# Patient Record
Sex: Male | Born: 1955
Health system: Southern US, Community
[De-identification: ages and names within clinical notes are randomized; demographics above are authoritative.]

## PROBLEM LIST (undated history)

## (undated) DIAGNOSIS — E785 Hyperlipidemia, unspecified: Secondary | ICD-10-CM

## (undated) DIAGNOSIS — F1020 Alcohol dependence, uncomplicated: Secondary | ICD-10-CM

## (undated) DIAGNOSIS — I10 Essential (primary) hypertension: Secondary | ICD-10-CM

## (undated) DIAGNOSIS — T8459XA Infection and inflammatory reaction due to other internal joint prosthesis, initial encounter: Secondary | ICD-10-CM

## (undated) DIAGNOSIS — I5022 Chronic systolic (congestive) heart failure: Secondary | ICD-10-CM

## (undated) DIAGNOSIS — M1990 Unspecified osteoarthritis, unspecified site: Secondary | ICD-10-CM

## (undated) DIAGNOSIS — Z96619 Presence of unspecified artificial shoulder joint: Secondary | ICD-10-CM

## (undated) DIAGNOSIS — J189 Pneumonia, unspecified organism: Secondary | ICD-10-CM

## (undated) DIAGNOSIS — H269 Unspecified cataract: Secondary | ICD-10-CM

## (undated) DIAGNOSIS — Z87442 Personal history of urinary calculi: Secondary | ICD-10-CM

## (undated) HISTORY — PX: COLONOSCOPY: SHX174

## (undated) HISTORY — PX: ROTATOR CUFF REPAIR: SHX139

## (undated) HISTORY — PX: TONSILLECTOMY: SUR1361

## (undated) HISTORY — PX: UMBILICAL HERNIA REPAIR: SHX196

## (undated) HISTORY — PX: TOTAL SHOULDER REPLACEMENT: SUR1217

## (undated) HISTORY — DX: Hyperlipidemia, unspecified: E78.5

## (undated) HISTORY — DX: Unspecified osteoarthritis, unspecified site: M19.90

## (undated) HISTORY — DX: Unspecified cataract: H26.9

---
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
## 1973-04-05 HISTORY — PX: KNEE SURGERY: SHX244

## 1988-04-05 HISTORY — PX: HERNIA REPAIR: SHX51

## 1997-12-06 ENCOUNTER — Encounter: Payer: Self-pay | Admitting: Emergency Medicine

## 1998-01-05 ENCOUNTER — Emergency Department (HOSPITAL_COMMUNITY): Admission: EM | Admit: 1998-01-05 | Discharge: 1998-01-05 | Payer: Self-pay | Admitting: Emergency Medicine

## 1998-01-05 ENCOUNTER — Encounter: Payer: Self-pay | Admitting: Emergency Medicine

## 2001-09-11 ENCOUNTER — Emergency Department (HOSPITAL_COMMUNITY): Admission: EM | Admit: 2001-09-11 | Discharge: 2001-09-11 | Payer: Self-pay

## 2005-10-25 ENCOUNTER — Ambulatory Visit: Payer: Self-pay | Admitting: Internal Medicine

## 2005-11-08 ENCOUNTER — Encounter (INDEPENDENT_AMBULATORY_CARE_PROVIDER_SITE_OTHER): Payer: Self-pay | Admitting: Specialist

## 2005-11-08 ENCOUNTER — Ambulatory Visit: Payer: Self-pay | Admitting: Internal Medicine

## 2008-07-16 ENCOUNTER — Ambulatory Visit: Payer: Self-pay | Admitting: Pulmonary Disease

## 2008-07-16 ENCOUNTER — Inpatient Hospital Stay (HOSPITAL_COMMUNITY): Admission: EM | Admit: 2008-07-16 | Discharge: 2008-08-09 | Payer: Self-pay | Admitting: Emergency Medicine

## 2009-12-30 IMAGING — CR DG CHEST 1V PORT
1 series · 1 of 1 positions shown · non-contrast
Comparison: 07/30/2008

CLINICAL DATA: Follow-up.

PORTABLE CHEST - 1 VIEW

[AP]
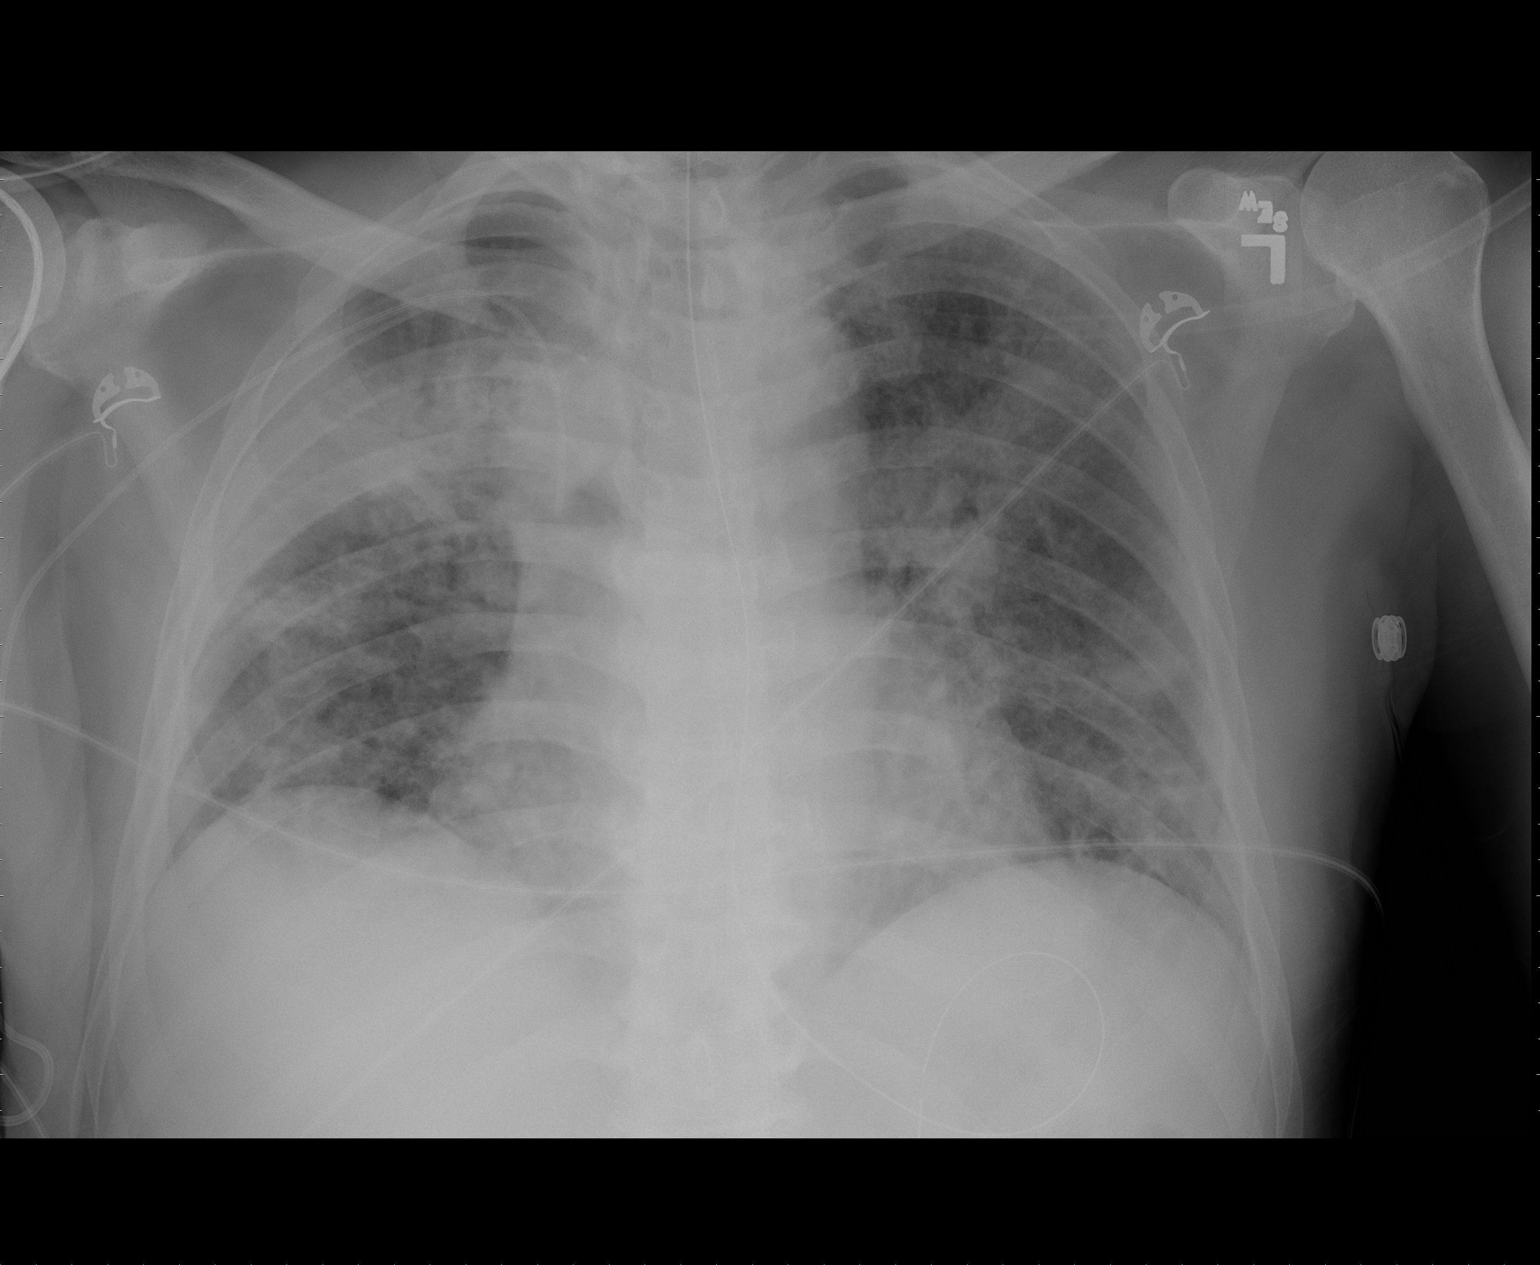

[1 of 1 positions shown; findings below may reference images not displayed]

FINDINGS: Patchy bilateral airspace disease, most pronounced in the
right upper lobe again noted, unchanged.  Interval extubation.
Heart is borderline in size.  No definite effusions.
IMPRESSION: Interval extubation.  No significant change bilateral airspace
disease.

## 2009-12-31 IMAGING — CR DG CHEST 1V PORT
1 series · 1 of 1 positions shown · non-contrast
Comparison: 07/31/2008

CLINICAL DATA: Severe C A P

PORTABLE CHEST - 1 VIEW

[AP]
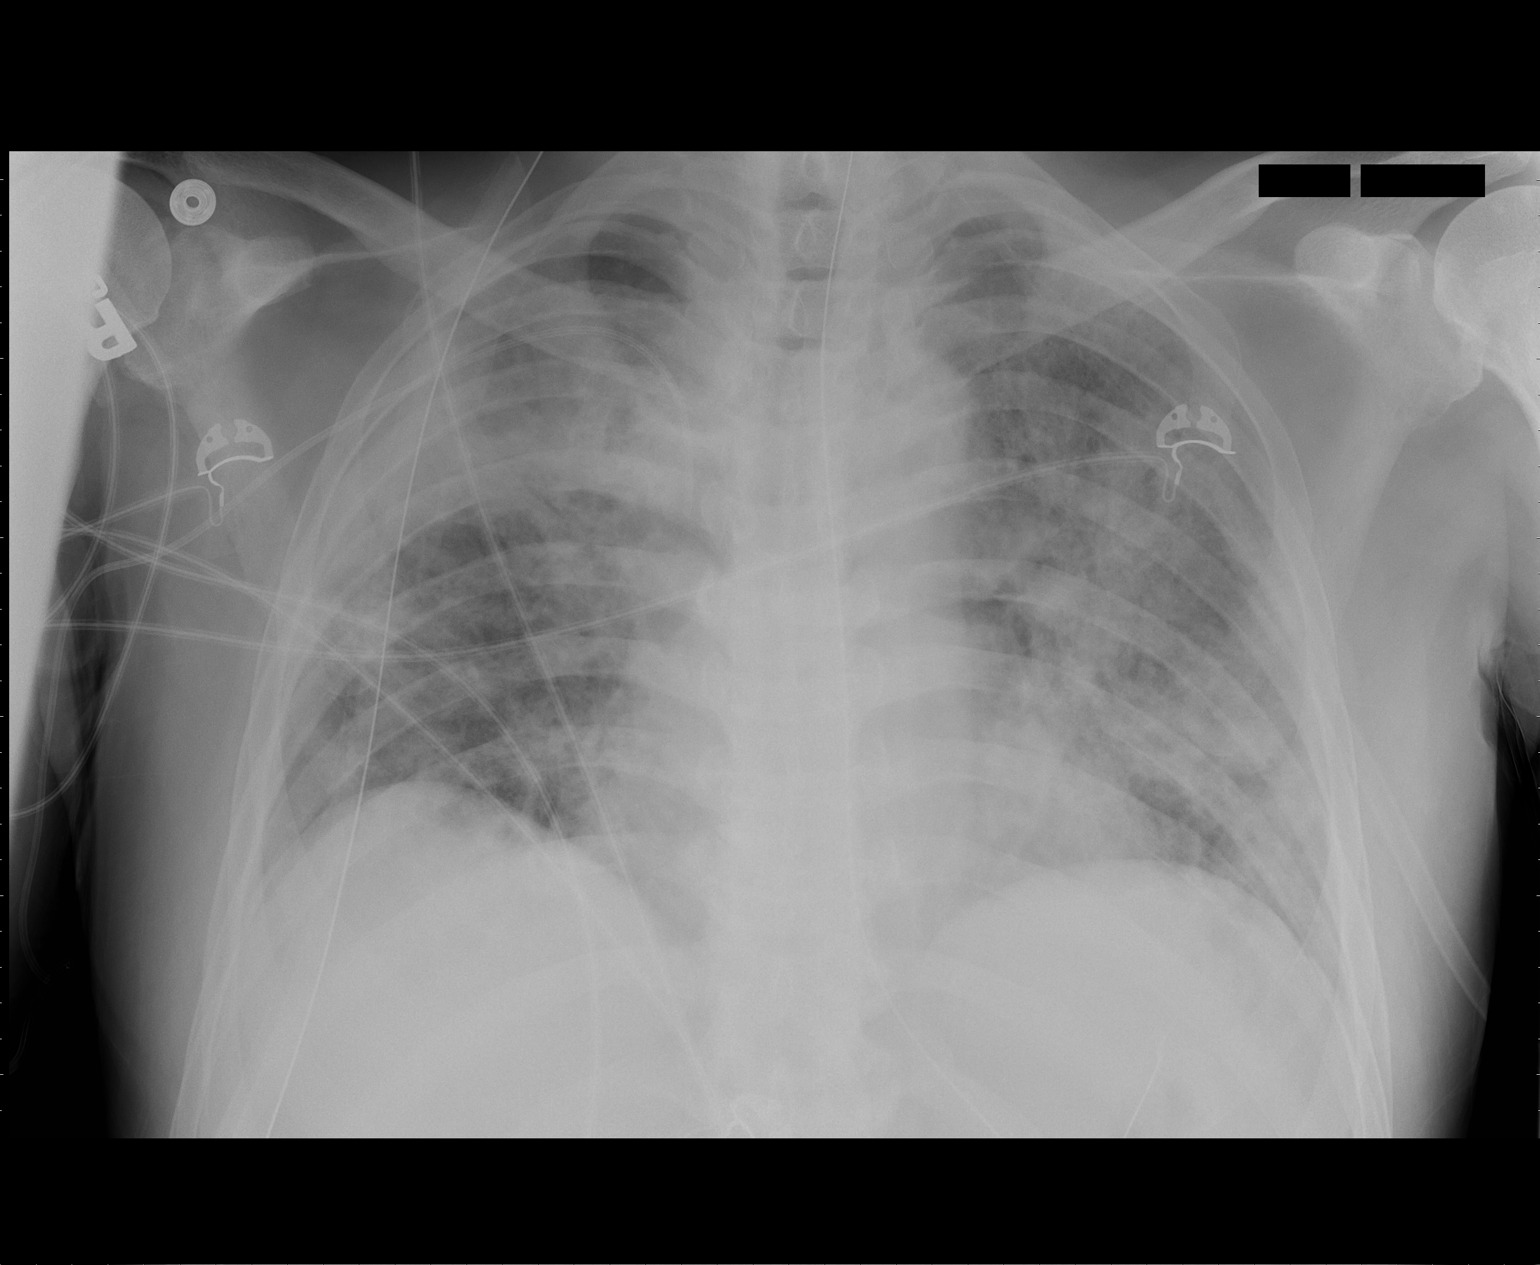

[1 of 1 positions shown; findings below may reference images not displayed]

FINDINGS: Patchy bilateral airspace disease again noted.  This is
worsened slightly in the left lung since prior study.  Stable on
the right.  Support devices are unchanged.  Borderline heart size.
IMPRESSION: Patchy bilateral airspace disease, worsening on the left.

## 2010-01-01 IMAGING — CR DG CHEST 1V PORT
1 series · 1 of 1 positions shown · non-contrast
Comparison: 08/01/2008

CLINICAL DATA: Extubation.

PORTABLE CHEST - 1 VIEW

[AP]
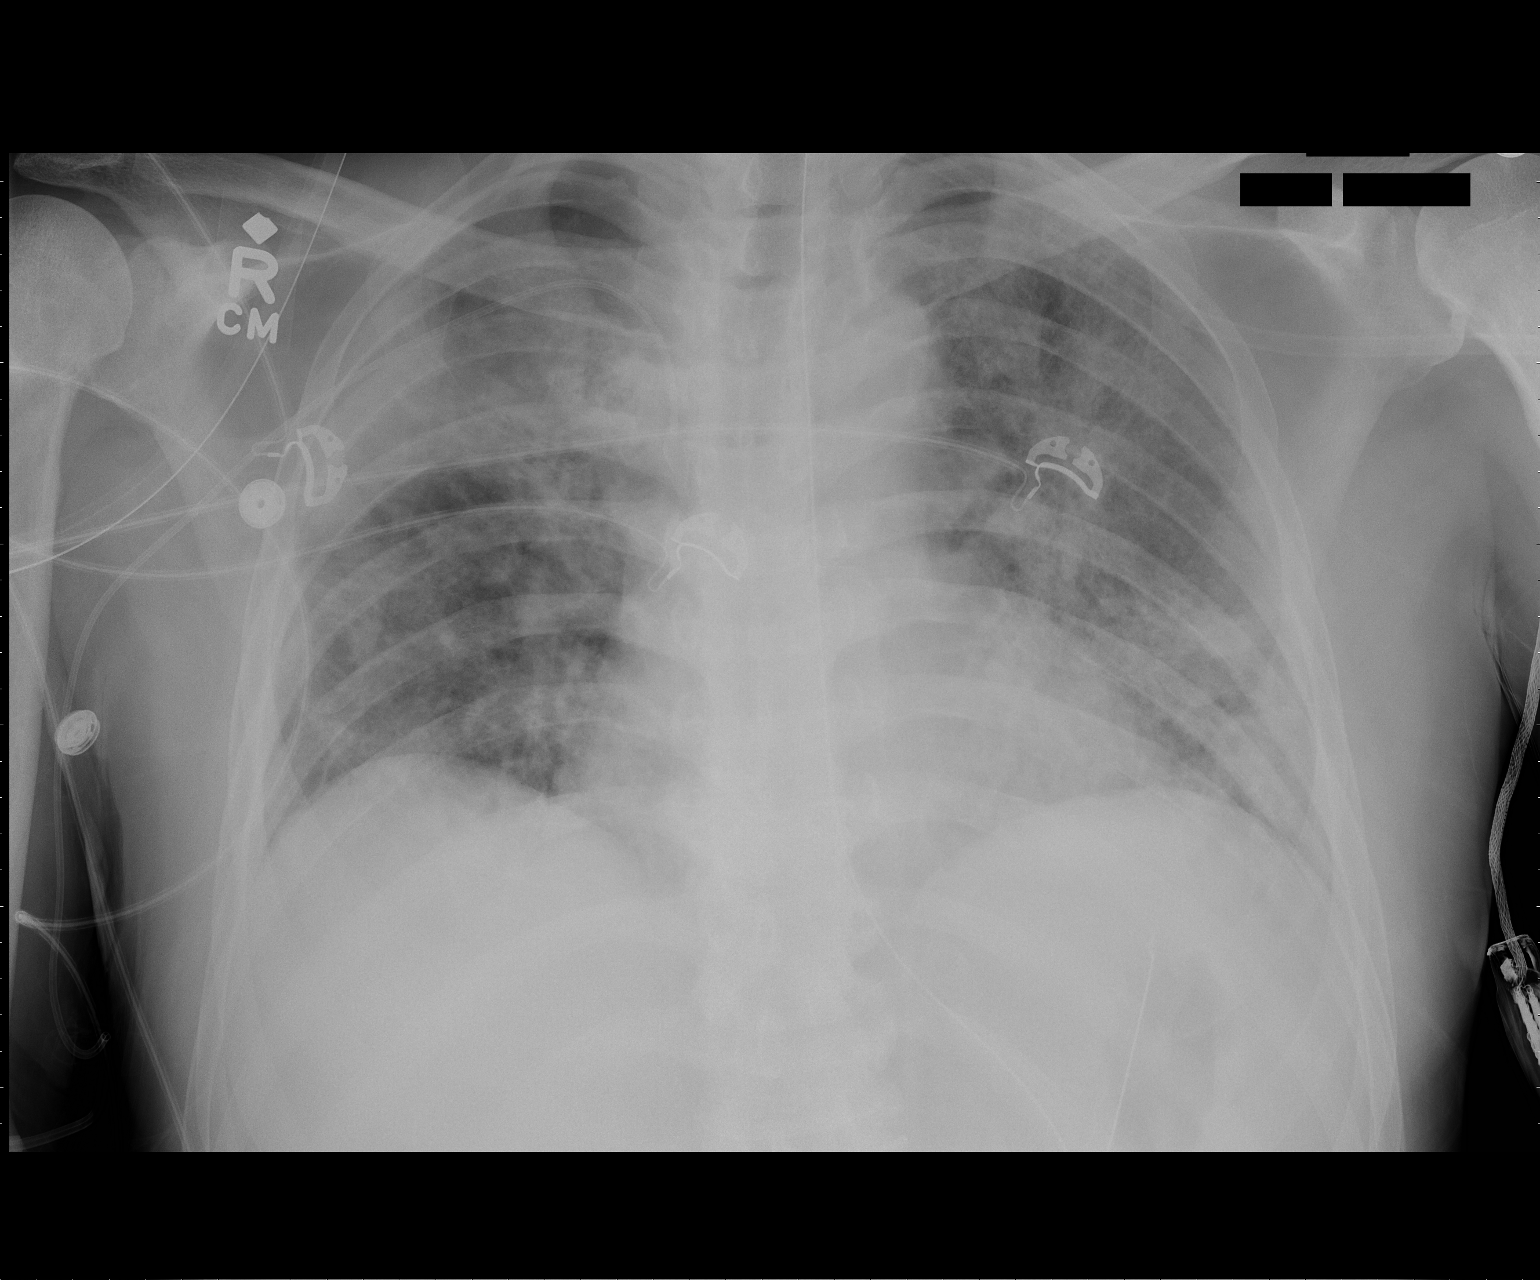

[1 of 1 positions shown; findings below may reference images not displayed]

FINDINGS: Right PICC line and NG tube are unchanged.  Patchy
bilateral airspace disease again noted, unchanged.  Heart is mildly
enlarged.  No definite effusions.
IMPRESSION: No significant change.

## 2010-01-03 IMAGING — CR DG CHEST 1V PORT
1 series · 1 of 1 positions shown · non-contrast
Comparison: 1 day prior

CLINICAL DATA: Respiratory distress.

PORTABLE CHEST - 1 VIEW

[AP]
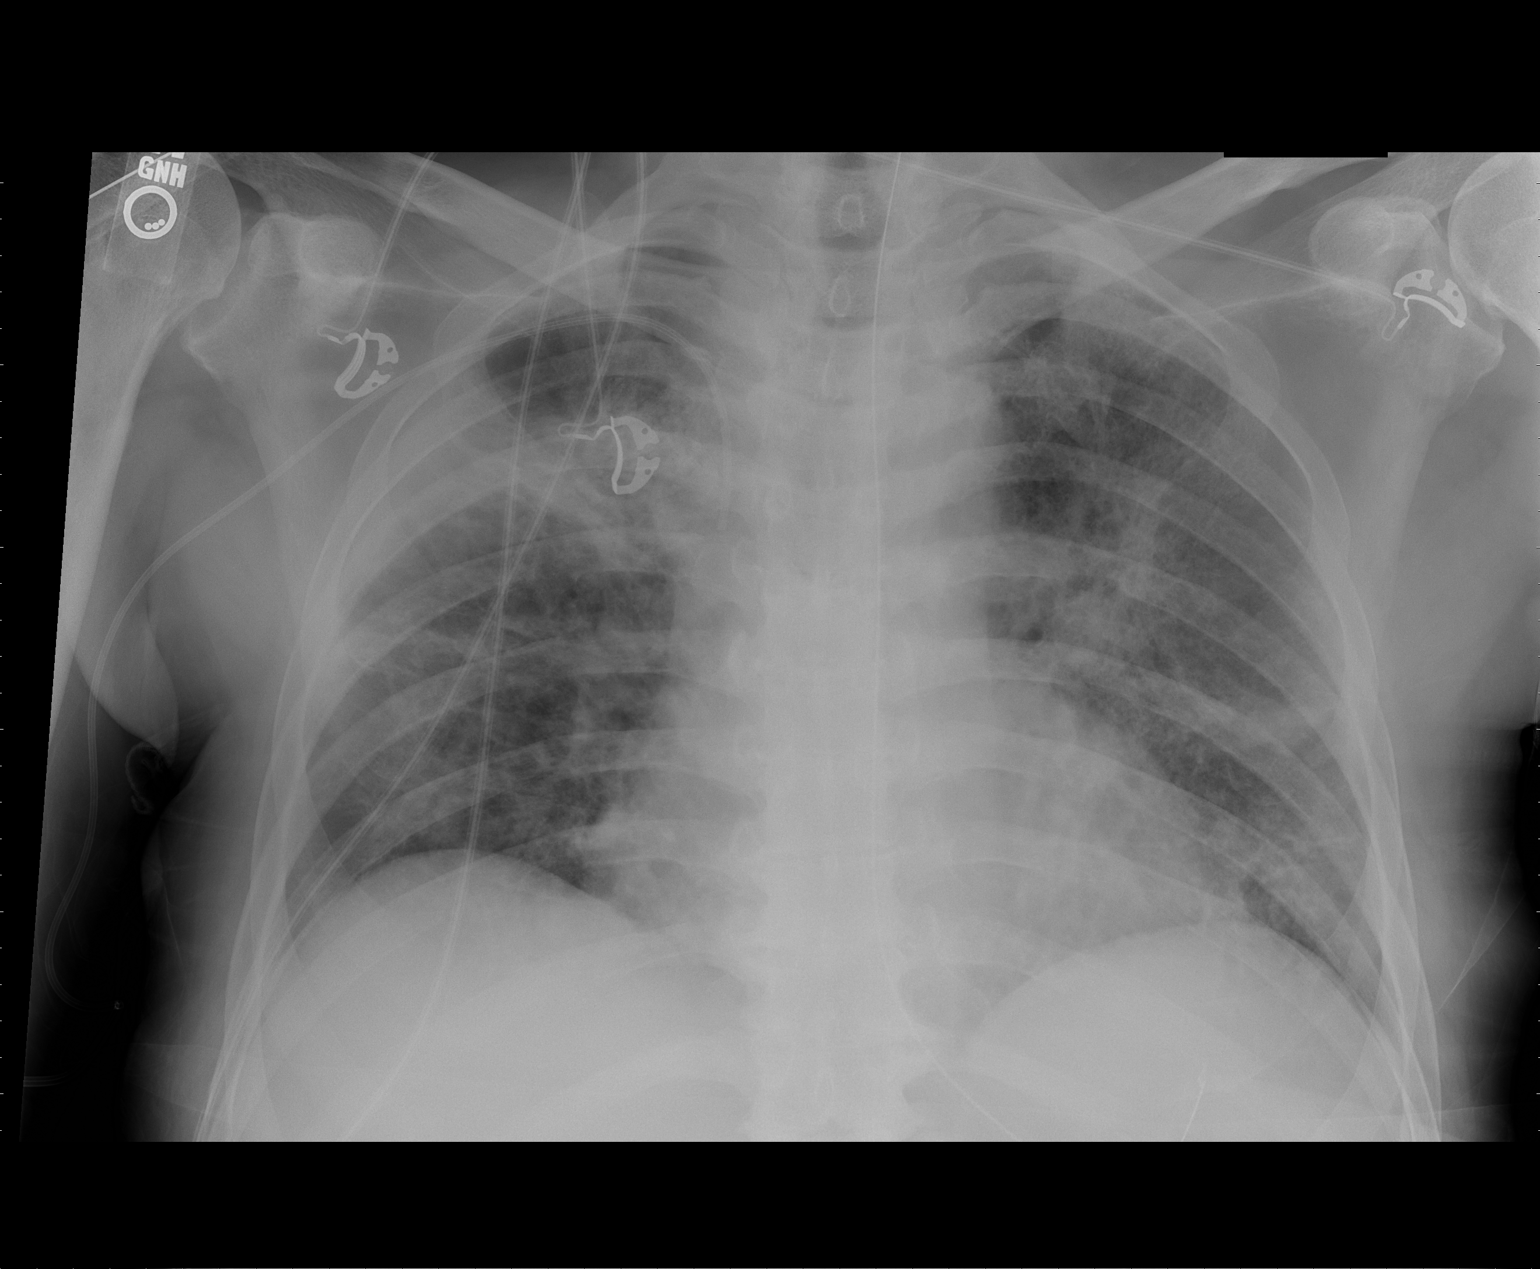

[1 of 1 positions shown; findings below may reference images not displayed]

FINDINGS: Nasogastric tube extends beyond the  inferior aspect of
the film.  Right-sided PICC line is unchanged with tip in mid SVC.

Midline trachea. Normal heart size for level of inspiration.  No
pleural effusion or pneumothorax. Lung volumes remain low.  Similar
to minimal improvement in the patchy bilateral airspace disease.
Greatest in the right upper lobe and left perihilar region.
IMPRESSION: Similar verses minimal improvement in bilateral airspace disease.
Considerations include infection, pulmonary edema, and ARDS.

## 2010-01-04 IMAGING — CR DG CHEST 1V PORT
1 series · 1 of 1 positions shown · non-contrast
Comparison: 08/04/2008

CLINICAL DATA: Respiratory distress

PORTABLE CHEST - 1 VIEW

[view not recorded]
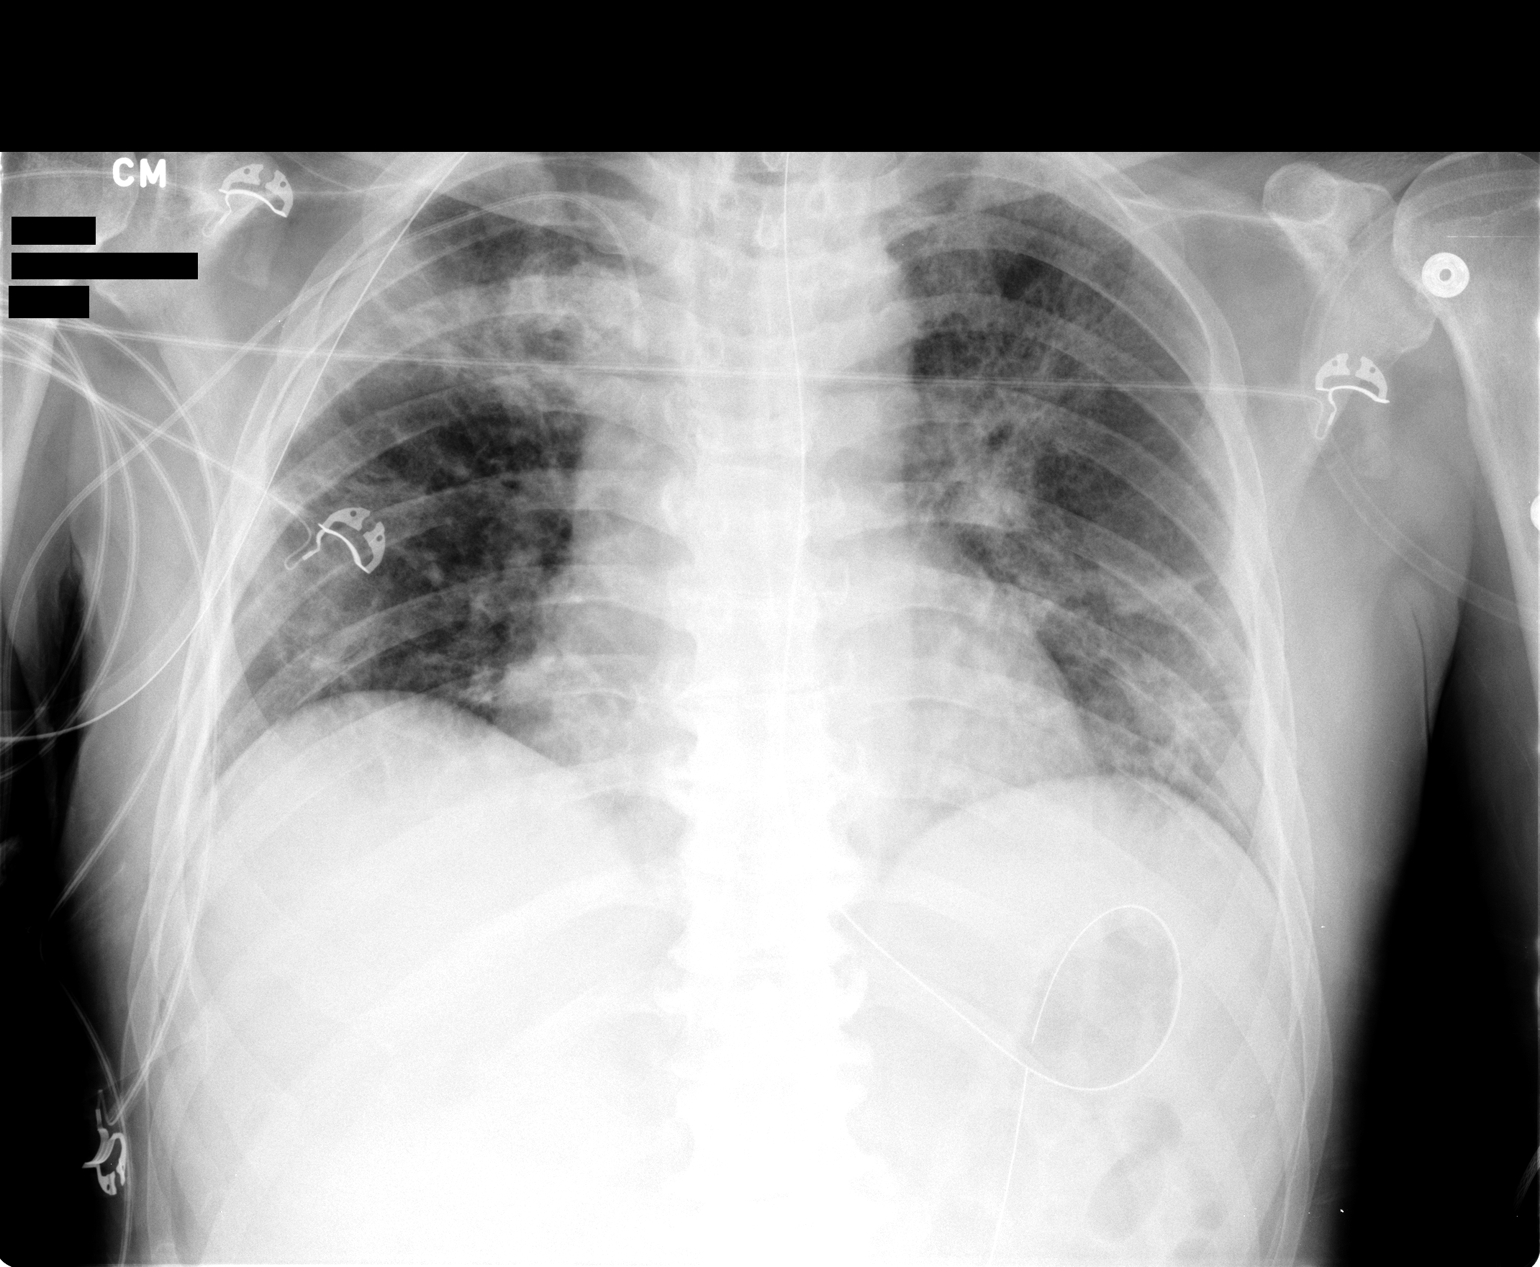

[1 of 1 positions shown; findings below may reference images not displayed]

FINDINGS: Heart size remains normal.  Patchy bilateral lung
densities without definite interval change.  Support apparatus
stable.
IMPRESSION: Patchy bilateral lung densities - no significant change.

## 2010-03-20 ENCOUNTER — Ambulatory Visit (HOSPITAL_COMMUNITY)
Admission: RE | Admit: 2010-03-20 | Discharge: 2010-03-20 | Payer: Self-pay | Source: Home / Self Care | Attending: General Surgery | Admitting: General Surgery

## 2010-06-16 LAB — CBC
HCT: 44.4 % (ref 39.0–52.0)
Hemoglobin: 15 g/dL (ref 13.0–17.0)
MCH: 35.6 pg — ABNORMAL HIGH (ref 26.0–34.0)
MCHC: 33.8 g/dL (ref 30.0–36.0)
MCV: 105.5 fL — ABNORMAL HIGH (ref 78.0–100.0)
Platelets: 148 10*3/uL — ABNORMAL LOW (ref 150–400)
RBC: 4.21 MIL/uL — ABNORMAL LOW (ref 4.22–5.81)
RDW: 12.5 % (ref 11.5–15.5)
WBC: 8.6 10*3/uL (ref 4.0–10.5)

## 2010-06-16 LAB — SURGICAL PCR SCREEN
MRSA, PCR: NEGATIVE
Staphylococcus aureus: POSITIVE — AB

## 2010-07-14 LAB — COMPREHENSIVE METABOLIC PANEL
ALT: 122 U/L — ABNORMAL HIGH (ref 0–53)
ALT: 162 U/L — ABNORMAL HIGH (ref 0–53)
AST: 46 U/L — ABNORMAL HIGH (ref 0–37)
AST: 46 U/L — ABNORMAL HIGH (ref 0–37)
Albumin: 2.6 g/dL — ABNORMAL LOW (ref 3.5–5.2)
Albumin: 2.7 g/dL — ABNORMAL LOW (ref 3.5–5.2)
Alkaline Phosphatase: 106 U/L (ref 39–117)
Alkaline Phosphatase: 120 U/L — ABNORMAL HIGH (ref 39–117)
BUN: 19 mg/dL (ref 6–23)
BUN: 24 mg/dL — ABNORMAL HIGH (ref 6–23)
CO2: 27 mEq/L (ref 19–32)
CO2: 28 mEq/L (ref 19–32)
Calcium: 9.3 mg/dL (ref 8.4–10.5)
Calcium: 9.8 mg/dL (ref 8.4–10.5)
Chloride: 103 mEq/L (ref 96–112)
Chloride: 103 mEq/L (ref 96–112)
Creatinine, Ser: 0.83 mg/dL (ref 0.4–1.5)
Creatinine, Ser: 0.87 mg/dL (ref 0.4–1.5)
GFR calc Af Amer: >60 mL/min (ref >60)
GFR calc Af Amer: >60 mL/min (ref >60)
GFR calc non Af Amer: >60 mL/min (ref >60)
GFR calc non Af Amer: >60 mL/min (ref >60)
Glucose, Bld: 104 mg/dL — ABNORMAL HIGH (ref 70–99)
Glucose, Bld: 130 mg/dL — ABNORMAL HIGH (ref 70–99)
Potassium: 3.6 mEq/L (ref 3.5–5.1)
Potassium: 4 mEq/L (ref 3.5–5.1)
Sodium: 138 mEq/L (ref 135–145)
Sodium: 140 mEq/L (ref 135–145)
Total Bilirubin: 0.7 mg/dL (ref 0.3–1.2)
Total Bilirubin: 0.8 mg/dL (ref 0.3–1.2)
Total Protein: 7.4 g/dL (ref 6.0–8.3)
Total Protein: 7.7 g/dL (ref 6.0–8.3)

## 2010-07-14 LAB — GLUCOSE, CAPILLARY
Glucose-Capillary: 108 mg/dL — ABNORMAL HIGH (ref 70–99)
Glucose-Capillary: 109 mg/dL — ABNORMAL HIGH (ref 70–99)
Glucose-Capillary: 113 mg/dL — ABNORMAL HIGH (ref 70–99)
Glucose-Capillary: 116 mg/dL — ABNORMAL HIGH (ref 70–99)
Glucose-Capillary: 118 mg/dL — ABNORMAL HIGH (ref 70–99)
Glucose-Capillary: 126 mg/dL — ABNORMAL HIGH (ref 70–99)
Glucose-Capillary: 127 mg/dL — ABNORMAL HIGH (ref 70–99)
Glucose-Capillary: 129 mg/dL — ABNORMAL HIGH (ref 70–99)
Glucose-Capillary: 129 mg/dL — ABNORMAL HIGH (ref 70–99)
Glucose-Capillary: 129 mg/dL — ABNORMAL HIGH (ref 70–99)
Glucose-Capillary: 129 mg/dL — ABNORMAL HIGH (ref 70–99)
Glucose-Capillary: 130 mg/dL — ABNORMAL HIGH (ref 70–99)
Glucose-Capillary: 131 mg/dL — ABNORMAL HIGH (ref 70–99)
Glucose-Capillary: 132 mg/dL — ABNORMAL HIGH (ref 70–99)
Glucose-Capillary: 132 mg/dL — ABNORMAL HIGH (ref 70–99)
Glucose-Capillary: 135 mg/dL — ABNORMAL HIGH (ref 70–99)
Glucose-Capillary: 136 mg/dL — ABNORMAL HIGH (ref 70–99)
Glucose-Capillary: 140 mg/dL — ABNORMAL HIGH (ref 70–99)
Glucose-Capillary: 142 mg/dL — ABNORMAL HIGH (ref 70–99)
Glucose-Capillary: 145 mg/dL — ABNORMAL HIGH (ref 70–99)
Glucose-Capillary: 146 mg/dL — ABNORMAL HIGH (ref 70–99)
Glucose-Capillary: 147 mg/dL — ABNORMAL HIGH (ref 70–99)
Glucose-Capillary: 154 mg/dL — ABNORMAL HIGH (ref 70–99)
Glucose-Capillary: 156 mg/dL — ABNORMAL HIGH (ref 70–99)
Glucose-Capillary: 158 mg/dL — ABNORMAL HIGH (ref 70–99)
Glucose-Capillary: 160 mg/dL — ABNORMAL HIGH (ref 70–99)
Glucose-Capillary: 69 mg/dL — ABNORMAL LOW (ref 70–99)

## 2010-07-14 LAB — CBC
HCT: 34.3 % — ABNORMAL LOW (ref 39.0–52.0)
HCT: 34.5 % — ABNORMAL LOW (ref 39.0–52.0)
HCT: 35.1 % — ABNORMAL LOW (ref 39.0–52.0)
HCT: 36.3 % — ABNORMAL LOW (ref 39.0–52.0)
Hemoglobin: 11.8 g/dL — ABNORMAL LOW (ref 13.0–17.0)
Hemoglobin: 12 g/dL — ABNORMAL LOW (ref 13.0–17.0)
Hemoglobin: 12.3 g/dL — ABNORMAL LOW (ref 13.0–17.0)
Hemoglobin: 12.6 g/dL — ABNORMAL LOW (ref 13.0–17.0)
MCHC: 34.3 g/dL (ref 30.0–36.0)
MCHC: 34.8 g/dL (ref 30.0–36.0)
MCHC: 35 g/dL (ref 30.0–36.0)
MCHC: 35.1 g/dL (ref 30.0–36.0)
MCV: 104.9 fL — ABNORMAL HIGH (ref 78.0–100.0)
MCV: 105.4 fL — ABNORMAL HIGH (ref 78.0–100.0)
MCV: 105.5 fL — ABNORMAL HIGH (ref 78.0–100.0)
MCV: 106.7 fL — ABNORMAL HIGH (ref 78.0–100.0)
Platelets: 360 10*3/uL (ref 150–400)
Platelets: 391 10*3/uL (ref 150–400)
Platelets: 452 10*3/uL — ABNORMAL HIGH (ref 150–400)
Platelets: 468 10*3/uL — ABNORMAL HIGH (ref 150–400)
RBC: 3.23 MIL/uL — ABNORMAL LOW (ref 4.22–5.81)
RBC: 3.25 MIL/uL — ABNORMAL LOW (ref 4.22–5.81)
RBC: 3.35 MIL/uL — ABNORMAL LOW (ref 4.22–5.81)
RBC: 3.44 MIL/uL — ABNORMAL LOW (ref 4.22–5.81)
RDW: 13.1 % (ref 11.5–15.5)
RDW: 13.4 % (ref 11.5–15.5)
RDW: 13.5 % (ref 11.5–15.5)
RDW: 13.7 % (ref 11.5–15.5)
WBC: 10.8 10*3/uL — ABNORMAL HIGH (ref 4.0–10.5)
WBC: 11.1 10*3/uL — ABNORMAL HIGH (ref 4.0–10.5)
WBC: 11.3 10*3/uL — ABNORMAL HIGH (ref 4.0–10.5)
WBC: 11.5 10*3/uL — ABNORMAL HIGH (ref 4.0–10.5)

## 2010-07-14 LAB — BASIC METABOLIC PANEL
BUN: 23 mg/dL (ref 6–23)
BUN: 33 mg/dL — ABNORMAL HIGH (ref 6–23)
CO2: 28 mEq/L (ref 19–32)
CO2: 29 mEq/L (ref 19–32)
Calcium: 9.5 mg/dL (ref 8.4–10.5)
Calcium: 9.5 mg/dL (ref 8.4–10.5)
Chloride: 106 mEq/L (ref 96–112)
Chloride: 98 mEq/L (ref 96–112)
Creatinine, Ser: 0.9 mg/dL (ref 0.4–1.5)
Creatinine, Ser: 1.02 mg/dL (ref 0.4–1.5)
GFR calc Af Amer: >60 mL/min (ref >60)
GFR calc Af Amer: >60 mL/min (ref >60)
GFR calc non Af Amer: >60 mL/min (ref >60)
GFR calc non Af Amer: >60 mL/min (ref >60)
Glucose, Bld: 134 mg/dL — ABNORMAL HIGH (ref 70–99)
Glucose, Bld: 150 mg/dL — ABNORMAL HIGH (ref 70–99)
Potassium: 3.7 mEq/L (ref 3.5–5.1)
Potassium: 4.5 mEq/L (ref 3.5–5.1)
Sodium: 136 mEq/L (ref 135–145)
Sodium: 139 mEq/L (ref 135–145)

## 2010-07-15 LAB — GLUCOSE, CAPILLARY
Glucose-Capillary: 100 mg/dL — ABNORMAL HIGH (ref 70–99)
Glucose-Capillary: 100 mg/dL — ABNORMAL HIGH (ref 70–99)
Glucose-Capillary: 101 mg/dL — ABNORMAL HIGH (ref 70–99)
Glucose-Capillary: 104 mg/dL — ABNORMAL HIGH (ref 70–99)
Glucose-Capillary: 105 mg/dL — ABNORMAL HIGH (ref 70–99)
Glucose-Capillary: 106 mg/dL — ABNORMAL HIGH (ref 70–99)
Glucose-Capillary: 107 mg/dL — ABNORMAL HIGH (ref 70–99)
Glucose-Capillary: 109 mg/dL — ABNORMAL HIGH (ref 70–99)
Glucose-Capillary: 110 mg/dL — ABNORMAL HIGH (ref 70–99)
Glucose-Capillary: 111 mg/dL — ABNORMAL HIGH (ref 70–99)
Glucose-Capillary: 111 mg/dL — ABNORMAL HIGH (ref 70–99)
Glucose-Capillary: 113 mg/dL — ABNORMAL HIGH (ref 70–99)
Glucose-Capillary: 113 mg/dL — ABNORMAL HIGH (ref 70–99)
Glucose-Capillary: 114 mg/dL — ABNORMAL HIGH (ref 70–99)
Glucose-Capillary: 114 mg/dL — ABNORMAL HIGH (ref 70–99)
Glucose-Capillary: 115 mg/dL — ABNORMAL HIGH (ref 70–99)
Glucose-Capillary: 115 mg/dL — ABNORMAL HIGH (ref 70–99)
Glucose-Capillary: 117 mg/dL — ABNORMAL HIGH (ref 70–99)
Glucose-Capillary: 117 mg/dL — ABNORMAL HIGH (ref 70–99)
Glucose-Capillary: 118 mg/dL — ABNORMAL HIGH (ref 70–99)
Glucose-Capillary: 119 mg/dL — ABNORMAL HIGH (ref 70–99)
Glucose-Capillary: 121 mg/dL — ABNORMAL HIGH (ref 70–99)
Glucose-Capillary: 122 mg/dL — ABNORMAL HIGH (ref 70–99)
Glucose-Capillary: 122 mg/dL — ABNORMAL HIGH (ref 70–99)
Glucose-Capillary: 122 mg/dL — ABNORMAL HIGH (ref 70–99)
Glucose-Capillary: 123 mg/dL — ABNORMAL HIGH (ref 70–99)
Glucose-Capillary: 123 mg/dL — ABNORMAL HIGH (ref 70–99)
Glucose-Capillary: 123 mg/dL — ABNORMAL HIGH (ref 70–99)
Glucose-Capillary: 124 mg/dL — ABNORMAL HIGH (ref 70–99)
Glucose-Capillary: 124 mg/dL — ABNORMAL HIGH (ref 70–99)
Glucose-Capillary: 125 mg/dL — ABNORMAL HIGH (ref 70–99)
Glucose-Capillary: 126 mg/dL — ABNORMAL HIGH (ref 70–99)
Glucose-Capillary: 126 mg/dL — ABNORMAL HIGH (ref 70–99)
Glucose-Capillary: 128 mg/dL — ABNORMAL HIGH (ref 70–99)
Glucose-Capillary: 129 mg/dL — ABNORMAL HIGH (ref 70–99)
Glucose-Capillary: 129 mg/dL — ABNORMAL HIGH (ref 70–99)
Glucose-Capillary: 129 mg/dL — ABNORMAL HIGH (ref 70–99)
Glucose-Capillary: 129 mg/dL — ABNORMAL HIGH (ref 70–99)
Glucose-Capillary: 130 mg/dL — ABNORMAL HIGH (ref 70–99)
Glucose-Capillary: 130 mg/dL — ABNORMAL HIGH (ref 70–99)
Glucose-Capillary: 130 mg/dL — ABNORMAL HIGH (ref 70–99)
Glucose-Capillary: 130 mg/dL — ABNORMAL HIGH (ref 70–99)
Glucose-Capillary: 131 mg/dL — ABNORMAL HIGH (ref 70–99)
Glucose-Capillary: 133 mg/dL — ABNORMAL HIGH (ref 70–99)
Glucose-Capillary: 133 mg/dL — ABNORMAL HIGH (ref 70–99)
Glucose-Capillary: 133 mg/dL — ABNORMAL HIGH (ref 70–99)
Glucose-Capillary: 134 mg/dL — ABNORMAL HIGH (ref 70–99)
Glucose-Capillary: 135 mg/dL — ABNORMAL HIGH (ref 70–99)
Glucose-Capillary: 135 mg/dL — ABNORMAL HIGH (ref 70–99)
Glucose-Capillary: 135 mg/dL — ABNORMAL HIGH (ref 70–99)
Glucose-Capillary: 135 mg/dL — ABNORMAL HIGH (ref 70–99)
Glucose-Capillary: 136 mg/dL — ABNORMAL HIGH (ref 70–99)
Glucose-Capillary: 137 mg/dL — ABNORMAL HIGH (ref 70–99)
Glucose-Capillary: 137 mg/dL — ABNORMAL HIGH (ref 70–99)
Glucose-Capillary: 138 mg/dL — ABNORMAL HIGH (ref 70–99)
Glucose-Capillary: 139 mg/dL — ABNORMAL HIGH (ref 70–99)
Glucose-Capillary: 140 mg/dL — ABNORMAL HIGH (ref 70–99)
Glucose-Capillary: 141 mg/dL — ABNORMAL HIGH (ref 70–99)
Glucose-Capillary: 142 mg/dL — ABNORMAL HIGH (ref 70–99)
Glucose-Capillary: 143 mg/dL — ABNORMAL HIGH (ref 70–99)
Glucose-Capillary: 144 mg/dL — ABNORMAL HIGH (ref 70–99)
Glucose-Capillary: 154 mg/dL — ABNORMAL HIGH (ref 70–99)
Glucose-Capillary: 154 mg/dL — ABNORMAL HIGH (ref 70–99)
Glucose-Capillary: 156 mg/dL — ABNORMAL HIGH (ref 70–99)
Glucose-Capillary: 157 mg/dL — ABNORMAL HIGH (ref 70–99)
Glucose-Capillary: 158 mg/dL — ABNORMAL HIGH (ref 70–99)
Glucose-Capillary: 160 mg/dL — ABNORMAL HIGH (ref 70–99)
Glucose-Capillary: 161 mg/dL — ABNORMAL HIGH (ref 70–99)
Glucose-Capillary: 161 mg/dL — ABNORMAL HIGH (ref 70–99)
Glucose-Capillary: 165 mg/dL — ABNORMAL HIGH (ref 70–99)
Glucose-Capillary: 170 mg/dL — ABNORMAL HIGH (ref 70–99)
Glucose-Capillary: 172 mg/dL — ABNORMAL HIGH (ref 70–99)
Glucose-Capillary: 185 mg/dL — ABNORMAL HIGH (ref 70–99)
Glucose-Capillary: 201 mg/dL — ABNORMAL HIGH (ref 70–99)
Glucose-Capillary: 61 mg/dL — ABNORMAL LOW (ref 70–99)
Glucose-Capillary: 81 mg/dL (ref 70–99)
Glucose-Capillary: 85 mg/dL (ref 70–99)
Glucose-Capillary: 85 mg/dL (ref 70–99)
Glucose-Capillary: 88 mg/dL (ref 70–99)
Glucose-Capillary: 91 mg/dL (ref 70–99)
Glucose-Capillary: 91 mg/dL (ref 70–99)
Glucose-Capillary: 92 mg/dL (ref 70–99)
Glucose-Capillary: 92 mg/dL (ref 70–99)
Glucose-Capillary: 94 mg/dL (ref 70–99)
Glucose-Capillary: 95 mg/dL (ref 70–99)
Glucose-Capillary: 97 mg/dL (ref 70–99)
Glucose-Capillary: 97 mg/dL (ref 70–99)
Glucose-Capillary: 97 mg/dL (ref 70–99)
Glucose-Capillary: 98 mg/dL (ref 70–99)
Glucose-Capillary: 99 mg/dL (ref 70–99)
Glucose-Capillary: <10 mg/dL — CL (ref 70–99)
Glucose-Capillary: 125 mg/dL — ABNORMAL HIGH (ref 70–99)
Glucose-Capillary: 130 mg/dL — ABNORMAL HIGH (ref 70–99)
Glucose-Capillary: 149 mg/dL — ABNORMAL HIGH (ref 70–99)
Glucose-Capillary: 150 mg/dL — ABNORMAL HIGH (ref 70–99)
Glucose-Capillary: 153 mg/dL — ABNORMAL HIGH (ref 70–99)
Glucose-Capillary: 166 mg/dL — ABNORMAL HIGH (ref 70–99)
Glucose-Capillary: 166 mg/dL — ABNORMAL HIGH (ref 70–99)
Glucose-Capillary: 172 mg/dL — ABNORMAL HIGH (ref 70–99)
Glucose-Capillary: 201 mg/dL — ABNORMAL HIGH (ref 70–99)
Glucose-Capillary: 207 mg/dL — ABNORMAL HIGH (ref 70–99)
Glucose-Capillary: 73 mg/dL (ref 70–99)

## 2010-07-15 LAB — DIFFERENTIAL
Band Neutrophils: 0 % (ref 0–10)
Band Neutrophils: 0 % (ref 0–10)
Basophils Absolute: 0 10*3/uL (ref 0.0–0.1)
Basophils Absolute: 0 10*3/uL (ref 0.0–0.1)
Basophils Absolute: 0 10*3/uL (ref 0.0–0.1)
Basophils Absolute: 0 10*3/uL (ref 0.0–0.1)
Basophils Absolute: 0 10*3/uL (ref 0.0–0.1)
Basophils Absolute: 0 10*3/uL (ref 0.0–0.1)
Basophils Relative: 0 % (ref 0–1)
Basophils Relative: 0 % (ref 0–1)
Basophils Relative: 0 % (ref 0–1)
Basophils Relative: 0 % (ref 0–1)
Basophils Relative: 0 % (ref 0–1)
Basophils Relative: 0 % (ref 0–1)
Blasts: 0 %
Blasts: 0 %
Eosinophils Absolute: 0 10*3/uL (ref 0.0–0.7)
Eosinophils Absolute: 0 10*3/uL (ref 0.0–0.7)
Eosinophils Absolute: 0 10*3/uL (ref 0.0–0.7)
Eosinophils Absolute: 0 10*3/uL (ref 0.0–0.7)
Eosinophils Absolute: 0 10*3/uL (ref 0.0–0.7)
Eosinophils Absolute: 0 10*3/uL (ref 0.0–0.7)
Eosinophils Relative: 0 % (ref 0–5)
Eosinophils Relative: 0 % (ref 0–5)
Eosinophils Relative: 0 % (ref 0–5)
Eosinophils Relative: 0 % (ref 0–5)
Eosinophils Relative: 0 % (ref 0–5)
Eosinophils Relative: 0 % (ref 0–5)
Lymphocytes Relative: 7 % — ABNORMAL LOW (ref 12–46)
Lymphocytes Relative: 8 % — ABNORMAL LOW (ref 12–46)
Lymphocytes Relative: 3 % — ABNORMAL LOW (ref 12–46)
Lymphocytes Relative: 3 % — ABNORMAL LOW (ref 12–46)
Lymphocytes Relative: 3 % — ABNORMAL LOW (ref 12–46)
Lymphocytes Relative: 4 % — ABNORMAL LOW (ref 12–46)
Lymphs Abs: 0.6 10*3/uL — ABNORMAL LOW (ref 0.7–4.0)
Lymphs Abs: 1.3 10*3/uL (ref 0.7–4.0)
Lymphs Abs: 0.2 10*3/uL — ABNORMAL LOW (ref 0.7–4.0)
Lymphs Abs: 0.4 10*3/uL — ABNORMAL LOW (ref 0.7–4.0)
Lymphs Abs: 0.5 10*3/uL — ABNORMAL LOW (ref 0.7–4.0)
Lymphs Abs: 0.6 10*3/uL — ABNORMAL LOW (ref 0.7–4.0)
Metamyelocytes Relative: 0 %
Metamyelocytes Relative: 0 %
Monocytes Absolute: 0 10*3/uL — ABNORMAL LOW (ref 0.1–1.0)
Monocytes Absolute: 0.2 10*3/uL (ref 0.1–1.0)
Monocytes Absolute: 0.1 10*3/uL (ref 0.1–1.0)
Monocytes Absolute: 0.2 10*3/uL (ref 0.1–1.0)
Monocytes Absolute: 0.8 10*3/uL (ref 0.1–1.0)
Monocytes Absolute: 1.6 10*3/uL — ABNORMAL HIGH (ref 0.1–1.0)
Monocytes Relative: 0 % — ABNORMAL LOW (ref 3–12)
Monocytes Relative: 3 % (ref 3–12)
Monocytes Relative: 1 % — ABNORMAL LOW (ref 3–12)
Monocytes Relative: 3 % (ref 3–12)
Monocytes Relative: 5 % (ref 3–12)
Monocytes Relative: 8 % (ref 3–12)
Myelocytes: 0 %
Myelocytes: 0 %
Neutro Abs: 17.5 10*3/uL — ABNORMAL HIGH (ref 1.7–7.7)
Neutro Abs: 6.3 10*3/uL (ref 1.7–7.7)
Neutro Abs: 15.4 10*3/uL — ABNORMAL HIGH (ref 1.7–7.7)
Neutro Abs: 18.2 10*3/uL — ABNORMAL HIGH (ref 1.7–7.7)
Neutro Abs: 5.9 10*3/uL (ref 1.7–7.7)
Neutro Abs: 9.1 10*3/uL — ABNORMAL HIGH (ref 1.7–7.7)
Neutrophils Relative %: 88 % — ABNORMAL HIGH (ref 43–77)
Neutrophils Relative %: 93 % — ABNORMAL HIGH (ref 43–77)
Neutrophils Relative %: 89 % — ABNORMAL HIGH (ref 43–77)
Neutrophils Relative %: 92 % — ABNORMAL HIGH (ref 43–77)
Neutrophils Relative %: 94 % — ABNORMAL HIGH (ref 43–77)
Neutrophils Relative %: 95 % — ABNORMAL HIGH (ref 43–77)
Promyelocytes Absolute: 0 %
Promyelocytes Absolute: 0 %
nRBC: 0 /100 WBC
nRBC: 0 /100 WBC

## 2010-07-15 LAB — COMPREHENSIVE METABOLIC PANEL
ALT: 101 U/L — ABNORMAL HIGH (ref 0–53)
ALT: 126 U/L — ABNORMAL HIGH (ref 0–53)
ALT: 249 U/L — ABNORMAL HIGH (ref 0–53)
ALT: 279 U/L — ABNORMAL HIGH (ref 0–53)
ALT: 48 U/L (ref 0–53)
ALT: 48 U/L (ref 0–53)
ALT: 54 U/L — ABNORMAL HIGH (ref 0–53)
ALT: 60 U/L — ABNORMAL HIGH (ref 0–53)
ALT: 72 U/L — ABNORMAL HIGH (ref 0–53)
ALT: 76 U/L — ABNORMAL HIGH (ref 0–53)
ALT: 97 U/L — ABNORMAL HIGH (ref 0–53)
ALT: 17 U/L (ref 0–53)
ALT: 37 U/L (ref 0–53)
AST: 108 U/L — ABNORMAL HIGH (ref 0–37)
AST: 113 U/L — ABNORMAL HIGH (ref 0–37)
AST: 209 U/L — ABNORMAL HIGH (ref 0–37)
AST: 41 U/L — ABNORMAL HIGH (ref 0–37)
AST: 46 U/L — ABNORMAL HIGH (ref 0–37)
AST: 46 U/L — ABNORMAL HIGH (ref 0–37)
AST: 52 U/L — ABNORMAL HIGH (ref 0–37)
AST: 68 U/L — ABNORMAL HIGH (ref 0–37)
AST: 81 U/L — ABNORMAL HIGH (ref 0–37)
AST: 90 U/L — ABNORMAL HIGH (ref 0–37)
AST: 98 U/L — ABNORMAL HIGH (ref 0–37)
AST: 27 U/L (ref 0–37)
AST: 67 U/L — ABNORMAL HIGH (ref 0–37)
Albumin: 1.4 g/dL — ABNORMAL LOW (ref 3.5–5.2)
Albumin: 1.4 g/dL — ABNORMAL LOW (ref 3.5–5.2)
Albumin: 1.4 g/dL — ABNORMAL LOW (ref 3.5–5.2)
Albumin: 1.5 g/dL — ABNORMAL LOW (ref 3.5–5.2)
Albumin: 1.5 g/dL — ABNORMAL LOW (ref 3.5–5.2)
Albumin: 1.6 g/dL — ABNORMAL LOW (ref 3.5–5.2)
Albumin: 1.6 g/dL — ABNORMAL LOW (ref 3.5–5.2)
Albumin: 1.6 g/dL — ABNORMAL LOW (ref 3.5–5.2)
Albumin: 1.6 g/dL — ABNORMAL LOW (ref 3.5–5.2)
Albumin: 1.8 g/dL — ABNORMAL LOW (ref 3.5–5.2)
Albumin: 2.1 g/dL — ABNORMAL LOW (ref 3.5–5.2)
Albumin: 1.7 g/dL — ABNORMAL LOW (ref 3.5–5.2)
Albumin: 2.6 g/dL — ABNORMAL LOW (ref 3.5–5.2)
Alkaline Phosphatase: 111 U/L (ref 39–117)
Alkaline Phosphatase: 115 U/L (ref 39–117)
Alkaline Phosphatase: 123 U/L — ABNORMAL HIGH (ref 39–117)
Alkaline Phosphatase: 131 U/L — ABNORMAL HIGH (ref 39–117)
Alkaline Phosphatase: 136 U/L — ABNORMAL HIGH (ref 39–117)
Alkaline Phosphatase: 138 U/L — ABNORMAL HIGH (ref 39–117)
Alkaline Phosphatase: 146 U/L — ABNORMAL HIGH (ref 39–117)
Alkaline Phosphatase: 166 U/L — ABNORMAL HIGH (ref 39–117)
Alkaline Phosphatase: 170 U/L — ABNORMAL HIGH (ref 39–117)
Alkaline Phosphatase: 180 U/L — ABNORMAL HIGH (ref 39–117)
Alkaline Phosphatase: 185 U/L — ABNORMAL HIGH (ref 39–117)
Alkaline Phosphatase: 45 U/L (ref 39–117)
Alkaline Phosphatase: 65 U/L (ref 39–117)
BUN: 10 mg/dL (ref 6–23)
BUN: 13 mg/dL (ref 6–23)
BUN: 14 mg/dL (ref 6–23)
BUN: 14 mg/dL (ref 6–23)
BUN: 16 mg/dL (ref 6–23)
BUN: 18 mg/dL (ref 6–23)
BUN: 21 mg/dL (ref 6–23)
BUN: 23 mg/dL (ref 6–23)
BUN: 24 mg/dL — ABNORMAL HIGH (ref 6–23)
BUN: 26 mg/dL — ABNORMAL HIGH (ref 6–23)
BUN: 8 mg/dL (ref 6–23)
BUN: 30 mg/dL — ABNORMAL HIGH (ref 6–23)
BUN: 32 mg/dL — ABNORMAL HIGH (ref 6–23)
CO2: 24 mEq/L (ref 19–32)
CO2: 26 mEq/L (ref 19–32)
CO2: 28 mEq/L (ref 19–32)
CO2: 28 mEq/L (ref 19–32)
CO2: 30 mEq/L (ref 19–32)
CO2: 30 mEq/L (ref 19–32)
CO2: 30 mEq/L (ref 19–32)
CO2: 31 mEq/L (ref 19–32)
CO2: 31 mEq/L (ref 19–32)
CO2: 32 mEq/L (ref 19–32)
CO2: 36 mEq/L — ABNORMAL HIGH (ref 19–32)
CO2: 22 mEq/L (ref 19–32)
CO2: 22 mEq/L (ref 19–32)
Calcium: 7.5 mg/dL — ABNORMAL LOW (ref 8.4–10.5)
Calcium: 7.8 mg/dL — ABNORMAL LOW (ref 8.4–10.5)
Calcium: 8.1 mg/dL — ABNORMAL LOW (ref 8.4–10.5)
Calcium: 8.1 mg/dL — ABNORMAL LOW (ref 8.4–10.5)
Calcium: 8.2 mg/dL — ABNORMAL LOW (ref 8.4–10.5)
Calcium: 8.2 mg/dL — ABNORMAL LOW (ref 8.4–10.5)
Calcium: 8.3 mg/dL — ABNORMAL LOW (ref 8.4–10.5)
Calcium: 8.3 mg/dL — ABNORMAL LOW (ref 8.4–10.5)
Calcium: 8.4 mg/dL (ref 8.4–10.5)
Calcium: 8.6 mg/dL (ref 8.4–10.5)
Calcium: 9 mg/dL (ref 8.4–10.5)
Calcium: 7.7 mg/dL — ABNORMAL LOW (ref 8.4–10.5)
Calcium: 8.2 mg/dL — ABNORMAL LOW (ref 8.4–10.5)
Chloride: 100 mEq/L (ref 96–112)
Chloride: 100 mEq/L (ref 96–112)
Chloride: 101 mEq/L (ref 96–112)
Chloride: 103 mEq/L (ref 96–112)
Chloride: 106 mEq/L (ref 96–112)
Chloride: 107 mEq/L (ref 96–112)
Chloride: 107 mEq/L (ref 96–112)
Chloride: 109 mEq/L (ref 96–112)
Chloride: 111 mEq/L (ref 96–112)
Chloride: 98 mEq/L (ref 96–112)
Chloride: 98 mEq/L (ref 96–112)
Chloride: 107 mEq/L (ref 96–112)
Chloride: 94 mEq/L — ABNORMAL LOW (ref 96–112)
Creatinine, Ser: 0.64 mg/dL (ref 0.4–1.5)
Creatinine, Ser: 0.67 mg/dL (ref 0.4–1.5)
Creatinine, Ser: 0.73 mg/dL (ref 0.4–1.5)
Creatinine, Ser: 0.75 mg/dL (ref 0.4–1.5)
Creatinine, Ser: 0.77 mg/dL (ref 0.4–1.5)
Creatinine, Ser: 0.79 mg/dL (ref 0.4–1.5)
Creatinine, Ser: 0.8 mg/dL (ref 0.4–1.5)
Creatinine, Ser: 0.8 mg/dL (ref 0.4–1.5)
Creatinine, Ser: 0.92 mg/dL (ref 0.4–1.5)
Creatinine, Ser: 0.95 mg/dL (ref 0.4–1.5)
Creatinine, Ser: 0.99 mg/dL (ref 0.4–1.5)
Creatinine, Ser: 1.17 mg/dL (ref 0.4–1.5)
Creatinine, Ser: 1.38 mg/dL (ref 0.4–1.5)
GFR calc Af Amer: >60 mL/min (ref >60)
GFR calc Af Amer: >60 mL/min (ref >60)
GFR calc Af Amer: >60 mL/min (ref >60)
GFR calc Af Amer: >60 mL/min (ref >60)
GFR calc Af Amer: >60 mL/min (ref >60)
GFR calc Af Amer: >60 mL/min (ref >60)
GFR calc Af Amer: >60 mL/min (ref >60)
GFR calc Af Amer: >60 mL/min (ref >60)
GFR calc Af Amer: >60 mL/min (ref >60)
GFR calc Af Amer: >60 mL/min (ref >60)
GFR calc Af Amer: >60 mL/min (ref >60)
GFR calc Af Amer: >60 mL/min (ref >60)
GFR calc Af Amer: >60 mL/min (ref >60)
GFR calc non Af Amer: >60 mL/min (ref >60)
GFR calc non Af Amer: >60 mL/min (ref >60)
GFR calc non Af Amer: >60 mL/min (ref >60)
GFR calc non Af Amer: >60 mL/min (ref >60)
GFR calc non Af Amer: >60 mL/min (ref >60)
GFR calc non Af Amer: >60 mL/min (ref >60)
GFR calc non Af Amer: >60 mL/min (ref >60)
GFR calc non Af Amer: >60 mL/min (ref >60)
GFR calc non Af Amer: >60 mL/min (ref >60)
GFR calc non Af Amer: >60 mL/min (ref >60)
GFR calc non Af Amer: >60 mL/min (ref >60)
GFR calc non Af Amer: 53 mL/min — ABNORMAL LOW (ref >60)
GFR calc non Af Amer: >60 mL/min (ref >60)
Glucose, Bld: 104 mg/dL — ABNORMAL HIGH (ref 70–99)
Glucose, Bld: 111 mg/dL — ABNORMAL HIGH (ref 70–99)
Glucose, Bld: 111 mg/dL — ABNORMAL HIGH (ref 70–99)
Glucose, Bld: 121 mg/dL — ABNORMAL HIGH (ref 70–99)
Glucose, Bld: 123 mg/dL — ABNORMAL HIGH (ref 70–99)
Glucose, Bld: 133 mg/dL — ABNORMAL HIGH (ref 70–99)
Glucose, Bld: 134 mg/dL — ABNORMAL HIGH (ref 70–99)
Glucose, Bld: 148 mg/dL — ABNORMAL HIGH (ref 70–99)
Glucose, Bld: 162 mg/dL — ABNORMAL HIGH (ref 70–99)
Glucose, Bld: 197 mg/dL — ABNORMAL HIGH (ref 70–99)
Glucose, Bld: 93 mg/dL (ref 70–99)
Glucose, Bld: 124 mg/dL — ABNORMAL HIGH (ref 70–99)
Glucose, Bld: 138 mg/dL — ABNORMAL HIGH (ref 70–99)
Potassium: 3.2 mEq/L — ABNORMAL LOW (ref 3.5–5.1)
Potassium: 3.2 mEq/L — ABNORMAL LOW (ref 3.5–5.1)
Potassium: 3.3 mEq/L — ABNORMAL LOW (ref 3.5–5.1)
Potassium: 3.5 mEq/L (ref 3.5–5.1)
Potassium: 3.7 mEq/L (ref 3.5–5.1)
Potassium: 3.8 mEq/L (ref 3.5–5.1)
Potassium: 3.8 mEq/L (ref 3.5–5.1)
Potassium: 4.1 mEq/L (ref 3.5–5.1)
Potassium: 4.6 mEq/L (ref 3.5–5.1)
Potassium: 4.8 mEq/L (ref 3.5–5.1)
Potassium: 4.9 mEq/L (ref 3.5–5.1)
Potassium: 3 mEq/L — ABNORMAL LOW (ref 3.5–5.1)
Potassium: 4.2 mEq/L (ref 3.5–5.1)
Sodium: 135 mEq/L (ref 135–145)
Sodium: 136 mEq/L (ref 135–145)
Sodium: 138 mEq/L (ref 135–145)
Sodium: 139 mEq/L (ref 135–145)
Sodium: 139 mEq/L (ref 135–145)
Sodium: 142 mEq/L (ref 135–145)
Sodium: 143 mEq/L (ref 135–145)
Sodium: 144 mEq/L (ref 135–145)
Sodium: 144 mEq/L (ref 135–145)
Sodium: 146 mEq/L — ABNORMAL HIGH (ref 135–145)
Sodium: 148 mEq/L — ABNORMAL HIGH (ref 135–145)
Sodium: 131 mEq/L — ABNORMAL LOW (ref 135–145)
Sodium: 139 mEq/L (ref 135–145)
Total Bilirubin: 0.7 mg/dL (ref 0.3–1.2)
Total Bilirubin: 0.7 mg/dL (ref 0.3–1.2)
Total Bilirubin: 0.8 mg/dL (ref 0.3–1.2)
Total Bilirubin: 0.8 mg/dL (ref 0.3–1.2)
Total Bilirubin: 0.8 mg/dL (ref 0.3–1.2)
Total Bilirubin: 0.9 mg/dL (ref 0.3–1.2)
Total Bilirubin: 1 mg/dL (ref 0.3–1.2)
Total Bilirubin: 1 mg/dL (ref 0.3–1.2)
Total Bilirubin: 1.1 mg/dL (ref 0.3–1.2)
Total Bilirubin: 1.2 mg/dL (ref 0.3–1.2)
Total Bilirubin: 1.8 mg/dL — ABNORMAL HIGH (ref 0.3–1.2)
Total Bilirubin: 0.8 mg/dL (ref 0.3–1.2)
Total Bilirubin: 1.1 mg/dL (ref 0.3–1.2)
Total Protein: 5.4 g/dL — ABNORMAL LOW (ref 6.0–8.3)
Total Protein: 5.4 g/dL — ABNORMAL LOW (ref 6.0–8.3)
Total Protein: 5.6 g/dL — ABNORMAL LOW (ref 6.0–8.3)
Total Protein: 6.2 g/dL (ref 6.0–8.3)
Total Protein: 6.8 g/dL (ref 6.0–8.3)
Total Protein: 6.8 g/dL (ref 6.0–8.3)
Total Protein: 7.1 g/dL (ref 6.0–8.3)
Total Protein: 7.1 g/dL (ref 6.0–8.3)
Total Protein: 7.1 g/dL (ref 6.0–8.3)
Total Protein: 7.3 g/dL (ref 6.0–8.3)
Total Protein: 7.3 g/dL (ref 6.0–8.3)
Total Protein: 5.5 g/dL — ABNORMAL LOW (ref 6.0–8.3)
Total Protein: 6.8 g/dL (ref 6.0–8.3)

## 2010-07-15 LAB — CULTURE, RESPIRATORY W GRAM STAIN: Culture: NORMAL

## 2010-07-15 LAB — POCT I-STAT 3, ART BLOOD GAS (G3+)
Acid-Base Excess: 10 mmol/L — ABNORMAL HIGH (ref 0.0–2.0)
Acid-Base Excess: 13 mmol/L — ABNORMAL HIGH (ref 0.0–2.0)
Acid-Base Excess: 2 mmol/L (ref 0.0–2.0)
Acid-Base Excess: 3 mmol/L — ABNORMAL HIGH (ref 0.0–2.0)
Acid-Base Excess: 3 mmol/L — ABNORMAL HIGH (ref 0.0–2.0)
Acid-Base Excess: 3 mmol/L — ABNORMAL HIGH (ref 0.0–2.0)
Acid-Base Excess: 3 mmol/L — ABNORMAL HIGH (ref 0.0–2.0)
Acid-Base Excess: 4 mmol/L — ABNORMAL HIGH (ref 0.0–2.0)
Acid-Base Excess: 4 mmol/L — ABNORMAL HIGH (ref 0.0–2.0)
Acid-Base Excess: 6 mmol/L — ABNORMAL HIGH (ref 0.0–2.0)
Acid-Base Excess: 6 mmol/L — ABNORMAL HIGH (ref 0.0–2.0)
Acid-Base Excess: 7 mmol/L — ABNORMAL HIGH (ref 0.0–2.0)
Acid-Base Excess: 7 mmol/L — ABNORMAL HIGH (ref 0.0–2.0)
Acid-Base Excess: 8 mmol/L — ABNORMAL HIGH (ref 0.0–2.0)
Acid-Base Excess: 8 mmol/L — ABNORMAL HIGH (ref 0.0–2.0)
Acid-Base Excess: 8 mmol/L — ABNORMAL HIGH (ref 0.0–2.0)
Acid-base deficit: 1 mmol/L (ref 0.0–2.0)
Acid-base deficit: 3 mmol/L — ABNORMAL HIGH (ref 0.0–2.0)
Acid-base deficit: 3 mmol/L — ABNORMAL HIGH (ref 0.0–2.0)
Acid-base deficit: 3 mmol/L — ABNORMAL HIGH (ref 0.0–2.0)
Acid-base deficit: 4 mmol/L — ABNORMAL HIGH (ref 0.0–2.0)
Acid-base deficit: 4 mmol/L — ABNORMAL HIGH (ref 0.0–2.0)
Acid-base deficit: 5 mmol/L — ABNORMAL HIGH (ref 0.0–2.0)
Acid-base deficit: 6 mmol/L — ABNORMAL HIGH (ref 0.0–2.0)
Acid-base deficit: 7 mmol/L — ABNORMAL HIGH (ref 0.0–2.0)
Acid-base deficit: 8 mmol/L — ABNORMAL HIGH (ref 0.0–2.0)
Bicarbonate: 20.9 mEq/L (ref 20.0–24.0)
Bicarbonate: 21.1 mEq/L (ref 20.0–24.0)
Bicarbonate: 23.3 mEq/L (ref 20.0–24.0)
Bicarbonate: 23.3 mEq/L (ref 20.0–24.0)
Bicarbonate: 26.2 mEq/L — ABNORMAL HIGH (ref 20.0–24.0)
Bicarbonate: 26.8 mEq/L — ABNORMAL HIGH (ref 20.0–24.0)
Bicarbonate: 26.8 mEq/L — ABNORMAL HIGH (ref 20.0–24.0)
Bicarbonate: 27.5 mEq/L — ABNORMAL HIGH (ref 20.0–24.0)
Bicarbonate: 27.9 mEq/L — ABNORMAL HIGH (ref 20.0–24.0)
Bicarbonate: 27.9 mEq/L — ABNORMAL HIGH (ref 20.0–24.0)
Bicarbonate: 28.1 mEq/L — ABNORMAL HIGH (ref 20.0–24.0)
Bicarbonate: 30.3 mEq/L — ABNORMAL HIGH (ref 20.0–24.0)
Bicarbonate: 31.3 mEq/L — ABNORMAL HIGH (ref 20.0–24.0)
Bicarbonate: 31.9 mEq/L — ABNORMAL HIGH (ref 20.0–24.0)
Bicarbonate: 32 mEq/L — ABNORMAL HIGH (ref 20.0–24.0)
Bicarbonate: 32.2 mEq/L — ABNORMAL HIGH (ref 20.0–24.0)
Bicarbonate: 33.1 mEq/L — ABNORMAL HIGH (ref 20.0–24.0)
Bicarbonate: 33.1 mEq/L — ABNORMAL HIGH (ref 20.0–24.0)
Bicarbonate: 33.4 mEq/L — ABNORMAL HIGH (ref 20.0–24.0)
Bicarbonate: 37.9 mEq/L — ABNORMAL HIGH (ref 20.0–24.0)
Bicarbonate: 17.4 mEq/L — ABNORMAL LOW (ref 20.0–24.0)
Bicarbonate: 19.3 mEq/L — ABNORMAL LOW (ref 20.0–24.0)
Bicarbonate: 19.3 mEq/L — ABNORMAL LOW (ref 20.0–24.0)
Bicarbonate: 19.6 mEq/L — ABNORMAL LOW (ref 20.0–24.0)
Bicarbonate: 19.7 mEq/L — ABNORMAL LOW (ref 20.0–24.0)
Bicarbonate: 20.7 mEq/L (ref 20.0–24.0)
Bicarbonate: 21.2 mEq/L (ref 20.0–24.0)
O2 Saturation: 67 %
O2 Saturation: 76 %
O2 Saturation: 83 %
O2 Saturation: 83 %
O2 Saturation: 86 %
O2 Saturation: 88 %
O2 Saturation: 90 %
O2 Saturation: 91 %
O2 Saturation: 91 %
O2 Saturation: 91 %
O2 Saturation: 93 %
O2 Saturation: 93 %
O2 Saturation: 94 %
O2 Saturation: 95 %
O2 Saturation: 95 %
O2 Saturation: 95 %
O2 Saturation: 96 %
O2 Saturation: 96 %
O2 Saturation: 97 %
O2 Saturation: 98 %
O2 Saturation: 87 %
O2 Saturation: 91 %
O2 Saturation: 92 %
O2 Saturation: 92 %
O2 Saturation: 94 %
O2 Saturation: 94 %
O2 Saturation: 97 %
Patient temperature: 100.9
Patient temperature: 103
Patient temperature: 103
Patient temperature: 36.6
Patient temperature: 37.6
Patient temperature: 37.7
Patient temperature: 37.9
Patient temperature: 38
Patient temperature: 38.1
Patient temperature: 38.2
Patient temperature: 38.7
Patient temperature: 38.8
Patient temperature: 38.8
Patient temperature: 39.1
Patient temperature: 98.3
Patient temperature: 98.6
Patient temperature: 99
Patient temperature: 99.4
Patient temperature: 99.5
Patient temperature: 100.2
Patient temperature: 100.8
Patient temperature: 37.9
Patient temperature: 38.1
Patient temperature: 38.2
Patient temperature: 99.7
TCO2: 22 mmol/L (ref 0–100)
TCO2: 22 mmol/L (ref 0–100)
TCO2: 24 mmol/L (ref 0–100)
TCO2: 24 mmol/L (ref 0–100)
TCO2: 27 mmol/L (ref 0–100)
TCO2: 28 mmol/L (ref 0–100)
TCO2: 28 mmol/L (ref 0–100)
TCO2: 29 mmol/L (ref 0–100)
TCO2: 29 mmol/L (ref 0–100)
TCO2: 29 mmol/L (ref 0–100)
TCO2: 29 mmol/L (ref 0–100)
TCO2: 32 mmol/L (ref 0–100)
TCO2: 33 mmol/L (ref 0–100)
TCO2: 33 mmol/L (ref 0–100)
TCO2: 33 mmol/L (ref 0–100)
TCO2: 34 mmol/L (ref 0–100)
TCO2: 34 mmol/L (ref 0–100)
TCO2: 35 mmol/L (ref 0–100)
TCO2: 35 mmol/L (ref 0–100)
TCO2: 39 mmol/L (ref 0–100)
TCO2: 18 mmol/L (ref 0–100)
TCO2: 20 mmol/L (ref 0–100)
TCO2: 21 mmol/L (ref 0–100)
TCO2: 21 mmol/L (ref 0–100)
TCO2: 21 mmol/L (ref 0–100)
TCO2: 22 mmol/L (ref 0–100)
TCO2: 22 mmol/L (ref 0–100)
pCO2 arterial: 33.3 mmHg — ABNORMAL LOW (ref 35.0–45.0)
pCO2 arterial: 35.3 mmHg (ref 35.0–45.0)
pCO2 arterial: 35.3 mmHg (ref 35.0–45.0)
pCO2 arterial: 36.1 mmHg (ref 35.0–45.0)
pCO2 arterial: 36.4 mmHg (ref 35.0–45.0)
pCO2 arterial: 38.2 mmHg (ref 35.0–45.0)
pCO2 arterial: 41.6 mmHg (ref 35.0–45.0)
pCO2 arterial: 41.8 mmHg (ref 35.0–45.0)
pCO2 arterial: 41.9 mmHg (ref 35.0–45.0)
pCO2 arterial: 42.3 mmHg (ref 35.0–45.0)
pCO2 arterial: 43.4 mmHg (ref 35.0–45.0)
pCO2 arterial: 43.7 mmHg (ref 35.0–45.0)
pCO2 arterial: 45.6 mmHg — ABNORMAL HIGH (ref 35.0–45.0)
pCO2 arterial: 46.7 mmHg — ABNORMAL HIGH (ref 35.0–45.0)
pCO2 arterial: 47.3 mmHg — ABNORMAL HIGH (ref 35.0–45.0)
pCO2 arterial: 47.8 mmHg — ABNORMAL HIGH (ref 35.0–45.0)
pCO2 arterial: 48.1 mmHg — ABNORMAL HIGH (ref 35.0–45.0)
pCO2 arterial: 48.6 mmHg — ABNORMAL HIGH (ref 35.0–45.0)
pCO2 arterial: 49.4 mmHg — ABNORMAL HIGH (ref 35.0–45.0)
pCO2 arterial: 50.1 mmHg — ABNORMAL HIGH (ref 35.0–45.0)
pCO2 arterial: 28.8 mmHg — ABNORMAL LOW (ref 35.0–45.0)
pCO2 arterial: 35.4 mmHg (ref 35.0–45.0)
pCO2 arterial: 36 mmHg (ref 35.0–45.0)
pCO2 arterial: 36.3 mmHg (ref 35.0–45.0)
pCO2 arterial: 36.8 mmHg (ref 35.0–45.0)
pCO2 arterial: 38.1 mmHg (ref 35.0–45.0)
pCO2 arterial: 42.9 mmHg (ref 35.0–45.0)
pH, Arterial: 7.375 (ref 7.350–7.450)
pH, Arterial: 7.375 (ref 7.350–7.450)
pH, Arterial: 7.387 (ref 7.350–7.450)
pH, Arterial: 7.393 (ref 7.350–7.450)
pH, Arterial: 7.42 (ref 7.350–7.450)
pH, Arterial: 7.421 (ref 7.350–7.450)
pH, Arterial: 7.422 (ref 7.350–7.450)
pH, Arterial: 7.428 (ref 7.350–7.450)
pH, Arterial: 7.438 (ref 7.350–7.450)
pH, Arterial: 7.438 (ref 7.350–7.450)
pH, Arterial: 7.439 (ref 7.350–7.450)
pH, Arterial: 7.442 (ref 7.350–7.450)
pH, Arterial: 7.445 (ref 7.350–7.450)
pH, Arterial: 7.45 (ref 7.350–7.450)
pH, Arterial: 7.455 — ABNORMAL HIGH (ref 7.350–7.450)
pH, Arterial: 7.469 — ABNORMAL HIGH (ref 7.350–7.450)
pH, Arterial: 7.479 — ABNORMAL HIGH (ref 7.350–7.450)
pH, Arterial: 7.49 — ABNORMAL HIGH (ref 7.350–7.450)
pH, Arterial: 7.496 — ABNORMAL HIGH (ref 7.350–7.450)
pH, Arterial: 7.513 — ABNORMAL HIGH (ref 7.350–7.450)
pH, Arterial: 7.264 — ABNORMAL LOW (ref 7.350–7.450)
pH, Arterial: 7.298 — ABNORMAL LOW (ref 7.350–7.450)
pH, Arterial: 7.334 — ABNORMAL LOW (ref 7.350–7.450)
pH, Arterial: 7.355 (ref 7.350–7.450)
pH, Arterial: 7.358 (ref 7.350–7.450)
pH, Arterial: 7.368 (ref 7.350–7.450)
pH, Arterial: 7.444 (ref 7.350–7.450)
pO2, Arterial: 102 mmHg — ABNORMAL HIGH (ref 80.0–100.0)
pO2, Arterial: 106 mmHg — ABNORMAL HIGH (ref 80.0–100.0)
pO2, Arterial: 40 mmHg — ABNORMAL LOW (ref 80.0–100.0)
pO2, Arterial: 41 mmHg — ABNORMAL LOW (ref 80.0–100.0)
pO2, Arterial: 48 mmHg — ABNORMAL LOW (ref 80.0–100.0)
pO2, Arterial: 51 mmHg — ABNORMAL LOW (ref 80.0–100.0)
pO2, Arterial: 53 mmHg — ABNORMAL LOW (ref 80.0–100.0)
pO2, Arterial: 53 mmHg — ABNORMAL LOW (ref 80.0–100.0)
pO2, Arterial: 56 mmHg — ABNORMAL LOW (ref 80.0–100.0)
pO2, Arterial: 62 mmHg — ABNORMAL LOW (ref 80.0–100.0)
pO2, Arterial: 65 mmHg — ABNORMAL LOW (ref 80.0–100.0)
pO2, Arterial: 67 mmHg — ABNORMAL LOW (ref 80.0–100.0)
pO2, Arterial: 69 mmHg — ABNORMAL LOW (ref 80.0–100.0)
pO2, Arterial: 71 mmHg — ABNORMAL LOW (ref 80.0–100.0)
pO2, Arterial: 71 mmHg — ABNORMAL LOW (ref 80.0–100.0)
pO2, Arterial: 74 mmHg — ABNORMAL LOW (ref 80.0–100.0)
pO2, Arterial: 76 mmHg — ABNORMAL LOW (ref 80.0–100.0)
pO2, Arterial: 77 mmHg — ABNORMAL LOW (ref 80.0–100.0)
pO2, Arterial: 78 mmHg — ABNORMAL LOW (ref 80.0–100.0)
pO2, Arterial: 82 mmHg (ref 80.0–100.0)
pO2, Arterial: 56 mmHg — ABNORMAL LOW (ref 80.0–100.0)
pO2, Arterial: 70 mmHg — ABNORMAL LOW (ref 80.0–100.0)
pO2, Arterial: 73 mmHg — ABNORMAL LOW (ref 80.0–100.0)
pO2, Arterial: 77 mmHg — ABNORMAL LOW (ref 80.0–100.0)
pO2, Arterial: 77 mmHg — ABNORMAL LOW (ref 80.0–100.0)
pO2, Arterial: 78 mmHg — ABNORMAL LOW (ref 80.0–100.0)
pO2, Arterial: 86 mmHg (ref 80.0–100.0)

## 2010-07-15 LAB — BASIC METABOLIC PANEL
BUN: 16 mg/dL (ref 6–23)
BUN: 16 mg/dL (ref 6–23)
BUN: 17 mg/dL (ref 6–23)
BUN: 19 mg/dL (ref 6–23)
BUN: 43 mg/dL — ABNORMAL HIGH (ref 6–23)
BUN: 7 mg/dL (ref 6–23)
BUN: 28 mg/dL — ABNORMAL HIGH (ref 6–23)
BUN: 30 mg/dL — ABNORMAL HIGH (ref 6–23)
CO2: 22 mEq/L (ref 19–32)
CO2: 28 mEq/L (ref 19–32)
CO2: 29 mEq/L (ref 19–32)
CO2: 32 mEq/L (ref 19–32)
CO2: 32 mEq/L (ref 19–32)
CO2: 32 mEq/L (ref 19–32)
CO2: 19 mEq/L (ref 19–32)
CO2: 21 mEq/L (ref 19–32)
Calcium: 7.7 mg/dL — ABNORMAL LOW (ref 8.4–10.5)
Calcium: 7.9 mg/dL — ABNORMAL LOW (ref 8.4–10.5)
Calcium: 8.1 mg/dL — ABNORMAL LOW (ref 8.4–10.5)
Calcium: 8.2 mg/dL — ABNORMAL LOW (ref 8.4–10.5)
Calcium: 8.3 mg/dL — ABNORMAL LOW (ref 8.4–10.5)
Calcium: 9.2 mg/dL (ref 8.4–10.5)
Calcium: 7.2 mg/dL — ABNORMAL LOW (ref 8.4–10.5)
Calcium: 7.3 mg/dL — ABNORMAL LOW (ref 8.4–10.5)
Chloride: 100 mEq/L (ref 96–112)
Chloride: 101 mEq/L (ref 96–112)
Chloride: 102 mEq/L (ref 96–112)
Chloride: 109 mEq/L (ref 96–112)
Chloride: 110 mEq/L (ref 96–112)
Chloride: 112 mEq/L (ref 96–112)
Chloride: 105 mEq/L (ref 96–112)
Chloride: 106 mEq/L (ref 96–112)
Creatinine, Ser: 0.51 mg/dL (ref 0.4–1.5)
Creatinine, Ser: 0.79 mg/dL (ref 0.4–1.5)
Creatinine, Ser: 0.81 mg/dL (ref 0.4–1.5)
Creatinine, Ser: 0.87 mg/dL (ref 0.4–1.5)
Creatinine, Ser: 0.94 mg/dL (ref 0.4–1.5)
Creatinine, Ser: 1.1 mg/dL (ref 0.4–1.5)
Creatinine, Ser: 0.99 mg/dL (ref 0.4–1.5)
Creatinine, Ser: 1.24 mg/dL (ref 0.4–1.5)
GFR calc Af Amer: >60 mL/min (ref >60)
GFR calc Af Amer: >60 mL/min (ref >60)
GFR calc Af Amer: >60 mL/min (ref >60)
GFR calc Af Amer: >60 mL/min (ref >60)
GFR calc Af Amer: >60 mL/min (ref >60)
GFR calc Af Amer: >60 mL/min (ref >60)
GFR calc Af Amer: >60 mL/min (ref >60)
GFR calc Af Amer: >60 mL/min (ref >60)
GFR calc non Af Amer: >60 mL/min (ref >60)
GFR calc non Af Amer: >60 mL/min (ref >60)
GFR calc non Af Amer: >60 mL/min (ref >60)
GFR calc non Af Amer: >60 mL/min (ref >60)
GFR calc non Af Amer: >60 mL/min (ref >60)
GFR calc non Af Amer: >60 mL/min (ref >60)
GFR calc non Af Amer: >60 mL/min (ref >60)
GFR calc non Af Amer: >60 mL/min (ref >60)
Glucose, Bld: 101 mg/dL — ABNORMAL HIGH (ref 70–99)
Glucose, Bld: 115 mg/dL — ABNORMAL HIGH (ref 70–99)
Glucose, Bld: 127 mg/dL — ABNORMAL HIGH (ref 70–99)
Glucose, Bld: 135 mg/dL — ABNORMAL HIGH (ref 70–99)
Glucose, Bld: 138 mg/dL — ABNORMAL HIGH (ref 70–99)
Glucose, Bld: 99 mg/dL (ref 70–99)
Glucose, Bld: 235 mg/dL — ABNORMAL HIGH (ref 70–99)
Glucose, Bld: 91 mg/dL (ref 70–99)
Potassium: 3.4 mEq/L — ABNORMAL LOW (ref 3.5–5.1)
Potassium: 4 mEq/L (ref 3.5–5.1)
Potassium: 4.1 mEq/L (ref 3.5–5.1)
Potassium: 4.7 mEq/L (ref 3.5–5.1)
Potassium: 4.7 mEq/L (ref 3.5–5.1)
Potassium: 5.1 mEq/L (ref 3.5–5.1)
Potassium: 3.5 mEq/L (ref 3.5–5.1)
Potassium: 4.5 mEq/L (ref 3.5–5.1)
Sodium: 137 mEq/L (ref 135–145)
Sodium: 138 mEq/L (ref 135–145)
Sodium: 139 mEq/L (ref 135–145)
Sodium: 140 mEq/L (ref 135–145)
Sodium: 143 mEq/L (ref 135–145)
Sodium: 145 mEq/L (ref 135–145)
Sodium: 133 mEq/L — ABNORMAL LOW (ref 135–145)
Sodium: 136 mEq/L (ref 135–145)

## 2010-07-15 LAB — LEGIONELLA ANTIGEN, URINE: Legionella Antigen, Urine: NEGATIVE

## 2010-07-15 LAB — MAGNESIUM
Magnesium: 1.9 mg/dL (ref 1.5–2.5)
Magnesium: 2 mg/dL (ref 1.5–2.5)
Magnesium: 2.1 mg/dL (ref 1.5–2.5)
Magnesium: 2.2 mg/dL (ref 1.5–2.5)
Magnesium: 2.3 mg/dL (ref 1.5–2.5)
Magnesium: 2.4 mg/dL (ref 1.5–2.5)
Magnesium: 2.4 mg/dL (ref 1.5–2.5)
Magnesium: 2.4 mg/dL (ref 1.5–2.5)
Magnesium: 2.4 mg/dL (ref 1.5–2.5)
Magnesium: 2.5 mg/dL (ref 1.5–2.5)
Magnesium: 2.5 mg/dL (ref 1.5–2.5)
Magnesium: 2.6 mg/dL — ABNORMAL HIGH (ref 1.5–2.5)
Magnesium: 2.6 mg/dL — ABNORMAL HIGH (ref 1.5–2.5)
Magnesium: 2.8 mg/dL — ABNORMAL HIGH (ref 1.5–2.5)
Magnesium: 1.5 mg/dL (ref 1.5–2.5)
Magnesium: 1.7 mg/dL (ref 1.5–2.5)
Magnesium: 2.7 mg/dL — ABNORMAL HIGH (ref 1.5–2.5)
Magnesium: 3.3 mg/dL — ABNORMAL HIGH (ref 1.5–2.5)

## 2010-07-15 LAB — CULTURE, BLOOD (ROUTINE X 2)
Culture: NO GROWTH
Culture: NO GROWTH
Culture: NO GROWTH
Culture: NO GROWTH

## 2010-07-15 LAB — PHOSPHORUS
Phosphorus: 2.1 mg/dL — ABNORMAL LOW (ref 2.3–4.6)
Phosphorus: 2.1 mg/dL — ABNORMAL LOW (ref 2.3–4.6)
Phosphorus: 2.2 mg/dL — ABNORMAL LOW (ref 2.3–4.6)
Phosphorus: 2.9 mg/dL (ref 2.3–4.6)
Phosphorus: 2.9 mg/dL (ref 2.3–4.6)
Phosphorus: 3.1 mg/dL (ref 2.3–4.6)
Phosphorus: 3.2 mg/dL (ref 2.3–4.6)
Phosphorus: 3.3 mg/dL (ref 2.3–4.6)
Phosphorus: 3.3 mg/dL (ref 2.3–4.6)
Phosphorus: 3.3 mg/dL (ref 2.3–4.6)
Phosphorus: 3.6 mg/dL (ref 2.3–4.6)
Phosphorus: 3.8 mg/dL (ref 2.3–4.6)
Phosphorus: 3.9 mg/dL (ref 2.3–4.6)
Phosphorus: 4.9 mg/dL — ABNORMAL HIGH (ref 2.3–4.6)
Phosphorus: 2.8 mg/dL (ref 2.3–4.6)
Phosphorus: 2.8 mg/dL (ref 2.3–4.6)
Phosphorus: 3.6 mg/dL (ref 2.3–4.6)

## 2010-07-15 LAB — URINALYSIS, ROUTINE W REFLEX MICROSCOPIC
Bilirubin Urine: NEGATIVE
Glucose, UA: NEGATIVE mg/dL
Glucose, UA: NEGATIVE mg/dL
Hgb urine dipstick: NEGATIVE
Ketones, ur: NEGATIVE mg/dL
Ketones, ur: 15 mg/dL — AB
Leukocytes, UA: NEGATIVE
Leukocytes, UA: NEGATIVE
Nitrite: NEGATIVE
Nitrite: NEGATIVE
Protein, ur: 100 mg/dL — AB
Protein, ur: 100 mg/dL — AB
Specific Gravity, Urine: 1.033 — ABNORMAL HIGH (ref 1.005–1.030)
Specific Gravity, Urine: 1.021 (ref 1.005–1.030)
Urobilinogen, UA: 1 mg/dL (ref 0.0–1.0)
Urobilinogen, UA: 1 mg/dL (ref 0.0–1.0)
pH: 6 (ref 5.0–8.0)
pH: 5.5 (ref 5.0–8.0)

## 2010-07-15 LAB — CBC
HCT: 27.3 % — ABNORMAL LOW (ref 39.0–52.0)
HCT: 28.3 % — ABNORMAL LOW (ref 39.0–52.0)
HCT: 28.6 % — ABNORMAL LOW (ref 39.0–52.0)
HCT: 28.9 % — ABNORMAL LOW (ref 39.0–52.0)
HCT: 28.9 % — ABNORMAL LOW (ref 39.0–52.0)
HCT: 28.9 % — ABNORMAL LOW (ref 39.0–52.0)
HCT: 29 % — ABNORMAL LOW (ref 39.0–52.0)
HCT: 29.1 % — ABNORMAL LOW (ref 39.0–52.0)
HCT: 29.5 % — ABNORMAL LOW (ref 39.0–52.0)
HCT: 29.6 % — ABNORMAL LOW (ref 39.0–52.0)
HCT: 30.6 % — ABNORMAL LOW (ref 39.0–52.0)
HCT: 31.3 % — ABNORMAL LOW (ref 39.0–52.0)
HCT: 31.4 % — ABNORMAL LOW (ref 39.0–52.0)
HCT: 34.5 % — ABNORMAL LOW (ref 39.0–52.0)
HCT: 43.2 % (ref 39.0–52.0)
HCT: 36.1 % — ABNORMAL LOW (ref 39.0–52.0)
HCT: 36.8 % — ABNORMAL LOW (ref 39.0–52.0)
HCT: 38.5 % — ABNORMAL LOW (ref 39.0–52.0)
HCT: 39.3 % (ref 39.0–52.0)
HCT: 43.8 % (ref 39.0–52.0)
Hemoglobin: 10.1 g/dL — ABNORMAL LOW (ref 13.0–17.0)
Hemoglobin: 10.1 g/dL — ABNORMAL LOW (ref 13.0–17.0)
Hemoglobin: 10.1 g/dL — ABNORMAL LOW (ref 13.0–17.0)
Hemoglobin: 10.3 g/dL — ABNORMAL LOW (ref 13.0–17.0)
Hemoglobin: 10.4 g/dL — ABNORMAL LOW (ref 13.0–17.0)
Hemoglobin: 10.8 g/dL — ABNORMAL LOW (ref 13.0–17.0)
Hemoglobin: 11 g/dL — ABNORMAL LOW (ref 13.0–17.0)
Hemoglobin: 11 g/dL — ABNORMAL LOW (ref 13.0–17.0)
Hemoglobin: 11.9 g/dL — ABNORMAL LOW (ref 13.0–17.0)
Hemoglobin: 15.2 g/dL (ref 13.0–17.0)
Hemoglobin: 9.4 g/dL — ABNORMAL LOW (ref 13.0–17.0)
Hemoglobin: 9.8 g/dL — ABNORMAL LOW (ref 13.0–17.0)
Hemoglobin: 9.9 g/dL — ABNORMAL LOW (ref 13.0–17.0)
Hemoglobin: 9.9 g/dL — ABNORMAL LOW (ref 13.0–17.0)
Hemoglobin: 9.9 g/dL — ABNORMAL LOW (ref 13.0–17.0)
Hemoglobin: 12.5 g/dL — ABNORMAL LOW (ref 13.0–17.0)
Hemoglobin: 12.9 g/dL — ABNORMAL LOW (ref 13.0–17.0)
Hemoglobin: 13.6 g/dL (ref 13.0–17.0)
Hemoglobin: 13.9 g/dL (ref 13.0–17.0)
Hemoglobin: 15.5 g/dL (ref 13.0–17.0)
MCHC: 33.7 g/dL (ref 30.0–36.0)
MCHC: 34.3 g/dL (ref 30.0–36.0)
MCHC: 34.4 g/dL (ref 30.0–36.0)
MCHC: 34.5 g/dL (ref 30.0–36.0)
MCHC: 34.7 g/dL (ref 30.0–36.0)
MCHC: 34.7 g/dL (ref 30.0–36.0)
MCHC: 34.8 g/dL (ref 30.0–36.0)
MCHC: 34.9 g/dL (ref 30.0–36.0)
MCHC: 34.9 g/dL (ref 30.0–36.0)
MCHC: 34.9 g/dL (ref 30.0–36.0)
MCHC: 35 g/dL (ref 30.0–36.0)
MCHC: 35 g/dL (ref 30.0–36.0)
MCHC: 35.1 g/dL (ref 30.0–36.0)
MCHC: 35.2 g/dL (ref 30.0–36.0)
MCHC: 35.2 g/dL (ref 30.0–36.0)
MCHC: 34.7 g/dL (ref 30.0–36.0)
MCHC: 35.1 g/dL (ref 30.0–36.0)
MCHC: 35.2 g/dL (ref 30.0–36.0)
MCHC: 35.3 g/dL (ref 30.0–36.0)
MCHC: 35.5 g/dL (ref 30.0–36.0)
MCV: 105.3 fL — ABNORMAL HIGH (ref 78.0–100.0)
MCV: 105.5 fL — ABNORMAL HIGH (ref 78.0–100.0)
MCV: 105.7 fL — ABNORMAL HIGH (ref 78.0–100.0)
MCV: 106.2 fL — ABNORMAL HIGH (ref 78.0–100.0)
MCV: 106.2 fL — ABNORMAL HIGH (ref 78.0–100.0)
MCV: 106.3 fL — ABNORMAL HIGH (ref 78.0–100.0)
MCV: 106.4 fL — ABNORMAL HIGH (ref 78.0–100.0)
MCV: 106.4 fL — ABNORMAL HIGH (ref 78.0–100.0)
MCV: 106.7 fL — ABNORMAL HIGH (ref 78.0–100.0)
MCV: 106.7 fL — ABNORMAL HIGH (ref 78.0–100.0)
MCV: 106.8 fL — ABNORMAL HIGH (ref 78.0–100.0)
MCV: 107.1 fL — ABNORMAL HIGH (ref 78.0–100.0)
MCV: 107.2 fL — ABNORMAL HIGH (ref 78.0–100.0)
MCV: 107.3 fL — ABNORMAL HIGH (ref 78.0–100.0)
MCV: 107.9 fL — ABNORMAL HIGH (ref 78.0–100.0)
MCV: 105.9 fL — ABNORMAL HIGH (ref 78.0–100.0)
MCV: 106.3 fL — ABNORMAL HIGH (ref 78.0–100.0)
MCV: 106.5 fL — ABNORMAL HIGH (ref 78.0–100.0)
MCV: 107 fL — ABNORMAL HIGH (ref 78.0–100.0)
MCV: 107.3 fL — ABNORMAL HIGH (ref 78.0–100.0)
Platelets: 131 10*3/uL — ABNORMAL LOW (ref 150–400)
Platelets: 131 10*3/uL — ABNORMAL LOW (ref 150–400)
Platelets: 187 10*3/uL (ref 150–400)
Platelets: 245 10*3/uL (ref 150–400)
Platelets: 281 10*3/uL (ref 150–400)
Platelets: 346 10*3/uL (ref 150–400)
Platelets: 388 10*3/uL (ref 150–400)
Platelets: 404 10*3/uL — ABNORMAL HIGH (ref 150–400)
Platelets: 431 10*3/uL — ABNORMAL HIGH (ref 150–400)
Platelets: 445 10*3/uL — ABNORMAL HIGH (ref 150–400)
Platelets: 446 10*3/uL — ABNORMAL HIGH (ref 150–400)
Platelets: 460 10*3/uL — ABNORMAL HIGH (ref 150–400)
Platelets: 462 10*3/uL — ABNORMAL HIGH (ref 150–400)
Platelets: 464 10*3/uL — ABNORMAL HIGH (ref 150–400)
Platelets: 480 10*3/uL — ABNORMAL HIGH (ref 150–400)
Platelets: 107 10*3/uL — ABNORMAL LOW (ref 150–400)
Platelets: 132 10*3/uL — ABNORMAL LOW (ref 150–400)
Platelets: 135 10*3/uL — ABNORMAL LOW (ref 150–400)
Platelets: 98 10*3/uL — ABNORMAL LOW (ref 150–400)
RBC: 2.55 MIL/uL — ABNORMAL LOW (ref 4.22–5.81)
RBC: 2.65 MIL/uL — ABNORMAL LOW (ref 4.22–5.81)
RBC: 2.68 MIL/uL — ABNORMAL LOW (ref 4.22–5.81)
RBC: 2.68 MIL/uL — ABNORMAL LOW (ref 4.22–5.81)
RBC: 2.71 MIL/uL — ABNORMAL LOW (ref 4.22–5.81)
RBC: 2.72 MIL/uL — ABNORMAL LOW (ref 4.22–5.81)
RBC: 2.72 MIL/uL — ABNORMAL LOW (ref 4.22–5.81)
RBC: 2.74 MIL/uL — ABNORMAL LOW (ref 4.22–5.81)
RBC: 2.76 MIL/uL — ABNORMAL LOW (ref 4.22–5.81)
RBC: 2.81 MIL/uL — ABNORMAL LOW (ref 4.22–5.81)
RBC: 2.89 MIL/uL — ABNORMAL LOW (ref 4.22–5.81)
RBC: 2.95 MIL/uL — ABNORMAL LOW (ref 4.22–5.81)
RBC: 2.98 MIL/uL — ABNORMAL LOW (ref 4.22–5.81)
RBC: 3.22 MIL/uL — ABNORMAL LOW (ref 4.22–5.81)
RBC: 4.07 MIL/uL — ABNORMAL LOW (ref 4.22–5.81)
RBC: 3.36 MIL/uL — ABNORMAL LOW (ref 4.22–5.81)
RBC: 3.44 MIL/uL — ABNORMAL LOW (ref 4.22–5.81)
RBC: 3.64 MIL/uL — ABNORMAL LOW (ref 4.22–5.81)
RBC: 3.69 MIL/uL — ABNORMAL LOW (ref 4.22–5.81)
RBC: 4.12 MIL/uL — ABNORMAL LOW (ref 4.22–5.81)
RDW: 12.7 % (ref 11.5–15.5)
RDW: 12.8 % (ref 11.5–15.5)
RDW: 12.9 % (ref 11.5–15.5)
RDW: 12.9 % (ref 11.5–15.5)
RDW: 13.1 % (ref 11.5–15.5)
RDW: 13.1 % (ref 11.5–15.5)
RDW: 13.2 % (ref 11.5–15.5)
RDW: 13.3 % (ref 11.5–15.5)
RDW: 13.4 % (ref 11.5–15.5)
RDW: 13.4 % (ref 11.5–15.5)
RDW: 13.5 % (ref 11.5–15.5)
RDW: 13.6 % (ref 11.5–15.5)
RDW: 13.8 % (ref 11.5–15.5)
RDW: 13.9 % (ref 11.5–15.5)
RDW: 14.1 % (ref 11.5–15.5)
RDW: 12.8 % (ref 11.5–15.5)
RDW: 13.2 % (ref 11.5–15.5)
RDW: 13.3 % (ref 11.5–15.5)
RDW: 13.7 % (ref 11.5–15.5)
RDW: 13.9 % (ref 11.5–15.5)
WBC: 10 10*3/uL (ref 4.0–10.5)
WBC: 11.4 10*3/uL — ABNORMAL HIGH (ref 4.0–10.5)
WBC: 11.9 10*3/uL — ABNORMAL HIGH (ref 4.0–10.5)
WBC: 13 10*3/uL — ABNORMAL HIGH (ref 4.0–10.5)
WBC: 14.5 10*3/uL — ABNORMAL HIGH (ref 4.0–10.5)
WBC: 15.1 10*3/uL — ABNORMAL HIGH (ref 4.0–10.5)
WBC: 15.1 10*3/uL — ABNORMAL HIGH (ref 4.0–10.5)
WBC: 18.8 10*3/uL — ABNORMAL HIGH (ref 4.0–10.5)
WBC: 23.1 10*3/uL — ABNORMAL HIGH (ref 4.0–10.5)
WBC: 6.6 10*3/uL (ref 4.0–10.5)
WBC: 7.2 10*3/uL (ref 4.0–10.5)
WBC: 8.1 10*3/uL (ref 4.0–10.5)
WBC: 8.5 10*3/uL (ref 4.0–10.5)
WBC: 9.3 10*3/uL (ref 4.0–10.5)
WBC: 9.9 10*3/uL (ref 4.0–10.5)
WBC: 13.4 10*3/uL — ABNORMAL HIGH (ref 4.0–10.5)
WBC: 16.7 10*3/uL — ABNORMAL HIGH (ref 4.0–10.5)
WBC: 20.4 10*3/uL — ABNORMAL HIGH (ref 4.0–10.5)
WBC: 6.3 10*3/uL (ref 4.0–10.5)
WBC: 9.6 10*3/uL (ref 4.0–10.5)

## 2010-07-15 LAB — PHENYTOIN LEVEL, TOTAL: Phenytoin Lvl: 3.2 ug/mL — ABNORMAL LOW (ref 10.0–20.0)

## 2010-07-15 LAB — PROTIME-INR
INR: 1.1 (ref 0.00–1.49)
INR: 1.1 (ref 0.00–1.49)
INR: 1.2 (ref 0.00–1.49)
INR: 1.1 (ref 0.00–1.49)
INR: 1.2 (ref 0.00–1.49)
Prothrombin Time: 14.2 seconds (ref 11.6–15.2)
Prothrombin Time: 14.8 seconds (ref 11.6–15.2)
Prothrombin Time: 16 seconds — ABNORMAL HIGH (ref 11.6–15.2)
Prothrombin Time: 14.1 seconds (ref 11.6–15.2)
Prothrombin Time: 15.1 seconds (ref 11.6–15.2)

## 2010-07-15 LAB — H1N1 SCREEN (PCR): H1N1 Virus Scrn: NOT DETECTED

## 2010-07-15 LAB — HEPATIC FUNCTION PANEL
ALT: 66 U/L — ABNORMAL HIGH (ref 0–53)
ALT: 22 U/L (ref 0–53)
AST: 115 U/L — ABNORMAL HIGH (ref 0–37)
AST: 39 U/L — ABNORMAL HIGH (ref 0–37)
Albumin: 1.5 g/dL — ABNORMAL LOW (ref 3.5–5.2)
Albumin: 1.9 g/dL — ABNORMAL LOW (ref 3.5–5.2)
Alkaline Phosphatase: 79 U/L (ref 39–117)
Alkaline Phosphatase: 47 U/L (ref 39–117)
Bilirubin, Direct: 0.2 mg/dL (ref 0.0–0.3)
Bilirubin, Direct: 0.4 mg/dL — ABNORMAL HIGH (ref 0.0–0.3)
Indirect Bilirubin: 0.6 mg/dL (ref 0.3–0.9)
Indirect Bilirubin: 0.1 mg/dL — ABNORMAL LOW (ref 0.3–0.9)
Total Bilirubin: 0.8 mg/dL (ref 0.3–1.2)
Total Bilirubin: 0.5 mg/dL (ref 0.3–1.2)
Total Protein: 5.2 g/dL — ABNORMAL LOW (ref 6.0–8.3)
Total Protein: 5.6 g/dL — ABNORMAL LOW (ref 6.0–8.3)

## 2010-07-15 LAB — TSH: TSH: 0.153 u[IU]/mL — ABNORMAL LOW (ref 0.350–4.500)

## 2010-07-15 LAB — CK TOTAL AND CKMB (NOT AT ARMC)
CK, MB: 1.7 ng/mL (ref 0.3–4.0)
Relative Index: INVALID (ref 0.0–2.5)
Total CK: 36 U/L (ref 7–232)

## 2010-07-15 LAB — APTT
aPTT: 29 seconds (ref 24–37)
aPTT: 36 seconds (ref 24–37)

## 2010-07-15 LAB — HEPATITIS PANEL, ACUTE
HCV Ab: REACTIVE — AB
Hep A IgM: NEGATIVE
Hep B C IgM: NEGATIVE
Hepatitis B Surface Ag: NEGATIVE

## 2010-07-15 LAB — HIV ANTIBODY (ROUTINE TESTING W REFLEX): HIV: NONREACTIVE

## 2010-07-15 LAB — URINE CULTURE
Colony Count: NO GROWTH
Colony Count: NO GROWTH
Culture: NO GROWTH
Culture: NO GROWTH

## 2010-07-15 LAB — TYPE AND SCREEN
ABO/RH(D): O POS
Antibody Screen: NEGATIVE

## 2010-07-15 LAB — AMYLASE: Amylase: 49 U/L (ref 27–131)

## 2010-07-15 LAB — CARBOXYHEMOGLOBIN
Carboxyhemoglobin: 0.5 % (ref 0.5–1.5)
Carboxyhemoglobin: 0.7 % (ref 0.5–1.5)
Methemoglobin: 0.5 % (ref 0.0–1.5)
Methemoglobin: 1 % (ref 0.0–1.5)
O2 Saturation: 69.8 %
O2 Saturation: 71 %
Total hemoglobin: 13.4 g/dL — ABNORMAL LOW (ref 13.5–18.0)
Total hemoglobin: 14.5 g/dL (ref 13.5–18.0)

## 2010-07-15 LAB — URINE MICROSCOPIC-ADD ON

## 2010-07-15 LAB — CULTURE, RESPIRATORY: Culture: NO GROWTH

## 2010-07-15 LAB — POCT I-STAT, CHEM 8
BUN: 34 mg/dL — ABNORMAL HIGH (ref 6–23)
Calcium, Ion: 0.96 mmol/L — ABNORMAL LOW (ref 1.12–1.32)
Chloride: 99 mEq/L (ref 96–112)
Creatinine, Ser: 1.3 mg/dL (ref 0.4–1.5)
Glucose, Bld: 121 mg/dL — ABNORMAL HIGH (ref 70–99)
HCT: 47 % (ref 39.0–52.0)
Hemoglobin: 16 g/dL (ref 13.0–17.0)
Potassium: 3.1 mEq/L — ABNORMAL LOW (ref 3.5–5.1)
Sodium: 131 mEq/L — ABNORMAL LOW (ref 135–145)
TCO2: 23 mmol/L (ref 0–100)

## 2010-07-15 LAB — TRIGLYCERIDES
Triglycerides: 170 mg/dL — ABNORMAL HIGH (ref <150)
Triglycerides: 189 mg/dL — ABNORMAL HIGH (ref <150)
Triglycerides: 258 mg/dL — ABNORMAL HIGH (ref <150)
Triglycerides: 295 mg/dL — ABNORMAL HIGH (ref <150)
Triglycerides: 151 mg/dL — ABNORMAL HIGH (ref <150)

## 2010-07-15 LAB — VITAMIN B12
Vitamin B-12: >2000 pg/mL — ABNORMAL HIGH (ref 211–911)
Vitamin B-12: 786 pg/mL (ref 211–911)

## 2010-07-15 LAB — AMMONIA: Ammonia: 12 umol/L (ref 11–35)

## 2010-07-15 LAB — T4, FREE: Free T4: 0.83 ng/dL — ABNORMAL LOW (ref 0.80–1.80)

## 2010-07-15 LAB — VALPROIC ACID LEVEL: Valproic Acid Lvl: 25.2 ug/mL — ABNORMAL LOW (ref 50.0–100.0)

## 2010-07-15 LAB — RAPID URINE DRUG SCREEN, HOSP PERFORMED
Amphetamines: NOT DETECTED
Barbiturates: NOT DETECTED
Benzodiazepines: POSITIVE — AB
Cocaine: NOT DETECTED
Opiates: NOT DETECTED
Tetrahydrocannabinol: NOT DETECTED

## 2010-07-15 LAB — BRAIN NATRIURETIC PEPTIDE: Pro B Natriuretic peptide (BNP): 217 pg/mL — ABNORMAL HIGH (ref 0.0–100.0)

## 2010-07-15 LAB — CULTURE, BAL-QUANTITATIVE W GRAM STAIN: Colony Count: >=100000

## 2010-07-15 LAB — FOLATE RBC: RBC Folate: 1193 ng/mL — ABNORMAL HIGH (ref 180–600)

## 2010-07-15 LAB — ETHANOL: Alcohol, Ethyl (B): <5 mg/dL (ref 0–10)

## 2010-07-15 LAB — D-DIMER, QUANTITATIVE (NOT AT ARMC): D-Dimer, Quant: 2.57 ug/mL-FEU — ABNORMAL HIGH (ref 0.00–0.48)

## 2010-07-15 LAB — CALCIUM, IONIZED
Calcium, Ion: 1.17 mmol/L (ref 1.12–1.32)
Calcium, Ion: 1.1 mmol/L — ABNORMAL LOW (ref 1.12–1.32)

## 2010-07-15 LAB — LACTIC ACID, PLASMA: Lactic Acid, Venous: 2.7 mmol/L — ABNORMAL HIGH (ref 0.5–2.2)

## 2010-07-15 LAB — TROPONIN I: Troponin I: 0.01 ng/mL (ref 0.00–0.06)

## 2010-07-15 LAB — STREP PNEUMONIAE URINARY ANTIGEN: Strep Pneumo Urinary Antigen: POSITIVE — AB

## 2010-07-15 LAB — VANCOMYCIN, TROUGH
Vancomycin Tr: 18.8 ug/mL (ref 10.0–20.0)
Vancomycin Tr: 10.5 ug/mL (ref 10.0–20.0)

## 2010-07-15 LAB — FOLATE: Folate: 14.2 ng/mL

## 2010-07-15 LAB — CORTISOL
Cortisol, Plasma: 4.2 ug/dL
Cortisol, Plasma: 33.7 ug/dL
Cortisol, Plasma: 60.5 ug/dL

## 2010-07-15 LAB — LIPASE, BLOOD: Lipase: <10 U/L — ABNORMAL LOW (ref 11–59)

## 2010-07-15 LAB — ABO/RH: ABO/RH(D): O POS

## 2010-07-15 LAB — HEPATITIS C VRS RNA DETECT BY PCR-QUAL: Hepatitis C Vrs RNA by PCR-Qual: NEGATIVE

## 2010-07-15 LAB — SEDIMENTATION RATE: Sed Rate: 138 mm/hr — ABNORMAL HIGH (ref 0–16)

## 2010-07-15 LAB — CHOLESTEROL, TOTAL: Cholesterol: 92 mg/dL (ref 0–200)

## 2010-08-18 NOTE — Discharge Summary (Signed)
NAMEONOFRIO, KLEMP                 ACCOUNT NO.:  [id number]   MEDICAL RECORD NO.:  [id number]          PATIENT TYPE:  INP   LOCATION:  5531                         FACILITY:  [facility]   PHYSICIAN:  Oley Balm. Sung Amabile, MD   DATE OF BIRTH:  June 19, 1955   DATE OF ADMISSION:  07/16/2008  DATE OF DISCHARGE:  08/09/2008                               DISCHARGE SUMMARY   PATIENT IDENTIFICATION:  Fernando [name], date of birth is June 29, 1955.   DISCHARGE DIAGNOSES:  1. Acute respiratory failure.  2. Severe community acquired pneumonia with acute respiratory distress      syndrome.  3. Mild hepatic dysfunction.  4. Diarrhea.  5. Delirium.  6. Dysphagia.   LINES/TUBES:  1. A T-tube place from July 16, 2005 to July 30, 2008.  2. Right radial arterial line placed from July 16, 2008 to July 26, 2008, with another placed in the same arm on July 27, 2008 and out      on July 31, 2008.  3. A left internal jugular triple lumen catheter placed on July 16, 2008 and removed July 23, 2008.  4. A right PICC line placed on July 31, 2008 and removed on Aug 07, 2008.   CULTURES:  1. Urine strep positive on July 16, 2008.  2. BAL on July 16, 2008, revealed Gram positive cocci in pairs, but      no growth seen on the final culture.  3. Blood cultures x2 on July 16, 2008, with no growth.  4. Urine culture on July 16, 2008, with no growth.  5. An H1N1 nasal swab on July 16, 2008, that was negative.  6. BAL on July 21, 2008, which revealed normal oropharyngeal flora.  7. Blood cultures x2 on July 21, 2008, which revealed no growth.  8. Urine cultures on July 21, 2008, which revealed no growth.   ANTIBIOTICS:  1. The patient was placed on Xigris from July 17, 2008 to July 21, 2008.  2. Vancomycin, July 16, 2008 to July 20, 2008.  3. Azithromycin, July 16, 2008 to July 20, 2008.  4. IV Rocephin, July 16, 2008 to July 22, 2008.  5. Tamiflu, July 16, 2008 to  July 21, 2008.  6. Vancomycin, July 22, 2008 to July 29, 2008, for a re-spike of      fever with a possible hospital-acquired pneumonia.  7. Zosyn on July 22, 2008, for a re-spike in fever and possible      hospital-acquired pneumonia which was discontinued on Aug 04, 2008.  8. Diflucan, July 24, 2008, for which she was placed on secondary to      fever while on antibiotics and was discontinued on July 29, 2008.   PROTOCOL/CONSULTANTS:  1. Sepsis protocol was initiated on July 16, 2008.  2. ARDS protocol.  3. Sedation protocol from July 16, 2008 to July 30, 2008.  4. ICU hyperglycemia protocol.   BEST PRACTICE:  For stress ulcer prevention, the patient  was placed on  Protonix and for DVT prophylaxis the patient was placed on subcu  heparin.   HISTORY OF PRESENT ILLNESS:  Fernando [name] is a 55 year old gentleman who  prior to admission enjoyed good health.  On the morning of July 14, 2008, he awoke with right posterior pleuritic chest pain, diarrhea,  shortness of breath and cough and reported subjective fevers with  reported fevers per wife of 101.4.  He was seen at his primary care  physician's office on the day of admission and was transferred to the  [facility]  emergency department for further evaluation.  In the  emergency room, he was noted to be hypoxemic and started on 100% non-  rebreather mask and continued to complain of pleuritic chest pain, cough  and shortness of breath.  He was also noted to have green sputum.  He  was admitted to the intensive care unit and was intubated late in the  evening on July 16, 2008, per anesthesia and placed on sedation and  ARDS protocol.  Upon admission, he was noted to have bilateral pneumonia  and acute lung injury that quickly decompensated requiring above noted  intubation.   RADIOLOGY:  1. On July 16, 2008, a CT of the head was performed which revealed no      acute findings.  2. On  July 27, 2008, a CT of the sinuses  which revealed bilateral      mastoid and left tympanic cavity inflammatory changes and right      maxillary sinus inflammatory changes in the setting of a      nasogastric tube on the right.  He also underwent CT of the chest      on July 27, 2008, which revealed a severe right lower lobe      pneumonia with superimposed left consolidative infectious changes      throughout the remaining lungs.  He was also noted to have reactive      mediastinal and paratracheal lymphadenopathy.  3.  On August 01, 2008, ultrasound of the abdomen revealed no sonographic      abnormality.   HOSPITAL COURSE BY DISCHARGE DIAGNOSES:  1. Acute respiratory failure.  As per above, the patient was placed in      the intensive care unit and required intubation.  He was on the      ventilator from July 16, 2008 to July 30, 2008, when he was      successfully liberated from mechanical ventilation.  2. Severe community-acquired pneumonia with subsequent ARDS.  Upon      admission, the patient was placed on vancomycin, azithromycin and      Rocephin and the following evening was started on Xigris for his      bilateral pneumonia and associated sepsis.  He required mechanical      ventilation and was extubated on  July 30, 2008, and has since      made progress from the respiratory standpoint to at the time of      discharge not requiring oxygen.  3. Mild hepatic dysfunction.  The patient was noted to be hepatitis C      positive which was a new diagnosis during this admission according      to wife.  Right upper quadrant ultrasound was normal.  He was      treated with PPIs for stress ulcer prophylaxis and trending of his      LFTs were followed.  At the time of discharge on Aug 04, 2008, CMP      revealed AST of 46 and ALT of 162 with noted hepatitis panel with      hepatitis C RNA, PCR negative.  The patient was also noted to have      diarrhea during this admission and Flexi-Seal was placed.  It was       suspected that the diarrhea was secondary to tube feeding, as well      as antibiotic administration.  4. Delirium post extubation that was his major barrier to discharge.      He had agitation and seizures during ICU admission, suspected      secondary to alcohol withdrawal.  He had subsequent mental status      improved up with administration of Klonopin and Seroquel which was      discontinued on Aug 06, 2008, and continued to improve while on      floor.  5. Dysphagia.  Post extubation, the patient was evaluated by speech      therapy.  A MBSS was performed with resultant recommendations that      the patient follow a dysphagia III diet with full supervision with      medications administered with nectar.  Also recommended that the      patient only drink and eat when alert, seated upright at 90 degrees      with the two-to-three swallows after each bite.  6. Deconditioning.  The patient was evaluated post ICU admission by      speech and occupational therapy and worked with daily from a rehab      standpoint.  At the time of discharge, the patient was noted to      have reported progress per physical therapy notes with ambulation,      as well as transfers.  He is noted to be at increased risk for      falls secondary to cognition.   DISCHARGED INSTRUCTIONS:  1. Diet; the patient is to adhere to recommended speech therapy      recommendations with a regular diet.  2. Activity; the patient is allowed activity as tolerated with noted      risk for falls.   DISCHARGE MEDICATIONS:  The patient reports that prior to admission he  was taking Lipitor 20 mg every other day.  As it was not documented on  medication reconciliation, he will be discharged on Lipitor 20 mg daily.   FOLLOWUP:  The patient will need to follow up with Dr. Larina Earthly in 2  weeks for follow-up chest x-ray.  No pulmonary follow-up is needed at  this time unless desired by Dr. Felipa Eth.   DISPOSITION AT TIME OF  DISCHARGE:  The patient has met maximum benefit  of inpatient therapy and is stable at time of discharge.      Canary Brim, NP      Oley Balm. Sung Amabile, MD  Electronically Signed    BO/MEDQ  D:  08/09/2008  T:  08/09/2008  Job:  [id number]   cc:   Larina Earthly, M.D.  Oley Balm Sung Amabile, MD

## 2010-08-18 NOTE — Procedures (Signed)
EEG NUMBER:  [id number]   CLINICAL HISTORY:  A 55 year old man admitted with severe pneumonia on  ventilator, altered mental status, question seizure activity.  EEG is  performed for evaluation.  The patient described as awake and asleep.  This is a portable EEG done at the bedside without photic stimulation or  hyperventilation.   DESCRIPTION:  The dominant rhythm in this tracing is a moderate  amplitude alpha rhythm of 9 Hz, which appears diffusely without abnormal  asymmetry.  It appears briefly and alternates with longer periods of low-  amplitude mixed theta delta rhythms in the 3-7 Hz range.  No focal  slowing is noted and no epileptiform discharges were seen.  No  architecture of sleep is noted.  Photic stimulation and hyperventilation  not performed.  Single channel devoted to EKG revealed sinus tachycardia  throughout, with a rate of approximately 114 beats per minute.   CONCLUSIONS:  Abnormal study due to the presence of diffuse slowing in  the background rhythms, findings suggestive of diffuse widespread  cerebral dysfunction and consistent with encephalopathic and/or demented  state.  No focal slowing is noted and no epileptiform discharges were  seen.      Michael L. Thad Ranger, M.D.  Electronically Signed     [id number]  D:  07/22/2008 18:20:27  T:  07/23/2008 06:40:38  Job #:  [id number]

## 2010-08-18 NOTE — Discharge Summary (Signed)
NAMESHOLOM, DULUDE                 ACCOUNT NO.:  [id number]   MEDICAL RECORD NO.:  [id number]          PATIENT TYPE:  INP   LOCATION:  5531                         FACILITY:  [facility]   PHYSICIAN:  Oley Balm. Sung Amabile, MD   DATE OF BIRTH:  [birth year]   DATE OF ADMISSION:  07/16/2008  DATE OF DISCHARGE:  08/09/2008                               DISCHARGE SUMMARY   ADDENDUM:  The patient will return home with nursing care from [facility] as well as PT and OT at home.  Of note, at the time of  discharge, the patient's cognitive status is much clear.      Canary Brim, NP      Oley Balm. Sung Amabile, MD  Electronically Signed    BO/MEDQ  D:  08/09/2008  T:  08/10/2008  Job:  [id number]

## 2010-08-18 NOTE — H&P (Signed)
Fernando [name], Fernando [name] NO.:  [id number]   MEDICAL RECORD NO.:  [id number]         PATIENT TYPE:  CINP   LOCATION:                               FACILITY:  [facility]   PHYSICIAN:  Oley Balm. Sung Amabile, MD   DATE OF BIRTH:  09-11-1955   DATE OF ADMISSION:  07/16/2008  DATE OF DISCHARGE:                              HISTORY & PHYSICAL   ADMISSION DIAGNOSES:  1. Acute respiratory failure.  2. Severe community-acquired pneumonia.   HISTORY OF PRESENT ILLNESS:  Fernando [name] is a 55 year old gentleman who  generally enjoys good health.  On the morning of July 14, 2008, he  awoke with right posterior pleuritic chest pain, diarrhea, shortness of  breath and cough.  Also, he had subjective fevers.  On the day prior to  this admission, he required a winter note while in the house to feel  comfortable.  His wife tried to take his temperature and obtained a  temperature of 101.4, but the patient had difficulty holding a  thermometer in his mouth and she believes that the temperature was  actually higher than that.  He was seen at his primary care physician's  office on the day of admission and was transferred to the emergency  department at [facility] for further evaluation.  In the  emergency department, he was noted to be hypoxemic and started on 100%  non-rebreather mask.  His cognition is normal.  He continues to have  right-sided pleuritic chest pain, cough, shortness of breath.  The  diarrhea has now resolved.  His sputum is described as green with no  hemoptysis.  He has not received a pneumococcal vaccine in the past.  He  did not receive an influenza vaccination nor did he receive the H1N1  vaccination this year.   PAST MEDICAL HISTORY:  Only notable for hyperlipidemia.   CURRENT MEDICATIONS:  Only takes Lipitor.   SOCIAL HISTORY:  He has no significant occupational exposures.  He  smokes approximately three-quarters pack of cigarettes per day.  No  illicit drug  use.  No excessive alcohol use.   PHYSICAL EXAMINATION:  Afebrile by oral temperature.  Blood pressure  97/65, pulse 140 (sinus rhythm), respirations 35 and moderately labored.  Oxygen saturation 97% on 100% non-rebreather.  HEENT reveals no acute  abnormalities.  Neck is supple without adenopathy or jugular venous  distention.  The chest examination reveals dullness to percussion in the  right mid lung zone.  He has coarse rhonchi bilaterally with no  definitive findings of consolidation.  There is no wheezing noted.  Cardiac exam reveals tachycardia with a normal a S1 and S2.  No murmurs  are heard.  Abdomen is soft, nontender with normal bowel sounds.  Extremities are without clubbing, cyanosis, and edema.  Neurologic exam  is without focal deficits.   DATA:  Cardiac markers are normal.  Chemistries are notable for sodium  131, potassium 3.0, creatinine 1.38.  Liver tests normal.  Lactic acid  level is elevated at 2.7.  CBC notable for normal hemoglobin,  hematocrit.  Platelet count is  low at 98,000.  White blood cell count is  normal at 9600, but there does appear to be a left shift.  Coagulation  studies are normal.  Arterial blood gas reveals a pH of 7.44 with a pCO2  of 29 and pO2 of 86 on 100% non-rebreather.  Chest x-ray reveals dense  right upper lobe consolidation with patchy infiltrate on the left.  EKG  reveals right bundle-branch block which is old, sinus rhythm with rate-  dependent ST depression throughout.   IMPRESSION:  Severe community-acquired pneumonia with acute respiratory  failure and systemic inflammatory response syndrome.  He seems to have  responded to normal saline bolus, but given the elevated lactate, I  think it is best that we place a central venous catheter for early goal-  directed therapy.  We will calculate an APACHE II score to whether  Xigris is indicated.  Broad-spectrum antibiotics will be administered -  vancomycin, ceftriaxone, azithromycin.   In addition, he will be started  empirically on Tamiflu and H1N1 will be ruled out by nasal swab.  For  the time being, I do not feel that he needs to be intubated, but he is  at very high risk of this.  He will be watched in the intensive care  unit for at least the next 24 hours and probably be on that time.  I  have updated him and his wife in great detail regarding his current  diagnosis, our treatment plan and my expectations for response to  therapy.  Overall, I have spent 45 minutes of critical care medicine  time performing this evaluation and dividing this treatment plan.      Oley Balm Sung Amabile, MD  Electronically Signed     DBS/MEDQ  D:  07/16/2008  T:  07/17/2008  Job:  [id number]

## 2010-08-18 NOTE — Discharge Summary (Signed)
NAMEZADEN, SAKO                 ACCOUNT NO.:  [id number]   MEDICAL RECORD NO.:  [id number]          PATIENT TYPE:  INP   LOCATION:  5531                         FACILITY:  [facility]   PHYSICIAN:  Oley Balm. Sung Amabile, MD   DATE OF BIRTH:  09-16-1955   DATE OF ADMISSION:  07/16/2008  DATE OF DISCHARGE:                               DISCHARGE SUMMARY   ADDENDUM:  Please also forward this to Dr. Felipa Eth.  Of note, hepatitis C  RNA was negative on this admission but previous documentation was noted  that the patient had been told he was positive for hepatitis C.  Would  recommend that this is followed up on as an outpatient.      Canary Brim, NP      Oley Balm. Sung Amabile, MD  Electronically Signed    BO/MEDQ  D:  08/09/2008  T:  08/09/2008  Job:  [id number]

## 2010-08-18 NOTE — Discharge Summary (Signed)
NAMEBYRANT, VALENT                 ACCOUNT NO.:  [id number]   MEDICAL RECORD NO.:  [id number]          PATIENT TYPE:  INP   LOCATION:  5531                         FACILITY:  [facility]   PHYSICIAN:  Not Given              DATE OF BIRTH:  31-May-1955   DATE OF ADMISSION:  07/16/2008  DATE OF DISCHARGE:                               DISCHARGE SUMMARY   ADDENDUM:  Please disregard the previous, I think it is the third  addendum, and include this in documentation.  Again, please disregard  the third, the prior addendum and include this in the information.  Regarding hepatitis C, the patient's acute hepatitis panel on July 30, 2008, revealed a hepatitis C antibody that was reactive.  However,  hepatitis C RNA was negative.  Subsequently the patient should be  followed up with GI for serologies for hepatitis C and determine course  of therapy with GI.      Canary Brim, NP    ______________________________  Not Given    BO/MEDQ  D:  08/09/2008  T:  08/09/2008  Job:  [id number]

## 2010-09-10 ENCOUNTER — Other Ambulatory Visit: Payer: Self-pay | Admitting: Dermatology

## 2010-12-17 ENCOUNTER — Encounter: Payer: Self-pay | Admitting: Internal Medicine

## 2011-01-01 ENCOUNTER — Encounter: Payer: Self-pay | Admitting: Internal Medicine

## 2011-02-05 ENCOUNTER — Encounter: Payer: Self-pay | Admitting: Internal Medicine

## 2011-02-05 ENCOUNTER — Ambulatory Visit (AMBULATORY_SURGERY_CENTER): Payer: 59 | Admitting: *Deleted

## 2011-02-05 VITALS — Ht 69.0 in | Wt 162.8 lb

## 2011-02-05 DIAGNOSIS — Z86010 Personal history of colonic polyps: Secondary | ICD-10-CM

## 2011-02-05 MED ORDER — PEG-KCL-NACL-NASULF-NA ASC-C 100 G PO SOLR: ORAL | Status: DC

## 2011-02-19 ENCOUNTER — Encounter: Payer: Self-pay | Admitting: Internal Medicine

## 2011-02-19 ENCOUNTER — Ambulatory Visit (AMBULATORY_SURGERY_CENTER): Payer: 59 | Admitting: Internal Medicine

## 2011-02-19 VITALS — BP 143/90 | HR 92 | Temp 97.7°F | Resp 19 | Ht 69.0 in | Wt 162.0 lb

## 2011-02-19 DIAGNOSIS — D128 Benign neoplasm of rectum: Secondary | ICD-10-CM

## 2011-02-19 DIAGNOSIS — D126 Benign neoplasm of colon, unspecified: Secondary | ICD-10-CM

## 2011-02-19 DIAGNOSIS — Z1211 Encounter for screening for malignant neoplasm of colon: Secondary | ICD-10-CM

## 2011-02-19 DIAGNOSIS — D129 Benign neoplasm of anus and anal canal: Secondary | ICD-10-CM

## 2011-02-19 DIAGNOSIS — Z86010 Personal history of colonic polyps: Secondary | ICD-10-CM

## 2011-02-19 MED ORDER — SODIUM CHLORIDE 0.9 % IV SOLN: 500.0000 mL | INTRAVENOUS | Status: DC

## 2011-02-19 NOTE — Patient Instructions (Signed)
Please refer to the blue and neon green sheets for instructions regarding diet and activity for the rest of today.  Handout on polyps given. Resume previous medications. 

## 2011-02-22 ENCOUNTER — Telehealth: Payer: Self-pay

## 2011-02-22 NOTE — Telephone Encounter (Signed)
No ID on answering machine. 

## 2014-12-30 ENCOUNTER — Encounter: Payer: Self-pay | Admitting: Internal Medicine

## 2016-04-14 DIAGNOSIS — Z125 Encounter for screening for malignant neoplasm of prostate: Secondary | ICD-10-CM | POA: Diagnosis not present

## 2016-04-14 DIAGNOSIS — I10 Essential (primary) hypertension: Secondary | ICD-10-CM | POA: Diagnosis not present

## 2016-04-14 DIAGNOSIS — E038 Other specified hypothyroidism: Secondary | ICD-10-CM | POA: Diagnosis not present

## 2016-04-14 DIAGNOSIS — E784 Other hyperlipidemia: Secondary | ICD-10-CM | POA: Diagnosis not present

## 2016-05-12 DIAGNOSIS — L821 Other seborrheic keratosis: Secondary | ICD-10-CM | POA: Diagnosis not present

## 2016-05-25 DIAGNOSIS — Z0000 Encounter for general adult medical examination without abnormal findings: Secondary | ICD-10-CM | POA: Diagnosis not present

## 2016-05-25 DIAGNOSIS — I4510 Unspecified right bundle-branch block: Secondary | ICD-10-CM | POA: Diagnosis not present

## 2016-05-25 DIAGNOSIS — I10 Essential (primary) hypertension: Secondary | ICD-10-CM | POA: Diagnosis not present

## 2016-05-25 DIAGNOSIS — R7301 Impaired fasting glucose: Secondary | ICD-10-CM | POA: Diagnosis not present

## 2016-05-25 DIAGNOSIS — E784 Other hyperlipidemia: Secondary | ICD-10-CM | POA: Diagnosis not present

## 2016-10-19 DIAGNOSIS — D485 Neoplasm of uncertain behavior of skin: Secondary | ICD-10-CM | POA: Diagnosis not present

## 2016-10-19 DIAGNOSIS — L570 Actinic keratosis: Secondary | ICD-10-CM | POA: Diagnosis not present

## 2016-10-19 DIAGNOSIS — L720 Epidermal cyst: Secondary | ICD-10-CM | POA: Diagnosis not present

## 2016-10-19 DIAGNOSIS — D692 Other nonthrombocytopenic purpura: Secondary | ICD-10-CM | POA: Diagnosis not present

## 2016-10-19 DIAGNOSIS — L814 Other melanin hyperpigmentation: Secondary | ICD-10-CM | POA: Diagnosis not present

## 2016-10-19 DIAGNOSIS — Z85828 Personal history of other malignant neoplasm of skin: Secondary | ICD-10-CM | POA: Diagnosis not present

## 2017-01-18 DIAGNOSIS — M25562 Pain in left knee: Secondary | ICD-10-CM | POA: Diagnosis not present

## 2017-01-18 DIAGNOSIS — M1712 Unilateral primary osteoarthritis, left knee: Secondary | ICD-10-CM | POA: Diagnosis not present

## 2017-02-15 DIAGNOSIS — D3709 Neoplasm of uncertain behavior of other specified sites of the oral cavity: Secondary | ICD-10-CM | POA: Diagnosis not present

## 2017-06-07 DIAGNOSIS — Z0000 Encounter for general adult medical examination without abnormal findings: Secondary | ICD-10-CM | POA: Diagnosis not present

## 2017-06-07 DIAGNOSIS — R82998 Other abnormal findings in urine: Secondary | ICD-10-CM | POA: Diagnosis not present

## 2017-06-07 DIAGNOSIS — I10 Essential (primary) hypertension: Secondary | ICD-10-CM | POA: Diagnosis not present

## 2017-06-14 ENCOUNTER — Encounter: Payer: Self-pay | Admitting: Internal Medicine

## 2017-06-14 DIAGNOSIS — M25559 Pain in unspecified hip: Secondary | ICD-10-CM | POA: Diagnosis not present

## 2017-06-14 DIAGNOSIS — Z1389 Encounter for screening for other disorder: Secondary | ICD-10-CM | POA: Diagnosis not present

## 2017-06-14 DIAGNOSIS — M109 Gout, unspecified: Secondary | ICD-10-CM | POA: Diagnosis not present

## 2017-06-14 DIAGNOSIS — I10 Essential (primary) hypertension: Secondary | ICD-10-CM | POA: Diagnosis not present

## 2017-06-14 DIAGNOSIS — Z0000 Encounter for general adult medical examination without abnormal findings: Secondary | ICD-10-CM | POA: Diagnosis not present

## 2017-06-22 DIAGNOSIS — Z1212 Encounter for screening for malignant neoplasm of rectum: Secondary | ICD-10-CM | POA: Diagnosis not present

## 2017-07-28 ENCOUNTER — Ambulatory Visit (AMBULATORY_SURGERY_CENTER): Payer: Self-pay

## 2017-07-28 ENCOUNTER — Encounter: Payer: Self-pay | Admitting: Internal Medicine

## 2017-07-28 ENCOUNTER — Other Ambulatory Visit: Payer: Self-pay

## 2017-07-28 VITALS — Ht 68.0 in | Wt 168.6 lb

## 2017-07-28 DIAGNOSIS — Z86010 Personal history of colonic polyps: Secondary | ICD-10-CM

## 2017-07-28 MED ORDER — NA SULFATE-K SULFATE-MG SULF 17.5-3.13-1.6 GM/177ML PO SOLN: 1.0000 | Freq: Once | ORAL | 0 refills | Status: AC

## 2017-07-28 NOTE — Progress Notes (Signed)
Denies allergies to eggs or soy products. Denies complication of anesthesia or sedation. Denies use of weight loss medication. Denies use of O2.   Emmi instructions declined.  

## 2017-08-11 ENCOUNTER — Encounter: Payer: Self-pay | Admitting: Internal Medicine

## 2017-08-11 ENCOUNTER — Ambulatory Visit (AMBULATORY_SURGERY_CENTER): Payer: 59 | Admitting: Internal Medicine

## 2017-08-11 ENCOUNTER — Other Ambulatory Visit: Payer: Self-pay

## 2017-08-11 VITALS — BP 135/87 | HR 95 | Temp 99.5°F | Resp 21 | Ht 68.0 in | Wt 168.0 lb

## 2017-08-11 DIAGNOSIS — Z86010 Personal history of colonic polyps: Secondary | ICD-10-CM

## 2017-08-11 DIAGNOSIS — D123 Benign neoplasm of transverse colon: Secondary | ICD-10-CM | POA: Diagnosis not present

## 2017-08-11 DIAGNOSIS — D124 Benign neoplasm of descending colon: Secondary | ICD-10-CM

## 2017-08-11 MED ORDER — SODIUM CHLORIDE 0.9 % IV SOLN: 500.0000 mL | Freq: Once | INTRAVENOUS | Status: DC

## 2017-08-11 NOTE — Progress Notes (Signed)
Report to PACU, RN, vss, BBS= Clear.  

## 2017-08-11 NOTE — Progress Notes (Signed)
Called to room to assist during endoscopic procedure.  Patient ID and intended procedure confirmed with present staff. Received instructions for my participation in the procedure from the performing physician.  

## 2017-08-11 NOTE — Progress Notes (Signed)
Pt's states no medical or surgical changes since previsit or office visit. 

## 2017-08-11 NOTE — Op Note (Signed)
[place] Patient Name: Fernando [name] Procedure Date: 08/11/2017 9:17 AM MRN: [id number] Endoscopist: Docia Chuck. Henrene Pastor , MD Age: 62 Referring MD:  Date of Birth: [birth year] Gender: Male Account #: [id number] Procedure:                Colonoscopy, With cold snare polypectomy x 2 Indications:              High risk colon cancer surveillance: Personal                            history of multiple (3 or more) adenomas. Previous                            examinations 2007 and 2012 Medicines:                Monitored Anesthesia Care Procedure:                Pre-Anesthesia Assessment:                           - Prior to the procedure, a History and Physical                            was performed, and patient medications and                            allergies were reviewed. The patient's tolerance of                            previous anesthesia was also reviewed. The risks                            and benefits of the procedure and the sedation                            options and risks were discussed with the patient.                            All questions were answered, and informed consent                            was obtained. Prior Anticoagulants: The patient has                            taken no previous anticoagulant or antiplatelet                            agents. ASA Grade Assessment: II - A patient with                            mild systemic disease. After reviewing the risks                            and benefits, the patient was deemed in  satisfactory condition to undergo the procedure.                           After obtaining informed consent, the colonoscope                            was passed under direct vision. Throughout the                            procedure, the patient's blood pressure, pulse, and                            oxygen saturations were monitored continuously. The                            Model  CF-HQ190L [id number]) scope was introduced                            through the anus and advanced to the the cecum,                            identified by appendiceal orifice and ileocecal                            valve. The ileocecal valve, appendiceal orifice,                            and rectum were photographed. The quality of the                            bowel preparation was good. The colonoscopy was                            performed without difficulty. The patient tolerated                            the procedure well. The bowel preparation used was                            SUPREP. Scope In: 9:25:47 AM Scope Out: 2:44:01 AM Scope Withdrawal Time: 0 hours 11 minutes 28 seconds  Total Procedure Duration: 0 hours 12 minutes 31 seconds  Findings:                 Two polyps were found in the descending colon and                            transverse colon. The polyps were 3 to 4 mm in                            size. These polyps were removed with a cold snare.                            Resection and retrieval were complete.  The exam was otherwise without abnormality on                            direct and retroflexion views. Complications:            No immediate complications. Estimated blood loss:                            None. Estimated Blood Loss:     Estimated blood loss: none. Impression:               - Two 3 to 4 mm polyps in the descending colon and                            in the transverse colon, removed with a cold snare.                            Resected and retrieved.                           - The examination was otherwise normal on direct                            and retroflexion views. Recommendation:           - Repeat colonoscopy in 5 years for surveillance.                           - Patient has a contact number available for                            emergencies. The signs and symptoms of potential                             delayed complications were discussed with the                            patient. Return to normal activities tomorrow.                            Written discharge instructions were provided to the                            patient.                           - Resume previous diet.                           - Continue present medications.                           - Await pathology results. Docia Chuck. Henrene Pastor, MD 08/11/2017 9:42:19 AM This report has been signed electronically.

## 2017-08-11 NOTE — Patient Instructions (Signed)
YOU HAD AN ENDOSCOPIC PROCEDURE TODAY AT THE [facility]:   Refer to the procedure report that was given to you for any specific questions about what was found during the examination.  If the procedure report does not answer your questions, please call your gastroenterologist to clarify.  If you requested that your care partner not be given the details of your procedure findings, then the procedure report has been included in a sealed envelope for you to review at your convenience later.  YOU SHOULD EXPECT: Some feelings of bloating in the abdomen. Passage of more gas than usual.  Walking can help get rid of the air that was put into your GI tract during the procedure and reduce the bloating. If you had a lower endoscopy (such as a colonoscopy or flexible sigmoidoscopy) you may notice spotting of blood in your stool or on the toilet paper. If you underwent a bowel prep for your procedure, you may not have a normal bowel movement for a few days.  Please Note:  You might notice some irritation and congestion in your nose or some drainage.  This is from the oxygen used during your procedure.  There is no need for concern and it should clear up in a day or so.  SYMPTOMS TO REPORT IMMEDIATELY:   Following lower endoscopy (colonoscopy or flexible sigmoidoscopy):  Excessive amounts of blood in the stool  Significant tenderness or worsening of abdominal pains  Swelling of the abdomen that is new, acute  Fever of 100F or higher  Please see handout on polyps.  For urgent or emergent issues, a gastroenterologist can be reached at any hour by calling [phone].   DIET:  We do recommend a small meal at first, but then you may proceed to your regular diet.  Drink plenty of fluids but you should avoid alcoholic beverages for 24 hours.  ACTIVITY:  You should plan to take it easy for the rest of today and you should NOT DRIVE or use heavy machinery until tomorrow (because of the sedation  medicines used during the test).    FOLLOW UP: Our staff will call the number listed on your records the next business day following your procedure to check on you and address any questions or concerns that you may have regarding the information given to you following your procedure. If we do not reach you, we will leave a message.  However, if you are feeling well and you are not experiencing any problems, there is no need to return our call.  We will assume that you have returned to your regular daily activities without incident.  If any biopsies were taken you will be contacted by phone or by letter within the next 1-3 weeks.  Please call us at [phone] if you have not heard about the biopsies in 3 weeks.    SIGNATURES/CONFIDENTIALITY: You and/or your care partner have signed paperwork which will be entered into your electronic medical record.  These signatures attest to the fact that that the information above on your After Visit Summary has been reviewed and is understood.  Full responsibility of the confidentiality of this discharge information lies with you and/or your care-partner.   Thank you for allowing us to provide your healthcare today.  

## 2017-08-12 ENCOUNTER — Telehealth: Payer: Self-pay

## 2017-08-12 NOTE — Telephone Encounter (Signed)
  Follow up Call-  Call back number 08/11/2017  Post procedure Call Back phone  # [phone]  Permission to leave phone message Yes  Some recent data might be hidden     Patient questions:  Do you have a fever, pain , or abdominal swelling? No. Pain Score  0 *  Have you tolerated food without any problems? Yes.    Have you been able to return to your normal activities? Yes.    Do you have any questions about your discharge instructions: Diet   No. Medications  No. Follow up visit  No.  Do you have questions or concerns about your Care? No.  Actions: * If pain score is 4 or above: No action needed, pain <4.

## 2017-08-16 ENCOUNTER — Encounter: Payer: Self-pay | Admitting: Internal Medicine

## 2017-10-19 DIAGNOSIS — L570 Actinic keratosis: Secondary | ICD-10-CM | POA: Diagnosis not present

## 2017-10-19 DIAGNOSIS — L111 Transient acantholytic dermatosis [Grover]: Secondary | ICD-10-CM | POA: Diagnosis not present

## 2017-10-19 DIAGNOSIS — L280 Lichen simplex chronicus: Secondary | ICD-10-CM | POA: Diagnosis not present

## 2017-10-19 DIAGNOSIS — Z85828 Personal history of other malignant neoplasm of skin: Secondary | ICD-10-CM | POA: Diagnosis not present

## 2018-02-02 DIAGNOSIS — S81809A Unspecified open wound, unspecified lower leg, initial encounter: Secondary | ICD-10-CM | POA: Diagnosis not present

## 2018-02-02 DIAGNOSIS — L03116 Cellulitis of left lower limb: Secondary | ICD-10-CM | POA: Diagnosis not present

## 2018-02-02 DIAGNOSIS — I10 Essential (primary) hypertension: Secondary | ICD-10-CM | POA: Diagnosis not present

## 2018-02-04 ENCOUNTER — Encounter
Admission: EM | Disposition: A | Discharge status: Home / Self Care | Payer: Self-pay | Source: Home / Self Care | Attending: Emergency Medicine

## 2018-02-04 ENCOUNTER — Emergency Department: Payer: 59

## 2018-02-04 ENCOUNTER — Encounter: Payer: Self-pay | Admitting: Emergency Medicine

## 2018-02-04 ENCOUNTER — Other Ambulatory Visit: Payer: Self-pay

## 2018-02-04 ENCOUNTER — Observation Stay
Admission: EM | Admit: 2018-02-04 | Discharge: 2018-02-05 | DRG: 603 | Disposition: A | Discharge status: Home / Self Care | Payer: 59 | Attending: Internal Medicine | Admitting: Internal Medicine

## 2018-02-04 DIAGNOSIS — F1010 Alcohol abuse, uncomplicated: Secondary | ICD-10-CM | POA: Diagnosis present

## 2018-02-04 DIAGNOSIS — M009 Pyogenic arthritis, unspecified: Secondary | ICD-10-CM | POA: Diagnosis not present

## 2018-02-04 DIAGNOSIS — L089 Local infection of the skin and subcutaneous tissue, unspecified: Secondary | ICD-10-CM | POA: Diagnosis not present

## 2018-02-04 DIAGNOSIS — Z79899 Other long term (current) drug therapy: Secondary | ICD-10-CM

## 2018-02-04 DIAGNOSIS — E785 Hyperlipidemia, unspecified: Secondary | ICD-10-CM | POA: Diagnosis not present

## 2018-02-04 DIAGNOSIS — I10 Essential (primary) hypertension: Secondary | ICD-10-CM | POA: Diagnosis not present

## 2018-02-04 DIAGNOSIS — M109 Gout, unspecified: Secondary | ICD-10-CM | POA: Diagnosis not present

## 2018-02-04 DIAGNOSIS — M79605 Pain in left leg: Secondary | ICD-10-CM | POA: Diagnosis not present

## 2018-02-04 DIAGNOSIS — M7989 Other specified soft tissue disorders: Secondary | ICD-10-CM | POA: Diagnosis present

## 2018-02-04 DIAGNOSIS — W19XXXA Unspecified fall, initial encounter: Secondary | ICD-10-CM | POA: Diagnosis not present

## 2018-02-04 DIAGNOSIS — M1712 Unilateral primary osteoarthritis, left knee: Secondary | ICD-10-CM | POA: Diagnosis not present

## 2018-02-04 DIAGNOSIS — L03116 Cellulitis of left lower limb: Secondary | ICD-10-CM | POA: Diagnosis not present

## 2018-02-04 DIAGNOSIS — M25462 Effusion, left knee: Secondary | ICD-10-CM | POA: Diagnosis not present

## 2018-02-04 DIAGNOSIS — T148XXA Other injury of unspecified body region, initial encounter: Secondary | ICD-10-CM

## 2018-02-04 DIAGNOSIS — F17210 Nicotine dependence, cigarettes, uncomplicated: Secondary | ICD-10-CM | POA: Diagnosis present

## 2018-02-04 DIAGNOSIS — M1990 Unspecified osteoarthritis, unspecified site: Secondary | ICD-10-CM | POA: Diagnosis present

## 2018-02-04 DIAGNOSIS — T798XXA Other early complications of trauma, initial encounter: Secondary | ICD-10-CM | POA: Diagnosis not present

## 2018-02-04 LAB — CBC WITH DIFFERENTIAL/PLATELET
Abs Immature Granulocytes: 0.05 10*3/uL (ref 0.00–0.07)
Basophils Absolute: 0 10*3/uL (ref 0.0–0.1)
Basophils Relative: 0 %
Eosinophils Absolute: 0 10*3/uL (ref 0.0–0.5)
Eosinophils Relative: 0 %
HCT: 45.4 % (ref 39.0–52.0)
Hemoglobin: 15.8 g/dL (ref 13.0–17.0)
Immature Granulocytes: 1 %
Lymphocytes Relative: 9 %
Lymphs Abs: 0.9 10*3/uL (ref 0.7–4.0)
MCH: 38.5 pg — ABNORMAL HIGH (ref 26.0–34.0)
MCHC: 34.8 g/dL (ref 30.0–36.0)
MCV: 110.7 fL — ABNORMAL HIGH (ref 80.0–100.0)
Monocytes Absolute: 1.1 10*3/uL — ABNORMAL HIGH (ref 0.1–1.0)
Monocytes Relative: 12 %
Neutro Abs: 7.3 10*3/uL (ref 1.7–7.7)
Neutrophils Relative %: 78 %
Platelets: 154 10*3/uL (ref 150–400)
RBC: 4.1 MIL/uL — ABNORMAL LOW (ref 4.22–5.81)
RDW: 12 % (ref 11.5–15.5)
WBC: 9.4 10*3/uL (ref 4.0–10.5)
nRBC: 0 % (ref 0.0–0.2)

## 2018-02-04 LAB — COMPREHENSIVE METABOLIC PANEL
ALT: 23 U/L (ref 0–44)
AST: 25 U/L (ref 15–41)
Albumin: 4.2 g/dL (ref 3.5–5.0)
Alkaline Phosphatase: 85 U/L (ref 38–126)
Anion gap: 12 (ref 5–15)
BUN: 15 mg/dL (ref 8–23)
CO2: 25 mmol/L (ref 22–32)
Calcium: 9.4 mg/dL (ref 8.9–10.3)
Chloride: 98 mmol/L (ref 98–111)
Creatinine, Ser: 0.82 mg/dL (ref 0.61–1.24)
GFR calc Af Amer: >60 mL/min (ref >60)
GFR calc non Af Amer: >60 mL/min (ref >60)
Glucose, Bld: 107 mg/dL — ABNORMAL HIGH (ref 70–99)
Potassium: 4 mmol/L (ref 3.5–5.1)
Sodium: 135 mmol/L (ref 135–145)
Total Bilirubin: 1.2 mg/dL (ref 0.3–1.2)
Total Protein: 7.8 g/dL (ref 6.5–8.1)

## 2018-02-04 LAB — SYNOVIAL CELL COUNT + DIFF, W/ CRYSTALS
Eosinophils-Synovial: 0 %
Lymphocytes-Synovial Fld: 2 %
Monocyte-Macrophage-Synovial Fluid: 3 %
Neutrophil, Synovial: 95 %
Other Cells-SYN: 0
WBC, Synovial: 44236 /mm3 — ABNORMAL HIGH (ref 0–200)

## 2018-02-04 LAB — URIC ACID: Uric Acid, Serum: 4.8 mg/dL (ref 3.7–8.6)

## 2018-02-04 LAB — FIBRIN DERIVATIVES D-DIMER (ARMC ONLY): Fibrin derivatives D-dimer (ARMC): 657.25 ng/mL (FEU) — ABNORMAL HIGH (ref 0.00–499.00)

## 2018-02-04 LAB — SEDIMENTATION RATE: Sed Rate: 43 mm/hr — ABNORMAL HIGH (ref 0–20)

## 2018-02-04 SURGERY — IRRIGATION AND DEBRIDEMENT KNEE
Anesthesia: Choice

## 2018-02-04 MED ORDER — ACETAMINOPHEN 325 MG PO TABS: 650.0000 mg | ORAL_TABLET | Freq: Four times a day (QID) | ORAL | Status: DC | PRN

## 2018-02-04 MED ORDER — HYDROMORPHONE HCL 1 MG/ML IJ SOLN
1.0000 mg | Freq: Once | INTRAMUSCULAR | Status: AC
  Administered 2018-02-04: 1 mg via INTRAVENOUS
  Filled 2018-02-04: qty 1

## 2018-02-04 MED ORDER — ATORVASTATIN CALCIUM 10 MG PO TABS
10.0000 mg | ORAL_TABLET | Freq: Every day | ORAL | Status: DC
  Administered 2018-02-05: 10 mg via ORAL
  Filled 2018-02-04: qty 1

## 2018-02-04 MED ORDER — SODIUM CHLORIDE 0.9 % IV SOLN
2.0000 g | Freq: Once | INTRAVENOUS | Status: DC
  Filled 2018-02-04: qty 2

## 2018-02-04 MED ORDER — ALBUTEROL SULFATE (2.5 MG/3ML) 0.083% IN NEBU: 2.5000 mg | INHALATION_SOLUTION | RESPIRATORY_TRACT | Status: DC | PRN

## 2018-02-04 MED ORDER — VANCOMYCIN HCL 10 G IV SOLR
1250.0000 mg | Freq: Two times a day (BID) | INTRAVENOUS | Status: DC
  Administered 2018-02-04: 1250 mg via INTRAVENOUS
  Filled 2018-02-04 (×4): qty 1250

## 2018-02-04 MED ORDER — HYDROMORPHONE HCL 1 MG/ML IJ SOLN
0.5000 mg | Freq: Once | INTRAMUSCULAR | Status: AC
  Administered 2018-02-04: 0.5 mg via INTRAVENOUS
  Filled 2018-02-04: qty 1

## 2018-02-04 MED ORDER — VANCOMYCIN HCL 10 G IV SOLR
1250.0000 mg | Freq: Once | INTRAVENOUS | Status: AC
  Administered 2018-02-04: 1250 mg via INTRAVENOUS
  Filled 2018-02-04 (×2): qty 1250

## 2018-02-04 MED ORDER — CLINDAMYCIN PHOSPHATE 600 MG/50ML IV SOLN: 600.0000 mg | Freq: Once | INTRAVENOUS | Status: DC

## 2018-02-04 MED ORDER — IBUPROFEN 400 MG PO TABS
400.0000 mg | ORAL_TABLET | Freq: Four times a day (QID) | ORAL | Status: DC | PRN
  Filled 2018-02-04: qty 1

## 2018-02-04 MED ORDER — MORPHINE SULFATE (PF) 2 MG/ML IV SOLN
2.0000 mg | INTRAVENOUS | Status: DC | PRN
  Administered 2018-02-04 (×2): 2 mg via INTRAVENOUS
  Filled 2018-02-04 (×2): qty 1

## 2018-02-04 MED ORDER — ONDANSETRON HCL 4 MG PO TABS: 4.0000 mg | ORAL_TABLET | Freq: Four times a day (QID) | ORAL | Status: DC | PRN

## 2018-02-04 MED ORDER — ENOXAPARIN SODIUM 40 MG/0.4ML ~~LOC~~ SOLN
40.0000 mg | SUBCUTANEOUS | Status: DC
  Administered 2018-02-05: 40 mg via SUBCUTANEOUS
  Filled 2018-02-04: qty 0.4

## 2018-02-04 MED ORDER — IRBESARTAN 150 MG PO TABS
75.0000 mg | ORAL_TABLET | Freq: Every day | ORAL | Status: DC
  Administered 2018-02-05: 75 mg via ORAL
  Filled 2018-02-04: qty 1

## 2018-02-04 MED ORDER — SODIUM CHLORIDE 0.9 % IV SOLN: 2.0000 g | Freq: Two times a day (BID) | INTRAVENOUS | Status: DC

## 2018-02-04 MED ORDER — AMLODIPINE BESYLATE 5 MG PO TABS
5.0000 mg | ORAL_TABLET | Freq: Every day | ORAL | Status: DC
  Administered 2018-02-05: 5 mg via ORAL
  Filled 2018-02-04: qty 1

## 2018-02-04 MED ORDER — SODIUM CHLORIDE 0.9 % IV SOLN
INTRAVENOUS | Status: DC | PRN
  Administered 2018-02-04: 250 mL via INTRAVENOUS

## 2018-02-04 MED ORDER — ONDANSETRON HCL 4 MG/2ML IJ SOLN: 4.0000 mg | Freq: Four times a day (QID) | INTRAMUSCULAR | Status: DC | PRN

## 2018-02-04 MED ORDER — BUPIVACAINE HCL (PF) 0.5 % IJ SOLN
10.0000 mL | Freq: Once | INTRAMUSCULAR | Status: AC
  Administered 2018-02-05: 10 mL
  Filled 2018-02-04: qty 10

## 2018-02-04 MED ORDER — SODIUM CHLORIDE 0.9 % IV SOLN
2.0000 g | Freq: Two times a day (BID) | INTRAVENOUS | Status: DC
  Administered 2018-02-04 – 2018-02-05 (×2): 2 g via INTRAVENOUS
  Filled 2018-02-04 (×3): qty 2

## 2018-02-04 MED ORDER — VANCOMYCIN HCL IN DEXTROSE 1-5 GM/200ML-% IV SOLN
1000.0000 mg | Freq: Once | INTRAVENOUS | Status: DC
  Filled 2018-02-04: qty 200

## 2018-02-04 MED ORDER — KETOROLAC TROMETHAMINE 15 MG/ML IJ SOLN
15.0000 mg | Freq: Four times a day (QID) | INTRAMUSCULAR | Status: DC | PRN
  Administered 2018-02-04: 15 mg via INTRAVENOUS
  Filled 2018-02-04: qty 1

## 2018-02-04 MED ORDER — SODIUM CHLORIDE 0.9 % IV BOLUS
1000.0000 mL | Freq: Once | INTRAVENOUS | Status: AC
  Administered 2018-02-04: 1000 mL via INTRAVENOUS

## 2018-02-04 MED ORDER — MORPHINE SULFATE (PF) 4 MG/ML IV SOLN
4.0000 mg | Freq: Once | INTRAVENOUS | Status: AC
  Administered 2018-02-04: 4 mg via INTRAVENOUS
  Filled 2018-02-04: qty 1

## 2018-02-04 MED ORDER — SODIUM CHLORIDE 0.9 % IV SOLN
INTRAVENOUS | Status: DC
  Administered 2018-02-04: 22:00:00 via INTRAVENOUS

## 2018-02-04 MED ORDER — TRIAMCINOLONE ACETONIDE 40 MG/ML IJ SUSP
40.0000 mg | Freq: Once | INTRAMUSCULAR | Status: AC
  Administered 2018-02-05: 40 mg via INTRA_ARTICULAR
  Filled 2018-02-04: qty 1

## 2018-02-04 MED ORDER — ONDANSETRON HCL 4 MG/2ML IJ SOLN
4.0000 mg | Freq: Once | INTRAMUSCULAR | Status: AC
  Administered 2018-02-04: 4 mg via INTRAVENOUS
  Filled 2018-02-04: qty 2

## 2018-02-04 MED ORDER — POLYETHYLENE GLYCOL 3350 17 G PO PACK: 17.0000 g | PACK | Freq: Every day | ORAL | Status: DC | PRN

## 2018-02-04 MED ORDER — AMLODIPINE-ATORVASTATIN 5-10 MG PO TABS: 1.0000 | ORAL_TABLET | Freq: Every day | ORAL | Status: DC

## 2018-02-04 MED ORDER — ACETAMINOPHEN 650 MG RE SUPP: 650.0000 mg | Freq: Four times a day (QID) | RECTAL | Status: DC | PRN

## 2018-02-04 SURGICAL SUPPLY — 27 items
BANDAGE ACE 4X5 VEL STRL LF (GAUZE/BANDAGES/DRESSINGS) IMPLANT
BLADE INCISOR PLUS 4.5 (BLADE) IMPLANT
CHLORAPREP W/TINT 26ML (MISCELLANEOUS) ×2 IMPLANT
COVER WAND RF STERILE (DRAPES) ×2 IMPLANT
CUFF TOURN 24 STER (MISCELLANEOUS) IMPLANT
CUFF TOURN 30 STER DUAL PORT (MISCELLANEOUS) IMPLANT
GAUZE SPONGE 4X4 12PLY STRL (GAUZE/BANDAGES/DRESSINGS) ×2 IMPLANT
GLOVE SURG SYN 9.0  PF PI (GLOVE) ×1
GLOVE SURG SYN 9.0 PF PI (GLOVE) ×1 IMPLANT
GOWN SRG 2XL LVL 4 RGLN SLV (GOWNS) ×1 IMPLANT
GOWN STRL NON-REIN 2XL LVL4 (GOWNS) ×1
GOWN STRL REUS W/ TWL LRG LVL3 (GOWN DISPOSABLE) ×2 IMPLANT
GOWN STRL REUS W/TWL LRG LVL3 (GOWN DISPOSABLE) ×2
IV LACTATED RINGER IRRG 3000ML (IV SOLUTION) ×2
IV LR IRRIG 3000ML ARTHROMATIC (IV SOLUTION) ×2 IMPLANT
KIT TURNOVER KIT A (KITS) ×2 IMPLANT
MANIFOLD NEPTUNE II (INSTRUMENTS) ×2 IMPLANT
NEEDLE HYPO 22GX1.5 SAFETY (NEEDLE) ×2 IMPLANT
PACK ARTHROSCOPY KNEE (MISCELLANEOUS) ×2 IMPLANT
SCALPEL PROTECTED #11 DISP (BLADE) ×2 IMPLANT
SET TUBE SUCT SHAVER OUTFL 24K (TUBING) ×2 IMPLANT
SET TUBE TIP INTRA-ARTICULAR (MISCELLANEOUS) ×2 IMPLANT
SUT ETHILON 4-0 (SUTURE) ×1
SUT ETHILON 4-0 FS2 18XMFL BLK (SUTURE) ×1
SUTURE ETHLN 4-0 FS2 18XMF BLK (SUTURE) ×1 IMPLANT
TUBING ARTHRO INFLOW-ONLY STRL (TUBING) ×2 IMPLANT
WAND COBLATION FLOW 50 (SURGICAL WAND) ×2 IMPLANT

## 2018-02-04 NOTE — Progress Notes (Signed)
Labs are now back in the show 45,000 WBC count 95% PMNs but there is also pyrophosphate crystals so this may just be pseudogout in the knee.  Initially the crystalline study was not back and so that we need to do an arthroscopy tonight but recommend we try a corticosteroid injection and see if that causes resolution of his symptoms if it does not he will need arthroscopic irrigation debridement

## 2018-02-04 NOTE — Op Note (Signed)
Procedure diagnosis is left knee fusion with pseudogout crystals Procedure diagnosis is same Procedure is aspiration and injection left knee After verifying patient and site the superior lateral aspect of the knee was prepped with alcohol and then chlorhexidine and 18-gauge needle was inserted with approximately 50 cc of cloudy fluid withdrawn.  Using the same needle 40 mill grams Kenalog and 5 cc half percent Sensorcaine was infiltrated in the knee to try to provide relief.  Gust with the patient that it is very uncommon to have both a pseudogout and septic joint at the same time although that is possible.  If he does not get relief he still may need arthroscopic irrigation and debridement

## 2018-02-04 NOTE — ED Notes (Signed)
Pt taken to [place] and then will go to Xray.

## 2018-02-04 NOTE — ED Notes (Signed)
See triage note  Presents with pain and swelling to left knee   Sx's started about 2 days ago   States he thought he may have had gout in past   Denies any injury  Knee is warm to touch and swollen

## 2018-02-04 NOTE — ED Provider Notes (Signed)
Medical screening examination/treatment/procedure(s) were conducted as a shared visit with non-physician practitioner(s) and myself.  I personally evaluated the patient during the encounter.  Patient reports pain improved after medication, he has notable edema from about the left knee down with a poorly healing ulcer over the anterior left shin, with purulent discharge erythema and tenderness surrounding.  The patient reports rapid advancement and edema and pain I am concerned about a rapidly progressing infection.  We will start broad-spectrum IV antibiotics, admit for further evaluation.  Patient does not have an elevated white count, but is slightly tachycardic and I am concerned about the possibility of rapidly spreading cellulitis.  His left knee joint does have a slight palpable effusion, but no overlying erythema and it is not tender to palpation.  Does have an obvious however redness edema in the lower left leg and I suspect likely his edema is secondary to this rapidly advancing infection.  Patient ultrasound pending to exclude DVT, discussed with the patient and will admit for further work-up.  Palpable dorsalis pedis pulse, but has slow capillary refill in the toes of the left foot which she reports is chronic.  I suspect he likely has chronic poor peripheral circulation as well as likely contributing to worsening of his infection, no evidence of acute arterial occlusion.    Delman Kitten, MD 02/04/18 1540

## 2018-02-04 NOTE — ED Provider Notes (Signed)
[facility] Emergency Department Provider Note  ____________________________________________   First MD Initiated Contact with Patient 02/04/18 1246     (approximate)  I have reviewed the triage vital signs and the nursing notes.   HISTORY  Chief Complaint Knee Pain    HPI Fernando [name] is a 62 y.o. male presents emergency department complaining of left leg pain.  He states he was seen by his regular doctor for a wound on the lower leg states they wrapped the leg too tight.  He feels like the swelling from the leg went up into the knee.  He denies fever chills.  States he is on antibiotic at this time.  He states that the left knee is very painful.  He has a history of gout and thinks this is similar to the gout attack.    Past Medical History:  Diagnosis Date  . Arthritis   . Cataract   . Hyperlipidemia     There are no active problems to display for this patient.   Past Surgical History:  Procedure Laterality Date  . COLONOSCOPY    . KNEE SURGERY  1975   left men. repair  . ROTATOR CUFF REPAIR     right  . UMBILICAL HERNIA REPAIR      Prior to Admission medications   Medication Sig Start Date End Date Taking? Authorizing Provider  amLODipine-atorvastatin (CADUET) 5-10 MG tablet Take 1 tablet by mouth daily.    [provider]  Multiple Vitamin (MULTIVITAMIN) tablet Take 1 tablet by mouth daily.      [provider]  telmisartan (MICARDIS) 20 MG tablet Take 80 mg by mouth daily.     [provider]    Allergies Patient has no known allergies.  Family History  Problem Relation Age of Onset  . Prostate cancer Father   . Colon cancer Neg Hx   . Esophageal cancer Neg Hx   . Stomach cancer Neg Hx   . Liver cancer Neg Hx   . Pancreatic cancer Neg Hx   . Rectal cancer Neg Hx     Social History Social History   Tobacco Use  . Smoking status: Current Every Day Smoker    Packs/day: 0.50    Types:  Cigarettes  . Smokeless tobacco: Never Used  Substance Use Topics  . Alcohol use: Yes    Alcohol/week: 6.0 standard drinks    Types: 6 Cans of beer per week    Comment: Socially  . Drug use: No    Review of Systems  Constitutional: No fever/chills Eyes: No visual changes. ENT: No sore throat. Respiratory: Denies cough Genitourinary: Negative for dysuria. Musculoskeletal: Negative for back pain.  Positive for left leg pain Skin: Negative for rash.    ____________________________________________   PHYSICAL EXAM:  VITAL SIGNS: ED Triage Vitals  Enc Vitals Group     BP 02/04/18 1154 (!) 144/64     Pulse Rate 02/04/18 1154 (!) 114     Resp 02/04/18 1154 18     Temp 02/04/18 1154 98.1 F (36.7 C)     Temp Source 02/04/18 1154 Oral     SpO2 02/04/18 1154 96 %     Weight 02/04/18 1155 166 lb (75.3 kg)     Height 02/04/18 1155 5' 8"  (1.727 m)     Head Circumference --      Peak Flow --      Pain Score 02/04/18 1155 3     Pain Loc --  Pain Edu? --      Excl. in [place]? --     Constitutional: Alert and oriented. Well appearing and in no acute distress. Eyes: Conjunctivae are normal.  Head: Atraumatic. Nose: No congestion/rhinnorhea. Mouth/Throat: Mucous membranes are moist.   Neck:  supple no lymphadenopathy noted Cardiovascular: Normal rate, regular rhythm. Heart sounds are normal Respiratory: Normal respiratory effort.  No retractions, lungs c t a  GU: deferred Musculoskeletal: Decreased range of motion of the left knee.  The left knee is tender to palpation.  There is swelling noted throughout the entire knee.  Neurovascular is intact.  Neurologic:  Normal speech and language.  Skin:  Skin is warm, dry and intact. No rash noted.  Positive for redness at the left knee and a wound that is covered on the left lower leg. Psychiatric: Mood and affect are normal. Speech and behavior are normal.  ____________________________________________   LABS (all labs ordered are  listed, but only abnormal results are displayed)  Labs Reviewed  COMPREHENSIVE METABOLIC PANEL - Abnormal; Notable for the following components:      Result Value   Glucose, Bld 107 (*)    All other components within normal limits  CBC WITH DIFFERENTIAL/PLATELET - Abnormal; Notable for the following components:   RBC 4.10 (*)    MCV 110.7 (*)    MCH 38.5 (*)    Monocytes Absolute 1.1 (*)    All other components within normal limits  FIBRIN DERIVATIVES D-DIMER ([facility] ONLY) - Abnormal; Notable for the following components:   Fibrin derivatives D-dimer ([facility]) 657.25 (*)    All other components within normal limits  AEROBIC CULTURE (SUPERFICIAL SPECIMEN)  CULTURE, BLOOD (ROUTINE X 2)  CULTURE, BLOOD (ROUTINE X 2)  URIC ACID  SEDIMENTATION RATE   ____________________________________________   ____________________________________________  RADIOLOGY  Ultrasound left lower leg negative for DVT X-ray left knee is negative for knee effusion X-ray of the left tib-fib  ____________________________________________   PROCEDURES  Procedure(s) performed: Saline lock, morphine 4 mg IV, Zofran 4 mg IV, Dilaudid 1 mg IV, Dilaudid 0.5 mg IV, vancomycin 1 g IV , Maxipime 2 g IV  Procedures    ____________________________________________   INITIAL IMPRESSION / ASSESSMENT AND PLAN / ED COURSE  Pertinent labs & imaging results that were available during my care of the patient were reviewed by me and considered in my medical decision making (see chart for details).   Patient is a 62 year old male presents emergency department complaining of redness and swelling to the left leg.  He is concerned because the knee is been swollen and he thinks he has gout.  He saw his doctor on Thursday for a wound that had been on his leg for 3 weeks.  He started him on doxycycline.  He has had 1 day of doxycycline without any relief.  He states he feels like the swelling from the lower leg moved up into the  knee.  He denies fever chills at this time.  On physical exam patient appears well.  He is afebrile.  The left lower leg has a worsening wound.  There is redness and swelling surrounding that with redness streaking up to the left knee.  The left knee is swollen and tender.  Remainder the exam is unremarkable.  X-ray of the left knee is negative, ultrasound of the left lower leg is negative for DVT X-ray of the left tib-fib  CBC has a normal WBC of 9.4, uric acid is negative at 4.8, d-dimer was elevated  at 657.25, comprehensive metabolic panel was normal.  Wound culture was obtained.  Dr. Delman Kitten in to see the patient.  Agrees due to worsening wound the patient should be admitted to the hospital.  He recommended vancomycin and Maxipime IV.  The hospitalist was paged and report was given.  The patient will be admitted for IV antibiotic therapy.     As part of my medical decision making, I reviewed the following data within the [facility] notes reviewed and incorporated, Labs reviewed CBC is normal, met C is normal, d-dimer elevated, uric acid normal, wound culture pending, Old chart reviewed, Radiograph reviewed x-ray of the left knee is negative, ultrasound of the left lower leg is negative, Discussed with admitting physician hospitalist, Evaluated by EM attending Dr. Jacqualine Code, Notes from prior ED visits and [place] Controlled Substance Database  ____________________________________________   FINAL CLINICAL IMPRESSION(S) / ED DIAGNOSES  Final diagnoses:  Cellulitis of knee, left  Post-traumatic wound infection      NEW MEDICATIONS STARTED DURING THIS VISIT:  New Prescriptions   No medications on file     Note:  This document was prepared using Dragon voice recognition software and may include unintentional dictation errors.    [name], PA-C 02/04/18 1545    Schaevitz, Randall An, MD 02/05/18 [phone]

## 2018-02-04 NOTE — ED Notes (Signed)
Pt resting in bed, pain med given per order, side rails up, bed locked and low, NAD.

## 2018-02-04 NOTE — ED Triage Notes (Signed)
Knee pain since yesterday. History of gout and states feels the same. Does have wound L anterior leg which has had trouble healing x 3 weeks since cut on van. Dressing over.

## 2018-02-04 NOTE — Consult Note (Signed)
Reason for consult: Possible septic left knee History patient is a 62 year old who scraped his left lower leg for a week ago on the a van door he suffered an abrasion with head swelling and possible infection.  He said it was wrapped by his doctor and he thinks that may have caused her to go up the leg he is now developed swelling and severe pain to the left knee.  He has limited motion of the knee and holds it in a proximally 35 degrees flexion and has severe pain with any attempted motion.  Distally he has an abrasion over the anterior leg approximately 2 cm in diameter with healing wound  With this setting the knee was aspirated and dictated separately with 95 cc of cloudy fluid withdrawn.  Will await labs but if this shows a septic joint we will plan on arthroscopy this evening

## 2018-02-04 NOTE — Progress Notes (Signed)
Pharmacy Antibiotic Note  Fernando [name] is a 62 y.o. male admitted on 02/04/2018 with wound infection.  Pharmacy has been consulted for Vancomycin, Cefepime  dosing.  Plan: Vancomycin 1250 mg IV X 1 ordered for 11/02 @ 1700. Vancomycin 1250 mg IV Q12H ordered to start on 11/02 @ 2300, ~ 6 hrs after 1st dose (stacked dosing). This pt will reach [facility] by 11/04 @ 1700. Will draw 1st VT on 11/04 @ 16:30, which should be at [facility].  Cefepime 2 gm IV Q12H ordered to start on 11/02 @ 16:30  CrCl = 88 ml/min Ke = 0.077 hr-1 T1/2 = 9 hrs Vd = 52.7 L   Height: 5\' 8"  (172.7 cm) Weight: 166 lb (75.3 kg) IBW/kg (Calculated) : 68.4  Temp (24hrs), Avg:98.1 F (36.7 C), Min:98.1 F (36.7 C), Max:98.1 F (36.7 C)  Recent Labs  Lab 02/04/18 1301  WBC 9.4  CREATININE 0.82    Estimated Creatinine Clearance: 88 mL/min (by C-G formula based on SCr of 0.82 mg/dL).    No Known Allergies  Antimicrobials this admission:   >>    >>   Dose adjustments this admission:   Microbiology results:  BCx:   UCx:    Sputum:    MRSA PCR:   Thank you for allowing pharmacy to be a part of this patient's care.  Kyaire Gruenewald D 02/04/2018 4:19 PM

## 2018-02-04 NOTE — ED Triage Notes (Signed)
First Nurse Note:  C/O left knee pain since Thursday.  Seen by PCP on Thursday for care of a cut to right lower leg.  After leg was wrapped, knee started to hurt.

## 2018-02-04 NOTE — Op Note (Signed)
Preprocedure diagnosis: Possible septic left knee Post procedure diagnosis: Same Procedure: Aspiration left knee Description of procedure: After prepping the skin superior laterally with alcohol and chlorhexidine and 18-gauge needle was inserted into the knee and 95 cc of cloudy fluid was withdrawn.  A Band-Aid was then applied with the synovial fluid sent to the lab for synovial fluid analysis stat cell count and Gram stain.

## 2018-02-04 NOTE — H&P (Signed)
[facility] [place] NAME: Fernando [name]    MR#:  [id number]  DATE OF BIRTH:  November 17, 1955  DATE OF ADMISSION:  02/04/2018  PRIMARY CARE PHYSICIAN: [name], MD   REQUESTING/REFERRING PHYSICIAN: Dr. Jacqualine Code  CHIEF COMPLAINT:   Chief Complaint  Patient presents with  . Knee Pain    HISTORY OF PRESENT ILLNESS:  Fernando [name]  is a 62 y.o. male with a known history of HTN, alcohol abuse here with left leg and knee pain with open ulcer of 3 weeks. Has been on PO abx for 1 day by PCP. Afebrile. Normal WBC.  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 Arthritis   . Cataract   . Hyperlipidemia     PAST SURGICAL HISTORY:   Past Surgical History:  Procedure Laterality Date  . COLONOSCOPY    . KNEE SURGERY  1975   left men. repair  . ROTATOR CUFF REPAIR     right  . UMBILICAL HERNIA REPAIR      SOCIAL HISTORY:   Social History   Tobacco Use  . Smoking status: Current Every Day Smoker    Packs/day: 0.50    Types: Cigarettes  . Smokeless tobacco: Never Used  Substance Use Topics  . Alcohol use: Yes    Alcohol/week: 6.0 standard drinks    Types: 6 Cans of beer per week    Comment: Socially    FAMILY HISTORY:   Family History  Problem Relation Age of Onset  . Prostate cancer Father   . Colon cancer Neg Hx   . Esophageal cancer Neg Hx   . Stomach cancer Neg Hx   . Liver cancer Neg Hx   . Pancreatic cancer Neg Hx   . Rectal cancer Neg Hx     DRUG ALLERGIES:  No Known Allergies  REVIEW OF SYSTEMS:   Review of Systems  Constitutional: Positive for malaise/fatigue. Negative for chills and fever.  HENT: Negative for sore throat.   Eyes: Negative for blurred vision, double vision and pain.  Respiratory: Negative for cough, hemoptysis, shortness of breath and wheezing.   Cardiovascular: Negative for chest pain, palpitations, orthopnea and leg swelling.   Gastrointestinal: Negative for abdominal pain, constipation, diarrhea, heartburn, nausea and vomiting.  Genitourinary: Negative for dysuria and hematuria.  Musculoskeletal: Positive for joint pain. Negative for back pain.  Skin: Negative for rash.  Neurological: Negative for sensory change, speech change, focal weakness and headaches.  Endo/Heme/Allergies: Does not bruise/bleed easily.  Psychiatric/Behavioral: Negative for depression. The patient is not nervous/anxious.     MEDICATIONS AT HOME:   Prior to Admission medications   Medication Sig Start Date End Date Taking? Authorizing Provider  amLODipine-atorvastatin (CADUET) 5-10 MG tablet Take 1 tablet by mouth daily.    [provider]  Multiple Vitamin (MULTIVITAMIN) tablet Take 1 tablet by mouth daily.      [provider]  telmisartan (MICARDIS) 20 MG tablet Take 80 mg by mouth daily.     [provider]     VITAL SIGNS:  Blood pressure (!) 144/64, pulse (!) 114, temperature 98.1 F (36.7 C), temperature source Oral, resp. rate 18, height 5\' 8"  (1.727 m), weight 75.3 kg, SpO2 96 %.  PHYSICAL EXAMINATION:  Physical Exam  GENERAL:  62 y.o.-year-old patient lying in the bed with no acute distress.  EYES: Pupils equal, round, reactive to light and accommodation. No  scleral icterus. Extraocular muscles intact.  HEENT: Head atraumatic, normocephalic. Oropharynx and nasopharynx clear. No oropharyngeal erythema, moist oral mucosa  NECK:  Supple, no jugular venous distention. No thyroid enlargement, no tenderness.  LUNGS: Normal breath sounds bilaterally, no wheezing, rales, rhonchi. No use of accessory muscles of respiration.  CARDIOVASCULAR: S1, S2 normal. No murmurs, rubs, or gallops.  ABDOMEN: Soft, nontender, nondistended. Bowel sounds present. No organomegaly or mass.  EXTREMITIES: No pedal edema, cyanosis, or clubbing.  Left leg superficial ulcer anteriorly with no discharge. Surrounding warmth and  redness Left knee swollen and warm. No erythema NEUROLOGIC: Cranial nerves II through XII are intact. No focal Motor or sensory deficits appreciated b/l PSYCHIATRIC: The patient is alert and oriented x 3. Good affect.  SKIN: No obvious rash, lesion, or ulcer.   LABORATORY PANEL:   CBC Recent Labs  Lab 02/04/18 1301  WBC 9.4  HGB 15.8  HCT 45.4  PLT 154   ------------------------------------------------------------------------------------------------------------------  Chemistries  Recent Labs  Lab 02/04/18 1301  NA 135  K 4.0  CL 98  CO2 25  GLUCOSE 107*  BUN 15  CREATININE 0.82  CALCIUM 9.4  AST 25  ALT 23  ALKPHOS 85  BILITOT 1.2   ------------------------------------------------------------------------------------------------------------------  Cardiac Enzymes No results for input(s): TROPONINI in the last 168 hours. ------------------------------------------------------------------------------------------------------------------  RADIOLOGY:  Dg Tibia/fibula Left  Result Date: 02/04/2018 CLINICAL DATA:  Knee pain since yesterday.  History of gout EXAM: LEFT TIBIA AND FIBULA - 2 VIEW COMPARISON:  None. FINDINGS: There is no evidence of fracture or other focal bone lesions. Tricompartmental osteoarthritis of the left knee. Large left knee joint effusion. Soft tissues are unremarkable. IMPRESSION: No acute osseous injury of the left tibia or fibula. Large left knee joint effusion. Tricompartmental osteoarthritis of the left knee. Electronically Signed   By: Kathreen Devoid   On: 02/04/2018 16:06   US Venous Img Lower Unilateral Left  Result Date: 02/04/2018 CLINICAL DATA:  Left leg pain and swelling for 2 days EXAM: LEFT LOWER EXTREMITY VENOUS DOPPLER ULTRASOUND TECHNIQUE: Gray-scale sonography with graded compression, as well as color Doppler and duplex ultrasound were performed to evaluate the lower extremity deep venous systems from the level of the common femoral  vein and including the common femoral, femoral, profunda femoral, popliteal and calf veins including the posterior tibial, peroneal and gastrocnemius veins when visible. The superficial great saphenous vein was also interrogated. Spectral Doppler was utilized to evaluate flow at rest and with distal augmentation maneuvers in the common femoral, femoral and popliteal veins. COMPARISON:  None. FINDINGS: Contralateral Common Femoral Vein: Respiratory phasicity is normal and symmetric with the symptomatic side. No evidence of thrombus. Normal compressibility. Common Femoral Vein: No evidence of thrombus. Normal compressibility, respiratory phasicity and response to augmentation. Saphenofemoral Junction: No evidence of thrombus. Normal compressibility and flow on color Doppler imaging. Profunda Femoral Vein: No evidence of thrombus. Normal compressibility and flow on color Doppler imaging. Femoral Vein: No evidence of thrombus. Normal compressibility, respiratory phasicity and response to augmentation. Popliteal Vein: No evidence of thrombus. Normal compressibility, respiratory phasicity and response to augmentation. Calf Veins: No evidence of thrombus. Normal compressibility and flow on color Doppler imaging. Superficial Great Saphenous Vein: No evidence of thrombus. Normal compressibility. Venous Reflux:  None. Other Findings:  None. IMPRESSION: No evidence of deep venous thrombosis. Electronically Signed   By: Inez Catalina M.D.   On: 02/04/2018 15:07   Dg Knee Complete 4 Views Left  Result Date: 02/04/2018 CLINICAL DATA:  Pain  and swelling in the left knee for 2 days EXAM: LEFT KNEE - COMPLETE 4+ VIEW COMPARISON:  None. FINDINGS: No acute fracture or dislocation. Generalized osteopenia. Mild tricompartmental osteoarthritis of the left knee with marginal osteophytes. Large joint effusion. IMPRESSION: 1.  No acute osseous injury of the left knee. 2. Large joint effusion. 3. Tricompartmental osteoarthritis of the  left knee. Electronically Signed   By: Kathreen Devoid   On: 02/04/2018 15:25     IMPRESSION AND PLAN:   * Left leg ulcer and cellulitis Start IV vancomycin and cefepime which he has received a dose in ED. Blood cx. Has used unknown PO abx for day at home.  * Left knee arthritis Doesn't seem likely septic arthritis. Tricompartmental effusion on xray. Will need it tapped for pain and also diagnosis. Pain meds PRN Discussed with Dr. Rudene Christians  * HTN Continue norvasc. IV meds if needed  * Alcohol abuse Drinks 2 beers a day and 2 mixed drinks. Start CIWA if needed  * DVT prophylaxis Lovenox  All the records are reviewed and case discussed with ED provider. Management plans discussed with the patient, family and they are in agreement.  CODE STATUS: FULL CODE  TOTAL TIME TAKING CARE OF THIS PATIENT: 40 minutes.   Neita Carp M.D on 02/04/2018 at 4:22 PM  Between 7am to 6pm - Pager - [phone]  After 6pm go to [email or web address] - password [email or web address] [facility]  Office  [phone]  CC: Primary care physician; [name], MD  Note: This dictation was prepared with Dragon dictation along with smaller phrase technology. Any transcriptional errors that result from this process are unintentional.

## 2018-02-05 DIAGNOSIS — I10 Essential (primary) hypertension: Secondary | ICD-10-CM | POA: Diagnosis not present

## 2018-02-05 DIAGNOSIS — M109 Gout, unspecified: Secondary | ICD-10-CM | POA: Diagnosis not present

## 2018-02-05 DIAGNOSIS — L03116 Cellulitis of left lower limb: Secondary | ICD-10-CM | POA: Diagnosis not present

## 2018-02-05 LAB — GRAM STAIN

## 2018-02-05 NOTE — Progress Notes (Signed)
  Subjective: 1 Day Post-Op Procedure(s) (LRB): IRRIGATION AND DEBRIDEMENT KNEE (N/A) Patient reports pain as minimal in the left knee.  Pain much improved following intra-articular knee steroid injection. Plan is to go Home after hospital stay. Negative for chest pain and shortness of breath Fever: no Gastrointestinal:Negative for nausea and vomiting  Objective: Vital signs in last 24 hours: Temp:  [98 F (36.7 C)-98.4 F (36.9 C)] 98.2 F (36.8 C) (11/03 0345) Pulse Rate:  [85-114] 111 (11/03 0345) Resp:  [16-20] 20 (11/03 0345) BP: (120-144)/(64-84) 144/84 (11/03 0345) SpO2:  [84 %-96 %] 92 % (11/03 0345) Weight:  [71 kg-75.3 kg] 71 kg (11/03 0345)  Intake/Output from previous day:  Intake/Output Summary (Last 24 hours) at 02/05/2018 0923 Last data filed at 02/05/2018 0644 Gross per 24 hour  Intake 1662.01 ml  Output 325 ml  Net 1337.01 ml    Intake/Output this shift: No intake/output data recorded.  Labs: Recent Labs    02/04/18 1301  HGB 15.8   Recent Labs    02/04/18 1301  WBC 9.4  RBC 4.10*  HCT 45.4  PLT 154   Recent Labs    02/04/18 1301  NA 135  K 4.0  CL 98  CO2 25  BUN 15  CREATININE 0.82  GLUCOSE 107*  CALCIUM 9.4   No results for input(s): LABPT, INR in the last 72 hours.   EXAM General - Patient is Alert, Appropriate and Oriented Extremity - ABD soft Sensation intact distally  Mild effusion present to the left knee, no erythema. Able to perform straight leg raise. Flexion and extension of the left knee with mild pain. Walking without assistance. Motor Function - intact, moving foot and toes well on exam.  Past Medical History:  Diagnosis Date  . Arthritis   . Cataract   . Hyperlipidemia     Assessment/Plan: 1 Day Post-Op Procedure(s) (LRB): IRRIGATION AND DEBRIDEMENT KNEE (N/A) Active Problems:   Left leg cellulitis  Estimated body mass index is 23.8 kg/m as calculated from the following:   Height as of this  encounter: 5\' 8"  (1.727 m).   Weight as of this encounter: 71 kg.   Left knee much improved after injection. Likely pseudogout flare vs. Septic knee. WBAT to the left leg. Upon discharge would recommend oral anti-inflammatory medication vs. Steroids.  Raquel Deette Revak, PA-C [facility] 02/05/2018, 9:23 AM

## 2018-02-05 NOTE — Progress Notes (Signed)
PT Cancellation Note  Patient Details Name: Fernando [name] MRN: [id number] DOB: September 03, 1955   Cancelled Treatment:    Reason Eval/Treat Not Completed: Patient declined PT eval stating he is back to his baseline with no PT needs, MD aware.  Will complete PT orders at this time but will reassess pt pending a change in status upon receipt of new PT orders.     Linus Salmons PT, DPT 02/05/18, 11:23 AM

## 2018-02-05 NOTE — Discharge Summary (Signed)
[facility] [place] NAME: Fernando [name]    MR#:  [id number]  DATE OF BIRTH:  06-13-1955  DATE OF ADMISSION:  02/04/2018 ADMITTING PHYSICIAN: Hillary Bow, MD  DATE OF DISCHARGE: 02/05/2018  PRIMARY CARE PHYSICIAN: [name], MD    ADMISSION DIAGNOSIS:  Post-traumatic wound infection [T14.8XXA, L08.9] Cellulitis of knee, left [L03.116]  DISCHARGE DIAGNOSIS:  left knee effusion status post aspiration and injection with Kenalog and sensorcaine left knee cellulitis status post abrasion after fall  SECONDARY DIAGNOSIS:   Past Medical History:  Diagnosis Date  . Arthritis   . Cataract   . Hyperlipidemia     HOSPITAL COURSE:  Fernando [name]  is a 62 y.o. male with a known history of HTN, alcohol abuse here with left leg and knee pain with open ulcer of 3 weeks. Has been on PO abx for 1 day by PCP. Afebrile.   * Left leg ulcer and cellulitis received IV vancomycin and cefepime which he has received a dose in ED. Blood cx negative Gram stain on left knee fluid shows WBCs. No organisms. -She was started on PO doxycycline by primary care physician which I have recommended him to complete. -Per Dr. Rudene Christians orthopedic patient had left knee aspiration along with injection with Kenalog and sensorcaine-- feels a lot better. IV antibiotics not recommended by ortho  * Left knee arthritis Doesn't seem likely septic arthritis. Tricompartmental effusion on xray. Pain meds PRN Discussed with Dr. Rudene Christians  * HTN Continue home meds  * Alcohol abuse Drinks 2 beers a day and 2 mixed drinks. No signs of withdrawal noted.  * DVT prophylaxis Lovenox  Overall feels better. Will discharge to home. Orthopedic okay with the plan. CONSULTS OBTAINED:  Treatment Team:  Hessie Knows, MD  DRUG ALLERGIES:  No Known Allergies  DISCHARGE MEDICATIONS:   Allergies as of 02/05/2018   No Known Allergies     Medication List    TAKE these  medications   amLODipine-atorvastatin 5-10 MG tablet Commonly known as:  CADUET Take 1 tablet by mouth daily.   multivitamin tablet Take 1 tablet by mouth daily.   telmisartan 20 MG tablet Commonly known as:  MICARDIS Take 80 mg by mouth daily.       If you experience worsening of your admission symptoms, develop shortness of breath, life threatening emergency, suicidal or homicidal thoughts you must seek medical attention immediately by calling 911 or calling your MD immediately  if symptoms less severe.  You Must read complete instructions/literature along with all the possible adverse reactions/side effects for all the Medicines you take and that have been prescribed to you. Take any new Medicines after you have completely understood and accept all the possible adverse reactions/side effects.   Please note  You were cared for by a hospitalist during your hospital stay. If you have any questions about your discharge medications or the care you received while you were in the hospital after you are discharged, you can call the unit and asked to speak with the hospitalist on call if the hospitalist that took care of you is not available. Once you are discharged, your primary care physician will handle any further medical issues. Please note that NO REFILLS for any discharge medications will be authorized once you are discharged, as it is imperative that you return to your primary care physician (or establish a relationship with a primary care physician if you do not have one) for your aftercare needs  so that they can reassess your need for medications and monitor your lab values. Today   SUBJECTIVE   Left knee feels better VITAL SIGNS:  Blood pressure (!) 144/84, pulse (!) 111, temperature 98.2 F (36.8 C), temperature source Oral, resp. rate 20, height 5\' 8"  (1.727 m), weight 71 kg, SpO2 92 %.  I/O:    Intake/Output Summary (Last 24 hours) at 02/05/2018 1037 Last data filed at  02/05/2018 0929 Gross per 24 hour  Intake 1902.01 ml  Output 325 ml  Net 1577.01 ml    PHYSICAL EXAMINATION:  GENERAL:  63 y.o.-year-old patient lying in the bed with no acute distress.  EYES: Pupils equal, round, reactive to light and accommodation. No scleral icterus. Extraocular muscles intact.  HEENT: Head atraumatic, normocephalic. Oropharynx and nasopharynx clear.  NECK:  Supple, no jugular venous distention. No thyroid enlargement, no tenderness.  LUNGS: Normal breath sounds bilaterally, no wheezing, rales,rhonchi or crepitation. No use of accessory muscles of respiration.  CARDIOVASCULAR: S1, S2 normal. No murmurs, rubs, or gallops.  ABDOMEN: Soft, non-tender, non-distended. Bowel sounds present. No organomegaly or mass.  EXTREMITIES: No pedal edema, cyanosis, or clubbing. Abrasion below left knee  NEUROLOGIC: Cranial nerves II through XII are intact. Muscle strength 5/5 in all extremities. Sensation intact. Gait not checked.  PSYCHIATRIC: The patient is alert and oriented x 3.  SKIN: No obvious rash, lesion, or ulcer.   DATA REVIEW:   CBC  Recent Labs  Lab 02/04/18 1301  WBC 9.4  HGB 15.8  HCT 45.4  PLT 154    Chemistries  Recent Labs  Lab 02/04/18 1301  NA 135  K 4.0  CL 98  CO2 25  GLUCOSE 107*  BUN 15  CREATININE 0.82  CALCIUM 9.4  AST 25  ALT 23  ALKPHOS 85  BILITOT 1.2    Microbiology Results   Recent Results (from the past 240 hour(s))  Wound or Superficial Culture     Status: None (Preliminary result)   Collection Time: 02/04/18  4:07 PM  Result Value Ref Range Status   Specimen Description   Final    LEG Performed at [facility], [address], [place], [place] [address]    Special Requests   Final    NONE Performed at [facility], [address], [place], [place] [address]    Gram Stain   Final    NO WBC SEEN NO ORGANISMS SEEN Performed at [facility], [place] [address] [place], [place] [address]     Culture PENDING  Incomplete   Report Status PENDING  Incomplete  CULTURE, BLOOD (ROUTINE X 2) w Reflex to ID Panel     Status: None (Preliminary result)   Collection Time: 02/04/18  4:07 PM  Result Value Ref Range Status   Specimen Description BLOOD R WRIST  Final   Special Requests   Final    BOTTLES DRAWN AEROBIC AND ANAEROBIC Blood Culture adequate volume   Culture   Final    NO GROWTH < 12 HOURS Performed at [facility], [address], [place], [place] [address]    Report Status PENDING  Incomplete  Culture, body fluid-bottle     Status: None (Preliminary result)   Collection Time: 02/04/18  4:45 PM  Result Value Ref Range Status   Specimen Description FLUID SYNOVIAL LEFT KNEE  Final   Special Requests   Final    BOTTLES DRAWN AEROBIC ONLY Blood Culture adequate volume   Culture PENDING  Incomplete  Report Status PENDING  Incomplete  Gram stain     Status: None   Collection Time: 02/04/18  4:45 PM  Result Value Ref Range Status   Specimen Description FLUID SYNOVIAL LEFT KNEE  Final   Special Requests NONE  Final   Gram Stain   Final    ABUNDANT WBC PRESENT, PREDOMINANTLY PMN NO ORGANISMS SEEN Performed at [facility], [address], [place], [place] [address]    Report Status 02/05/2018 FINAL  Final  CULTURE, BLOOD (ROUTINE X 2) w Reflex to ID Panel     Status: None (Preliminary result)   Collection Time: 02/04/18  5:36 PM  Result Value Ref Range Status   Specimen Description BLOOD LAC  Final   Special Requests   Final    BOTTLES DRAWN AEROBIC AND ANAEROBIC Blood Culture adequate volume   Culture   Final    NO GROWTH < 12 HOURS Performed at [facility], [address], [place], [place] [address]    Report Status PENDING  Incomplete    RADIOLOGY:  Dg Tibia/fibula Left  Result Date: 02/04/2018 CLINICAL DATA:  Knee pain since yesterday.  History of gout EXAM: LEFT TIBIA AND FIBULA - 2 VIEW COMPARISON:  None. FINDINGS: There is no  evidence of fracture or other focal bone lesions. Tricompartmental osteoarthritis of the left knee. Large left knee joint effusion. Soft tissues are unremarkable. IMPRESSION: No acute osseous injury of the left tibia or fibula. Large left knee joint effusion. Tricompartmental osteoarthritis of the left knee. Electronically Signed   By: Kathreen Devoid   On: 02/04/2018 16:06   US Venous Img Lower Unilateral Left  Result Date: 02/04/2018 CLINICAL DATA:  Left leg pain and swelling for 2 days EXAM: LEFT LOWER EXTREMITY VENOUS DOPPLER ULTRASOUND TECHNIQUE: Gray-scale sonography with graded compression, as well as color Doppler and duplex ultrasound were performed to evaluate the lower extremity deep venous systems from the level of the common femoral vein and including the common femoral, femoral, profunda femoral, popliteal and calf veins including the posterior tibial, peroneal and gastrocnemius veins when visible. The superficial great saphenous vein was also interrogated. Spectral Doppler was utilized to evaluate flow at rest and with distal augmentation maneuvers in the common femoral, femoral and popliteal veins. COMPARISON:  None. FINDINGS: Contralateral Common Femoral Vein: Respiratory phasicity is normal and symmetric with the symptomatic side. No evidence of thrombus. Normal compressibility. Common Femoral Vein: No evidence of thrombus. Normal compressibility, respiratory phasicity and response to augmentation. Saphenofemoral Junction: No evidence of thrombus. Normal compressibility and flow on color Doppler imaging. Profunda Femoral Vein: No evidence of thrombus. Normal compressibility and flow on color Doppler imaging. Femoral Vein: No evidence of thrombus. Normal compressibility, respiratory phasicity and response to augmentation. Popliteal Vein: No evidence of thrombus. Normal compressibility, respiratory phasicity and response to augmentation. Calf Veins: No evidence of thrombus. Normal compressibility  and flow on color Doppler imaging. Superficial Great Saphenous Vein: No evidence of thrombus. Normal compressibility. Venous Reflux:  None. Other Findings:  None. IMPRESSION: No evidence of deep venous thrombosis. Electronically Signed   By: Inez Catalina M.D.   On: 02/04/2018 15:07   Dg Knee Complete 4 Views Left  Result Date: 02/04/2018 CLINICAL DATA:  Pain and swelling in the left knee for 2 days EXAM: LEFT KNEE - COMPLETE 4+ VIEW COMPARISON:  None. FINDINGS: No acute fracture or dislocation. Generalized osteopenia. Mild tricompartmental osteoarthritis of the left knee with marginal osteophytes. Large joint effusion. IMPRESSION: 1.  No  acute osseous injury of the left knee. 2. Large joint effusion. 3. Tricompartmental osteoarthritis of the left knee. Electronically Signed   By: Kathreen Devoid   On: 02/04/2018 15:25     Management plans discussed with the patient, family and they are in agreement.  CODE STATUS:     Code Status Orders  (From admission, onward)         Start     Ordered   02/04/18 1607  Full code  Continuous     02/04/18 1608        Code Status History    This patient has a current code status but no historical code status.      TOTAL TIME TAKING CARE OF THIS PATIENT: *40 minutes.    Fritzi Mandes M.D on 02/05/2018 at 10:37 AM  Between 7am to 6pm - Pager - [phone] After 6pm go to [email or web address] - password [email or web address] [facility]  Office  [phone]  CC: Primary care physician; [name], MD

## 2018-02-05 NOTE — Progress Notes (Signed)
Discharge order received. Patient is alert and oriented. Vital signs stable . No signs of acute distress. Discharge instructions given. Patient verbalized understanding. No other issues noted at this time.   

## 2018-02-06 LAB — HIV ANTIBODY (ROUTINE TESTING W REFLEX): HIV Screen 4th Generation wRfx: NONREACTIVE

## 2018-02-07 LAB — AEROBIC CULTURE W GRAM STAIN (SUPERFICIAL SPECIMEN)
Culture: NO GROWTH
Gram Stain: NONE SEEN

## 2018-02-07 LAB — AEROBIC CULTURE  (SUPERFICIAL SPECIMEN)

## 2018-02-09 LAB — CULTURE, BLOOD (ROUTINE X 2)
Culture: NO GROWTH
Culture: NO GROWTH
Special Requests: ADEQUATE
Special Requests: ADEQUATE

## 2018-02-10 LAB — CULTURE, BODY FLUID W GRAM STAIN -BOTTLE
Culture: NO GROWTH
Special Requests: ADEQUATE

## 2018-02-10 LAB — ANAEROBIC CULTURE

## 2018-03-15 DIAGNOSIS — M109 Gout, unspecified: Secondary | ICD-10-CM | POA: Diagnosis not present

## 2018-03-15 DIAGNOSIS — M25562 Pain in left knee: Secondary | ICD-10-CM | POA: Diagnosis not present

## 2018-03-15 DIAGNOSIS — M1712 Unilateral primary osteoarthritis, left knee: Secondary | ICD-10-CM | POA: Diagnosis not present

## 2018-04-05 HISTORY — PX: TOTAL SHOULDER REPLACEMENT: SUR1217

## 2018-04-11 DIAGNOSIS — S43004A Unspecified dislocation of right shoulder joint, initial encounter: Secondary | ICD-10-CM | POA: Diagnosis not present

## 2018-04-14 DIAGNOSIS — M25511 Pain in right shoulder: Secondary | ICD-10-CM | POA: Diagnosis not present

## 2018-04-20 DIAGNOSIS — Z1159 Encounter for screening for other viral diseases: Secondary | ICD-10-CM | POA: Diagnosis not present

## 2018-04-20 DIAGNOSIS — M25511 Pain in right shoulder: Secondary | ICD-10-CM | POA: Diagnosis not present

## 2018-04-20 DIAGNOSIS — B9689 Other specified bacterial agents as the cause of diseases classified elsewhere: Secondary | ICD-10-CM | POA: Diagnosis not present

## 2018-04-25 DIAGNOSIS — I10 Essential (primary) hypertension: Secondary | ICD-10-CM | POA: Diagnosis not present

## 2018-04-25 DIAGNOSIS — I4510 Unspecified right bundle-branch block: Secondary | ICD-10-CM | POA: Diagnosis not present

## 2018-04-25 DIAGNOSIS — F17200 Nicotine dependence, unspecified, uncomplicated: Secondary | ICD-10-CM | POA: Diagnosis not present

## 2018-05-05 NOTE — Pre-Procedure Instructions (Signed)
Fernando [name]  05/05/2018      [id number] Lorina Rabon, [place] [address] [place] [address] Phone: [phone] Fax: [phone]    Your procedure is scheduled on May 17, 2018.  Report to [facility] Admitting at 630 AM.  Call this number if you have problems the morning of surgery:  [phone]   Remember:  Do not eat or drink after midnight.    Take these medicines the morning of surgery with A SIP OF WATER  Amlodipine-atorvastatin (Caduet)  7 days prior to surgery STOP taking any Aspirin (unless otherwise instructed by your surgeon), Aleve, Naproxen, Ibuprofen, Motrin, Advil, Goody's, BC's, all herbal medications, fish oil, and all vitamins    Do not wear jewelry  Do not wear lotions, powders, or colognes, or deodorant.  Men may shave face and neck.  Do not bring valuables to the hospital.  [facility] is not responsible for any belongings or valuables.  Contacts, dentures or bridgework may not be worn into surgery.  Leave your suitcase in the car.  After surgery it may be brought to your room.  For patients admitted to the hospital, discharge time will be determined by your treatment team.  Patients discharged the day of surgery will not be allowed to drive home.    [place]- Preparing For Surgery  Before surgery, you can play an important role. Because skin is not sterile, your skin needs to be as free of germs as possible. You can reduce the number of germs on your skin by washing with CHG (chlorahexidine gluconate) Soap before surgery.  CHG is an antiseptic cleaner which kills germs and bonds with the skin to continue killing germs even after washing.    Oral Hygiene is also important to reduce your risk of infection.  Remember - BRUSH YOUR TEETH THE MORNING OF SURGERY WITH YOUR REGULAR TOOTHPASTE  Please do not use if you have an allergy to CHG or antibacterial soaps. If your skin becomes reddened/irritated  stop using the CHG.  Do not shave (including legs and underarms) for at least 48 hours prior to first CHG shower. It is OK to shave your face.  Please follow these instructions carefully.   1. Shower the NIGHT BEFORE SURGERY and the MORNING OF SURGERY with CHG.   2. If you chose to wash your hair, wash your hair first as usual with your normal shampoo.  3. After you shampoo, rinse your hair and body thoroughly to remove the shampoo.  4. Use CHG as you would any other liquid soap. You can apply CHG directly to the skin and wash gently with a scrungie or a clean washcloth.   5. Apply the CHG Soap to your body ONLY FROM THE NECK DOWN.  Do not use on open wounds or open sores. Avoid contact with your eyes, ears, mouth and genitals (private parts). Wash Face and genitals (private parts)  with your normal soap.  6. Wash thoroughly, paying special attention to the area where your surgery will be performed.  7. Thoroughly rinse your body with warm water from the neck down.  8. DO NOT shower/wash with your normal soap after using and rinsing off the CHG Soap.  9. Pat yourself dry with a CLEAN TOWEL.  10. Wear CLEAN PAJAMAS to bed the night before surgery, wear comfortable clothes the morning of surgery  11. Place CLEAN SHEETS on your bed the night of your first shower and DO  NOT SLEEP WITH PETS.  Day of Surgery:  Do not apply any deodorants/lotions.  Please wear clean clothes to the hospital/surgery center.   Remember to brush your teeth WITH YOUR REGULAR TOOTHPASTE.  Please read over the following fact sheets that you were given.

## 2018-05-08 ENCOUNTER — Other Ambulatory Visit: Payer: Self-pay

## 2018-05-08 ENCOUNTER — Encounter (HOSPITAL_COMMUNITY): Payer: Self-pay

## 2018-05-08 ENCOUNTER — Inpatient Hospital Stay (HOSPITAL_COMMUNITY)
Admission: RE | Admit: 2018-05-08 | Discharge: 2018-05-08 | Disposition: A | Discharge status: Home / Self Care | Payer: 59 | Source: Ambulatory Visit

## 2018-05-08 ENCOUNTER — Encounter (HOSPITAL_COMMUNITY)
Admission: RE | Admit: 2018-05-08 | Discharge: 2018-05-08 | Disposition: A | Discharge status: Home / Self Care | Payer: 59 | Source: Ambulatory Visit | Attending: Orthopaedic Surgery | Admitting: Orthopaedic Surgery

## 2018-05-08 ENCOUNTER — Encounter (HOSPITAL_COMMUNITY): Payer: Self-pay | Admitting: Vascular Surgery

## 2018-05-08 DIAGNOSIS — Z01818 Encounter for other preprocedural examination: Secondary | ICD-10-CM | POA: Diagnosis present

## 2018-05-08 HISTORY — DX: Pneumonia, unspecified organism: J18.9

## 2018-05-08 HISTORY — DX: Personal history of urinary calculi: Z87.442

## 2018-05-08 HISTORY — DX: Essential (primary) hypertension: I10

## 2018-05-08 LAB — COMPREHENSIVE METABOLIC PANEL
ALT: 16 U/L (ref 0–44)
AST: 22 U/L (ref 15–41)
Albumin: 3.8 g/dL (ref 3.5–5.0)
Alkaline Phosphatase: 71 U/L (ref 38–126)
Anion gap: 13 (ref 5–15)
BUN: 12 mg/dL (ref 8–23)
CO2: 23 mmol/L (ref 22–32)
Calcium: 9.5 mg/dL (ref 8.9–10.3)
Chloride: 100 mmol/L (ref 98–111)
Creatinine, Ser: 0.92 mg/dL (ref 0.61–1.24)
GFR calc Af Amer: >60 mL/min (ref >60)
GFR calc non Af Amer: >60 mL/min (ref >60)
Glucose, Bld: 109 mg/dL — ABNORMAL HIGH (ref 70–99)
Potassium: 4.5 mmol/L (ref 3.5–5.1)
Sodium: 136 mmol/L (ref 135–145)
Total Bilirubin: 0.9 mg/dL (ref 0.3–1.2)
Total Protein: 7.3 g/dL (ref 6.5–8.1)

## 2018-05-08 LAB — CBC
HCT: 43.3 % (ref 39.0–52.0)
Hemoglobin: 14.3 g/dL (ref 13.0–17.0)
MCH: 37.9 pg — ABNORMAL HIGH (ref 26.0–34.0)
MCHC: 33 g/dL (ref 30.0–36.0)
MCV: 114.9 fL — ABNORMAL HIGH (ref 80.0–100.0)
Platelets: 159 10*3/uL (ref 150–400)
RBC: 3.77 MIL/uL — ABNORMAL LOW (ref 4.22–5.81)
RDW: 13 % (ref 11.5–15.5)
WBC: 10.4 10*3/uL (ref 4.0–10.5)
nRBC: 0 % (ref 0.0–0.2)

## 2018-05-08 LAB — SURGICAL PCR SCREEN
MRSA, PCR: NEGATIVE
Staphylococcus aureus: NEGATIVE

## 2018-05-08 NOTE — Progress Notes (Signed)
Pt came in for a PAT appt and was very shaky and heart rate was elevated. He states he's is always told his heart rate is fast. EKG done. When asked if he was always as shaky as he is today, he stated "well I think I might have had too much to drink last night during the Super Bowl game". I asked him how much he had to drink and he stated " at least 4 orange juice and vodkas". Pt's face is flushed. Pt also showed me his left leg and it is swollen red and oozing clear fluid. He states it swells up and then goes down. He said it was due to a wound he had in November. He states he tries to keep his leg elevated but is having to sleep in a recliner due to his shoulder pain. I have requested our Anesthesia PA to see pt before he leaves and I have requested last office visit note and EKG from Dr. Danna Hefty office. He saw [name] on 04/25/2018.

## 2018-05-08 NOTE — Progress Notes (Addendum)
Anesthesia PAT Evaluation:   Case:  [id number] Date/Time:  05/17/18 0815   Procedure:  REVERSE SHOULDER ARTHROPLASTY (Right )   Anesthesia type:  Choice   Pre-op diagnosis:  djd right shoulder   Location:  [facility] OR ROOM 06 / [facility]   Surgeon:  Hiram Gash, MD      DISCUSSION: Patient is a 20 year scheduled for the above procedure.  History includes smoking (1/2 ppd), HLD, HTN. He admits to drinking 2 beers/day and occasional liquor on the weekends--he reported 4 OJ/vodka drinks during the Super Bowl on 05/07/18. - He denied history of alcohol withdrawal, although PCP 04/25/18 note indicates that in April of 2010 he was hospitalized with CAP with acute respiratory distress and dysphagia and delirium with hospital course complicated by alcohol withdrawal with agitation and seizures. Note also states that his hepatitis C screen (HCV Ab) was reactive, but that his hepatitis C RNA by PCR was negative (07/31/08).  - Admission 02/04/18-02/05/18 for post-traumatic wound infection and left knee cellulitis. He reports that he cut his left shin on a fan door in the fall while on a golf trip. He was apparently being treated with doxycycline for the left shin wound but then developed a left knee effusion. He had a left knee aspiration along with injection with Kenalog and Sensorcaine. Left knee aspiration with pseudogout crystals. Knee aspirate and blood cultures were negative. He received IV antibiotics during admission and was discharged home to complete course of previously prescribed doxycycline.  Called to evaluate patient prior to him leaving PAT due to tachycardia, tremor, and LLE edema with mild drainage. Patient reports somewhat of a chronic tremor, but felt a little worse today. Also reports tends to be tachycardic (HR 110 bpm at 04/25/18 PCP visit), but up to 131 bpm at PAT visit. He denied history of ETOH withdrawal, but did report regular ETOH use with 4 OJ/vodkas last night. He denied illicit drug use. He is  also anxious to get out of the hospital/PAT. He has chronic LLE edema since his fall leg injury, but admits probably worse since his shoulder injury ~ 3 weeks ago because he is having to sleep in a recliner and can't keep his legs elevated. LLE venous Duplex in 02/2018 was negative for DVT. He said his LLE leg has been red for several weeks now. He has had mild drainage, but not new. He denied fever. He denied chest pain and SOB. No pleuritic chest pain. (See below for exam findings.) He does not think Dr. Griffin Basil has seen LLE and did not recall to what extent it was evaluated during preoperative clearance visit.   Discussed multiple concerns during his PAT visit including tachycardia, ETOH intake, LLE erythema swelling. Even at his recent PCP visit HR 95-110 bpm, but up to 130's at PAT. He did not appear to be in any acute distress during my evaluation, but discussed limited scope of our PAT Clinic and that although tachycardia could be related to his ETOH use, anxiety, and known baseline faster HR (95-110 bpm at recent PCP visit), that I would not exclude tachycardia related to infection (LLE cellulitis) and/or LLE DVT with or without PE that could be potentially life threatening. In addition, I discussed if any question of DVT or cellulitis in his LLE that Dr. Griffin Basil would likely delay surgery, so it was important to get it evaluated prior to surgery. Dr. Griffin Basil and [name] are both out of the office today 05/08/18. There were no available  appointments for him to be seen at [facility] prior to 05/09/18. Per my discussion with anesthesiologist Oleta Mouse, MD, I discussed with patient recommendation to go to the ED for further evaluation (for the reasons mentioned) since PCP or surgeon could not further evaluate today. Patient was without chest pain, SOB, hypoxia, palpitations, dizziness, and he felt his leg although more swollen and red over the past few weeks was not acutely changed--so he  respectfully declined to go to the ED. He was willing to be seen at his PCP on 05/09/18, so staff did make him an appointment on 05/09/18 at 1:30 PM. In the interim, I reviewed symptoms that would warrant contacting EMS. I also asked patient to discuss with PCP during 05/09/18 visit potential plan while he is off alcohol perioperatively (benzodizepine may need to be considered). I have updated Judeen Hammans at Dr. Rich Fuchs office. He is back in the office on 05/10/18 and will need to either follow-up with PCP or patient prior to surgery.   ADDENDUM 05/10/18 3:07 PM: Dr. Griffin Basil has reviewed my note and will plan to re-examine patient's legs. He was re-evaluated on 05/09/18 by Caprice Beaver, NP and started on 10 day course of doxycycline for LLE cellulitis. CBC repeated with normal WBC. HR was documented as regular at 120 bpm. No additional medication adjustments made.   VS: BP 134/72   Pulse (!) 131   Temp 36.7 C (Oral)   Resp 16   Ht 5\' 8"  (1.727 m)   Wt 78.7 kg   SpO2 97%   BMI 26.40 kg/m  Heart regular, tachycardic in the 120's. No murmur noted. Lungs clear. No conversational dyspnea. He has a fine resting tremor noted in his hands. Speech is clear--no slurring. He is A&O x4. Gait is overall steady--no staggering.  LLE with 1-2+ pitting edema. He is wearing a white sock that has scant serosanguinous drainage for tiny scattered excoriatic lesions along his LLE (shin region). There is a thin eschar at the site of his reported initial laceration over his left shin area. No active drainage. There is generalized erythema. Negative Homan's sign on the LLE.     PROVIDERS: [name], MD is PCP [facility], [phone]). Seen by Caprice Beaver, NP on 04/25/18 for preoperative evaluation. She wrote, "Based on history and exam today and given lack of risk factors and adequate functional capacity, pt is at low risk for surgical complications." EKG then showed SR at 95 bpm, RBBB (old). HR with vitals that  day was 110 bpm .     LABS: Preoperative CMET and CBC noted. Cr 0.92, AST/ALT WNL. Glucose 109. WBC 10.4, H/H 14.3/43.3, PLT 159K. (all labs ordered are listed, but only abnormal results are displayed)  Labs Reviewed  CBC  COMPREHENSIVE METABOLIC PANEL    EKG: 07/08/94: ST at 126 bpm. Right BBB.   CV: LLE venous U/S 02/04/18: IMPRESSION: No evidence of deep venous thrombosis.   Past Medical History:  Diagnosis Date  . Arthritis   . Cataract   . History of kidney stones   . Hyperlipidemia   . Hypertension   . Pneumonia     Past Surgical History:  Procedure Laterality Date  . COLONOSCOPY    . KNEE SURGERY  1975   left men. repair  . ROTATOR CUFF REPAIR     right  . UMBILICAL HERNIA REPAIR      MEDICATIONS: . ALPRAZolam (XANAX) 0.5 MG tablet  . amLODipine (NORVASC) 5 MG tablet  . telmisartan (  MICARDIS) 80 MG tablet   No current facility-administered medications for this encounter.     Myra Gianotti, PA-C Surgical Short Stay/Anesthesiology [facility], [place] Phone [phone] [facility] Phone [phone] 05/08/2018 2:37 PM

## 2018-05-08 NOTE — Pre-Procedure Instructions (Addendum)
Fernando [name]  05/08/2018    Your procedure is scheduled on Wednesday, May 17, 2018 at 8:30 AM.   Report to [facility] Entrance "A" Admitting Office at 6:30 AM.   Call this number if you have problems the morning of surgery: [phone]   Questions prior to day of surgery, please call [phone] between 8 & 4 PM.   Remember:  Do not eat or drink after midnight Tuesday, 05/16/18.  Take these medicines the morning of surgery with A SIP OF WATER: Amlodipine (Norvasc), Alprazolam (Xanax) - if needed  Do not use NSAIDS (Ibuprofen, Aleve, etc), Aspirin products (Goody's, BC Powders, etc) or Herbal medications 7 days prior to surgery.  No smoking 24 hours prior to surgery.    Do not wear jewelry.  Do not wear lotions, powders, cologne or deodorant.  Men may shave face and neck.  Do not bring valuables to the hospital.  [facility] is not responsible for any belongings or valuables.  Contacts, dentures or bridgework may not be worn into surgery.  Leave your suitcase in the car.  After surgery it may be brought to your room.  For patients admitted to the hospital, discharge time will be determined by your treatment team.   [facility] - Preparing for Surgery  Before surgery, you can play an important role.  Because skin is not sterile, your skin needs to be as free of germs as possible.  You can reduce the number of germs on you skin by washing with CHG (chlorahexidine gluconate) soap before surgery.  CHG is an antiseptic cleaner which kills germs and bonds with the skin to continue killing germs even after washing.  Oral Hygiene is also important in reducing the risk of infection.  Remember to brush your teeth with your regular toothpaste the morning of surgery.  Please DO NOT use if you have an allergy to CHG or antibacterial soaps.  If your skin becomes reddened/irritated stop using the CHG and inform your nurse when you arrive at Short Stay.  Do not shave (including  legs and underarms) for at least 48 hours prior to the first CHG shower.  You may shave your face.  Please follow these instructions carefully:   1.  Shower with CHG Soap the night before surgery and the morning of Surgery.  2.  If you choose to wash your hair, wash your hair first as usual with your normal shampoo.  3.  After you shampoo, rinse your hair and body thoroughly to remove the shampoo. 4.  Use CHG as you would any other liquid soap.  You can apply chg directly to the skin and wash gently with a      scrungie or washcloth.           5.  Apply the CHG Soap to your body ONLY FROM THE NECK DOWN.   Do not use on open wounds or open sores. Avoid contact with your eyes, ears, mouth and genitals (private parts).  Wash genitals (private parts) with your normal soap.  6.  Wash thoroughly, paying special attention to the area where your surgery will be performed.  7.  Thoroughly rinse your body with warm water from the neck down.  8.  DO NOT shower/wash with your normal soap after using and rinsing off the CHG Soap.  9.  Pat yourself dry with a clean towel.            10.  Wear clean pajamas.  11.  Place clean sheets on your bed the night of your first shower and do not sleep with pets.  Day of Surgery  Shower as above. Do not apply any lotions/deodorants the morning of surgery.   Please wear clean clothes to the hospital. Remember to brush your teeth with toothpaste.   Please read over the fact sheets that you were given.

## 2018-05-09 DIAGNOSIS — I10 Essential (primary) hypertension: Secondary | ICD-10-CM | POA: Diagnosis not present

## 2018-05-09 DIAGNOSIS — Z6827 Body mass index (BMI) 27.0-27.9, adult: Secondary | ICD-10-CM | POA: Diagnosis not present

## 2018-05-09 DIAGNOSIS — L03116 Cellulitis of left lower limb: Secondary | ICD-10-CM | POA: Diagnosis not present

## 2018-05-17 ENCOUNTER — Ambulatory Visit (HOSPITAL_COMMUNITY): Admission: RE | Admit: 2018-05-17 | Payer: 59 | Source: Home / Self Care | Admitting: Orthopaedic Surgery

## 2018-05-17 ENCOUNTER — Encounter (HOSPITAL_COMMUNITY): Admission: RE | Payer: Self-pay | Source: Home / Self Care

## 2018-05-17 SURGERY — ARTHROPLASTY, SHOULDER, TOTAL, REVERSE
Anesthesia: Choice | Laterality: Right

## 2018-05-22 ENCOUNTER — Other Ambulatory Visit (HOSPITAL_COMMUNITY): Payer: Self-pay | Admitting: Internal Medicine

## 2018-05-22 ENCOUNTER — Ambulatory Visit (HOSPITAL_COMMUNITY)
Admission: RE | Admit: 2018-05-22 | Discharge: 2018-05-22 | Disposition: A | Discharge status: Home / Self Care | Payer: 59 | Source: Ambulatory Visit | Attending: Internal Medicine | Admitting: Internal Medicine

## 2018-05-22 DIAGNOSIS — R609 Edema, unspecified: Secondary | ICD-10-CM | POA: Diagnosis not present

## 2018-05-22 DIAGNOSIS — I10 Essential (primary) hypertension: Secondary | ICD-10-CM | POA: Diagnosis not present

## 2018-05-22 DIAGNOSIS — R600 Localized edema: Secondary | ICD-10-CM | POA: Diagnosis not present

## 2018-05-22 DIAGNOSIS — R21 Rash and other nonspecific skin eruption: Secondary | ICD-10-CM | POA: Diagnosis not present

## 2018-06-08 NOTE — Progress Notes (Signed)
Please send surgery orders. Pt is scheduled to come for his PAT appt tomorrow 06/09/18.

## 2018-06-08 NOTE — Patient Instructions (Addendum)
DEWITTE VANNICE  06/08/2018   Your procedure is scheduled on: 06-21-18    Report to [facility] Main  Entrance    Report to Admitting at 6:30 AM    Call this number if you have problems the morning of surgery [phone]    Remember: Do not eat food or drink liquids :After Midnight.    Take these medicines the morning of surgery with A SIP OF WATER: Amlodipine (Norvasc)   BRUSH YOUR TEETH MORNING OF SURGERY AND RINSE YOUR MOUTH OUT, NO CHEWING GUM CANDY OR MINTS.                               You may not have any metal on your body including hair pins and              piercings  Do not wear jewelry, cologne, lotions, powders or deodorant             Men may shave their face and neck               Do not bring valuables to the hospital. [place].  Contacts, dentures or bridgework may not be worn into surgery.  Leave suitcase in the car. After surgery it may be brought to your room.     Patients discharged the day of surgery will not be allowed to drive home. IF YOU ARE HAVING SURGERY AND GOING HOME THE SAME DAY, YOU MUST HAVE AN ADULT TO DRIVE YOU HOME AND BE WITH YOU FOR 24 HOURS. YOU MAY GO HOME BY TAXI OR UBER OR ORTHERWISE, BUT AN ADULT MUST ACCOMPANY YOU HOME AND STAY WITH YOU FOR 24 HOURS.    Special Instructions: N/A              Please read over the following fact sheets you were given: _____________________________________________________________________             [facility] - Preparing for Surgery Before surgery, you can play an important role.  Because skin is not sterile, your skin needs to be as free of germs as possible.  You can reduce the number of germs on your skin by washing with CHG (chlorahexidine gluconate) soap before surgery.  CHG is an antiseptic cleaner which kills germs and bonds with the skin to continue killing germs even after washing. Please DO NOT use if you have an  allergy to CHG or antibacterial soaps.  If your skin becomes reddened/irritated stop using the CHG and inform your nurse when you arrive at Short Stay. Do not shave (including legs and underarms) for at least 48 hours prior to the first CHG shower.  You may shave your face/neck. Please follow these instructions carefully:  1.  Shower with CHG Soap the night before surgery and the  morning of Surgery.  2.  If you choose to wash your hair, wash your hair first as usual with your  normal  shampoo.  3.  After you shampoo, rinse your hair and body thoroughly to remove the  shampoo.                           4.  Use CHG as you would  any other liquid soap.  You can apply chg directly  to the skin and wash                       Gently with a scrungie or clean washcloth.  5.  Apply the CHG Soap to your body ONLY FROM THE NECK DOWN.   Do not use on face/ open                           Wound or open sores. Avoid contact with eyes, ears mouth and genitals (private parts).                       Wash face,  Genitals (private parts) with your normal soap.             6.  Wash thoroughly, paying special attention to the area where your surgery  will be performed.  7.  Thoroughly rinse your body with warm water from the neck down.  8.  DO NOT shower/wash with your normal soap after using and rinsing off  the CHG Soap.                9.  Pat yourself dry with a clean towel.            10.  Wear clean pajamas.            11.  Place clean sheets on your bed the night of your first shower and do not  sleep with pets. Day of Surgery : Do not apply any lotions/deodorants the morning of surgery.  Please wear clean clothes to the hospital/surgery center.  FAILURE TO FOLLOW THESE INSTRUCTIONS MAY RESULT IN THE CANCELLATION OF YOUR SURGERY PATIENT SIGNATURE_________________________________  NURSE  SIGNATURE__________________________________  ________________________________________________________________________

## 2018-06-08 NOTE — Progress Notes (Signed)
05-08-18 (Epic) EKG

## 2018-06-09 ENCOUNTER — Encounter (HOSPITAL_COMMUNITY)
Admission: RE | Admit: 2018-06-09 | Discharge: 2018-06-09 | Disposition: A | Discharge status: Home / Self Care | Payer: 59 | Source: Ambulatory Visit | Attending: Orthopaedic Surgery | Admitting: Orthopaedic Surgery

## 2018-06-09 ENCOUNTER — Other Ambulatory Visit: Payer: Self-pay

## 2018-06-09 ENCOUNTER — Encounter (HOSPITAL_COMMUNITY): Payer: Self-pay

## 2018-06-09 DIAGNOSIS — Z01812 Encounter for preprocedural laboratory examination: Secondary | ICD-10-CM | POA: Insufficient documentation

## 2018-06-09 LAB — BASIC METABOLIC PANEL
Anion gap: 7 (ref 5–15)
BUN: 14 mg/dL (ref 8–23)
CO2: 24 mmol/L (ref 22–32)
Calcium: 9.5 mg/dL (ref 8.9–10.3)
Chloride: 104 mmol/L (ref 98–111)
Creatinine, Ser: 0.88 mg/dL (ref 0.61–1.24)
GFR calc Af Amer: >60 mL/min (ref >60)
GFR calc non Af Amer: >60 mL/min (ref >60)
Glucose, Bld: 101 mg/dL — ABNORMAL HIGH (ref 70–99)
Potassium: 4.1 mmol/L (ref 3.5–5.1)
Sodium: 135 mmol/L (ref 135–145)

## 2018-06-09 LAB — CBC
HCT: 44.7 % (ref 39.0–52.0)
Hemoglobin: 14.9 g/dL (ref 13.0–17.0)
MCH: 38.4 pg — ABNORMAL HIGH (ref 26.0–34.0)
MCHC: 33.3 g/dL (ref 30.0–36.0)
MCV: 115.2 fL — ABNORMAL HIGH (ref 80.0–100.0)
Platelets: 209 10*3/uL (ref 150–400)
RBC: 3.88 MIL/uL — ABNORMAL LOW (ref 4.22–5.81)
RDW: 12.1 % (ref 11.5–15.5)
WBC: 10.9 10*3/uL — ABNORMAL HIGH (ref 4.0–10.5)
nRBC: 0 % (ref 0.0–0.2)

## 2018-06-09 LAB — SURGICAL PCR SCREEN
MRSA, PCR: NEGATIVE
Staphylococcus aureus: NEGATIVE

## 2018-06-20 NOTE — Anesthesia Preprocedure Evaluation (Addendum)
Anesthesia Evaluation  Patient identified by MRN, date of birth, ID band  Reviewed: Allergy & Precautions, NPO status , Patient's Chart, lab work & pertinent test results  Airway Mallampati: II  TM Distance: >3 FB Neck ROM: Full    Dental no notable dental hx. (+) Dental Advisory Given, Poor Dentition   Pulmonary Current Smoker,    Pulmonary exam normal breath sounds clear to auscultation       Cardiovascular Exercise Tolerance: Good hypertension, Pt. on medications negative cardio ROS Normal cardiovascular exam Rhythm:Regular Rate:Normal  05/08/2018 EKG ST RBBB   Neuro/Psych negative neurological ROS  negative psych ROS   GI/Hepatic negative GI ROS, (+)     substance abuse  alcohol use,   Endo/Other  negative endocrine ROS  Renal/GU K+ 4.1 Cr 0.88      Musculoskeletal  (+) Arthritis ,   Abdominal   Peds  Hematology Hgb 14.9   Anesthesia Other Findings   Reproductive/Obstetrics                           Anesthesia Physical Anesthesia Plan  ASA: III  Anesthesia Plan: General   Post-op Pain Management:  Regional for Post-op pain   Induction: Intravenous  PONV Risk Score and Plan: 2 and Treatment may vary due to age or medical condition, Ondansetron and Dexamethasone  Airway Management Planned: Oral ETT  Additional Equipment:   Intra-op Plan:   Post-operative Plan: Extubation in OR  Informed Consent: I have reviewed the patients History and Physical, chart, labs and discussed the procedure including the risks, benefits and alternatives for the proposed anesthesia with the patient or authorized representative who has indicated his/her understanding and acceptance.     Dental advisory given  Plan Discussed with: CRNA  Anesthesia Plan Comments: (R Total Shoulder Arthrolasty w ISB w Exparel)      Anesthesia Quick Evaluation

## 2018-06-20 NOTE — Progress Notes (Signed)
Patient denies symptoms of COVID 19. He is instructed only one family member can come to hospital with him.

## 2018-06-21 ENCOUNTER — Ambulatory Visit (HOSPITAL_COMMUNITY): Payer: 59

## 2018-06-21 ENCOUNTER — Encounter (HOSPITAL_COMMUNITY): Payer: Self-pay | Admitting: Anesthesiology

## 2018-06-21 ENCOUNTER — Other Ambulatory Visit: Payer: Self-pay

## 2018-06-21 ENCOUNTER — Ambulatory Visit (HOSPITAL_COMMUNITY)
Admission: RE | Admit: 2018-06-21 | Discharge: 2018-06-22 | Disposition: A | Discharge status: Home / Self Care | Payer: 59 | Attending: Orthopaedic Surgery | Admitting: Orthopaedic Surgery

## 2018-06-21 ENCOUNTER — Ambulatory Visit (HOSPITAL_COMMUNITY): Payer: 59 | Admitting: Anesthesiology

## 2018-06-21 ENCOUNTER — Ambulatory Visit (HOSPITAL_COMMUNITY): Payer: 59 | Admitting: Physician Assistant

## 2018-06-21 ENCOUNTER — Encounter (HOSPITAL_COMMUNITY)
Admission: RE | Disposition: A | Discharge status: Home / Self Care | Payer: Self-pay | Source: Home / Self Care | Attending: Orthopaedic Surgery

## 2018-06-21 DIAGNOSIS — M12811 Other specific arthropathies, not elsewhere classified, right shoulder: Secondary | ICD-10-CM | POA: Diagnosis present

## 2018-06-21 DIAGNOSIS — I10 Essential (primary) hypertension: Secondary | ICD-10-CM | POA: Insufficient documentation

## 2018-06-21 DIAGNOSIS — Z09 Encounter for follow-up examination after completed treatment for conditions other than malignant neoplasm: Secondary | ICD-10-CM

## 2018-06-21 DIAGNOSIS — M75101 Unspecified rotator cuff tear or rupture of right shoulder, not specified as traumatic: Secondary | ICD-10-CM | POA: Diagnosis present

## 2018-06-21 DIAGNOSIS — Z96611 Presence of right artificial shoulder joint: Secondary | ICD-10-CM | POA: Diagnosis not present

## 2018-06-21 DIAGNOSIS — M19011 Primary osteoarthritis, right shoulder: Secondary | ICD-10-CM | POA: Insufficient documentation

## 2018-06-21 DIAGNOSIS — G8918 Other acute postprocedural pain: Secondary | ICD-10-CM | POA: Diagnosis not present

## 2018-06-21 DIAGNOSIS — Z79899 Other long term (current) drug therapy: Secondary | ICD-10-CM | POA: Insufficient documentation

## 2018-06-21 DIAGNOSIS — F17210 Nicotine dependence, cigarettes, uncomplicated: Secondary | ICD-10-CM | POA: Insufficient documentation

## 2018-06-21 HISTORY — PX: REVERSE SHOULDER ARTHROPLASTY: SHX5054

## 2018-06-21 SURGERY — ARTHROPLASTY, SHOULDER, TOTAL, REVERSE
Anesthesia: General | Site: Shoulder | Laterality: Right

## 2018-06-21 MED ORDER — HYDROMORPHONE HCL 1 MG/ML IJ SOLN
0.5000 mg | INTRAMUSCULAR | Status: DC | PRN
  Filled 2018-06-21: qty 1

## 2018-06-21 MED ORDER — ACETAMINOPHEN 500 MG PO TABS
1000.0000 mg | ORAL_TABLET | Freq: Once | ORAL | Status: AC
  Administered 2018-06-21: 1000 mg via ORAL
  Filled 2018-06-21: qty 2

## 2018-06-21 MED ORDER — VANCOMYCIN HCL POWD
Status: DC | PRN
  Administered 2018-06-21: 1000 mg via TOPICAL

## 2018-06-21 MED ORDER — LIDOCAINE 2% (20 MG/ML) 5 ML SYRINGE
INTRAMUSCULAR | Status: DC | PRN
  Administered 2018-06-21: 40 mg via INTRAVENOUS

## 2018-06-21 MED ORDER — ROCURONIUM BROMIDE 10 MG/ML (PF) SYRINGE
PREFILLED_SYRINGE | INTRAVENOUS | Status: AC
  Filled 2018-06-21: qty 20

## 2018-06-21 MED ORDER — KETOROLAC TROMETHAMINE 30 MG/ML IJ SOLN: 30.0000 mg | Freq: Once | INTRAMUSCULAR | Status: DC | PRN

## 2018-06-21 MED ORDER — TRANEXAMIC ACID-NACL 1000-0.7 MG/100ML-% IV SOLN
1000.0000 mg | INTRAVENOUS | Status: AC
  Administered 2018-06-21: 1000 mg via INTRAVENOUS
  Filled 2018-06-21: qty 100

## 2018-06-21 MED ORDER — PHENYLEPHRINE HCL-NACL 10-0.9 MG/250ML-% IV SOLN
INTRAVENOUS | Status: AC
  Filled 2018-06-21: qty 250

## 2018-06-21 MED ORDER — METOCLOPRAMIDE HCL 5 MG/ML IJ SOLN: 5.0000 mg | Freq: Three times a day (TID) | INTRAMUSCULAR | Status: DC | PRN

## 2018-06-21 MED ORDER — FENTANYL CITRATE (PF) 100 MCG/2ML IJ SOLN: 25.0000 ug | INTRAMUSCULAR | Status: DC | PRN

## 2018-06-21 MED ORDER — CEFAZOLIN SODIUM-DEXTROSE 2-4 GM/100ML-% IV SOLN
2.0000 g | INTRAVENOUS | Status: AC
  Administered 2018-06-21: 2 g via INTRAVENOUS
  Filled 2018-06-21: qty 100

## 2018-06-21 MED ORDER — EPHEDRINE 5 MG/ML INJ
INTRAVENOUS | Status: AC
  Filled 2018-06-21: qty 10

## 2018-06-21 MED ORDER — OXYCODONE HCL 5 MG PO TABS
5.0000 mg | ORAL_TABLET | ORAL | Status: DC | PRN
  Administered 2018-06-21 – 2018-06-22 (×5): 10 mg via ORAL
  Filled 2018-06-21 (×5): qty 2

## 2018-06-21 MED ORDER — TOBRAMYCIN SULFATE 1.2 G IJ SOLR
INTRAMUSCULAR | Status: AC
  Filled 2018-06-21: qty 1.2

## 2018-06-21 MED ORDER — DOCUSATE SODIUM 100 MG PO CAPS
100.0000 mg | ORAL_CAPSULE | Freq: Two times a day (BID) | ORAL | Status: DC
  Administered 2018-06-21 – 2018-06-22 (×3): 100 mg via ORAL
  Filled 2018-06-21 (×3): qty 1

## 2018-06-21 MED ORDER — OXYCODONE HCL 5 MG/5ML PO SOLN: 5.0000 mg | Freq: Once | ORAL | Status: DC | PRN

## 2018-06-21 MED ORDER — MEPERIDINE HCL 50 MG/ML IJ SOLN: 6.2500 mg | INTRAMUSCULAR | Status: DC | PRN

## 2018-06-21 MED ORDER — BUPIVACAINE HCL (PF) 0.5 % IJ SOLN
INTRAMUSCULAR | Status: DC | PRN
  Administered 2018-06-21: 75 mg via PERINEURAL

## 2018-06-21 MED ORDER — MIDAZOLAM HCL 5 MG/5ML IJ SOLN
INTRAMUSCULAR | Status: DC | PRN
  Administered 2018-06-21: 2 mg via INTRAVENOUS

## 2018-06-21 MED ORDER — SUGAMMADEX SODIUM 200 MG/2ML IV SOLN
INTRAVENOUS | Status: DC | PRN
  Administered 2018-06-21: 350 mg via INTRAVENOUS

## 2018-06-21 MED ORDER — AMLODIPINE BESYLATE 5 MG PO TABS: 5.0000 mg | ORAL_TABLET | Freq: Every day | ORAL | Status: DC

## 2018-06-21 MED ORDER — DIPHENHYDRAMINE HCL 50 MG/ML IJ SOLN
INTRAMUSCULAR | Status: AC
  Filled 2018-06-21: qty 1

## 2018-06-21 MED ORDER — PROPOFOL 10 MG/ML IV BOLUS
INTRAVENOUS | Status: DC | PRN
  Administered 2018-06-21: 150 mg via INTRAVENOUS

## 2018-06-21 MED ORDER — ONDANSETRON HCL 4 MG/2ML IJ SOLN
INTRAMUSCULAR | Status: DC | PRN
  Administered 2018-06-21 (×2): 4 mg via INTRAVENOUS

## 2018-06-21 MED ORDER — ONDANSETRON HCL 4 MG/2ML IJ SOLN
INTRAMUSCULAR | Status: AC
  Filled 2018-06-21: qty 4

## 2018-06-21 MED ORDER — TOBRAMYCIN SULFATE 1.2 G IJ SOLR
INTRAMUSCULAR | Status: DC | PRN
  Administered 2018-06-21: 1.2 g via TOPICAL

## 2018-06-21 MED ORDER — ONDANSETRON HCL 4 MG/2ML IJ SOLN: 4.0000 mg | Freq: Once | INTRAMUSCULAR | Status: DC | PRN

## 2018-06-21 MED ORDER — MIDAZOLAM HCL 2 MG/2ML IJ SOLN
INTRAMUSCULAR | Status: AC
  Filled 2018-06-21: qty 2

## 2018-06-21 MED ORDER — ONDANSETRON HCL 4 MG/2ML IJ SOLN: 4.0000 mg | Freq: Four times a day (QID) | INTRAMUSCULAR | Status: DC | PRN

## 2018-06-21 MED ORDER — ONDANSETRON HCL 4 MG PO TABS: 4.0000 mg | ORAL_TABLET | Freq: Four times a day (QID) | ORAL | Status: DC | PRN

## 2018-06-21 MED ORDER — GABAPENTIN 300 MG PO CAPS
300.0000 mg | ORAL_CAPSULE | Freq: Once | ORAL | Status: AC
  Administered 2018-06-21: 300 mg via ORAL
  Filled 2018-06-21: qty 1

## 2018-06-21 MED ORDER — OXYCODONE HCL 5 MG PO TABS: 5.0000 mg | ORAL_TABLET | Freq: Once | ORAL | Status: DC | PRN

## 2018-06-21 MED ORDER — SUGAMMADEX SODIUM 200 MG/2ML IV SOLN
INTRAVENOUS | Status: AC
  Filled 2018-06-21: qty 2

## 2018-06-21 MED ORDER — ROCURONIUM BROMIDE 10 MG/ML (PF) SYRINGE
PREFILLED_SYRINGE | INTRAVENOUS | Status: DC | PRN
  Administered 2018-06-21: 10 mg via INTRAVENOUS
  Administered 2018-06-21: 70 mg via INTRAVENOUS

## 2018-06-21 MED ORDER — CEFAZOLIN SODIUM-DEXTROSE 1-4 GM/50ML-% IV SOLN
1.0000 g | Freq: Four times a day (QID) | INTRAVENOUS | Status: AC
  Administered 2018-06-21 – 2018-06-22 (×3): 1 g via INTRAVENOUS
  Filled 2018-06-21 (×3): qty 50

## 2018-06-21 MED ORDER — VANCOMYCIN HCL 1000 MG IV SOLR
INTRAVENOUS | Status: AC
  Filled 2018-06-21: qty 1000

## 2018-06-21 MED ORDER — LIDOCAINE 2% (20 MG/ML) 5 ML SYRINGE
INTRAMUSCULAR | Status: AC
  Filled 2018-06-21: qty 5

## 2018-06-21 MED ORDER — IRBESARTAN 150 MG PO TABS
300.0000 mg | ORAL_TABLET | Freq: Every day | ORAL | Status: DC
  Filled 2018-06-21: qty 2

## 2018-06-21 MED ORDER — SODIUM CHLORIDE 0.9 % IR SOLN
Status: DC | PRN
  Administered 2018-06-21: 1000 mL

## 2018-06-21 MED ORDER — ZOLPIDEM TARTRATE 5 MG PO TABS: 5.0000 mg | ORAL_TABLET | Freq: Every evening | ORAL | Status: DC | PRN

## 2018-06-21 MED ORDER — PROPOFOL 10 MG/ML IV BOLUS
INTRAVENOUS | Status: AC
  Filled 2018-06-21: qty 20

## 2018-06-21 MED ORDER — SODIUM CHLORIDE 0.9 % IV SOLN
INTRAVENOUS | Status: DC | PRN
  Administered 2018-06-21: 25 ug/min via INTRAVENOUS

## 2018-06-21 MED ORDER — PROPOFOL 10 MG/ML IV BOLUS
INTRAVENOUS | Status: AC
  Filled 2018-06-21: qty 40

## 2018-06-21 MED ORDER — DEXAMETHASONE SODIUM PHOSPHATE 10 MG/ML IJ SOLN
INTRAMUSCULAR | Status: DC | PRN
  Administered 2018-06-21: 10 mg via INTRAVENOUS

## 2018-06-21 MED ORDER — LACTATED RINGERS IV SOLN
INTRAVENOUS | Status: DC
  Administered 2018-06-21: 07:00:00 via INTRAVENOUS

## 2018-06-21 MED ORDER — BUPIVACAINE LIPOSOME 1.3 % IJ SUSP
INTRAMUSCULAR | Status: DC | PRN
  Administered 2018-06-21: 133 mg

## 2018-06-21 MED ORDER — FENTANYL CITRATE (PF) 100 MCG/2ML IJ SOLN
INTRAMUSCULAR | Status: DC | PRN
  Administered 2018-06-21 (×2): 50 ug via INTRAVENOUS

## 2018-06-21 MED ORDER — DEXAMETHASONE SODIUM PHOSPHATE 10 MG/ML IJ SOLN
INTRAMUSCULAR | Status: AC
  Filled 2018-06-21: qty 2

## 2018-06-21 MED ORDER — EPHEDRINE SULFATE-NACL 50-0.9 MG/10ML-% IV SOSY
PREFILLED_SYRINGE | INTRAVENOUS | Status: DC | PRN
  Administered 2018-06-21 (×2): 5 mg via INTRAVENOUS

## 2018-06-21 MED ORDER — DIPHENHYDRAMINE HCL 12.5 MG/5ML PO ELIX: 12.5000 mg | ORAL_SOLUTION | ORAL | Status: DC | PRN

## 2018-06-21 MED ORDER — METOCLOPRAMIDE HCL 5 MG PO TABS: 5.0000 mg | ORAL_TABLET | Freq: Three times a day (TID) | ORAL | Status: DC | PRN

## 2018-06-21 MED ORDER — FENTANYL CITRATE (PF) 100 MCG/2ML IJ SOLN
INTRAMUSCULAR | Status: AC
  Filled 2018-06-21: qty 2

## 2018-06-21 MED ORDER — CHLORHEXIDINE GLUCONATE 4 % EX LIQD: 60.0000 mL | Freq: Once | CUTANEOUS | Status: DC

## 2018-06-21 MED ORDER — CELECOXIB 200 MG PO CAPS
200.0000 mg | ORAL_CAPSULE | Freq: Two times a day (BID) | ORAL | Status: DC
  Administered 2018-06-21 – 2018-06-22 (×3): 200 mg via ORAL
  Filled 2018-06-21 (×3): qty 1

## 2018-06-21 MED ORDER — BUPIVACAINE-EPINEPHRINE (PF) 0.25% -1:200000 IJ SOLN
INTRAMUSCULAR | Status: AC
  Filled 2018-06-21: qty 30

## 2018-06-21 MED ORDER — ACETAMINOPHEN 500 MG PO TABS
1000.0000 mg | ORAL_TABLET | Freq: Three times a day (TID) | ORAL | Status: DC
  Administered 2018-06-21 – 2018-06-22 (×3): 1000 mg via ORAL
  Filled 2018-06-21 (×3): qty 2

## 2018-06-21 MED FILL — Vancomycin HCl For IV Soln 1 GM (Base Equivalent): INTRAVENOUS | Qty: 1000 | Status: AC

## 2018-06-21 SURGICAL SUPPLY — 57 items
BASEPLATE REV SHOULDER 29 OD (Plate) ×2 IMPLANT
BIT DRILL 3.2 PERIPHERAL SCREW (BIT) ×2 IMPLANT
BLADE EXTENDED COATED 6.5IN (ELECTRODE) IMPLANT
BLADE SAW SAG 73X25 THK (BLADE) ×1
BLADE SAW SGTL 73X25 THK (BLADE) ×1 IMPLANT
CHLORAPREP W/TINT 26ML (MISCELLANEOUS) ×4 IMPLANT
CLSR STERI-STRIP ANTIMIC 1/2X4 (GAUZE/BANDAGES/DRESSINGS) ×2 IMPLANT
COVER SURGICAL LIGHT HANDLE (MISCELLANEOUS) ×2 IMPLANT
COVER WAND RF STERILE (DRAPES) ×2 IMPLANT
DRAPE INCISE IOBAN 66X45 STRL (DRAPES) ×2 IMPLANT
DRAPE ORTHO SPLIT 77X108 STRL (DRAPES) ×2
DRAPE SHEET LG 3/4 BI-LAMINATE (DRAPES) ×2 IMPLANT
DRAPE SURG ORHT 6 SPLT 77X108 (DRAPES) ×2 IMPLANT
DRSG AQUACEL AG ADV 3.5X 6 (GAUZE/BANDAGES/DRESSINGS) ×2 IMPLANT
ELECT BLADE TIP CTD 4 INCH (ELECTRODE) ×2 IMPLANT
ELECT REM PT RETURN 15FT ADLT (MISCELLANEOUS) ×2 IMPLANT
GLENOSPHERE REV SHOULDER 36 (Joint) ×2 IMPLANT
GLOVE BIO SURGEON STRL SZ8 (GLOVE) ×2 IMPLANT
GLOVE BIOGEL PI IND STRL 8 (GLOVE) ×2 IMPLANT
GLOVE BIOGEL PI INDICATOR 8 (GLOVE) ×2
GLOVE ECLIPSE 8.0 STRL XLNG CF (GLOVE) ×4 IMPLANT
GOWN SPEC L3 XXLG W/TWL (GOWN DISPOSABLE) ×2 IMPLANT
GOWN STRL REUS W/ TWL XL LVL3 (GOWN DISPOSABLE) ×1 IMPLANT
GOWN STRL REUS W/TWL XL LVL3 (GOWN DISPOSABLE) ×1
GUIDEWIRE GLENOID 2.5X220 (WIRE) ×2 IMPLANT
HANDPIECE INTERPULSE COAX TIP (DISPOSABLE) ×1
HEMOSTAT SURGICEL 2X14 (HEMOSTASIS) IMPLANT
IMPL REVERSE SHOULDER 0X3.5 (Shoulder) ×1 IMPLANT
IMPLANT REVERSE SHOULDER 0X3.5 (Shoulder) ×2 IMPLANT
INSERT HUM REVERSE SHOULDER 36 (Miscellaneous) ×2 IMPLANT
KIT BASIN OR (CUSTOM PROCEDURE TRAY) ×2 IMPLANT
KIT STABILIZATION SHOULDER (MISCELLANEOUS) ×2 IMPLANT
KIT TURNOVER KIT A (KITS) IMPLANT
MANIFOLD NEPTUNE II (INSTRUMENTS) ×2 IMPLANT
NEEDLE HYPO 25X1 1.5 SAFETY (NEEDLE) IMPLANT
NS IRRIG 1000ML POUR BTL (IV SOLUTION) ×2 IMPLANT
PACK SHOULDER (CUSTOM PROCEDURE TRAY) ×2 IMPLANT
RESTRAINT HEAD UNIVERSAL NS (MISCELLANEOUS) ×2 IMPLANT
SCREW 5.0X38 SMALL F/PERFORM (Screw) ×2 IMPLANT
SCREW BONE 6.5X40 SM (Screw) ×2 IMPLANT
SCREW PERIPHERAL 30 (Screw) ×2 IMPLANT
SET HNDPC FAN SPRY TIP SCT (DISPOSABLE) ×1 IMPLANT
SLING ULTRA III MED (ORTHOPEDIC SUPPLIES) ×2 IMPLANT
SPONGE LAP 18X18 X RAY DECT (DISPOSABLE) IMPLANT
STEM HUMERAL PTC STND SZ2A (Joint) ×2 IMPLANT
STEM HUMERAL PTCSTD SZ2A (Joint) ×1 IMPLANT
SUCTION FRAZIER HANDLE 10FR (MISCELLANEOUS) ×1
SUCTION TUBE FRAZIER 10FR DISP (MISCELLANEOUS) ×1 IMPLANT
SUT ETHIBOND 2 V 37 (SUTURE) ×2 IMPLANT
SUT ETHIBOND NAB CT1 #1 30IN (SUTURE) ×2 IMPLANT
SUT FIBERWIRE #5 38 CONV NDL (SUTURE) ×8
SUT MNCRL AB 3-0 PS2 18 (SUTURE) ×2 IMPLANT
SUT VIC AB 2-0 CT1 27 (SUTURE) ×1
SUT VIC AB 2-0 CT1 TAPERPNT 27 (SUTURE) ×1 IMPLANT
SUTURE FIBERWR #5 38 CONV NDL (SUTURE) ×4 IMPLANT
TOWEL OR 17X26 10 PK STRL BLUE (TOWEL DISPOSABLE) ×2 IMPLANT
WATER STERILE IRR 1000ML POUR (IV SOLUTION) ×2 IMPLANT

## 2018-06-21 NOTE — Op Note (Signed)
Orthopaedic Surgery Operative Note (CSN: [id number])  Fernando [name]  1956/03/15 Date of Surgery: 06/21/2018   Diagnoses:  Right rotator cuff arthropathy with questionable history of previous infection  Procedure: Right reverse total shoulder arthroplasty with multiple synovial biopsies   Operative Finding Successful completion of planned procedure.  135 reverse total shoulder arthroplasty placed.  Were happy with the construct itself.  5 samples were sent for specimen.  Implants: 2 a stem, 0 high offset tray, 9 poly-, 36 glenosphere and 29 baseplate with 40 center screw.  Tornier ascend components.  Post-operative plan: The patient will be nonweightbearing in a sling.  The patient will be admitted to the floor overnight.  DVT prophylaxis not indicated in amatory upper extremity patient with low risk.  Pain control with PRN pain medication preferring oral medicines.  Follow up plan will be scheduled in approximately 7 days for incision check and XR.  Post-Op Diagnosis: Same Surgeons:Primary: Hiram Gash, MD Assistants: Joya Gaskins, OPAC Location: [place] [facility] Anesthesia: General Antibiotics: Ancef 2g preop, Vancomycin 1000mg  locally tobramycin 1.2 g Tourniquet time: * No tourniquets in log * Estimated Blood Loss: 732 Complications: Non Specimens: 5 individual specimen sent, right shoulder #1 through 5 Implants: Implant Name Type Inv. Item Serial No. Manufacturer Lot No. LRB No. Used Action  AEQUALIS PERFORM REVERSED   [id number] [employer]  Right 1 Implanted  GLENOSPHERE REV SHOULDER 36 - [id number] Joint GLENOSPHERE REV SHOULDER 36 [id number] [employer]  Right 1 Implanted  AEQUALIS ASCEND FLEX   [id number] [employer]  Right 1 Implanted  IMPLANT REVERSE SHOULDER 0X3.5 - [id number] Shoulder IMPLANT REVERSE SHOULDER 0X3.5 [id number] [employer]  Right 1 Implanted  FLEX SHOULDER SYSTEM   [id number] [employer]  Right 1 Implanted  SCREW 6.5X40    [employer]  Right 1 Implanted   SCREW 5.0X38    [employer]  Right 1 Implanted  SCREW PERIPHERAL 30 - [id number] Screw SCREW PERIPHERAL 30  [employer]  Right 1 Implanted    Indications for Surgery:   Fernando [name] is a 63 y.o. male with previous history of open rotator cuff repair with questionable wound dehiscence and chronic lower extremity drainage.  Patient had extensive work-up with negative aspiration for 2 weeks no clinical sign of infection in the shoulder and a long course of antibiotics to treat his lower extremity infection.  He was deemed to be at his baseline with minimize risks.  Benefits and risks of operative and nonoperative management were discussed prior to surgery with patient/guardian(s) and informed consent form was completed.  Specific risks including infection, need for additional surgery, infection, component longevity issues, dislocation, fracture and periprosthetic fracture.   Procedure:   The patient was identified in the preoperative holding area where the surgical site was marked. The patient was taken to the OR where a procedural timeout was called and the above noted anesthesia was induced.  The patient was positioned beachchair on [employer] table.  Preoperative antibiotics were dosed.  The patient's right shoulder was prepped and draped in the usual sterile fashion.  A second preoperative timeout was called.      Standard deltopectoral approach was performed with a #10 blade. We dissected down to the subcutaneous tissues and the cephalic vein was taken laterally with the deltoid. Clavipectoral fascia was incised in line with the incision. Deep retractors were placed. The long of the biceps tendon was identified and there was significant tenosynovitis present.  Tenodesis was performed to the pectoralis tendon with #  2 Ethibond. The remaining biceps was followed up into the rotator interval where it was released.   The subscapularis was taken down in a full thickness layer with capsule along the humeral  neck extending inferiorly around the humeral head. We continued releasing the capsule directly off of the osteophytes inferiorly all the way around the corner. This allowed [place] to dislocate the humeral head.   The humeral head had evidence of severe osteoarthritic wear with full-thickness cartilage loss and exposed subchondral bone. There was significant flattening of the humeral head.   The rotator cuff was carefully examined and noted to be irreperably torn.  The decision was confirmed that a reverse total shoulder was indicated for this patient.  There were osteophytes along the inferior humeral neck. The osteophytes were removed with an osteotome and a rongeur.  Osteophytes were removed with a rongeur and an osteotome and the anatomic neck was well visualized.     A humeral cutting guide was inserted down the intramedullary canal. The version was set at 20 of retroversion. Humeral osteotomy was performed with an oscillating saw. The head fragment was passed off the back table. A starter awl was used to open the humeral canal. We next used T-handle straight sound reamers to ream up to an appropriate fit. A chisel was used to remove proximal humeral bone. We then broached starting with a size one broach and broaching up to 2 which obtained an appropriate fit. The broach handle was removed. A cut protector was placed. The broach handle was removed and a cut protector was placed. The humerus was retracted posteriorly and we turned our attention to glenoid exposure.  The subscapularis was again identified and immediately we took care to palpate the axillary nerve anteriorly and verify its position with gentle palpation as well as the tug test.  We then released the SGHL with bovie cautery prior to placing a curved mayo at the junction of the anterior glenoid well above the axillary nerve and bluntly dissecting the subscapularis from the capsule.  We then carefully protected the axillary nerve as we gently  released the inferior capsule to fully mobilize the subscapularis.  An anterior deltoid retractor was then placed as well as a small Hohmann retractor superiorly.   The glenoid was inspected and had evidence of severe osteoarthritic wear with full-thickness cartilage loss and exposed subchondral bone. The remaining labrum was removed circumferentially taking great care not to disrupt the posterior capsule.   The glenoid drill guide was placed and used to drill a guide pin in the center, inferior position. The glenoid face was then reamed concentrically over the guide wire. The center hole was drilled over the guidepin in a near anatomic angle of version. Next the  glenoid vault was drilled back to a depth of 40 mm.  We tapped and then placed a 32mm size baseplate with 56mm lateralization was selected with a 40 mm x 6.5 mm length central screw.  The base plate was screwed into the glenoid vault obtaining secure fixation. We next placed superior and inferior locking screws for additional fixation.  Next a 36 mm glenosphere was selected and impacted onto the baseplate. The center screw was tightened.  We turned attention back to the humeral side. The cut protector was removed. We trialed with multiple size tray and polyethylene options and selected a 9 which provided good stability and range of motion without excess soft tissue tension. The offset was dialed in to match the normal anatomy. The shoulder  was trialed.  There was good ROM in all planes and the shoulder was stable with no inferior translation.  The real humeral implants were opened after again confirming sizes.  The trial was removed. #5 Ethibond sutures passed through the humeral neck for subscap repair. The humeral component was press-fit obtaining a secure fit. A +0 high offset tray was selected and impacted onto the stem.  A 36+9 polyethylene liner was impacted onto the stem.  The joint was reduced and thoroughly irrigated with pulsatile lavage.  Subscap was repaired back with #5 Fiberwire sutures through bone tunnels.  Gweneth Dimitri itself was relatively diminutive but we felt that this layer of capsular tissue would be helpful to prevent instability.  Hemostasis was obtained. The deltopectoral interval was reapproximated with #1 Ethibond. The subcutaneous tissues were closed with 2-0 Vicryl and the skin was closed with running monocryl.    The wounds were cleaned and dried and an Aquacel dressing was placed. The drapes taken down. The arm was placed into sling with abduction pillow. Patient was awakened, extubated, and transferred to the recovery room in stable condition. There were no intraoperative complications. The sponge, needle, and attention counts were  correct at the end of the case.   Joya Gaskins, OPA-C, present and scrubbed throughout the case, critical for completion in a timely fashion, and for retraction, instrumentation, closure.

## 2018-06-21 NOTE — H&P (Signed)
PREOPERATIVE H&P  Chief Complaint: DEGENERATIVE JOINT DISEASE RIGHT SHOULDER  HPI: Fernando [name] is a 63 y.o. male who presents for preoperative history and physical with a diagnosis of DEGENERATIVE JOINT DISEASE RIGHT SHOULDER. Symptoms are rated as moderate to severe, and have been worsening.  This is significantly impairing activities of daily living.  Please see my clinic note for full details on this patient's care.  He has elected for surgical management.   Past Medical History:  Diagnosis Date  . Arthritis   . Cataract   . History of kidney stones   . Hyperlipidemia   . Hypertension   . Pneumonia    Past Surgical History:  Procedure Laterality Date  . COLONOSCOPY    . HERNIA REPAIR  1990  . KNEE SURGERY  1975   left men. repair  . ROTATOR CUFF REPAIR     right  . TONSILLECTOMY    . UMBILICAL HERNIA REPAIR     Social History   Socioeconomic History  . Marital status: Married    Spouse name: Not on file  . Number of children: Not on file  . Years of education: Not on file  . Highest education level: Not on file  Occupational History  . Not on file  Social Needs  . Financial resource strain: Not on file  . Food insecurity:    Worry: Not on file    Inability: Not on file  . Transportation needs:    Medical: Not on file    Non-medical: Not on file  Tobacco Use  . Smoking status: Current Every Day Smoker    Packs/day: 0.50    Types: Cigarettes  . Smokeless tobacco: Never Used  Substance and Sexual Activity  . Alcohol use: Yes    Alcohol/week: 13.0 - 20.0 standard drinks    Types: 6 Cans of beer, 7 - 14 Standard drinks or equivalent per week  . Drug use: No  . Sexual activity: Not on file  Lifestyle  . Physical activity:    Days per week: Not on file    Minutes per session: Not on file  . Stress: Not on file  Relationships  . Social connections:    Talks on phone: Not on file    Gets together: Not on file    Attends religious service: Not on file     Active member of club or organization: Not on file    Attends meetings of clubs or organizations: Not on file    Relationship status: Not on file  Other Topics Concern  . Not on file  Social History Narrative  . Not on file   Family History  Problem Relation Age of Onset  . Prostate cancer Father   . Colon cancer Neg Hx   . Esophageal cancer Neg Hx   . Stomach cancer Neg Hx   . Liver cancer Neg Hx   . Pancreatic cancer Neg Hx   . Rectal cancer Neg Hx    No Known Allergies Prior to Admission medications   Medication Sig Start Date End Date Taking? Authorizing Provider  ALPRAZolam Duanne Moron) 0.5 MG tablet Take 0.5 mg by mouth daily as needed for anxiety.  12/02/17  Yes [provider]  amLODipine (NORVASC) 5 MG tablet Take 5 mg by mouth daily.   Yes [provider]  HYDROcodone-acetaminophen (NORCO/VICODIN) 5-325 MG tablet Take 1 tablet by mouth every 6 (six) hours as needed for severe pain.   Yes [provider]  Multiple Vitamin (  MULTIVITAMIN) tablet Take 1 tablet by mouth once a week.   Yes [provider]  telmisartan (MICARDIS) 80 MG tablet Take 80 mg by mouth daily.    Yes [provider]     Positive ROS: All other systems have been reviewed and were otherwise negative with the exception of those mentioned in the HPI and as above.  Physical Exam: General: Alert, no acute distress Cardiovascular: No pedal edema Respiratory: No cyanosis, no use of accessory musculature GI: No organomegaly, abdomen is soft and non-tender Skin: No lesions in the area of chief complaint Neurologic: Sensation intact distally Psychiatric: Patient is competent for consent with normal mood and affect Lymphatic: No axillary or cervical lymphadenopathy  MUSCULOSKELETAL: R shoulder: pseudoparalytic, well healed incision anteriorly.    Assessment: DEGENERATIVE JOINT DISEASE RIGHT SHOULDER  Plan: Plan for Procedure(s): REVERSE SHOULDER  ARTHROPLASTY  The risks benefits and alternatives were discussed with the patient including but not limited to the risks of nonoperative treatment, versus surgical intervention including infection, bleeding, nerve injury,  blood clots, cardiopulmonary complications, morbidity, mortality, among others, and they were willing to proceed.   Hiram Gash, MD  06/21/2018 8:22 AM

## 2018-06-21 NOTE — Anesthesia Procedure Notes (Signed)
Procedure Name: Intubation Date/Time: 06/21/2018 8:41 AM Performed by: Lavina Hamman, CRNA Pre-anesthesia Checklist: Patient identified, Emergency Drugs available, Suction available, Patient being monitored and Timeout performed Patient Re-evaluated:Patient Re-evaluated prior to induction Oxygen Delivery Method: Circle system utilized Preoxygenation: Pre-oxygenation with 100% oxygen Induction Type: IV induction, Rapid sequence and Cricoid Pressure applied Laryngoscope Size: Mac and 4 Grade View: Grade I Tube type: Oral Tube size: 7.0 mm Number of attempts: 1 Airway Equipment and Method: Stylet Placement Confirmation: ETT inserted through vocal cords under direct vision,  positive ETCO2,  CO2 detector and breath sounds checked- equal and bilateral Secured at: 22 cm Tube secured with: Tape Dental Injury: Teeth and Oropharynx as per pre-operative assessment  Comments: ATOI.  Rapid sequence induction due to risk of Covid19.  Patient is a smoker with baseline cough, otherwise asymptomatic.   No mask ventilation.

## 2018-06-21 NOTE — Anesthesia Postprocedure Evaluation (Signed)
Anesthesia Post Note  Patient: Fernando [name]  Procedure(s) Performed: REVERSE SHOULDER ARTHROPLASTY (Right Shoulder)     Patient location during evaluation: PACU Anesthesia Type: General and Regional Level of consciousness: awake and alert Pain management: pain level controlled Vital Signs Assessment: post-procedure vital signs reviewed and stable Respiratory status: spontaneous breathing, nonlabored ventilation, respiratory function stable and patient connected to nasal cannula oxygen Cardiovascular status: blood pressure returned to baseline and stable Postop Assessment: no apparent nausea or vomiting Anesthetic complications: no    Last Vitals:  Vitals:   06/21/18 1115 06/21/18 1130  BP: 95/62 (!) 84/58  Pulse: (!) 103 95  Resp: (!) 23 16  Temp:    SpO2: 91% 92%    Last Pain:  Vitals:   06/21/18 1130  TempSrc:   PainSc: 0-No pain                 Barnet Glasgow

## 2018-06-21 NOTE — Anesthesia Procedure Notes (Signed)
Anesthesia Regional Block: Interscalene brachial plexus block   Pre-Anesthetic Checklist: ,, timeout performed, Correct Patient, Correct Site, Correct Laterality, Correct Procedure, Correct Position, site marked, Risks and benefits discussed,  Surgical consent,  Pre-op evaluation,  At surgeon's request and post-op pain management  Laterality: Upper and Right  Prep: Maximum Sterile Barrier Precautions used, chloraprep       Needles:  Injection technique: Single-shot  Needle Type: Echogenic Needle     Needle Length: 5cm  Needle Gauge: 21     Additional Needles:   Procedures:,,,, ultrasound used (permanent image in chart),,,,  Narrative:  Start time: 06/21/2018 7:55 AM End time: 06/21/2018 8:03 AM Injection made incrementally with aspirations every 5 mL.  Performed by: Personally  Anesthesiologist: Barnet Glasgow, MD  Additional Notes: Block assessed prior to procedure. Patient tolerated procedure well.

## 2018-06-21 NOTE — Progress Notes (Signed)
Called Dr. Griffin Basil and mentioned that the patient's BP was on the lower side baseline for PACU.  Patient stated that he has been taking for Norvasc for years and now he is no longer drinking alcohol.

## 2018-06-21 NOTE — Plan of Care (Signed)
  Problem: Education: Goal: Knowledge of the prescribed therapeutic regimen will improve Outcome: Progressing Goal: Understanding of activity limitations/precautions following surgery will improve Outcome: Progressing Goal: Individualized Educational Video(s) Outcome: Progressing   Problem: Activity: Goal: Ability to tolerate increased activity will improve Outcome: Progressing   Problem: Pain Management: Goal: Pain level will decrease with appropriate interventions Outcome: Progressing   Problem: Education: Goal: Knowledge of General Education information will improve Description Including pain rating scale, medication(s)/side effects and non-pharmacologic comfort measures Outcome: Progressing   Problem: Nutrition: Goal: Adequate nutrition will be maintained Outcome: Progressing   Problem: Pain Managment: Goal: General experience of comfort will improve Outcome: Progressing   Problem: Safety: Goal: Ability to remain free from injury will improve Outcome: Progressing    Will continue to educate patient

## 2018-06-21 NOTE — Progress Notes (Signed)
   06/21/18 1415  MEWS Assessment  Is this an acute change? No    Vital Signs MEWS/VS Documentation       06/21/2018 1245 06/21/2018 1246 06/21/2018 1345 06/21/2018 1349   MEWS Score:  1  2  2  2    MEWS Score Color:  Green  Yellow  Yellow  Yellow   Resp:  --  14  --  14   Pulse:  --  (!) 105  --  (!) 108   BP:  --  90/67  --  98/71   Temp:  --  98.3 F (36.8 C)  --  98.1 F (36.7 C)   O2 Device:  --  Nasal Cannula  --  Nasal Cannula   O2 Flow Rate (L/min):  --  2 L/min  --  2 L/min   Level of Consciousness:  Alert  Alert  Alert  --      Will continue to monitor       Darnelle Bos 06/21/2018,2:16 PM

## 2018-06-21 NOTE — Transfer of Care (Signed)
Immediate Anesthesia Transfer of Care Note  Patient: Fernando [name]  Procedure(s) Performed: Procedure(s): REVERSE SHOULDER ARTHROPLASTY (Right)  Patient Location: PACU  Anesthesia Type:GA combined with regional for post-op pain  Level of Consciousness:  sedated, patient cooperative and responds to stimulation  Airway & Oxygen Therapy:Patient Spontanous Breathing and Patient connected to face mask oxgen  Post-op Assessment:  Report given to PACU RN and Post -op Vital signs reviewed and stable  Post vital signs:  Reviewed and stable  Last Vitals:  Vitals:   06/21/18 0649  BP: 115/72  Pulse: (!) 108  Temp: 36.7 C  SpO2: 57%    Complications: No apparent anesthesia complications

## 2018-06-22 DIAGNOSIS — M19011 Primary osteoarthritis, right shoulder: Secondary | ICD-10-CM | POA: Diagnosis not present

## 2018-06-22 MED ORDER — CELECOXIB 100 MG PO CAPS: 100.0000 mg | ORAL_CAPSULE | Freq: Every day | ORAL | 2 refills | Status: AC

## 2018-06-22 MED ORDER — OXYCODONE HCL 5 MG PO TABS: ORAL_TABLET | ORAL | 0 refills | Status: AC

## 2018-06-22 MED ORDER — ONDANSETRON HCL 4 MG PO TABS: 4.0000 mg | ORAL_TABLET | Freq: Three times a day (TID) | ORAL | 1 refills | Status: AC | PRN

## 2018-06-22 MED ORDER — ACETAMINOPHEN 500 MG PO TABS: 1000.0000 mg | ORAL_TABLET | Freq: Three times a day (TID) | ORAL | 0 refills | Status: AC

## 2018-06-22 NOTE — Plan of Care (Signed)
  Problem: Education: Goal: Knowledge of the prescribed therapeutic regimen will improve Outcome: Progressing Goal: Understanding of activity limitations/precautions following surgery will improve Outcome: Progressing   Problem: Activity: Goal: Ability to tolerate increased activity will improve Outcome: Progressing   Problem: Pain Management: Goal: Pain level will decrease with appropriate interventions Outcome: Progressing   Problem: Education: Goal: Knowledge of General Education information will improve Description Including pain rating scale, medication(s)/side effects and non-pharmacologic comfort measures Outcome: Progressing   Problem: Health Behavior/Discharge Planning: Goal: Ability to manage health-related needs will improve Outcome: Progressing   Problem: Clinical Measurements: Goal: Ability to maintain clinical measurements within normal limits will improve Outcome: Progressing Goal: Will remain free from infection Outcome: Progressing   Problem: Nutrition: Goal: Adequate nutrition will be maintained Outcome: Progressing   Problem: Coping: Goal: Level of anxiety will decrease Outcome: Progressing   Problem: Pain Managment: Goal: General experience of comfort will improve Outcome: Progressing  RN will continue to educate patient

## 2018-06-22 NOTE — Evaluation (Signed)
Occupational Therapy Evaluation Patient Details Name: Fernando [name] MRN: [id number] DOB: 27-Feb-1956 Today's Date: 06/22/2018    History of Present Illness s/p R RTSA, h/o RCR, HTN, arthritis   Clinical Impression   This 63 year old man was admitted for the above sx.  All education was reviewed and handout given. Pt verbalizes this is what he did when he had RCR.  Pt did need cues not to assist when donning shirt and sling.  Son present and donned sling with cues    Follow Up Recommendations  Follow surgeon's recommendation for DC plan and follow-up therapies    Equipment Recommendations  None recommended by OT    Recommendations for Other Services       Precautions / Restrictions Precautions Precautions: Shoulder Type of Shoulder Precautions: abductor sling on at all times except for during bathing/dressing and elbow ROM. Elbow to finger ROM allowed; no shoulder movement Restrictions Weight Bearing Restrictions: Yes RUE Weight Bearing: Non weight bearing      Mobility Bed Mobility               General bed mobility comments: oob; plans to sleep in recliner  Transfers Overall transfer level: Independent                    Balance                                           ADL either performed or assessed with clinical judgement   ADL Overall ADL's : Needs assistance/impaired Eating/Feeding: Independent   Grooming: Independent   Upper Body Bathing: Moderate assistance   Lower Body Bathing: Moderate assistance   Upper Body Dressing : Maximal assistance   Lower Body Dressing: Maximal assistance   Toilet Transfer: Independent(chair)             General ADL Comments: reviewed protocol. Pt states this is what he did with RCR 11 years ago.  He did need cues not to move RUE and assist.  Son present and donned sling with cues.  Pt will sleep in a recliner     Vision         Perception     Praxis      Pertinent  Vitals/Pain Pain Assessment: Faces Faces Pain Scale: Hurts little more Pain Location: R shoulder Pain Intervention(s): Limited activity within patient's tolerance;Monitored during session;Premedicated before session;Repositioned     Hand Dominance Right   Extremity/Trunk Assessment Upper Extremity Assessment Upper Extremity Assessment: RUE deficits/detail(immobilized. Able to move finger, wrist, elbow)           Communication Communication Communication: No difficulties   Cognition Arousal/Alertness: Awake/alert Behavior During Therapy: WFL for tasks assessed/performed Overall Cognitive Status: Within Functional Limits for tasks assessed                                     General Comments       Exercises     Shoulder Instructions      Home Living Family/patient expects to be discharged to:: Private residence Living Arrangements: Alone                 Bathroom Shower/Tub: Occupational psychologist: Standard         Additional Comments: son will assist; present for  education      Prior Functioning/Environment Level of Independence: Independent                 OT Problem List:        OT Treatment/Interventions:      OT Goals(Current goals can be found in the care plan section) Acute Rehab OT Goals Patient Stated Goal: none stated OT Goal Formulation: All assessment and education complete, DC therapy  OT Frequency:     Barriers to D/C:            Co-evaluation              AM-PAC OT "6 Clicks" Daily Activity     Outcome Measure Help from another person eating meals?: None Help from another person taking care of personal grooming?: None Help from another person toileting, which includes using toliet, bedpan, or urinal?: A Little Help from another person bathing (including washing, rinsing, drying)?: A Lot Help from another person to put on and taking off regular upper body clothing?: A Lot Help from another  person to put on and taking off regular lower body clothing?: A Lot 6 Click Score: 17   End of Session Nurse Communication: (finished with OT)  Activity Tolerance: Patient tolerated treatment well Patient left: with call bell/phone within reach;in bed;with nursing/sitter in room(eob)  OT Visit Diagnosis: Pain Pain - Right/Left: Right Pain - part of body: Shoulder                Time: 6837-2902 OT Time Calculation (min): 13 min Charges:  OT General Charges $OT Visit: 1 Visit OT Evaluation $OT Eval Low Complexity: 1 Low  Lesle Chris, OTR/L Acute Rehabilitation Services [phone] [facility] pager [phone] office 06/22/2018  [place] 06/22/2018, 10:15 AM

## 2018-06-22 NOTE — Discharge Summary (Signed)
Patient ID: ARATH KAIGLER MRN: [id number] DOB/AGE: 1955/06/30 63 y.o.  Admit date: 06/21/2018 Discharge date: 06/22/2018  Admission Diagnoses:R cuff tear arthropathy  Discharge Diagnoses:  Active Problems:   Rotator cuff tear arthropathy, right   Past Medical History:  Diagnosis Date  . Arthritis   . Cataract   . History of kidney stones   . Hyperlipidemia   . Hypertension   . Pneumonia      Procedures Performed: Right reverse total shoulder  Discharged Condition: good  Hospital Course: Patient brought in as an outpatient for surgery.  Tolerated procedure well.  Was kept for monitoring overnight for pain control and medical monitoring postop and was found to be stable for DC home the morning after surgery.  Patient was instructed on specific activity restrictions and all questions were answered.   Consults: None  Significant Diagnostic Studies: No additional pertinent studies  Treatments: Surgery  Discharge Exam:  Dressing CDI and sling well fitting,  full and painless ROM throughout hand with DPC of 0.  Axillary nerve sensation/motor altered in setting of block and unable to be fully tested.  Distal motor and sensory altered in setting of block.   Disposition: Discharge disposition: 01-Home or Self Care       Discharge Instructions    Call MD for:  persistant nausea and vomiting   Complete by:  As directed    Call MD for:  redness, tenderness, or signs of infection (pain, swelling, redness, odor or green/yellow discharge around incision site)   Complete by:  As directed    Call MD for:  severe uncontrolled pain   Complete by:  As directed    Diet - low sodium heart healthy   Complete by:  As directed    Discharge instructions   Complete by:  As directed    Ophelia Charter MD, MPH [place]. [address], [address] [phone] (tel)   [phone] (fax)   [place]wey may leave the operative dressing in place until your follow-up appointment. KEEP THE INCISIONS CLEAN AND DRY. Use the Cryocuff, GameReady or Ice as often as possible for the first 3-4 days, then as needed for pain relief.  You may shower on Post-Op Day #2. The dressing is water resistant but do not scrub it as it may start to peel up.  You may remove the sling for showering, but keep a water resistant pillow under the arm to keep both the elbow and shoulder away from the body (mimicking the abduction sling). Gently pat the area dry. Do not soak the shoulder in water. Do not go swimming in the pool or ocean until your sutures are removed.  EXERCISES Wear the sling at all times except when doing your exercises. You may remove the sling for showering, but keep the arm across the chest or in a secondary sling.   Accidental/Purposeful External Rotation and shoulder flexion (reaching behind you) is to be avoided at all costs for the first month. Please perform the exercises:   Elbow / Hand / Wrist  Range of Motion Exercises POST-OP A multi-modal approach will be used to treat your pain. Oxycodone - This is a strong narcotic, to be used only on an "as needed" basis for pain. Celebrex- An anti-inflammatory medication Acetaminophen - A non-narcotic pain medicine.  Use 1000mg  three times a day for the first 14 days after surgery If you have any adverse effects with  the medications, please call our office.  FOLLOW-UP If you develop a Fever (>101.5), Redness or Drainage from the surgical incision site, please call our office to arrange for an evaluation. Please call the office to schedule a follow-up appointment for a wound check, 7-10 days post-operatively.    IF YOU HAVE ANY QUESTIONS, PLEASE FEEL FREE TO CALL OUR OFFICE.   HELPFUL INFORMATION  Your arm will be in a sling following surgery. You will be in this sling for the next 3-4 weeks.  I will let you know the exact duration at your  follow-up visit.  You may be more comfortable sleeping in a semi-seated position the first few nights following surgery.  Keep a pillow propped under the elbow and forearm for comfort.  If you have a recliner type of chair it might be beneficial.  If not that is fine too, but it would be helpful to sleep propped up with pillows behind your operated shoulder as well under your elbow and forearm.  This will reduce pulling on the suture lines.  We suggest you use the pain medication the first night prior to going to bed, in order to ease any pain when the anesthesia wears off. You should avoid taking pain medications on an empty stomach as it will make you nauseous.  Do not drink alcoholic beverages or take illicit drugs when taking pain medications.  In most states it is against the law to drive while your arm is in a sling. And certainly against the law to drive while taking narcotics.  You may return to work/school in the next couple of days when you feel up to it. Desk work and typing in the sling is fine.  When dressing, put your operative arm in the sleeve first.  When getting undressed, take your operative arm out last.  Loose fitting, button-down shirts are recommended.  Pain medication may make you constipated.  Below are a few solutions to try in this order: Decrease the amount of pain medication if you aren't having pain. Drink lots of decaffeinated fluids. Drink prune juice and/or each dried prunes  If the first 3 don't work start with additional solutions Take Colace - an over-the-counter stool softener Take Senokot - an over-the-counter laxative Take Miralax - a stronger over-the-counter laxative   Increase activity slowly   Complete by:  As directed      Allergies as of 06/22/2018   No Known Allergies     Medication List    STOP taking these medications   HYDROcodone-acetaminophen 5-325 MG tablet Commonly known as:  NORCO/VICODIN     TAKE these medications    acetaminophen 500 MG tablet Commonly known as:  TYLENOL Take 2 tablets (1,000 mg total) by mouth every 8 (eight) hours for 14 days.   ALPRAZolam 0.5 MG tablet Commonly known as:  XANAX Take 0.5 mg by mouth daily as needed for anxiety.   amLODipine 5 MG tablet Commonly known as:  NORVASC Take 5 mg by mouth daily.   celecoxib 100 MG capsule Commonly known as:  CeleBREX Take 1 capsule (100 mg total) by mouth daily.   multivitamin tablet Take 1 tablet by mouth once a week.   ondansetron 4 MG tablet Commonly known as:  Zofran Take 1 tablet (4 mg total) by mouth every 8 (eight) hours as needed for up to 7 days for nausea or vomiting.   oxyCODONE 5 MG immediate release tablet Commonly known as:  Oxy IR/ROXICODONE Take 1-2 pills every 6  hrs as needed for pain, no more than 6 per day   telmisartan 80 MG tablet Commonly known as:  MICARDIS Take 80 mg by mouth daily.

## 2018-06-26 ENCOUNTER — Encounter (HOSPITAL_COMMUNITY): Payer: Self-pay | Admitting: Orthopaedic Surgery

## 2018-06-28 LAB — AEROBIC/ANAEROBIC CULTURE W GRAM STAIN (SURGICAL/DEEP WOUND)

## 2018-06-30 DIAGNOSIS — M19011 Primary osteoarthritis, right shoulder: Secondary | ICD-10-CM | POA: Diagnosis not present

## 2018-07-05 DIAGNOSIS — M25511 Pain in right shoulder: Secondary | ICD-10-CM | POA: Diagnosis not present

## 2018-07-05 DIAGNOSIS — M6281 Muscle weakness (generalized): Secondary | ICD-10-CM | POA: Diagnosis not present

## 2018-07-05 DIAGNOSIS — M25611 Stiffness of right shoulder, not elsewhere classified: Secondary | ICD-10-CM | POA: Diagnosis not present

## 2018-07-05 LAB — AEROBIC/ANAEROBIC CULTURE W GRAM STAIN (SURGICAL/DEEP WOUND)
Culture: NO GROWTH
Culture: NO GROWTH
Culture: NO GROWTH

## 2018-07-05 LAB — AEROBIC/ANAEROBIC CULTURE (SURGICAL/DEEP WOUND)

## 2018-07-06 ENCOUNTER — Other Ambulatory Visit (HOSPITAL_COMMUNITY): Payer: Self-pay | Admitting: Orthopaedic Surgery

## 2018-07-06 ENCOUNTER — Telehealth: Payer: Self-pay | Admitting: Infectious Disease

## 2018-07-06 DIAGNOSIS — T8149XA Infection following a procedure, other surgical site, initial encounter: Secondary | ICD-10-CM

## 2018-07-06 NOTE — Telephone Encounter (Signed)
I received a phone call from Ophelia Charter,  MD re Mr Younglove who has infected prosthetic shoulder  He had one staged revision and is growing P acnes  Dr. Griffin Basil will sethim up wiith 6 weeks of IV ceftriaxone which I would want to follow with an oral antibiotic  Can we set him up w an EVISIT with [place] in next week?   PLEASE DO NOT CALL HIM UNTIL THIS AFTERNOON OR TOMORROW AM AS DR. VARKEY WANTS TO TALK TO HIM IN PERSON RE THIS FIRST

## 2018-07-06 NOTE — Telephone Encounter (Signed)
Perfect thanks so much!

## 2018-07-06 NOTE — Telephone Encounter (Signed)
I could not tell from the chart, does he have a PICC line in place already or do need to arrange placement?

## 2018-07-07 NOTE — Telephone Encounter (Signed)
Thank you :)

## 2018-07-10 NOTE — Telephone Encounter (Signed)
Thanks Steph!

## 2018-07-10 NOTE — Telephone Encounter (Addendum)
Called [facility] to attempt to get patient scheduled for PICC line placement was told by IR they are not scheduling outpatient PICC placements.  Asked what the alternative was and they were unable to give an answer.  Will call Dr. Rich Fuchs office as well to see if they have had any luck. Left a message with Dr. Rich Fuchs office awaiting a call back. Pricilla Riffle RN

## 2018-07-11 DIAGNOSIS — M6281 Muscle weakness (generalized): Secondary | ICD-10-CM | POA: Diagnosis not present

## 2018-07-11 DIAGNOSIS — M25511 Pain in right shoulder: Secondary | ICD-10-CM | POA: Diagnosis not present

## 2018-07-11 DIAGNOSIS — M25611 Stiffness of right shoulder, not elsewhere classified: Secondary | ICD-10-CM | POA: Diagnosis not present

## 2018-07-11 NOTE — Telephone Encounter (Signed)
Patient was scheduled by Dr. Rich Fuchs office for PICC placement. PICC will be placed 07/12/2018 at [facility].

## 2018-07-11 NOTE — Telephone Encounter (Signed)
Phew!

## 2018-07-11 NOTE — Telephone Encounter (Signed)
The options would be oral antibiotics or admission to the hospital for a line to be placed. The latter is clearly NOT preferable to outpatient placement of a line

## 2018-07-12 ENCOUNTER — Other Ambulatory Visit (HOSPITAL_COMMUNITY): Payer: Self-pay | Admitting: Orthopaedic Surgery

## 2018-07-12 ENCOUNTER — Ambulatory Visit (INDEPENDENT_AMBULATORY_CARE_PROVIDER_SITE_OTHER): Payer: 59 | Admitting: Infectious Diseases

## 2018-07-12 ENCOUNTER — Ambulatory Visit (HOSPITAL_COMMUNITY)
Admission: RE | Admit: 2018-07-12 | Discharge: 2018-07-12 | Disposition: A | Discharge status: Home / Self Care | Payer: 59 | Source: Ambulatory Visit | Attending: Orthopaedic Surgery | Admitting: Orthopaedic Surgery

## 2018-07-12 ENCOUNTER — Encounter: Payer: Self-pay | Admitting: Infectious Diseases

## 2018-07-12 ENCOUNTER — Other Ambulatory Visit: Payer: Self-pay

## 2018-07-12 DIAGNOSIS — T8149XA Infection following a procedure, other surgical site, initial encounter: Secondary | ICD-10-CM | POA: Diagnosis not present

## 2018-07-12 DIAGNOSIS — Y839 Surgical procedure, unspecified as the cause of abnormal reaction of the patient, or of later complication, without mention of misadventure at the time of the procedure: Secondary | ICD-10-CM | POA: Diagnosis not present

## 2018-07-12 DIAGNOSIS — Z452 Encounter for adjustment and management of vascular access device: Secondary | ICD-10-CM | POA: Diagnosis not present

## 2018-07-12 DIAGNOSIS — T8459XA Infection and inflammatory reaction due to other internal joint prosthesis, initial encounter: Secondary | ICD-10-CM | POA: Diagnosis not present

## 2018-07-12 DIAGNOSIS — M75101 Unspecified rotator cuff tear or rupture of right shoulder, not specified as traumatic: Secondary | ICD-10-CM | POA: Diagnosis not present

## 2018-07-12 DIAGNOSIS — T847XXA Infection and inflammatory reaction due to other internal orthopedic prosthetic devices, implants and grafts, initial encounter: Secondary | ICD-10-CM | POA: Diagnosis not present

## 2018-07-12 DIAGNOSIS — B9689 Other specified bacterial agents as the cause of diseases classified elsewhere: Secondary | ICD-10-CM | POA: Diagnosis not present

## 2018-07-12 DIAGNOSIS — M009 Pyogenic arthritis, unspecified: Secondary | ICD-10-CM | POA: Diagnosis not present

## 2018-07-12 DIAGNOSIS — Z96619 Presence of unspecified artificial shoulder joint: Secondary | ICD-10-CM | POA: Diagnosis not present

## 2018-07-12 DIAGNOSIS — M12811 Other specific arthropathies, not elsewhere classified, right shoulder: Secondary | ICD-10-CM | POA: Diagnosis not present

## 2018-07-12 MED ORDER — LIDOCAINE HCL 1 % IJ SOLN
INTRAMUSCULAR | Status: AC
  Filled 2018-07-12: qty 20

## 2018-07-12 MED ORDER — HEPARIN SOD (PORK) LOCK FLUSH 100 UNIT/ML IV SOLN
INTRAVENOUS | Status: AC
  Filled 2018-07-12: qty 5

## 2018-07-12 MED ORDER — LIDOCAINE HCL (PF) 1 % IJ SOLN
INTRAMUSCULAR | Status: DC | PRN
  Administered 2018-07-12: 5 mL

## 2018-07-12 NOTE — Procedures (Signed)
  Procedure: [place] R arm PICC placement   EBL:   minimal Complications:  none immediate  See full dictation in [facility].  Dillard Cannon MD Main # [phone] Pager  [phone]

## 2018-07-12 NOTE — Progress Notes (Addendum)
Name: YOVAN LEEMAN  GQQ:[id number]   DOB: 12-13-1955   PCP: [name], MD  Referring Proivder: Dr. Griffin Basil (Ortho)    Virtual Visit via Telephone Note  I connected with [name] on 07/12/18 at  4:00 PM EDT by telephone and verified that I am speaking with the correct person using two identifiers.   I discussed the limitations, risks, security and privacy concerns of performing an evaluation and management service by telephone and the availability of in person appointments. I also discussed with the patient that there may be a patient responsible charge related to this service. The patient expressed understanding and agreed to proceed.   Chief Complaint  Patient presents with  . Hospitalization Follow-up    prosthetic shoulder infection     History of Present Illness MILFRED KRAMMES is a 63 y.o. male with past medical history hypertension, anxiety, arthritis.  He is a patient of Dr. Rich Fuchs.  He has a history of open rotator cuff repair.  He also has had significant issues with lower extremity wounds/cellulitis. Prior to his recent surgery he was experiencing significant pain in the shoulder that was described to be tight/stiff and very limiting. He had an extensive work-up with negative aspiration following what sounds to have been a significant time on antibiotics for LE wounds (uncertain as to the specific timeline). He underwent a reverse right total shoulder arthroplasty for severe degenerative joint disease/R rotator cuff arthropathy by Dr. Griffin Basil on June 21, 2018. He apparently has a history of questionable infection in the past with mention of wound dehiscence in the past with original surgery. Operative note reviewed the humeral head had evidence of severe  He continues to smoke cigarettes (1/2 ppd), no drug use and frequent alcohol use during the week (1-2 drinks a day). He lives alone but his friend and neighbor is going to help him infuse his antibiotics.    Medical/surgical/social/family history have been updated during today's visit.    Observations/Objective: Richardson Landry sounds well on the phone in discussion today. He is nervous about the PICC line but feels comfortable with the plan for treatment.   Assessment and Plan: Problem List Items Addressed This Visit      Unprioritized   Infection of prosthetic shoulder joint [facility])    Mr. Delap is now 3 weeks post op following reverse right total shoulder arthroplasty. 2/5 intraoperative tissues with rare propionibacterium acnes. He sounds to be healing well. He is currently not on antibiotics. He reports his pain to be improved and is working through physical therapy and has had 2 sessions thus far. He has had no signs of measured or subjective fevers since his surgery date. Has an upcoming appointment with ortho team soon.   He had his PICC line placed earlier this morning and now getting his first infusion of ceftriaxone as we are speaking on the phone. We discussed recommendation for intravenous therapy x 6 weeks with consideration of oral therapy thereafter for a period of time. He has discussed with his home health team and understands they will be coming to his home once a week for line care and lab work.   OPAT ORDERS:  Diagnosis: R prosthetic shoulder infection   Culture Result: propionibacterium acnes   No Known Allergies  Discharge antibiotics: Ceftriaxone 2 gm IV q24h  Duration: 6 weeks   End Date: May 20th  [facility] Care and Maintenance Per Protocol _x_ Please pull PIC at completion of IV antibiotics __ Please leave PIC in  place until doctor has seen patient or been notified  Labs weekly while on IV antibiotics: _x_ CBC with differential _x_ BMP __ BMP TWICE WEEKLY** __ CMP _x_ CRP _x_ ESR __ Vancomycin trough  Fax weekly labs to [phone]           Follow Up Instructions: Will schedule another EVisit in 4 weeks to see how he is doing.  He has an appointment  with his orthopedic team in the coming week and will continue to follow with Dr. Griffin Basil.    I discussed the assessment and treatment plan with the patient. The patient was provided an opportunity to ask questions and all were answered. The patient agreed with the plan and demonstrated an understanding of the instructions.   The patient was advised to call back or seek an in-person evaluation if the symptoms worsen or if the condition fails to improve as anticipated.  I provided 20 minutes of non-face-to-face time and 10 minutes of record review/coordination of care was provided during this encounter.   Janene Madeira, MSN, NP-C [facility] [email or web address] Pager: [phone] Office: [phone] [place]: [phone]

## 2018-07-12 NOTE — Assessment & Plan Note (Signed)
Mr. Goynes is now 3 weeks post op following reverse right total shoulder arthroplasty. 2/5 intraoperative tissues with rare propionibacterium acnes. He sounds to be healing well. He is currently not on antibiotics. He reports his pain to be improved and is working through physical therapy and has had 2 sessions thus far. He has had no signs of measured or subjective fevers since his surgery date. Has an upcoming appointment with ortho team soon.   He had his PICC line placed earlier this morning and now getting his first infusion of ceftriaxone as we are speaking on the phone. We discussed recommendation for intravenous therapy x 6 weeks with consideration of oral therapy thereafter for a period of time. He has discussed with his home health team and understands they will be coming to his home once a week for line care and lab work.   OPAT ORDERS:  Diagnosis: R prosthetic shoulder infection   Culture Result: propionibacterium acnes   No Known Allergies  Discharge antibiotics: Ceftriaxone 2 gm IV q24h  Duration: 6 weeks   End Date: May 20th  [facility] Care and Maintenance Per Protocol _x_ Please pull PIC at completion of IV antibiotics __ Please leave PIC in place until doctor has seen patient or been notified  Labs weekly while on IV antibiotics: _x_ CBC with differential _x_ BMP __ BMP TWICE WEEKLY** __ CMP _x_ CRP _x_ ESR __ Vancomycin trough  Fax weekly labs to [phone]

## 2018-07-13 DIAGNOSIS — M25611 Stiffness of right shoulder, not elsewhere classified: Secondary | ICD-10-CM | POA: Diagnosis not present

## 2018-07-13 DIAGNOSIS — M6281 Muscle weakness (generalized): Secondary | ICD-10-CM | POA: Diagnosis not present

## 2018-07-13 DIAGNOSIS — M25511 Pain in right shoulder: Secondary | ICD-10-CM | POA: Diagnosis not present

## 2018-07-18 DIAGNOSIS — B9689 Other specified bacterial agents as the cause of diseases classified elsewhere: Secondary | ICD-10-CM | POA: Diagnosis not present

## 2018-07-18 DIAGNOSIS — Z452 Encounter for adjustment and management of vascular access device: Secondary | ICD-10-CM | POA: Diagnosis not present

## 2018-07-18 DIAGNOSIS — M25611 Stiffness of right shoulder, not elsewhere classified: Secondary | ICD-10-CM | POA: Diagnosis not present

## 2018-07-18 DIAGNOSIS — M25511 Pain in right shoulder: Secondary | ICD-10-CM | POA: Diagnosis not present

## 2018-07-18 DIAGNOSIS — M6281 Muscle weakness (generalized): Secondary | ICD-10-CM | POA: Diagnosis not present

## 2018-07-18 DIAGNOSIS — T847XXA Infection and inflammatory reaction due to other internal orthopedic prosthetic devices, implants and grafts, initial encounter: Secondary | ICD-10-CM | POA: Diagnosis not present

## 2018-07-20 DIAGNOSIS — M12811 Other specific arthropathies, not elsewhere classified, right shoulder: Secondary | ICD-10-CM | POA: Diagnosis not present

## 2018-07-20 DIAGNOSIS — M6281 Muscle weakness (generalized): Secondary | ICD-10-CM | POA: Diagnosis not present

## 2018-07-20 DIAGNOSIS — M75101 Unspecified rotator cuff tear or rupture of right shoulder, not specified as traumatic: Secondary | ICD-10-CM | POA: Diagnosis not present

## 2018-07-20 DIAGNOSIS — M25611 Stiffness of right shoulder, not elsewhere classified: Secondary | ICD-10-CM | POA: Diagnosis not present

## 2018-07-20 DIAGNOSIS — M25511 Pain in right shoulder: Secondary | ICD-10-CM | POA: Diagnosis not present

## 2018-07-21 DIAGNOSIS — M25511 Pain in right shoulder: Secondary | ICD-10-CM | POA: Diagnosis not present

## 2018-07-22 DIAGNOSIS — T847XXA Infection and inflammatory reaction due to other internal orthopedic prosthetic devices, implants and grafts, initial encounter: Secondary | ICD-10-CM | POA: Diagnosis not present

## 2018-07-22 DIAGNOSIS — B9689 Other specified bacterial agents as the cause of diseases classified elsewhere: Secondary | ICD-10-CM | POA: Diagnosis not present

## 2018-07-22 DIAGNOSIS — Z452 Encounter for adjustment and management of vascular access device: Secondary | ICD-10-CM | POA: Diagnosis not present

## 2018-07-25 DIAGNOSIS — M12811 Other specific arthropathies, not elsewhere classified, right shoulder: Secondary | ICD-10-CM | POA: Diagnosis not present

## 2018-07-25 DIAGNOSIS — M6281 Muscle weakness (generalized): Secondary | ICD-10-CM | POA: Diagnosis not present

## 2018-07-25 DIAGNOSIS — M25611 Stiffness of right shoulder, not elsewhere classified: Secondary | ICD-10-CM | POA: Diagnosis not present

## 2018-07-25 DIAGNOSIS — B9689 Other specified bacterial agents as the cause of diseases classified elsewhere: Secondary | ICD-10-CM | POA: Diagnosis not present

## 2018-07-25 DIAGNOSIS — T847XXA Infection and inflammatory reaction due to other internal orthopedic prosthetic devices, implants and grafts, initial encounter: Secondary | ICD-10-CM | POA: Diagnosis not present

## 2018-07-25 DIAGNOSIS — Z452 Encounter for adjustment and management of vascular access device: Secondary | ICD-10-CM | POA: Diagnosis not present

## 2018-07-25 DIAGNOSIS — M25511 Pain in right shoulder: Secondary | ICD-10-CM | POA: Diagnosis not present

## 2018-07-25 DIAGNOSIS — M75101 Unspecified rotator cuff tear or rupture of right shoulder, not specified as traumatic: Secondary | ICD-10-CM | POA: Diagnosis not present

## 2018-07-27 DIAGNOSIS — M25611 Stiffness of right shoulder, not elsewhere classified: Secondary | ICD-10-CM | POA: Diagnosis not present

## 2018-07-27 DIAGNOSIS — M6281 Muscle weakness (generalized): Secondary | ICD-10-CM | POA: Diagnosis not present

## 2018-07-27 DIAGNOSIS — M25511 Pain in right shoulder: Secondary | ICD-10-CM | POA: Diagnosis not present

## 2018-08-01 ENCOUNTER — Telehealth: Payer: Self-pay | Admitting: *Deleted

## 2018-08-01 DIAGNOSIS — B9689 Other specified bacterial agents as the cause of diseases classified elsewhere: Secondary | ICD-10-CM | POA: Diagnosis not present

## 2018-08-01 DIAGNOSIS — Z452 Encounter for adjustment and management of vascular access device: Secondary | ICD-10-CM | POA: Diagnosis not present

## 2018-08-01 DIAGNOSIS — M75101 Unspecified rotator cuff tear or rupture of right shoulder, not specified as traumatic: Secondary | ICD-10-CM | POA: Diagnosis not present

## 2018-08-01 DIAGNOSIS — M12811 Other specific arthropathies, not elsewhere classified, right shoulder: Secondary | ICD-10-CM | POA: Diagnosis not present

## 2018-08-01 DIAGNOSIS — M25611 Stiffness of right shoulder, not elsewhere classified: Secondary | ICD-10-CM | POA: Diagnosis not present

## 2018-08-01 DIAGNOSIS — T847XXA Infection and inflammatory reaction due to other internal orthopedic prosthetic devices, implants and grafts, initial encounter: Secondary | ICD-10-CM | POA: Diagnosis not present

## 2018-08-01 DIAGNOSIS — M25511 Pain in right shoulder: Secondary | ICD-10-CM | POA: Diagnosis not present

## 2018-08-01 DIAGNOSIS — M6281 Muscle weakness (generalized): Secondary | ICD-10-CM | POA: Diagnosis not present

## 2018-08-01 NOTE — Telephone Encounter (Signed)
Amy, Home health nurse with [facility], was at patient's home today for lab draw and PICC dressing change. She noted a rash on his leg, asked if it was anywhere else. He had not noticed anything, but per Amy, the rash is spread all across his trunk. He does not know how long this has been present. Please advise. Landis Gandy, RN

## 2018-08-02 DIAGNOSIS — I10 Essential (primary) hypertension: Secondary | ICD-10-CM | POA: Diagnosis not present

## 2018-08-02 DIAGNOSIS — T847XXA Infection and inflammatory reaction due to other internal orthopedic prosthetic devices, implants and grafts, initial encounter: Secondary | ICD-10-CM | POA: Diagnosis not present

## 2018-08-02 DIAGNOSIS — B9689 Other specified bacterial agents as the cause of diseases classified elsewhere: Secondary | ICD-10-CM | POA: Diagnosis not present

## 2018-08-02 NOTE — Telephone Encounter (Signed)
RN relayed verbal order to [facility].  They will send out the medication in an elastomeric pump (balloon infusion) for patient's convenience.  She will send the order to nursing today for lab draw as well. RN spoke with patient, relayed changes. He will use benadryl for the rash, will call us and [facility] pharmacy if he has any questions or concerns.

## 2018-08-02 NOTE — Telephone Encounter (Signed)
If we have ruled out any other causes for his rash I want to switch his ceftriaxone to a continuous penicillin pump, 20 million units Q24h IV to complete his 6 weeks of treatment with the same end date from previous orders. Would have him start taking benadryl over the counter 1-2 tabs as directed on the box to help with reducing itching if he is experiencing that.   There is a small possibility that he may experience the same rash with penicillin so will need to continue to pay attention to this. It sounds that he is having some pretty mild symptoms.   Please have home health draw a differential and CMET.   If he does not want to do a continuous IV infusion for this I will call him with other options. This would be my preferred option, however. Reviewed with ID pharmacist, Cassie and in agreement with plan. I will sign orders today to fax over.

## 2018-08-04 DIAGNOSIS — M109 Gout, unspecified: Secondary | ICD-10-CM | POA: Diagnosis not present

## 2018-08-04 DIAGNOSIS — M75101 Unspecified rotator cuff tear or rupture of right shoulder, not specified as traumatic: Secondary | ICD-10-CM | POA: Diagnosis not present

## 2018-08-04 DIAGNOSIS — M25562 Pain in left knee: Secondary | ICD-10-CM | POA: Diagnosis not present

## 2018-08-04 DIAGNOSIS — M12811 Other specific arthropathies, not elsewhere classified, right shoulder: Secondary | ICD-10-CM | POA: Diagnosis not present

## 2018-08-08 DIAGNOSIS — M6281 Muscle weakness (generalized): Secondary | ICD-10-CM | POA: Diagnosis not present

## 2018-08-08 DIAGNOSIS — M25611 Stiffness of right shoulder, not elsewhere classified: Secondary | ICD-10-CM | POA: Diagnosis not present

## 2018-08-08 DIAGNOSIS — M25511 Pain in right shoulder: Secondary | ICD-10-CM | POA: Diagnosis not present

## 2018-08-09 ENCOUNTER — Other Ambulatory Visit: Payer: Self-pay

## 2018-08-09 ENCOUNTER — Ambulatory Visit (INDEPENDENT_AMBULATORY_CARE_PROVIDER_SITE_OTHER): Payer: 59 | Admitting: Infectious Diseases

## 2018-08-09 ENCOUNTER — Encounter: Payer: Self-pay | Admitting: Infectious Diseases

## 2018-08-09 DIAGNOSIS — L270 Generalized skin eruption due to drugs and medicaments taken internally: Secondary | ICD-10-CM | POA: Insufficient documentation

## 2018-08-09 DIAGNOSIS — Z95828 Presence of other vascular implants and grafts: Secondary | ICD-10-CM

## 2018-08-09 DIAGNOSIS — T8450XD Infection and inflammatory reaction due to unspecified internal joint prosthesis, subsequent encounter: Secondary | ICD-10-CM | POA: Diagnosis not present

## 2018-08-09 DIAGNOSIS — T8459XA Infection and inflammatory reaction due to other internal joint prosthesis, initial encounter: Secondary | ICD-10-CM | POA: Diagnosis not present

## 2018-08-09 DIAGNOSIS — Z5181 Encounter for therapeutic drug level monitoring: Secondary | ICD-10-CM | POA: Insufficient documentation

## 2018-08-09 DIAGNOSIS — Z96619 Presence of unspecified artificial shoulder joint: Secondary | ICD-10-CM

## 2018-08-09 DIAGNOSIS — M109 Gout, unspecified: Secondary | ICD-10-CM | POA: Diagnosis not present

## 2018-08-09 DIAGNOSIS — I10 Essential (primary) hypertension: Secondary | ICD-10-CM | POA: Diagnosis not present

## 2018-08-09 MED ORDER — AMOXICILLIN 500 MG PO CAPS: 500.0000 mg | ORAL_CAPSULE | Freq: Three times a day (TID) | ORAL | 0 refills | Status: AC

## 2018-08-09 NOTE — Assessment & Plan Note (Signed)
He experienced a full-body morbilliform itchy rash about 3 weeks into treatment with his high-dose ceftriaxone.  Reportedly his rash has improved since switching from cephalosporin to penicillin alone.

## 2018-08-09 NOTE — Assessment & Plan Note (Signed)
Site unremarkable, well maintained and functioning as expected. Continue care and maintenance until planned end of IV antibiotics. Home Health team to remove at completion.   

## 2018-08-09 NOTE — Assessment & Plan Note (Signed)
All OPAT lab work reviewed and within normal limits.  No signs of any kidney dysfunction, normal liver function, white blood cell count within normal limits including eosinophils.  He was asking about a uric acid level.  I will ask home health to add this lab on and fax results to his PCP [name] as requested.

## 2018-08-09 NOTE — Progress Notes (Signed)
Name: Fernando [name]  WUJ:[id number]   DOB: 04-17-1955   PCP: Fernando Solian, MD  Referring Proivder: Dr. Griffin [name] (Ortho)    Virtual Visit via Telephone Note  I connected with Fernando [name] on 08/09/18 at  4:00 PM EDT by telephone and verified that I am speaking with the correct person using two identifiers.   I discussed the limitations, risks, security and privacy concerns of performing an evaluation and management service by telephone and the availability of in person appointments. I also discussed with the patient that there may be a patient responsible charge related to this service. The patient expressed understanding and agreed to proceed.   Chief Complaint  Patient presents with  . Follow-up     History of Present Illness Fernando [name] is a 63 y.o. male with past medical history hypertension, anxiety, arthritis.  He is a patient of Dr. Rich [name].  He has a history of open rotator cuff repair.  He also has had significant issues with lower extremity wounds/cellulitis. Prior to his recent surgery he was experiencing significant pain in the shoulder that was described to be tight/stiff and very limiting. He had an extensive work-up with negative aspiration following what sounds to have been a significant time on antibiotics for LE wounds (uncertain as to the specific timeline). He underwent a reverse right total shoulder arthroplasty for severe degenerative joint disease/R rotator cuff arthropathy by Dr. Griffin [name] on June 21, 2018. He apparently has a history of questionable infection in the past with mention of wound dehiscence in the past with original surgery. Operative note reviewed the humeral head had evidence of severe damage due to questionable infectious component.  Intraoperative cultures were held for 14 days and did yield growth of Propionibacterium acnes.    On April 8 he was started on ceftriaxone 2 g once daily.  His neighbor, Fernando [name], has been helping him with infusions and  maintenance of the line.  Fernando [name] was doing well until his home health nurse visited his house on April 28.  She noticed a diffuse red rash along the patient's legs.  She noted quickly that this extended up to his trunk.  At the time Fernando [name] was not aware how long it had been present but realize that it became more itchy.  He had no findings in the mouth, nose, eyes or anywhere on the face.  Due to concern for drug rash related to ceftriaxone we elected to switch to penicillin therapy 20,000,000 units every 24 hours via continuous pump to complete his 6 weeks of treatment through May 20.  Since this time he tells me he has felt much better.  His rash is nearly resolved.  No further swelling in his legs.  Last week he followed up with Dr. Griffin [name].  His surgical incision is well healed over and he has had good range of motion with no pain.  No discharge or drainage or concern for poor wound healing.  He did have a gout flare involving the left knee this was aspirated in the office and he was started on allopurinol once daily for maintenance therapy.  He estimates this is his third gout flareup in the last year.  He has never required suppressive therapy in the past.  He is not taking any diuretics and tries to stay away from trigger foods like red meat, mushrooms, strawberries.  The knee pain has resolved after the aspiration and he is feeling much better.  He has had no fevers, chills, night  sweats.  PICC line is without pain, drainage or erythema and is well maintained by Fernando [name]. No swelling or altered sensation in affected distal extremity.   Medical/surgical/social/family history have been updated during today's visit.    Observations/Objective: Fernando [name] sounds well on the phone in discussion today.  His neighbor Fernando [name] is on the line as well.  I do not appreciate any shortness of breath.  He is in good spirits.  His thought content/mood/judgment appear intact.  Assessment and Plan: Problem List Items  Addressed This Visit      Unprioritized   Drug rash     He experienced a full-body morbilliform itchy rash about 3 weeks into treatment with his high-dose ceftriaxone.  Reportedly his rash has improved since switching from cephalosporin to penicillin alone.       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Fernando [name])    Fernando [name] is doing well 28 days into treatment.  We will continue the penicillin therapy 20,000,000 units intravenously via continuous pump through the planned date of May 20.  At this point we will plan on transitioning him to amoxicillin 500 mg 3 times daily for another 2 weeks.  We will have him come into the office at that point for in person follow-up and lab work.  He did have a bump in his inflammatory markers with a CRP acutely rising to 42, however this was in the setting of a systemic rash as well as what sounds to have been a gout flare.  Considering his shoulder has remained benign this is likely not reflective of an infectious process.  We will continue to follow therapy.  I scheduled an office visit and informed him of the details and where our clinic is located on the call today.      Medication monitoring encounter    All OPAT lab work reviewed and within normal limits.  No signs of any kidney dysfunction, normal liver function, white blood cell count within normal limits including eosinophils.  He was asking about a uric acid level.  I will ask home health to add this lab on and fax results to his PCP Dr. Dagmar [name] as requested.       S/P PICC central line placement    Site unremarkable, well maintained and functioning as expected. Continue care and maintenance until planned end of IV antibiotics. Home Health [name] to remove at completion.          Follow Up Instructions: Will schedule another EVisit in 4 weeks to see how he is doing.  He has an appointment with his orthopedic [name] in the coming week and will continue to follow with Dr. Griffin [name].    I discussed the assessment  and treatment plan with the patient. The patient was provided an opportunity to ask questions and all were answered. The patient agreed with the plan and demonstrated an understanding of the instructions.   The patient was advised to call back or seek an in-person evaluation if the symptoms worsen or if the condition fails to improve as anticipated.  I provided 12 minutes of non-face-to-face time and 10 minutes of record review/coordination of care was provided during this encounter.   Janene Madeira, MSN, NP-C [facility] for [email or web address] [email or web address] Pager: [phone] Office: [phone] [place]: [phone]

## 2018-08-09 NOTE — Assessment & Plan Note (Signed)
Fernando [name] is doing well 28 days into treatment.  We will continue the penicillin therapy 20,000,000 units intravenously via continuous pump through the planned date of May 20.  At this point we will plan on transitioning him to amoxicillin 500 mg 3 times daily for another 2 weeks.  We will have him come into the office at that point for in person follow-up and lab work.  He did have a bump in his inflammatory markers with a CRP acutely rising to 42, however this was in the setting of a systemic rash as well as what sounds to have been a gout flare.  Considering his shoulder has remained benign this is likely not reflective of an infectious process.  We will continue to follow therapy.  I scheduled an office visit and informed him of the details and where our clinic is located on the call today.

## 2018-08-10 ENCOUNTER — Telehealth: Payer: Self-pay

## 2018-08-10 DIAGNOSIS — M25611 Stiffness of right shoulder, not elsewhere classified: Secondary | ICD-10-CM | POA: Diagnosis not present

## 2018-08-10 DIAGNOSIS — M6281 Muscle weakness (generalized): Secondary | ICD-10-CM | POA: Diagnosis not present

## 2018-08-10 DIAGNOSIS — M12811 Other specific arthropathies, not elsewhere classified, right shoulder: Secondary | ICD-10-CM | POA: Diagnosis not present

## 2018-08-10 DIAGNOSIS — T847XXA Infection and inflammatory reaction due to other internal orthopedic prosthetic devices, implants and grafts, initial encounter: Secondary | ICD-10-CM | POA: Diagnosis not present

## 2018-08-10 DIAGNOSIS — M25511 Pain in right shoulder: Secondary | ICD-10-CM | POA: Diagnosis not present

## 2018-08-10 DIAGNOSIS — M75101 Unspecified rotator cuff tear or rupture of right shoulder, not specified as traumatic: Secondary | ICD-10-CM | POA: Diagnosis not present

## 2018-08-10 NOTE — Telephone Encounter (Signed)
Frederik Pear, nurse at [facility], to add a Uric acid test per Janene Madeira NP.   Sonia agreed to check if it could be added on from Monday's blood draw if not she will draw another test today when he sees patient to change patient's dressing.   Lenore Cordia, [place]

## 2018-08-10 NOTE — Telephone Encounter (Signed)
-----   Message from [place] Callas, NP sent at 08/09/2018  5:21 PM EDT ----- Will you please call advanced home care to ask them to add a uric acid level for Mr. Fernando [name] from yesterday afternoon please?

## 2018-08-11 NOTE — Telephone Encounter (Signed)
Thank you :)

## 2018-08-15 DIAGNOSIS — M25611 Stiffness of right shoulder, not elsewhere classified: Secondary | ICD-10-CM | POA: Diagnosis not present

## 2018-08-15 DIAGNOSIS — M25511 Pain in right shoulder: Secondary | ICD-10-CM | POA: Diagnosis not present

## 2018-08-15 DIAGNOSIS — M6281 Muscle weakness (generalized): Secondary | ICD-10-CM | POA: Diagnosis not present

## 2018-08-17 DIAGNOSIS — M12811 Other specific arthropathies, not elsewhere classified, right shoulder: Secondary | ICD-10-CM | POA: Diagnosis not present

## 2018-08-17 DIAGNOSIS — M25511 Pain in right shoulder: Secondary | ICD-10-CM | POA: Diagnosis not present

## 2018-08-17 DIAGNOSIS — M6281 Muscle weakness (generalized): Secondary | ICD-10-CM | POA: Diagnosis not present

## 2018-08-17 DIAGNOSIS — M75101 Unspecified rotator cuff tear or rupture of right shoulder, not specified as traumatic: Secondary | ICD-10-CM | POA: Diagnosis not present

## 2018-08-17 DIAGNOSIS — M25611 Stiffness of right shoulder, not elsewhere classified: Secondary | ICD-10-CM | POA: Diagnosis not present

## 2018-08-21 ENCOUNTER — Telehealth: Payer: Self-pay | Admitting: *Deleted

## 2018-08-21 DIAGNOSIS — I872 Venous insufficiency (chronic) (peripheral): Secondary | ICD-10-CM

## 2018-08-21 MED ORDER — HYDROXYZINE HCL 10 MG PO TABS: 10.0000 mg | ORAL_TABLET | Freq: Three times a day (TID) | ORAL | 0 refills | Status: DC | PRN

## 2018-08-21 NOTE — Telephone Encounter (Signed)
Confirmed CRP/ESR orders  with Stanton Kidney at [place].  RN spoke with patient, relayed Stephanie's advice.  Patient stated he understood, but did not appear to be paying much attention to instructions.  RN asked him to use compression stockings, elevate his feet, use lotion, and sent in hydroxyzine to his pharmacy.  RN encouraged him to keep his finger nails short and clean, reach back out to [place] if this does not get better. Landis Gandy, RN

## 2018-08-21 NOTE — Addendum Note (Signed)
Addended by: Landis Gandy on: 08/21/2018 03:09 PM   Modules accepted: Orders

## 2018-08-21 NOTE — Telephone Encounter (Signed)
Received call from [facility] at [place].  Bracken is at the home, drawing labs.  She notes that the patient has "cellulitis like rash" on both lower legs - numerous open wounds, redness, swollen, firm and warm to the touch.  Patient's skin is dry and scaly; curel/olive oil/coconut oil have helped, but only temporarily. Per nurse, the redness/swelling/heat started over the weekend.  Patient is scratching his legs causing the open wounds.  Nurse Davy Pique is at the home now, drawing only CMP/CBC with diff.  She states there are no orders for ESR/CRP.  RN gave verbal order for those labs to be drawn with other labs, preventing a return visit.  Will cancel these labs if they are not needed.  Patient will start last dose of IV antibiotics this afternoon at 4:30. He is supposed to pick up amoxicillin and start the day after he finished IV antibiotics.   He is scheduled for follow up at [facility] 6/1.  He was confused, though he was coming here 5/20 instead of switching from IV to oral antibiotics on 5/20.  Please advise if he should continue last dose of antibiotics, if he needs follow up sooner, if he should continue with oral antibiotics. Landis Gandy, RN

## 2018-08-21 NOTE — Telephone Encounter (Signed)
Yes please draw the CRP/ESR.   No change to planned end date for IV antibiotics - continue through 5/19 dose and then start amoxicillin as prescribed until we see him on 6/1.    It would be unlikely that he would be developing a cellulitis on both legs let alone while on penicillin. I suspect this is more related to stasis dermatitis considering the complaint of itching. If he has swelling of his legs would recommend compression stockings as well to help with fluid.   We can send in some hydroxyzine 25 mg TID as needed for itching #60, refills 0 if he needs something for the itching.   Definitely agree with moisturizer multiple times a day. He may also benefit from a steroid lotion if he has a primary care provider or dermatologist he works with. He can keep current appointment and if the rash is worsening for him can see Dr. Tommy Medal (who is also familiar with him based on original consult via phone with his surgeon) this week if available or me early next week when I rotate back to clinic.

## 2018-08-22 DIAGNOSIS — M25611 Stiffness of right shoulder, not elsewhere classified: Secondary | ICD-10-CM | POA: Diagnosis not present

## 2018-08-22 DIAGNOSIS — M6281 Muscle weakness (generalized): Secondary | ICD-10-CM | POA: Diagnosis not present

## 2018-08-22 DIAGNOSIS — M25511 Pain in right shoulder: Secondary | ICD-10-CM | POA: Diagnosis not present

## 2018-08-23 ENCOUNTER — Telehealth: Payer: Self-pay | Admitting: *Deleted

## 2018-08-23 NOTE — Telephone Encounter (Signed)
error 

## 2018-08-23 NOTE — Telephone Encounter (Signed)
Mary called to confirm pull PICC order at end of therapy per Richardson Landry. Per Stephanie's note, PICC to be pulled by home health at completion of iv antibiotics. Relayed to [place]. Landis Gandy, RN

## 2018-08-31 ENCOUNTER — Telehealth: Payer: Self-pay | Admitting: Infectious Diseases

## 2018-08-31 NOTE — Telephone Encounter (Signed)
COVID-19 Pre-Screening Questions: ° °Do you currently have a fever (>100 °F), chills or unexplained body aches? No  ° °Are you currently experiencing new cough, shortness of breath, sore throat, runny nose?no   °•  °Have you recently travelled outside the state of [place] in the last 14 days? No  °•  °1. Have you been in contact with someone that is currently pending confirmation of Covid19 testing or has been confirmed to have the Covid19 virus?  No  ° °

## 2018-09-04 ENCOUNTER — Encounter: Payer: Self-pay | Admitting: Infectious Diseases

## 2018-09-04 ENCOUNTER — Other Ambulatory Visit: Payer: Self-pay

## 2018-09-04 ENCOUNTER — Ambulatory Visit (INDEPENDENT_AMBULATORY_CARE_PROVIDER_SITE_OTHER): Payer: 59 | Admitting: Infectious Diseases

## 2018-09-04 VITALS — BP 126/78 | HR 109 | Temp 98.0°F | Wt 172.0 lb

## 2018-09-04 DIAGNOSIS — T8459XA Infection and inflammatory reaction due to other internal joint prosthesis, initial encounter: Secondary | ICD-10-CM | POA: Diagnosis not present

## 2018-09-04 DIAGNOSIS — I872 Venous insufficiency (chronic) (peripheral): Secondary | ICD-10-CM

## 2018-09-04 DIAGNOSIS — Z5181 Encounter for therapeutic drug level monitoring: Secondary | ICD-10-CM

## 2018-09-04 DIAGNOSIS — Z96619 Presence of unspecified artificial shoulder joint: Secondary | ICD-10-CM

## 2018-09-04 MED ORDER — BETAMETHASONE DIPROPIONATE 0.05 % EX CREA: TOPICAL_CREAM | Freq: Two times a day (BID) | CUTANEOUS | 1 refills | Status: DC

## 2018-09-04 NOTE — Progress Notes (Signed)
Name: Fernando [name]  CZY:[id number]   DOB: [birth year]   PCP: [name], MD  Referring Proivder: Dr. Griffin Basil (Ortho)    Chief Complaint  Patient presents with   Follow-up    leg swelling is getting better; new medication allergy?     Brief ID Hx:  Fernando [name] is a 63 y.o. male with past medical history hypertension, anxiety, arthritis. He also has had significant issues with lower extremity wounds/cellulitis.  He has a history of open rotator cuff repair. Prior to his recent surgery in March 2020 he was experiencing significant pain in the shoulder that was described to be tight/stiff and very limiting. He had an extensive work-up with negative aspiration following what sounds to have been a significant time on antibiotics for LE wounds (uncertain as to the specific timeline). He underwent a reverse right total shoulder arthroplasty for severe degenerative joint disease/R rotator cuff arthropathy by Dr. Griffin Basil on June 21, 2018. He apparently has a history of questionable infection in the past with mention of wound dehiscence in the past with original surgery. Operative note reviewed the humeral head had evidence of severe damage due to questionable infectious component.  Intraoperative cultures were held for 14 days and did yield light growth of Propionibacterium acnes.    On July 12 2018 he was started on ceftriaxone 2 g once daily through April 28 when she noticed a diffuse rash on the patient's legs which extended up to his trunk.  He was unable to be seen in the office due to concerns for COVID; due to concern for drug rash related to ceftriaxone we switched him to penicillin 20,000,000 units every 24 hours through May 20th when he transitioned to oral amoxicillin for 2 more weeks.   HPI: He is here today for follow-up of his right shoulder infection.  He tells me that his range of motion has improved significantly.  His pain is gone.  His physical therapy team is very happy  with his progress.  His incisions have long since been well-healed.  He goes back to see Dr. Griffin Basil next week.  He says he finished his amoxicillin about 5 days ago now.  And has done well since stopping  His main concern is the wounds on his lower extremities.  He tells me that they have erupted on antibiotics and wonders if they are due to allergies to antibiotics.  They are extremely itchy and has resulted in him scratching significantly causing wounds which are now crusted over.  He says that he has always had trouble with leg swelling but today they look better than they have in the past.  It is always been bilateral.  No trouble in any other area of the body.  Never been worked up for this.  Medical/surgical/social/family history have been updated during today's visit.    Observations/Objective: Physical Exam Constitutional:      Comments: He is seated comfortably in the chair.  Very pleasant and in no distress.  Neck:     Musculoskeletal: Normal range of motion.  Cardiovascular:     Rate and Rhythm: Normal rate and regular rhythm.     Comments: Decreased peripheral pulses Pulmonary:     Effort: Pulmonary effort is normal.     Breath sounds: Normal breath sounds.  Musculoskeletal:     Right lower leg: Edema present.     Left lower leg: Edema present.     Comments: Right shoulder incision is well-healed.  He has good range  of motion and can raise arm up overhead completely as well as abduct, abduct and rotate joint easily.  There is no tenderness to palpation.  Skin:    General: Skin is warm and dry.     Capillary Refill: Capillary refill takes less than 2 seconds.     Findings: Rash present.     Comments: Skin on bilateral lower extremities from feet extending to mid shin is hyperemic, thickened, and very congested/swollen.  Rapid blanching and hyperemic.  Multiple areas of abrasions due to scratching that are scabbed over.  No signs of superinfection of these sites.          Assessment and Plan: Problem List Items Addressed This Visit      Unprioritized   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[place]) - Primary    Fernando [name] is completed 8 weeks of intravenous and oral therapy for Propionibacterium infection of the right shoulder.  He has had excellent range of motion and postoperative state.  His physical therapist is nearly released him.  He goes back to see Dr. Griffin Basil next week.  We will repeat inflammatory markers today to help gauge treatment and plan on having him return in 6 weeks off antibiotics to check in again.       Relevant Orders   C-reactive protein   Sedimentation rate   Medication monitoring encounter    Inflammatory markers today as described above.      Stasis dermatitis of both legs    Fernando [name] very clearly has evidence of bilateral venous stasis dermatitis.  His legs are quite congested and tight today.  I am having a difficult time palpating dorsalis pedis and posterior tibialis pulse sites.  He reports full and adequate sensation in both legs and feet.  I told him of this problem is noninfectious and not related to his antibiotics.  I recommended that he discuss with his primary care provider work-up with regards to vascular imaging occluding consultation with vascular surgery team if needed.  I will prescribe him a steroid cream to help with the intense itching and have recommended he pay better attention to applying a thick emollient lotion multiple times a day to his legs.  He would likely be a candidate for compression therapy as well however with decreased pulses would prefer to hold off this recommendation until ABIs or vascular consult is arranged.         Janene Madeira, MSN, NP-C [facility] for Infectious Disease [email or web address] Pager: [phone] Office: [place]: [phone]

## 2018-09-04 NOTE — Assessment & Plan Note (Signed)
Fernando [name] very clearly has evidence of bilateral venous stasis dermatitis.  His legs are quite congested and tight today.  I am having a difficult time palpating dorsalis pedis and posterior tibialis pulse sites.  He reports full and adequate sensation in both legs and feet.  I told him of this problem is noninfectious and not related to his antibiotics.  I recommended that he discuss with his primary care provider work-up with regards to vascular imaging occluding consultation with vascular surgery team if needed.  I will prescribe him a steroid cream to help with the intense itching and have recommended he pay better attention to applying a thick emollient lotion multiple times a day to his legs.  He would likely be a candidate for compression therapy as well however with decreased pulses would prefer to hold off this recommendation until ABIs or vascular consult is arranged.

## 2018-09-04 NOTE — Assessment & Plan Note (Addendum)
Fernando [name] is completed 8 weeks of intravenous and oral therapy for Propionibacterium infection of the right shoulder.  He has had excellent range of motion and postoperative state.  His physical therapist is nearly released him.  He goes back to see Dr. Griffin Basil next week.  We will repeat inflammatory markers today to help gauge treatment and plan on having him return in 6 weeks off antibiotics to check in again.

## 2018-09-04 NOTE — Assessment & Plan Note (Signed)
Inflammatory markers today as described above.

## 2018-09-04 NOTE — Patient Instructions (Addendum)
Your shoulder seems to be doing quite well.  We will continue off antibiotics and repeat your lab work today.  If everything looks okay we will continue to follow how you are doing off antibiotics and have you return in 6 weeks.    **Things that I want to know in the meantime if you have any worsening range of motion, pain, or swelling or drainage at the incision site as we may need to resume your antibiotics.  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  I want you to talk with your primary care provider about working this up further to include vascular studies and imaging to see if we can get to the bottom as to why this is happening. This may include discussion with a vascular surgeon to see what options are.   In the mean time I want you to pay careful attention to keep your hands nice and clean because there is a risk all the time of introducing bacteria from your hands to these open sites.  For the itching I will send you in a prescription strength steroid cream that you can use several times a day to these areas.  In between I want you to use a thick emollient lotion like Eucerin cream multiple times a day.     Stasis Dermatitis Stasis dermatitis is a long-term (chronic) skin condition that happens when veins can no longer pump blood back to the heart (poor circulation). This condition causes a red or brown scaly rash or sores (ulcers) from the pooling of blood (stasis). This condition usually affects the lower legs. It may affect one leg or both legs. Without treatment, severe stasis dermatitis can lead to other skin conditions and infections. What are the causes? This condition is caused by poor circulation. What increases the risk? You are more likely to develop this condition if:  You are not very active.  You stand for long periods of time.  You have veins that have become enlarged and twisted (varicose veins).  You have leg veins that are  not strong enough to send blood back to the heart (venous insufficiency).  You have had a blood clot.  You have been pregnant many times.  You have had vein surgery.  You are obese.  You have heart or kidney failure.  You are 65 years of age or older.  You have had injuries to your legs in the past. What are the signs or symptoms? Common early symptoms of this condition include:  Itchiness in one or both of your legs.  Swelling in your ankle or leg. This might get better overnight but be worse again during the day.  Skin that looks thin on your ankle and leg.  Red or brown marks that develop slowly.  Skin that is dry, cracked, or easily irritated.  Red, swollen skin that is sore or has a burning feeling.  An achy or heavy feeling after you walk or stand for long periods of time.  Pain. Later and more severe symptoms of this condition include:  Skin that looks shiny.  Small, open sores (ulcers). These are often red or purple and leak fluid.  Skin that feels hard.  Severe itching.  A change in the shape or color of your lower legs.  Severe pain.  Difficulty walking. How is this diagnosed? This condition may be diagnosed based on:  Your symptoms and medical history.  A physical exam. You may also have  tests, including:  Blood tests.  Imaging tests to check blood flow (Doppler ultrasound).  Allergy tests. You may need to see a health care provider who specializes in skin diseases (dermatologist). How is this treated? This condition may be treated with:  Compression stockings or an elastic wrap to improve circulation.  Medicines, such as: ? Corticosteroid creams and ointments. ? Non-corticosteroid medicines applied to the skin (topical). ? Medicine to reduce swelling in the legs (diuretics). ? Antibiotics. ? Medicine to relieve itching (antihistamines).  A bandage (dressing).  A wrap that contains zinc and gelatin (Unna boot). Follow these  instructions at home: Skin care  Moisturize your skin as told by your health care provider. Do not use moisturizers with fragrance. This can irritate your skin.  Apply a cool, wet cloth (cool compress) to the affected areas.  Do not scratch your skin.  Do not rub your skin dry after a bath or shower. Gently pat your skin dry.  Do not use scented soaps, detergents, or perfumes. Medicines  Take or use over-the-counter and prescription medicines only as told by your health care provider.  If you were prescribed an antibiotic medicine, take or use it as told by your health care provider. Do not stop taking or using the antibiotic even if your condition improves. Activity  Walk as told by your health care provider. Walking increases blood flow.  Do calf and ankle exercises throughout the day as told by your health care provider. This will help increase blood flow.  Raise (elevate) your legs above the level of your heart when you are sitting or lying down. Lifestyle  Work with your health care provider to lose weight, if needed.  Do not cross your legs when you sit.  Do not stand or sit in one position for long periods of time.  Wear comfortable, loose-fitting clothing. Circulation in your legs will be worse if you wear tight pants, belts, and waistbands.  Do not use any products that contain nicotine or tobacco, such as cigarettes, e-cigarettes, and chewing tobacco. If you need help quitting, ask your health care provider. General instructions  If you were asked to use one of the following to help with your condition, follow instructions from your health care provider on how to: ? Remove and change any dressing. ? Wear compression stockings. These stockings help to prevent blood clots and reduce swelling in your legs. ? Wear the [employer].  Keep all follow-up visits as told by your health care provider. This is important. Contact a health care provider if:  Your condition does  not improve with treatment.  Your condition gets worse.  You have signs of infection in the affected area. Watch for: ? Swelling. ? Tenderness. ? Redness. ? Soreness. ? Warmth.  You have a fever. Get help right away if:  You notice red streaks coming from the affected area.  Your bone or joint underneath the affected area becomes painful after the skin has healed.  The affected area turns darker.  You feel a deep pain in your leg or groin.  You are short of breath. Summary  Stasis dermatitis is a long-term (chronic) skin condition that happens when veins can no longer pump blood back to the heart (poor circulation).  Wear compression stockings as told by your health care provider. These stockings help to prevent blood clots and reduce swelling in your legs.  Follow instructions from your health care provider about activity, medicines, and lifestyle.  Contact a health care  provider if you have a fever or have signs of infection in the affected area.  Keep all follow-up visits as told by your health care provider. This is important. This information is not intended to replace advice given to you by your health care provider. Make sure you discuss any questions you have with your health care provider. Document Released: 07/01/2005 Document Revised: 08/22/2017 Document Reviewed: 08/22/2017 Elsevier Interactive Patient Education  2019 [employer].

## 2018-09-05 LAB — C-REACTIVE PROTEIN: CRP: 8.8 mg/L — ABNORMAL HIGH (ref <8.0)

## 2018-09-05 LAB — SEDIMENTATION RATE: Sed Rate: 22 mm/h — ABNORMAL HIGH (ref 0–20)

## 2018-09-05 NOTE — Progress Notes (Signed)
Markers improved and nearly resolved. Clinically he was doing very well. Will follow off antibiotics as outlined.

## 2018-10-23 ENCOUNTER — Other Ambulatory Visit: Payer: Self-pay

## 2018-10-23 ENCOUNTER — Ambulatory Visit: Payer: 59 | Admitting: Infectious Diseases

## 2018-10-23 NOTE — Progress Notes (Deleted)
Patient did not answer x 2.  No charge for visit.

## 2018-10-26 ENCOUNTER — Encounter: Payer: Self-pay | Admitting: Infectious Diseases

## 2019-07-11 ENCOUNTER — Other Ambulatory Visit: Payer: Self-pay | Admitting: Infectious Diseases

## 2019-10-24 ENCOUNTER — Other Ambulatory Visit: Payer: Self-pay

## 2019-10-24 ENCOUNTER — Emergency Department (HOSPITAL_COMMUNITY): Payer: Medicare Other

## 2019-10-24 ENCOUNTER — Encounter (HOSPITAL_COMMUNITY): Payer: Self-pay | Admitting: *Deleted

## 2019-10-24 ENCOUNTER — Inpatient Hospital Stay (HOSPITAL_COMMUNITY)
Admission: EM | Admit: 2019-10-24 | Discharge: 2019-11-01 | DRG: 896 | Disposition: A | Discharge status: Home Health | Payer: Medicare Other | Source: Ambulatory Visit | Attending: Internal Medicine | Admitting: Internal Medicine

## 2019-10-24 DIAGNOSIS — J9601 Acute respiratory failure with hypoxia: Secondary | ICD-10-CM | POA: Diagnosis present

## 2019-10-24 DIAGNOSIS — I4510 Unspecified right bundle-branch block: Secondary | ICD-10-CM | POA: Diagnosis present

## 2019-10-24 DIAGNOSIS — E785 Hyperlipidemia, unspecified: Secondary | ICD-10-CM | POA: Diagnosis present

## 2019-10-24 DIAGNOSIS — K709 Alcoholic liver disease, unspecified: Secondary | ICD-10-CM

## 2019-10-24 DIAGNOSIS — K7010 Alcoholic hepatitis without ascites: Secondary | ICD-10-CM | POA: Diagnosis present

## 2019-10-24 DIAGNOSIS — J310 Chronic rhinitis: Secondary | ICD-10-CM | POA: Diagnosis present

## 2019-10-24 DIAGNOSIS — R0902 Hypoxemia: Secondary | ICD-10-CM

## 2019-10-24 DIAGNOSIS — G9341 Metabolic encephalopathy: Secondary | ICD-10-CM | POA: Diagnosis present

## 2019-10-24 DIAGNOSIS — F17210 Nicotine dependence, cigarettes, uncomplicated: Secondary | ICD-10-CM | POA: Diagnosis present

## 2019-10-24 DIAGNOSIS — D696 Thrombocytopenia, unspecified: Secondary | ICD-10-CM

## 2019-10-24 DIAGNOSIS — M1990 Unspecified osteoarthritis, unspecified site: Secondary | ICD-10-CM | POA: Diagnosis present

## 2019-10-24 DIAGNOSIS — I10 Essential (primary) hypertension: Secondary | ICD-10-CM | POA: Diagnosis present

## 2019-10-24 DIAGNOSIS — J439 Emphysema, unspecified: Secondary | ICD-10-CM | POA: Diagnosis present

## 2019-10-24 DIAGNOSIS — F1020 Alcohol dependence, uncomplicated: Secondary | ICD-10-CM

## 2019-10-24 DIAGNOSIS — I5021 Acute systolic (congestive) heart failure: Secondary | ICD-10-CM | POA: Diagnosis not present

## 2019-10-24 DIAGNOSIS — F10239 Alcohol dependence with withdrawal, unspecified: Secondary | ICD-10-CM | POA: Diagnosis not present

## 2019-10-24 DIAGNOSIS — M109 Gout, unspecified: Secondary | ICD-10-CM | POA: Diagnosis present

## 2019-10-24 DIAGNOSIS — F10931 Alcohol use, unspecified with withdrawal delirium: Secondary | ICD-10-CM | POA: Diagnosis present

## 2019-10-24 DIAGNOSIS — Z96611 Presence of right artificial shoulder joint: Secondary | ICD-10-CM | POA: Diagnosis present

## 2019-10-24 DIAGNOSIS — R531 Weakness: Secondary | ICD-10-CM

## 2019-10-24 DIAGNOSIS — Z801 Family history of malignant neoplasm of trachea, bronchus and lung: Secondary | ICD-10-CM

## 2019-10-24 DIAGNOSIS — Z79899 Other long term (current) drug therapy: Secondary | ICD-10-CM

## 2019-10-24 DIAGNOSIS — Z720 Tobacco use: Secondary | ICD-10-CM | POA: Diagnosis present

## 2019-10-24 DIAGNOSIS — F10930 Alcohol use, unspecified with withdrawal, uncomplicated: Secondary | ICD-10-CM

## 2019-10-24 DIAGNOSIS — D539 Nutritional anemia, unspecified: Secondary | ICD-10-CM | POA: Diagnosis present

## 2019-10-24 DIAGNOSIS — Z8249 Family history of ischemic heart disease and other diseases of the circulatory system: Secondary | ICD-10-CM

## 2019-10-24 DIAGNOSIS — J984 Other disorders of lung: Secondary | ICD-10-CM

## 2019-10-24 DIAGNOSIS — F419 Anxiety disorder, unspecified: Secondary | ICD-10-CM | POA: Diagnosis not present

## 2019-10-24 DIAGNOSIS — R269 Unspecified abnormalities of gait and mobility: Secondary | ICD-10-CM

## 2019-10-24 DIAGNOSIS — R000 Tachycardia, unspecified: Secondary | ICD-10-CM | POA: Diagnosis present

## 2019-10-24 DIAGNOSIS — F10230 Alcohol dependence with withdrawal, uncomplicated: Secondary | ICD-10-CM

## 2019-10-24 DIAGNOSIS — E7220 Disorder of urea cycle metabolism, unspecified: Secondary | ICD-10-CM

## 2019-10-24 DIAGNOSIS — W19XXXA Unspecified fall, initial encounter: Secondary | ICD-10-CM

## 2019-10-24 DIAGNOSIS — J189 Pneumonia, unspecified organism: Secondary | ICD-10-CM

## 2019-10-24 DIAGNOSIS — I426 Alcoholic cardiomyopathy: Secondary | ICD-10-CM | POA: Diagnosis present

## 2019-10-24 DIAGNOSIS — R748 Abnormal levels of other serum enzymes: Secondary | ICD-10-CM

## 2019-10-24 DIAGNOSIS — J849 Interstitial pulmonary disease, unspecified: Secondary | ICD-10-CM | POA: Diagnosis present

## 2019-10-24 DIAGNOSIS — I2510 Atherosclerotic heart disease of native coronary artery without angina pectoris: Secondary | ICD-10-CM | POA: Diagnosis present

## 2019-10-24 DIAGNOSIS — F10231 Alcohol dependence with withdrawal delirium: Secondary | ICD-10-CM | POA: Diagnosis not present

## 2019-10-24 DIAGNOSIS — Z20822 Contact with and (suspected) exposure to covid-19: Secondary | ICD-10-CM | POA: Diagnosis present

## 2019-10-24 DIAGNOSIS — I110 Hypertensive heart disease with heart failure: Secondary | ICD-10-CM | POA: Diagnosis present

## 2019-10-24 DIAGNOSIS — K760 Fatty (change of) liver, not elsewhere classified: Secondary | ICD-10-CM | POA: Diagnosis present

## 2019-10-24 DIAGNOSIS — R0602 Shortness of breath: Secondary | ICD-10-CM | POA: Diagnosis not present

## 2019-10-24 LAB — BASIC METABOLIC PANEL
Anion gap: 16 — ABNORMAL HIGH (ref 5–15)
BUN: 7 mg/dL — ABNORMAL LOW (ref 8–23)
CO2: 21 mmol/L — ABNORMAL LOW (ref 22–32)
Calcium: 8.9 mg/dL (ref 8.9–10.3)
Chloride: 99 mmol/L (ref 98–111)
Creatinine, Ser: 0.73 mg/dL (ref 0.61–1.24)
GFR calc Af Amer: >60 mL/min (ref >60)
GFR calc non Af Amer: >60 mL/min (ref >60)
Glucose, Bld: 102 mg/dL — ABNORMAL HIGH (ref 70–99)
Potassium: 3.8 mmol/L (ref 3.5–5.1)
Sodium: 136 mmol/L (ref 135–145)

## 2019-10-24 LAB — CBC
HCT: 38.4 % — ABNORMAL LOW (ref 39.0–52.0)
Hemoglobin: 13.2 g/dL (ref 13.0–17.0)
MCH: 40.1 pg — ABNORMAL HIGH (ref 26.0–34.0)
MCHC: 34.4 g/dL (ref 30.0–36.0)
MCV: 116.7 fL — ABNORMAL HIGH (ref 80.0–100.0)
Platelets: 62 10*3/uL — ABNORMAL LOW (ref 150–400)
RBC: 3.29 MIL/uL — ABNORMAL LOW (ref 4.22–5.81)
RDW: 13.2 % (ref 11.5–15.5)
WBC: 4.8 10*3/uL (ref 4.0–10.5)
nRBC: 0 % (ref 0.0–0.2)

## 2019-10-24 LAB — TROPONIN I (HIGH SENSITIVITY)
Troponin I (High Sensitivity): 18 ng/L — ABNORMAL HIGH (ref <18)
Troponin I (High Sensitivity): 18 ng/L — ABNORMAL HIGH (ref <18)

## 2019-10-24 MED ORDER — SODIUM CHLORIDE 0.9% FLUSH: 3.0000 mL | Freq: Once | INTRAVENOUS | Status: DC

## 2019-10-24 NOTE — ED Triage Notes (Signed)
The pt is c/o sob and weak  He reports that he cannot walk  He came in  By w/c  He was in the hospital in [place] 6 weeks ago  Dr Dagmar Hait sent him here   Today  ???

## 2019-10-25 ENCOUNTER — Inpatient Hospital Stay (HOSPITAL_COMMUNITY): Payer: Medicare Other

## 2019-10-25 ENCOUNTER — Emergency Department (HOSPITAL_COMMUNITY): Payer: Medicare Other

## 2019-10-25 DIAGNOSIS — I5021 Acute systolic (congestive) heart failure: Secondary | ICD-10-CM | POA: Diagnosis not present

## 2019-10-25 DIAGNOSIS — D696 Thrombocytopenia, unspecified: Secondary | ICD-10-CM | POA: Diagnosis present

## 2019-10-25 DIAGNOSIS — J9601 Acute respiratory failure with hypoxia: Secondary | ICD-10-CM | POA: Diagnosis present

## 2019-10-25 DIAGNOSIS — Z20822 Contact with and (suspected) exposure to covid-19: Secondary | ICD-10-CM | POA: Diagnosis present

## 2019-10-25 DIAGNOSIS — R000 Tachycardia, unspecified: Secondary | ICD-10-CM | POA: Diagnosis present

## 2019-10-25 DIAGNOSIS — F10230 Alcohol dependence with withdrawal, uncomplicated: Secondary | ICD-10-CM | POA: Diagnosis not present

## 2019-10-25 DIAGNOSIS — K7010 Alcoholic hepatitis without ascites: Secondary | ICD-10-CM | POA: Diagnosis present

## 2019-10-25 DIAGNOSIS — I110 Hypertensive heart disease with heart failure: Secondary | ICD-10-CM | POA: Diagnosis present

## 2019-10-25 DIAGNOSIS — Z801 Family history of malignant neoplasm of trachea, bronchus and lung: Secondary | ICD-10-CM | POA: Diagnosis not present

## 2019-10-25 DIAGNOSIS — M109 Gout, unspecified: Secondary | ICD-10-CM | POA: Diagnosis present

## 2019-10-25 DIAGNOSIS — E7220 Disorder of urea cycle metabolism, unspecified: Secondary | ICD-10-CM | POA: Diagnosis present

## 2019-10-25 DIAGNOSIS — F17210 Nicotine dependence, cigarettes, uncomplicated: Secondary | ICD-10-CM | POA: Diagnosis present

## 2019-10-25 DIAGNOSIS — R531 Weakness: Secondary | ICD-10-CM

## 2019-10-25 DIAGNOSIS — Z720 Tobacco use: Secondary | ICD-10-CM | POA: Diagnosis present

## 2019-10-25 DIAGNOSIS — M1990 Unspecified osteoarthritis, unspecified site: Secondary | ICD-10-CM | POA: Diagnosis present

## 2019-10-25 DIAGNOSIS — F419 Anxiety disorder, unspecified: Secondary | ICD-10-CM | POA: Diagnosis not present

## 2019-10-25 DIAGNOSIS — I4510 Unspecified right bundle-branch block: Secondary | ICD-10-CM | POA: Diagnosis present

## 2019-10-25 DIAGNOSIS — E785 Hyperlipidemia, unspecified: Secondary | ICD-10-CM | POA: Diagnosis present

## 2019-10-25 DIAGNOSIS — F10931 Alcohol use, unspecified with withdrawal delirium: Secondary | ICD-10-CM | POA: Diagnosis present

## 2019-10-25 DIAGNOSIS — R0602 Shortness of breath: Secondary | ICD-10-CM | POA: Diagnosis present

## 2019-10-25 DIAGNOSIS — J439 Emphysema, unspecified: Secondary | ICD-10-CM | POA: Diagnosis present

## 2019-10-25 DIAGNOSIS — I340 Nonrheumatic mitral (valve) insufficiency: Secondary | ICD-10-CM | POA: Diagnosis not present

## 2019-10-25 DIAGNOSIS — J189 Pneumonia, unspecified organism: Secondary | ICD-10-CM | POA: Diagnosis not present

## 2019-10-25 DIAGNOSIS — D539 Nutritional anemia, unspecified: Secondary | ICD-10-CM | POA: Diagnosis present

## 2019-10-25 DIAGNOSIS — I2510 Atherosclerotic heart disease of native coronary artery without angina pectoris: Secondary | ICD-10-CM | POA: Diagnosis present

## 2019-10-25 DIAGNOSIS — J849 Interstitial pulmonary disease, unspecified: Secondary | ICD-10-CM | POA: Diagnosis present

## 2019-10-25 DIAGNOSIS — F10239 Alcohol dependence with withdrawal, unspecified: Secondary | ICD-10-CM | POA: Diagnosis present

## 2019-10-25 DIAGNOSIS — I10 Essential (primary) hypertension: Secondary | ICD-10-CM | POA: Diagnosis present

## 2019-10-25 DIAGNOSIS — I426 Alcoholic cardiomyopathy: Secondary | ICD-10-CM | POA: Diagnosis present

## 2019-10-25 DIAGNOSIS — I429 Cardiomyopathy, unspecified: Secondary | ICD-10-CM | POA: Diagnosis not present

## 2019-10-25 DIAGNOSIS — J310 Chronic rhinitis: Secondary | ICD-10-CM | POA: Diagnosis present

## 2019-10-25 DIAGNOSIS — K760 Fatty (change of) liver, not elsewhere classified: Secondary | ICD-10-CM | POA: Diagnosis present

## 2019-10-25 DIAGNOSIS — F10231 Alcohol dependence with withdrawal delirium: Secondary | ICD-10-CM | POA: Diagnosis not present

## 2019-10-25 DIAGNOSIS — R748 Abnormal levels of other serum enzymes: Secondary | ICD-10-CM | POA: Diagnosis not present

## 2019-10-25 DIAGNOSIS — G9341 Metabolic encephalopathy: Secondary | ICD-10-CM | POA: Diagnosis present

## 2019-10-25 LAB — CBC WITH DIFFERENTIAL/PLATELET
Abs Immature Granulocytes: 0.03 10*3/uL (ref 0.00–0.07)
Basophils Absolute: 0 10*3/uL (ref 0.0–0.1)
Basophils Relative: 0 %
Eosinophils Absolute: 0 10*3/uL (ref 0.0–0.5)
Eosinophils Relative: 0 %
HCT: 38.8 % — ABNORMAL LOW (ref 39.0–52.0)
Hemoglobin: 13.4 g/dL (ref 13.0–17.0)
Immature Granulocytes: 1 %
Lymphocytes Relative: 9 %
Lymphs Abs: 0.5 10*3/uL — ABNORMAL LOW (ref 0.7–4.0)
MCH: 41.4 pg — ABNORMAL HIGH (ref 26.0–34.0)
MCHC: 34.5 g/dL (ref 30.0–36.0)
MCV: 119.8 fL — ABNORMAL HIGH (ref 80.0–100.0)
Monocytes Absolute: 0.6 10*3/uL (ref 0.1–1.0)
Monocytes Relative: 12 %
Neutro Abs: 3.8 10*3/uL (ref 1.7–7.7)
Neutrophils Relative %: 78 %
Platelets: 57 10*3/uL — ABNORMAL LOW (ref 150–400)
RBC: 3.24 MIL/uL — ABNORMAL LOW (ref 4.22–5.81)
RDW: 13.1 % (ref 11.5–15.5)
WBC: 4.8 10*3/uL (ref 4.0–10.5)
nRBC: 0 % (ref 0.0–0.2)

## 2019-10-25 LAB — COMPREHENSIVE METABOLIC PANEL
ALT: 80 U/L — ABNORMAL HIGH (ref 0–44)
AST: 117 U/L — ABNORMAL HIGH (ref 15–41)
Albumin: 3.7 g/dL (ref 3.5–5.0)
Alkaline Phosphatase: 139 U/L — ABNORMAL HIGH (ref 38–126)
Anion gap: 13 (ref 5–15)
BUN: 10 mg/dL (ref 8–23)
CO2: 25 mmol/L (ref 22–32)
Calcium: 9 mg/dL (ref 8.9–10.3)
Chloride: 100 mmol/L (ref 98–111)
Creatinine, Ser: 0.92 mg/dL (ref 0.61–1.24)
GFR calc Af Amer: >60 mL/min (ref >60)
GFR calc non Af Amer: >60 mL/min (ref >60)
Glucose, Bld: 94 mg/dL (ref 70–99)
Potassium: 4 mmol/L (ref 3.5–5.1)
Sodium: 138 mmol/L (ref 135–145)
Total Bilirubin: 2.8 mg/dL — ABNORMAL HIGH (ref 0.3–1.2)
Total Protein: 7 g/dL (ref 6.5–8.1)

## 2019-10-25 LAB — URINALYSIS, ROUTINE W REFLEX MICROSCOPIC
Bilirubin Urine: NEGATIVE
Glucose, UA: NEGATIVE mg/dL
Ketones, ur: 5 mg/dL — AB
Leukocytes,Ua: NEGATIVE
Nitrite: NEGATIVE
Protein, ur: 100 mg/dL — AB
Specific Gravity, Urine: 1.017 (ref 1.005–1.030)
pH: 6 (ref 5.0–8.0)

## 2019-10-25 LAB — HEPATITIS PANEL, ACUTE
HCV Ab: NONREACTIVE
Hep A IgM: NONREACTIVE
Hep B C IgM: NONREACTIVE
Hepatitis B Surface Ag: NONREACTIVE

## 2019-10-25 LAB — PROTIME-INR
INR: 0.9 (ref 0.8–1.2)
INR: 1 (ref 0.8–1.2)
Prothrombin Time: 12 seconds (ref 11.4–15.2)
Prothrombin Time: 12.8 seconds (ref 11.4–15.2)

## 2019-10-25 LAB — HEPATIC FUNCTION PANEL
ALT: 87 U/L — ABNORMAL HIGH (ref 0–44)
AST: 152 U/L — ABNORMAL HIGH (ref 15–41)
Albumin: 3.8 g/dL (ref 3.5–5.0)
Alkaline Phosphatase: 151 U/L — ABNORMAL HIGH (ref 38–126)
Bilirubin, Direct: 0.9 mg/dL — ABNORMAL HIGH (ref 0.0–0.2)
Indirect Bilirubin: 1.8 mg/dL — ABNORMAL HIGH (ref 0.3–0.9)
Total Bilirubin: 2.7 mg/dL — ABNORMAL HIGH (ref 0.3–1.2)
Total Protein: 7.8 g/dL (ref 6.5–8.1)

## 2019-10-25 LAB — SARS CORONAVIRUS 2 BY RT PCR (HOSPITAL ORDER, PERFORMED IN ~~LOC~~ HOSPITAL LAB): SARS Coronavirus 2: NEGATIVE

## 2019-10-25 LAB — TSH: TSH: 7.096 u[IU]/mL — ABNORMAL HIGH (ref 0.350–4.500)

## 2019-10-25 LAB — HIV ANTIBODY (ROUTINE TESTING W REFLEX): HIV Screen 4th Generation wRfx: NONREACTIVE

## 2019-10-25 LAB — CBG MONITORING, ED: Glucose-Capillary: 155 mg/dL — ABNORMAL HIGH (ref 70–99)

## 2019-10-25 LAB — RAPID URINE DRUG SCREEN, HOSP PERFORMED
Amphetamines: NOT DETECTED
Barbiturates: NOT DETECTED
Benzodiazepines: NOT DETECTED
Cocaine: NOT DETECTED
Opiates: NOT DETECTED
Tetrahydrocannabinol: NOT DETECTED

## 2019-10-25 LAB — AMMONIA: Ammonia: 48 umol/L — ABNORMAL HIGH (ref 9–35)

## 2019-10-25 LAB — ETHANOL: Alcohol, Ethyl (B): <10 mg/dL (ref <10)

## 2019-10-25 LAB — VITAMIN B12: Vitamin B-12: 350 pg/mL (ref 180–914)

## 2019-10-25 LAB — APTT: aPTT: 26 seconds (ref 24–36)

## 2019-10-25 LAB — FOLATE: Folate: 12.4 ng/mL (ref >5.9)

## 2019-10-25 LAB — PHOSPHORUS: Phosphorus: 3.3 mg/dL (ref 2.5–4.6)

## 2019-10-25 LAB — MAGNESIUM: Magnesium: 1.6 mg/dL — ABNORMAL LOW (ref 1.7–2.4)

## 2019-10-25 LAB — BRAIN NATRIURETIC PEPTIDE: B Natriuretic Peptide: 349.4 pg/mL — ABNORMAL HIGH (ref 0.0–100.0)

## 2019-10-25 MED ORDER — ONDANSETRON HCL 4 MG PO TABS: 4.0000 mg | ORAL_TABLET | Freq: Four times a day (QID) | ORAL | Status: DC | PRN

## 2019-10-25 MED ORDER — FOLIC ACID 1 MG PO TABS: 1.0000 mg | ORAL_TABLET | Freq: Every day | ORAL | Status: DC

## 2019-10-25 MED ORDER — THIAMINE HCL 100 MG/ML IJ SOLN
Freq: Once | INTRAVENOUS | Status: AC
  Filled 2019-10-25: qty 1000

## 2019-10-25 MED ORDER — ADULT MULTIVITAMIN W/MINERALS CH
1.0000 | ORAL_TABLET | Freq: Every day | ORAL | Status: DC
  Administered 2019-10-25: 1 via ORAL
  Filled 2019-10-25: qty 1

## 2019-10-25 MED ORDER — LORAZEPAM 1 MG PO TABS: 0.0000 mg | ORAL_TABLET | Freq: Four times a day (QID) | ORAL | Status: DC

## 2019-10-25 MED ORDER — THIAMINE HCL 100 MG/ML IJ SOLN: 100.0000 mg | Freq: Every day | INTRAMUSCULAR | Status: DC

## 2019-10-25 MED ORDER — LORAZEPAM 1 MG PO TABS: 1.0000 mg | ORAL_TABLET | ORAL | Status: DC | PRN

## 2019-10-25 MED ORDER — LORAZEPAM 2 MG/ML IJ SOLN: 0.0000 mg | Freq: Two times a day (BID) | INTRAMUSCULAR | Status: DC

## 2019-10-25 MED ORDER — THIAMINE HCL 100 MG/ML IJ SOLN
100.0000 mg | Freq: Every day | INTRAMUSCULAR | Status: DC
  Filled 2019-10-25: qty 2

## 2019-10-25 MED ORDER — THIAMINE HCL 100 MG PO TABS: 100.0000 mg | ORAL_TABLET | Freq: Every day | ORAL | Status: DC

## 2019-10-25 MED ORDER — LORAZEPAM 2 MG/ML IJ SOLN
0.0000 mg | Freq: Four times a day (QID) | INTRAMUSCULAR | Status: DC
  Administered 2019-10-25 (×2): 2 mg via INTRAVENOUS
  Administered 2019-10-25: 4 mg via INTRAVENOUS
  Administered 2019-10-25: 2 mg via INTRAVENOUS
  Filled 2019-10-25 (×3): qty 1
  Filled 2019-10-25: qty 2

## 2019-10-25 MED ORDER — ALBUTEROL SULFATE (2.5 MG/3ML) 0.083% IN NEBU: 2.5000 mg | INHALATION_SOLUTION | RESPIRATORY_TRACT | Status: DC | PRN

## 2019-10-25 MED ORDER — LORAZEPAM 2 MG/ML IJ SOLN
2.0000 mg | Freq: Once | INTRAMUSCULAR | Status: AC
  Administered 2019-10-25: 2 mg via INTRAVENOUS
  Filled 2019-10-25: qty 1

## 2019-10-25 MED ORDER — ALBUTEROL SULFATE (2.5 MG/3ML) 0.083% IN NEBU
5.0000 mg | INHALATION_SOLUTION | Freq: Once | RESPIRATORY_TRACT | Status: AC
  Administered 2019-10-25: 5 mg via RESPIRATORY_TRACT
  Filled 2019-10-25: qty 6

## 2019-10-25 MED ORDER — IOHEXOL 350 MG/ML SOLN
60.0000 mL | Freq: Once | INTRAVENOUS | Status: AC | PRN
  Administered 2019-10-25: 60 mL via INTRAVENOUS

## 2019-10-25 MED ORDER — LORAZEPAM 2 MG/ML IJ SOLN
1.0000 mg | Freq: Once | INTRAMUSCULAR | Status: DC
  Filled 2019-10-25: qty 1

## 2019-10-25 MED ORDER — METHYLPREDNISOLONE SODIUM SUCC 125 MG IJ SOLR
80.0000 mg | Freq: Two times a day (BID) | INTRAMUSCULAR | Status: DC
  Administered 2019-10-26: 80 mg via INTRAVENOUS
  Filled 2019-10-25: qty 2

## 2019-10-25 MED ORDER — LACTULOSE ENEMA
300.0000 mL | Freq: Once | ORAL | Status: DC
  Filled 2019-10-25: qty 300

## 2019-10-25 MED ORDER — ACETAMINOPHEN 325 MG PO TABS
650.0000 mg | ORAL_TABLET | Freq: Four times a day (QID) | ORAL | Status: DC | PRN
  Administered 2019-10-26: 650 mg via ORAL
  Filled 2019-10-25: qty 2

## 2019-10-25 MED ORDER — IPRATROPIUM BROMIDE 0.02 % IN SOLN
0.5000 mg | Freq: Once | RESPIRATORY_TRACT | Status: AC
  Administered 2019-10-25: 0.5 mg via RESPIRATORY_TRACT
  Filled 2019-10-25: qty 2.5

## 2019-10-25 MED ORDER — LORAZEPAM 1 MG PO TABS: 0.0000 mg | ORAL_TABLET | Freq: Two times a day (BID) | ORAL | Status: DC

## 2019-10-25 MED ORDER — ACETAMINOPHEN 650 MG RE SUPP: 650.0000 mg | Freq: Four times a day (QID) | RECTAL | Status: DC | PRN

## 2019-10-25 MED ORDER — METHYLPREDNISOLONE SODIUM SUCC 125 MG IJ SOLR
125.0000 mg | Freq: Once | INTRAMUSCULAR | Status: AC
  Administered 2019-10-25: 125 mg via INTRAVENOUS
  Filled 2019-10-25: qty 2

## 2019-10-25 MED ORDER — ONDANSETRON HCL 4 MG/2ML IJ SOLN: 4.0000 mg | Freq: Four times a day (QID) | INTRAMUSCULAR | Status: DC | PRN

## 2019-10-25 MED ORDER — FUROSEMIDE 10 MG/ML IJ SOLN
40.0000 mg | Freq: Once | INTRAMUSCULAR | Status: AC
  Administered 2019-10-25: 40 mg via INTRAVENOUS
  Filled 2019-10-25: qty 4

## 2019-10-25 MED ORDER — ALLOPURINOL 100 MG PO TABS
200.0000 mg | ORAL_TABLET | Freq: Every day | ORAL | Status: DC
  Administered 2019-10-26 – 2019-11-01 (×7): 200 mg via ORAL
  Filled 2019-10-25 (×8): qty 2

## 2019-10-25 MED ORDER — LORAZEPAM 2 MG/ML IJ SOLN
1.0000 mg | INTRAMUSCULAR | Status: DC | PRN
  Administered 2019-10-25 (×2): 2 mg via INTRAVENOUS
  Administered 2019-10-25: 4 mg via INTRAVENOUS
  Administered 2019-10-26 (×3): 3 mg via INTRAVENOUS
  Filled 2019-10-25 (×4): qty 2
  Filled 2019-10-25: qty 1

## 2019-10-25 NOTE — ED Provider Notes (Signed)
Called to the lobby due to patient shaking.  Upon my evaluation, patient is noted to be tremoring in the upper and lower extremities. His eyes are open. He does not speak at first, but then answers and is oriented. He does not seem to be confused and was aware of his shaking episode. No noted incontinence or oral trauma.  He denies shortness of breath currently. Denies chest pain, abdominal pain, or other complaints.  Tachycardic and tachypneic, but improving during my assessment. Lungs clear.  No pallor or diaphoresis.   Charge nurse notified of episode and I recommended patient be brought back to a room quite soon. Due to high holding in the department, as well as staffing shortages, we do not have any beds to which we can readily bring the patient.    Vitals:   10/24/19 1942 10/24/19 2226 10/25/19 0027 10/25/19 0315  BP: 134/77 125/88 133/82 (!) 178/99  Pulse: 92 (!) 103 (!) 101 (!) 118  Resp: 18 16 20 20   Temp:  98.4 F (36.9 C)  97.9 F (36.6 C)  TempSrc:  Oral    SpO2: 97% 96% 96% 93%  Weight:      Height:          Layla Maw 10/25/19 [place], [place], [place] 10/26/19 [phone]

## 2019-10-25 NOTE — ED Notes (Signed)
Based on hallucination involving a plate of asparagus, when walking back into room.CIWA went from 17 to 21. Notified ED pharmd. Twanna Hy, pharmd witnessed me give 4mg  rather than 3 mg that I put in pyxis I needed. See note in [facility].

## 2019-10-25 NOTE — ED Notes (Signed)
Patient transported to CT 

## 2019-10-25 NOTE — ED Provider Notes (Addendum)
[facility] EMERGENCY DEPARTMENT Provider Note   CSN: [id number] Arrival date & time: 10/24/19  1403     History Chief Complaint  Patient presents with  . Shortness of Breath    Fernando [name] is a 64 y.o. male.  Pt with hx htn, etoh abuse, c/o general weakness and sob in the past few days. Symptoms gradual onset, moderate, constant, persistent. Saw pcp yesterday, and was noted to have low plt count, and pulse ox 89% and sent to ED for evaluation. Patient states drinks etoh daily, and when stops drinking, feels very shaky, as he feels right now (pt with prolonged wait in ED waiting room due to boarding). Pt denies hx dts or seizures. Indicates 6 weeks ago in hospital in [place] w pna. No current cough or fevers. Denies chest pain or discomfort. No leg pain or increased swelling. No hx dvt or pe.   The history is provided by the patient.  Shortness of Breath Associated symptoms: no abdominal pain, no chest pain, no cough, no fever, no headaches, no neck pain, no rash, no sore throat and no vomiting        Past Medical History:  Diagnosis Date  . Arthritis   . Cataract   . History of kidney stones   . Hyperlipidemia   . Hypertension   . Pneumonia     Patient Active Problem List   Diagnosis Date Noted  . Stasis dermatitis of both legs 09/04/2018  . Medication monitoring encounter 08/09/2018  . 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 Rotator cuff tear arthropathy, right 06/21/2018    Past Surgical History:  Procedure Laterality Date  . COLONOSCOPY    . HERNIA REPAIR  1990  . KNEE SURGERY  1975   left men. repair  . REVERSE SHOULDER ARTHROPLASTY Right 06/21/2018   Procedure: REVERSE SHOULDER ARTHROPLASTY;  Surgeon: Hiram Gash, MD;  Location: [facility];  Service: Orthopedics;  Laterality: Right;  . ROTATOR CUFF REPAIR     right  . TONSILLECTOMY    . UMBILICAL HERNIA REPAIR         Family History  Problem Relation Age of Onset    . Lung cancer Father   . Heart failure Mother   . Colon cancer Neg Hx   . Esophageal cancer Neg Hx   . Stomach cancer Neg Hx   . Liver cancer Neg Hx   . Pancreatic cancer Neg Hx   . Rectal cancer Neg Hx     Social History   Tobacco Use  . Smoking status: Current Every Day Smoker    Packs/day: 0.50    Types: Cigarettes  . Smokeless tobacco: Never Used  Vaping Use  . Vaping Use: Never used  Substance Use Topics  . Alcohol use: Yes    Alcohol/week: 13.0 - 20.0 standard drinks    Types: 6 Cans of beer, 7 - 14 Standard drinks or equivalent per week  . Drug use: No    Home Medications Prior to Admission medications   Medication Sig Start Date End Date Taking? Authorizing Provider  allopurinol (ZYLOPRIM) 100 MG tablet Take 200 mg by mouth daily.  08/09/18   [provider]  amLODipine (NORVASC) 5 MG tablet Take 5 mg by mouth daily.    [provider]  betamethasone dipropionate (DIPROLENE) 0.05 % cream Apply topically 2 (two) times daily. Patient not taking: Reported on 10/25/2019 09/04/18   Sparks Callas, NP  hydrOXYzine (ATARAX/VISTARIL) 10 MG tablet  Take 1 tablet (10 mg total) by mouth 3 (three) times daily as needed for itching. Patient not taking: Reported on 09/04/2018 08/21/18   Pilger Callas, NP  indomethacin (INDOCIN) 50 MG capsule  08/09/18   [provider]  Multiple Vitamin (MULTIVITAMIN) tablet Take 1 tablet by mouth once a week.    [provider]  telmisartan (MICARDIS) 80 MG tablet Take 80 mg by mouth daily.     [provider]    Allergies    Patient has no known allergies.  Review of Systems   Review of Systems  Constitutional: Negative for fever.  HENT: Negative for sore throat.   Eyes: Negative for redness.  Respiratory: Positive for shortness of breath. Negative for cough.   Cardiovascular: Negative for chest pain and palpitations.  Gastrointestinal: Negative for abdominal pain, diarrhea and vomiting.   Endocrine: Negative for polyuria.  Genitourinary: Negative for dysuria and flank pain.  Musculoskeletal: Negative for back pain and neck pain.  Skin: Negative for rash.  Neurological: Negative for headaches.  Hematological: Does not bruise/bleed easily.  Psychiatric/Behavioral: Negative for confusion.    Physical Exam Updated Vital Signs BP 128/80   Pulse (!) 124   Temp 97.9 F (36.6 C)   Resp 16   Ht 1.702 m (5\' 7" )   Wt 74.8 kg   SpO2 97%   BMI 25.84 kg/m   Physical Exam Vitals and nursing note reviewed.  Constitutional:      General: He is not in acute distress.    Appearance: He is well-developed.     Comments: Pt very shaky, tremulous.   HENT:     Head: Atraumatic.     Nose: Nose normal.     Mouth/Throat:     Mouth: Mucous membranes are moist.     Pharynx: Oropharynx is clear.  Eyes:     General: No scleral icterus.    Conjunctiva/sclera: Conjunctivae normal.     Pupils: Pupils are equal, round, and reactive to light.  Neck:     Trachea: No tracheal deviation.  Cardiovascular:     Rate and Rhythm: Normal rate and regular rhythm.     Pulses: Normal pulses.     Heart sounds: Normal heart sounds. No murmur heard.  No friction rub. No gallop.   Pulmonary:     Effort: Pulmonary effort is normal. No accessory muscle usage or respiratory distress.     Breath sounds: Normal breath sounds.  Abdominal:     General: Bowel sounds are normal. There is no distension.     Palpations: Abdomen is soft.     Tenderness: There is no abdominal tenderness. There is no guarding.  Genitourinary:    Comments: No cva tenderness. Musculoskeletal:        General: No swelling. Normal range of motion.     Cervical back: Normal range of motion and neck supple. No rigidity.  Skin:    General: Skin is warm and dry.     Findings: No rash.  Neurological:     Mental Status: He is alert and oriented to person, place, and time.     Comments: Alert, speech clear. Unsteady on feet, very  weak/shaky. Motor intact bil, stre 5/5. Sens grossly intact bil.   Psychiatric:        Mood and Affect: Mood normal.     ED Results / Procedures / Treatments   Labs (all labs ordered are listed, but only abnormal results are displayed) Results for orders placed or performed  during the hospital encounter of 10/24/19  SARS Coronavirus 2 by RT PCR (hospital order, performed in [place] hospital lab) Nasopharyngeal Nasopharyngeal Swab   Specimen: Nasopharyngeal Swab  Result Value Ref Range   SARS Coronavirus 2 NEGATIVE NEGATIVE  Basic metabolic panel  Result Value Ref Range   Sodium 136 135 - 145 mmol/L   Potassium 3.8 3.5 - 5.1 mmol/L   Chloride 99 98 - 111 mmol/L   CO2 21 (L) 22 - 32 mmol/L   Glucose, Bld 102 (H) 70 - 99 mg/dL   BUN 7 (L) 8 - 23 mg/dL   Creatinine, Ser 0.73 0.61 - 1.24 mg/dL   Calcium 8.9 8.9 - 10.3 mg/dL   GFR calc non Af Amer >60 >60 mL/min   GFR calc Af Amer >60 >60 mL/min   Anion gap 16 (H) 5 - 15  CBC  Result Value Ref Range   WBC 4.8 4.0 - 10.5 K/uL   RBC 3.29 (L) 4.22 - 5.81 MIL/uL   Hemoglobin 13.2 13.0 - 17.0 g/dL   HCT 38.4 (L) 39 - 52 %   MCV 116.7 (H) 80.0 - 100.0 fL   MCH 40.1 (H) 26.0 - 34.0 pg   MCHC 34.4 30.0 - 36.0 g/dL   RDW 13.2 11.5 - 15.5 %   Platelets 62 (L) 150 - 400 K/uL   nRBC 0.0 0.0 - 0.2 %  Hepatic function panel  Result Value Ref Range   Total Protein 7.8 6.5 - 8.1 g/dL   Albumin 3.8 3.5 - 5.0 g/dL   AST 152 (H) 15 - 41 U/L   ALT 87 (H) 0 - 44 U/L   Alkaline Phosphatase 151 (H) 38 - 126 U/L   Total Bilirubin 2.7 (H) 0.3 - 1.2 mg/dL   Bilirubin, Direct 0.9 (H) 0.0 - 0.2 mg/dL   Indirect Bilirubin 1.8 (H) 0.3 - 0.9 mg/dL  Protime-INR  Result Value Ref Range   Prothrombin Time 12.0 11.4 - 15.2 seconds   INR 0.9 0.8 - 1.2  Ammonia  Result Value Ref Range   Ammonia 48 (H) 9 - 35 umol/L  Urinalysis, Routine w reflex microscopic  Result Value Ref Range   Color, Urine AMBER (A) YELLOW   APPearance CLEAR CLEAR    Specific Gravity, Urine 1.017 1.005 - 1.030   pH 6.0 5.0 - 8.0   Glucose, UA NEGATIVE NEGATIVE mg/dL   Hgb urine dipstick SMALL (A) NEGATIVE   Bilirubin Urine NEGATIVE NEGATIVE   Ketones, ur 5 (A) NEGATIVE mg/dL   Protein, ur 100 (A) NEGATIVE mg/dL   Nitrite NEGATIVE NEGATIVE   Leukocytes,Ua NEGATIVE NEGATIVE   RBC / HPF 0-5 0 - 5 RBC/hpf   WBC, UA 0-5 0 - 5 WBC/hpf   Bacteria, UA RARE (A) NONE SEEN   Squamous Epithelial / LPF 0-5 0 - 5   Mucus PRESENT    Hyaline Casts, UA PRESENT   Rapid urine drug screen (hospital performed)  Result Value Ref Range   Opiates NONE DETECTED NONE DETECTED   Cocaine NONE DETECTED NONE DETECTED   Benzodiazepines NONE DETECTED NONE DETECTED   Amphetamines NONE DETECTED NONE DETECTED   Tetrahydrocannabinol NONE DETECTED NONE DETECTED   Barbiturates NONE DETECTED NONE DETECTED  CBG monitoring, ED  Result Value Ref Range   Glucose-Capillary 155 (H) 70 - 99 mg/dL  Troponin I (High Sensitivity)  Result Value Ref Range   Troponin I (High Sensitivity) 18 (H) <18 ng/L  Troponin I (High Sensitivity)  Result Value Ref Range  Troponin I (High Sensitivity) 18 (H) <18 ng/L   DG Chest 2 View  Result Date: 10/24/2019 CLINICAL DATA:  Shortness of breath EXAM: CHEST - 2 VIEW COMPARISON:  Radiograph 08/05/2008, CT 07/27/2008 FINDINGS: There are diffusely coarsened interstitial changes and areas of reticulation in the lung bases which may reflect areas of scarring from prior infection or inflammation seen on comparison exams. No focal consolidative opacity. No pneumothorax or effusion. The aorta is calcified. The remaining cardiomediastinal contours are unremarkable. No acute osseous or soft tissue abnormality. Degenerative changes are present in the imaged spine and left shoulder with reverse right shoulder arthroplasty. IMPRESSION: Coarsened interstitial changes and areas of reticulation in the lung bases may reflect areas of scarring from prior infection or  inflammation. No acute cardiopulmonary abnormality. Electronically Signed   By: Lovena Le M.D.   On: 10/24/2019 16:14  ]  EKG EKG Interpretation  Date/Time:  Wednesday October 24 2019 15:22:29 EDT Ventricular Rate:  101 PR Interval:  144 QRS Duration: 152 QT Interval:  412 QTC Calculation: 534 R Axis:   116 Text Interpretation: Sinus tachycardia Right bundle branch block Nonspecific T wave abnormality Confirmed by Lajean Saver [phone]) on 10/25/2019 7:53:43 AM   Radiology DG Chest 2 View  Result Date: 10/24/2019 CLINICAL DATA:  Shortness of breath EXAM: CHEST - 2 VIEW COMPARISON:  Radiograph 08/05/2008, CT 07/27/2008 FINDINGS: There are diffusely coarsened interstitial changes and areas of reticulation in the lung bases which may reflect areas of scarring from prior infection or inflammation seen on comparison exams. No focal consolidative opacity. No pneumothorax or effusion. The aorta is calcified. The remaining cardiomediastinal contours are unremarkable. No acute osseous or soft tissue abnormality. Degenerative changes are present in the imaged spine and left shoulder with reverse right shoulder arthroplasty. IMPRESSION: Coarsened interstitial changes and areas of reticulation in the lung bases may reflect areas of scarring from prior infection or inflammation. No acute cardiopulmonary abnormality. Electronically Signed   By: Lovena Le M.D.   On: 10/24/2019 16:14   CT Angio Chest PE W and/or Wo Contrast  Result Date: 10/25/2019 CLINICAL DATA:  PE suspected EXAM: CT ANGIOGRAPHY CHEST WITH CONTRAST TECHNIQUE: Multidetector CT imaging of the chest was performed using the standard protocol during bolus administration of intravenous contrast. Multiplanar CT image reconstructions and MIPs were obtained to evaluate the vascular anatomy. CONTRAST:  83mL OMNIPAQUE IOHEXOL 350 MG/ML SOLN COMPARISON:  None. FINDINGS: Cardiovascular: Satisfactory opacification of the pulmonary arteries to the  segmental level. No evidence of pulmonary embolism. Cardiomegaly. Three-vessel coronary artery calcifications. No pericardial effusion. Mediastinum/Nodes: No enlarged mediastinal, hilar, or axillary lymph nodes. Thyroid gland, trachea, and esophagus demonstrate no significant findings. Lungs/Pleura: Examination of the lung parenchyma is somewhat limited by breath motion artifact. Within this limitation, there is diffuse bilateral bronchial wall thickening and interlobular septal thickening. There is dependent bibasilar partial atelectasis and/or fibrotic change. Mosaic attenuation of the airspaces throughout. No pleural effusion or pneumothorax. Upper Abdomen: No acute abnormality. Musculoskeletal: No chest wall abnormality. No acute or significant osseous findings. Review of the MIP images confirms the above findings. IMPRESSION: 1. Negative examination for pulmonary embolism. 2. Diffuse bilateral bronchial wall thickening and interlobular septal thickening, consistent with mild edema. 3. Mosaic attenuation of the airspaces throughout, suggestive of small airways disease. 4. Suspected there may be a component of underlying fibrotic interstitial lung disease. Consider nonemergent ILD protocol CT to further evaluate at the resolution of acute clinical symptoms. 5. Cardiomegaly and coronary artery disease. Electronically Signed  By: Eddie Candle M.D.   On: 10/25/2019 11:48    Procedures Procedures (including critical care time)  Medications Ordered in ED Medications  sodium chloride flush (NS) 0.9 % injection 3 mL (has no administration in time range)  LORazepam (ATIVAN) injection 1 mg (has no administration in time range)    ED Course  I have reviewed the triage vital signs and the nursing notes.  Pertinent labs & imaging results that were available during my care of the patient were reviewed by me and considered in my medical decision making (see chart for details).    MDM Rules/Calculators/A&P                           Labs sent from triage.  Once moved to treatment room, pt appears weak/very shaky/tremulous, which pt indicates is due to etoh/etoh withdrawal. Ativan 1 mg iv.   MDM Number of Diagnoses or Management Options   Amount and/or Complexity of Data Reviewed Clinical lab tests: ordered and reviewed Tests in the radiology section of CPT: ordered and reviewed Tests in the medicine section of CPT: ordered and reviewed Discussion of test results with the performing providers: yes Decide to obtain previous medical records or to obtain history from someone other than the patient: yes Obtain history from someone other than the patient: yes Review and summarize past medical records: yes Discuss the patient with other providers: yes Independent visualization of images, tracings, or specimens: yes  Risk of Complications, Morbidity, and/or Mortality Presenting problems: high Diagnostic procedures: high Management options: high   Reviewed nursing notes and prior charts for additional history.  Recent pcp outpatient visit reviewed, plt count low. Pt denies any abnormal bruising or bleeding.   Initial labs reviewed/interpreted by me - plt count is low. Some of lfts elevated, hx chr etoh abuse - similar elevations noted in old records. Pt denies abd pain. No fever/chills. abd soft nt.   CXR reviewed/interpreted by me - no pna.  Additional labs reviewed/interpreted by me - ammonia level elevated. Initial and delta trop both 18, pt denies chest pain.   Pt is hypoxic on room air, sats 87% - 2 liters o2 [place]. covid swab sent - negative for covid.  LUNDEN MCLEISH was evaluated in Emergency Department on 10/25/2019 for the symptoms described in the history of present illness. He was evaluated in the context of the global COVID-19 pandemic, which necessitated consideration that the patient might be at risk for infection with the SARS-CoV-2 virus that causes COVID-19. Institutional protocols  and algorithms that pertain to the evaluation of patients at risk for COVID-19 are in a state of rapid change based on information released by regulatory bodies including the CDC and federal and state organizations. These policies and algorithms were followed during the patient's care in the ED.  CT reviewed/interpreted by me - no PE.  Patient placed on CIWA protocol re etoh withdrawal (tachy, shaky/tremulous).   Banana bag with thiamine iv.   Hospitalists consulted for admission. Discussed pt - will admit.   Recheck pt - mild-mod wheezing bil, pt with long smoking hx, states 'just quit'. Will given albuterol/atrovent neb.   CRITICAL CARE RE: acute/sev alcohol withdrawal requiring ciwa protocol/iv bzd therapy, weakness, hypoxia, thrombocytopenia, hyperaammonemia,  Performed by: Mirna Mires Total critical care time: 35 minutes Critical care time was exclusive of separately billable procedures and treating other patients. Critical care was necessary to treat or prevent imminent or life-threatening deterioration.  Critical care was time spent personally by me on the following activities: development of treatment plan with patient and/or surrogate as well as nursing, discussions with consultants, evaluation of patient's response to treatment, examination of patient, obtaining history from patient or surrogate, ordering and performing treatments and interventions, ordering and review of laboratory studies, ordering and review of radiographic studies, pulse oximetry and re-evaluation of patient's condition.    Final Clinical Impression(s) / ED Diagnoses Final diagnoses:  None    Rx / DC Orders ED Discharge Orders    None          Lajean Saver, MD 10/25/19 1314

## 2019-10-25 NOTE — ED Notes (Signed)
MD Chotiner called RN back and states to place a verbal order for ativan 2mg  IV , RN has placed order and will medicate per [facility]

## 2019-10-25 NOTE — ED Notes (Signed)
Called CT to let them know pt is ready for scan

## 2019-10-25 NOTE — ED Notes (Addendum)
RN has paged MD Chotiner on call , r/t pt increased HR. Awaiting call back , RN will continue to monitor

## 2019-10-25 NOTE — ED Notes (Signed)
Per CN retime enema

## 2019-10-25 NOTE — ED Notes (Signed)
This RN attempted report, charge RN states floor is understaffed at this time and she will contact pt placement and call this RN back " in 10 minutes or so ". RN will continue to monitor

## 2019-10-25 NOTE — H&P (Signed)
History and Physical    Fernando [name] QQV:[id number] DOB: 12/09/1955 DOA: 10/24/2019  PCP: [name], MD  Patient coming from: Home I have personally briefly reviewed patient's old medical records in [place]  Chief Complaint: Generalized weakness  HPI: Fernando [name] is a 64 y.o. male with medical history significant of alcohol abuse, tobacco abuse, hypertension, gout, arthritis, anxiety presents to emergency department due to generalized weakness since 5 days.  Patient tells me that he has been drinking half gallon of vodka every day since couple of years and has quit 5 days ago and since then he has been feeling weak, unable to walk due to difficulty in breathing, lethargic, decreased appetite.  He tells me that he also quit smoking 5 days ago.  He does not use any illicit drugs.  He lives alone at home.  He denies depressed mood or suicidal or homicidal thoughts.  Reports shortness of breath especially with exertion, denies association with leg swelling, orthopnea, PND, cough, congestion, fever, chills, wheezing, nausea, vomiting, abdominal pain or distention.  He had not had bowel movement since Monday due to decreased appetite.  Denies headache, blurry vision, lightheadedness, dizziness, numbness tingling sensation in hands or feet, urinary symptoms, sleep or weight changes.   ED Course: Upon arrival to ED: Patient tachycardic, afebrile, requiring 2 L of oxygen via nasal cannula, no leukocytosis, CBC shows macrocytic anemia, platelet: 62 CMP shows elevated liver enzymes AST: 152, ALT: 87, alkaline phosphatase 151, total bilirubin: 2.7, ammonia level: 48, troponin 18x2, UA positive for bacteria, UDS negative, COVID-19 negative, CT angio chest was obtained which came back negative for PE.  Shows diffuse bilateral bronchial wall thickening and interlobular septal thickening consistent with mild edema.  Suspected there may be a component of underlying fibrotic interstitial lung  disease.  patient received IV fluid, thiamine, started on CIWA protocol in the ED. [facility] hospitalist consulted for admission for alcohol withdrawal and acute hypoxemic respiratory failure.  Review of Systems: As per HPI otherwise negative.    Past Medical History:  Diagnosis Date  . Arthritis   . Cataract   . History of kidney stones   . Hyperlipidemia   . Hypertension   . Pneumonia     Past Surgical History:  Procedure Laterality Date  . COLONOSCOPY    . HERNIA REPAIR  1990  . KNEE SURGERY  1975   left men. repair  . REVERSE SHOULDER ARTHROPLASTY Right 06/21/2018   Procedure: REVERSE SHOULDER ARTHROPLASTY;  Surgeon: Hiram Gash, MD;  Location: [facility];  Service: Orthopedics;  Laterality: Right;  . ROTATOR CUFF REPAIR     right  . TONSILLECTOMY    . UMBILICAL HERNIA REPAIR       reports that he has been smoking cigarettes. He has been smoking about 0.50 packs per day. He has never used smokeless tobacco. He reports current alcohol use of about 13.0 - 20.0 standard drinks of alcohol per week. He reports that he does not use drugs.  No Known Allergies  Family History  Problem Relation Age of Onset  . Lung cancer Father   . Heart failure Mother   . Colon cancer Neg Hx   . Esophageal cancer Neg Hx   . Stomach cancer Neg Hx   . Liver cancer Neg Hx   . Pancreatic cancer Neg Hx   . Rectal cancer Neg Hx     Prior to Admission medications   Medication Sig Start Date End Date Taking? Authorizing Provider  allopurinol (ZYLOPRIM) 100 MG tablet Take 200 mg by mouth daily.  08/09/18   [provider]  amLODipine (NORVASC) 5 MG tablet Take 5 mg by mouth daily.    [provider]  betamethasone dipropionate (DIPROLENE) 0.05 % cream Apply topically 2 (two) times daily. Patient not taking: Reported on 10/25/2019 09/04/18   Morgan Callas, NP  hydrOXYzine (ATARAX/VISTARIL) 10 MG tablet Take 1 tablet (10 mg total) by mouth 3 (three) times daily as needed for  itching. Patient not taking: Reported on 09/04/2018 08/21/18   Aquilla Callas, NP  indomethacin (INDOCIN) 50 MG capsule  08/09/18   [provider]  Multiple Vitamin (MULTIVITAMIN) tablet Take 1 tablet by mouth once a week.    [provider]  telmisartan (MICARDIS) 80 MG tablet Take 80 mg by mouth daily.     [provider]    Physical Exam: Vitals:   10/25/19 0945 10/25/19 1000 10/25/19 1015 10/25/19 1030  BP: 104/72 107/72 115/75 (!) 118/89  Pulse: (!) 116 (!) 110 (!) 118 (!) 124  Resp:      Temp:      TempSrc:      SpO2: 92% 94% 94% 94%  Weight:      Height:        Constitutional: NAD, calm, comfortable, on 2 L of oxygen via nasal cannula, communicating well, following commands, alert and oriented x3 Eyes: PERRL, lids and conjunctivae: Icteric ENMT: Mucous membranes are moist. Posterior pharynx clear of any exudate or lesions.Normal dentition.  Neck: normal, supple, no masses, no thyromegaly Respiratory: Bilateral coarse breath sounds noted.  No wheezing. Cardiovascular: Regular rate and rhythm, no murmurs / rubs / gallops. No extremity edema. 2+ pedal pulses. No carotid bruits.  Abdomen: Distended, soft, no tenderness, no masses palpated. No hepatosplenomegaly. Bowel sounds positive.  Musculoskeletal: no clubbing / cyanosis. No joint deformity upper and lower extremities. Good ROM, no contractures. Normal muscle tone.  Skin: Multiple bruises noted in bilateral upper extremities.   Neurologic: CN 2-12 grossly intact. Sensation intact, DTR normal. Strength 5/5 in all 4.  Psychiatric: Normal judgment and insight. Alert and oriented x 3. Normal mood.    Labs on Admission: I have personally reviewed following labs and imaging studies  CBC: Recent Labs  Lab 10/24/19 1530  WBC 4.8  HGB 13.2  HCT 38.4*  MCV 116.7*  PLT 62*   Basic Metabolic Panel: Recent Labs  Lab 10/24/19 1530  NA 136  K 3.8  CL 99  CO2 21*  GLUCOSE 102*  BUN 7*   CREATININE 0.73  CALCIUM 8.9   GFR: Estimated Creatinine Clearance: 84.9 mL/min (by C-G formula based on SCr of 0.73 mg/dL). Liver Function Tests: Recent Labs  Lab 10/25/19 0811  AST 152*  ALT 87*  ALKPHOS 151*  BILITOT 2.7*  PROT 7.8  ALBUMIN 3.8   No results for input(s): LIPASE, AMYLASE in the last 168 hours. Recent Labs  Lab 10/25/19 0811  AMMONIA 48*   Coagulation Profile: Recent Labs  Lab 10/25/19 0811  INR 0.9   Cardiac Enzymes: No results for input(s): CKTOTAL, CKMB, CKMBINDEX, TROPONINI in the last 168 hours. BNP (last 3 results) No results for input(s): PROBNP in the last 8760 hours. HbA1C: No results for input(s): HGBA1C in the last 72 hours. CBG: Recent Labs  Lab 10/25/19 0712  GLUCAP 155*   Lipid Profile: No results for input(s): CHOL, HDL, LDLCALC, TRIG, CHOLHDL, LDLDIRECT in the last 72 hours. Thyroid Function Tests: No results for  input(s): TSH, T4TOTAL, FREET4, T3FREE, THYROIDAB in the last 72 hours. Anemia Panel: No results for input(s): VITAMINB12, FOLATE, FERRITIN, TIBC, IRON, RETICCTPCT in the last 72 hours. Urine analysis:    Component Value Date/Time   COLORURINE AMBER (A) 10/25/2019 0909   APPEARANCEUR CLEAR 10/25/2019 0909   LABSPEC 1.017 10/25/2019 0909   PHURINE 6.0 10/25/2019 0909   GLUCOSEU NEGATIVE 10/25/2019 0909   HGBUR SMALL (A) 10/25/2019 0909   BILIRUBINUR NEGATIVE 10/25/2019 0909   KETONESUR 5 (A) 10/25/2019 0909   PROTEINUR 100 (A) 10/25/2019 0909   UROBILINOGEN 1.0 07/21/2008 0647   NITRITE NEGATIVE 10/25/2019 0909   LEUKOCYTESUR NEGATIVE 10/25/2019 0909    Radiological Exams on Admission: DG Chest 2 View  Result Date: 10/24/2019 CLINICAL DATA:  Shortness of breath EXAM: CHEST - 2 VIEW COMPARISON:  Radiograph 08/05/2008, CT 07/27/2008 FINDINGS: There are diffusely coarsened interstitial changes and areas of reticulation in the lung bases which may reflect areas of scarring from prior infection or inflammation  seen on comparison exams. No focal consolidative opacity. No pneumothorax or effusion. The aorta is calcified. The remaining cardiomediastinal contours are unremarkable. No acute osseous or soft tissue abnormality. Degenerative changes are present in the imaged spine and left shoulder with reverse right shoulder arthroplasty. IMPRESSION: Coarsened interstitial changes and areas of reticulation in the lung bases may reflect areas of scarring from prior infection or inflammation. No acute cardiopulmonary abnormality. Electronically Signed   By: Lovena Le M.D.   On: 10/24/2019 16:14   CT Angio Chest PE W and/or Wo Contrast  Result Date: 10/25/2019 CLINICAL DATA:  PE suspected EXAM: CT ANGIOGRAPHY CHEST WITH CONTRAST TECHNIQUE: Multidetector CT imaging of the chest was performed using the standard protocol during bolus administration of intravenous contrast. Multiplanar CT image reconstructions and MIPs were obtained to evaluate the vascular anatomy. CONTRAST:  22mL OMNIPAQUE IOHEXOL 350 MG/ML SOLN COMPARISON:  None. FINDINGS: Cardiovascular: Satisfactory opacification of the pulmonary arteries to the segmental level. No evidence of pulmonary embolism. Cardiomegaly. Three-vessel coronary artery calcifications. No pericardial effusion. Mediastinum/Nodes: No enlarged mediastinal, hilar, or axillary lymph nodes. Thyroid gland, trachea, and esophagus demonstrate no significant findings. Lungs/Pleura: Examination of the lung parenchyma is somewhat limited by breath motion artifact. Within this limitation, there is diffuse bilateral bronchial wall thickening and interlobular septal thickening. There is dependent bibasilar partial atelectasis and/or fibrotic change. Mosaic attenuation of the airspaces throughout. No pleural effusion or pneumothorax. Upper Abdomen: No acute abnormality. Musculoskeletal: No chest wall abnormality. No acute or significant osseous findings. Review of the MIP images confirms the above  findings. IMPRESSION: 1. Negative examination for pulmonary embolism. 2. Diffuse bilateral bronchial wall thickening and interlobular septal thickening, consistent with mild edema. 3. Mosaic attenuation of the airspaces throughout, suggestive of small airways disease. 4. Suspected there may be a component of underlying fibrotic interstitial lung disease. Consider nonemergent ILD protocol CT to further evaluate at the resolution of acute clinical symptoms. 5. Cardiomegaly and coronary artery disease. Electronically Signed   By: Eddie Candle M.D.   On: 10/25/2019 11:48    EKG: Shows sinus tachycardia, right bundle branch block.  Nonspecific T wave changes.  No ST elevation or depression noted.  Assessment/Plan Principal Problem:   Acute hypoxemic respiratory failure (HCC) Active Problems:   Generalized weakness   Alcoholic hepatitis   Sinus tachycardia   Hyperammonemia (HCC)   Alcohol withdrawal (HCC)   Macrocytic anemia   Thrombocytopenia (HCC)   Hypertension   Gout   Tobacco abuse    Acute  hypoxemic respiratory failure: -secondary to COPD/ILD vs pulmonary edema -Patient currently requiring 2 L of oxygen via nasal cannula-reviewed chest x-ray and CT angio of chest-no PE, suspected underlying fibrotic interstitial lung disease, mild edema, small airway disease, cardiomegaly and coronary artery disease. -Admit patient to stepdown unit for close monitoring.  On continuous pulse ox.  We will try to wean off of oxygen as tolerated.  Albuterol as needed. -Reviewed EKG.  Troponin: 18x2.  Check BNP - will give Lasix 40 IV once. -Start on Solu-Medrol. -Incentive spirometry  Alcohol withdrawal: Patient is alert and oriented and communicating well.  He does have tremors on exam.  He is tachycardic. -Received banana bag.  Started on CIWA protocol. -Neuro checks every 4 hours. -On seizure/fall precautions -Monitor vitals closely.  Check magnesium level -Continue folic acid thiamine and  multivitamin.  Alcohol hepatitis: -Elevated liver enzymes.  Elevated total bilirubin -Check PT/INR, acute hepatitis panel -Monitor liver function closely. -We will get right upper quadrant ultrasound  Hyperammonemia: Ammonia level: 48 -Patient had not had bowel movement since Monday.  No nausea vomiting or abdominal pain -We will give lactulose per rectal.  Repeat ammonia level tomorrow a.m.  Hypertension: Patient's blood pressure is on lower side.  104/72.  We will hold telmisartan for now.  Resume telmisartan once blood pressure is back to baseline.  Asymptomatic bacteriuria: Patient denies urinary symptoms -We will hold off antibiotics for now  Macrocytic anemia: -Likely secondary to alcohol abuse -Check B12, TSH, folate level -Monitor H&H closely  Thrombocytopenia: Platelets 62 -No active bleeding.  Repeat CBC tomorrow a.m.  Tobacco abuse: Patient tells me that he has quit smoking 5 days ago.  Refused nicotine patch.  Gout: Continue allopurinol  DVT prophylaxis: SCD/no chemical anticoagulation due to thrombocytopenia Code Status: Full code-confirmed with the patient Family Communication: None present at bedside.  Plan of care discussed with patient in length and he verbalized understanding and agreed with it. Disposition Plan: Home in 2 to 3 days Consults called: None*  Admission status: Inpatient   Mckinley Jewel MD [facility]  If 7PM-7AM, please contact night-coverage [email or web address] Password [facility]  10/25/2019, 12:53 PM

## 2019-10-25 NOTE — ED Notes (Signed)
Called RT to ask to administer nebs.

## 2019-10-26 ENCOUNTER — Other Ambulatory Visit: Payer: Self-pay

## 2019-10-26 DIAGNOSIS — J9601 Acute respiratory failure with hypoxia: Secondary | ICD-10-CM

## 2019-10-26 DIAGNOSIS — F10231 Alcohol dependence with withdrawal delirium: Secondary | ICD-10-CM

## 2019-10-26 DIAGNOSIS — R531 Weakness: Secondary | ICD-10-CM

## 2019-10-26 LAB — COMPREHENSIVE METABOLIC PANEL
ALT: 95 U/L — ABNORMAL HIGH (ref 0–44)
AST: 176 U/L — ABNORMAL HIGH (ref 15–41)
Albumin: 3.5 g/dL (ref 3.5–5.0)
Alkaline Phosphatase: 145 U/L — ABNORMAL HIGH (ref 38–126)
Anion gap: 17 — ABNORMAL HIGH (ref 5–15)
BUN: 12 mg/dL (ref 8–23)
CO2: 20 mmol/L — ABNORMAL LOW (ref 22–32)
Calcium: 9.1 mg/dL (ref 8.9–10.3)
Chloride: 99 mmol/L (ref 98–111)
Creatinine, Ser: 0.99 mg/dL (ref 0.61–1.24)
GFR calc Af Amer: >60 mL/min (ref >60)
GFR calc non Af Amer: >60 mL/min (ref >60)
Glucose, Bld: 176 mg/dL — ABNORMAL HIGH (ref 70–99)
Potassium: 3.7 mmol/L (ref 3.5–5.1)
Sodium: 136 mmol/L (ref 135–145)
Total Bilirubin: 4.1 mg/dL — ABNORMAL HIGH (ref 0.3–1.2)
Total Protein: 7.1 g/dL (ref 6.5–8.1)

## 2019-10-26 LAB — CBC
HCT: 40 % (ref 39.0–52.0)
Hemoglobin: 13.8 g/dL (ref 13.0–17.0)
MCH: 41.3 pg — ABNORMAL HIGH (ref 26.0–34.0)
MCHC: 34.5 g/dL (ref 30.0–36.0)
MCV: 119.8 fL — ABNORMAL HIGH (ref 80.0–100.0)
Platelets: 60 10*3/uL — ABNORMAL LOW (ref 150–400)
RBC: 3.34 MIL/uL — ABNORMAL LOW (ref 4.22–5.81)
RDW: 13.1 % (ref 11.5–15.5)
WBC: 4.6 10*3/uL (ref 4.0–10.5)
nRBC: 0 % (ref 0.0–0.2)

## 2019-10-26 LAB — AMMONIA: Ammonia: 35 umol/L (ref 9–35)

## 2019-10-26 LAB — MAGNESIUM: Magnesium: 1.4 mg/dL — ABNORMAL LOW (ref 1.7–2.4)

## 2019-10-26 LAB — PHOSPHORUS: Phosphorus: 3.3 mg/dL (ref 2.5–4.6)

## 2019-10-26 LAB — GLUCOSE, CAPILLARY: Glucose-Capillary: 136 mg/dL — ABNORMAL HIGH (ref 70–99)

## 2019-10-26 MED ORDER — MAGNESIUM SULFATE 2 GM/50ML IV SOLN
2.0000 g | Freq: Once | INTRAVENOUS | Status: AC
  Administered 2019-10-26: 2 g via INTRAVENOUS
  Filled 2019-10-26: qty 50

## 2019-10-26 MED ORDER — ORAL CARE MOUTH RINSE
15.0000 mL | Freq: Two times a day (BID) | OROMUCOSAL | Status: DC
  Administered 2019-10-26 – 2019-11-01 (×12): 15 mL via OROMUCOSAL

## 2019-10-26 MED ORDER — FUROSEMIDE 10 MG/ML IJ SOLN
40.0000 mg | Freq: Once | INTRAMUSCULAR | Status: AC
  Administered 2019-10-26: 40 mg via INTRAVENOUS
  Filled 2019-10-26: qty 4

## 2019-10-26 MED ORDER — CHLORDIAZEPOXIDE HCL 25 MG PO CAPS
25.0000 mg | ORAL_CAPSULE | Freq: Every day | ORAL | Status: AC
  Administered 2019-10-29: 25 mg via ORAL
  Filled 2019-10-26: qty 1

## 2019-10-26 MED ORDER — IPRATROPIUM-ALBUTEROL 0.5-2.5 (3) MG/3ML IN SOLN
3.0000 mL | Freq: Four times a day (QID) | RESPIRATORY_TRACT | Status: DC
  Administered 2019-10-26 – 2019-10-27 (×5): 3 mL via RESPIRATORY_TRACT
  Filled 2019-10-26 (×5): qty 3

## 2019-10-26 MED ORDER — DEXMEDETOMIDINE HCL IN NACL 400 MCG/100ML IV SOLN
0.2000 ug/kg/h | INTRAVENOUS | Status: DC
  Administered 2019-10-26: 0.2 ug/kg/h via INTRAVENOUS
  Administered 2019-10-26: 0.3 ug/kg/h via INTRAVENOUS
  Administered 2019-10-27: 0.4 ug/kg/h via INTRAVENOUS
  Administered 2019-10-27: 0.7 ug/kg/h via INTRAVENOUS
  Administered 2019-10-27: 0.4 ug/kg/h via INTRAVENOUS
  Administered 2019-10-28: 0.7 ug/kg/h via INTRAVENOUS
  Administered 2019-10-28: 0.4 ug/kg/h via INTRAVENOUS
  Administered 2019-10-29: 0.7 ug/kg/h via INTRAVENOUS
  Administered 2019-10-29: 0.3 ug/kg/h via INTRAVENOUS
  Filled 2019-10-26 (×9): qty 100

## 2019-10-26 MED ORDER — CHLORDIAZEPOXIDE HCL 25 MG PO CAPS
25.0000 mg | ORAL_CAPSULE | Freq: Four times a day (QID) | ORAL | Status: AC
  Administered 2019-10-26 (×4): 25 mg via ORAL
  Filled 2019-10-26 (×4): qty 1

## 2019-10-26 MED ORDER — PROSIGHT PO TABS
1.0000 | ORAL_TABLET | Freq: Every day | ORAL | Status: DC
  Administered 2019-10-26 – 2019-11-01 (×7): 1 via ORAL
  Filled 2019-10-26 (×7): qty 1

## 2019-10-26 MED ORDER — CHLORDIAZEPOXIDE HCL 25 MG PO CAPS
25.0000 mg | ORAL_CAPSULE | ORAL | Status: AC
  Administered 2019-10-28 (×2): 25 mg via ORAL
  Filled 2019-10-26 (×2): qty 1

## 2019-10-26 MED ORDER — METHYLPREDNISOLONE SODIUM SUCC 40 MG IJ SOLR
40.0000 mg | Freq: Every day | INTRAMUSCULAR | Status: DC
  Administered 2019-10-26 – 2019-10-28 (×3): 40 mg via INTRAVENOUS
  Filled 2019-10-26 (×4): qty 1

## 2019-10-26 MED ORDER — FOLIC ACID 1 MG PO TABS
1.0000 mg | ORAL_TABLET | Freq: Every day | ORAL | Status: DC
  Administered 2019-10-26 – 2019-11-01 (×7): 1 mg via ORAL
  Filled 2019-10-26 (×7): qty 1

## 2019-10-26 MED ORDER — CHLORHEXIDINE GLUCONATE CLOTH 2 % EX PADS
6.0000 | MEDICATED_PAD | Freq: Every day | CUTANEOUS | Status: DC
  Administered 2019-10-26 – 2019-11-01 (×5): 6 via TOPICAL

## 2019-10-26 MED ORDER — CHLORDIAZEPOXIDE HCL 25 MG PO CAPS
25.0000 mg | ORAL_CAPSULE | Freq: Three times a day (TID) | ORAL | Status: AC
  Administered 2019-10-27 (×3): 25 mg via ORAL
  Filled 2019-10-26 (×3): qty 1

## 2019-10-26 NOTE — Progress Notes (Signed)
PCCM interval progress note:  Pt transferred from ED to ICU in stable condition, calm and oriented and passed swallow screen and is requesting diet.  Will start with liquid and advance as tolerated.     Otilio Carpen Kylina Vultaggio, PA-C

## 2019-10-26 NOTE — ED Notes (Signed)
RN to room, RN has informed charge RN and MD of pt needing more ativan and increased HR at rest. Pthas intermit confusion and is not responding to ativan regimen. At this time MD Page and critical care consult is being placed by provider. RN will continue to monitor

## 2019-10-26 NOTE — ED Notes (Signed)
Spoke to Dr.Chotiner, [facility], concerning reassessment of patient for tachycardia 140-180s, ativan (23 mg since 0915 am 10/25/19), CIWA 15-17. Pt currently on 11L [place] high flow. Able to have Dr.Patel, who was already present in ER, reassess the patient. At present, pt asleep, remains tachycardic. Oxygen turned down to 5L [place], sats 92%. New orders to be placed by Dr. Posey Pronto.

## 2019-10-26 NOTE — Progress Notes (Signed)
Called by RN that pt with increase in HR to 180's. Pt is resting and will wake up to voice. He tries to get up out of bed when he awakens and HR increases. He has been given high Ativan requirement based on CIWA scores for management of his alcohol withdrawal.  He is primarily admitted for acute hypoxic respiratory failure secondary to COPD/ILD.  He has been started on CIWA protocol while awaiting stepdown bed availability.  He has received a total of 26 mg Ativan so far in the last 24 hours.  EKG shows wide complex tachycardia with HR of 181.  Pt probably has high benzodiazepine requirement with his history of alcohol abuse. Continue with CIWA.  Will discuss with CCM as pt may benefit from Precedex infusion to help manage

## 2019-10-26 NOTE — ED Notes (Signed)
Please call Marlyne Beards (sister in law) [phone] for an update

## 2019-10-26 NOTE — Progress Notes (Signed)
I was asked to evaluate patient due to concern for high Ativan requirement based on CIWA scores for management of his alcohol withdrawal.  He is primarily admitted for acute hypoxic respiratory failure secondary to COPD/ILD.  He has been started on CIWA protocol while awaiting stepdown bed availability.  He has received a total of 23 mg Ativan so far in the last 24 hours.  Patient is seen at bedside, initially asleep but awakens easily to voice and light stimulation.  He tells me he normally drinks half a bottle of vodka daily.  He says his last drink was this past Monday.  He reports prior withdrawal from alcohol but denies any history of seizures or need for intubation in the past.  He denies any auditory or visual hallucinations.  He is somewhat tremulous.  He remains tachycardic.  He is currently saturating 96% on HFNC 11 L O2.  Likely has high benzodiazepine requirement given degree of alcohol use.  Recommend continuing with current CIWA protocol and as needed Ativan.  Will add on Librium taper. At this time, I do not think patient requires addition of Precedex infusion for management of alcohol withdrawal however he is at increased risk for worsening withdrawal and would have low threshold for critical care evaluation if he does require Precedex.  Zada Finders, MD [facility]

## 2019-10-26 NOTE — ED Notes (Signed)
Pt found attempting to exit bed. HR 140s, RR 30s, 90% on HFNC 5L. Pt reoriented. After several minutes of rest pt VS returned to baseline. RR 17 SpO2 91% HR 85

## 2019-10-26 NOTE — Progress Notes (Signed)
Brief PCCM Progress Note  Seen in follow up to PCCM consultation 10/26/19 early morning. Patient remains in ED, awaiting ICU bed, and was started on precedex as adjunct for EtOH withdrawal. He was given 40mg  lasix for possible pulmonary edema and continued on tx for suspected AECOPD.   Pt is sleeping comfortably at time of exam.  RRR  Coarse breath sounds with bibasilar crackles  Abd is soft ndnt  Awakens to stimulation but falls asleep quickly. Fine tremor BUE  P  -Will give additional 40mg  Lasix  -Wean O2 as able for SpO2 goal 88-92% -Continue BDs, qD solumedrol -Continue precedex, librium, BVitamins  -Continue ICU monitoring on precedex; awaiting ICU bed  -AM BMP, mag, CXR    Eliseo Gum MSN, AGACNP-BC [place] [phone] If no answer, [phone] 10/26/2019, 1:22 PM

## 2019-10-26 NOTE — Progress Notes (Signed)
Attending:  Seen and examined independently with Kennieth Rad NP, we formulated the plan in her note together.     Subjective: 64 y/o male presented to the ER on 7/22 with a chief complaint of weakness and dyspnea after making an attempt to quit drinking alcohol.  He says that he drinks a significant amount of vodka (told me 1/2 pint, he told the admitting hospitalist 1 gallon of vodka daily).  He was admitted for a COPD exacerbation, given steroids and bronchiodilators and was admitted but never left the ER.  He developed worsening agitation, tachycardia, and tremulousness in the ER and was given multiple pushes of IV ativan per CIWA protocol.  Librium was also started.  PCCM was consulted when the patient had receive 29mg  of ativan and remained tremulous and tachycardic.   Of note was reportedly hospitalized for pneumonia recently for 6 weeks (due to [place]? He says he had COVID in April 2021).   Objective: Vitals:   10/26/19 0134 10/26/19 0232 10/26/19 0238 10/26/19 0239  BP: (!) 117/87 115/83  115/83  Pulse: (!) 141   (!) 140  Resp: (!) 26     Temp:   99.7 F (37.6 C)   TempSrc:   Rectal   SpO2: 93%     Weight:      Height:         No intake or output data in the 24 hours ending 10/26/19 0327  General:  Anxious but otherwise resting comfortably in bed HENT: NCAT OP clear PULM: Crackles lungs bases L base greater than right, normal effort CV: Tachycardia, no mgr GI: BS+, soft, nontender MSK: normal bulk and tone Neuro: awake, alert, oriented to situation but not place, tremor in hands bilaterally, MAEW   CBC    Component Value Date/Time   WBC 4.8 10/25/2019 1240   RBC 3.24 (L) 10/25/2019 1240   HGB 13.4 10/25/2019 1240   HCT 38.8 (L) 10/25/2019 1240   PLT 57 (L) 10/25/2019 1240   MCV 119.8 (H) 10/25/2019 1240   MCH 41.4 (H) 10/25/2019 1240   MCHC 34.5 10/25/2019 1240   RDW 13.1 10/25/2019 1240   LYMPHSABS 0.5 (L) 10/25/2019 1240   MONOABS 0.6 10/25/2019 1240    EOSABS 0.0 10/25/2019 1240   BASOSABS 0.0 10/25/2019 1240    BMET    Component Value Date/Time   NA 138 10/25/2019 1240   K 4.0 10/25/2019 1240   CL 100 10/25/2019 1240   CO2 25 10/25/2019 1240   GLUCOSE 94 10/25/2019 1240   BUN 10 10/25/2019 1240   CREATININE 0.92 10/25/2019 1240   CALCIUM 9.0 10/25/2019 1240   GFRNONAA >60 10/25/2019 1240   GFRAA >60 10/25/2019 1240    CT chest images reviewed: subsegmental atelectasis and interlobular septal thickening in bases, some reticulation upper lobes, particularly left upper lobe, scattered patchy ground glass opacification with air trapping bilaterally.  Impression/Plan: Acute metabolic encephalopathy due to delirium tremens: > admit to ICU > precedex infusion per ICU EtOH withdrawal protocol > continue librium > folate, thiamine  Dyspnea, acute respiratory failure with hypoxemia due to presumed COPD exacerbation, possible acute pulmonary edema: > CXR again in AM, repeat lasix if BP allows > duoneb scheduled > reduce solumedrol to 40mg  daily for now > needs outpatient PFT  Tobacco abuse: > counsel to quit  Abnormal CT chest > some findings could be explained by recent pneumonia, but concern for ILD and emphysema noted > needs outpatient pulmonary follow up > will need repeat HRCT  at somepoint  My cc time 35 minutes  Roselie Awkward, MD [facility] PCCM Pager: [phone] Cell: [phone] After 3pm or if no response, call [phone]

## 2019-10-26 NOTE — ED Notes (Signed)
Friend at bedside now

## 2019-10-26 NOTE — ED Notes (Signed)
EKG captured with pulse of 180, oxygen sats 89-91% on 5L oxygen. Pt restless at times and intermittently lethargic. Dr. Tonie Griffith made aware and CCM consulted for evaluation.

## 2019-10-26 NOTE — Progress Notes (Signed)
eLink Physician-Brief Progress Note Patient Name: Fernando [name] DOB: Mar 15, 1956 MRN: [id number]   Date of Service  10/26/2019  HPI/Events of Note  Patient was originally admitted for acute on chronic hypoxemic respiratory failure secondary to exacerbation of COPD but went into severe DT's in the ED and had to be started on Precedex  When 29 mg of Ativan + Librium failed to interrupt his delirium, he is currently improved.  eICU Interventions  New Patient Evaluation completed.        Fernando [name] 10/26/2019, 11:20 PM

## 2019-10-26 NOTE — ED Notes (Signed)
RN to room , pt noted to be attempting to get out of bed, pt medicated per [facility]

## 2019-10-26 NOTE — Consult Note (Addendum)
NAME:  Fernando [name], MRN:  [id number], DOB:  [birth year], LOS: 1 ADMISSION DATE:  10/24/2019, CONSULTATION DATE:  10/26/2019 REFERRING MD:  Dr. Tonie Griffith, CHIEF COMPLAINT:  ETOH withdrawal  Brief History   64 year old male admitted 7/22 presenting with generalized weakness admitted for alcohol withdrawal, hypoxemia, and elevated liver enzymes.  Patient with continued high CIWA scores despite 29 mg Ativan since admit   History of present illness   HPI obtained from medical chart review and per patient which is limited.   64 year old male with prior history of alcohol abuse (half gallon of vodka daily for several years; no hx of seizures or intubations), tobacco abuse, HTN, anxiety, arthritis, gout, COVID in April, and recent hospitalization in [place] 6 weeks prior for pneumonia who presented 7/22 with generalized weakness for 5 days with difficulty ambulating, decreased appetite, lethargy and difficulty breathing/ exertional shortness of breath.  Quit smoking and last drink of ETOH 5 days prior to admit.   Was afebrile, tachycardic and requiring 2L O2 at that time. Chest CT was negative for a PE but showed diffuse bilateral bronchial wall thickening and interlobular septal thickening, consistent with mild edema.  Given lasix and started on solumedrol.  Also noted to have elevated LFTs and ammonia with ammonia level 48.  RUQ US showed no acute abnormality and started on lactulose rectally.  Admitted to [facility].  Placed on CIWA protocol. Patient with increasing CIWA scores, most recently 15-20 with cumulative total of 29 mg of Ativan ~24 hrs.  PCCM called for possible ICU admit and precedex.    Past Medical History  Alcohol abuse Tobacco abuse HTN Gout Arthritis Anxiety   Significant Hospital Events   7/22 Admit to [facility]/ PCU  Consults:  PCCM  Procedures:   Significant Diagnostic Tests:  7/22 CTA PE >> 1. Negative examination for pulmonary embolism. 2. Diffuse bilateral bronchial wall  thickening and interlobular septal thickening, consistent with mild edema. 3. Mosaic attenuation of the airspaces throughout, suggestive of small airways disease. 4. Suspected there may be a component of underlying fibrotic interstitial lung disease. Consider nonemergent ILD protocol CT to further evaluate at the resolution of acute clinical symptoms. 5. Cardiomegaly and coronary artery disease.  7/22 RUQ [place] >> 1. No acute abnormality. 2. Hepatic steatosis.  Micro Data:  7/22 SARS 2 >> neg  Antimicrobials:   Interim history/subjective:   Objective   Blood pressure 115/83, pulse (!) 140, temperature 99.7 F (37.6 C), temperature source Rectal, resp. rate (!) 26, height 5\' 7"  (1.702 m), weight 74.8 kg, SpO2 93 %.       No intake or output data in the 24 hours ending 10/26/19 0244 Filed Weights   10/24/19 1523 10/24/19 1525  Weight: 78 kg 74.8 kg    Examination: General:  Older male sitting in bed in mild distress Neuro: Awake, confused, tremulous, MAE CV: ST PULM:  tachypneic  Extremities: warm/dry, no edema   Resolved Hospital Problem list    Assessment & Plan:   Acute alcohol withdrawal/ DTs - admit to ICU - serial neuro exams - precedex per ICU CIWA protocol - continue librium taper  - seizure and aspiration precautions  - daily empiric thiamine/ folate   Acute hypoxic respiratory failure  Tobacco abuse CT changes of diffuse bilateral bronchial wall thickening and interlobular septal thickening s/p lasix dose 7/22 - continue supplemental O2 for sat goal > 88% - decrease solumedrol to 40 mg daily - continue BD - pulmonary toilet, IS -  will likely need further pulmonary outpatient followup and workup for possible ILD changes vs post pneumonia changes - CXR this am - hold further diuresis given borderline BP - no role for abx at this time   Prolonged QTc - tele monitoring - avoid QTc prolonging meds  Macrocytic anemia - trend CBC  Thrombocytopenia -  trend CBC  Transaminitis - possible alcoholic hepatitis   Elevated ammonia - pending recheck ammonia level  - recheck LFTs in am   Hypomagnesia - Mag 2gm now, recheck in am    TSH- 7.096 - recheck outpatient  Hx HTN - holding home meds given borderline soft blood pressure - monitor I/Os  Best practice:  Diet: NPO Pain/Anxiety/Delirium protocol (if indicated): as above VAP protocol (if indicated): n/a DVT prophylaxis: heparin SQ/ SCDs GI prophylaxis: n/a Glucose control: trend on BMP Mobility: BR Code Status: Full  Family Communication: pending Disposition: admit ICU  Labs   CBC: Recent Labs  Lab 10/24/19 1530 10/25/19 1240  WBC 4.8 4.8  NEUTROABS  --  3.8  HGB 13.2 13.4  HCT 38.4* 38.8*  MCV 116.7* 119.8*  PLT 62* 57*    Basic Metabolic Panel: Recent Labs  Lab 10/24/19 1530 10/25/19 1240  NA 136 138  K 3.8 4.0  CL 99 100  CO2 21* 25  GLUCOSE 102* 94  BUN 7* 10  CREATININE 0.73 0.92  CALCIUM 8.9 9.0  MG  --  1.6*  PHOS  --  3.3   GFR: Estimated Creatinine Clearance: 73.8 mL/min (by C-G formula based on SCr of 0.92 mg/dL). Recent Labs  Lab 10/24/19 1530 10/25/19 1240  WBC 4.8 4.8    Liver Function Tests: Recent Labs  Lab 10/25/19 0811 10/25/19 1240  AST 152* 117*  ALT 87* 80*  ALKPHOS 151* 139*  BILITOT 2.7* 2.8*  PROT 7.8 7.0  ALBUMIN 3.8 3.7   No results for input(s): LIPASE, AMYLASE in the last 168 hours. Recent Labs  Lab 10/25/19 0811  AMMONIA 48*    ABG    Component Value Date/Time   PHART 7.469 (H) 07/30/2008 0837   PCO2ART 45.6 (H) 07/30/2008 0837   PO2ART 74.0 (L) 07/30/2008 0837   HCO3 33.1 (H) 07/30/2008 0837   TCO2 34 07/30/2008 0837   ACIDBASEDEF 1.0 07/21/2008 0534   O2SAT 95.0 07/30/2008 0837     Coagulation Profile: Recent Labs  Lab 10/25/19 0811 10/25/19 1240  INR 0.9 1.0    Cardiac Enzymes: No results for input(s): CKTOTAL, CKMB, CKMBINDEX, TROPONINI in the last 168 hours.  HbA1C: No results  found for: HGBA1C  CBG: Recent Labs  Lab 10/25/19 0712  GLUCAP 155*    Review of Systems:   Unable   Past Medical History  He,  has a past medical history of Arthritis, Cataract, History of kidney stones, Hyperlipidemia, Hypertension, and Pneumonia.   Surgical History    Past Surgical History:  Procedure Laterality Date  . COLONOSCOPY    . HERNIA REPAIR  1990  . KNEE SURGERY  1975   left men. repair  . REVERSE SHOULDER ARTHROPLASTY Right 06/21/2018   Procedure: REVERSE SHOULDER ARTHROPLASTY;  Surgeon: Hiram Gash, MD;  Location: [facility];  Service: Orthopedics;  Laterality: Right;  . ROTATOR CUFF REPAIR     right  . TONSILLECTOMY    . UMBILICAL HERNIA REPAIR       Social History   reports that he has been smoking cigarettes. He has been smoking about 0.50 packs per day. He has never  used smokeless tobacco. He reports current alcohol use of about 13.0 - 20.0 standard drinks of alcohol per week. He reports that he does not use drugs.   Family History   His family history includes Heart failure in his mother; Lung cancer in his father. There is no history of Colon cancer, Esophageal cancer, Stomach cancer, Liver cancer, Pancreatic cancer, or Rectal cancer.   Allergies No Known Allergies   Home Medications  Prior to Admission medications   Medication Sig Start Date End Date Taking? Authorizing Provider  allopurinol (ZYLOPRIM) 100 MG tablet Take 200 mg by mouth daily.  08/09/18  Yes [provider]  Multiple Vitamin (MULTIVITAMIN) tablet Take 1 tablet by mouth once a week.   Yes [provider]  telmisartan (MICARDIS) 80 MG tablet Take 80 mg by mouth daily.    Yes [provider]  betamethasone dipropionate (DIPROLENE) 0.05 % cream Apply topically 2 (two) times daily. Patient not taking: Reported on 10/25/2019 09/04/18   [place] Callas, NP  hydrOXYzine (ATARAX/VISTARIL) 10 MG tablet Take 1 tablet (10 mg total) by mouth 3 (three) times daily as  needed for itching. Patient not taking: Reported on 09/04/2018 08/21/18   Thayne Callas, NP     Critical care time: 26 mins    Kennieth Rad, MSN, AGACNP-BC [facility] 10/26/2019, 3:39 AM  See Shea Evans for personal pager PCCM on call pager [phone]

## 2019-10-26 NOTE — ED Notes (Signed)
Pt noted to be yelling, attempting to get out of bed. Pt HR increased to 169, RN increased medication and redirected pt

## 2019-10-27 ENCOUNTER — Inpatient Hospital Stay (HOSPITAL_COMMUNITY): Payer: Medicare Other

## 2019-10-27 DIAGNOSIS — I340 Nonrheumatic mitral (valve) insufficiency: Secondary | ICD-10-CM

## 2019-10-27 LAB — ECHOCARDIOGRAM LIMITED
Height: 67 in
Weight: 2546.75 oz

## 2019-10-27 LAB — GLUCOSE, CAPILLARY
Glucose-Capillary: 121 mg/dL — ABNORMAL HIGH (ref 70–99)
Glucose-Capillary: 110 mg/dL — ABNORMAL HIGH (ref 70–99)
Glucose-Capillary: 178 mg/dL — ABNORMAL HIGH (ref 70–99)
Glucose-Capillary: 145 mg/dL — ABNORMAL HIGH (ref 70–99)
Glucose-Capillary: 187 mg/dL — ABNORMAL HIGH (ref 70–99)

## 2019-10-27 LAB — ECHOCARDIOGRAM COMPLETE
Area-P 1/2: 3.72 cm2
Height: 67 in
S' Lateral: 4.2 cm
Weight: 2546.75 oz

## 2019-10-27 LAB — PHOSPHORUS: Phosphorus: 3.1 mg/dL (ref 2.5–4.6)

## 2019-10-27 LAB — MRSA PCR SCREENING: MRSA by PCR: NEGATIVE

## 2019-10-27 LAB — BASIC METABOLIC PANEL
Anion gap: 13 (ref 5–15)
BUN: 24 mg/dL — ABNORMAL HIGH (ref 8–23)
CO2: 24 mmol/L (ref 22–32)
Calcium: 9.1 mg/dL (ref 8.9–10.3)
Chloride: 101 mmol/L (ref 98–111)
Creatinine, Ser: 0.81 mg/dL (ref 0.61–1.24)
GFR calc Af Amer: >60 mL/min (ref >60)
GFR calc non Af Amer: >60 mL/min (ref >60)
Glucose, Bld: 109 mg/dL — ABNORMAL HIGH (ref 70–99)
Potassium: 3.8 mmol/L (ref 3.5–5.1)
Sodium: 138 mmol/L (ref 135–145)

## 2019-10-27 LAB — CBC
HCT: 39 % (ref 39.0–52.0)
Hemoglobin: 13.7 g/dL (ref 13.0–17.0)
MCH: 41.8 pg — ABNORMAL HIGH (ref 26.0–34.0)
MCHC: 35.1 g/dL (ref 30.0–36.0)
MCV: 118.9 fL — ABNORMAL HIGH (ref 80.0–100.0)
Platelets: 52 10*3/uL — ABNORMAL LOW (ref 150–400)
RBC: 3.28 MIL/uL — ABNORMAL LOW (ref 4.22–5.81)
RDW: 12.8 % (ref 11.5–15.5)
WBC: 6.2 10*3/uL (ref 4.0–10.5)
nRBC: 0 % (ref 0.0–0.2)

## 2019-10-27 LAB — MAGNESIUM: Magnesium: 2 mg/dL (ref 1.7–2.4)

## 2019-10-27 MED ORDER — PERFLUTREN LIPID MICROSPHERE
1.0000 mL | INTRAVENOUS | Status: AC | PRN
  Administered 2019-10-27: 5 mL via INTRAVENOUS
  Filled 2019-10-27: qty 10

## 2019-10-27 MED ORDER — DIAZEPAM 2 MG PO TABS
2.0000 mg | ORAL_TABLET | Freq: Every evening | ORAL | Status: DC | PRN
  Administered 2019-10-27 – 2019-10-28 (×2): 2 mg via ORAL
  Filled 2019-10-27 (×2): qty 1

## 2019-10-27 MED ORDER — IPRATROPIUM-ALBUTEROL 0.5-2.5 (3) MG/3ML IN SOLN: 3.0000 mL | RESPIRATORY_TRACT | Status: DC | PRN

## 2019-10-27 NOTE — Evaluation (Signed)
Physical Therapy Evaluation Patient Details Name: Fernando [name] MRN: [id number] DOB: 1955-08-25 Today's Date: 10/27/2019   History of Present Illness  64 y/o male with history of alcohol abuse (half gallon of vodka daily for several years; no hx of seizures or intubations), tobacco abuse, HTN, anxiety, arthritis, gout, COVID in April, and recent hospitalization in [place] 6 weeks prior for pneumonia  presented to the ER on 10/25/19 with a chief complaint of weakness and dyspnea after making an attempt to quit drinking alcohol. He was admitted for a COPD exacerbation. CT chest negative for PE. Placed on CIWA protocol with increasing tachycardia and tremulousness. Admitted to ICU for precedex.    Clinical Impression   Pt admitted with above diagnosis. Patient lives alone and was independent without a device PTA. He currently is going through withdrawal and required moderate assistance to transfer bed to chair with use of RW. He is very tremulous and HR elevated from 90 to 169 bpm with slow return to 110s once seated in chair. Sats dropped with activity (on 4L decr to 82%; educated on pursed lip breathing with only incr to 85% after 4 minutes and RN in to assess pt. RN increased oxygen to 6L. Appears he may need home oxygen at discharge and would be at an even HIGHER risk of falling with home O2.  Pt currently with functional limitations due to the deficits listed below (see PT Problem List). Pt will benefit from skilled PT to increase their independence and safety with mobility to allow discharge to the venue listed below.       Follow Up Recommendations Supervision/Assistance - 24 hour;SNF (may progress to [facility] or OP at DTs subside)    Equipment Recommendations  Rolling walker with 5" wheels    Recommendations for Other Services OT consult     Precautions / Restrictions Precautions Precautions: Fall;Other (comment) Precaution Comments: watch HR      Mobility  Bed Mobility Overal bed  mobility: Needs Assistance Bed Mobility: Supine to Sit     Supine to sit: Mod assist     General bed mobility comments: Patient taking legs off bed first (and prior to instructions); assist to raise torso  Transfers Overall transfer level: Needs assistance Equipment used: Rolling walker (2 wheeled);None Transfers: Sit to/from [facility] Sit to Stand: Min assist;From elevated surface (ICU bed with seat deflated) Stand pivot transfers: Mod assist       General transfer comment: pt became more tremulous in standing and more tremulous with attempts to advance feet. Initial attempt with +1 HHA pt was leaning posteriorly and returned to sitting. 2nd attempt with RW (anchored by PT) with pt posterior, however able to correct and get his COM over his BOS  Ambulation/Gait             General Gait Details: unable; very high HR with stand-pivot and drop in sats  Stairs            Wheelchair Mobility    Modified Rankin (Stroke Patients Only)       Balance Overall balance assessment: Needs assistance Sitting-balance support: No upper extremity supported;Feet unsupported Sitting balance-Leahy Scale: Good     Standing balance support: Bilateral upper extremity supported Standing balance-Leahy Scale: Poor Standing balance comment: posterior lean                             Pertinent Vitals/Pain Pain Assessment: No/denies pain    Home Living  Family/patient expects to be discharged to:: Private residence Living Arrangements: Alone Available Help at Discharge: Friend(s);Available 24 hours/day (girlfriend--lives next door) Type of Home: House ([place]house) Home Access: Stairs to enter   [employer] of Steps: 1 Home Layout: One level Home Equipment: [place] - single point;Crutches      Prior Function Level of Independence: Independent         Comments: denies balance problems PTA; reports owns cane due to h/o knee pain      Hand Dominance   Dominant Hand: Right    Extremity/Trunk Assessment   Upper Extremity Assessment Upper Extremity Assessment: Generalized weakness (bil biceps/triceps 4/5)    Lower Extremity Assessment Lower Extremity Assessment: Generalized weakness    Cervical / Trunk Assessment Cervical / Trunk Assessment: Kyphotic  Communication   Communication: Other (comment) (shakey, low volume voice (required pt to repeat freq))  Cognition Arousal/Alertness: Awake/alert Behavior During Therapy: Restless;Impulsive (tremulous) Overall Cognitive Status: Impaired/Different from baseline Area of Impairment: Attention;Following commands;Safety/judgement;Awareness                   Current Attention Level: Sustained   Following Commands: Follows one step commands inconsistently Safety/Judgement: Decreased awareness of safety;Decreased awareness of deficits Awareness: Intellectual   General Comments: moving to EOB to sit prior to instructed to with no regard for lines/tubes      General Comments General comments (skin integrity, edema, etc.): Pt initially on 4L O2 with sats at rest 94% HR 90. With standing, HR up to 169, sats 85%. RN in to assess and after several minutes attempting pursed lip breathing. Pt remained at 87% and RN turned O2 up to 6L. Pt at 92% on departure    Exercises     Assessment/Plan    PT Assessment Patient needs continued PT services  PT Problem List Decreased strength;Decreased activity tolerance;Decreased balance;Decreased mobility;Decreased cognition;Decreased knowledge of use of DME;Decreased safety awareness;Decreased knowledge of precautions;Cardiopulmonary status limiting activity       PT Treatment Interventions DME instruction;Gait training;Functional mobility training;Therapeutic activities;Therapeutic exercise;Balance training;Neuromuscular re-education;Cognitive remediation;Patient/family education    PT Goals (Current goals can be found  in the Care Plan section)  Acute Rehab PT Goals Patient Stated Goal: to get well and go home PT Goal Formulation: With patient Time For Goal Achievement: 11/10/19 Potential to Achieve Goals: Good    Frequency Min 3X/week   Barriers to discharge Decreased caregiver support      Co-evaluation               AM-PAC PT "6 Clicks" Mobility  Outcome Measure Help needed turning from your back to your side while in a flat bed without using bedrails?: A Little Help needed moving from lying on your back to sitting on the side of a flat bed without using bedrails?: A Lot Help needed moving to and from a bed to a chair (including a wheelchair)?: A Lot Help needed standing up from a chair using your arms (e.g., wheelchair or bedside chair)?: A Lot Help needed to walk in hospital room?: Total Help needed climbing 3-5 steps with a railing? : Total 6 Click Score: 11    End of Session Equipment Utilized During Treatment: Gait belt;Oxygen Activity Tolerance: Treatment limited secondary to medical complications (Comment) (DTs, desaturates, elevated HR) Patient left: in chair;with call bell/phone within reach;with chair alarm set;with nursing/sitter in room Nurse Communication: Mobility status;Other (comment) (discussed ?incr O2 to 6L) PT Visit Diagnosis: Unsteadiness on feet (R26.81);Muscle weakness (generalized) (M62.81);Difficulty in walking, not elsewhere classified (R26.2)  Time: 5992-3414 PT Time Calculation (min) (ACUTE ONLY): 37 min   Charges:   PT Evaluation $PT Eval Low Complexity: 1 Low PT Treatments $Therapeutic Activity: 8-22 mins         Arby Barrette, PT Pager [phone]   Rexanne Mano 10/27/2019, 12:03 PM

## 2019-10-27 NOTE — Consult Note (Deleted)
NAME:  Fernando [name], MRN:  [id number], DOB:  [birth year], LOS: 2 ADMISSION DATE:  10/24/2019, CONSULTATION DATE:  10/26/2019 REFERRING MD:  Dr. Tonie Griffith, CHIEF COMPLAINT:  ETOH withdrawal  Brief History   64 year old male admitted 7/22 presenting with generalized weakness admitted for alcohol withdrawal, hypoxemia, and elevated liver enzymes.  Patient continued high CIWA scores despite 29 mg Ativan since admit   Stable on precedex 7/24  History of present illness   HPI obtained from medical chart review and per patient which is limited.   64 year old male with prior history of alcohol abuse (half gallon of vodka daily for several years; no hx of seizures or intubations), tobacco abuse, HTN, anxiety, arthritis, gout, COVID in April, and recent hospitalization in [place] 6 weeks prior for pneumonia who presented 7/22 with generalized weakness for 5 days with difficulty ambulating, decreased appetite, lethargy and difficulty breathing/ exertional shortness of breath.  Quit smoking and last drink of ETOH 5 days prior to admit.   Was afebrile, tachycardic and requiring 2L O2 at that time. Chest CT was negative for a PE but showed diffuse bilateral bronchial wall thickening and interlobular septal thickening, consistent with mild edema.  Given lasix and started on solumedrol.  Also noted to have elevated LFTs and ammonia with ammonia level 48.  RUQ US showed no acute abnormality and started on lactulose rectally.  Admitted to [facility].  Placed on CIWA protocol. Patient with increasing CIWA scores, most recently 15-20 with cumulative total of 29 mg of Ativan ~24 hrs.  PCCM called for possible ICU admit and precedex.     Past Medical History  Alcohol abuse Tobacco abuse HTN Gout Arthritis Anxiety   Home meds: allopurinol, norvasc, indomethicin  50mg  daily, telmisartan,  Significant Hospital Events   7/22 Admit to [facility]/ PCU  Consults:  PCCM  Procedures:   Significant Diagnostic Tests:  7/22 CTA PE  >> 1. Negative examination for pulmonary embolism. 2. Diffuse bilateral bronchial wall thickening and interlobular septal thickening, consistent with mild edema. 3. Mosaic attenuation of the airspaces throughout, suggestive of small airways disease. 4. Suspected there may be a component of underlying fibrotic interstitial lung disease. Consider nonemergent ILD protocol CT to further evaluate at the resolution of acute clinical symptoms. 5. Cardiomegaly and coronary artery disease.  7/22 RUQ [place] >> 1. No acute abnormality. 2. Hepatic steatosis.  CXR: 7/24:  IMPRESSION: Borderline stable cardiomegaly with suggestion of mild vascular congestion. Possible small amount of right pleural fluid/atelectasis in the right lung base versus infection.  Micro Data:  7/22 SARS 2 >> neg  Antimicrobials:   Interim history/subjective:   Objective   Blood pressure 100/65, pulse 84, temperature (!) 97.2 F (36.2 C), temperature source Axillary, resp. rate (!) 27, height 5\' 7"  (1.702 m), weight 72.2 kg, SpO2 (!) 88 %.        Intake/Output Summary (Last 24 hours) at 10/27/2019 1135 Last data filed at 10/27/2019 0947 Gross per 24 hour  Intake 1001.44 ml  Output 500 ml  Net 501.44 ml   Filed Weights   10/24/19 1523 10/24/19 1525 10/27/19 0500  Weight: 78 kg 74.8 kg 72.2 kg    Examination: General:  Older male sitting in bed in NAD, minimal tremor, no hallucinations Neuro: Awake, alert, pleasant CV: ST, no mgr PULM:CTAB Extremities: warm/dry, no edema   Resolved Hospital Problem list    Assessment & Plan:   Acute alcohol withdrawal/ DTs - Continue care in -Continue precedex for today.  Still with signs of withdrawal but generally well controlled.  - serial neuro exams - precedex per ICU CIWA protocol - continue librium taper  - seizure and aspiration precautions  - daily empiric thiamine/ folate   Acute hypoxic respiratory failure  Tobacco abuse CT changes of diffuse bilateral  bronchial wall thickening and interlobular septal thickening s/p lasix dose 7/22 - continue supplemental O2 for sat goal > 88% - continue solumedrol to 40 mg daily - continue bronchodilators - pulmonary toilet, IS - will likely need further pulmonary outpatient followup and workup for possible ILD changes vs post pneumonia changes - hold further diuresis given borderline BP - no need for abx at this time  Prolonged QTc - tele monitoring - avoid QTc prolonging meds  Macrocytic anemia - trend CBC  Thrombocytopenia - Stable, no active bleeding.   Transaminitis - possible alcoholic hepatitis   Elevated ammonia  Hypomagnesia - Mag 2gm now, recheck in am    TSH- 7.096 - recheck outpatient  Hx HTN - holding home meds given borderline soft blood pressure - monitor I/Os  Best practice:  Diet: advance diet Pain/Anxiety/Delirium protocol (if indicated): as above VAP protocol (if indicated): n/a DVT prophylaxis: heparin SQ/ SCDs GI prophylaxis: n/a Glucose control: trend on BMP Mobility: BR Code Status: Full  Family Communication: pending Disposition: admit ICU  Labs   CBC: Recent Labs  Lab 10/24/19 1530 10/25/19 1240 10/26/19 0324 10/27/19 0621  WBC 4.8 4.8 4.6 6.2  NEUTROABS  --  3.8  --   --   HGB 13.2 13.4 13.8 13.7  HCT 38.4* 38.8* 40.0 39.0  MCV 116.7* 119.8* 119.8* 118.9*  PLT 62* 57* 60* 52*    Basic Metabolic Panel: Recent Labs  Lab 10/24/19 1530 10/25/19 1240 10/26/19 0324 10/26/19 1259 10/27/19 0621  NA 136 138 136  --  138  K 3.8 4.0 3.7  --  3.8  CL 99 100 99  --  101  CO2 21* 25 20*  --  24  GLUCOSE 102* 94 176*  --  109*  BUN 7* 10 12  --  24*  CREATININE 0.73 0.92 0.99  --  0.81  CALCIUM 8.9 9.0 9.1  --  9.1  MG  --  1.6*  --  1.4* 2.0  PHOS  --  3.3  --  3.3 3.1   GFR: Estimated Creatinine Clearance: 83.9 mL/min (by C-G formula based on SCr of 0.81 mg/dL). Recent Labs  Lab 10/24/19 1530 10/25/19 1240 10/26/19 0324  10/27/19 0621  WBC 4.8 4.8 4.6 6.2    Liver Function Tests: Recent Labs  Lab 10/25/19 0811 10/25/19 1240 10/26/19 0324  AST 152* 117* 176*  ALT 87* 80* 95*  ALKPHOS 151* 139* 145*  BILITOT 2.7* 2.8* 4.1*  PROT 7.8 7.0 7.1  ALBUMIN 3.8 3.7 3.5   No results for input(s): LIPASE, AMYLASE in the last 168 hours. Recent Labs  Lab 10/25/19 0811 10/26/19 0324  AMMONIA 48* 35    ABG    Component Value Date/Time   PHART 7.469 (H) 07/30/2008 0837   PCO2ART 45.6 (H) 07/30/2008 0837   PO2ART 74.0 (L) 07/30/2008 0837   HCO3 33.1 (H) 07/30/2008 0837   TCO2 34 07/30/2008 0837   ACIDBASEDEF 1.0 07/21/2008 0534   O2SAT 95.0 07/30/2008 0837     Coagulation Profile: Recent Labs  Lab 10/25/19 0811 10/25/19 1240  INR 0.9 1.0    Cardiac Enzymes: No results for input(s): CKTOTAL, CKMB, CKMBINDEX, TROPONINI in the last 168 hours.  HbA1C: No results found for: HGBA1C  CBG: Recent Labs  Lab 10/25/19 0712 10/26/19 2352 10/27/19 0332 10/27/19 0742  GLUCAP 155* 136* 121* 110*    Review of Systems:   Unable   Past Medical History  He,  has a past medical history of Arthritis, Cataract, History of kidney stones, Hyperlipidemia, Hypertension, and Pneumonia.   Surgical History    Past Surgical History:  Procedure Laterality Date  . COLONOSCOPY    . HERNIA REPAIR  1990  . KNEE SURGERY  1975   left men. repair  . REVERSE SHOULDER ARTHROPLASTY Right 06/21/2018   Procedure: REVERSE SHOULDER ARTHROPLASTY;  Surgeon: Hiram Gash, MD;  Location: [facility];  Service: Orthopedics;  Laterality: Right;  . ROTATOR CUFF REPAIR     right  . TONSILLECTOMY    . UMBILICAL HERNIA REPAIR       Social History   reports that he has been smoking cigarettes. He has been smoking about 0.50 packs per day. He has never used smokeless tobacco. He reports current alcohol use of about 13.0 - 20.0 standard drinks of alcohol per week. He reports that he does not use drugs.   Family History   His  family history includes Heart failure in his mother; Lung cancer in his father. There is no history of Colon cancer, Esophageal cancer, Stomach cancer, Liver cancer, Pancreatic cancer, or Rectal cancer.   Allergies No Known Allergies   Home Medications  Prior to Admission medications   Medication Sig Start Date End Date Taking? Authorizing Provider  allopurinol (ZYLOPRIM) 100 MG tablet Take 200 mg by mouth daily.  08/09/18  Yes [provider]  Multiple Vitamin (MULTIVITAMIN) tablet Take 1 tablet by mouth once a week.   Yes [provider]  telmisartan (MICARDIS) 80 MG tablet Take 80 mg by mouth daily.    Yes [provider]  betamethasone dipropionate (DIPROLENE) 0.05 % cream Apply topically 2 (two) times daily. Patient not taking: Reported on 10/25/2019 09/04/18   [place] Callas, NP  hydrOXYzine (ATARAX/VISTARIL) 10 MG tablet Take 1 tablet (10 mg total) by mouth 3 (three) times daily as needed for itching. Patient not taking: Reported on 09/04/2018 08/21/18   [place] Callas, NP     Critical care time: 45 mins

## 2019-10-27 NOTE — Progress Notes (Signed)
  Echocardiogram 2D Echocardiogram has been performed with Definity.  Fernando [name] 10/27/2019, 5:31 PM

## 2019-10-27 NOTE — Plan of Care (Signed)

## 2019-10-27 NOTE — Progress Notes (Signed)
*  PRELIMINARY RESULTS* Echocardiogram 2D Echocardiogram has been performed.  Leavy Cella 10/27/2019, 3:52 PM

## 2019-10-27 NOTE — Progress Notes (Signed)
NAME:  Fernando [name], MRN:  [id number], DOB:  03-14-1956, LOS: 2 ADMISSION DATE:  10/24/2019, CONSULTATION DATE:  10/26/2019 REFERRING MD:  Dr. Tonie Griffith, CHIEF COMPLAINT:  ETOH withdrawal  Brief History   64 year old male admitted 7/22 presenting with generalized weakness admitted for alcohol withdrawal, hypoxemia, and elevated liver enzymes.  Patient continued high CIWA scores despite 29 mg Ativan since admit   Stable on precedex 7/24  History of present illness   HPI obtained from medical chart review and per patient which is limited.   64 year old male with prior history of alcohol abuse (half gallon of vodka daily for several years; no hx of seizures or intubations), tobacco abuse, HTN, anxiety, arthritis, gout, COVID in April, and recent hospitalization in [place] 6 weeks prior for pneumonia who presented 7/22 with generalized weakness for 5 days with difficulty ambulating, decreased appetite, lethargy and difficulty breathing/ exertional shortness of breath.  Quit smoking and last drink of ETOH 5 days prior to admit.   Was afebrile, tachycardic and requiring 2L O2 at that time. Chest CT was negative for a PE but showed diffuse bilateral bronchial wall thickening and interlobular septal thickening, consistent with mild edema.  Given lasix and started on solumedrol.  Also noted to have elevated LFTs and ammonia with ammonia level 48.  RUQ US showed no acute abnormality and started on lactulose rectally.  Admitted to [facility].  Placed on CIWA protocol. Patient with increasing CIWA scores, most recently 15-20 with cumulative total of 29 mg of Ativan ~24 hrs.  PCCM called for possible ICU admit and precedex.     Past Medical History  Alcohol abuse Tobacco abuse HTN Gout Arthritis Anxiety   Home meds: allopurinol, norvasc, indomethicin  50mg  daily, telmisartan,  Significant Hospital Events   7/22 Admit to [facility]/ PCU  Consults:  PCCM  Procedures:   Significant Diagnostic Tests:  7/22 CTA PE  >> 1. Negative examination for pulmonary embolism. 2. Diffuse bilateral bronchial wall thickening and interlobular septal thickening, consistent with mild edema. 3. Mosaic attenuation of the airspaces throughout, suggestive of small airways disease. 4. Suspected there may be a component of underlying fibrotic interstitial lung disease. Consider nonemergent ILD protocol CT to further evaluate at the resolution of acute clinical symptoms. 5. Cardiomegaly and coronary artery disease.  7/22 RUQ [place] >> 1. No acute abnormality. 2. Hepatic steatosis.  CXR: 7/24:  IMPRESSION: Borderline stable cardiomegaly with suggestion of mild vascular congestion. Possible small amount of right pleural fluid/atelectasis in the right lung base versus infection.  Micro Data:  7/22 SARS 2 >> neg  Antimicrobials:   Interim history/subjective:   Objective   Blood pressure 100/65, pulse 84, temperature (!) 97.2 F (36.2 C), temperature source Axillary, resp. rate (!) 27, height 5\' 7"  (1.702 m), weight 72.2 kg, SpO2 (!) 88 %.        Intake/Output Summary (Last 24 hours) at 10/27/2019 1149 Last data filed at 10/27/2019 0947 Gross per 24 hour  Intake 1001.44 ml  Output 500 ml  Net 501.44 ml   Filed Weights   10/24/19 1523 10/24/19 1525 10/27/19 0500  Weight: 78 kg 74.8 kg 72.2 kg    Examination: General:  Older male sitting in bed in NAD, minimal tremor, no hallucinations Neuro: Awake, alert, pleasant CV: ST, no mgr PULM:CTAB Extremities: warm/dry, no edema   Resolved Hospital Problem list    Assessment & Plan:   Acute alcohol withdrawal/ DTs - Continue care in -Continue precedex for today.  Still with signs of withdrawal but generally well controlled.  - serial neuro exams - precedex per ICU CIWA protocol - continue librium taper  - seizure and aspiration precautions  - daily empiric thiamine/ folate   Acute hypoxic respiratory failure  Tobacco abuse CT changes of diffuse bilateral  bronchial wall thickening and interlobular septal thickening s/p lasix dose 7/22 - continue supplemental O2 for sat goal > 88% - continue solumedrol to 40 mg daily - continue bronchodilators - pulmonary toilet, IS - will likely need further pulmonary outpatient followup and workup for possible ILD changes vs post pneumonia changes - hold further diuresis given borderline BP - no need for abx at this time  Prolonged QTc - tele monitoring - avoid QTc prolonging meds  Macrocytic anemia - trend CBC  Thrombocytopenia - Stable, no active bleeding.   Transaminitis - possible alcoholic hepatitis   Elevated ammonia  Hypomagnesia - Mag 2gm now, recheck in am    TSH- 7.096 - recheck outpatient  Hx HTN - holding home meds given borderline soft blood pressure - monitor I/Os  Best practice:  Diet: advance diet Pain/Anxiety/Delirium protocol (if indicated): as above VAP protocol (if indicated): n/a DVT prophylaxis: heparin SQ/ SCDs GI prophylaxis: n/a Glucose control: trend on BMP Mobility: BR Code Status: Full  Family Communication: pending Disposition: admit ICU  Labs   CBC: Recent Labs  Lab 10/24/19 1530 10/25/19 1240 10/26/19 0324 10/27/19 0621  WBC 4.8 4.8 4.6 6.2  NEUTROABS  --  3.8  --   --   HGB 13.2 13.4 13.8 13.7  HCT 38.4* 38.8* 40.0 39.0  MCV 116.7* 119.8* 119.8* 118.9*  PLT 62* 57* 60* 52*    Basic Metabolic Panel: Recent Labs  Lab 10/24/19 1530 10/25/19 1240 10/26/19 0324 10/26/19 1259 10/27/19 0621  NA 136 138 136  --  138  K 3.8 4.0 3.7  --  3.8  CL 99 100 99  --  101  CO2 21* 25 20*  --  24  GLUCOSE 102* 94 176*  --  109*  BUN 7* 10 12  --  24*  CREATININE 0.73 0.92 0.99  --  0.81  CALCIUM 8.9 9.0 9.1  --  9.1  MG  --  1.6*  --  1.4* 2.0  PHOS  --  3.3  --  3.3 3.1   GFR: Estimated Creatinine Clearance: 83.9 mL/min (by C-G formula based on SCr of 0.81 mg/dL). Recent Labs  Lab 10/24/19 1530 10/25/19 1240 10/26/19 0324  10/27/19 0621  WBC 4.8 4.8 4.6 6.2    Liver Function Tests: Recent Labs  Lab 10/25/19 0811 10/25/19 1240 10/26/19 0324  AST 152* 117* 176*  ALT 87* 80* 95*  ALKPHOS 151* 139* 145*  BILITOT 2.7* 2.8* 4.1*  PROT 7.8 7.0 7.1  ALBUMIN 3.8 3.7 3.5   No results for input(s): LIPASE, AMYLASE in the last 168 hours. Recent Labs  Lab 10/25/19 0811 10/26/19 0324  AMMONIA 48* 35    ABG    Component Value Date/Time   PHART 7.469 (H) 07/30/2008 0837   PCO2ART 45.6 (H) 07/30/2008 0837   PO2ART 74.0 (L) 07/30/2008 0837   HCO3 33.1 (H) 07/30/2008 0837   TCO2 34 07/30/2008 0837   ACIDBASEDEF 1.0 07/21/2008 0534   O2SAT 95.0 07/30/2008 0837     Coagulation Profile: Recent Labs  Lab 10/25/19 0811 10/25/19 1240  INR 0.9 1.0    Cardiac Enzymes: No results for input(s): CKTOTAL, CKMB, CKMBINDEX, TROPONINI in the last 168 hours.  HbA1C: No results found for: HGBA1C  CBG: Recent Labs  Lab 10/25/19 0712 10/26/19 2352 10/27/19 0332 10/27/19 0742  GLUCAP 155* 136* 121* 110*    Review of Systems:   Unable   Past Medical History  He,  has a past medical history of Arthritis, Cataract, History of kidney stones, Hyperlipidemia, Hypertension, and Pneumonia.   Surgical History    Past Surgical History:  Procedure Laterality Date  . COLONOSCOPY    . HERNIA REPAIR  1990  . KNEE SURGERY  1975   left men. repair  . REVERSE SHOULDER ARTHROPLASTY Right 06/21/2018   Procedure: REVERSE SHOULDER ARTHROPLASTY;  Surgeon: Hiram Gash, MD;  Location: [facility];  Service: Orthopedics;  Laterality: Right;  . ROTATOR CUFF REPAIR     right  . TONSILLECTOMY    . UMBILICAL HERNIA REPAIR       Social History   reports that he has been smoking cigarettes. He has been smoking about 0.50 packs per day. He has never used smokeless tobacco. He reports current alcohol use of about 13.0 - 20.0 standard drinks of alcohol per week. He reports that he does not use drugs.   Family History   His  family history includes Heart failure in his mother; Lung cancer in his father. There is no history of Colon cancer, Esophageal cancer, Stomach cancer, Liver cancer, Pancreatic cancer, or Rectal cancer.   Allergies No Known Allergies   Home Medications  Prior to Admission medications   Medication Sig Start Date End Date Taking? Authorizing Provider  allopurinol (ZYLOPRIM) 100 MG tablet Take 200 mg by mouth daily.  08/09/18  Yes [provider]  Multiple Vitamin (MULTIVITAMIN) tablet Take 1 tablet by mouth once a week.   Yes [provider]  telmisartan (MICARDIS) 80 MG tablet Take 80 mg by mouth daily.    Yes [provider]  betamethasone dipropionate (DIPROLENE) 0.05 % cream Apply topically 2 (two) times daily. Patient not taking: Reported on 10/25/2019 09/04/18   [place] Callas, NP  hydrOXYzine (ATARAX/VISTARIL) 10 MG tablet Take 1 tablet (10 mg total) by mouth 3 (three) times daily as needed for itching. Patient not taking: Reported on 09/04/2018 08/21/18   [place] Callas, NP     Critical care time: 35 mins

## 2019-10-28 ENCOUNTER — Inpatient Hospital Stay (HOSPITAL_COMMUNITY): Payer: Medicare Other

## 2019-10-28 LAB — GLUCOSE, CAPILLARY
Glucose-Capillary: 110 mg/dL — ABNORMAL HIGH (ref 70–99)
Glucose-Capillary: 127 mg/dL — ABNORMAL HIGH (ref 70–99)
Glucose-Capillary: 161 mg/dL — ABNORMAL HIGH (ref 70–99)
Glucose-Capillary: 214 mg/dL — ABNORMAL HIGH (ref 70–99)
Glucose-Capillary: 92 mg/dL (ref 70–99)
Glucose-Capillary: 97 mg/dL (ref 70–99)

## 2019-10-28 MED ORDER — LORAZEPAM 2 MG/ML IJ SOLN
2.0000 mg | Freq: Once | INTRAMUSCULAR | Status: AC
  Administered 2019-10-28: 2 mg via INTRAVENOUS
  Filled 2019-10-28: qty 1

## 2019-10-28 MED ORDER — FUROSEMIDE 10 MG/ML IJ SOLN: 20.0000 mg | Freq: Once | INTRAMUSCULAR | Status: DC

## 2019-10-28 MED ORDER — LORAZEPAM 2 MG/ML IJ SOLN: 2.0000 mg | Freq: Once | INTRAMUSCULAR | Status: AC

## 2019-10-28 MED ORDER — FUROSEMIDE 10 MG/ML IJ SOLN
40.0000 mg | Freq: Once | INTRAMUSCULAR | Status: AC
  Administered 2019-10-28: 40 mg via INTRAVENOUS
  Filled 2019-10-28: qty 4

## 2019-10-28 MED ORDER — HEPARIN SODIUM (PORCINE) 5000 UNIT/ML IJ SOLN
5000.0000 [IU] | Freq: Three times a day (TID) | INTRAMUSCULAR | Status: DC
  Administered 2019-10-28 – 2019-11-01 (×12): 5000 [IU] via SUBCUTANEOUS
  Filled 2019-10-28 (×13): qty 1

## 2019-10-28 NOTE — Progress Notes (Signed)
eLink Physician-Brief Progress Note Patient Name: Fernando [name] DOB: 1956-01-31 MRN: [id number]   Date of Service  10/28/2019  HPI/Events of Note  Head CT Scan wo contrast reveals chronic atrophic and ischemic changes without acute abnormality. Pelvis X-ray is negative for fracture or other evidence of acute injury.  eICU Interventions  Continue present management.      Intervention Category Major Interventions: Other:  Fernando, [name] 10/28/2019, 11:10 PM

## 2019-10-28 NOTE — Progress Notes (Signed)
Attempted to notify family of fall. Called sister in law Marlyne Beards [phone]) & son Eyoel Throgmorton [phone]). Called multiple times with no answer.   [place] notified.

## 2019-10-28 NOTE — Procedures (Deleted)
Intubation Procedure Note  Fernando [name]  [id number]  1955/12/15  Date:10/28/19  Time:4:58 AM   Provider Performing:Kodi Steil M Shadoe Cryan    Procedure: Intubation (29924)  Indication(s) Respiratory Failure  Consent Unable to obtain consent due to emergent nature of procedure.   Anesthesia    Time Out Verified patient identification, verified procedure, site/side was marked, verified correct patient position, special equipment/implants available, medications/allergies/relevant history reviewed, required imaging and test results available.   Sterile Technique Usual hand hygeine, masks, and gloves were used   Procedure Description Patient positioned in bed supine.  Sedation given as noted above.  Patient was intubated with endotracheal tube using Glidescope.  View was Grade 1 full glottis .  Number of attempts was 1.  Colorimetric CO2 detector was consistent with tracheal placement.   Complications/Tolerance None; patient tolerated the procedure well. Chest X-ray is ordered to verify placement.   EBL    Specimen(s) None  Cordella Register, RRT, RCP

## 2019-10-28 NOTE — Progress Notes (Signed)
PCCM Interval progress note:  Notified that pt had a fall.  This was unwitnessed, pt tried to get out of bed and says his foot caught and he fell to sitting.  RN reports worsening confusion all day, no acute change after fall and unclear if he hit his head.  On exam he has no midline neck or back pain.  Complains of mild L hip pain.  CT head ordered by e-link and will also obtain L hip xray and place fall precautions.  Otilio Carpen Lissandro Dilorenzo, PA-C

## 2019-10-28 NOTE — Progress Notes (Signed)
eLink Physician-Brief Progress Note Patient Name: Fernando [name] DOB: May 20, 1955 MRN: [id number]   Date of Service  10/28/2019  HPI/Events of Note  Increased confusion - Nursing reports fall at 6 PM tonight and that he is more confused than he was last evening. Patient is currently on Precedex for DT's.  eICU Interventions  Plan: 1. Head CT Scan wo contrast STAT. 2. Will ask ground team to evaluate the patient at bedside.      Intervention Category Major Interventions: Change in mental status - evaluation and management  Emani Taussig,Jeziah Cornelia Copa 10/28/2019, 8:38 PM

## 2019-10-28 NOTE — Progress Notes (Signed)
NAME:  Fernando [name], MRN:  [id number], DOB:  1955-10-13, LOS: 3 ADMISSION DATE:  10/24/2019, CONSULTATION DATE:  10/26/2019 REFERRING MD:  Dr. Tonie Griffith, CHIEF COMPLAINT:  ETOH withdrawal  Brief History   64 year old male admitted 7/22 presenting with generalized weakness admitted for alcohol withdrawal, hypoxemia, and elevated liver enzymes.  Patient continued high CIWA scores despite 29 mg Ativan since admit   Stable on precedex 7/24  History of present illness   HPI obtained from medical chart review and per patient which is limited.   64 year old male with prior history of alcohol abuse (half gallon of vodka daily for several years; no hx of seizures or intubations), tobacco abuse, HTN, anxiety, arthritis, gout, COVID in April, and recent hospitalization in [place] 6 weeks prior for pneumonia who presented 7/22 with generalized weakness for 5 days with difficulty ambulating, decreased appetite, lethargy and difficulty breathing/ exertional shortness of breath.  Quit smoking and last drink of ETOH 5 days prior to admit.   Was afebrile, tachycardic and requiring 2L O2 at that time. Chest CT was negative for a PE but showed diffuse bilateral bronchial wall thickening and interlobular septal thickening, consistent with mild edema.  Given lasix and started on solumedrol.  Also noted to have elevated LFTs and ammonia with ammonia level 48.  RUQ US showed no acute abnormality and started on lactulose rectally.  Admitted to [facility].  Placed on CIWA protocol. Patient with increasing CIWA scores, most recently 15-20 with cumulative total of 29 mg of Ativan ~24 hrs.  PCCM called for possible ICU admit and precedex.     Past Medical History  Alcohol abuse Tobacco abuse HTN Gout Arthritis Anxiety   Home meds: allopurinol, norvasc, indomethicin  50mg  daily, telmisartan,  Significant Hospital Events   7/22 Admit to [facility]/ PCU  Consults:  PCCM  Procedures:   Significant Diagnostic Tests:  7/22 CTA PE  >> 1. Negative examination for pulmonary embolism. 2. Diffuse bilateral bronchial wall thickening and interlobular septal thickening, consistent with mild edema. 3. Mosaic attenuation of the airspaces throughout, suggestive of small airways disease. 4. Suspected there may be a component of underlying fibrotic interstitial lung disease. Consider nonemergent ILD protocol CT to further evaluate at the resolution of acute clinical symptoms. 5. Cardiomegaly and coronary artery disease.  7/22 RUQ [place] >> 1. No acute abnormality. 2. Hepatic steatosis.  CXR: 7/24:  IMPRESSION: Borderline stable cardiomegaly with suggestion of mild vascular congestion. Possible small amount of right pleural fluid/atelectasis in the right lung base versus infection.  Micro Data:  7/22 SARS 2 >> neg  Antimicrobials:   Interim history/subjective:  Did not tolerate weaning precedex today.l  Objective   Blood pressure 114/72, pulse 95, temperature 98.3 F (36.8 C), temperature source Oral, resp. rate 18, height 5\' 7"  (1.702 m), weight 74 kg, SpO2 96 %.        Intake/Output Summary (Last 24 hours) at 10/28/2019 1824 Last data filed at 10/28/2019 1700 Gross per 24 hour  Intake 145.09 ml  Output 950 ml  Net -804.91 ml   Filed Weights   10/27/19 0500 10/28/19 0053 10/28/19 0115  Weight: 72.2 kg 74 kg 74 kg    Examination: General:  Older male sitting in bed in NAD, minimal tremor, no hallucinations Neuro: Awake, alert, pleasant CV: ST, no mgr PULM:CTAB Extremities: warm/dry, no edema   Resolved Hospital Problem list    Assessment & Plan:   Acute alcohol withdrawal/ DTs - Continue care in ICU Trial  off precedex this am, did not tolerate well.  -Continue precedex for today.  Still with signs of withdrawal but generally well controlled.  - serial neuro exams - precedex per ICU CIWA protocol - continue librium taper  - seizure and aspiration precautions  - daily empiric thiamine/ folate   Acute  hypoxic respiratory failure  Tobacco abuse CT changes of diffuse bilateral bronchial wall thickening and interlobular septal thickening s/p lasix dose 7/22 - continue supplemental O2 for sat goal > 88% - continue solumedrol to 40 mg daily - continue bronchodilators - pulmonary toilet, IS - will likely need further pulmonary outpatient followup and workup for possible ILD changes vs post pneumonia changes - hold further diuresis given borderline BP - no need for abx at this time  CHF: new diagnosis CHF EF 25% Possible apical thrombus.  Will need cardiology consult for new diagnosis.  Cont diuresis today.   Prolonged QTc - tele monitoring - avoid QTc prolonging meds  Macrocytic anemia - trend CBC  Thrombocytopenia - Stable, no active bleeding.   Transaminitis - possible alcoholic hepatitis   Elevated ammonia  Hypomagnesia - Mag 2gm now, recheck in am    TSH- 7.096 - recheck outpatient  Hx HTN - holding home meds given borderline soft blood pressure - monitor I/Os  Best practice:  Diet: advance diet Pain/Anxiety/Delirium protocol (if indicated): as above VAP protocol (if indicated): n/a DVT prophylaxis: heparin SQ/ SCDs GI prophylaxis: n/a Glucose control: trend on BMP Mobility: BR Code Status: Full  Family Communication: pending Disposition: admit ICU  Labs   CBC: Recent Labs  Lab 10/24/19 1530 10/25/19 1240 10/26/19 0324 10/27/19 0621  WBC 4.8 4.8 4.6 6.2  NEUTROABS  --  3.8  --   --   HGB 13.2 13.4 13.8 13.7  HCT 38.4* 38.8* 40.0 39.0  MCV 116.7* 119.8* 119.8* 118.9*  PLT 62* 57* 60* 52*    Basic Metabolic Panel: Recent Labs  Lab 10/24/19 1530 10/25/19 1240 10/26/19 0324 10/26/19 1259 10/27/19 0621  NA 136 138 136  --  138  K 3.8 4.0 3.7  --  3.8  CL 99 100 99  --  101  CO2 21* 25 20*  --  24  GLUCOSE 102* 94 176*  --  109*  BUN 7* 10 12  --  24*  CREATININE 0.73 0.92 0.99  --  0.81  CALCIUM 8.9 9.0 9.1  --  9.1  MG  --  1.6*  --   1.4* 2.0  PHOS  --  3.3  --  3.3 3.1   GFR: Estimated Creatinine Clearance: 83.9 mL/min (by C-G formula based on SCr of 0.81 mg/dL). Recent Labs  Lab 10/24/19 1530 10/25/19 1240 10/26/19 0324 10/27/19 0621  WBC 4.8 4.8 4.6 6.2    Liver Function Tests: Recent Labs  Lab 10/25/19 0811 10/25/19 1240 10/26/19 0324  AST 152* 117* 176*  ALT 87* 80* 95*  ALKPHOS 151* 139* 145*  BILITOT 2.7* 2.8* 4.1*  PROT 7.8 7.0 7.1  ALBUMIN 3.8 3.7 3.5   No results for input(s): LIPASE, AMYLASE in the last 168 hours. Recent Labs  Lab 10/25/19 0811 10/26/19 0324  AMMONIA 48* 35    ABG    Component Value Date/Time   PHART 7.469 (H) 07/30/2008 0837   PCO2ART 45.6 (H) 07/30/2008 0837   PO2ART 74.0 (L) 07/30/2008 0837   HCO3 33.1 (H) 07/30/2008 0837   TCO2 34 07/30/2008 0837   ACIDBASEDEF 1.0 07/21/2008 0534   O2SAT 95.0 07/30/2008  3338     Coagulation Profile: Recent Labs  Lab 10/25/19 0811 10/25/19 1240  INR 0.9 1.0    Cardiac Enzymes: No results for input(s): CKTOTAL, CKMB, CKMBINDEX, TROPONINI in the last 168 hours.  HbA1C: No results found for: HGBA1C  CBG: Recent Labs  Lab 10/28/19 0012 10/28/19 0358 10/28/19 0755 10/28/19 1131 10/28/19 1528  GLUCAP 110* 92 97 127* 214*    Review of Systems:   Unable   Past Medical History  He,  has a past medical history of Arthritis, Cataract, History of kidney stones, Hyperlipidemia, Hypertension, and Pneumonia.   Surgical History    Past Surgical History:  Procedure Laterality Date  . COLONOSCOPY    . HERNIA REPAIR  1990  . KNEE SURGERY  1975   left men. repair  . REVERSE SHOULDER ARTHROPLASTY Right 06/21/2018   Procedure: REVERSE SHOULDER ARTHROPLASTY;  Surgeon: Hiram Gash, MD;  Location: [facility];  Service: Orthopedics;  Laterality: Right;  . ROTATOR CUFF REPAIR     right  . TONSILLECTOMY    . UMBILICAL HERNIA REPAIR       Social History   reports that he has been smoking cigarettes. He has been smoking  about 0.50 packs per day. He has never used smokeless tobacco. He reports current alcohol use of about 13.0 - 20.0 standard drinks of alcohol per week. He reports that he does not use drugs.   Family History   His family history includes Heart failure in his mother; Lung cancer in his father. There is no history of Colon cancer, Esophageal cancer, Stomach cancer, Liver cancer, Pancreatic cancer, or Rectal cancer.   Allergies No Known Allergies   Home Medications  Prior to Admission medications   Medication Sig Start Date End Date Taking? Authorizing Provider  allopurinol (ZYLOPRIM) 100 MG tablet Take 200 mg by mouth daily.  08/09/18  Yes [provider]  Multiple Vitamin (MULTIVITAMIN) tablet Take 1 tablet by mouth once a week.   Yes [provider]  telmisartan (MICARDIS) 80 MG tablet Take 80 mg by mouth daily.    Yes [provider]  betamethasone dipropionate (DIPROLENE) 0.05 % cream Apply topically 2 (two) times daily. Patient not taking: Reported on 10/25/2019 09/04/18   [place] Callas, NP  hydrOXYzine (ATARAX/VISTARIL) 10 MG tablet Take 1 tablet (10 mg total) by mouth 3 (three) times daily as needed for itching. Patient not taking: Reported on 09/04/2018 08/21/18   [place] Callas, NP     Critical care time: 35 mins

## 2019-10-29 LAB — CBC WITH DIFFERENTIAL/PLATELET
Abs Immature Granulocytes: 0.04 10*3/uL (ref 0.00–0.07)
Basophils Absolute: 0 10*3/uL (ref 0.0–0.1)
Basophils Relative: 0 %
Eosinophils Absolute: 0 10*3/uL (ref 0.0–0.5)
Eosinophils Relative: 0 %
HCT: 32.7 % — ABNORMAL LOW (ref 39.0–52.0)
Hemoglobin: 11.4 g/dL — ABNORMAL LOW (ref 13.0–17.0)
Immature Granulocytes: 1 %
Lymphocytes Relative: 13 %
Lymphs Abs: 0.7 10*3/uL (ref 0.7–4.0)
MCH: 41 pg — ABNORMAL HIGH (ref 26.0–34.0)
MCHC: 34.9 g/dL (ref 30.0–36.0)
MCV: 117.6 fL — ABNORMAL HIGH (ref 80.0–100.0)
Monocytes Absolute: 0.8 10*3/uL (ref 0.1–1.0)
Monocytes Relative: 14 %
Neutro Abs: 4 10*3/uL (ref 1.7–7.7)
Neutrophils Relative %: 72 %
Platelets: 65 10*3/uL — ABNORMAL LOW (ref 150–400)
RBC: 2.78 MIL/uL — ABNORMAL LOW (ref 4.22–5.81)
RDW: 12.5 % (ref 11.5–15.5)
WBC: 5.4 10*3/uL (ref 4.0–10.5)
nRBC: 0 % (ref 0.0–0.2)

## 2019-10-29 LAB — COMPREHENSIVE METABOLIC PANEL
ALT: 79 U/L — ABNORMAL HIGH (ref 0–44)
AST: 51 U/L — ABNORMAL HIGH (ref 15–41)
Albumin: 2.7 g/dL — ABNORMAL LOW (ref 3.5–5.0)
Alkaline Phosphatase: 104 U/L (ref 38–126)
Anion gap: 10 (ref 5–15)
BUN: 21 mg/dL (ref 8–23)
CO2: 26 mmol/L (ref 22–32)
Calcium: 8.7 mg/dL — ABNORMAL LOW (ref 8.9–10.3)
Chloride: 101 mmol/L (ref 98–111)
Creatinine, Ser: 0.88 mg/dL (ref 0.61–1.24)
GFR calc Af Amer: >60 mL/min (ref >60)
GFR calc non Af Amer: >60 mL/min (ref >60)
Glucose, Bld: 120 mg/dL — ABNORMAL HIGH (ref 70–99)
Potassium: 3.3 mmol/L — ABNORMAL LOW (ref 3.5–5.1)
Sodium: 137 mmol/L (ref 135–145)
Total Bilirubin: 1.7 mg/dL — ABNORMAL HIGH (ref 0.3–1.2)
Total Protein: 5.8 g/dL — ABNORMAL LOW (ref 6.5–8.1)

## 2019-10-29 LAB — GLUCOSE, CAPILLARY
Glucose-Capillary: 112 mg/dL — ABNORMAL HIGH (ref 70–99)
Glucose-Capillary: 123 mg/dL — ABNORMAL HIGH (ref 70–99)
Glucose-Capillary: 129 mg/dL — ABNORMAL HIGH (ref 70–99)
Glucose-Capillary: 98 mg/dL (ref 70–99)

## 2019-10-29 LAB — MAGNESIUM: Magnesium: 1.8 mg/dL (ref 1.7–2.4)

## 2019-10-29 MED ORDER — THIAMINE HCL 100 MG PO TABS
100.0000 mg | ORAL_TABLET | Freq: Every day | ORAL | Status: DC
  Administered 2019-10-29 – 2019-11-01 (×4): 100 mg via ORAL
  Filled 2019-10-29 (×4): qty 1

## 2019-10-29 MED ORDER — NICOTINE 21 MG/24HR TD PT24
21.0000 mg | MEDICATED_PATCH | Freq: Every day | TRANSDERMAL | Status: DC
  Administered 2019-10-29: 21 mg via TRANSDERMAL
  Filled 2019-10-29 (×2): qty 1

## 2019-10-29 MED ORDER — LORAZEPAM 2 MG/ML IJ SOLN
2.0000 mg | INTRAMUSCULAR | Status: DC | PRN
  Administered 2019-10-30: 2 mg via INTRAVENOUS
  Filled 2019-10-29: qty 1

## 2019-10-29 MED ORDER — FUROSEMIDE 10 MG/ML IJ SOLN
40.0000 mg | Freq: Once | INTRAMUSCULAR | Status: AC
  Administered 2019-10-29: 40 mg via INTRAVENOUS
  Filled 2019-10-29: qty 4

## 2019-10-29 MED ORDER — PREDNISONE 20 MG PO TABS
20.0000 mg | ORAL_TABLET | Freq: Every day | ORAL | Status: DC
  Administered 2019-10-29 – 2019-11-01 (×4): 20 mg via ORAL
  Filled 2019-10-29: qty 1
  Filled 2019-10-29 (×2): qty 2
  Filled 2019-10-29: qty 1

## 2019-10-29 MED ORDER — POTASSIUM CHLORIDE CRYS ER 20 MEQ PO TBCR
20.0000 meq | EXTENDED_RELEASE_TABLET | ORAL | Status: AC
  Administered 2019-10-29 (×2): 20 meq via ORAL
  Filled 2019-10-29 (×2): qty 1

## 2019-10-29 MED ORDER — CLONIDINE HCL 0.1 MG PO TABS
0.1000 mg | ORAL_TABLET | Freq: Two times a day (BID) | ORAL | Status: DC
  Administered 2019-10-29 – 2019-10-30 (×2): 0.1 mg via ORAL
  Filled 2019-10-29 (×3): qty 1

## 2019-10-29 MED ORDER — MELATONIN 5 MG PO TABS
5.0000 mg | ORAL_TABLET | Freq: Every evening | ORAL | Status: DC | PRN
  Administered 2019-10-29: 5 mg via ORAL
  Filled 2019-10-29: qty 1

## 2019-10-29 NOTE — Progress Notes (Signed)
Needs cardiology consult.  See Dr. Duwayne Heck note.   "CHF: new diagnosis CHF EF 25% Possible apical thrombus.  Will need cardiology consult for new diagnosis."  Able to wean off Precedex and O2 - may need while sleeping???  Pt states 'drinks Vodka to go to sleep', stated never tried Benadryl or Melatonin.  Perhaps provider will consider.    Up to chair most of day.  VERY shaky/unsteady on feet.  Needs continued PT.  Scoots to edge of chair to use urinal.

## 2019-10-29 NOTE — Progress Notes (Signed)
NAME:  Fernando [name], MRN:  [id number], DOB:  September 09, 1955, LOS: 4 ADMISSION DATE:  10/24/2019, CONSULTATION DATE:  10/26/2019 REFERRING MD:  Dr. Tonie Griffith, CHIEF COMPLAINT:  ETOH withdrawal  Brief History   64 year old male admitted 7/22 presenting with generalized weakness admitted for alcohol withdrawal, hypoxemia, and elevated liver enzymes.  Patient continued high CIWA scores despite 29 mg Ativan since admit.  Past Medical History  Alcohol abuse Tobacco abuse HTN Gout Arthritis Anxiety   Significant Hospital Events   7/22 Admit to [facility]/ PCU  Consults:    Procedures:   Significant Diagnostic Tests:  7/22 CTA PE >> 1. Negative examination for pulmonary embolism. 2. Diffuse bilateral bronchial wall thickening and interlobular septal thickening, consistent with mild edema. 3. Mosaic attenuation of the airspaces throughout, suggestive of small airways disease. 4. Suspected there may be a component of underlying fibrotic interstitial lung disease. Consider nonemergent ILD protocol CT to further evaluate at the resolution of acute clinical symptoms. 5. Cardiomegaly and coronary artery disease.  7/22 RUQ [place] >> 1. No acute abnormality. 2. Hepatic steatosis.   Micro Data:  7/22 SARS 2 >> neg  Antimicrobials:   Interim history/subjective:  Remains on precedex.  Denies chest/abdominal pain, nausea, dyspnea.  Objective   Blood pressure 114/76, pulse 71, temperature 97.7 F (36.5 C), temperature source Oral, resp. rate 15, height 5\' 7"  (1.702 m), weight 74.1 kg, SpO2 97 %.        Intake/Output Summary (Last 24 hours) at 10/29/2019 0748 Last data filed at 10/29/2019 0600 Gross per 24 hour  Intake 209.79 ml  Output 650 ml  Net -440.21 ml   Filed Weights   10/28/19 0053 10/28/19 0115 10/29/19 0348  Weight: 74 kg 74 kg 74.1 kg    Examination:  General - alert Eyes - pupils reactive ENT - no sinus tenderness, no stridor Cardiac - regular rate/rhythm, no murmur Chest -  equal breath sounds b/l, no wheezing or rales Abdomen - soft, non tender, + bowel sounds Extremities - no cyanosis, clubbing, or edema Skin - no rashes Neuro - normal strength, moves extremities, follows commands Psych - normal mood and behavior  Resolved Hospital Problem list   Elevated LFTs  Assessment & Plan:   Delirium tremens. - wean precedex with RASS goal 0 - continue librium - ativan prn for CIWA > 8 - add clonidine - thiamine, folic acid, MVI  Acute hypoxic respiratory failure with  diffuse bilateral bronchial wall thickening and interlobular septal thickening on CT chest. Tobacco abuse. - goal SpO2 88 to 95% - f/u CXR intermittently - change steroids to prednisone 20 mg daily - nicotine patch - prn BDs  Acute systolic CHF. Possible apical thrombus.  Prolonged QTc. Hx of hypertension. - negative fluid balance as tolerated - will need further cardiac assessment when more stable  Macrocytic anemia, thrombocytopenia. - 2nd to ETOH - f/u CBC  Elevated TSH. - will need f/u TSH as outpt when stable  Best practice:  Diet: regular DVT prophylaxis: heparin SQ/ SCDs GI prophylaxis: n/a Mobility: BR Code Status: Full  Disposition: ICU  Labs    CMP Latest Ref Rng & Units 10/29/2019 10/27/2019 10/26/2019  Glucose 70 - 99 mg/dL 120(H) 109(H) 176(H)  BUN 8 - 23 mg/dL 21 24(H) 12  Creatinine 0.61 - 1.24 mg/dL 0.88 0.81 0.99  Sodium 135 - 145 mmol/L 137 138 136  Potassium 3.5 - 5.1 mmol/L 3.3(L) 3.8 3.7  Chloride 98 - 111 mmol/L 101 101 99  CO2  22 - 32 mmol/L 26 24 20(L)  Calcium 8.9 - 10.3 mg/dL 8.7(L) 9.1 9.1  Total Protein 6.5 - 8.1 g/dL 5.8(L) - 7.1  Total Bilirubin 0.3 - 1.2 mg/dL 1.7(H) - 4.1(H)  Alkaline Phos 38 - 126 U/L 104 - 145(H)  AST 15 - 41 U/L 51(H) - 176(H)  ALT 0 - 44 U/L 79(H) - 95(H)    CBC Latest Ref Rng & Units 10/29/2019 10/27/2019 10/26/2019  WBC 4.0 - 10.5 K/uL 5.4 6.2 4.6  Hemoglobin 13.0 - 17.0 g/dL 11.4(L) 13.7 13.8  Hematocrit 39 -  52 % 32.7(L) 39.0 40.0  Platelets 150 - 400 K/uL 65(L) 52(L) 60(L)    ABG    Component Value Date/Time   PHART 7.469 (H) 07/30/2008 0837   PCO2ART 45.6 (H) 07/30/2008 0837   PO2ART 74.0 (L) 07/30/2008 0837   HCO3 33.1 (H) 07/30/2008 0837   TCO2 34 07/30/2008 0837   ACIDBASEDEF 1.0 07/21/2008 0534   O2SAT 95.0 07/30/2008 0837    CBG (last 3)  Recent Labs    10/28/19 2004 10/29/19 0003 10/29/19 0351  GLUCAP 161* 129* 123*    Critical care time: 32 minutes  Chesley Mires, MD [place] Pager - [phone] 10/29/2019, 7:56 AM

## 2019-10-29 NOTE — Progress Notes (Signed)
eLink Physician-Brief Progress Note Patient Name: Fernando [name] DOB: 03/24/1956 MRN: [id number]   Date of Service  10/29/2019  HPI/Events of Note  Patient requests Melatonin for sleep.  eICU Interventions  Will order: 1. Melatonin 5 mg PO Q HS PRN sleep.      Intervention Category Major Interventions: Other:  Michelangelo, [place] 10/29/2019, 9:11 PM

## 2019-10-29 NOTE — Plan of Care (Signed)

## 2019-10-29 NOTE — Progress Notes (Signed)
eLink Physician-Brief Progress Note Patient Name: Fernando [name] DOB: 08-03-1955 MRN: [id number]   Date of Service  10/29/2019  HPI/Events of Note  History of tobacco abuse - Patient requests a cigarette.   eICU Interventions  Plan: 1. Nicotine patch 21 mg to skin now and Q day.      Intervention Category Major Interventions: Other:  Braylinn Gulden,Renso Cornelia Copa 10/29/2019, 3:07 AM

## 2019-10-29 NOTE — Progress Notes (Signed)
[facility] ADULT ICU REPLACEMENT PROTOCOL   The patient does apply for the [facility] Adult ICU Electrolyte Replacment Protocol based on the criteria listed below:   1. Is GFR >/= 30 ml/min? Yes.    Patient's GFR today is >60 2. Is SCr </= 2? Yes.   Patient's SCr is 0.88 ml/kg/hr 3. Did SCr increase >/= 0.5 in 24 hours? 4. Abnormal electrolyte(s): K+3.3 5. Ordered repletion with: protocol 6. If a panic level lab has been reported, has the CCM MD in charge been notified? Yes.  .   Physician:  Dr.Sommer  Carlisle Beers 10/29/2019 4:01 AM

## 2019-10-29 NOTE — Progress Notes (Signed)
Per MD ok to wean off Precedex as pt tolerates and to change accu checks from Q4h to ACHS.  Pt eating and drinking well.    Jocelyn Lamer, signif other, at bedside.

## 2019-10-30 LAB — BASIC METABOLIC PANEL
Anion gap: 12 (ref 5–15)
BUN: 19 mg/dL (ref 8–23)
CO2: 25 mmol/L (ref 22–32)
Calcium: 9.1 mg/dL (ref 8.9–10.3)
Chloride: 100 mmol/L (ref 98–111)
Creatinine, Ser: 0.9 mg/dL (ref 0.61–1.24)
GFR calc Af Amer: >60 mL/min (ref >60)
GFR calc non Af Amer: >60 mL/min (ref >60)
Glucose, Bld: 111 mg/dL — ABNORMAL HIGH (ref 70–99)
Potassium: 3.6 mmol/L (ref 3.5–5.1)
Sodium: 137 mmol/L (ref 135–145)

## 2019-10-30 LAB — GLUCOSE, CAPILLARY: Glucose-Capillary: 100 mg/dL — ABNORMAL HIGH (ref 70–99)

## 2019-10-30 LAB — MAGNESIUM: Magnesium: 1.7 mg/dL (ref 1.7–2.4)

## 2019-10-30 MED ORDER — NICOTINE 14 MG/24HR TD PT24
14.0000 mg | MEDICATED_PATCH | Freq: Every day | TRANSDERMAL | Status: DC
  Administered 2019-10-30 – 2019-11-01 (×3): 14 mg via TRANSDERMAL
  Filled 2019-10-30 (×3): qty 1

## 2019-10-30 MED ORDER — OXYMETAZOLINE HCL 0.05 % NA SOLN
1.0000 | Freq: Two times a day (BID) | NASAL | Status: AC
  Administered 2019-10-30 (×2): 1 via NASAL
  Filled 2019-10-30: qty 30

## 2019-10-30 MED ORDER — SALINE SPRAY 0.65 % NA SOLN
1.0000 | NASAL | Status: DC | PRN
  Filled 2019-10-30: qty 44

## 2019-10-30 MED ORDER — CARVEDILOL 3.125 MG PO TABS
3.1250 mg | ORAL_TABLET | Freq: Two times a day (BID) | ORAL | Status: DC
  Administered 2019-10-30 – 2019-11-01 (×4): 3.125 mg via ORAL
  Filled 2019-10-30 (×6): qty 1

## 2019-10-30 MED ORDER — FLUTICASONE PROPIONATE 50 MCG/ACT NA SUSP
1.0000 | Freq: Every day | NASAL | Status: DC
  Administered 2019-10-30 – 2019-11-01 (×3): 1 via NASAL
  Filled 2019-10-30: qty 16

## 2019-10-30 NOTE — Progress Notes (Signed)
Physical Therapy Treatment Patient Details Name: Fernando [name] MRN: [id number] DOB: 03-02-1956 Today's Date: 10/30/2019    History of Present Illness 64 y/o male with history of alcohol abuse (half gallon of vodka daily for several years; no hx of seizures or intubations), tobacco abuse, HTN, anxiety, arthritis, gout, COVID in April, and recent hospitalization in [place] 6 weeks prior for pneumonia  presented to the ER on 10/25/19 with a chief complaint of weakness and dyspnea after making an attempt to quit drinking alcohol. He was admitted for a COPD exacerbation. CT chest negative for PE. Placed on CIWA protocol with increasing tachycardia and tremulousness. Admitted to ICU for precedex.      PT Comments    Pt up in the chair on arrival, eager to get up and walk in the halls. .  Emphasis on progressing gait stability, stamina, keeping sats up at adequate levels and    Follow Up Recommendations  Supervision/Assistance - 24 hour;SNF     Equipment Recommendations  Rolling walker with 5" wheels    Recommendations for Other Services       Precautions / Restrictions Precautions Precautions: Fall Precaution Comments: watch HR    Mobility  Bed Mobility               General bed mobility comments: up in the chair on arrival  Transfers Overall transfer level: Needs assistance Equipment used: Rolling walker (2 wheeled);None Transfers: Sit to/from Stand Sit to Stand: Min guard         General transfer comment: cues to use arm rests for assist of boost.  Ambulation/Gait Ambulation/Gait assistance: Min assist;Min guard Gait Distance (Feet): 240 Feet Assistive device: Rolling walker (2 wheeled) Gait Pattern/deviations: Step-through pattern Gait velocity: moderate Gait velocity interpretation: 1.31 - 2.62 ft/sec, indicative of limited community ambulator General Gait Details: generally steady, but slower with ability to insignificantly increase gait speed.   Stairs              Wheelchair Mobility    Modified Rankin (Stroke Patients Only)       Balance Overall balance assessment: Needs assistance   Sitting balance-Leahy Scale: Good Sitting balance - Comments: good   Standing balance support: Bilateral upper extremity supported;No upper extremity supported Standing balance-Leahy Scale: Fair Standing balance comment: posterior lean initially progressing to steady standc f                            Cognition Arousal/Alertness: Awake/alert Behavior During Therapy: WFL for tasks assessed/performed Overall Cognitive Status: Within Functional Limits for tasks assessed                                        Exercises      General Comments General comments (skin integrity, edema, etc.): On room air sats held at 89-91% on room air.  EHR up to the mid 120's      Pertinent Vitals/Pain Pain Assessment: No/denies pain    Home Living                      Prior Function            PT Goals (current goals can now be found in the care plan section) Acute Rehab PT Goals Patient Stated Goal: to get well and go home PT Goal Formulation: With patient Time For Goal  Achievement: 11/10/19 Potential to Achieve Goals: Good Progress towards PT goals: Progressing toward goals    Frequency    Min 3X/week      PT Plan Current plan remains appropriate    Co-evaluation              AM-PAC PT "6 Clicks" Mobility   Outcome Measure  Help needed turning from your back to your side while in a flat bed without using bedrails?: A Little Help needed moving from lying on your back to sitting on the side of a flat bed without using bedrails?: A Little Help needed moving to and from a bed to a chair (including a wheelchair)?: A Little Help needed standing up from a chair using your arms (e.g., wheelchair or bedside chair)?: A Little Help needed to walk in hospital room?: A Little Help needed climbing 3-5  steps with a railing? : A Little 6 Click Score: 18    End of Session   Activity Tolerance: Patient tolerated treatment well Patient left: in chair;with call bell/phone within reach;with chair alarm set;with nursing/sitter in room   PT Visit Diagnosis: Unsteadiness on feet (R26.81);Difficulty in walking, not elsewhere classified (R26.2)     Time: 4536-4680 PT Time Calculation (min) (ACUTE ONLY): 21 min  Charges:  $Gait Training: 8-22 mins                     10/30/2019  Ginger Carne., PT Acute Rehabilitation Services [phone]  (pager) [phone]  (office)   Tessie Fass Dustyn Armbrister 10/30/2019, 3:51 PM

## 2019-10-30 NOTE — Progress Notes (Addendum)
NAME:  Fernando [name], MRN:  [id number], DOB:  July 18, 1955, LOS: 5 ADMISSION DATE:  10/24/2019, CONSULTATION DATE:  10/26/2019 REFERRING MD:  Dr. Tonie Griffith, CHIEF COMPLAINT:  ETOH withdrawal  Brief History   64 year old male admitted 7/22 presenting with generalized weakness admitted for alcohol withdrawal, hypoxemia, and elevated liver enzymes.  Patient continued high CIWA scores despite 29 mg Ativan since admit.  Past Medical History  Alcohol abuse Tobacco abuse HTN Gout Arthritis Anxiety   Significant Hospital Events   7/22 Admit to [facility] PCU 7/26 off precedex 7/27 transfer to telemetry  Consults:    Procedures:   Significant Diagnostic Tests:  7/22 CTA PE >> 1. Negative examination for pulmonary embolism. 2. Diffuse bilateral bronchial wall thickening and interlobular septal thickening, consistent with mild edema. 3. Mosaic attenuation of the airspaces throughout, suggestive of small airways disease. 4. Suspected there may be a component of underlying fibrotic interstitial lung disease. Consider nonemergent ILD protocol CT to further evaluate at the resolution of acute clinical symptoms. 5. Cardiomegaly and coronary artery disease.  7/22 RUQ [place] >> 1. No acute abnormality. 2. Hepatic steatosis.   Micro Data:  7/22 SARS 2 >> neg  Antimicrobials:   Interim history/subjective:  Complains of sinus congestion.  Denies chest pain, dyspnea, nausea.  Feels weak when getting out of bed.  Objective   Blood pressure 126/74, pulse (!) 108, temperature 98.4 F (36.9 C), temperature source Oral, resp. rate 22, height 5\' 7"  (1.702 m), weight 74.1 kg, SpO2 92 %.        Intake/Output Summary (Last 24 hours) at 10/30/2019 3557 Last data filed at 10/30/2019 0000 Gross per 24 hour  Intake 1044.43 ml  Output 1825 ml  Net -780.57 ml   Filed Weights   10/28/19 0053 10/28/19 0115 10/29/19 0348  Weight: 74 kg 74 kg 74.1 kg    Examination:  General - alert Eyes - pupils  reactive ENT - no sinus tenderness, no stridor Cardiac - regular, tachycardic Chest - equal breath sounds b/l, no wheezing or rales Abdomen - soft, non tender, + bowel sounds Extremities - no cyanosis, clubbing, or edema Skin - no rashes Neuro - normal strength, moves extremities, follows commands Psych - normal mood and behavior  Resolved Hospital Problem list   Elevated LFTs  Assessment & Plan:   Delirium tremens. - off precedex 7/26 - CIWA q4h with prn ativan for CIWA > 8 - continue clonidine 0.1 mg bid - continue thiamine, folic acid, MVI  Acute hypoxic respiratory failure with  diffuse bilateral bronchial wall thickening and interlobular septal thickening on CT chest. Tobacco abuse. - f/u CXR intermittently - changed to prednisone 20 mg daily on 7/27 >> wean off slowly as tolerated - nicotine patch - prn BDs  Acute systolic CHF. Possible apical thrombus.  Prolonged QTc. Hx of hypertension. - add coreg 7/27 - monitor on telemetry  Macrocytic anemia, thrombocytopenia. - 2nd to ETOH - f/u CBC intermittently  Elevated TSH. - will need f/u TSH as outpt when stable  Rhinitis. - afrin x two doses - flonase x 4 days - prn saline nasal spray  Deconditioning. - PT/OT  Best practice:  Diet: regular DVT prophylaxis: heparin SQ/ SCDs GI prophylaxis: n/a Mobility: BR Code Status: Full  Disposition: transfer to telemetry 7/27 >> to [facility] 7/28 and PCCM off  Labs    CMP Latest Ref Rng & Units 10/30/2019 10/29/2019 10/27/2019  Glucose 70 - 99 mg/dL 111(H) 120(H) 109(H)  BUN 8 - 23  mg/dL 19 21 24(H)  Creatinine 0.61 - 1.24 mg/dL 0.90 0.88 0.81  Sodium 135 - 145 mmol/L 137 137 138  Potassium 3.5 - 5.1 mmol/L 3.6 3.3(L) 3.8  Chloride 98 - 111 mmol/L 100 101 101  CO2 22 - 32 mmol/L 25 26 24   Calcium 8.9 - 10.3 mg/dL 9.1 8.7(L) 9.1  Total Protein 6.5 - 8.1 g/dL - 5.8(L) -  Total Bilirubin 0.3 - 1.2 mg/dL - 1.7(H) -  Alkaline Phos 38 - 126 U/L - 104 -  AST 15 - 41  U/L - 51(H) -  ALT 0 - 44 U/L - 79(H) -    CBC Latest Ref Rng & Units 10/29/2019 10/27/2019 10/26/2019  WBC 4.0 - 10.5 K/uL 5.4 6.2 4.6  Hemoglobin 13.0 - 17.0 g/dL 11.4(L) 13.7 13.8  Hematocrit 39 - 52 % 32.7(L) 39.0 40.0  Platelets 150 - 400 K/uL 65(L) 52(L) 60(L)    ABG    Component Value Date/Time   PHART 7.469 (H) 07/30/2008 0837   PCO2ART 45.6 (H) 07/30/2008 0837   PO2ART 74.0 (L) 07/30/2008 0837   HCO3 33.1 (H) 07/30/2008 0837   TCO2 34 07/30/2008 0837   ACIDBASEDEF 1.0 07/21/2008 0534   O2SAT 95.0 07/30/2008 0837    CBG (last 3)  Recent Labs    10/29/19 1958 10/29/19 2231 10/30/19 0802  GLUCAP 98 112* 100*    Signature:  Chesley Mires, MD [place] Pager - [phone] 10/30/2019, 8:08 AM

## 2019-10-31 ENCOUNTER — Inpatient Hospital Stay (HOSPITAL_COMMUNITY): Payer: Medicare Other

## 2019-10-31 ENCOUNTER — Telehealth: Payer: Self-pay | Admitting: *Deleted

## 2019-10-31 DIAGNOSIS — J984 Other disorders of lung: Secondary | ICD-10-CM

## 2019-10-31 DIAGNOSIS — D696 Thrombocytopenia, unspecified: Secondary | ICD-10-CM

## 2019-10-31 DIAGNOSIS — I429 Cardiomyopathy, unspecified: Secondary | ICD-10-CM

## 2019-10-31 DIAGNOSIS — J9601 Acute respiratory failure with hypoxia: Secondary | ICD-10-CM | POA: Diagnosis not present

## 2019-10-31 DIAGNOSIS — F10230 Alcohol dependence with withdrawal, uncomplicated: Secondary | ICD-10-CM

## 2019-10-31 DIAGNOSIS — R000 Tachycardia, unspecified: Secondary | ICD-10-CM | POA: Diagnosis not present

## 2019-10-31 DIAGNOSIS — J189 Pneumonia, unspecified organism: Secondary | ICD-10-CM

## 2019-10-31 MED ORDER — MAGNESIUM SULFATE 2 GM/50ML IV SOLN
2.0000 g | Freq: Once | INTRAVENOUS | Status: AC
  Administered 2019-10-31: 2 g via INTRAVENOUS
  Filled 2019-10-31: qty 50

## 2019-10-31 MED ORDER — POTASSIUM CHLORIDE CRYS ER 20 MEQ PO TBCR
40.0000 meq | EXTENDED_RELEASE_TABLET | Freq: Once | ORAL | Status: AC
  Administered 2019-10-31: 40 meq via ORAL
  Filled 2019-10-31: qty 2

## 2019-10-31 NOTE — Telephone Encounter (Signed)
Patient still admitted, but appointment made on Sept 10 at 1030 with Tammy Parrett so it will print on AVS once discharged from hospital. Dr. Halford Chessman in office but books full. Will route as FYI to RA

## 2019-10-31 NOTE — Evaluation (Signed)
Occupational Therapy Evaluation Patient Details Name: Fernando [name] MRN: [id number] DOB: 03/22/1956 Today's Date: 10/31/2019    History of Present Illness 64 y/o male with history of alcohol abuse (half gallon of vodka daily for several years; no hx of seizures or intubations), tobacco abuse, HTN, anxiety, arthritis, gout, COVID in April, and recent hospitalization in [place] 6 weeks prior for pneumonia  presented to the ER on 10/25/19 with a chief complaint of weakness and dyspnea after making an attempt to quit drinking alcohol. He was admitted for a COPD exacerbation. CT chest negative for PE. Placed on CIWA protocol with increasing tachycardia and tremulousness. Admitted to ICU for precedex.     Clinical Impression   PTA, pt Independent with all ADLs, IADLs and mobility without AD. Pt has significant other that lives next door and can assist on discharge. Pt presents now with deficits in strength and balance with limitations in endurance due to rising HR with activity (up to 138bpm today). Pt demonstrates mobility with RW at Supervision level hallway distances with min guard provided for picking up ADL items from floor for safety. Pt requires no more than Supervision for LB ADLs in standing and when ambulating. Educated on safety and pacing strategies to implement during daily tasks with pt actively involved in conversation. No further skilled OT services needed at acute level, but would benefit from HHOT/PT follow-up at discharge.    Follow Up Recommendations  Home health OT    Equipment Recommendations  Other (comment) (RW)    Recommendations for Other Services Other (comment) (Alcohol cessation resources )     Precautions / Restrictions Precautions Precautions: Fall Restrictions Weight Bearing Restrictions: No      Mobility Bed Mobility               General bed mobility comments: up in the chair on arrival  Transfers Overall transfer level: Needs assistance Equipment  used: Rolling walker (2 wheeled);None Transfers: Sit to/from [employer] to Stand: Supervision Stand pivot transfers: Supervision       General transfer comment: Supervision to Mod I for sit to stands with RW, Supervision for stand pivot with and without RW with cues for safe hand placement    Balance Overall balance assessment: Needs assistance Sitting-balance support: No upper extremity supported;Feet unsupported Sitting balance-Leahy Scale: Good     Standing balance support: Bilateral upper extremity supported;No upper extremity supported Standing balance-Leahy Scale: Fair Standing balance comment: able to stand unsupported, but mild unsteadiness during dynamic movements without UE support                           ADL either performed or assessed with clinical judgement   ADL Overall ADL's : Needs assistance/impaired Eating/Feeding: Independent;Sitting   Grooming: Supervision/safety;Standing   Upper Body Bathing: Independent;Sitting   Lower Body Bathing: Sit to/from stand;Modified independent   Upper Body Dressing : Independent;Sitting   Lower Body Dressing: Modified independent;Sit to/from stand Lower Body Dressing Details (indicate cue type and reason): Able to don socks independently  Toilet Transfer: Supervision/safety;Ambulation;Comfort height toilet;RW Armed forces technical officer Details (indicate cue type and reason): Supervision for safety due to increasing HR, cues for RW use for turning Toileting- Clothing Manipulation and Hygiene: Independent;Sit to/from stand   Tub/ Shower Transfer: Supervision/safety;Ambulation;Shower seat;Rolling walker   Functional mobility during ADLs: Supervision/safety;Rolling walker;Cueing for safety General ADL Comments: pt requires supervision at most for ammbulation and ADL tasks in standing due to decreased endurance and increasing  HR with activity, requiring cues for pacing and taking breaks     Vision  Baseline Vision/History: Wears glasses Wears Glasses: Reading only Patient Visual Report: No change from baseline Vision Assessment?: No apparent visual deficits     Perception     Praxis      Pertinent Vitals/Pain Pain Assessment: No/denies pain     Hand Dominance Right   Extremity/Trunk Assessment Upper Extremity Assessment Upper Extremity Assessment: LUE deficits/detail LUE Deficits / Details: Decreased AROM of shoulder flexion to about 80* - reports after fall at hospital   Lower Extremity Assessment Lower Extremity Assessment: Defer to PT evaluation   Cervical / Trunk Assessment Cervical / Trunk Assessment: Normal   Communication Communication Communication: No difficulties   Cognition Arousal/Alertness: Awake/alert Behavior During Therapy: WFL for tasks assessed/performed Overall Cognitive Status: Within Functional Limits for tasks assessed Area of Impairment: Safety/judgement                         Safety/Judgement: Decreased awareness of deficits     General Comments: A&Ox4, appropriate responses and pleasant. Pt with decreased awareness of safety and deficits with tendency to mobilize fast despite increasing HR - requiring cues for pacing and taking breaks   General Comments  HR up to 138bpm during mobility in hallway with pt tendency to walk fast, requiring cues for pacing and taking breaks. Educated on use of his shower seat for bathing tasks at home to manage HR, assistance with IADLs as needed. Pt reporting desire to stop drinking, encouraged pt in this task and educating on use of AA and friend/family support    Exercises     Shoulder Instructions      Home Living Family/patient expects to be discharged to:: Private residence Living Arrangements: Alone Available Help at Discharge: Friend(s);Available 24 hours/day (girlfriend lives next door) Type of Home: House Home Access: Stairs to enter [employer] of Steps: 1   Home  Layout: Two level;Able to live on main level with bedroom/bathroom     Bathroom Shower/Tub: Hospital doctor Toilet: Handicapped height     Home Equipment: [place] - single point;Crutches;Shower seat - built in          Prior Functioning/Environment Level of Independence: Independent        Comments: Independent with ADLs, IADLs and mobility without AD        OT Problem List: Decreased strength;Decreased activity tolerance;Impaired balance (sitting and/or standing)      OT Treatment/Interventions:      OT Goals(Current goals can be found in the care plan section) Acute Rehab OT Goals Patient Stated Goal: go home tomorrow, quit drinking  OT Frequency:     Barriers to D/C:            Co-evaluation              AM-PAC OT "6 Clicks" Daily Activity     Outcome Measure Help from another person eating meals?: None Help from another person taking care of personal grooming?: A Little Help from another person toileting, which includes using toliet, bedpan, or urinal?: A Little Help from another person bathing (including washing, rinsing, drying)?: None Help from another person to put on and taking off regular upper body clothing?: None Help from another person to put on and taking off regular lower body clothing?: None 6 Click Score: 22   End of Session Equipment Utilized During Treatment: Gait belt;Rolling walker Nurse Communication: Mobility status;Other (comment) (HR)  Activity Tolerance: Patient tolerated treatment well Patient left: in chair;with call bell/phone within reach;with chair alarm set  OT Visit Diagnosis: Unsteadiness on feet (R26.81);Other abnormalities of gait and mobility (R26.89);History of falling (Z91.81)                Time: 2824-1753 OT Time Calculation (min): 25 min Charges:  OT General Charges $OT Visit: 1 Visit OT Evaluation $OT Eval Moderate Complexity: 1 Mod OT Treatments $Therapeutic Activity: 8-22 mins  Layla Maw,  OTR/L  Layla Maw 10/31/2019, 11:38 AM

## 2019-10-31 NOTE — Progress Notes (Addendum)
Physical Therapy Treatment Patient Details Name: Fernando [name] MRN: [id number] DOB: [birth year] Today's Date: 10/31/2019    History of Present Illness 64 y/o male with history of alcohol abuse (half gallon of vodka daily for several years; no hx of seizures or intubations), tobacco abuse, HTN, anxiety, arthritis, gout, COVID in April, and recent hospitalization in [place] 6 weeks prior for pneumonia  presented to the ER on 10/25/19 with a chief complaint of weakness and dyspnea after making an attempt to quit drinking alcohol. He was admitted for a COPD exacerbation. CT chest negative for PE. Placed on CIWA protocol with increasing tachycardia and tremulousness. Admitted to ICU for precedex.      PT Comments    Patient was received sitting up in recliner chair and looking forward to going on his fourth walk today. Resting HR was 118. Monitored HR and SpO2 throughout session. SpO2 remained WNL. Patient provided with min guard assist x 1 for transfers and gait training. Patient ambulated 200' w/ RW, max HR reaching 135. After treatment session, pt was left in chair with call bell within reach and chair alarm set.     Follow Up Recommendations  Home health PT     Equipment Recommendations  Rolling walker with 5" wheels    Recommendations for Other Services       Precautions / Restrictions Precautions Precautions: Fall Precaution Comments: watch HR and O2 Restrictions Weight Bearing Restrictions: No    Mobility  Bed Mobility               General bed mobility comments: up in the chair on arrival  Transfers Overall transfer level: Needs assistance Equipment used: Rolling walker (2 wheeled) Transfers: Sit to/from Stand Sit to Stand: Supervision Stand pivot transfers: Supervision       General transfer comment: Supervision for transfers. Did not need verbal or tactile cueing for transfers.  Ambulation/Gait Ambulation/Gait assistance: Min guard Gait Distance (Feet): 200  Feet Assistive device: Rolling walker (2 wheeled) Gait Pattern/deviations: Step-through pattern;Decreased stride length;Trunk flexed Gait velocity: decreased   General Gait Details: slow, steady gait pattern. HR monitored up to 135 upon ambulation   Stairs             Wheelchair Mobility    Modified Rankin (Stroke Patients Only)       Balance Overall balance assessment: Needs assistance Sitting-balance support: No upper extremity supported;Feet unsupported Sitting balance-Leahy Scale: Good     Standing balance support: During functional activity;Bilateral upper extremity supported Standing balance-Leahy Scale: Good Standing balance comment: able to stand and transfer unsupported. Maintained balance during dynamic activities.             High level balance activites: Backward walking;Head turns;Turns High Level Balance Comments: Maintained balance with horizontal head turns, increase/decrease in gait speed, turning in hallway, and walking backwards while navigating through room.            Cognition Arousal/Alertness: Awake/alert Behavior During Therapy: WFL for tasks assessed/performed Overall Cognitive Status: Within Functional Limits for tasks assessed Area of Impairment: Safety/judgement                   Current Attention Level: Divided   Following Commands: Follows multi-step commands consistently Safety/Judgement: Decreased awareness of deficits Awareness: Anticipatory   General Comments: A&Ox4. Appropriate responses pleasant to work with. Eager to walk and participate in PT. Stated he had gone on 3 weeks prior to PT today and was looking forward to going home. HR elevated during session  Exercises      General Comments General comments (skin integrity, edema, etc.): HR up to 135 during mobility in hallway. Told us he knows to walk slowly to monitor HR.      Pertinent Vitals/Pain Pain Assessment: No/denies pain    Home Living  Family/patient expects to be discharged to:: Private residence Living Arrangements: Alone Available Help at Discharge: Friend(s);Available 24 hours/day (girlfriend lives next door) Type of Home: House Home Access: Stairs to enter   Home Layout: Two level;Able to live on main level with bedroom/bathroom Home Equipment: Cane - single point;Crutches;Shower seat - built in      Prior Function Level of Independence: Independent      Comments: Independent with ADLs, IADLs and mobility without AD   PT Goals (current goals can now be found in the care plan section) Acute Rehab PT Goals Patient Stated Goal: go home tomorrow, quit drinking PT Goal Formulation: With patient Time For Goal Achievement: 11/10/19 Potential to Achieve Goals: Good Progress towards PT goals: Progressing toward goals    Frequency    Min 3X/week      PT Plan Discharge plan needs to be updated    Co-evaluation              AM-PAC PT "6 Clicks" Mobility   Outcome Measure  Help needed turning from your back to your side while in a flat bed without using bedrails?: None Help needed moving from lying on your back to sitting on the side of a flat bed without using bedrails?: None Help needed moving to and from a bed to a chair (including a wheelchair)?: A Little Help needed standing up from a chair using your arms (e.g., wheelchair or bedside chair)?: A Little Help needed to walk in hospital room?: A Little Help needed climbing 3-5 steps with a railing? : A Little 6 Click Score: 20    End of Session Equipment Utilized During Treatment: Gait belt Activity Tolerance: Patient tolerated treatment well Patient left: in chair;with call bell/phone within reach;with chair alarm set   PT Visit Diagnosis: Unsteadiness on feet (R26.81);Difficulty in walking, not elsewhere classified (R26.2)     Time:  -     Charges:                        Livingston Diones, SPT, ATC

## 2019-10-31 NOTE — Consult Note (Signed)
Cardiology Consultation:   Patient ID: Fernando [name] MRN: [id number]; DOB: 1956-03-08  Admit date: 10/24/2019 Date of Consult: 10/31/2019  Primary Care Provider: [name], MD [facility] Cardiologist: Buford Dresser, MD (new) [place] Electrophysiologist:  None   Patient Profile:   Fernando [name] is a 64 y.o. male with a hx of alcohol abuse, tobacco abuse, ?HTN/HLD (pt denies although med list includes rx), gout, arthritis, anxiety, Covid-19 infection in 06/2019 who is being seen today for the evaluation of cardiomyopathy at the request of Dr. Elsworth Soho.  History of Present Illness:   He has no prior cardiac history. He has a history of excessive alcohol, drinking 1/2 gallon of vodka daily (1.5 gallons of [employer] lasts him 3 days). He was reportedly recently hospitalized for 6 weeks, unclear if this was related to reported prior diagnosis of Covid in 06/2019 - no records available.   He presented to [facility] with generalized weakness for 5 days. In the ED he was found to be tachycardic and hypoxic requiring supplemental O2. Labs revealed macrocytic anemia, thrombocytopenia, abnormal liver function, ammonia level of 48, hsTroponin 18->18, UDS negative, Covid-19 negative. CT angio showed no PE, + diffuse bilateral bronchial wall thickening and interlobular septal thickening, consistent with mild edema, mosaic attenuation of airspace diseases suggestive of small airways disease, suspected underlying fibrotic lung disease, cardiomegaly and coronary artery disease. During admission he has been noted to have acute alcohol withdrawal/DTs including hallucinations, fall while in the hospital, agitation, trying to exit the bed. He has been managed by both IM and PCCM and treated with withdrawal protocol, Precedex, clonidine, steroids, and IV diuretics. Other issues this admission include prolonged QTc, abnormal TSH and hypomagnesemia. While he was acutely delirious he also had  tachycardia in the 180s, difficult to discern whether this was sinus tach or SVT (does not appear to be fib/flutter). 2D echo 10/27/19 showed EF 25-30%, global hypokinesis, grade 1 DD, mildly reduced RV function, mild MR, dilated IVC -> unable to exclude LV thrombus. Repeat limited echo showed apical filling defect that was not clearly linear and may represent thrombus - can consider cMRI to further evaluate.   He is A+Ox3 today on exam and reports he's feeling so much better. He indicates he plans to be done with alcohol completely. He has been drinking this much for 6 years and just hasn't felt well overall. He denies any chest pain. His SOB is much improved. No h/o syncope.    Past Medical History:  Diagnosis Date  . Arthritis   . Cataract   . History of kidney stones   . Hyperlipidemia   . Hypertension   . Pneumonia     Past Surgical History:  Procedure Laterality Date  . COLONOSCOPY    . HERNIA REPAIR  1990  . KNEE SURGERY  1975   left men. repair  . REVERSE SHOULDER ARTHROPLASTY Right 06/21/2018   Procedure: REVERSE SHOULDER ARTHROPLASTY;  Surgeon: Hiram Gash, MD;  Location: [facility];  Service: Orthopedics;  Laterality: Right;  . ROTATOR CUFF REPAIR     right  . TONSILLECTOMY    . UMBILICAL HERNIA REPAIR       Home Medications:  Prior to Admission medications   Medication Sig Start Date End Date Taking? Authorizing Provider  allopurinol (ZYLOPRIM) 100 MG tablet Take 200 mg by mouth daily.  08/09/18  Yes [provider]  Multiple Vitamin (MULTIVITAMIN) tablet Take 1 tablet by mouth once a week.   Yes [provider]  telmisartan (MICARDIS) 80 MG tablet Take 80 mg by mouth daily.    Yes [provider]  betamethasone dipropionate (DIPROLENE) 0.05 % cream Apply topically 2 (two) times daily. Patient not taking: Reported on 10/25/2019 09/04/18   Nance Callas, NP  hydrOXYzine (ATARAX/VISTARIL) 10 MG tablet Take 1 tablet (10 mg total) by mouth 3  (three) times daily as needed for itching. Patient not taking: Reported on 09/04/2018 08/21/18    Callas, NP    Inpatient Medications: Scheduled Meds: . allopurinol  200 mg Oral Daily  . carvedilol  3.125 mg Oral BID WC  . Chlorhexidine Gluconate Cloth  6 each Topical Q0600  . fluticasone  1 spray Each Nare Daily  . folic acid  1 mg Oral Daily  . heparin injection (subcutaneous)  5,000 Units Subcutaneous Q8H  . mouth rinse  15 mL Mouth Rinse BID  . multivitamin  1 tablet Oral Daily  . nicotine  14 mg Transdermal Daily  . predniSONE  20 mg Oral Q breakfast  . thiamine  100 mg Oral Daily   Continuous Infusions:  PRN Meds: acetaminophen **OR** acetaminophen, ipratropium-albuterol, LORazepam, melatonin, sodium chloride  Allergies:   No Known Allergies  Social History:   Social History   Socioeconomic History  . Marital status: Significant Other    Spouse name: Not on file  . Number of children: Not on file  . Years of education: Not on file  . Highest education level: Not on file  Occupational History  . Not on file  Tobacco Use  . Smoking status: Current Every Day Smoker    Packs/day: 0.50    Types: Cigarettes  . Smokeless tobacco: Never Used  Vaping Use  . Vaping Use: Never used  Substance and Sexual Activity  . Alcohol use: Yes    Alcohol/week: 13.0 - 20.0 standard drinks    Types: 6 Cans of beer, 7 - 14 Standard drinks or equivalent per week  . Drug use: No  . Sexual activity: Not on file  Other Topics Concern  . Not on file  Social History Narrative  . Not on file   Social Determinants of Health   Financial Resource Strain:   . Difficulty of Paying Living Expenses:   Food Insecurity:   . Worried About Charity fundraiser in the Last Year:   . Arboriculturist in the Last Year:   Transportation Needs:   . Film/video editor (Medical):   Marland Kitchen Lack of Transportation (Non-Medical):   Physical Activity:   . Days of Exercise per Week:   . Minutes  of Exercise per Session:   Stress:   . Feeling of Stress :   Social Connections:   . Frequency of Communication with Friends and Family:   . Frequency of Social Gatherings with Friends and Family:   . Attends Religious Services:   . Active Member of Clubs or Organizations:   . Attends Archivist Meetings:   Marland Kitchen Marital Status:   Intimate Partner Violence:   . Fear of Current or Ex-Partner:   . Emotionally Abused:   Marland Kitchen Physically Abused:   . Sexually Abused:     Family History:   Family History  Problem Relation Age of Onset  . Lung cancer Father   . Heart failure Mother   . Colon cancer Neg Hx   . Esophageal cancer Neg Hx   . Stomach cancer Neg Hx   . Liver cancer Neg Hx   .  Pancreatic cancer Neg Hx   . Rectal cancer Neg Hx      ROS:  Please see the history of present illness.  All other ROS reviewed and negative.     Physical Exam/Data:   Vitals:   10/31/19 1209 10/31/19 1300 10/31/19 1331 10/31/19 1618  BP:   126/77 119/68  Pulse: (!) 135  (!) 110 (!) 109  Resp:  18 20 20   Temp:    (!) 97.5 F (36.4 C)  TempSrc:      SpO2:   94% 94%  Weight:      Height:        Intake/Output Summary (Last 24 hours) at 10/31/2019 1722 Last data filed at 10/31/2019 0800 Gross per 24 hour  Intake 240 ml  Output 225 ml  Net 15 ml   Last 3 Weights 10/29/2019 10/28/2019 10/28/2019  Weight (lbs) 163 lb 5.8 oz 163 lb 2.3 oz 163 lb 2.3 oz  Weight (kg) 74.1 kg 74 kg 74 kg     Body mass index is 25.59 kg/m.  General: Well developed WM, in no acute distress. Head: Normocephalic, atraumatic, sclera non-icteric, no xanthomas, nares are without discharge. Neck: Negative for carotid bruits. JVP not elevated. Lungs: Coarse crackles R lung, otherwise no wheezes or rhonchi.  Breathing is unlabored, now off supplemental O2 Heart: RRR S1 S2 without murmurs, rubs, or gallops.  Abdomen: Soft, non-tender, non-distended with normoactive bowel sounds. No rebound/guarding. Extremities: No  clubbing or cyanosis. No edema. Distal pedal pulses are 2+ and equal bilaterally. Hyperpigmentation of lower extremities noted with scaly thick skin Neuro: Alert and oriented X 3. Moves all extremities spontaneously. Psych:  Responds to questions appropriately with a normal affect.   EKG:  The EKG was personally reviewed and demonstrates:  All tracings reviewed. Initial tracing shows 10/24/19 showed sinus tach with RBBB and nonspecific TW changes. 10/25/19 shows narrow complex tachycardia at 129bpm c/w known baseline RBBB morphology, cannot exclude atrial tachycardia 10/25/19 shows narrow complex tachycardia with HR 181bpm, appears sinus tach vs SVT Most recent tacing today appears to be sinus tach 110bpm RBBB with nonspecific TW changes   Telemetry:  Telemetry was personally reviewed and  demonstrates: currently sinus tach low 100s  Relevant CV Studies: Full echo 10/27/19 1. Suspect calcified apical false tendon vs organized thrombus at the  apex. Left ventricular ejection fraction, by estimation, is 25 to 30%. The  left ventricle has severely decreased function. The left ventricle  demonstrates global hypokinesis. There is  mild left ventricular hypertrophy. Left ventricular diastolic parameters  are consistent with Grade I diastolic dysfunction (impaired relaxation).  Elevated left ventricular end-diastolic pressure. There is incoordinate  septal motion.  2. Right ventricular systolic function is mildly reduced. The right  ventricular size is normal. Tricuspid regurgitation signal is inadequate  for assessing PA pressure.  3. The mitral valve is abnormal. Mild mitral valve regurgitation.  4. The aortic valve is tricuspid. Aortic valve regurgitation is not  visualized.  5. The inferior vena cava is dilated in size with <50% respiratory  variability, suggesting right atrial pressure of 15 mmHg.   Conclusion(s)/Recommendation(s): Unable to exclude left ventricular  thrombus,  although if present, appears organized/calcified. Would  recommend a repeat transthoracic echocardiogram with contrast. D/w  sonographer.   Limited echo 10/27/19 1. There is an apical filling defect that is not clearly linear and may  represent thrombus. Based on non-contrast images, this appears organized.  Consider additional imaging such as cMRI at some point to further  evaluate.. Left ventricular ejection  fraction, by estimation, is 25 to 30%. The left ventricle has severely  decreased function. The left ventricle demonstrates global hypokinesis.   Laboratory Data:  High Sensitivity Troponin:   Recent Labs  Lab 10/24/19 1530 10/24/19 2130  TROPONINIHS 18* 18*     Chemistry Recent Labs  Lab 10/27/19 0621 10/29/19 0206 10/30/19 0259  NA 138 137 137  K 3.8 3.3* 3.6  CL 101 101 100  CO2 24 26 25   GLUCOSE 109* 120* 111*  BUN 24* 21 19  CREATININE 0.81 0.88 0.90  CALCIUM 9.1 8.7* 9.1  GFRNONAA >60 >60 >60  GFRAA >60 >60 >60  ANIONGAP 13 10 12     Recent Labs  Lab 10/25/19 1240 10/26/19 0324 10/29/19 0206  PROT 7.0 7.1 5.8*  ALBUMIN 3.7 3.5 2.7*  AST 117* 176* 51*  ALT 80* 95* 79*  ALKPHOS 139* 145* 104  BILITOT 2.8* 4.1* 1.7*   Hematology Recent Labs  Lab 10/26/19 0324 10/27/19 0621 10/29/19 0206  WBC 4.6 6.2 5.4  RBC 3.34* 3.28* 2.78*  HGB 13.8 13.7 11.4*  HCT 40.0 39.0 32.7*  MCV 119.8* 118.9* 117.6*  MCH 41.3* 41.8* 41.0*  MCHC 34.5 35.1 34.9  RDW 13.1 12.8 12.5  PLT 60* 52* 65*   BNP Recent Labs  Lab 10/25/19 1240  BNP 349.4*    DDimer No results for input(s): DDIMER in the last 168 hours.   Radiology/Studies:  DG Chest 2 View  Result Date: 10/31/2019 CLINICAL DATA:  Pneumonitis EXAM: CHEST - 2 VIEW COMPARISON:  10/27/2019 FINDINGS: Patchy bilateral lower lobe airspace opacities are slightly improved since prior study, particularly at the right base. Heart is normal size. No effusions or acute bony abnormality. IMPRESSION: Bibasilar  opacities have improved since prior study, particularly at the right base. A component of the residual opacity is likely related to chronic lung disease/fibrosis. Electronically Signed   By: Rolm Baptise M.D.   On: 10/31/2019 08:13   CT HEAD WO CONTRAST  Result Date: 10/28/2019 CLINICAL DATA:  Recent fall with increased confusion EXAM: CT HEAD WITHOUT CONTRAST TECHNIQUE: Contiguous axial images were obtained from the base of the skull through the vertex without intravenous contrast. COMPARISON:  None. FINDINGS: Brain: Mild atrophic changes are noted. No findings to suggest acute hemorrhage, acute infarction or space-occupying mass lesion are seen. Mild chronic white matter ischemic changes seen as well. Lacunar infarcts are noted in the basal ganglia inferiorly bilaterally. Vascular: No hyperdense vessel or unexpected calcification. Skull: Normal. Negative for fracture or focal lesion. Sinuses/Orbits: No acute finding. Other: None. IMPRESSION: Chronic atrophic and ischemic changes without acute abnormality. Electronically Signed   By: Inez Catalina M.D.   On: 10/28/2019 22:10   DG Pelvis Portable  Result Date: 10/28/2019 CLINICAL DATA:  Pain status post fall. EXAM: PORTABLE PELVIS 1-2 VIEWS COMPARISON:  None. FINDINGS: There is no evidence of pelvic fracture or diastasis. No pelvic bone lesions are seen. IMPRESSION: Negative. Electronically Signed   By: Constance Holster M.D.   On: 10/28/2019 21:19   ECHOCARDIOGRAM LIMITED  Result Date: 10/27/2019    ECHOCARDIOGRAM LIMITED REPORT   Patient Name:   MARVON SHILLINGBURG Date of Exam: 10/27/2019 Medical Rec #:  [id number]      Height:       67.0 in Accession #:    [id number]     Weight:       159.2 lb Date of Birth:  1955/08/13      BSA:  1.835 m Patient Age:    43 years       BP:           95/63 mmHg Patient Gender: M              HR:           85 bpm. Exam Location:  Inpatient Procedure: Saline Contrast Bubble Study and 2D Echo Indications:    LV thrombus   History:        Patient has prior history of Echocardiogram examinations, most                 recent 10/27/2019. Risk Factors:Hypertension and Current Smoker.                 Alcholic Hepatitis, Alcohol withdrawl, Acute hypoxemic                 respiratory failure.  Sonographer:    Clayton Lefort RDCS (AE) Referring Phys: [id number] KENNETH C HILTY IMPRESSIONS  1. There is an apical filling defect that is not clearly linear and may represent thrombus. Based on non-contrast images, this appears organized. Consider additional imaging such as cMRI at some point to further evaluate.. Left ventricular ejection fraction, by estimation, is 25 to 30%. The left ventricle has severely decreased function. The left ventricle demonstrates global hypokinesis. FINDINGS  Left Ventricle: There is an apical filling defect that is not clearly linear and may represent thrombus. Based on non-contrast images, this appears organized. Consider additional imaging such as cMRI at some point to further evaluate. Left ventricular ejection fraction, by estimation, is 25 to 30%. The left ventricle has severely decreased function. The left ventricle demonstrates global hypokinesis. Definity contrast agent was given IV to delineate the left ventricular endocardial borders. Lyman Bishop MD Electronically signed by Lyman Bishop MD Signature Date/Time: 10/27/2019/5:35:18 PM    Final    New York Heart Association (NYHA) Functional Class NYHA Class II  Assessment and Plan:   1. Acute hypoxic respiratory failure  -  diffuse bilateral bronchial wall thickening and interlobular septal thickening on CT chest, PCCM has followed for potential steroid responsive ILD - plan is for OP f/u in 4-6 weeks with eventual high-res CT if infiltrates persist - now on RA  2. Delirium tremens with severe alcohol abuse - A+Ox3 on exam today - CIWA protocol, now off precedex, clonidine stopped - will need social work resources at discharge to help abstain as he  seems committed to being done with alcohol - abd [place] c/w hepatic steatosis, but also noted to have thrombocytopenia on labs as well  3. Acute systolic CHF - LVEF newly depressed without prior baseline - suspect at least due in part to severe alcohol use, cannot fully exclude underlying CAD - given his acute issues, heavy ETOH use and thrombocytopenia, patient is not ideal candidate for invasive evaluation at this time but certainly needs close cardiology follow-up for CHF med titration and assessment of alcohol abstinence to consider ischemic workup going forward  - continue carvedilol for now - watch BPs off clonidine with an eye towards ARB (given hypotension this admission may not be great candidate for Entresto) - appears euvolemic today so no standing diuretic needed  4. LV thrombus - will review echo result and imaging plans with MD  5. Tachycardia - potential atrial flutter noted briefly on 7/22 in context of acute withdrawal, also some SVT - regardless this was transient and appears to have resolved back to a baseline  sinus tachycardia - given transient nature, no OAC planned for this specifically - continue to treat underlying issues and watch HR/BP on carvedilol  6. Prolonged QTc in context of metabolic derangement - improved, 432ms on telemetry - lyte management per primary team  For questions or updates, please contact [place] Please consult [email or web address] for contact info under    Signed, Charlie Pitter, PA-C  10/31/2019 5:22 PM

## 2019-10-31 NOTE — Telephone Encounter (Signed)
-----   Message from Rigoberto Noel, MD sent at 10/31/2019  4:16 PM EDT ----- Please make follow-up appointment with APP/VS in 6 weeks  RA

## 2019-10-31 NOTE — Progress Notes (Addendum)
   NAME:  Fernando [name], MRN:  [id number], DOB:  [birth year], LOS: 6 ADMISSION DATE:  10/24/2019, CONSULTATION DATE:  10/26/2019 REFERRING MD:  Dr. Tonie Griffith, CHIEF COMPLAINT:  ETOH withdrawal  Brief History   64 year old male admitted 7/22 presenting with generalized weakness admitted for alcohol withdrawal, hypoxemia, and elevated liver enzymes.  Patient continued high CIWA scores despite 29 mg Ativan since admit.  Past Medical History  Alcohol abuse Tobacco abuse HTN Gout Arthritis Anxiety   Significant Hospital Events   7/22 Admit to [facility] 7/26 off precedex 7/27 transfer to telemetry  Consults:    Procedures:   Significant Diagnostic Tests:  7/22 CTA PE >> 1. Negative examination for pulmonary embolism. 2. Diffuse bilateral bronchial wall thickening and interlobular septal thickening, consistent with mild edema. 3. Mosaic attenuation of the airspaces throughout, suggestive of small airways disease. 4. Suspected there may be a component of underlying fibrotic interstitial lung disease. Consider nonemergent ILD protocol CT to further evaluate at the resolution of acute clinical symptoms. 5. Cardiomegaly and coronary artery disease.  7/22 RUQ [place] >> 1. No acute abnormality. 2. Hepatic steatosis.   Micro Data:  7/22 SARS 2 >> neg  Antimicrobials:   Interim history/subjective:   Sitting in a chair out of bed, wife at bedside No chest pain or dyspnea No cough or wheezing  Objective   Blood pressure 126/77, pulse (!) 110, temperature 98.1 F (36.7 C), resp. rate 20, height 5\' 7"  (1.702 m), weight 74.1 kg, SpO2 94 %.        Intake/Output Summary (Last 24 hours) at 10/31/2019 1610 Last data filed at 10/31/2019 0800 Gross per 24 hour  Intake 360 ml  Output 525 ml  Net -165 ml   Filed Weights   10/28/19 0053 10/28/19 0115 10/29/19 0348  Weight: 74 kg 74 kg 74.1 kg    Examination:  General - alert , interactive Eyes - pupils reactive ENT - no sinus  tenderness, no stridor Cardiac - regular, tachycardic Chest -right basal crackles, no rhonchi Abdomen - soft, non tender, + bowel sounds Extremities - no cyanosis, clubbing, or edema Skin - no rashes Neuro - normal strength, moves extremities, nonfocal Psych - normal mood and behavior  Resolved Hospital Problem list   Elevated LFTs  Assessment & Plan:   Delirium tremens. - off precedex 7/26 - CIWA q4h with prn ativan for CIWA > 8 - dc clonidine    Acute hypoxic respiratory failure with  diffuse bilateral bronchial wall thickening and interlobular septal thickening on CT chest. ?  Steroid responsive ILD Tobacco abuse -Chest x-ray shows improved bibasilar infiltrates -Given empiric steroids, not clear if infiltrates responded to steroids Taper prednisone over 2 weeks -Outpatient follow-up in 4 to 6 weeks with chest x-ray and eventually may need high-resolution CT if infiltrates persist  Acute systolic CHF. Possible apical thrombus.  Prolonged QTc. Hx of hypertension. -Coreg added, awaiting cardiology consultation   PCCM will be available as needed  Kara Mead MD. FCCP. [facility]  If no response to pager , please call [phone]   10/31/2019

## 2019-10-31 NOTE — TOC Initial Note (Signed)
Transition of Care [facility]) - Initial/Assessment Note    Patient Details  Name: Fernando [name] MRN: [id number] Date of Birth: 01/13/1956  [facility]) CM/SW Contact:    Carles Collet, RN Phone Number: 10/31/2019, 10:17 AM  Clinical Narrative:              Damaris Schooner w patient at bedside. Discussed dispo options. He would like to go home w [facility] services w [facility], familiar with them from previous use. He would like RW  Order for RW placed and requested to be delivered to room. Patient will need order for [facility] PT OT. Referral placed.      Expected Discharge Plan: [place] Barriers to Discharge: Continued Medical Work up   Patient Goals and CMS Choice Patient states their goals for this hospitalization and ongoing recovery are:: wants to go home CMS Medicare.gov Compare Post Acute Care list provided to:: Patient Choice offered to / list presented to : Patient  Expected Discharge Plan and Services Expected Discharge Plan: [place]   Discharge Planning Services: CM Consult                     DME Arranged: Gilford Rile DME Agency: [employer] Date DME Agency Contacted: 10/31/19 Time DME Agency Contacted: 89 Representative spoke with at DME Agency: Thedore Mins HH Arranged: PT, OT [place] Agency: [place] ([place]) Date [place]: 10/31/19 Time [place]: 53 Representative spoke with at [place]: Butch Penny  Prior Living Arrangements/Services   Lives with:: Self                   Activities of Daily Living Home Assistive Devices/Equipment: None ADL Screening (condition at time of admission) Patient's cognitive ability adequate to safely complete daily activities?: Yes Is the patient deaf or have difficulty hearing?: No Does the patient have difficulty seeing, even when wearing glasses/contacts?: No Does the patient have difficulty concentrating, remembering, or making decisions?: No Patient able to express need  for assistance with ADLs?: No Does the patient have difficulty dressing or bathing?: No Independently performs ADLs?: Yes (appropriate for developmental age) Does the patient have difficulty walking or climbing stairs?: No Weakness of Legs: Right Weakness of Arms/Hands: Left  Permission Sought/Granted                  Emotional Assessment              Admission diagnosis:  Alcohol withdrawal ([place]) [F10.239] Chronic alcoholism ([place]) [F10.20] Gait disturbance [V42.5] Alcoholic liver disease ([place]) [K70.9] Hyperammonemia ([place]) [E72.20] Thrombocytopenia ([place]) [D69.6] Hypoxia [R09.02] Elevated liver enzymes [R74.8] Generalized weakness [R53.1] Alcohol withdrawal syndrome without complication ([place]) [Z56.387] Patient Active Problem List   Diagnosis Date Noted  . Generalized weakness 10/25/2019  . Alcoholic hepatitis 56/43/3295  . Sinus tachycardia 10/25/2019  . Hyperammonemia ([place]) 10/25/2019  . Alcohol withdrawal ([place]) 10/25/2019  . Acute hypoxemic respiratory failure ([place]) 10/25/2019  . Macrocytic anemia 10/25/2019  . Thrombocytopenia ([place]) 10/25/2019  . Hypertension   . Gout   . Tobacco abuse   . Stasis dermatitis of both legs 09/04/2018  . Medication monitoring encounter 08/09/2018  . 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 Rotator cuff tear arthropathy, right 06/21/2018   PCP:  [name], MD Pharmacy:   [id number] [place], [place] [address] [place] [address] Phone: [phone] Fax: [phone]     Social Determinants of Health (SDOH)  Interventions    Readmission Risk Interventions No flowsheet data found.

## 2019-10-31 NOTE — Progress Notes (Signed)
PROGRESS NOTE  Fernando [name]  DOB: 21-Aug-1955  PCP: [name], MD HCW:[id number]  DOA: 10/24/2019  LOS: 6 days   Chief Complaint  Patient presents with  . Shortness of Breath   Brief narrative: Fernando [name] is a 64 y.o. male with PMH of chronic smoking, chronic alcoholism, hypertension, gout, arthritis, anxiety Patient presented to the ED on 10/24/2019 with generalized weakness. In the ED, using alcohol withdrawal, hypoxic.  Labs showed elevated liver enzymes. He was admitted to ICU on Precedex drip.  Off Precedex drip on 7/26 With clinical improvement, transfer out of ICU on 7/27.  Subjective: Patient was seen and examined this morning.  Presented to ED Caucasian male.  Sitting up in chair.  Ambulated on the hallway with physical therapy. Chart reviewed. Seems to be mostly tachycardic between 100 and 110. Magnesium 1.7  Assessment/Plan: Delirium tremens. -Initially admitted to ICU on Precedex drip, off precedex 7/26 -Continue CIWA q4h with prn ativan for CIWA > 8 -Also on clonidine 0.1 mg bid -continue thiamine, folic acid, MVI  Acute hypoxic respiratory failure Possible underlying fibrotic interstitial lung disease Chronic everyday smoker -CT chest on admission showed diffuse bilateral bronchial wall thickening and interlobular septal thickening, raising suspicion of underlying fibrotic interstitial lung disease -Pulmonary started the patient on prednisone 20 mg daily.  To gradually taper off. -Nicotine patch. -Bronchodilators as needed. -Required supplemental oxygen on admission.  Currently breathing on room air   Acute systolic CHF. Possible apical thrombus.  Hx of hypertension -Echocardiogram 7/24 showed EF of 25 to 30%, global hypokinesis and also an apical filling defect which may represent thrombus. -All cardiac medicines in his home list was telmisartan 80 mg daily. -Currently on clonidine 0.1 mg twice daily for alcohol withdrawal. -7/27, patient was  also started on Coreg 3.125 mg twice daily for tachycardia. -Obtain cardiology consultation. -monitor on telemetry  Prolonged QTC -EKG from 7/23 showed QTC prolonged to 547 ms. -This morning, potassium 3.6 and magnesium 1.7. -Replace. Recheck. -Repeat EKG today showed QTC improved to 473 ms.  Macrocytic anemia Thrombocytopenia. -Secondary to ETOH - f/u CBC intermittently  Gout -On allopurinol 200 mg daily  Elevated TSH. -TSH 7.1 on 7/22.  Will need f/u TSH as outpt when stable  Physical deconditioning. -PT/OT eval obtained. -Home with PT recommended.  Mobility: PT following Code Status:   Code Status: Full Code  Nutritional status: Body mass index is 25.59 kg/m.     Diet Order            Diet regular Room service appropriate? Yes with Assist; Fluid consistency: Thin  Diet effective now                 DVT prophylaxis: heparin injection 5,000 Units Start: 10/28/19 1700 SCDs Start: 10/25/19 1241   Antimicrobials:  None Fluid: None  Consultants: PCCM, cardiology Family Communication:  None at bedside  Status is: Inpatient  Remains inpatient appropriate because:Ongoing diagnostic testing needed not appropriate for outpatient work up   Dispo: The patient is from: Home              Anticipated d/c is to: Home with PT              Anticipated d/c date is: 2 days              Patient currently is not medically stable to d/c.       Infusions:    Scheduled Meds: . allopurinol  200 mg Oral Daily  .  carvedilol  3.125 mg Oral BID WC  . Chlorhexidine Gluconate Cloth  6 each Topical Q0600  . cloNIDine  0.1 mg Oral BID  . fluticasone  1 spray Each Nare Daily  . folic acid  1 mg Oral Daily  . heparin injection (subcutaneous)  5,000 Units Subcutaneous Q8H  . mouth rinse  15 mL Mouth Rinse BID  . multivitamin  1 tablet Oral Daily  . nicotine  14 mg Transdermal Daily  . predniSONE  20 mg Oral Q breakfast  . thiamine  100 mg Oral Daily     Antimicrobials: Anti-infectives (From admission, onward)   None      PRN meds: acetaminophen **OR** acetaminophen, ipratropium-albuterol, LORazepam, melatonin, sodium chloride   Objective: Vitals:   10/31/19 0515 10/31/19 0802  BP: 126/70 97/69  Pulse: (!) 108 (!) 115  Resp:  20  Temp: 98.2 F (36.8 C) 98.1 F (36.7 C)  SpO2: 93% 94%    Intake/Output Summary (Last 24 hours) at 10/31/2019 0838 Last data filed at 10/31/2019 0230 Gross per 24 hour  Intake 480 ml  Output 525 ml  Net -45 ml   Filed Weights   10/28/19 0053 10/28/19 0115 10/29/19 0348  Weight: 74 kg 74 kg 74.1 kg   Weight change:  Body mass index is 25.59 kg/m.   Physical Exam: General exam: Appears calm and comfortable.  Not in physical distress Skin: No rashes, lesions or ulcers. HEENT: Atraumatic, normocephalic, supple neck, no obvious bleeding Lungs: Clear to auscultation bilaterally CVS: Regular rate and rhythm, no murmur GI/Abd soft, nontender, nondistended, bowel sound present CNS: Alert, awake and oriented x3 Psychiatry: Mood appropriate Extremities: No pedal edema, no calf tenderness  Data Review: I have personally reviewed the laboratory data and studies available.  Recent Labs  Lab 10/24/19 1530 10/25/19 1240 10/26/19 0324 10/27/19 0621 10/29/19 0206  WBC 4.8 4.8 4.6 6.2 5.4  NEUTROABS  --  3.8  --   --  4.0  HGB 13.2 13.4 13.8 13.7 11.4*  HCT 38.4* 38.8* 40.0 39.0 32.7*  MCV 116.7* 119.8* 119.8* 118.9* 117.6*  PLT 62* 57* 60* 52* 65*   Recent Labs  Lab 10/25/19 1240 10/26/19 0324 10/26/19 1259 10/27/19 0621 10/29/19 0206 10/30/19 0259  NA 138 136  --  138 137 137  K 4.0 3.7  --  3.8 3.3* 3.6  CL 100 99  --  101 101 100  CO2 25 20*  --  24 26 25   GLUCOSE 94 176*  --  109* 120* 111*  BUN 10 12  --  24* 21 19  CREATININE 0.92 0.99  --  0.81 0.88 0.90  CALCIUM 9.0 9.1  --  9.1 8.7* 9.1  MG 1.6*  --  1.4* 2.0 1.8 1.7  PHOS 3.3  --  3.3 3.1  --   --    No results  found for: HGBA1C     Component Value Date/Time   CHOL  07/16/2008 1914    92 (NOTE) ATP III Classification:      < 200        mg/dL        Desirable     200 - 239     mg/dL        Borderline High     >= 240        mg/dL        High    TRIG 258 (H) 07/26/2008 0315    Signed, Terrilee Croak, MD [facility] Pager:  [phone] (Secure Chat preferred). 10/31/2019

## 2019-11-01 DIAGNOSIS — D696 Thrombocytopenia, unspecified: Secondary | ICD-10-CM | POA: Diagnosis not present

## 2019-11-01 DIAGNOSIS — I429 Cardiomyopathy, unspecified: Secondary | ICD-10-CM | POA: Diagnosis not present

## 2019-11-01 DIAGNOSIS — R748 Abnormal levels of other serum enzymes: Secondary | ICD-10-CM

## 2019-11-01 DIAGNOSIS — J9601 Acute respiratory failure with hypoxia: Secondary | ICD-10-CM | POA: Diagnosis not present

## 2019-11-01 DIAGNOSIS — R000 Tachycardia, unspecified: Secondary | ICD-10-CM | POA: Diagnosis not present

## 2019-11-01 LAB — CBC WITH DIFFERENTIAL/PLATELET
Abs Immature Granulocytes: 0.04 10*3/uL (ref 0.00–0.07)
Basophils Absolute: 0 10*3/uL (ref 0.0–0.1)
Basophils Relative: 0 %
Eosinophils Absolute: 0.1 10*3/uL (ref 0.0–0.5)
Eosinophils Relative: 1 %
HCT: 31.9 % — ABNORMAL LOW (ref 39.0–52.0)
Hemoglobin: 10.9 g/dL — ABNORMAL LOW (ref 13.0–17.0)
Immature Granulocytes: 1 %
Lymphocytes Relative: 21 %
Lymphs Abs: 1 10*3/uL (ref 0.7–4.0)
MCH: 40.5 pg — ABNORMAL HIGH (ref 26.0–34.0)
MCHC: 34.2 g/dL (ref 30.0–36.0)
MCV: 118.6 fL — ABNORMAL HIGH (ref 80.0–100.0)
Monocytes Absolute: 1 10*3/uL (ref 0.1–1.0)
Monocytes Relative: 19 %
Neutro Abs: 2.9 10*3/uL (ref 1.7–7.7)
Neutrophils Relative %: 58 %
Platelets: 121 10*3/uL — ABNORMAL LOW (ref 150–400)
RBC: 2.69 MIL/uL — ABNORMAL LOW (ref 4.22–5.81)
RDW: 12.7 % (ref 11.5–15.5)
WBC: 5 10*3/uL (ref 4.0–10.5)
nRBC: 0 % (ref 0.0–0.2)

## 2019-11-01 LAB — BASIC METABOLIC PANEL
Anion gap: 10 (ref 5–15)
BUN: 12 mg/dL (ref 8–23)
CO2: 23 mmol/L (ref 22–32)
Calcium: 9 mg/dL (ref 8.9–10.3)
Chloride: 104 mmol/L (ref 98–111)
Creatinine, Ser: 0.73 mg/dL (ref 0.61–1.24)
GFR calc Af Amer: >60 mL/min (ref >60)
GFR calc non Af Amer: >60 mL/min (ref >60)
Glucose, Bld: 106 mg/dL — ABNORMAL HIGH (ref 70–99)
Potassium: 3.6 mmol/L (ref 3.5–5.1)
Sodium: 137 mmol/L (ref 135–145)

## 2019-11-01 LAB — MAGNESIUM: Magnesium: 1.6 mg/dL — ABNORMAL LOW (ref 1.7–2.4)

## 2019-11-01 LAB — PHOSPHORUS: Phosphorus: 3.3 mg/dL (ref 2.5–4.6)

## 2019-11-01 MED ORDER — THIAMINE HCL 100 MG PO TABS: 100.0000 mg | ORAL_TABLET | Freq: Every day | ORAL | 0 refills | Status: AC

## 2019-11-01 MED ORDER — CARVEDILOL 3.125 MG PO TABS: 3.1250 mg | ORAL_TABLET | Freq: Two times a day (BID) | ORAL | 0 refills | Status: DC

## 2019-11-01 MED ORDER — FOLIC ACID 1 MG PO TABS: 1.0000 mg | ORAL_TABLET | Freq: Every day | ORAL | 0 refills | Status: AC

## 2019-11-01 MED ORDER — PREDNISONE 10 MG PO TABS
ORAL_TABLET | ORAL | 0 refills | Status: DC
Start: 2019-11-01 — End: 2019-11-16

## 2019-11-01 NOTE — TOC Transition Note (Signed)
Transition of Care [facility]) - CM/SW Discharge Note   Patient Details  Name: VISHAAL STROLLO MRN: [id number] Date of Birth: September 14, 1955  Transition of Care [facility]) CM/SW Contact:  Emeterio Reeve, [place] Phone Number: 11/01/2019, 1:21 PM   Clinical Narrative:     Pt will d/c back home with [facility] services through [facility]. Pt has received walker through [employer].   Final next level of care: [place] Barriers to Discharge: Barriers Resolved   Patient Goals and CMS Choice Patient states their goals for this hospitalization and ongoing recovery are:: To go home CMS Medicare.gov Compare Post Acute Care list provided to:: Patient Choice offered to / list presented to : Patient  Discharge Placement                    Patient and family notified of of transfer: 11/01/19  Discharge Plan and Services   Discharge Planning Services: CM Consult            DME Arranged: 3-N-1 DME Agency: [employer] Date DME Agency Contacted: 10/31/19 Time DME Agency Contacted: [phone] Representative spoke with at DME Agency: [place]: PT, OT [place] Agency: [place] ([place]) Date [place]: 10/31/19 Time [place]: 1017 Representative spoke with at [place]: [place] ([place]) Interventions     Readmission Risk Interventions No flowsheet data found.   Emeterio Reeve, Latanya Presser, [place] Social Worker [phone]

## 2019-11-01 NOTE — Progress Notes (Signed)
Progress Note  Patient Name: Fernando [name] Date of Encounter: 11/01/2019  [facility] Cardiologist: Buford Dresser, MD   Subjective   No acute events overnight. Denies chest pain and shortness of breath. Reviewed management options. He would like to go home on current meds today if possible and follow up as an outpatient.  Inpatient Medications    Scheduled Meds: . allopurinol  200 mg Oral Daily  . carvedilol  3.125 mg Oral BID WC  . Chlorhexidine Gluconate Cloth  6 each Topical Q0600  . fluticasone  1 spray Each Nare Daily  . folic acid  1 mg Oral Daily  . heparin injection (subcutaneous)  5,000 Units Subcutaneous Q8H  . mouth rinse  15 mL Mouth Rinse BID  . multivitamin  1 tablet Oral Daily  . nicotine  14 mg Transdermal Daily  . predniSONE  20 mg Oral Q breakfast  . thiamine  100 mg Oral Daily   Continuous Infusions:  PRN Meds: acetaminophen **OR** acetaminophen, ipratropium-albuterol, LORazepam, melatonin, sodium chloride   Vital Signs    Vitals:   10/31/19 1331 10/31/19 1618 10/31/19 2235 11/01/19 0744  BP: 126/77 119/68 (!) 111/62 117/70  Pulse: (!) 110 (!) 109 102 103  Resp: 20 20 19 16   Temp:  (!) 97.5 F (36.4 C) 98.4 F (36.9 C)   TempSrc:      SpO2: 94% 94% 90% 95%  Weight:      Height:        Intake/Output Summary (Last 24 hours) at 11/01/2019 1029 Last data filed at 11/01/2019 0800 Gross per 24 hour  Intake 240 ml  Output --  Net 240 ml   Last 3 Weights 10/29/2019 10/28/2019 10/28/2019  Weight (lbs) 163 lb 5.8 oz 163 lb 2.3 oz 163 lb 2.3 oz  Weight (kg) 74.1 kg 74 kg 74 kg      Telemetry    Sinus rhythm/sinus tachycardia with rare PVCs - Personally Reviewed  ECG    Sinus tach, RBBB - Personally Reviewed  Physical Exam   GEN: No acute distress.   Neck: No JVD sitting upright Cardiac: RRR, no murmurs, rubs, or gallops.  Respiratory: Coarse R>L lung but no wheezing or rales appreciated GI: Soft, nontender, non-distended  MS:  No edema; No deformity. Neuro:  Nonfocal  Psych: Normal affect   Labs    High Sensitivity Troponin:   Recent Labs  Lab 10/24/19 1530 10/24/19 2130  TROPONINIHS 18* 18*      Chemistry Recent Labs  Lab 10/25/19 1240 10/25/19 1240 10/26/19 0324 10/27/19 0621 10/29/19 0206 10/30/19 0259 11/01/19 0119  NA 138   < > 136   < > 137 137 137  K 4.0   < > 3.7   < > 3.3* 3.6 3.6  CL 100   < > 99   < > 101 100 104  CO2 25   < > 20*   < > 26 25 23   GLUCOSE 94   < > 176*   < > 120* 111* 106*  BUN 10   < > 12   < > 21 19 12   CREATININE 0.92   < > 0.99   < > 0.88 0.90 0.73  CALCIUM 9.0   < > 9.1   < > 8.7* 9.1 9.0  PROT 7.0  --  7.1  --  5.8*  --   --   ALBUMIN 3.7  --  3.5  --  2.7*  --   --  AST 117*  --  176*  --  51*  --   --   ALT 80*  --  95*  --  79*  --   --   ALKPHOS 139*  --  145*  --  104  --   --   BILITOT 2.8*  --  4.1*  --  1.7*  --   --   GFRNONAA >60   < > >60   < > >60 >60 >60  GFRAA >60   < > >60   < > >60 >60 >60  ANIONGAP 13   < > 17*   < > 10 12 10    < > = values in this interval not displayed.     Hematology Recent Labs  Lab 10/27/19 0621 10/29/19 0206 11/01/19 0119  WBC 6.2 5.4 5.0  RBC 3.28* 2.78* 2.69*  HGB 13.7 11.4* 10.9*  HCT 39.0 32.7* 31.9*  MCV 118.9* 117.6* 118.6*  MCH 41.8* 41.0* 40.5*  MCHC 35.1 34.9 34.2  RDW 12.8 12.5 12.7  PLT 52* 65* 121*    BNP Recent Labs  Lab 10/25/19 1240  BNP 349.4*     DDimer No results for input(s): DDIMER in the last 168 hours.   Radiology    DG Chest 2 View  Result Date: 10/31/2019 CLINICAL DATA:  Pneumonitis EXAM: CHEST - 2 VIEW COMPARISON:  10/27/2019 FINDINGS: Patchy bilateral lower lobe airspace opacities are slightly improved since prior study, particularly at the right base. Heart is normal size. No effusions or acute bony abnormality. IMPRESSION: Bibasilar opacities have improved since prior study, particularly at the right base. A component of the residual opacity is likely related to  chronic lung disease/fibrosis. Electronically Signed   By: Rolm Baptise M.D.   On: 10/31/2019 08:13    Cardiac Studies   Full echo 10/27/19 1. Suspect calcified apical false tendon vs organized thrombus at the  apex. Left ventricular ejection fraction, by estimation, is 25 to 30%. The  left ventricle has severely decreased function. The left ventricle  demonstrates global hypokinesis. There is  mild left ventricular hypertrophy. Left ventricular diastolic parameters  are consistent with Grade I diastolic dysfunction (impaired relaxation).  Elevated left ventricular end-diastolic pressure. There is incoordinate  septal motion.  2. Right ventricular systolic function is mildly reduced. The right  ventricular size is normal. Tricuspid regurgitation signal is inadequate  for assessing PA pressure.  3. The mitral valve is abnormal. Mild mitral valve regurgitation.  4. The aortic valve is tricuspid. Aortic valve regurgitation is not  visualized.  5. The inferior vena cava is dilated in size with <50% respiratory  variability, suggesting right atrial pressure of 15 mmHg.   Conclusion(s)/Recommendation(s): Unable to exclude left ventricular  thrombus, although if present, appears organized/calcified. Would  recommend a repeat transthoracic echocardiogram with contrast. D/w  sonographer.   Limited echo 10/27/19 1. There is an apical filling defect that is not clearly linear and may  represent thrombus. Based on non-contrast images, this appears organized.  Consider additional imaging such as cMRI at some point to further  evaluate.. Left ventricular ejection  fraction, by estimation, is 25 to 30%. The left ventricle has severely  decreased function. The left ventricle demonstrates global hypokinesis.   Patient Profile     64 y.o. male with PMH alcohol abuse, tobacco abuse who is seen in consultation at the request of Dr. Pietro Cassis for new cardiomyopathy.  Assessment & Plan    New  cardiomyopathy:  -high suspicion for  alcohol induced -platelets improved today. Discussed ischemic workup. He would like to pursue as an outpatient -tolerating low dose carvedilol, with occasional blood pressures into 64P systolic. Will not uptitrate today -consider ARB attempt as an outpatient if BP allows. Do not think he will tolerate entresto at this time. -appears euvolemic. Counseled on what to watch for in terms of heart failure symptoms. -no statin given LFTs abnormalities, though these are slowly improving. Follow up as outpatient -no aspirin given prior significant thrombocytopenia this admission  Possible LV thrombus:  -atypical images, but cannot exclude even with definity contrast -as noted yesterday, he is high risk for anticoagulation with thrombocytopenia (though this is improved today) and suspected coagulopathy due to alcohol abuse. Coumadin has potential for harm and would require very close follow up as well. -he will work on alcohol abstinence, and we can reimage as an outpatient  Overall we discussed that his heavy alcohol use is high risk for progressive heart failure. He understands this and is focused on abstinence. Difficult situation, complex medical decision making, but ultimately we need to do no harm, and there are significant risks to treatment as above.  [facility] will sign off.   Medication Recommendations:  Continue carvedilol. Do not restart ARB at this time, will re-evaluate as an outpatient. Other recommendations (labs, testing, etc):  none Follow up as an outpatient:  We will arrange for him to follow up with me as an outpatient.  For questions or updates, please contact [place] Please consult [email or web address] for contact info under     Signed, Buford Dresser, MD  11/01/2019, 10:29 AM

## 2019-11-01 NOTE — Discharge Summary (Signed)
Physician Discharge Summary  Fernando [name] SHF:[id number] DOB: [birth year] DOA: 10/24/2019  PCP: [name], MD  Admit date: 10/24/2019 Discharge date: 11/01/2019  Admitted From: Home Discharge disposition: Home health PT/RN   Code Status: Full Code  Diet Recommendation: Cardiac diet   Recommendations for Outpatient Follow-Up:   1. Follow-up with PCP as an outpatient 2. Follow-up with cardiology as an outpatient  Discharge Diagnosis:   Principal Problem:   Acute hypoxemic respiratory failure (HCC) Active Problems:   Generalized weakness   Alcoholic hepatitis   Sinus tachycardia   Hyperammonemia (HCC)   Alcohol withdrawal (HCC)   Macrocytic anemia   Thrombocytopenia (HCC)   Hypertension   Gout   Tobacco abuse   Pneumonitis   History of Present Illness / Brief narrative:  Fernando [name] is a 64 y.o. male with PMH of chronic smoking, chronic alcoholism, hypertension, gout, arthritis, anxiety Patient presented to the ED on 10/24/2019 with generalized weakness. In the ED, using alcohol withdrawal, hypoxic.  Labs showed elevated liver enzymes. He was admitted to ICU on Precedex drip.  Off Precedex drip on 7/26 With clinical improvement, transfer out of ICU on 7/27.  Hospital Course:  Delirium tremens. -Initially admitted to ICU on Precedex drip, off precedex 7/26 -Patient did not have withdrawal symptoms in the hospital.  IV Ativan and oral clonidine were used.   -continue thiamine, folic acid, MVI  Acute hypoxic respiratory failure Possible underlying fibrotic interstitial lung disease -responding to steroids Chronic everyday smoker -CT chest on admission showed diffuse bilateral bronchial wall thickening and interlobular septal thickening, raising suspicion of underlying fibrotic interstitial lung disease -Pulmonary started the patient on prednisone 20 mg daily.  To gradually taper off for 2 weeks. -Nicotine patch. -Bronchodilators as needed. -Required  supplemental oxygen on admission.  Currently breathing on room air  -Outpatient follow-up in 4 to 6 weeks with pulmonary for chest x-ray, may also need high-resolution CT if infiltrates persist.  Acute systolic CHF. Possible apical thrombus.  Hx of hypertension -Echocardiogram 7/24 showed EF of 25 to 30%, global hypokinesis and also an apical filling defect which may represent thrombus. -Cardiology consultation obtained. -Patient ideally would need right and left heart catheterization to find out coronary anatomy as well as rule out apical thrombus. However because of his chronic alcoholism and low platelets.  He is also not a candidate for anticoagulation because of the same. -Currently on Coreg 3.125 mg twice daily.  Because of borderline low blood pressure, Entresto/ARB could not be added.  Patient appears euvolemic, no diuretics needed. -Patient will follow up with cardiology as an outpatient.  Prolonged QTC -EKG from 7/23 showed QTC prolonged to 547 ms. -With potassium and magnesium replacement, QTC improved to 473 ms on 7/28.  Macrocytic anemia Thrombocytopenia. -Secondary to ETOH - f/u CBC intermittently  Gout -On allopurinol 200 mg daily  Elevated TSH. -TSH 7.1 on 7/22.  Will need f/u TSH as outpt when stable  Physical deconditioning. -PT/OT eval obtained. -Home with PT recommended.  Mobility: PT following Code Status:  Code Status: Full Code   Wound care:    Subjective:  Seen and examined this morning. Sitting up in chair.  Ready to go home. We discussed about the need to monitor him for longer because of the risk of rebound hypertension from clonidine withdrawal. Patient is determined to go home today.  He agrees to be monitored till this afternoon  Discharge Exam:   Vitals:   10/31/19 1331 10/31/19 1618 10/31/19 2235 11/01/19 5027  BP: 126/77 119/68 (!) 111/62 117/70  Pulse: (!) 110 (!) 109 102 103  Resp: 20 20 19 16   Temp:  (!) 97.5 F (36.4 C)  98.4 F (36.9 C)   TempSrc:      SpO2: 94% 94% 90% 95%  Weight:      Height:        Body mass index is 25.59 kg/m.  General exam: Appears calm and comfortable.  Not in physical distress Skin: No rashes, lesions or ulcers. HEENT: Atraumatic, normocephalic, supple neck, no obvious bleeding Lungs: Clear to auscultation bilaterally CVS: Regular rate and rhythm, no murmur GI/Abd soft, nontender, nondistended, bowel sound present CNS: Alert, awake, oriented x3, no alcohol related tremors Psychiatry: Mood appropriate Extremities: No pedal edema, no calf tenderness  Discharge Instructions:  Stop alcohol stop smoking Recommend compliance with follow-ups and medications as advised. Discharge Instructions    Increase activity slowly   Complete by: As directed       [place], [facility] Follow up.   Why: For home health services. They will contact you in 1-2 days to set up [facility] services Contact information: [place] [address] [phone]        [name], MD Follow up.   Specialty: Internal Medicine Contact information: [place] [address] [phone]        Buford Dresser, MD .   Specialty: Cardiology Contact information: [address] [place] [address] [phone]              Allergies as of 11/01/2019   No Known Allergies     Medication List    STOP taking these medications   betamethasone dipropionate 0.05 % cream   hydrOXYzine 10 MG tablet Commonly known as: ATARAX/VISTARIL   telmisartan 80 MG tablet Commonly known as: MICARDIS     TAKE these medications   allopurinol 100 MG tablet Commonly known as: ZYLOPRIM Take 200 mg by mouth daily.   carvedilol 3.125 MG tablet Commonly known as: COREG Take 1 tablet (3.125 mg total) by mouth 2 (two) times daily with a meal.   folic acid 1 MG tablet Commonly known as: FOLVITE Take 1  tablet (1 mg total) by mouth daily. Start taking on: November 02, 2019   multivitamin tablet Take 1 tablet by mouth once a week.   predniSONE 10 MG tablet Commonly known as: DELTASONE Take take 2 tabs daily for 7 days, then take 1 tab daily for 7 days, then STOP.   thiamine 100 MG tablet Take 1 tablet (100 mg total) by mouth daily. Start taking on: November 02, 2019            Durable Medical Equipment  (From admission, onward)         Start     Ordered   10/31/19 1023  For home use only DME Walker rolling  Once       Question Answer Comment  Walker: With 5 Inch Wheels   Patient needs a walker to treat with the following condition Weakness      10/31/19 1023          Time coordinating discharge: 35 minutes  The results of significant diagnostics from this hospitalization (including imaging, microbiology, ancillary and laboratory) are listed below for reference.    Procedures and Diagnostic Studies:   DG Chest 2 View  Result Date: 10/24/2019 CLINICAL DATA:  Shortness of breath EXAM: CHEST -  2 VIEW COMPARISON:  Radiograph 08/05/2008, CT 07/27/2008 FINDINGS: There are diffusely coarsened interstitial changes and areas of reticulation in the lung bases which may reflect areas of scarring from prior infection or inflammation seen on comparison exams. No focal consolidative opacity. No pneumothorax or effusion. The aorta is calcified. The remaining cardiomediastinal contours are unremarkable. No acute osseous or soft tissue abnormality. Degenerative changes are present in the imaged spine and left shoulder with reverse right shoulder arthroplasty. IMPRESSION: Coarsened interstitial changes and areas of reticulation in the lung bases may reflect areas of scarring from prior infection or inflammation. No acute cardiopulmonary abnormality. Electronically Signed   By: Lovena Le M.D.   On: 10/24/2019 16:14   CT Angio Chest PE W and/or Wo Contrast  Result Date: 10/25/2019 CLINICAL  DATA:  PE suspected EXAM: CT ANGIOGRAPHY CHEST WITH CONTRAST TECHNIQUE: Multidetector CT imaging of the chest was performed using the standard protocol during bolus administration of intravenous contrast. Multiplanar CT image reconstructions and MIPs were obtained to evaluate the vascular anatomy. CONTRAST:  44mL OMNIPAQUE IOHEXOL 350 MG/ML SOLN COMPARISON:  None. FINDINGS: Cardiovascular: Satisfactory opacification of the pulmonary arteries to the segmental level. No evidence of pulmonary embolism. Cardiomegaly. Three-vessel coronary artery calcifications. No pericardial effusion. Mediastinum/Nodes: No enlarged mediastinal, hilar, or axillary lymph nodes. Thyroid gland, trachea, and esophagus demonstrate no significant findings. Lungs/Pleura: Examination of the lung parenchyma is somewhat limited by breath motion artifact. Within this limitation, there is diffuse bilateral bronchial wall thickening and interlobular septal thickening. There is dependent bibasilar partial atelectasis and/or fibrotic change. Mosaic attenuation of the airspaces throughout. No pleural effusion or pneumothorax. Upper Abdomen: No acute abnormality. Musculoskeletal: No chest wall abnormality. No acute or significant osseous findings. Review of the MIP images confirms the above findings. IMPRESSION: 1. Negative examination for pulmonary embolism. 2. Diffuse bilateral bronchial wall thickening and interlobular septal thickening, consistent with mild edema. 3. Mosaic attenuation of the airspaces throughout, suggestive of small airways disease. 4. Suspected there may be a component of underlying fibrotic interstitial lung disease. Consider nonemergent ILD protocol CT to further evaluate at the resolution of acute clinical symptoms. 5. Cardiomegaly and coronary artery disease. Electronically Signed   By: Eddie Candle M.D.   On: 10/25/2019 11:48   US Abdomen Limited RUQ  Result Date: 10/25/2019 CLINICAL DATA:  Elevated LFTs. EXAM: ULTRASOUND  ABDOMEN LIMITED RIGHT UPPER QUADRANT COMPARISON:  Abdominal ultrasound dated August 01, 2008. FINDINGS: Gallbladder: No gallstones or wall thickening visualized. No sonographic Murphy sign noted by sonographer. Common bile duct: Diameter: 3 mm, normal. Liver: No focal lesion identified. Diffusely increased in parenchymal echogenicity. Portal vein is patent on color Doppler imaging with normal direction of blood flow towards the liver. Other: None. IMPRESSION: 1. No acute abnormality. 2. Hepatic steatosis. Electronically Signed   By: Titus Dubin M.D.   On: 10/25/2019 15:12     Labs:   Basic Metabolic Panel: Recent Labs  Lab 10/25/19 1240 10/25/19 1240 10/26/19 0324 10/26/19 0324 10/26/19 1259 10/27/19 1191 10/27/19 0621 10/29/19 0206 10/29/19 0206 10/30/19 0259 11/01/19 0119  NA 138   < > 136  --   --  138  --  137  --  137 137  K 4.0   < > 3.7   < >  --  3.8   < > 3.3*   < > 3.6 3.6  CL 100   < > 99  --   --  101  --  101  --  100 104  CO2 25   < > 20*  --   --  24  --  26  --  25 23  GLUCOSE 94   < > 176*  --   --  109*  --  120*  --  111* 106*  BUN 10   < > 12  --   --  24*  --  21  --  19 12  CREATININE 0.92   < > 0.99  --   --  0.81  --  0.88  --  0.90 0.73  CALCIUM 9.0   < > 9.1  --   --  9.1  --  8.7*  --  9.1 9.0  MG 1.6*   < >  --   --  1.4* 2.0  --  1.8  --  1.7 1.6*  PHOS 3.3  --   --   --  3.3 3.1  --   --   --   --  3.3   < > = values in this interval not displayed.   GFR Estimated Creatinine Clearance: 84.9 mL/min (by C-G formula based on SCr of 0.73 mg/dL). Liver Function Tests: Recent Labs  Lab 10/25/19 1240 10/26/19 0324 10/29/19 0206  AST 117* 176* 51*  ALT 80* 95* 79*  ALKPHOS 139* 145* 104  BILITOT 2.8* 4.1* 1.7*  PROT 7.0 7.1 5.8*  ALBUMIN 3.7 3.5 2.7*   No results for input(s): LIPASE, AMYLASE in the last 168 hours. Recent Labs  Lab 10/26/19 0324  AMMONIA 35   Coagulation profile Recent Labs  Lab 10/25/19 1240  INR 1.0     CBC: Recent Labs  Lab 10/25/19 1240 10/26/19 0324 10/27/19 0621 10/29/19 0206 11/01/19 0119  WBC 4.8 4.6 6.2 5.4 5.0  NEUTROABS 3.8  --   --  4.0 2.9  HGB 13.4 13.8 13.7 11.4* 10.9*  HCT 38.8* 40.0 39.0 32.7* 31.9*  MCV 119.8* 119.8* 118.9* 117.6* 118.6*  PLT 57* 60* 52* 65* 121*   Cardiac Enzymes: No results for input(s): CKTOTAL, CKMB, CKMBINDEX, TROPONINI in the last 168 hours. BNP: Invalid input(s): POCBNP CBG: Recent Labs  Lab 10/29/19 0003 10/29/19 0351 10/29/19 1958 10/29/19 2231 10/30/19 0802  GLUCAP 129* 123* 98 112* 100*   D-Dimer No results for input(s): DDIMER in the last 72 hours. Hgb A1c No results for input(s): HGBA1C in the last 72 hours. Lipid Profile No results for input(s): CHOL, HDL, LDLCALC, TRIG, CHOLHDL, LDLDIRECT in the last 72 hours. Thyroid function studies No results for input(s): TSH, T4TOTAL, T3FREE, THYROIDAB in the last 72 hours.  Invalid input(s): FREET3 Anemia work up No results for input(s): VITAMINB12, FOLATE, FERRITIN, TIBC, IRON, RETICCTPCT in the last 72 hours. Microbiology Recent Results (from the past 240 hour(s))  SARS Coronavirus 2 by RT PCR (hospital order, performed in [facility] hospital lab) Nasopharyngeal Nasopharyngeal Swab     Status: None   Collection Time: 10/25/19 10:27 AM   Specimen: Nasopharyngeal Swab  Result Value Ref Range Status   SARS Coronavirus 2 NEGATIVE NEGATIVE Final    Comment: (NOTE) SARS-CoV-2 target nucleic acids are NOT DETECTED.  The SARS-CoV-2 RNA is generally detectable in upper and lower respiratory specimens during the acute phase of infection. The lowest concentration of SARS-CoV-2 viral copies this assay can detect is 250 copies / mL. A negative result does not preclude SARS-CoV-2 infection and should not be used as the sole basis for treatment or other patient management decisions.  A negative result may occur with  improper specimen collection / handling, submission of specimen  other than nasopharyngeal swab, presence of viral mutation(s) within the areas targeted by this assay, and inadequate number of viral copies (<250 copies / mL). A negative result must be combined with clinical observations, patient history, and epidemiological information.  Fact Sheet for Patients:   StrictlyIdeas.no  Fact Sheet for Healthcare Providers: BankingDealers.co.za  This test is not yet approved or  cleared by the [place] FDA and has been authorized for detection and/or diagnosis of SARS-CoV-2 by FDA under an Emergency Use Authorization (EUA).  This EUA will remain in effect (meaning this test can be used) for the duration of the COVID-19 declaration under Section 564(b)(1) of the Act, 21 U.S.C. section 360bbb-3(b)(1), unless the authorization is terminated or revoked sooner.  Performed at [facility], [place] [address], [place], [place] [address]   MRSA PCR Screening     Status: None   Collection Time: 10/26/19 10:22 PM   Specimen: Nasal Mucosa; Nasopharyngeal  Result Value Ref Range Status   MRSA by PCR NEGATIVE NEGATIVE Final    Comment:        The GeneXpert MRSA Assay (FDA approved for NASAL specimens only), is one component of a comprehensive MRSA colonization surveillance program. It is not intended to diagnose MRSA infection nor to guide or monitor treatment for MRSA infections. Performed at [facility], [place] [address] [place], [place] [address]     Please note: You were cared for by a hospitalist during your hospital stay. Once you are discharged, your primary care physician will handle any further medical issues. Please note that NO REFILLS for any discharge medications will be authorized once you are discharged, as it is imperative that you return to your primary care physician (or establish a relationship with a primary care physician if you do not have one) for your post hospital discharge  needs so that they can reassess your need for medications and monitor your lab values.  Signed: Terrilee Croak  [facility] 11/01/2019, 12:07 PM

## 2019-11-01 NOTE — TOC CAGE-AID Note (Signed)
Transition of Care [facility]) - CAGE-AID Screening   Patient Details  Name: Fernando [name] MRN: [id number] Date of Birth: June 03, 1955  Transition of Care [facility]) CM/SW Contact:    Emeterio Reeve, [place] Phone Number: 11/01/2019, 1:24 PM   Clinical Narrative:  Pt reports he has not had any alcohol since 7/19. Pt reports prior to that he was drinking daily. Pt reports he has made the decision to stop and that he will be going to [place] meetings at his church in [place]. Pt stated that he knows his alcohol se contributed to him being in the hospital and he wants to be healthier. Pt denied substance use. Pt denied resources and Scientist, clinical (histocompatibility and immunogenetics).   CAGE-AID Screening:    Have You Ever Felt You Ought to Cut Down on Your Drinking or Drug Use?: Yes Have People Annoyed You By Critizing Your Drinking Or Drug Use?: Yes Have You Felt Bad Or Guilty About Your Drinking Or Drug Use?: Yes      Substance Abuse Education Offered: Yes     Blima Ledger, [place] Social Worker [phone]

## 2019-11-01 NOTE — Discharge Instructions (Signed)
Alcohol Abuse and Dependence Information, Adult Alcohol is a widely available drug. People drink alcohol in different amounts. People who drink alcohol very often and in large amounts often have problems during and after drinking. They may develop what is called an alcohol use disorder. There are two main types of alcohol use disorders:  Alcohol abuse. This is when you use alcohol too much or too often. You may use alcohol to make yourself feel happy or to reduce stress. You may have a hard time setting a limit on the amount you drink.  Alcohol dependence. This is when you use alcohol consistently for a period of time, and your body changes as a result. This can make it hard to stop drinking because you may start to feel sick or feel different when you do not use alcohol. These symptoms are known as withdrawal. How can alcohol abuse and dependence affect me? Alcohol abuse and dependence can have a negative effect on your life. Drinking too much can lead to addiction. You may feel like you need alcohol to function normally. You may drink alcohol before work in the morning, during the day, or as soon as you get home from work in the evening. These actions can result in:  Poor work performance.  Job loss.  Financial problems.  Car crashes or criminal charges from driving after drinking alcohol.  Problems in your relationships with friends and family.  Losing the trust and respect of coworkers, friends, and family. Drinking heavily over a long period of time can permanently damage your body and brain, and can cause lifelong health issues, such as:  Damage to your liver or pancreas.  Heart problems, high blood pressure, or stroke.  Certain cancers.  Decreased ability to fight infections.  Brain or nerve damage.  Depression.  Early (premature) death. If you are careless or you crave alcohol, it is easy to drink more than your body can handle (overdose). Alcohol overdose is a serious  situation that requires hospitalization. It may lead to permanent injuries or death. What can increase my risk?  Having a family history of alcohol abuse.  Having depression or other mental health conditions.  Beginning to drink at an early age.  Binge drinking often.  Experiencing trauma, stress, and an unstable home life during childhood.  Spending time with people who drink often. What actions can I take to prevent or manage alcohol abuse and dependence?  Do not drink alcohol if: ? Your health care provider tells you not to drink. ? You are pregnant, may be pregnant, or are planning to become pregnant.  If you drink alcohol: ? Limit how much you use to:  0-1 drink a day for women.  0-2 drinks a day for men. ? Be aware of how much alcohol is in your drink. In the [place] one drink equals one 12 oz bottle of beer (355 mL), one 5 oz glass of wine (148 mL), or one 1 oz glass of hard liquor (44 mL).  Stop drinking if you have been drinking too much. This can be very hard to do if you are used to abusing alcohol. If you begin to have withdrawal symptoms, talk with your health care provider or a person that you trust. These symptoms may include anxiety, shaky hands, headache, nausea, sweating, or not being able to sleep.  Choose to drink nonalcoholic beverages in social gatherings and places where there may be alcohol. Activity  Spend more time on activities that you enjoy that do   not involve alcohol, like hobbies or exercise.  Find healthy ways to cope with stress, such as exercise, meditation, or spending time with people you care about. General information  Talk to your family, coworkers, and friends about supporting you in your efforts to stop drinking. If they drink, ask them not to drink around you. Spend more time with people who do not drink alcohol.  If you think that you have an alcohol dependency problem: ? Tell friends or family about your concerns. ? Talk with your  health care provider or another health professional about where to get help. ? Work with a therapist and a chemical dependency counselor. ? Consider joining a support group for people who struggle with alcohol abuse and dependence. Where to find support   Your health care provider.  SMART Recovery: www.smartrecovery.org Therapy and support groups  Local treatment centers or chemical dependency counselors.  Local AA groups in your community: www.aa.org Where to find more information  Centers for Disease Control and Prevention: www.cdc.gov  National Institute on Alcohol Abuse and Alcoholism: www.niaaa.nih.gov  Alcoholics Anonymous (AA): www.aa.org Contact a health care provider if:  You drank more or for longer than you intended on more than one occasion.  You tried to stop drinking or to cut back on how much you drink, but you were not able to.  You often drink to the point of vomiting or passing out.  You want to drink so badly that you cannot think about anything else.  You have problems in your life due to drinking, but you continue to drink.  You keep drinking even though you feel anxious, depressed, or have experienced memory loss.  You have stopped doing the things you used to enjoy in order to drink.  You have to drink more than you used to in order to get the effect you want.  You experience anxiety, sweating, nausea, shakiness, and trouble sleeping when you try to stop drinking. Get help right away if:  You have thoughts about hurting yourself or others.  You have serious withdrawal symptoms, including: ? Confusion. ? Racing heart. ? High blood pressure. ? Fever. If you ever feel like you may hurt yourself or others, or have thoughts about taking your own life, get help right away. You can go to your nearest emergency department or call:  Your local emergency services (911 in the [place]  A suicide crisis helpline, such as the National Suicide Prevention  Lifeline at 1-800-273-8255. This is open 24 hours a day. Summary  Alcohol abuse and dependence can have a negative effect on your life. Drinking too much or too often can lead to addiction.  If you drink alcohol, limit how much you use.  If you are having trouble keeping your drinking under control, find ways to change your behavior. Hobbies, calming activities, exercise, or support groups can help.  If you feel you need help with changing your drinking habits, talk with your health care provider, a good friend, or a therapist, or go to an AA group. This information is not intended to replace advice given to you by your health care provider. Make sure you discuss any questions you have with your health care provider. Document Revised: 07/11/2018 Document Reviewed: 05/30/2018 Elsevier Patient Education  2020 [employer].  

## 2019-11-16 ENCOUNTER — Encounter: Payer: Self-pay | Admitting: Cardiology

## 2019-11-16 ENCOUNTER — Other Ambulatory Visit: Payer: Self-pay

## 2019-11-16 ENCOUNTER — Ambulatory Visit (INDEPENDENT_AMBULATORY_CARE_PROVIDER_SITE_OTHER): Payer: Medicare Other | Admitting: Cardiology

## 2019-11-16 VITALS — BP 110/74 | HR 83 | Temp 97.1°F | Ht 67.0 in | Wt 166.0 lb

## 2019-11-16 DIAGNOSIS — I429 Cardiomyopathy, unspecified: Secondary | ICD-10-CM

## 2019-11-16 DIAGNOSIS — D696 Thrombocytopenia, unspecified: Secondary | ICD-10-CM | POA: Diagnosis not present

## 2019-11-16 DIAGNOSIS — Z79899 Other long term (current) drug therapy: Secondary | ICD-10-CM

## 2019-11-16 DIAGNOSIS — D539 Nutritional anemia, unspecified: Secondary | ICD-10-CM

## 2019-11-16 DIAGNOSIS — Z7189 Other specified counseling: Secondary | ICD-10-CM

## 2019-11-16 MED ORDER — LOSARTAN POTASSIUM 25 MG PO TABS: 12.5000 mg | ORAL_TABLET | Freq: Every day | ORAL | 11 refills | Status: DC

## 2019-11-16 NOTE — Patient Instructions (Addendum)
Medication Instructions:  Start Losartan 12.5 mg daily  *If you need a refill on your cardiac medications before your next appointment, please call your pharmacy*   Lab Work: Your physician recommends that you return for lab work in 1 week ( BMP)    Testing/Procedures: None   Follow-Up: At [facility], you and your health needs are our priority.  As part of our continuing mission to provide you with exceptional heart care, we have created designated Provider Care Teams.  These Care Teams include your primary Cardiologist (physician) and Advanced Practice Providers (APPs -  Physician Assistants and Nurse Practitioners) who all work together to provide you with the care you need, when you need it.  We recommend signing up for the patient portal called "MyChart".  Sign up information is provided on this After Visit Summary.  MyChart is used to connect with patients for Virtual Visits (Telemedicine).  Patients are able to view lab/test results, encounter notes, upcoming appointments, etc.  Non-urgent messages can be sent to your provider as well.   To learn more about what you can do with MyChart, go to [email or web address].    Your next appointment:   3 month(s)  The format for your next appointment:   In Person  Provider:   Buford Dresser, MD

## 2019-11-16 NOTE — Progress Notes (Signed)
Cardiology Office Note:    Date:  11/16/2019   ID:  Fernando [name], DOB [birth year], MRN [id number]  PCP:  [name], MD  Cardiologist:  Buford Dresser, MD  Referring MD: [name], MD   CC: follow up  History of Present Illness:    Fernando [name] is a 64 y.o. male with a hx of cardiomyopathy diagnosed 10/2019 who is seen for post hospital follow up.  I met him during his recent hospitalization, discharge 11/01/19. See workup below.   Has completely stopped drinking and smoking. Congratulated on this.   PT comes twice a week, has his blood pressure checked at that time. Denies low blood pressures.   We again reviewed results of his inpatient testing, including reduced EF and possible LV thrombus. Reviewed additional evaluation to determine if this is ischemic vs nonischemic, if there is definitive thrombus.  After shared decision making, he would like to repeat echo in three months after good medical therapy and then pursue further evaluation if abnormal.  Denies chest pain, shortness of breath at rest or with normal exertion. No PND, orthopnea, LE edema or unexpected weight gain. No syncope or palpitations.  Past Medical History:  Diagnosis Date  . Arthritis   . Cataract   . History of kidney stones   . Hyperlipidemia   . Hypertension   . Pneumonia     Past Surgical History:  Procedure Laterality Date  . COLONOSCOPY    . HERNIA REPAIR  1990  . KNEE SURGERY  1975   left men. repair  . REVERSE SHOULDER ARTHROPLASTY Right 06/21/2018   Procedure: REVERSE SHOULDER ARTHROPLASTY;  Surgeon: Hiram Gash, MD;  Location: [facility];  Service: Orthopedics;  Laterality: Right;  . ROTATOR CUFF REPAIR     right  . TONSILLECTOMY    . UMBILICAL HERNIA REPAIR      Current Medications: Current Outpatient Medications on File Prior to Visit  Medication Sig  . allopurinol (ZYLOPRIM) 100 MG tablet Take 200 mg by mouth daily.   . carvedilol (COREG) 3.125 MG tablet Take  1 tablet (3.125 mg total) by mouth 2 (two) times daily with a meal.  . folic acid (FOLVITE) 1 MG tablet Take 1 tablet (1 mg total) by mouth daily.  . Multiple Vitamin (MULTIVITAMIN) tablet Take 1 tablet by mouth once a week.  . thiamine 100 MG tablet Take 1 tablet (100 mg total) by mouth daily.   No current facility-administered medications on file prior to visit.     Allergies:   Patient has no known allergies.   Social History   Tobacco Use  . Smoking status: Current Every Day Smoker    Packs/day: 0.50    Types: Cigarettes  . Smokeless tobacco: Never Used  Vaping Use  . Vaping Use: Never used  Substance Use Topics  . Alcohol use: Yes    Alcohol/week: 13.0 - 20.0 standard drinks    Types: 6 Cans of beer, 7 - 14 Standard drinks or equivalent per week  . Drug use: No    Family History: family history includes Heart failure in his mother; Lung cancer in his father. There is no history of Colon cancer, Esophageal cancer, Stomach cancer, Liver cancer, Pancreatic cancer, or Rectal cancer.  ROS:   Please see the history of present illness.  Additional pertinent ROS: Constitutional: Negative for chills, fever, night sweats, unintentional weight loss  HENT: Negative for ear pain and hearing loss.   Eyes: Negative for loss of vision and  eye pain.  Respiratory: Negative for cough, sputum, wheezing.   Cardiovascular: See HPI. Gastrointestinal: Negative for abdominal pain, melena, and hematochezia.  Genitourinary: Negative for dysuria and hematuria.  Musculoskeletal: Negative for falls and myalgias.  Skin: Negative for itching and rash.  Neurological: Negative for focal weakness, focal sensory changes and loss of consciousness.  Endo/Heme/Allergies: Does not bruise/bleed easily.     EKGs/Labs/Other Studies Reviewed:    The following studies were reviewed today: Full echo 10/27/19 1. Suspect calcified apical false tendon vs organized thrombus at the  apex. Left ventricular  ejection fraction, by estimation, is 25 to 30%. The  left ventricle has severely decreased function. The left ventricle  demonstrates global hypokinesis. There is  mild left ventricular hypertrophy. Left ventricular diastolic parameters  are consistent with Grade I diastolic dysfunction (impaired relaxation).  Elevated left ventricular end-diastolic pressure. There is incoordinate  septal motion.  2. Right ventricular systolic function is mildly reduced. The right  ventricular size is normal. Tricuspid regurgitation signal is inadequate  for assessing PA pressure.  3. The mitral valve is abnormal. Mild mitral valve regurgitation.  4. The aortic valve is tricuspid. Aortic valve regurgitation is not  visualized.  5. The inferior vena cava is dilated in size with <50% respiratory  variability, suggesting right atrial pressure of 15 mmHg.   Conclusion(s)/Recommendation(s): Unable to exclude left ventricular  thrombus, although if present, appears organized/calcified. Would  recommend a repeat transthoracic echocardiogram with contrast. D/w  sonographer.   Limited echo 10/27/19 1. There is an apical filling defect that is not clearly linear and may  represent thrombus. Based on non-contrast images, this appears organized.  Consider additional imaging such as cMRI at some point to further  evaluate.. Left ventricular ejection  fraction, by estimation, is 25 to 30%. The left ventricle has severely  decreased function. The left ventricle demonstrates global hypokinesis.   EKG:  EKG is personally reviewed.  The ekg ordered 10/31/19 demonstrates sinus tachycardia with RBBB  Recent Labs: 10/25/2019: B Natriuretic Peptide 349.4; TSH 7.096 10/29/2019: ALT 79 11/01/2019: BUN 12; Creatinine, Ser 0.73; Hemoglobin 10.9; Magnesium 1.6; Platelets 121; Potassium 3.6; Sodium 137  Recent Lipid Panel    Component Value Date/Time   CHOL  07/16/2008 1914    92 (NOTE) ATP III Classification:      <  200        mg/dL        Desirable     200 - 239     mg/dL        Borderline High     >= 240        mg/dL        High    TRIG 258 (H) 07/26/2008 0315    Physical Exam:    VS:  BP 110/74   Pulse 83   Temp (!) 97.1 F (36.2 C)   Ht _0  (1.702 m)   Wt 156 lb (70.8 kg)   SpO2 95%   BMI 24.43 kg/m     Wt Readings from Last 3 Encounters:  11/16/19 156 lb (70.8 kg)  10/29/19 163 lb 5.8 oz (74.1 kg)  09/04/18 172 lb (78 kg)    GEN: Well nourished, well developed in no acute distress HEENT: Normal, moist mucous membranes NECK: No JVD CARDIAC: regular rhythm, normal S1 and S2, no rubs or gallops. No murmurs. VASCULAR: Radial and DP pulses 2+ bilaterally. No carotid bruits RESPIRATORY:  Clear to auscultation without rales, wheezing or rhonchi  ABDOMEN: Soft,  non-tender, non-distended MUSCULOSKELETAL:  Ambulates independently SKIN: Warm and dry, no edema NEUROLOGIC:  Alert and oriented x 3. No focal neuro deficits noted. PSYCHIATRIC:  Normal affect    ASSESSMENT:    1. Cardiomyopathy, unspecified type ([place])   2. Medication management   3. Thrombocytopenia ([place])   4. Macrocytic anemia   5. Cardiac risk counseling   6. Counseling on health promotion and disease prevention    PLAN:    Cardiomyopathy: high suspicion for alcohol induced, and he is now completely abstinent -no inpatient cath as platelets were low -now that he is completely avoiding alcohol and tobacco, he would like to have echo rechecked prior to ischemic workup -tolerating only low dose carvedilol in the hospital -starting losartan 12.5 mg daily. Has home PT to check blood pressures -repeat BMET in 2 weeks for medication management  Possible LV thrombus:  -atypical images, but cannot exclude even with definity contrast -we discussed this at length during his hospitalization. With thrombocytopenia, macrocytic anemia, risk of anticoagulation is not trivial -discussed that testing was not definitive. Discussed  empiric anticoagulation vs. CMRI. Discussed recheck CBC -after shared decision making, he wishes to proceed with plan for repeat echocardiogram (would use echo contrast to evaluate LV)  -counseled on stroke symptoms, other symptoms that need immediate medical attention  Cardiac risk counseling and prevention recommendations: -recommend heart healthy/Mediterranean diet, with whole grains, fruits, vegetable, fish, lean meats, nuts, and olive oil. Limit salt. -recommend moderate walking, 3-5 times/week for 30-50 minutes each session. Aim for at least 150 minutes.week. Goal should be pace of 3 miles/hours, or walking 1.5 miles in 30 minutes -recommend avoidance of tobacco products. Avoid excess alcohol. -ASCVD risk score: The ASCVD Risk score Mikey Bussing DC Jr., et al., 2013) failed to calculate for the following reasons:   Cannot find a previous HDL lab   Cannot find a previous total cholesterol lab   -LFTS elevated during hospitalization. Would check lipids, LFTS at follow up  Plan for follow up: 3 mos, repeat echo prior  Buford Dresser, MD, PhD [place]  [facility]    Medication Adjustments/Labs and Tests Ordered: Current medicines are reviewed at length with the patient today.  Concerns regarding medicines are outlined above.  No orders of the defined types were placed in this encounter.  No orders of the defined types were placed in this encounter.   Patient Instructions  Medication Instructions:  Start Losartan 12.5 mg daily  *If you need a refill on your cardiac medications before your next appointment, please call your pharmacy*   Lab Work: Your physician recommends that you return for lab work in 1 week ( BMP)    Testing/Procedures: None   Follow-Up: At [facility], you and your health needs are our priority.  As part of our continuing mission to provide you with exceptional heart care, we have created designated Provider Care Teams.  These Care Teams  include your primary Cardiologist (physician) and Advanced Practice Providers (APPs -  Physician Assistants and Nurse Practitioners) who all work together to provide you with the care you need, when you need it.  We recommend signing up for the patient portal called "MyChart".  Sign up information is provided on this After Visit Summary.  MyChart is used to connect with patients for Virtual Visits (Telemedicine).  Patients are able to view lab/test results, encounter notes, upcoming appointments, etc.  Non-urgent messages can be sent to your provider as well.   To learn more about what you can  do with MyChart, go to [email or web address].    Your next appointment:   3 month(s)  The format for your next appointment:   In Person  Provider:   Buford Dresser, MD      Signed, Buford Dresser, MD PhD 11/16/2019    [place]

## 2019-11-27 ENCOUNTER — Telehealth: Payer: Self-pay | Admitting: Cardiology

## 2019-11-27 NOTE — Telephone Encounter (Signed)
Spoke to patient advised he needs a bmet 1 week after starting losartan.Stated he will have done today.

## 2019-11-27 NOTE — Telephone Encounter (Signed)
New Message:      Pt would like to know when is she supposed to have lab work?

## 2019-11-28 LAB — BASIC METABOLIC PANEL
BUN/Creatinine Ratio: 9 — ABNORMAL LOW (ref 10–24)
BUN: 7 mg/dL — ABNORMAL LOW (ref 8–27)
CO2: 23 mmol/L (ref 20–29)
Calcium: 9.3 mg/dL (ref 8.6–10.2)
Chloride: 96 mmol/L (ref 96–106)
Creatinine, Ser: 0.8 mg/dL (ref 0.76–1.27)
GFR calc Af Amer: 108 mL/min/{1.73_m2} (ref >59)
GFR calc non Af Amer: 93 mL/min/{1.73_m2} (ref >59)
Glucose: 83 mg/dL (ref 65–99)
Potassium: 4.6 mmol/L (ref 3.5–5.2)
Sodium: 133 mmol/L — ABNORMAL LOW (ref 134–144)

## 2019-12-12 ENCOUNTER — Ambulatory Visit: Payer: 59 | Admitting: Cardiology

## 2019-12-14 ENCOUNTER — Encounter: Payer: Self-pay | Admitting: Adult Health

## 2019-12-14 ENCOUNTER — Ambulatory Visit (INDEPENDENT_AMBULATORY_CARE_PROVIDER_SITE_OTHER): Payer: Medicare Other | Admitting: Adult Health

## 2019-12-14 ENCOUNTER — Ambulatory Visit (INDEPENDENT_AMBULATORY_CARE_PROVIDER_SITE_OTHER): Payer: Medicare Other

## 2019-12-14 ENCOUNTER — Other Ambulatory Visit: Payer: Self-pay

## 2019-12-14 VITALS — BP 104/62 | HR 69 | Temp 96.8°F | Ht 67.0 in | Wt 170.2 lb

## 2019-12-14 DIAGNOSIS — J209 Acute bronchitis, unspecified: Secondary | ICD-10-CM | POA: Diagnosis not present

## 2019-12-14 DIAGNOSIS — J189 Pneumonia, unspecified organism: Secondary | ICD-10-CM | POA: Diagnosis not present

## 2019-12-14 DIAGNOSIS — J440 Chronic obstructive pulmonary disease with acute lower respiratory infection: Secondary | ICD-10-CM | POA: Diagnosis not present

## 2019-12-14 NOTE — Patient Instructions (Addendum)
Chest xray today .  Activity as tolerated.  Great job not smoking .  Follow up with Dr. Vaughan Browner in 4-6 weeks with PFT and As needed

## 2019-12-14 NOTE — Progress Notes (Signed)
@Patient  ID: Fernando [name], male    DOB: 01-Apr-1956, 64 y.o.   MRN: [id number]  Chief Complaint  Patient presents with  . Follow-up    Referring provider: [name], MD  HPI: 64 year old male former smoker quit July 2021 seen for hospital consult for acute hypoxic respiratory failure and abnormal CT chest  TEST/EVENTS :    12/14/2019 post hospital follow-up Patient presents for a post hospital follow-up.  Patient was seen for pulmonary consult during hospitalization July 2021.  Patient had progressive generalized weakness.  On admission CT chest showed bilateral bronchial wall thickening and septal thickening, there was some concern for possible interstitial lung disease.  Patient was treated with steroids.  Nebulized bronchodilators.  Did require oxygen but was weaned off prior to discharge. He has history heavy alcohol intake.  He was treated for possible alcohol withdrawal during hospital.  Patient was found to have new onset congestive heart failure felt to be secondary to severe alcohol use.  Patient was seen by cardiology and started on Coreg. Patient says since discharge he is feeling better.  He has less shortness of breath.  He has quit alcohol and smoking since discharge.  Live alone .  Retired , Museum/gallery curator Acupuncturist . From [place] , been in [place] since age 28 .  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 No childhood asthma , active teenage,/adult.  No copd or asthmna dx  Patient says he is independent lives alone.  Is able to drive and take care of his self.  He leads a sedentary lifestyle and does not exercise.  He has smoked 1 pack a day for the last 50 years.  Prior to admission he drinks about 3 vodka drinks a day. (hosptial report indicated 1/2 gallon of vodka daily )  He has minimum cough.  Gets short of breath with activities at times.  No chest pain orthopnea or increased leg swelling  No Known Allergies   There is no immunization history on file for this patient.  Past  Medical History:  Diagnosis Date  . Arthritis   . Cataract   . History of kidney stones   . Hyperlipidemia   . Hypertension   . Pneumonia     Tobacco History: Social History   Tobacco Use  Smoking Status Former Smoker  . Packs/day: 0.50  . Years: 50.00  . Pack years: 25.00  . Types: Cigarettes  . Quit date: 11/04/2019  . Years since quitting: 0.1  Smokeless Tobacco Never Used   Counseling given: Not Answered   Outpatient Medications Prior to Visit  Medication Sig Dispense Refill  . allopurinol (ZYLOPRIM) 100 MG tablet Take 200 mg by mouth daily.     Marland Kitchen losartan (COZAAR) 25 MG tablet Take 0.5 tablets (12.5 mg total) by mouth daily. 15 tablet 11  . Multiple Vitamin (MULTIVITAMIN) tablet Take 1 tablet by mouth once a week.    . carvedilol (COREG) 3.125 MG tablet Take 1 tablet (3.125 mg total) by mouth 2 (two) times daily with a meal. 60 tablet 0   No facility-administered medications prior to visit.     Review of Systems:   Constitutional:   No  weight loss, night sweats,  Fevers, chills,  +fatigue, or  lassitude.  HEENT:   No headaches,  Difficulty swallowing,  Tooth/dental problems, or  Sore throat,                No sneezing, itching, ear ache, nasal congestion, post nasal drip,   CV:  No chest pain,  Orthopnea, PND, swelling in lower extremities, anasarca, dizziness, palpitations, syncope.   GI  No heartburn, indigestion, abdominal pain, nausea, vomiting, diarrhea, change in bowel habits, loss of appetite, bloody stools.   Resp:   No chest wall deformity  Skin: no rash or lesions.  GU: no dysuria, change in color of urine, no urgency or frequency.  No flank pain, no hematuria   MS:  No joint pain or swelling.  No decreased range of motion.  No back pain.    Physical Exam  BP 104/62 (BP Location: Left Arm, Cuff Size: Normal)   Pulse 69   Temp (!) 96.8 F (36 C) (Temporal)   Ht 5\' 7"  (1.702 m)   Wt 170 lb 3.2 oz (77.2 kg)   SpO2 99% Comment: RA  BMI  26.66 kg/m   GEN: A/Ox3; pleasant , NAD,    HEENT:  [place]/AT,   NOSE-clear, THROAT-clear, no lesions, no postnasal drip or exudate noted.   NECK:  Supple w/ fair ROM; no JVD; normal carotid impulses w/o bruits; no thyromegaly or nodules palpated; no lymphadenopathy.    RESP  Clear  P & A; w/o, wheezes/ rales/ or rhonchi. no accessory muscle use, no dullness to percussion  CARD:  RRR, no m/r/g, no peripheral edema, pulses intact, no cyanosis or clubbing.  GI:   Soft & nt; nml bowel sounds; no organomegaly or masses detected.   Musco: Warm bil, no deformities or joint swelling noted.   Neuro: alert, no focal deficits noted.    Skin: Warm, no lesions or rashes    Lab Results:  CBC    Component Value Date/Time   WBC 5.0 11/01/2019 0119   RBC 2.69 (L) 11/01/2019 0119   HGB 10.9 (L) 11/01/2019 0119   HCT 31.9 (L) 11/01/2019 0119   PLT 121 (L) 11/01/2019 0119   MCV 118.6 (H) 11/01/2019 0119   MCH 40.5 (H) 11/01/2019 0119   MCHC 34.2 11/01/2019 0119   RDW 12.7 11/01/2019 0119   LYMPHSABS 1.0 11/01/2019 0119   MONOABS 1.0 11/01/2019 0119   EOSABS 0.1 11/01/2019 0119   BASOSABS 0.0 11/01/2019 0119    BMET    Component Value Date/Time   NA 133 (L) 11/28/2019 0923   K 4.6 11/28/2019 0923   CL 96 11/28/2019 0923   CO2 23 11/28/2019 0923   GLUCOSE 83 11/28/2019 0923   GLUCOSE 106 (H) 11/01/2019 0119   BUN 7 (L) 11/28/2019 0923   CREATININE 0.80 11/28/2019 0923   CALCIUM 9.3 11/28/2019 0923   GFRNONAA 93 11/28/2019 0923   GFRAA 108 11/28/2019 0923    BNP    Component Value Date/Time   BNP 349.4 (H) 10/25/2019 1240    ProBNP    Component Value Date/Time   PROBNP 217.0 (H) 07/16/2008 1420    Imaging: No results found.    No flowsheet data found.  No results found for: NITRICOXIDE      Assessment & Plan:   No problem-specific Assessment & Plan notes found for this encounter.     Rexene Edison, NP 12/14/2019

## 2019-12-18 NOTE — Progress Notes (Signed)
Called patient with results of recent chest x ray. Result notes reviewed with patient. Follow up appointment reviewed.

## 2019-12-19 ENCOUNTER — Telehealth: Payer: Self-pay | Admitting: Adult Health

## 2019-12-19 NOTE — Telephone Encounter (Signed)
Fernando Needles, NP  12/18/2019 1:02 PM EDT     Chest x-ray shows scarring along the left lower lobe. Will discuss in detail on follow-up visit. Continue with office visit recommendations and follow-up   Called and spoke with pt letting him know the results of the cxr again and also answered questions that pt had. Nothing further needed.

## 2019-12-20 NOTE — Assessment & Plan Note (Signed)
Abnormal CT chest suspicious for possible pneumonitis and patient with heavy alcohol use.  Newly diagnosed congestive heart failure.  And had a smoking history.  Clinically patient has improved.  Congratulated on smoking and alcohol cessation.  We will check pulmonary function testing on return. Pending those results decide on follow-up scans  Plan  Patient Instructions  Chest xray today .  Activity as tolerated.  Great job not smoking .  Follow up with Dr. Vaughan Browner in 4-6 weeks with PFT and As needed

## 2020-01-13 ENCOUNTER — Encounter: Payer: Self-pay | Admitting: Cardiology

## 2020-01-26 ENCOUNTER — Emergency Department (HOSPITAL_COMMUNITY): Payer: Medicare Other

## 2020-01-26 ENCOUNTER — Encounter (HOSPITAL_COMMUNITY): Payer: Self-pay | Admitting: Internal Medicine

## 2020-01-26 ENCOUNTER — Other Ambulatory Visit: Payer: Self-pay

## 2020-01-26 ENCOUNTER — Inpatient Hospital Stay (HOSPITAL_COMMUNITY)
Admission: EM | Admit: 2020-01-26 | Discharge: 2020-02-05 | DRG: 559 | Disposition: A | Discharge status: Home Health | Payer: Medicare Other | Attending: Internal Medicine | Admitting: Internal Medicine

## 2020-01-26 DIAGNOSIS — D638 Anemia in other chronic diseases classified elsewhere: Secondary | ICD-10-CM

## 2020-01-26 DIAGNOSIS — K7010 Alcoholic hepatitis without ascites: Secondary | ICD-10-CM

## 2020-01-26 DIAGNOSIS — W19XXXA Unspecified fall, initial encounter: Secondary | ICD-10-CM

## 2020-01-26 DIAGNOSIS — R0902 Hypoxemia: Secondary | ICD-10-CM

## 2020-01-26 DIAGNOSIS — T84028A Dislocation of other internal joint prosthesis, initial encounter: Secondary | ICD-10-CM

## 2020-01-26 DIAGNOSIS — R000 Tachycardia, unspecified: Secondary | ICD-10-CM

## 2020-01-26 DIAGNOSIS — N179 Acute kidney failure, unspecified: Secondary | ICD-10-CM | POA: Diagnosis present

## 2020-01-26 DIAGNOSIS — F1020 Alcohol dependence, uncomplicated: Secondary | ICD-10-CM | POA: Diagnosis present

## 2020-01-26 DIAGNOSIS — I5022 Chronic systolic (congestive) heart failure: Secondary | ICD-10-CM | POA: Diagnosis present

## 2020-01-26 DIAGNOSIS — I10 Essential (primary) hypertension: Secondary | ICD-10-CM | POA: Diagnosis present

## 2020-01-26 DIAGNOSIS — I5043 Acute on chronic combined systolic (congestive) and diastolic (congestive) heart failure: Secondary | ICD-10-CM

## 2020-01-26 DIAGNOSIS — I959 Hypotension, unspecified: Secondary | ICD-10-CM | POA: Diagnosis present

## 2020-01-26 DIAGNOSIS — F17200 Nicotine dependence, unspecified, uncomplicated: Secondary | ICD-10-CM | POA: Diagnosis present

## 2020-01-26 DIAGNOSIS — K3189 Other diseases of stomach and duodenum: Secondary | ICD-10-CM

## 2020-01-26 DIAGNOSIS — E785 Hyperlipidemia, unspecified: Secondary | ICD-10-CM | POA: Diagnosis present

## 2020-01-26 DIAGNOSIS — K8689 Other specified diseases of pancreas: Secondary | ICD-10-CM

## 2020-01-26 DIAGNOSIS — M25511 Pain in right shoulder: Secondary | ICD-10-CM | POA: Diagnosis not present

## 2020-01-26 DIAGNOSIS — D539 Nutritional anemia, unspecified: Secondary | ICD-10-CM | POA: Diagnosis present

## 2020-01-26 DIAGNOSIS — Z96619 Presence of unspecified artificial shoulder joint: Secondary | ICD-10-CM

## 2020-01-26 DIAGNOSIS — Z86010 Personal history of colonic polyps: Secondary | ICD-10-CM

## 2020-01-26 DIAGNOSIS — L89310 Pressure ulcer of right buttock, unstageable: Secondary | ICD-10-CM | POA: Diagnosis present

## 2020-01-26 DIAGNOSIS — E861 Hypovolemia: Secondary | ICD-10-CM | POA: Diagnosis present

## 2020-01-26 DIAGNOSIS — K760 Fatty (change of) liver, not elsewhere classified: Secondary | ICD-10-CM | POA: Diagnosis present

## 2020-01-26 DIAGNOSIS — F17210 Nicotine dependence, cigarettes, uncomplicated: Secondary | ICD-10-CM | POA: Diagnosis present

## 2020-01-26 DIAGNOSIS — Z96611 Presence of right artificial shoulder joint: Secondary | ICD-10-CM | POA: Diagnosis present

## 2020-01-26 DIAGNOSIS — Z20822 Contact with and (suspected) exposure to covid-19: Secondary | ICD-10-CM | POA: Diagnosis present

## 2020-01-26 DIAGNOSIS — Y907 Blood alcohol level of 200-239 mg/100 ml: Secondary | ICD-10-CM | POA: Diagnosis present

## 2020-01-26 DIAGNOSIS — K766 Portal hypertension: Secondary | ICD-10-CM | POA: Diagnosis present

## 2020-01-26 DIAGNOSIS — Y92009 Unspecified place in unspecified non-institutional (private) residence as the place of occurrence of the external cause: Secondary | ICD-10-CM | POA: Diagnosis not present

## 2020-01-26 DIAGNOSIS — K709 Alcoholic liver disease, unspecified: Secondary | ICD-10-CM

## 2020-01-26 DIAGNOSIS — J9601 Acute respiratory failure with hypoxia: Secondary | ICD-10-CM | POA: Diagnosis present

## 2020-01-26 DIAGNOSIS — K921 Melena: Secondary | ICD-10-CM | POA: Diagnosis present

## 2020-01-26 DIAGNOSIS — D696 Thrombocytopenia, unspecified: Secondary | ICD-10-CM | POA: Diagnosis present

## 2020-01-26 DIAGNOSIS — Z8249 Family history of ischemic heart disease and other diseases of the circulatory system: Secondary | ICD-10-CM

## 2020-01-26 DIAGNOSIS — I255 Ischemic cardiomyopathy: Secondary | ICD-10-CM | POA: Diagnosis present

## 2020-01-26 DIAGNOSIS — W1830XA Fall on same level, unspecified, initial encounter: Secondary | ICD-10-CM | POA: Diagnosis present

## 2020-01-26 DIAGNOSIS — Y92002 Bathroom of unspecified non-institutional (private) residence single-family (private) house as the place of occurrence of the external cause: Secondary | ICD-10-CM

## 2020-01-26 DIAGNOSIS — F40240 Claustrophobia: Secondary | ICD-10-CM | POA: Diagnosis present

## 2020-01-26 DIAGNOSIS — D62 Acute posthemorrhagic anemia: Secondary | ICD-10-CM | POA: Diagnosis present

## 2020-01-26 DIAGNOSIS — K869 Disease of pancreas, unspecified: Secondary | ICD-10-CM | POA: Diagnosis present

## 2020-01-26 DIAGNOSIS — E871 Hypo-osmolality and hyponatremia: Secondary | ICD-10-CM | POA: Diagnosis present

## 2020-01-26 DIAGNOSIS — S01111A Laceration without foreign body of right eyelid and periocular area, initial encounter: Secondary | ICD-10-CM | POA: Diagnosis present

## 2020-01-26 DIAGNOSIS — I4510 Unspecified right bundle-branch block: Secondary | ICD-10-CM | POA: Diagnosis present

## 2020-01-26 DIAGNOSIS — I110 Hypertensive heart disease with heart failure: Secondary | ICD-10-CM | POA: Diagnosis present

## 2020-01-26 DIAGNOSIS — J690 Pneumonitis due to inhalation of food and vomit: Secondary | ICD-10-CM | POA: Diagnosis present

## 2020-01-26 DIAGNOSIS — S0121XA Laceration without foreign body of nose, initial encounter: Secondary | ICD-10-CM | POA: Diagnosis present

## 2020-01-26 DIAGNOSIS — F10229 Alcohol dependence with intoxication, unspecified: Secondary | ICD-10-CM | POA: Diagnosis present

## 2020-01-26 DIAGNOSIS — Z79899 Other long term (current) drug therapy: Secondary | ICD-10-CM

## 2020-01-26 DIAGNOSIS — Y792 Prosthetic and other implants, materials and accessory orthopedic devices associated with adverse incidents: Secondary | ICD-10-CM | POA: Diagnosis present

## 2020-01-26 DIAGNOSIS — E876 Hypokalemia: Secondary | ICD-10-CM | POA: Diagnosis not present

## 2020-01-26 DIAGNOSIS — G9341 Metabolic encephalopathy: Secondary | ICD-10-CM | POA: Diagnosis present

## 2020-01-26 HISTORY — DX: Chronic systolic (congestive) heart failure: I50.22

## 2020-01-26 HISTORY — DX: Essential (primary) hypertension: I10

## 2020-01-26 HISTORY — DX: Alcohol dependence, uncomplicated: F10.20

## 2020-01-26 HISTORY — DX: Presence of unspecified artificial shoulder joint: Z96.619

## 2020-01-26 HISTORY — DX: Infection and inflammatory reaction due to other internal joint prosthesis, initial encounter: T84.59XA

## 2020-01-26 HISTORY — DX: Hyperlipidemia, unspecified: E78.5

## 2020-01-26 LAB — COMPREHENSIVE METABOLIC PANEL
ALT: 75 U/L — ABNORMAL HIGH (ref 0–44)
AST: 149 U/L — ABNORMAL HIGH (ref 15–41)
Albumin: 2.9 g/dL — ABNORMAL LOW (ref 3.5–5.0)
Alkaline Phosphatase: 104 U/L (ref 38–126)
Anion gap: 16 — ABNORMAL HIGH (ref 5–15)
BUN: 16 mg/dL (ref 8–23)
CO2: 20 mmol/L — ABNORMAL LOW (ref 22–32)
Calcium: 8.1 mg/dL — ABNORMAL LOW (ref 8.9–10.3)
Chloride: 95 mmol/L — ABNORMAL LOW (ref 98–111)
Creatinine, Ser: 1.32 mg/dL — ABNORMAL HIGH (ref 0.61–1.24)
GFR, Estimated: 59 mL/min — ABNORMAL LOW (ref >60)
Glucose, Bld: 134 mg/dL — ABNORMAL HIGH (ref 70–99)
Potassium: 4.2 mmol/L (ref 3.5–5.1)
Sodium: 131 mmol/L — ABNORMAL LOW (ref 135–145)
Total Bilirubin: 1.8 mg/dL — ABNORMAL HIGH (ref 0.3–1.2)
Total Protein: 5.6 g/dL — ABNORMAL LOW (ref 6.5–8.1)

## 2020-01-26 LAB — CBC
HCT: 29.3 % — ABNORMAL LOW (ref 39.0–52.0)
HCT: 27.3 % — ABNORMAL LOW (ref 39.0–52.0)
Hemoglobin: 10.1 g/dL — ABNORMAL LOW (ref 13.0–17.0)
Hemoglobin: 9.1 g/dL — ABNORMAL LOW (ref 13.0–17.0)
MCH: 37 pg — ABNORMAL HIGH (ref 26.0–34.0)
MCH: 36.1 pg — ABNORMAL HIGH (ref 26.0–34.0)
MCHC: 34.5 g/dL (ref 30.0–36.0)
MCHC: 33.3 g/dL (ref 30.0–36.0)
MCV: 107.3 fL — ABNORMAL HIGH (ref 80.0–100.0)
MCV: 108.3 fL — ABNORMAL HIGH (ref 80.0–100.0)
Platelets: 69 10*3/uL — ABNORMAL LOW (ref 150–400)
Platelets: 52 10*3/uL — ABNORMAL LOW (ref 150–400)
RBC: 2.73 MIL/uL — ABNORMAL LOW (ref 4.22–5.81)
RBC: 2.52 MIL/uL — ABNORMAL LOW (ref 4.22–5.81)
RDW: 13.8 % (ref 11.5–15.5)
RDW: 14.1 % (ref 11.5–15.5)
WBC: 6.9 10*3/uL (ref 4.0–10.5)
WBC: 6.6 10*3/uL (ref 4.0–10.5)
nRBC: 0 % (ref 0.0–0.2)
nRBC: 0 % (ref 0.0–0.2)

## 2020-01-26 LAB — I-STAT CHEM 8, ED
BUN: 15 mg/dL (ref 8–23)
Calcium, Ion: 1.02 mmol/L — ABNORMAL LOW (ref 1.15–1.40)
Chloride: 93 mmol/L — ABNORMAL LOW (ref 98–111)
Creatinine, Ser: 1.4 mg/dL — ABNORMAL HIGH (ref 0.61–1.24)
Glucose, Bld: 128 mg/dL — ABNORMAL HIGH (ref 70–99)
HCT: 30 % — ABNORMAL LOW (ref 39.0–52.0)
Hemoglobin: 10.2 g/dL — ABNORMAL LOW (ref 13.0–17.0)
Potassium: 3.9 mmol/L (ref 3.5–5.1)
Sodium: 131 mmol/L — ABNORMAL LOW (ref 135–145)
TCO2: 19 mmol/L — ABNORMAL LOW (ref 22–32)

## 2020-01-26 LAB — RESPIRATORY PANEL BY RT PCR (FLU A&B, COVID)
Influenza A by PCR: NEGATIVE
Influenza B by PCR: NEGATIVE
SARS Coronavirus 2 by RT PCR: NEGATIVE

## 2020-01-26 LAB — TROPONIN I (HIGH SENSITIVITY)
Troponin I (High Sensitivity): 22 ng/L — ABNORMAL HIGH (ref <18)
Troponin I (High Sensitivity): 25 ng/L — ABNORMAL HIGH (ref <18)

## 2020-01-26 LAB — TSH: TSH: 4.012 u[IU]/mL (ref 0.350–4.500)

## 2020-01-26 LAB — HEMOGLOBIN AND HEMATOCRIT, BLOOD
HCT: 27 % — ABNORMAL LOW (ref 39.0–52.0)
Hemoglobin: 9.2 g/dL — ABNORMAL LOW (ref 13.0–17.0)

## 2020-01-26 LAB — LACTIC ACID, PLASMA
Lactic Acid, Venous: 4.6 mmol/L (ref 0.5–1.9)
Lactic Acid, Venous: 2.1 mmol/L (ref 0.5–1.9)

## 2020-01-26 LAB — PROTIME-INR
INR: 1.1 (ref 0.8–1.2)
Prothrombin Time: 13.3 seconds (ref 11.4–15.2)

## 2020-01-26 LAB — ETHANOL: Alcohol, Ethyl (B): 224 mg/dL — ABNORMAL HIGH (ref <10)

## 2020-01-26 LAB — HIV ANTIBODY (ROUTINE TESTING W REFLEX): HIV Screen 4th Generation wRfx: NONREACTIVE

## 2020-01-26 LAB — ABO/RH: ABO/RH(D): O POS

## 2020-01-26 MED ORDER — BISACODYL 5 MG PO TBEC: 5.0000 mg | DELAYED_RELEASE_TABLET | Freq: Every day | ORAL | Status: DC | PRN

## 2020-01-26 MED ORDER — IOHEXOL 300 MG/ML  SOLN
100.0000 mL | Freq: Once | INTRAMUSCULAR | Status: AC | PRN
  Administered 2020-01-26: 100 mL via INTRAVENOUS

## 2020-01-26 MED ORDER — THIAMINE HCL 100 MG PO TABS
100.0000 mg | ORAL_TABLET | Freq: Every day | ORAL | Status: DC
  Administered 2020-01-26 – 2020-02-05 (×11): 100 mg via ORAL
  Filled 2020-01-26 (×11): qty 1

## 2020-01-26 MED ORDER — HYDRALAZINE HCL 20 MG/ML IJ SOLN: 5.0000 mg | INTRAMUSCULAR | Status: DC | PRN

## 2020-01-26 MED ORDER — ONDANSETRON HCL 4 MG/2ML IJ SOLN: 4.0000 mg | Freq: Four times a day (QID) | INTRAMUSCULAR | Status: DC | PRN

## 2020-01-26 MED ORDER — DIAZEPAM 5 MG PO TABS
5.0000 mg | ORAL_TABLET | Freq: Four times a day (QID) | ORAL | Status: DC
  Administered 2020-01-26 – 2020-01-27 (×3): 5 mg via ORAL
  Filled 2020-01-26 (×3): qty 1

## 2020-01-26 MED ORDER — FOLIC ACID 1 MG PO TABS
1.0000 mg | ORAL_TABLET | Freq: Every day | ORAL | Status: DC
  Administered 2020-01-26 – 2020-02-05 (×11): 1 mg via ORAL
  Filled 2020-01-26 (×11): qty 1

## 2020-01-26 MED ORDER — HYDROCODONE-ACETAMINOPHEN 5-325 MG PO TABS
1.0000 | ORAL_TABLET | ORAL | Status: DC | PRN
  Administered 2020-01-27: 2 via ORAL
  Administered 2020-01-27: 1 via ORAL
  Administered 2020-01-28 – 2020-01-30 (×3): 2 via ORAL
  Administered 2020-01-31 – 2020-02-01 (×3): 1 via ORAL
  Administered 2020-02-02: 2 via ORAL
  Filled 2020-01-26: qty 1
  Filled 2020-01-26 (×2): qty 2
  Filled 2020-01-26: qty 1
  Filled 2020-01-26 (×3): qty 2
  Filled 2020-01-26 (×2): qty 1

## 2020-01-26 MED ORDER — SODIUM CHLORIDE 0.9 % IV BOLUS: 500.0000 mL | Freq: Once | INTRAVENOUS | Status: DC

## 2020-01-26 MED ORDER — ACETAMINOPHEN 650 MG RE SUPP: 650.0000 mg | Freq: Four times a day (QID) | RECTAL | Status: DC | PRN

## 2020-01-26 MED ORDER — SODIUM CHLORIDE 0.9 % IV BOLUS
1000.0000 mL | Freq: Once | INTRAVENOUS | Status: AC
  Administered 2020-01-26: 1000 mL via INTRAVENOUS

## 2020-01-26 MED ORDER — ENOXAPARIN SODIUM 40 MG/0.4ML ~~LOC~~ SOLN
40.0000 mg | SUBCUTANEOUS | Status: DC
  Administered 2020-01-26 – 2020-01-27 (×2): 40 mg via SUBCUTANEOUS
  Filled 2020-01-26 (×2): qty 0.4

## 2020-01-26 MED ORDER — NICOTINE 14 MG/24HR TD PT24
14.0000 mg | MEDICATED_PATCH | Freq: Every day | TRANSDERMAL | Status: DC
  Administered 2020-01-26 – 2020-02-04 (×10): 14 mg via TRANSDERMAL
  Filled 2020-01-26 (×10): qty 1

## 2020-01-26 MED ORDER — LORAZEPAM 1 MG PO TABS: 1.0000 mg | ORAL_TABLET | ORAL | Status: DC | PRN

## 2020-01-26 MED ORDER — THIAMINE HCL 100 MG/ML IJ SOLN
100.0000 mg | Freq: Every day | INTRAMUSCULAR | Status: DC
  Filled 2020-01-26 (×2): qty 2

## 2020-01-26 MED ORDER — MORPHINE SULFATE (PF) 2 MG/ML IV SOLN: 2.0000 mg | INTRAVENOUS | Status: DC | PRN

## 2020-01-26 MED ORDER — LIDOCAINE HCL (PF) 1 % IJ SOLN
INTRAMUSCULAR | Status: AC
  Filled 2020-01-26: qty 5

## 2020-01-26 MED ORDER — LORAZEPAM 2 MG/ML IJ SOLN
1.0000 mg | INTRAMUSCULAR | Status: AC | PRN
  Administered 2020-01-27: 2 mg via INTRAVENOUS
  Administered 2020-01-29: 1 mg via INTRAVENOUS
  Filled 2020-01-26 (×2): qty 1

## 2020-01-26 MED ORDER — POLYETHYLENE GLYCOL 3350 17 G PO PACK: 17.0000 g | PACK | Freq: Every day | ORAL | Status: DC | PRN

## 2020-01-26 MED ORDER — ALBUTEROL SULFATE (2.5 MG/3ML) 0.083% IN NEBU: 2.5000 mg | INHALATION_SOLUTION | RESPIRATORY_TRACT | Status: DC | PRN

## 2020-01-26 MED ORDER — DOCUSATE SODIUM 100 MG PO CAPS
100.0000 mg | ORAL_CAPSULE | Freq: Two times a day (BID) | ORAL | Status: DC
  Administered 2020-01-26 – 2020-02-05 (×19): 100 mg via ORAL
  Filled 2020-01-26 (×21): qty 1

## 2020-01-26 MED ORDER — ACETAMINOPHEN 325 MG PO TABS
650.0000 mg | ORAL_TABLET | Freq: Four times a day (QID) | ORAL | Status: DC | PRN
  Administered 2020-01-28 – 2020-01-29 (×2): 650 mg via ORAL
  Filled 2020-01-26 (×2): qty 2

## 2020-01-26 MED ORDER — LORAZEPAM 2 MG/ML IJ SOLN
0.0000 mg | INTRAMUSCULAR | Status: AC
  Administered 2020-01-26: 1 mg via INTRAVENOUS
  Administered 2020-01-27 (×2): 2 mg via INTRAVENOUS
  Administered 2020-01-27: 1 mg via INTRAVENOUS
  Administered 2020-01-27 – 2020-01-28 (×5): 2 mg via INTRAVENOUS
  Filled 2020-01-26 (×10): qty 1

## 2020-01-26 MED ORDER — LORAZEPAM 1 MG PO TABS
1.0000 mg | ORAL_TABLET | ORAL | Status: AC | PRN
  Administered 2020-01-27: 1 mg via ORAL
  Filled 2020-01-26: qty 2

## 2020-01-26 MED ORDER — LORAZEPAM 2 MG/ML IJ SOLN
0.0000 mg | Freq: Three times a day (TID) | INTRAMUSCULAR | Status: AC
  Administered 2020-01-29 (×2): 1 mg via INTRAVENOUS
  Filled 2020-01-26 (×2): qty 1

## 2020-01-26 MED ORDER — ADULT MULTIVITAMIN W/MINERALS CH
1.0000 | ORAL_TABLET | Freq: Every day | ORAL | Status: DC
  Administered 2020-01-27 – 2020-02-05 (×10): 1 via ORAL
  Filled 2020-01-26 (×11): qty 1

## 2020-01-26 MED ORDER — SODIUM CHLORIDE 0.9% FLUSH
3.0000 mL | Freq: Two times a day (BID) | INTRAVENOUS | Status: DC
  Administered 2020-01-26 – 2020-02-05 (×13): 3 mL via INTRAVENOUS

## 2020-01-26 MED ORDER — LORAZEPAM 2 MG/ML IJ SOLN
1.0000 mg | INTRAMUSCULAR | Status: DC | PRN
  Administered 2020-01-26: 2 mg via INTRAVENOUS
  Administered 2020-01-26: 1 mg via INTRAVENOUS
  Filled 2020-01-26 (×2): qty 1

## 2020-01-26 MED ORDER — LACTATED RINGERS IV SOLN: INTRAVENOUS | Status: DC

## 2020-01-26 MED ORDER — ONDANSETRON HCL 4 MG PO TABS: 4.0000 mg | ORAL_TABLET | Freq: Four times a day (QID) | ORAL | Status: DC | PRN

## 2020-01-26 NOTE — ED Notes (Signed)
Pt to CT via stretcher

## 2020-01-26 NOTE — Progress Notes (Signed)
Orthopedic Tech Progress Note Patient Details:  Fernando [name] 1955-07-14 [id number]  Ortho Devices Type of Ortho Device: Arm sling Ortho Device/Splint Location: RUE Ortho Device/Splint Interventions: Application, Adjustment   Post Interventions Patient Tolerated: Well Instructions Provided: Adjustment of device   Fernando [name] 01/26/2020, 6:17 AM

## 2020-01-26 NOTE — ED Notes (Signed)
552mL bolus started per [facility] from [place] MD

## 2020-01-26 NOTE — Progress Notes (Signed)
OT Cancellation Note  Patient Details Name: ELBERT SPICKLER MRN: [id number] DOB: 1955/12/17   Cancelled Treatment:    Reason Eval/Treat Not Completed: Medical issues which prohibited therapy. HER 160s. Pt most having difficulty participating per PT. Will see at a later time.  Kyleen Villatoro,HILLARY 01/26/2020, 2:40 PM  Maurie Boettcher, OT/L   Acute OT Clinical Specialist Acute Rehabilitation Services Pager [phone] Office [phone]

## 2020-01-26 NOTE — ED Provider Notes (Signed)
..Laceration Repair  Date/Time: 01/26/2020 2:43 AM Performed by: Abigail Butts, PA-C Authorized by: Abigail Butts, PA-C   Consent:    Consent obtained:  Verbal   Consent given by:  Patient   Risks discussed:  Infection, need for additional repair, pain, poor cosmetic result and poor wound healing   Alternatives discussed:  No treatment and delayed treatment Universal protocol:    Procedure explained and questions answered to patient or proxy's satisfaction: yes     Relevant documents present and verified: yes     Test results available and properly labeled: yes     Imaging studies available: yes     Required blood products, implants, devices, and special equipment available: yes     Site/side marked: yes     Immediately prior to procedure, a time out was called: yes     Patient identity confirmed:  Verbally with patient Anesthesia (see MAR for exact dosages):    Anesthesia method:  Local infiltration   Local anesthetic:  Lidocaine 2% WITH epi Laceration details:    Location:  Face   Face location:  R eyebrow   Length (cm):  5 Repair type:    Repair type:  Intermediate Pre-procedure details:    Preparation:  Imaging obtained to evaluate for foreign bodies and patient was prepped and draped in usual sterile fashion Exploration:    Hemostasis achieved with:  Epinephrine and direct pressure   Wound exploration: entire depth of wound probed and visualized   Treatment:    Area cleansed with:  Saline   Amount of cleaning:  Standard   Irrigation solution:  Sterile water   Irrigation method:  Syringe Skin repair:    Repair method:  Sutures   Suture size:  4-0   Suture material:  Nylon   Suture technique:  Simple interrupted   Number of sutures:  4 Approximation:    Approximation:  Close Post-procedure details:    Dressing:  Open (no dressing)   Patient tolerance of procedure:  Tolerated well, no immediate complications .Marland KitchenLaceration Repair  Date/Time:  01/26/2020 2:43 AM Performed by: Abigail Butts, PA-C Authorized by: Abigail Butts, PA-C   Consent:    Consent obtained:  Verbal   Consent given by:  Patient   Risks discussed:  Infection, need for additional repair, pain, poor cosmetic result and poor wound healing   Alternatives discussed:  No treatment and delayed treatment Universal protocol:    Procedure explained and questions answered to patient or proxy's satisfaction: yes     Relevant documents present and verified: yes     Test results available and properly labeled: yes     Imaging studies available: yes     Required blood products, implants, devices, and special equipment available: yes     Site/side marked: yes     Immediately prior to procedure, a time out was called: yes     Patient identity confirmed:  Verbally with patient Anesthesia (see MAR for exact dosages):    Anesthesia method:  Local infiltration   Local anesthetic:  Lidocaine 2% WITH epi Laceration details:    Location:  Face   Face location:  Nose   Length (cm):  1.5 Repair type:    Repair type:  Intermediate Pre-procedure details:    Preparation:  Imaging obtained to evaluate for foreign bodies and patient was prepped and draped in usual sterile fashion Exploration:    Hemostasis achieved with:  Epinephrine and direct pressure   Wound exploration: entire depth of wound probed and  visualized   Treatment:    Area cleansed with:  Saline   Amount of cleaning:  Standard   Irrigation solution:  Sterile water   Irrigation method:  Syringe Skin repair:    Repair method:  Sutures   Suture size:  4-0   Suture material:  Nylon   Suture technique:  Simple interrupted   Number of sutures:  1 Approximation:    Approximation:  Close Post-procedure details:    Dressing:  Open (no dressing)   Patient tolerance of procedure:  Tolerated well, no immediate complications      Fernando [name] 01/26/20 0244    Veryl Speak,  MD 01/26/20 [phone]

## 2020-01-26 NOTE — Progress Notes (Signed)
   01/26/20 0210  Clinical Encounter Type  Visited With Patient not available  Visit Type Trauma  Referral From Nurse  Consult/Referral To Chaplain  Chaplain responded. No family present. Advised Financial controller to call if family arrives and support is needed.This note was prepared by Jeanine Luz, M.Div..  For questions please contact by phone [phone].

## 2020-01-26 NOTE — H&P (Signed)
History and Physical    CAIDON [name] NFA:[id number] DOB: Jan 22, 1956 DOA: 01/26/2020  PCP: [name], MD Consultants:  [name] - cardiology; Tommy Medal - ID Patient coming from:  Home - lives alone, neighbor checks on him; NOK: Mackie Pai, [phone]  Chief Complaint: Fall  HPI: Fernando [name] is a 64 y.o. male with medical history significant of chronic systolic CHF (EF 46-96% in 10/2019);  ETOH dependence with DTs during 10/2019 admission; prosthetic shoulder joint infection; HTN; and HLD presenting after a fall.   He reports that he got up off the commode overnight, lost his balance, and hit his head with significant bleeding.  He had been drinking "a little bit" of alcohol, about as much as he usually drinks.  He reports that his R hip hurts but he is able to move it; he isn't sure he can bear weight on it.  His forehead is also sore.      ED Course:  Drinking last night and passed out but likely arterial  bleeding above R eyebrow.  SBP 50/ on arrival, better here.  GCS 15, lac repaired.  Hgb 10.2 -> 9.2.  Lactate 4.6 -> 2.1.  BP still running low - 90-100s.  EKG with RBBB.  Troponin 22.  Afebrile, no leukocytosis, does not appear to be septic.  [place] 224.  CT C/A/P unremarkable.  Has gotten 1500cc but reduced EF so maybe cautiously.  Dislocated shoulder (s/p shoulder replacement), put back in place.  Review of Systems: As per HPI; otherwise review of systems reviewed and negative.   Ambulatory Status:  Ambulates without assistance  COVID Vaccine Status:  None, not interested in receiving  Past Medical History:  Diagnosis Date  . Alcohol dependence ([place])   . Chronic systolic (congestive) heart failure (HCC)    EF 25-30% in 10/2019  . Dyslipidemia   . Essential hypertension   . Prosthetic shoulder infection, initial encounter [facility])     Past Surgical History:  Procedure Laterality Date  . TOTAL SHOULDER REPLACEMENT      Social History   Socioeconomic History   . Marital status: Single    Spouse name: Not on file  . Number of children: Not on file  . Years of education: Not on file  . Highest education level: Not on file  Occupational History  . Occupation: retired  Tobacco Use  . Smoking status: Current Every Day Smoker    Packs/day: 0.50    Years: 50.00    Pack years: 25.00  . Smokeless tobacco: Never Used  Substance and Sexual Activity  . Alcohol use: Yes    Alcohol/week: 14.0 standard drinks    Types: 14 Shots of liquor per week  . Drug use: Yes    Types: Marijuana  . Sexual activity: Not on file  Other Topics Concern  . Not on file  Social History Narrative  . Not on file   Social Determinants of Health   Financial Resource Strain:   . Difficulty of Paying Living Expenses: Not on file  Food Insecurity:   . Worried About Charity fundraiser in the Last Year: Not on file  . Ran Out of Food in the Last Year: Not on file  Transportation Needs:   . Lack of Transportation (Medical): Not on file  . Lack of Transportation (Non-Medical): Not on file  Physical Activity:   . Days of Exercise per Week: Not on file  . Minutes of Exercise per Session: Not on file  Stress:   .  Feeling of Stress : Not on file  Social Connections:   . Frequency of Communication with Friends and Family: Not on file  . Frequency of Social Gatherings with Friends and Family: Not on file  . Attends Religious Services: Not on file  . Active Member of Clubs or Organizations: Not on file  . Attends Archivist Meetings: Not on file  . Marital Status: Not on file  Intimate Partner Violence:   . Fear of Current or Ex-Partner: Not on file  . Emotionally Abused: Not on file  . Physically Abused: Not on file  . Sexually Abused: Not on file    No Known Allergies  History reviewed. No pertinent family history.  Prior to Admission medications   Not on File    Physical Exam: Vitals:   01/26/20 1430 01/26/20 1434 01/26/20 1435 01/26/20 1625    BP: 113/70   (!) 130/102  Pulse: (!) 120 (!) 106 (!) 108 (!) 131  Resp: (!) 41 (!) 21    Temp:      TempSrc:      SpO2: 92% 94%    Weight:      Height:         . General:  Appears disheveled and with significant dried blood on his face and neck; sutured lac above R eyebrow     . Eyes:  R periorbital hematoma, normal L lid, iris . ENT:  grossly normal hearing, lips & tongue, mmm . Neck:  no LAD, masses or thyromegaly . Cardiovascular:  RR with mild tachycardia, no m/r/g. No LE edema.  Marland Kitchen Respiratory:   CTA bilaterally with no wheezes/rales/rhonchi.  Normal to mildly increased respiratory effort. . Abdomen:  soft, NT, ND, NABS . Skin:  no rash or induration seen on limited exam other than as above . Musculoskeletal:  grossly normal tone BUE/BLE, good ROM, no bony abnormality . Lower extremity:  No LE edema.  Limited foot exam with no ulcerations.  2+ distal pulses. Marland Kitchen Psychiatric:  blunted mood and affect, speech fluent and appropriate, AOx3 . Neurologic:  CN 2-12 grossly intact, moves all extremities in coordinated fashion    Radiological Exams on Admission: DG Shoulder Right  Result Date: 01/26/2020 CLINICAL DATA:  Fall, RIGHT shoulder pain. EXAM: RIGHT SHOULDER - 2+ VIEW COMPARISON:  RIGHT shoulder plain film dated 06/21/2018. Chest x-ray dated 10/24/2019. FINDINGS: Superior dislocation, and at least mild anterior subluxation, of the humeral component of the RIGHT shoulder arthroplasty hardware relative to the glenoid component. Osseous irregularity along the inferior margin of the glenoid labrum, most likely chronic spurring or chronic soft tissue calcification related to the previous surgery. No convincing evidence of acute osseous fracture. IMPRESSION: 1. Superior-anterior dislocation of the humeral component of the RIGHT shoulder arthroplasty hardware relative to the glenoid component. 2. No convincing evidence of acute osseous fracture. Chronic appearing osseous spurring  and/or chronic soft tissue calcifications underlying the glenohumeral joint space. Electronically Signed   By: Franki Cabot M.D.   On: 01/26/2020 05:11   DG Wrist Complete Right  Result Date: 01/26/2020 CLINICAL DATA:  Fall EXAM: RIGHT WRIST - COMPLETE 3+ VIEW COMPARISON:  None. FINDINGS: There is no evidence of fracture or dislocation. There is diffuse osteopenia. Degenerative changes seen in the radiocarpal row. There is no evidence of arthropathy or other focal bone abnormality. Scattered vascular calcifications are noted. IMPRESSION: No acute osseous abnormality. Electronically Signed   By: Prudencio Pair M.D.   On: 01/26/2020 03:15   CT Head Wo  Contrast  Result Date: 01/26/2020 CLINICAL DATA:  Fall EXAM: CT HEAD WITHOUT CONTRAST CT MAXILLOFACIAL WITHOUT CONTRAST CT CERVICAL SPINE WITHOUT CONTRAST TECHNIQUE: Multidetector CT imaging of the head, cervical spine, and maxillofacial structures were performed using the standard protocol without intravenous contrast. Multiplanar CT image reconstructions of the cervical spine and maxillofacial structures were also generated. COMPARISON:  None. FINDINGS: CT HEAD FINDINGS Brain: There is no mass, hemorrhage or extra-axial collection. The size and configuration of the ventricles and extra-axial CSF spaces are normal. There is hypoattenuation of the periventricular white matter, most commonly indicating chronic ischemic microangiopathy. Vascular: Atherosclerotic calcification of the vertebral and internal carotid arteries at the skull base. No abnormal hyperdensity of the major intracranial arteries or dural venous sinuses. Skull: Right frontal scalp hematoma.  No skull fracture. CT MAXILLOFACIAL FINDINGS Osseous: --Complex facial fracture types: No LeFort, zygomaticomaxillary complex or nasoorbitoethmoidal fracture. --Simple fracture types: None. --Mandible: No fracture or dislocation. Orbits: The globes are intact. Normal appearance of the intra- and extraconal  fat. Symmetric extraocular muscles and optic nerves. Sinuses: Right maxillary retention cysts. Soft tissues: Normal visualized extracranial soft tissues. CT CERVICAL SPINE FINDINGS Alignment: Grade 1 anterolisthesis at C4-5 Skull base and vertebrae: No acute fracture. Soft tissues and spinal canal: No prevertebral fluid or swelling. No visible canal hematoma. 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 Facet arthrosis is worst at left C4-5 as well. Upper chest: No pneumothorax, pulmonary nodule or pleural effusion. Other: Normal visualized paraspinal cervical soft tissues. IMPRESSION: 1. Chronic ischemic microangiopathy without acute intracranial abnormality. 2. Right frontal scalp hematoma without skull fracture or facial fracture. 3. No acute fracture or static subluxation of the cervical spine. 4. 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 Electronically Signed   By: Ulyses Jarred M.D.   On: 01/26/2020 03:38   CT Chest W Contrast  Result Date: 01/26/2020 CLINICAL DATA:  Chest trauma, moderate to severe.  Fall in bathroom EXAM: CT CHEST, ABDOMEN, AND PELVIS WITH CONTRAST TECHNIQUE: Multidetector CT imaging of the chest, abdomen and pelvis was performed following the standard protocol during bolus administration of intravenous contrast. CONTRAST:  169mL OMNIPAQUE IOHEXOL 300 MG/ML  SOLN COMPARISON:  Chest CTA 10/25/2019 FINDINGS: CT CHEST FINDINGS Cardiovascular: Normal heart size. No pericardial effusion. Coronary atherosclerosis. No evidence of great vessel injury Mediastinum/Nodes: No pneumomediastinum or hematoma Lungs/Pleura: Streaky opacity from atelectasis. Given similar appearing reticulation on prior study there may also be interstitial lung disease. No honeycombing. No pleural effusion. Musculoskeletal: Right glenohumeral arthroplasty with joint effusion. Diffuse degenerative thoracic endplate spurring. Remote lower and posterior right rib fractures. No acute fracture or erosion. CT ABDOMEN PELVIS  FINDINGS Hepatobiliary: Marked hepatic steatosis. Pancreas: Solid and cystic mass measuring 3.4 cm at the tail, projecting superiorly. Mild adjacent fat stranding and no distinguishable plane between the mass in the stomach. Closely neighboring splenic artery without signs of pseudoaneurysm. Spleen: No splenic injury or perisplenic hematoma. Adrenals/Urinary Tract: No adrenal hemorrhage or renal injury identified. Bladder is unremarkable. 2.5 cm right renal cyst with simple CT appearance Stomach/Bowel: No evidence of injury. Indistinguishable greater curvature stomach and splenic tail mass, as above. Vascular/Lymphatic: Diffuse atheromatous calcification. Reproductive: Negative Other: No ascites or pneumoperitoneum Musculoskeletal: Soft tissue contusion posterior and lateral to the left hip. No acute fracture. Advanced lumbar spine degeneration. IMPRESSION: 1. Probable contusion posterior to the left hip. No acute fracture or other specific traumatic finding. 2. 3.4 cm solid and cystic pancreatic tail mass. There is some regional fat stranding and indistinguishable margins with the stomach, possibly a  pseudocyst but cystic neoplasm is not excluded. Recommend lab workup and short-term follow-up imaging. 3. Interstitial lung disease and atelectasis. 4. Marked hepatic steatosis. Electronically Signed   By: Monte Fantasia M.D.   On: 01/26/2020 07:21   CT Cervical Spine Wo Contrast  Result Date: 01/26/2020 CLINICAL DATA:  Fall EXAM: CT HEAD WITHOUT CONTRAST CT MAXILLOFACIAL WITHOUT CONTRAST CT CERVICAL SPINE WITHOUT CONTRAST TECHNIQUE: Multidetector CT imaging of the head, cervical spine, and maxillofacial structures were performed using the standard protocol without intravenous contrast. Multiplanar CT image reconstructions of the cervical spine and maxillofacial structures were also generated. COMPARISON:  None. FINDINGS: CT HEAD FINDINGS Brain: There is no mass, hemorrhage or extra-axial collection. The size and  configuration of the ventricles and extra-axial CSF spaces are normal. There is hypoattenuation of the periventricular white matter, most commonly indicating chronic ischemic microangiopathy. Vascular: Atherosclerotic calcification of the vertebral and internal carotid arteries at the skull base. No abnormal hyperdensity of the major intracranial arteries or dural venous sinuses. Skull: Right frontal scalp hematoma.  No skull fracture. CT MAXILLOFACIAL FINDINGS Osseous: --Complex facial fracture types: No LeFort, zygomaticomaxillary complex or nasoorbitoethmoidal fracture. --Simple fracture types: None. --Mandible: No fracture or dislocation. Orbits: The globes are intact. Normal appearance of the intra- and extraconal fat. Symmetric extraocular muscles and optic nerves. Sinuses: Right maxillary retention cysts. Soft tissues: Normal visualized extracranial soft tissues. CT CERVICAL SPINE FINDINGS Alignment: Grade 1 anterolisthesis at C4-5 Skull base and vertebrae: No acute fracture. Soft tissues and spinal canal: No prevertebral fluid or swelling. No visible canal hematoma. 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 Facet arthrosis is worst at left C4-5 as well. Upper chest: No pneumothorax, pulmonary nodule or pleural effusion. Other: Normal visualized paraspinal cervical soft tissues. IMPRESSION: 1. Chronic ischemic microangiopathy without acute intracranial abnormality. 2. Right frontal scalp hematoma without skull fracture or facial fracture. 3. No acute fracture or static subluxation of the cervical spine. 4. 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 Electronically Signed   By: Ulyses Jarred M.D.   On: 01/26/2020 03:38   CT ABDOMEN PELVIS W CONTRAST  Result Date: 01/26/2020 CLINICAL DATA:  Chest trauma, moderate to severe.  Fall in bathroom EXAM: CT CHEST, ABDOMEN, AND PELVIS WITH CONTRAST TECHNIQUE: Multidetector CT imaging of the chest, abdomen and pelvis was performed following the standard protocol  during bolus administration of intravenous contrast. CONTRAST:  153mL OMNIPAQUE IOHEXOL 300 MG/ML  SOLN COMPARISON:  Chest CTA 10/25/2019 FINDINGS: CT CHEST FINDINGS Cardiovascular: Normal heart size. No pericardial effusion. Coronary atherosclerosis. No evidence of great vessel injury Mediastinum/Nodes: No pneumomediastinum or hematoma Lungs/Pleura: Streaky opacity from atelectasis. Given similar appearing reticulation on prior study there may also be interstitial lung disease. No honeycombing. No pleural effusion. Musculoskeletal: Right glenohumeral arthroplasty with joint effusion. Diffuse degenerative thoracic endplate spurring. Remote lower and posterior right rib fractures. No acute fracture or erosion. CT ABDOMEN PELVIS FINDINGS Hepatobiliary: Marked hepatic steatosis. Pancreas: Solid and cystic mass measuring 3.4 cm at the tail, projecting superiorly. Mild adjacent fat stranding and no distinguishable plane between the mass in the stomach. Closely neighboring splenic artery without signs of pseudoaneurysm. Spleen: No splenic injury or perisplenic hematoma. Adrenals/Urinary Tract: No adrenal hemorrhage or renal injury identified. Bladder is unremarkable. 2.5 cm right renal cyst with simple CT appearance Stomach/Bowel: No evidence of injury. Indistinguishable greater curvature stomach and splenic tail mass, as above. Vascular/Lymphatic: Diffuse atheromatous calcification. Reproductive: Negative Other: No ascites or pneumoperitoneum Musculoskeletal: Soft tissue contusion posterior and lateral to the left hip. No acute  fracture. Advanced lumbar spine degeneration. IMPRESSION: 1. Probable contusion posterior to the left hip. No acute fracture or other specific traumatic finding. 2. 3.4 cm solid and cystic pancreatic tail mass. There is some regional fat stranding and indistinguishable margins with the stomach, possibly a pseudocyst but cystic neoplasm is not excluded. Recommend lab workup and short-term follow-up  imaging. 3. Interstitial lung disease and atelectasis. 4. Marked hepatic steatosis. Electronically Signed   By: Monte Fantasia M.D.   On: 01/26/2020 07:21   DG Chest Port 1 View  Result Date: 01/26/2020 CLINICAL DATA:  Fall.  Level 2 trauma. EXAM: PORTABLE CHEST 1 VIEW COMPARISON:  12/14/2019 FINDINGS: Shallow inspiration. Linear atelectasis in the lung bases. No airspace disease or consolidation. Probable emphysematous changes in the lungs. Peribronchial thickening and interstitial changes likely representing chronic bronchitis. No pleural effusions. No pneumothorax. Mediastinal contours appear intact. Normal heart size. Postoperative changes in the right shoulder. Degenerative changes in the spine. IMPRESSION: Shallow inspiration with linear atelectasis in the lung bases. Emphysematous changes and fibrosis in the lungs, likely chronic bronchitis. Electronically Signed   By: Lucienne Capers M.D.   On: 01/26/2020 02:35   DG Shoulder Right Port  Result Date: 01/26/2020 CLINICAL DATA:  Post reduction EXAM: PORTABLE RIGHT SHOULDER COMPARISON:  Plain film of the RIGHT shoulder from earlier same day. FINDINGS: RIGHT shoulder arthroplasty hardware now appears appropriately aligned. No evidence of acute osseous fracture or dislocation. IMPRESSION: Successful reduction of the RIGHT shoulder arthroplasty. Electronically Signed   By: Franki Cabot M.D.   On: 01/26/2020 06:36   CT Maxillofacial Wo Contrast  Result Date: 01/26/2020 CLINICAL DATA:  Fall EXAM: CT HEAD WITHOUT CONTRAST CT MAXILLOFACIAL WITHOUT CONTRAST CT CERVICAL SPINE WITHOUT CONTRAST TECHNIQUE: Multidetector CT imaging of the head, cervical spine, and maxillofacial structures were performed using the standard protocol without intravenous contrast. Multiplanar CT image reconstructions of the cervical spine and maxillofacial structures were also generated. COMPARISON:  None. FINDINGS: CT HEAD FINDINGS Brain: There is no mass, hemorrhage or  extra-axial collection. The size and configuration of the ventricles and extra-axial CSF spaces are normal. There is hypoattenuation of the periventricular white matter, most commonly indicating chronic ischemic microangiopathy. Vascular: Atherosclerotic calcification of the vertebral and internal carotid arteries at the skull base. No abnormal hyperdensity of the major intracranial arteries or dural venous sinuses. Skull: Right frontal scalp hematoma.  No skull fracture. CT MAXILLOFACIAL FINDINGS Osseous: --Complex facial fracture types: No LeFort, zygomaticomaxillary complex or nasoorbitoethmoidal fracture. --Simple fracture types: None. --Mandible: No fracture or dislocation. Orbits: The globes are intact. Normal appearance of the intra- and extraconal fat. Symmetric extraocular muscles and optic nerves. Sinuses: Right maxillary retention cysts. Soft tissues: Normal visualized extracranial soft tissues. CT CERVICAL SPINE FINDINGS Alignment: Grade 1 anterolisthesis at C4-5 Skull base and vertebrae: No acute fracture. Soft tissues and spinal canal: No prevertebral fluid or swelling. No visible canal hematoma. 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 Facet arthrosis is worst at left C4-5 as well. Upper chest: No pneumothorax, pulmonary nodule or pleural effusion. Other: Normal visualized paraspinal cervical soft tissues. IMPRESSION: 1. Chronic ischemic microangiopathy without acute intracranial abnormality. 2. Right frontal scalp hematoma without skull fracture or facial fracture. 3. No acute fracture or static subluxation of the cervical spine. 4. 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 Electronically Signed   By: Ulyses Jarred M.D.   On: 01/26/2020 03:38    EKG: Independently reviewed.  NSR with rate 100; RBBB with no evidence of acute ischemia   Labs on  Admission: I have personally reviewed the available labs and imaging studies at the time of the admission.  Pertinent labs:   Na++ 131 CO2  20 Glucose 134 BUN 16/Creatinine 1.32/GFR 59; 7/0.8/108 in 11/2019 [place] gap 16 Albumin 2.9 AST 149/ALT 75/Bili 1.8 Lactate 4.6 -> 2.1 WBC 6.9 Hgb 10.1; 10.9 on 7/29 Platelets 69 INR 1.1 ETOH 224 HS troponin 22 COVID/flu negative   Assessment/Plan Principal Problem:   Fall at home, initial encounter Active Problems:   Dislocation of prosthetic shoulder joint, initial encounter ([place])   Essential hypertension   Dyslipidemia   Chronic systolic (congestive) heart failure (HCC)   Alcohol dependence ([place])   Hypotension   Tobacco dependence    Fall -Patient reports losing balance and falling; he had been drinking prior and had significantly elevated BAL which likely contributed -He has no significant injuries but had a number of findings on imaging which will need inpatient vs. 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  -R shoulder arthroplasty with dislocation, now reduced (see below) -For now, will observe in progressive care due to recurrent hypotension (see below) and anticipate d/c tomorrow if BPs have stabilized -PT/OT consult   Hypotension, with h/o HTN -Patient with recurrent hypotension -This is likely associated with significant blood loss (lac thought to lead to arterial bleeding, per EMS report) and hypovolemia in the setting of ETOH intoxication -Will rehydrate and continue to follow in progressive care unit overnight (or until BP is stabilized) -Hold home BP meds - Coreg, Cozaar, Micardis -At the time of d/c, he should only be taking one ARB medication and so either Cozaar or Micardis should be stopped  Chronic systolic CHF -Patient was hospitalized with this issue in July and EF was 25-30% at that time -Will need judicious volume repletion with ongoing overnight monitoring -Appears to be compensated at this time  ETOH dependence -Patient with chronic ETOH dependence -Number of drinks  per day: reports 2, but he also reports that this was his usual intake and his BAL was markedly elevated -Number of admissions for management of alcohol withdrawal syndrome (detox): 1 -History of withdrawal seizures, ICU admissions, DTs:1 -Patient is exhibiting active s/sx of withdrawal, CIWA score is not recorded -BAL on admission: 224 -He is at high risk for complications of withdrawal including seizures, DTs; he is at high risk for needing Precedex to help get through DTs -Will observe in progressive care at this time -CIWA protocol -Folate, thiamine, and MVI ordered -Will provide fixed-dose (Valium 5 mg PO q6h) and also symptom-triggered BZD (Ativan per CIWA protocol) given his high BAL level; h/o severe withdrawal; and/or current signs of severe withdrawal. -TOC team consult for possible inpatient treatment -Likely needs 39-month driving restriction after discharge -Elevated LFTs are likely related to alcoholism -Consider offering a medication for Alcohol Use Disorder at the time of d/c, to include Disulfuram; Naltrexone; or Acamprosate.  Thrombocytopenia - Due to bone marrow suppression from alcohol abuse - Monitor daily platelet count   Prosthetic shoulder dislocation -Patient with prior h/o shoulder replacement -He had an anterior dislocation that was reduced in the ER -He reports no further pain at this time -Continue sling -He needs to see Dr. Griffin Basil Tuesday AM in his office if he is out of the hospital (d/w Dr. Noemi Chapel)  Tobacco dependence -Encourage cessation.   -This was discussed with the patient and should be reviewed on an ongoing basis.   -Patch ordered at patient request. -  Underlying ILD is likely related to smoking history.    Note: This patient has been tested and is negative for the novel coronavirus COVID-19. The patient has not been vaccinated against COVID-19 and does not desire vaccination at this time.    DVT prophylaxis:   Lovenox  Code Status:  Full -  confirmed with patient Family Communication: None present Disposition Plan:  The patient is from: home  Anticipated d/c is to: home, possibly with [facility] services  Anticipated d/c date will depend on clinical response to treatment, but possibly as early as tomorrow if he has excellent response to treatment  Patient is currently: acutely ill Consults called: Orthopedics (telephone only); nutrition; PT/OT; TOC team  Admission status:  It is my clinical opinion that referral for OBSERVATION is reasonable and necessary in this patient based on the above information provided. The aforementioned taken together are felt to place the patient at high risk for further clinical deterioration. However it is anticipated that the patient may be medically stable for discharge from the hospital within 24 to 48 hours.    Karmen Bongo MD [facility]   How to contact the [facility] Attending or Consulting provider [place] or covering provider during after hours [place], for this patient?  1. Check the care team in [facility] and look for a) attending/consulting [facility] provider listed and b) the [facility] team listed 2. Log into [email or web address] and use [facility]'s universal password to access. If you do not have the password, please contact the hospital operator. 3. Locate the [facility] provider you are looking for under [facility] and page to a number that you can be directly reached. 4. If you still have difficulty reaching the provider, please page the [facility] (Director on Call) for the Hospitalists listed on amion for assistance.   01/26/2020, 4:34 PM

## 2020-01-26 NOTE — ED Provider Notes (Signed)
[facility] EMERGENCY DEPARTMENT Provider Note   CSN: [id number] Arrival date & time: 01/26/20  9233     History Chief Complaint  Patient presents with  . Fall    Fernando [name] is a 64 y.o. male.  Patient is a 64 year old male with unknown past medical history brought by EMS for evaluation of fall.  Patient apparently fell in the bathroom and struck his head on the floor.  It sounds as though he was unconscious for a period of time as he had significant bleeding from his forehead.  I am told he was initially hypotensive with blood pressure in the 50s.  He was given normal saline in route and blood pressure improved.  Patient was made a level 1 trauma due to blood loss and hypotension.  Patient arrives complaining only of pain in his head and face.  The history is provided by the patient.  Fall This is a new problem. The current episode started less than 1 hour ago. The problem occurs constantly. The problem has not changed since onset.Nothing aggravates the symptoms. Nothing relieves the symptoms. He has tried nothing for the symptoms.       No past medical history on file.  There are no problems to display for this patient.        No family history on file.  Social History   Tobacco Use  . Smoking status: Not on file  Substance Use Topics  . Alcohol use: Not on file  . Drug use: Not on file    Home Medications Prior to Admission medications   Not on File    Allergies    Patient has no allergy information on record.  Review of Systems   Review of Systems  All other systems reviewed and are negative.   Physical Exam Updated Vital Signs BP 100/64 (BP Location: Left Arm)   Pulse 94   Temp (!) 96.3 F (35.7 C) (Temporal)   Resp (!) 21   Ht 5\' 7"  (1.702 m)   Wt 76.2 kg   SpO2 95%   BMI 26.31 kg/m   Physical Exam Vitals and nursing note reviewed.  Constitutional:      General: He is not in acute distress.    Appearance: He is  well-developed. He is not diaphoretic.  HENT:     Head: Normocephalic.     Comments: There is a 5 cm laceration to the right forehead just above the right eyebrow.  There appears to be arterial bleeding from this wound.  There is also a small, less than 1 cm laceration to the bridge of the nose.  There is swelling of the bridge of the nose and ecchymosis below the right eye. Eyes:     Extraocular Movements: Extraocular movements intact.     Pupils: Pupils are equal, round, and reactive to light.  Cardiovascular:     Rate and Rhythm: Normal rate and regular rhythm.     Heart sounds: No murmur heard.  No friction rub.  Pulmonary:     Effort: Pulmonary effort is normal. No respiratory distress.     Breath sounds: Normal breath sounds. No wheezing or rales.  Abdominal:     General: Bowel sounds are normal. There is no distension.     Palpations: Abdomen is soft.     Tenderness: There is no abdominal tenderness.  Musculoskeletal:        General: Normal range of motion.     Cervical back: Normal range of  motion and neck supple.  Skin:    General: Skin is warm and dry.  Neurological:     Mental Status: He is alert and oriented to person, place, and time.     Coordination: Coordination normal.     ED Results / Procedures / Treatments   Labs (all labs ordered are listed, but only abnormal results are displayed) Labs Reviewed  I-STAT CHEM 8, ED - Abnormal; Notable for the following components:      Result Value   Sodium 131 (*)    Chloride 93 (*)    Creatinine, Ser 1.40 (*)    Glucose, Bld 128 (*)    Calcium, Ion 1.02 (*)    TCO2 19 (*)    Hemoglobin 10.2 (*)    HCT 30.0 (*)    All other components within normal limits  RESPIRATORY PANEL BY RT PCR (FLU A&B, COVID)  COMPREHENSIVE METABOLIC PANEL  CBC  ETHANOL  URINALYSIS, ROUTINE W REFLEX MICROSCOPIC  LACTIC ACID, PLASMA  PROTIME-INR  SAMPLE TO BLOOD BANK  TYPE AND SCREEN    EKG EKG Interpretation  Date/Time:   Saturday January 26 2020 06:27:01 EDT Ventricular Rate:  100 PR Interval:    QRS Duration: 164 QT Interval:  414 QTC Calculation: 534 R Axis:   72 Text Interpretation: Sinus tachycardia Right bundle branch block No prior ecg for comparison Confirmed by Veryl Speak ([id number]) on 01/26/2020 6:31:08 AM   Radiology DG Chest Port 1 View  Result Date: 01/26/2020 CLINICAL DATA:  Fall.  Level 2 trauma. EXAM: PORTABLE CHEST 1 VIEW COMPARISON:  12/14/2019 FINDINGS: Shallow inspiration. Linear atelectasis in the lung bases. No airspace disease or consolidation. Probable emphysematous changes in the lungs. Peribronchial thickening and interstitial changes likely representing chronic bronchitis. No pleural effusions. No pneumothorax. Mediastinal contours appear intact. Normal heart size. Postoperative changes in the right shoulder. Degenerative changes in the spine. IMPRESSION: Shallow inspiration with linear atelectasis in the lung bases. Emphysematous changes and fibrosis in the lungs, likely chronic bronchitis. Electronically Signed   By: Lucienne Capers M.D.   On: 01/26/2020 02:35    Procedures Reduction of dislocation  Date/Time: 01/26/2020 6:33 AM Performed by: Veryl Speak, MD Authorized by: Veryl Speak, MD  Consent: Verbal consent obtained. Consent given by: patient Patient understanding: patient states understanding of the procedure being performed Procedure consent: procedure consent matches procedure scheduled Imaging studies: imaging studies available Patient identity confirmed: verbally with patient Time out: Immediately prior to procedure a "time out" was called to verify the correct patient, procedure, equipment, support staff and site/side marked as required. Local anesthesia used: no  Anesthesia: Local anesthesia used: no  Sedation: Patient sedated: no  Patient tolerance: patient tolerated the procedure well with no immediate complications Comments: Patient's prosthetic  shoulder dislocation was easily relocated using traction.  No sedation was necessary and patient tolerated this very well.  Successful reduction confirmed with post reduction films.    (including critical care time)  Medications Ordered in ED Medications  lidocaine (PF) (XYLOCAINE) 1 % injection (has no administration in time range)  sodium chloride 0.9 % bolus 1,000 mL (has no administration in time range)    ED Course  I have reviewed the triage vital signs and the nursing notes.  Pertinent labs & imaging results that were available during my care of the patient were reviewed by me and considered in my medical decision making (see chart for details).    MDM Rules/Calculators/A&P  Patient is a 64 year old male with past medical  history of congestive heart failure with reduced EF, hypertension, and chronic alcohol use.  He presents today for evaluation of fall.  Patient was at home this evening when he apparently fell or passed out in the bathroom and struck his head on the floor.  I suspect he was unconscious for several minutes as he lost a significant amount of blood from a laceration on his forehead.  He was initially found by EMS to have low blood pressure and a level 1 trauma was initiated.  Patient arrived here with systolic blood pressure over 100 and bleeding mostly controlled.  He had a laceration to the right forehead just above his eyebrow which was repaired as per PA Jarrett Soho Muthersbaugh's note.    Patient sent for CT scan of the head and cervical spine, both of which were unremarkable.  He remained hypotensive and received several boluses of normal saline.  His initial lactate was 4.5 and did improve to 2.1 after fluid resuscitation, however his blood pressures have remained somewhat low.  Patient did complain of pain in his right shoulder.  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  I was able to reduce it easily with traction  countertraction without the necessity of sedation.  Successful reduction was confirmed with postreduction films.  Arm placed in a sling.  Due to his hypotension of undetermined etiology, CT scans of the chest, abdomen, and pelvis were also obtained and did not show an acute intrathoracic or intra-abdominal process.  His EKG shows a right bundle branch block with no acute abnormality.  Troponin mildly elevated, the significance of which I am uncertain.  Due to his hypotension, I feel as the patient will require admission for observation and possibly further work-up.  I am awaiting a call from the hospitalist to arrange for admission.  CRITICAL CARE Performed by: Veryl Speak Total critical care time: 70 minutes Critical care time was exclusive of separately billable procedures and treating other patients. Critical care was necessary to treat or prevent imminent or life-threatening deterioration. Critical care was time spent personally by me on the following activities: development of treatment plan with patient and/or surrogate as well as nursing, discussions with consultants, evaluation of patient's response to treatment, examination of patient, obtaining history from patient or surrogate, ordering and performing treatments and interventions, ordering and review of laboratory studies, ordering and review of radiographic studies, pulse oximetry and re-evaluation of patient's condition.   Final Clinical Impression(s) / ED Diagnoses Final diagnoses:  Fall    Rx / DC Orders ED Discharge Orders    None       Veryl Speak, MD 01/26/20 [id number]

## 2020-01-26 NOTE — Progress Notes (Addendum)
Received from ED, pt alert, not in distress, no complaints of pain. Placed on low bed.

## 2020-01-26 NOTE — ED Notes (Signed)
Jocelyn Lamer, emergency contact, [phone] would like an update when available

## 2020-01-26 NOTE — ED Triage Notes (Signed)
Pt from home via [facility] for eval of mechanical fall in bathroom; pt states he "lost balance & hit his head," denies use of thinners but endorses "aggressive" use of alcohol tonight & every night. Per EMS, significant (approx 715mL) blood loss, denies LOC Initial BP 50systolic, after [id number] Initial O2 01%, 6L O2 brought O2 to 95%  18g L AC

## 2020-01-26 NOTE — Progress Notes (Signed)
Orthopedic Tech Progress Note Patient Details:  Fernando [name] 04/05/1877 [id number] Level 1 Trauma  Patient ID: Fernando [name], male   DOB: 04/05/1877, 64 y.o.   MRN: [id number]   Fernando [name] 01/26/2020, 2:17 AM

## 2020-01-26 NOTE — ED Notes (Signed)
Warmed fluid bolus hung by Threasa Beards RN after VRBO from [facility]

## 2020-01-26 NOTE — Evaluation (Signed)
Physical Therapy Evaluation Patient Details Name: Fernando [name] MRN: [id number] DOB: 1955/05/22 Today's Date: 01/26/2020   History of Present Illness  Pt is a 64 y.o. M with significant PMH of CHF with reduced EF, hypertension and chronic alcohol use. Presents after a fall. Found to have right shoulder dislocation and subsequently reduced. 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   Clinical Impression  Prior to admission, pt lives alone and is independent. He does have a neighbor who is available to assist intermittently. Pt presents with decreased functional mobility secondary to poor balance, ataxia, weakness, right shoulder pain, decreased functional use of RUE and decreased cognition. PT adjusted sling fit for optimal positioning/comfort. Pt requiring moderate assist for bed mobility and unable to stand. HR 118 bpm at rest, up to 160 bpm with sitting edge of bed. Pt denies chest pain, but endorses mild shortness of breath. SpO2 96% on 2L O2. RN present and aware. Pt states he typically has 3 liquor drinks daily; denies history of prior falls. Pt presents as a high fall risk in setting of recent fall, decreased coordination/ataxia, and decreased safety awareness. Given both deficits and decreased caregiver support, recommending SNF at discharge.     Follow Up Recommendations SNF    Equipment Recommendations  Other (comment) (TBA)    Recommendations for Other Services       Precautions / Restrictions Precautions Precautions: Fall;Other (comment) Precaution Comments: watch HR Required Braces or Orthoses: Sling Restrictions Weight Bearing Restrictions: Yes RUE Weight Bearing: Non weight bearing      Mobility  Bed Mobility Overal bed mobility: Needs Assistance Bed Mobility: Supine to Sit;Sit to Supine     Supine to sit: Mod assist Sit to supine: Mod assist   General bed mobility comments: ModA for supine <> sit, decreased coordination     Transfers                  General transfer comment: unable to stand from stretcher  Ambulation/Gait                Stairs            Wheelchair Mobility    Modified Rankin (Stroke Patients Only)       Balance Overall balance assessment: Needs assistance Sitting-balance support: Feet supported Sitting balance-Leahy Scale: Fair                                       Pertinent Vitals/Pain Pain Assessment: Faces Faces Pain Scale: Hurts even more Pain Location: R shoulder Pain Descriptors / Indicators: Discomfort;Grimacing;Guarding Pain Intervention(s): Monitored during session;Limited activity within patient's tolerance    Home Living Family/patient expects to be discharged to:: Private residence Living Arrangements: Alone Available Help at Discharge: Neighbor Type of Home: House Home Access: Stairs to enter   Technical brewer of Steps: 1 Home Layout: One level        Prior Function Level of Independence: Independent         Comments: Retired, used to work building houses     Journalist, newspaper        Extremity/Trunk Assessment   Upper Extremity Assessment Upper Extremity Assessment: RUE deficits/detail;LUE deficits/detail RUE Deficits / Details: Shoulder dislocation s/p reduction LUE Coordination: decreased gross motor    Lower Extremity Assessment Lower Extremity Assessment: RLE deficits/detail;LLE deficits/detail RLE Deficits / Details: Strength 5/5 RLE Coordination: decreased gross motor LLE Deficits /  Details: Strength 5/5 LLE Coordination: decreased gross motor       Communication   Communication: No difficulties  Cognition Arousal/Alertness: Awake/alert Behavior During Therapy: Flat affect Overall Cognitive Status: Impaired/Different from baseline Area of Impairment: Memory;Safety/judgement                     Memory: Decreased short-term memory   Safety/Judgement: Decreased awareness of safety;Decreased awareness of  deficits     General Comments: Pt with decreased recall regarding events surrounding fall (does admit to drinking around time of event). Decreased awareness of safety/deficits      General Comments      Exercises     Assessment/Plan    PT Assessment Patient needs continued PT services  PT Problem List Decreased strength;Decreased range of motion;Decreased activity tolerance;Decreased balance;Decreased mobility;Decreased coordination;Decreased cognition;Decreased safety awareness;Cardiopulmonary status limiting activity;Pain       PT Treatment Interventions DME instruction;Gait training;Stair training;Functional mobility training;Therapeutic activities;Therapeutic exercise;Balance training;Patient/family education    PT Goals (Current goals can be found in the Care Plan section)  Acute Rehab PT Goals Patient Stated Goal: did not state PT Goal Formulation: With patient Time For Goal Achievement: 02/09/20 Potential to Achieve Goals: Good    Frequency Min 3X/week   Barriers to discharge        Co-evaluation               AM-PAC PT "6 Clicks" Mobility  Outcome Measure Help needed turning from your back to your side while in a flat bed without using bedrails?: A Little Help needed moving from lying on your back to sitting on the side of a flat bed without using bedrails?: A Lot Help needed moving to and from a bed to a chair (including a wheelchair)?: Total Help needed standing up from a chair using your arms (e.g., wheelchair or bedside chair)?: Total Help needed to walk in hospital room?: Total Help needed climbing 3-5 steps with a railing? : Total 6 Click Score: 9    End of Session Equipment Utilized During Treatment: Other (comment) (sling) Activity Tolerance: Other (comment) (tachycardia) Patient left: in bed;with call bell/phone within reach Nurse Communication: Mobility status;Other (comment) (HR) PT Visit Diagnosis: Unsteadiness on feet (R26.81);Other  abnormalities of gait and mobility (R26.89);History of falling (Z91.81);Difficulty in walking, not elsewhere classified (R26.2);Pain Pain - Right/Left: Right Pain - part of body: Shoulder    Time: 9892-1194 PT Time Calculation (min) (ACUTE ONLY): 20 min   Charges:   PT Evaluation $PT Eval Moderate Complexity: 1 Mod          Wyona Almas, PT, DPT Acute Rehabilitation Services Pager [phone] Office [phone]   Deno Etienne 01/26/2020, 3:00 PM

## 2020-01-27 DIAGNOSIS — N179 Acute kidney failure, unspecified: Secondary | ICD-10-CM | POA: Diagnosis present

## 2020-01-27 DIAGNOSIS — D62 Acute posthemorrhagic anemia: Secondary | ICD-10-CM | POA: Diagnosis present

## 2020-01-27 DIAGNOSIS — Z20822 Contact with and (suspected) exposure to covid-19: Secondary | ICD-10-CM | POA: Diagnosis present

## 2020-01-27 DIAGNOSIS — I10 Essential (primary) hypertension: Secondary | ICD-10-CM | POA: Diagnosis not present

## 2020-01-27 DIAGNOSIS — W1830XA Fall on same level, unspecified, initial encounter: Secondary | ICD-10-CM | POA: Diagnosis present

## 2020-01-27 DIAGNOSIS — K869 Disease of pancreas, unspecified: Secondary | ICD-10-CM | POA: Diagnosis present

## 2020-01-27 DIAGNOSIS — D539 Nutritional anemia, unspecified: Secondary | ICD-10-CM | POA: Diagnosis present

## 2020-01-27 DIAGNOSIS — E861 Hypovolemia: Secondary | ICD-10-CM | POA: Diagnosis present

## 2020-01-27 DIAGNOSIS — Y92002 Bathroom of unspecified non-institutional (private) residence single-family (private) house as the place of occurrence of the external cause: Secondary | ICD-10-CM | POA: Diagnosis not present

## 2020-01-27 DIAGNOSIS — Y92009 Unspecified place in unspecified non-institutional (private) residence as the place of occurrence of the external cause: Secondary | ICD-10-CM | POA: Diagnosis not present

## 2020-01-27 DIAGNOSIS — K7010 Alcoholic hepatitis without ascites: Secondary | ICD-10-CM | POA: Diagnosis not present

## 2020-01-27 DIAGNOSIS — S01111A Laceration without foreign body of right eyelid and periocular area, initial encounter: Secondary | ICD-10-CM | POA: Diagnosis present

## 2020-01-27 DIAGNOSIS — K921 Melena: Secondary | ICD-10-CM | POA: Diagnosis present

## 2020-01-27 DIAGNOSIS — R000 Tachycardia, unspecified: Secondary | ICD-10-CM | POA: Diagnosis not present

## 2020-01-27 DIAGNOSIS — I5021 Acute systolic (congestive) heart failure: Secondary | ICD-10-CM | POA: Diagnosis not present

## 2020-01-27 DIAGNOSIS — F10231 Alcohol dependence with withdrawal delirium: Secondary | ICD-10-CM | POA: Diagnosis not present

## 2020-01-27 DIAGNOSIS — M25511 Pain in right shoulder: Secondary | ICD-10-CM | POA: Diagnosis present

## 2020-01-27 DIAGNOSIS — I5043 Acute on chronic combined systolic (congestive) and diastolic (congestive) heart failure: Secondary | ICD-10-CM | POA: Diagnosis present

## 2020-01-27 DIAGNOSIS — F10229 Alcohol dependence with intoxication, unspecified: Secondary | ICD-10-CM | POA: Diagnosis present

## 2020-01-27 DIAGNOSIS — Y907 Blood alcohol level of 200-239 mg/100 ml: Secondary | ICD-10-CM | POA: Diagnosis present

## 2020-01-27 DIAGNOSIS — J690 Pneumonitis due to inhalation of food and vomit: Secondary | ICD-10-CM | POA: Diagnosis present

## 2020-01-27 DIAGNOSIS — R0600 Dyspnea, unspecified: Secondary | ICD-10-CM | POA: Diagnosis not present

## 2020-01-27 DIAGNOSIS — K8689 Other specified diseases of pancreas: Secondary | ICD-10-CM | POA: Diagnosis not present

## 2020-01-27 DIAGNOSIS — E785 Hyperlipidemia, unspecified: Secondary | ICD-10-CM | POA: Diagnosis present

## 2020-01-27 DIAGNOSIS — Y792 Prosthetic and other implants, materials and accessory orthopedic devices associated with adverse incidents: Secondary | ICD-10-CM | POA: Diagnosis present

## 2020-01-27 DIAGNOSIS — I110 Hypertensive heart disease with heart failure: Secondary | ICD-10-CM | POA: Diagnosis present

## 2020-01-27 DIAGNOSIS — J9601 Acute respiratory failure with hypoxia: Secondary | ICD-10-CM | POA: Diagnosis present

## 2020-01-27 DIAGNOSIS — I959 Hypotension, unspecified: Secondary | ICD-10-CM | POA: Diagnosis present

## 2020-01-27 DIAGNOSIS — D696 Thrombocytopenia, unspecified: Secondary | ICD-10-CM | POA: Diagnosis present

## 2020-01-27 DIAGNOSIS — I5022 Chronic systolic (congestive) heart failure: Secondary | ICD-10-CM | POA: Diagnosis not present

## 2020-01-27 DIAGNOSIS — D638 Anemia in other chronic diseases classified elsewhere: Secondary | ICD-10-CM | POA: Diagnosis not present

## 2020-01-27 DIAGNOSIS — F17210 Nicotine dependence, cigarettes, uncomplicated: Secondary | ICD-10-CM | POA: Diagnosis present

## 2020-01-27 DIAGNOSIS — W19XXXA Unspecified fall, initial encounter: Secondary | ICD-10-CM | POA: Diagnosis not present

## 2020-01-27 DIAGNOSIS — G9341 Metabolic encephalopathy: Secondary | ICD-10-CM | POA: Diagnosis present

## 2020-01-27 DIAGNOSIS — Z96611 Presence of right artificial shoulder joint: Secondary | ICD-10-CM | POA: Diagnosis present

## 2020-01-27 DIAGNOSIS — K3189 Other diseases of stomach and duodenum: Secondary | ICD-10-CM | POA: Diagnosis not present

## 2020-01-27 DIAGNOSIS — E871 Hypo-osmolality and hyponatremia: Secondary | ICD-10-CM | POA: Diagnosis present

## 2020-01-27 DIAGNOSIS — T84028A Dislocation of other internal joint prosthesis, initial encounter: Secondary | ICD-10-CM | POA: Diagnosis present

## 2020-01-27 DIAGNOSIS — I4510 Unspecified right bundle-branch block: Secondary | ICD-10-CM | POA: Diagnosis present

## 2020-01-27 DIAGNOSIS — S0121XA Laceration without foreign body of nose, initial encounter: Secondary | ICD-10-CM | POA: Diagnosis present

## 2020-01-27 DIAGNOSIS — J189 Pneumonia, unspecified organism: Secondary | ICD-10-CM | POA: Diagnosis not present

## 2020-01-27 DIAGNOSIS — K766 Portal hypertension: Secondary | ICD-10-CM | POA: Diagnosis present

## 2020-01-27 DIAGNOSIS — F1029 Alcohol dependence with unspecified alcohol-induced disorder: Secondary | ICD-10-CM | POA: Diagnosis not present

## 2020-01-27 LAB — CBC
HCT: 23.8 % — ABNORMAL LOW (ref 39.0–52.0)
Hemoglobin: 8.3 g/dL — ABNORMAL LOW (ref 13.0–17.0)
MCH: 37.2 pg — ABNORMAL HIGH (ref 26.0–34.0)
MCHC: 34.9 g/dL (ref 30.0–36.0)
MCV: 106.7 fL — ABNORMAL HIGH (ref 80.0–100.0)
Platelets: 39 10*3/uL — ABNORMAL LOW (ref 150–400)
RBC: 2.23 MIL/uL — ABNORMAL LOW (ref 4.22–5.81)
RDW: 14.1 % (ref 11.5–15.5)
WBC: 4.1 10*3/uL (ref 4.0–10.5)
nRBC: 0 % (ref 0.0–0.2)

## 2020-01-27 LAB — BASIC METABOLIC PANEL
Anion gap: 15 (ref 5–15)
BUN: 9 mg/dL (ref 8–23)
CO2: 23 mmol/L (ref 22–32)
Calcium: 8.3 mg/dL — ABNORMAL LOW (ref 8.9–10.3)
Chloride: 93 mmol/L — ABNORMAL LOW (ref 98–111)
Creatinine, Ser: 0.94 mg/dL (ref 0.61–1.24)
GFR, Estimated: >60 mL/min (ref >60)
Glucose, Bld: 107 mg/dL — ABNORMAL HIGH (ref 70–99)
Potassium: 2.8 mmol/L — ABNORMAL LOW (ref 3.5–5.1)
Sodium: 131 mmol/L — ABNORMAL LOW (ref 135–145)

## 2020-01-27 LAB — MAGNESIUM: Magnesium: 0.9 mg/dL — CL (ref 1.7–2.4)

## 2020-01-27 MED ORDER — SODIUM CHLORIDE 0.9 % IV SOLN: INTRAVENOUS | Status: DC

## 2020-01-27 MED ORDER — METOPROLOL TARTRATE 5 MG/5ML IV SOLN
5.0000 mg | Freq: Four times a day (QID) | INTRAVENOUS | Status: DC | PRN
  Administered 2020-01-27 (×3): 5 mg via INTRAVENOUS
  Filled 2020-01-27 (×3): qty 5

## 2020-01-27 MED ORDER — SODIUM CHLORIDE 0.9 % IV BOLUS
500.0000 mL | Freq: Once | INTRAVENOUS | Status: AC
  Administered 2020-01-27: 500 mL via INTRAVENOUS

## 2020-01-27 MED ORDER — CHLORDIAZEPOXIDE HCL 25 MG PO CAPS
50.0000 mg | ORAL_CAPSULE | Freq: Two times a day (BID) | ORAL | Status: DC
  Administered 2020-01-27 (×2): 50 mg via ORAL
  Filled 2020-01-27 (×2): qty 2

## 2020-01-27 MED ORDER — POTASSIUM CHLORIDE CRYS ER 20 MEQ PO TBCR
40.0000 meq | EXTENDED_RELEASE_TABLET | Freq: Two times a day (BID) | ORAL | Status: AC
  Administered 2020-01-27 (×2): 40 meq via ORAL
  Filled 2020-01-27 (×2): qty 2

## 2020-01-27 MED ORDER — CARVEDILOL 3.125 MG PO TABS
3.1250 mg | ORAL_TABLET | Freq: Two times a day (BID) | ORAL | Status: DC
  Administered 2020-01-27 – 2020-01-29 (×4): 3.125 mg via ORAL
  Filled 2020-01-27 (×5): qty 1

## 2020-01-27 MED ORDER — MAGNESIUM SULFATE 2 GM/50ML IV SOLN
2.0000 g | Freq: Once | INTRAVENOUS | Status: AC
  Administered 2020-01-27: 2 g via INTRAVENOUS
  Filled 2020-01-27: qty 50

## 2020-01-27 MED ORDER — CHLORDIAZEPOXIDE HCL 25 MG PO CAPS: 25.0000 mg | ORAL_CAPSULE | Freq: Two times a day (BID) | ORAL | Status: DC

## 2020-01-27 NOTE — Progress Notes (Signed)
OT Cancellation Note  Patient Details Name: Fernando [name] MRN: [id number] DOB: Dec 11, 1955   Cancelled Treatment:    Reason Eval/Treat Not Completed: Other (comment); (nursing care), asked OT to check back, will follow up as able for OT eval.   Raymondo Band 01/27/2020, 8:18 AM

## 2020-01-27 NOTE — Evaluation (Signed)
Occupational Therapy Evaluation Patient Details Name: Fernando [name] MRN: [id number] DOB: 02/07/1956 Today's Date: 01/27/2020    History of Present Illness Pt is a 64 y.o. M with significant PMH of CHF with reduced EF, hypertension and chronic alcohol use. Presents after a fall. Found to have right shoulder dislocation and subsequently reduced. 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    Clinical Impression   This 64 y/o male presents with the above. PTA pt living alone and performing ADL and mobility tasks independently. Pt now presenting with the above and below limitations including impaired cognition and pt also limited given lethargy during today's session (per RN had received ativan earlier). Pt requiring up to maxA for UB ADL including sling management, up to [place] for LB ADL and maxA for bed mobility - unable to achieve fully sitting EOB given waxing/waning arousal levels today. Pt to benefit from continued acute OT services and given current deficits recommend follow up therapy services in SNF setting to maximize his overall safety and independence with ADL and mobility.     Follow Up Recommendations  SNF;Supervision/Assistance - 24 hour    Equipment Recommendations  Other (comment) (TBD)           Precautions / Restrictions Precautions Precautions: Fall;Other (comment) Precaution Comments: watch HR Required Braces or Orthoses: Sling Restrictions Weight Bearing Restrictions: Yes RUE Weight Bearing: Non weight bearing Other Position/Activity Restrictions: assume NWB      Mobility Bed Mobility Overal bed mobility: Needs Assistance Bed Mobility: Supine to Sit;Sit to Supine     Supine to sit: Max assist;Total assist Sit to supine: Max assist;Total assist   General bed mobility comments: pt with waxing/waning levels of arousal - would awaken and state he wants to get up but then starts to fall back asleep and with poor attempts to initiate/assist with  transitioning LEs towards EOB or with attempts to roll towards L side. initiated transition to sitting however ultimately had to return to supine given continued lethargy and pt unable to assist     Transfers                 General transfer comment: unable to attempt    Balance                                           ADL either performed or assessed with clinical judgement   ADL Overall ADL's : Needs assistance/impaired Eating/Feeding: Minimal assistance;Sitting   Grooming: Wash/dry face;Maximal assistance;Bed level Grooming Details (indicate cue type and reason): max hand over hand assist to perform task given lethargy and weakness, using LUE to complete  Upper Body Bathing: Maximal assistance;Bed level   Lower Body Bathing: Total assistance   Upper Body Dressing : Maximal assistance;Bed level Upper Body Dressing Details (indicate cue type and reason): for readjusting sling Lower Body Dressing: Total assistance       Toileting- Clothing Manipulation and Hygiene: Total assistance       Functional mobility during ADLs: Maximal assistance (bed mobility only) General ADL Comments: pt limited due to impaired cognition, lethargy during today's session                         Pertinent Vitals/Pain Pain Assessment: No/denies pain     Hand Dominance Right   Extremity/Trunk Assessment Upper Extremity Assessment Upper Extremity Assessment: RUE  deficits/detail RUE Deficits / Details: Shoulder dislocation s/p reduction; pt often moving his RUE despite being in sling today, denies sensation changes. elbow/forearm/wrist/hand appear WFL RUE: Unable to fully assess due to immobilization RUE Sensation: WNL RUE Coordination: decreased gross motor LUE Coordination: decreased gross motor   Lower Extremity Assessment Lower Extremity Assessment: Defer to PT evaluation       Communication Communication Communication: No difficulties   Cognition  Arousal/Alertness: Lethargic Behavior During Therapy: Flat affect Overall Cognitive Status: Impaired/Different from baseline Area of Impairment: Memory;Safety/judgement;Following commands;Problem solving                     Memory: Decreased short-term memory;Decreased recall of precautions Following Commands: Follows one step commands inconsistently;Follows one step commands with increased time Safety/Judgement: Decreased awareness of safety;Decreased awareness of deficits   Problem Solving: Slow processing;Decreased initiation;Requires verbal cues;Requires tactile cues General Comments: pt with waxing/waning arousal today. able to recall event leading up to hospitalization (fall from commode). with poor insight into current deficits. pt also received ativan prior to OT eval, suspect adding to pt lethargy   General Comments  pt with cuts, scabs, bruising to face and bil UEs     Exercises Exercises: General Upper Extremity General Exercises - Upper Extremity Elbow Flexion: AROM;AAROM;Right;10 reps Elbow Extension: AROM;AAROM;Right;10 reps Digit Composite Flexion: AROM;Right;10 reps Composite Extension: AROM;Right;10 reps   Shoulder Instructions      Home Living Family/patient expects to be discharged to:: Private residence Living Arrangements: Alone Available Help at Discharge: Neighbor Type of Home: House Home Access: Stairs to enter Technical brewer of Steps: 1   Home Layout: One level         Biochemist, clinical: Standard                Prior Functioning/Environment Level of Independence: Independent        Comments: Retired, used to work building houses        OT Problem List: Decreased strength;Decreased range of motion;Decreased activity tolerance;Impaired balance (sitting and/or standing);Decreased cognition;Decreased coordination;Pain;Impaired UE functional use;Cardiopulmonary status limiting activity;Decreased knowledge of  precautions;Decreased safety awareness      OT Treatment/Interventions: Self-care/ADL training;Therapeutic exercise;Energy conservation;DME and/or AE instruction;Therapeutic activities;Cognitive remediation/compensation;Visual/perceptual remediation/compensation;Patient/family education;Balance training    OT Goals(Current goals can be found in the care plan section) Acute Rehab OT Goals Patient Stated Goal: did not state OT Goal Formulation: With patient Time For Goal Achievement: 02/10/20 Potential to Achieve Goals: Good  OT Frequency: Min 2X/week   Barriers to D/C:            Co-evaluation              AM-PAC OT "6 Clicks" Daily Activity     Outcome Measure Help from another person eating meals?: A Lot Help from another person taking care of personal grooming?: A Lot Help from another person toileting, which includes using toliet, bedpan, or urinal?: Total Help from another person bathing (including washing, rinsing, drying)?: A Lot Help from another person to put on and taking off regular upper body clothing?: A Lot Help from another person to put on and taking off regular lower body clothing?: Total 6 Click Score: 10   End of Session Equipment Utilized During Treatment: Other (comment) (sling) Nurse Communication: Mobility status  Activity Tolerance: Patient limited by lethargy Patient left: in bed;with call bell/phone within reach;with bed alarm set  OT Visit Diagnosis: Other abnormalities of gait and mobility (R26.89);Other symptoms and signs involving cognitive function  Time: [id number] OT Time Calculation (min): 20 min Charges:  OT General Charges $OT Visit: 1 Visit OT Evaluation $OT Eval Moderate Complexity: 1 Mod  Lou Cal, OT Acute Rehabilitation Services Pager [phone] Office [phone]    Raymondo Band 01/27/2020, 2:11 PM

## 2020-01-27 NOTE — Progress Notes (Addendum)
[facility] PROGRESS NOTE    Progress Note  BRENTON [name]  ASN:[id number] DOB: 09/26/1955 DOA: 01/26/2020 PCP: [name], MD     Brief Narrative:   Fernando [name] is an 64 y.o. male past medical history significant for chronic systolic heart failure with an EF of 25% alcohol dependence with DTs during his admission on 10/23/2019 prostatic shoulder joint infection comes in after a mechanical fall off the commode the day prior to admission.  Assessment/Plan:   Fall at home, initial encounter Patient reported losing balance (he was inebriated). Physical therapy evaluated the patient and recommended skilled nursing facility.  Essential hypertension, with recurrent hypotension in the ED: In the setting of alcohol intoxication leading to hypovolemia with acute kidney injury on presentation. Antihypertensive medications were held improved nicely.  We will continue IV fluid hydration for the next 24 hours. We will restart his Coreg as his blood pressure is trending up. Continue IV fluids for an additional 24 hours.  Acute kidney injury: Likely prerenal azotemia in the setting of alcohol use, ARB, he is currently on 2 ARB's will have to discontinue 1. He returned to baseline after IV fluid hydration. Recheck renal panel tomorrow morning and a magnesium today.  Alcohol dependence acute metabolic encephalopathy: He drinks about 6 drinks per day his alcohol levels elevated on admission. He has had previous history of DTs with admission to the ICU this year. He is going to withdrawal, will start him on IV fluids, Agree with CIWA protocol and thiamine schedule. We will go ahead and discontinue Xanax and start him on Librium schedule for 2 days.  Chronic thrombocytopenia: Likely bone marrow suppression from alcohol abuse he does have an elevated MCV and macrocytic anemia which is likely due to alcohol also.  Prosthetic shoulder dislocation: Current anterior dislocation reduced  in the ED. He will need to see Dr. Griffin Basil Tuesday morning at his office as an outpatient.  Tobacco dependence: Cessation was encouraged.  Unstageable pressure ulcer present on admission RN Pressure Injury Documentation: Pressure Injury 01/26/20 Buttocks Right Deep Tissue Pressure Injury - Purple or maroon localized area of discolored intact skin or blood-filled blister due to damage of underlying soft tissue from pressure and/or shear. (Active)  01/26/20 2000  Location: Buttocks  Location Orientation: Right  Staging: Deep Tissue Pressure Injury - Purple or maroon localized area of discolored intact skin or blood-filled blister due to damage of underlying soft tissue from pressure and/or shear.  Wound Description (Comments):   Present on Admission: Yes    Estimated body mass index is 24.12 kg/m as calculated from the following:   Height as of this encounter: 5\' 7"  (1.702 m).   Weight as of this encounter: 69.9 kg.   DVT prophylaxis: lovenxo Family Communication:none Status is: Observation  The patient will require care spanning > 2 midnights and should be moved to inpatient because: Hemodynamically unstable  Dispo: The patient is from: Home              Anticipated d/c is to: SNF              Anticipated d/c date is: 2 days              Patient currently is not medically stable to d/c.        Code Status:     Code Status Orders  (From admission, onward)         Start     Ordered   01/26/20 0916  Full  code  Continuous        01/26/20 0918        Code Status History    This patient has a current code status but no historical code status.   Advance Care Planning Activity        IV Access:    Peripheral IV   Procedures and diagnostic studies:   DG Shoulder Right  Result Date: 01/26/2020 CLINICAL DATA:  Fall, RIGHT shoulder pain. EXAM: RIGHT SHOULDER - 2+ VIEW COMPARISON:  RIGHT shoulder plain film dated 06/21/2018. Chest x-ray dated 10/24/2019.  FINDINGS: Superior dislocation, and at least mild anterior subluxation, of the humeral component of the RIGHT shoulder arthroplasty hardware relative to the glenoid component. Osseous irregularity along the inferior margin of the glenoid labrum, most likely chronic spurring or chronic soft tissue calcification related to the previous surgery. No convincing evidence of acute osseous fracture. IMPRESSION: 1. Superior-anterior dislocation of the humeral component of the RIGHT shoulder arthroplasty hardware relative to the glenoid component. 2. No convincing evidence of acute osseous fracture. Chronic appearing osseous spurring and/or chronic soft tissue calcifications underlying the glenohumeral joint space. Electronically Signed   By: Franki Cabot M.D.   On: 01/26/2020 05:11   DG Wrist Complete Right  Result Date: 01/26/2020 CLINICAL DATA:  Fall EXAM: RIGHT WRIST - COMPLETE 3+ VIEW COMPARISON:  None. FINDINGS: There is no evidence of fracture or dislocation. There is diffuse osteopenia. Degenerative changes seen in the radiocarpal row. There is no evidence of arthropathy or other focal bone abnormality. Scattered vascular calcifications are noted. IMPRESSION: No acute osseous abnormality. Electronically Signed   By: Prudencio Pair M.D.   On: 01/26/2020 03:15   CT Head Wo Contrast  Result Date: 01/26/2020 CLINICAL DATA:  Fall EXAM: CT HEAD WITHOUT CONTRAST CT MAXILLOFACIAL WITHOUT CONTRAST CT CERVICAL SPINE WITHOUT CONTRAST TECHNIQUE: Multidetector CT imaging of the head, cervical spine, and maxillofacial structures were performed using the standard protocol without intravenous contrast. Multiplanar CT image reconstructions of the cervical spine and maxillofacial structures were also generated. COMPARISON:  None. FINDINGS: CT HEAD FINDINGS Brain: There is no mass, hemorrhage or extra-axial collection. The size and configuration of the ventricles and extra-axial CSF spaces are normal. There is hypoattenuation  of the periventricular white matter, most commonly indicating chronic ischemic microangiopathy. Vascular: Atherosclerotic calcification of the vertebral and internal carotid arteries at the skull base. No abnormal hyperdensity of the major intracranial arteries or dural venous sinuses. Skull: Right frontal scalp hematoma.  No skull fracture. CT MAXILLOFACIAL FINDINGS Osseous: --Complex facial fracture types: No LeFort, zygomaticomaxillary complex or nasoorbitoethmoidal fracture. --Simple fracture types: None. --Mandible: No fracture or dislocation. Orbits: The globes are intact. Normal appearance of the intra- and extraconal fat. Symmetric extraocular muscles and optic nerves. Sinuses: Right maxillary retention cysts. Soft tissues: Normal visualized extracranial soft tissues. CT CERVICAL SPINE FINDINGS Alignment: Grade 1 anterolisthesis at C4-5 Skull base and vertebrae: No acute fracture. Soft tissues and spinal canal: No prevertebral fluid or swelling. No visible canal hematoma. 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 Facet arthrosis is worst at left C4-5 as well. Upper chest: No pneumothorax, pulmonary nodule or pleural effusion. Other: Normal visualized paraspinal cervical soft tissues. IMPRESSION: 1. Chronic ischemic microangiopathy without acute intracranial abnormality. 2. Right frontal scalp hematoma without skull fracture or facial fracture. 3. No acute fracture or static subluxation of the cervical spine. 4. 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 Electronically Signed   By: Ulyses Jarred M.D.   On: 01/26/2020 03:38  CT Chest W Contrast  Result Date: 01/26/2020 CLINICAL DATA:  Chest trauma, moderate to severe.  Fall in bathroom EXAM: CT CHEST, ABDOMEN, AND PELVIS WITH CONTRAST TECHNIQUE: Multidetector CT imaging of the chest, abdomen and pelvis was performed following the standard protocol during bolus administration of intravenous contrast. CONTRAST:  179mL OMNIPAQUE IOHEXOL 300 MG/ML  SOLN  COMPARISON:  Chest CTA 10/25/2019 FINDINGS: CT CHEST FINDINGS Cardiovascular: Normal heart size. No pericardial effusion. Coronary atherosclerosis. No evidence of great vessel injury Mediastinum/Nodes: No pneumomediastinum or hematoma Lungs/Pleura: Streaky opacity from atelectasis. Given similar appearing reticulation on prior study there may also be interstitial lung disease. No honeycombing. No pleural effusion. Musculoskeletal: Right glenohumeral arthroplasty with joint effusion. Diffuse degenerative thoracic endplate spurring. Remote lower and posterior right rib fractures. No acute fracture or erosion. CT ABDOMEN PELVIS FINDINGS Hepatobiliary: Marked hepatic steatosis. Pancreas: Solid and cystic mass measuring 3.4 cm at the tail, projecting superiorly. Mild adjacent fat stranding and no distinguishable plane between the mass in the stomach. Closely neighboring splenic artery without signs of pseudoaneurysm. Spleen: No splenic injury or perisplenic hematoma. Adrenals/Urinary Tract: No adrenal hemorrhage or renal injury identified. Bladder is unremarkable. 2.5 cm right renal cyst with simple CT appearance Stomach/Bowel: No evidence of injury. Indistinguishable greater curvature stomach and splenic tail mass, as above. Vascular/Lymphatic: Diffuse atheromatous calcification. Reproductive: Negative Other: No ascites or pneumoperitoneum Musculoskeletal: Soft tissue contusion posterior and lateral to the left hip. No acute fracture. Advanced lumbar spine degeneration. IMPRESSION: 1. Probable contusion posterior to the left hip. No acute fracture or other specific traumatic finding. 2. 3.4 cm solid and cystic pancreatic tail mass. There is some regional fat stranding and indistinguishable margins with the stomach, possibly a pseudocyst but cystic neoplasm is not excluded. Recommend lab workup and short-term follow-up imaging. 3. Interstitial lung disease and atelectasis. 4. Marked hepatic steatosis. Electronically  Signed   By: Monte Fantasia M.D.   On: 01/26/2020 07:21   CT Cervical Spine Wo Contrast  Result Date: 01/26/2020 CLINICAL DATA:  Fall EXAM: CT HEAD WITHOUT CONTRAST CT MAXILLOFACIAL WITHOUT CONTRAST CT CERVICAL SPINE WITHOUT CONTRAST TECHNIQUE: Multidetector CT imaging of the head, cervical spine, and maxillofacial structures were performed using the standard protocol without intravenous contrast. Multiplanar CT image reconstructions of the cervical spine and maxillofacial structures were also generated. COMPARISON:  None. FINDINGS: CT HEAD FINDINGS Brain: There is no mass, hemorrhage or extra-axial collection. The size and configuration of the ventricles and extra-axial CSF spaces are normal. There is hypoattenuation of the periventricular white matter, most commonly indicating chronic ischemic microangiopathy. Vascular: Atherosclerotic calcification of the vertebral and internal carotid arteries at the skull base. No abnormal hyperdensity of the major intracranial arteries or dural venous sinuses. Skull: Right frontal scalp hematoma.  No skull fracture. CT MAXILLOFACIAL FINDINGS Osseous: --Complex facial fracture types: No LeFort, zygomaticomaxillary complex or nasoorbitoethmoidal fracture. --Simple fracture types: None. --Mandible: No fracture or dislocation. Orbits: The globes are intact. Normal appearance of the intra- and extraconal fat. Symmetric extraocular muscles and optic nerves. Sinuses: Right maxillary retention cysts. Soft tissues: Normal visualized extracranial soft tissues. CT CERVICAL SPINE FINDINGS Alignment: Grade 1 anterolisthesis at C4-5 Skull base and vertebrae: No acute fracture. Soft tissues and spinal canal: No prevertebral fluid or swelling. No visible canal hematoma. 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 Facet arthrosis is worst at left C4-5 as well. Upper chest: No pneumothorax, pulmonary nodule or pleural effusion. Other: Normal visualized paraspinal cervical soft  tissues. IMPRESSION: 1. Chronic ischemic microangiopathy without acute intracranial  abnormality. 2. Right frontal scalp hematoma without skull fracture or facial fracture. 3. No acute fracture or static subluxation of the cervical spine. 4. 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 Electronically Signed   By: Ulyses Jarred M.D.   On: 01/26/2020 03:38   CT ABDOMEN PELVIS W CONTRAST  Result Date: 01/26/2020 CLINICAL DATA:  Chest trauma, moderate to severe.  Fall in bathroom EXAM: CT CHEST, ABDOMEN, AND PELVIS WITH CONTRAST TECHNIQUE: Multidetector CT imaging of the chest, abdomen and pelvis was performed following the standard protocol during bolus administration of intravenous contrast. CONTRAST:  176mL OMNIPAQUE IOHEXOL 300 MG/ML  SOLN COMPARISON:  Chest CTA 10/25/2019 FINDINGS: CT CHEST FINDINGS Cardiovascular: Normal heart size. No pericardial effusion. Coronary atherosclerosis. No evidence of great vessel injury Mediastinum/Nodes: No pneumomediastinum or hematoma Lungs/Pleura: Streaky opacity from atelectasis. Given similar appearing reticulation on prior study there may also be interstitial lung disease. No honeycombing. No pleural effusion. Musculoskeletal: Right glenohumeral arthroplasty with joint effusion. Diffuse degenerative thoracic endplate spurring. Remote lower and posterior right rib fractures. No acute fracture or erosion. CT ABDOMEN PELVIS FINDINGS Hepatobiliary: Marked hepatic steatosis. Pancreas: Solid and cystic mass measuring 3.4 cm at the tail, projecting superiorly. Mild adjacent fat stranding and no distinguishable plane between the mass in the stomach. Closely neighboring splenic artery without signs of pseudoaneurysm. Spleen: No splenic injury or perisplenic hematoma. Adrenals/Urinary Tract: No adrenal hemorrhage or renal injury identified. Bladder is unremarkable. 2.5 cm right renal cyst with simple CT appearance Stomach/Bowel: No evidence of injury. Indistinguishable greater curvature  stomach and splenic tail mass, as above. Vascular/Lymphatic: Diffuse atheromatous calcification. Reproductive: Negative Other: No ascites or pneumoperitoneum Musculoskeletal: Soft tissue contusion posterior and lateral to the left hip. No acute fracture. Advanced lumbar spine degeneration. IMPRESSION: 1. Probable contusion posterior to the left hip. No acute fracture or other specific traumatic finding. 2. 3.4 cm solid and cystic pancreatic tail mass. There is some regional fat stranding and indistinguishable margins with the stomach, possibly a pseudocyst but cystic neoplasm is not excluded. Recommend lab workup and short-term follow-up imaging. 3. Interstitial lung disease and atelectasis. 4. Marked hepatic steatosis. Electronically Signed   By: Monte Fantasia M.D.   On: 01/26/2020 07:21   DG Chest Port 1 View  Result Date: 01/26/2020 CLINICAL DATA:  Fall.  Level 2 trauma. EXAM: PORTABLE CHEST 1 VIEW COMPARISON:  12/14/2019 FINDINGS: Shallow inspiration. Linear atelectasis in the lung bases. No airspace disease or consolidation. Probable emphysematous changes in the lungs. Peribronchial thickening and interstitial changes likely representing chronic bronchitis. No pleural effusions. No pneumothorax. Mediastinal contours appear intact. Normal heart size. Postoperative changes in the right shoulder. Degenerative changes in the spine. IMPRESSION: Shallow inspiration with linear atelectasis in the lung bases. Emphysematous changes and fibrosis in the lungs, likely chronic bronchitis. Electronically Signed   By: Lucienne Capers M.D.   On: 01/26/2020 02:35   DG Shoulder Right Port  Result Date: 01/26/2020 CLINICAL DATA:  Post reduction EXAM: PORTABLE RIGHT SHOULDER COMPARISON:  Plain film of the RIGHT shoulder from earlier same day. FINDINGS: RIGHT shoulder arthroplasty hardware now appears appropriately aligned. No evidence of acute osseous fracture or dislocation. IMPRESSION: Successful reduction of the  RIGHT shoulder arthroplasty. Electronically Signed   By: Franki Cabot M.D.   On: 01/26/2020 06:36   CT Maxillofacial Wo Contrast  Result Date: 01/26/2020 CLINICAL DATA:  Fall EXAM: CT HEAD WITHOUT CONTRAST CT MAXILLOFACIAL WITHOUT CONTRAST CT CERVICAL SPINE WITHOUT CONTRAST TECHNIQUE: Multidetector CT imaging of the head, cervical spine, and maxillofacial structures  were performed using the standard protocol without intravenous contrast. Multiplanar CT image reconstructions of the cervical spine and maxillofacial structures were also generated. COMPARISON:  None. FINDINGS: CT HEAD FINDINGS Brain: There is no mass, hemorrhage or extra-axial collection. The size and configuration of the ventricles and extra-axial CSF spaces are normal. There is hypoattenuation of the periventricular white matter, most commonly indicating chronic ischemic microangiopathy. Vascular: Atherosclerotic calcification of the vertebral and internal carotid arteries at the skull base. No abnormal hyperdensity of the major intracranial arteries or dural venous sinuses. Skull: Right frontal scalp hematoma.  No skull fracture. CT MAXILLOFACIAL FINDINGS Osseous: --Complex facial fracture types: No LeFort, zygomaticomaxillary complex or nasoorbitoethmoidal fracture. --Simple fracture types: None. --Mandible: No fracture or dislocation. Orbits: The globes are intact. Normal appearance of the intra- and extraconal fat. Symmetric extraocular muscles and optic nerves. Sinuses: Right maxillary retention cysts. Soft tissues: Normal visualized extracranial soft tissues. CT CERVICAL SPINE FINDINGS Alignment: Grade 1 anterolisthesis at C4-5 Skull base and vertebrae: No acute fracture. Soft tissues and spinal canal: No prevertebral fluid or swelling. No visible canal hematoma. 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 Facet arthrosis is worst at left C4-5 as well. Upper chest: No pneumothorax, pulmonary nodule or pleural effusion. Other: Normal  visualized paraspinal cervical soft tissues. IMPRESSION: 1. Chronic ischemic microangiopathy without acute intracranial abnormality. 2. Right frontal scalp hematoma without skull fracture or facial fracture. 3. No acute fracture or static subluxation of the cervical spine. 4. 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 Electronically Signed   By: Ulyses Jarred M.D.   On: 01/26/2020 03:38     Medical Consultants:    None.  Anti-Infectives:   None  Subjective:    Fernando HAMAD still inebriated, mumbling but he is able to tell me that he drinks about 6 shots of tea does vodka every day.  Objective:    Vitals:   01/26/20 1905 01/27/20 0023 01/27/20 0200 01/27/20 0427  BP: 121/77 101/74  112/61  Pulse: (!) 116 (!) 126 (!) 138 (!) 123  Resp:  19  17  Temp:  98.4 F (36.9 C)  99.1 F (37.3 C)  TempSrc:  Oral  Oral  SpO2:  97%  92%  Weight:  71.7 kg  69.9 kg  Height:       SpO2: 92 % O2 Flow Rate (L/min): 2 L/min   Intake/Output Summary (Last 24 hours) at 01/27/2020 0755 Last data filed at 01/27/2020 0430 Gross per 24 hour  Intake --  Output 1800 ml  Net -1800 ml   Filed Weights   01/26/20 0220 01/27/20 0023 01/27/20 0427  Weight: 76.2 kg 71.7 kg 69.9 kg    Exam: General exam: In no acute distress. Respiratory system: Good air movement and clear to auscultation. Cardiovascular system: S1 & S2 heard, RRR. No JVD. Gastrointestinal system: Abdomen is nondistended, soft and nontender.  Extremities: No pedal edema. Skin: No rashes, lesions or ulcers Psychiatry: Judgment and insight appear poor.   Data Reviewed:    Labs: Basic Metabolic Panel: Recent Labs  Lab 01/26/20 0223 01/26/20 0223 01/26/20 0227 01/27/20 0442  NA 131*  --  131* 131*  K 4.2   < > 3.9 2.8*  CL 95*  --  93* 93*  CO2 20*  --   --  23  GLUCOSE 134*  --  128* 107*  BUN 16  --  15 9  CREATININE 1.32*  --  1.40* 0.94  CALCIUM 8.1*  --   --  8.3*   < > =  values in this interval not displayed.    GFR Estimated Creatinine Clearance: 72.3 mL/min (by C-G formula based on SCr of 0.94 mg/dL). Liver Function Tests: Recent Labs  Lab 01/26/20 0223  AST 149*  ALT 75*  ALKPHOS 104  BILITOT 1.8*  PROT 5.6*  ALBUMIN 2.9*   No results for input(s): LIPASE, AMYLASE in the last 168 hours. No results for input(s): AMMONIA in the last 168 hours. Coagulation profile Recent Labs  Lab 01/26/20 0223  INR 1.1   COVID-19 Labs  No results for input(s): DDIMER, FERRITIN, LDH, CRP in the last 72 hours.  Lab Results  Component Value Date   [place] NEGATIVE 01/26/2020    CBC: Recent Labs  Lab 01/26/20 0223 01/26/20 0227 01/26/20 0506 01/26/20 1122 01/27/20 0442  WBC 6.9  --   --  6.6 4.1  HGB 10.1* 10.2* 9.2* 9.1* 8.3*  HCT 29.3* 30.0* 27.0* 27.3* 23.8*  MCV 107.3*  --   --  108.3* 106.7*  PLT 69*  --   --  52* 39*   Cardiac Enzymes: No results for input(s): CKTOTAL, CKMB, CKMBINDEX, TROPONINI in the last 168 hours. BNP (last 3 results) No results for input(s): PROBNP in the last 8760 hours. CBG: No results for input(s): GLUCAP in the last 168 hours. D-Dimer: No results for input(s): DDIMER in the last 72 hours. Hgb A1c: No results for input(s): HGBA1C in the last 72 hours. Lipid Profile: No results for input(s): CHOL, HDL, LDLCALC, TRIG, CHOLHDL, LDLDIRECT in the last 72 hours. Thyroid function studies: Recent Labs    01/26/20 1122  TSH 4.012   Anemia work up: No results for input(s): VITAMINB12, FOLATE, FERRITIN, TIBC, IRON, RETICCTPCT in the last 72 hours. Sepsis Labs: Recent Labs  Lab 01/26/20 0223 01/26/20 0514 01/26/20 1122 01/27/20 0442  WBC 6.9  --  6.6 4.1  LATICACIDVEN 4.6* 2.1*  --   --    Microbiology Recent Results (from the past 240 hour(s))  Respiratory Panel by RT PCR (Flu A&B, Covid) - Nasopharyngeal Swab     Status: None   Collection Time: 01/26/20  2:27 AM   Specimen: Nasopharyngeal Swab  Result Value Ref Range Status   SARS  Coronavirus 2 by RT PCR NEGATIVE NEGATIVE Final    Comment: (NOTE) SARS-CoV-2 target nucleic acids are NOT DETECTED.  The SARS-CoV-2 RNA is generally detectable in upper respiratoy specimens during the acute phase of infection. The lowest concentration of SARS-CoV-2 viral copies this assay can detect is 131 copies/mL. A negative result does not preclude SARS-Cov-2 infection and should not be used as the sole basis for treatment or other patient management decisions. A negative result may occur with  improper specimen collection/handling, submission of specimen other than nasopharyngeal swab, presence of viral mutation(s) within the areas targeted by this assay, and inadequate number of viral copies (<131 copies/mL). A negative result must be combined with clinical observations, patient history, and epidemiological information. The expected result is Negative.  Fact Sheet for Patients:  PinkCheek.be  Fact Sheet for Healthcare Providers:  GravelBags.it  This test is no t yet approved or cleared by the [place] FDA and  has been authorized for detection and/or diagnosis of SARS-CoV-2 by FDA under an Emergency Use Authorization (EUA). This EUA will remain  in effect (meaning this test can be used) for the duration of the COVID-19 declaration under Section 564(b)(1) of the Act, 21 U.S.C. section 360bbb-3(b)(1), unless the authorization is terminated or revoked sooner.     Influenza  A by PCR NEGATIVE NEGATIVE Final   Influenza B by PCR NEGATIVE NEGATIVE Final    Comment: (NOTE) The Xpert Xpress SARS-CoV-2/FLU/RSV assay is intended as an aid in  the diagnosis of influenza from Nasopharyngeal swab specimens and  should not be used as a sole basis for treatment. Nasal washings and  aspirates are unacceptable for Xpert Xpress SARS-CoV-2/FLU/RSV  testing.  Fact Sheet for  Patients: PinkCheek.be  Fact Sheet for Healthcare Providers: GravelBags.it  This test is not yet approved or cleared by the [place] FDA and  has been authorized for detection and/or diagnosis of SARS-CoV-2 by  FDA under an Emergency Use Authorization (EUA). This EUA will remain  in effect (meaning this test can be used) for the duration of the  Covid-19 declaration under Section 564(b)(1) of the Act, 21  U.S.C. section 360bbb-3(b)(1), unless the authorization is  terminated or revoked. Performed at [facility], [place] [address] [place], [place] [address]      Medications:   . carvedilol  3.125 mg Oral BID WC  . diazepam  5 mg Oral Q6H  . docusate sodium  100 mg Oral BID  . enoxaparin (LOVENOX) injection  40 mg Subcutaneous Q24H  . folic acid  1 mg Oral Daily  . LORazepam  0-4 mg Intravenous Q4H   Followed by  . [START ON 01/28/2020] LORazepam  0-4 mg Intravenous Q8H  . multivitamin with minerals  1 tablet Oral Daily  . nicotine  14 mg Transdermal Daily  . sodium chloride flush  3 mL Intravenous Q12H  . thiamine  100 mg Oral Daily   Or  . thiamine  100 mg Intravenous Daily   Continuous Infusions: . lactated ringers 75 mL/hr at 01/26/20 2350      LOS: 0 days   Charlynne Cousins  [facility]  01/27/2020, 7:55 AM

## 2020-01-28 DIAGNOSIS — I5022 Chronic systolic (congestive) heart failure: Secondary | ICD-10-CM | POA: Diagnosis not present

## 2020-01-28 DIAGNOSIS — W19XXXA Unspecified fall, initial encounter: Secondary | ICD-10-CM | POA: Diagnosis not present

## 2020-01-28 DIAGNOSIS — I10 Essential (primary) hypertension: Secondary | ICD-10-CM | POA: Diagnosis not present

## 2020-01-28 DIAGNOSIS — N179 Acute kidney failure, unspecified: Secondary | ICD-10-CM | POA: Diagnosis not present

## 2020-01-28 LAB — RENAL FUNCTION PANEL
Albumin: 2.5 g/dL — ABNORMAL LOW (ref 3.5–5.0)
Anion gap: 12 (ref 5–15)
BUN: 6 mg/dL — ABNORMAL LOW (ref 8–23)
CO2: 23 mmol/L (ref 22–32)
Calcium: 7.9 mg/dL — ABNORMAL LOW (ref 8.9–10.3)
Chloride: 99 mmol/L (ref 98–111)
Creatinine, Ser: 0.88 mg/dL (ref 0.61–1.24)
GFR, Estimated: >60 mL/min (ref >60)
Glucose, Bld: 103 mg/dL — ABNORMAL HIGH (ref 70–99)
Phosphorus: 1.3 mg/dL — ABNORMAL LOW (ref 2.5–4.6)
Potassium: 3.3 mmol/L — ABNORMAL LOW (ref 3.5–5.1)
Sodium: 134 mmol/L — ABNORMAL LOW (ref 135–145)

## 2020-01-28 LAB — CBC WITH DIFFERENTIAL/PLATELET
Abs Immature Granulocytes: 0.05 10*3/uL (ref 0.00–0.07)
Basophils Absolute: 0 10*3/uL (ref 0.0–0.1)
Basophils Relative: 0 %
Eosinophils Absolute: 0 10*3/uL (ref 0.0–0.5)
Eosinophils Relative: 0 %
HCT: 20.4 % — ABNORMAL LOW (ref 39.0–52.0)
Hemoglobin: 6.9 g/dL — CL (ref 13.0–17.0)
Immature Granulocytes: 1 %
Lymphocytes Relative: 12 %
Lymphs Abs: 0.6 10*3/uL — ABNORMAL LOW (ref 0.7–4.0)
MCH: 36.7 pg — ABNORMAL HIGH (ref 26.0–34.0)
MCHC: 33.8 g/dL (ref 30.0–36.0)
MCV: 108.5 fL — ABNORMAL HIGH (ref 80.0–100.0)
Monocytes Absolute: 0.6 10*3/uL (ref 0.1–1.0)
Monocytes Relative: 13 %
Neutro Abs: 3.6 10*3/uL (ref 1.7–7.7)
Neutrophils Relative %: 74 %
Platelets: 63 10*3/uL — ABNORMAL LOW (ref 150–400)
RBC: 1.88 MIL/uL — ABNORMAL LOW (ref 4.22–5.81)
RDW: 14 % (ref 11.5–15.5)
WBC: 4.8 10*3/uL (ref 4.0–10.5)
nRBC: 0 % (ref 0.0–0.2)

## 2020-01-28 LAB — OCCULT BLOOD X 1 CARD TO LAB, STOOL: Fecal Occult Bld: POSITIVE — AB

## 2020-01-28 LAB — MAGNESIUM: Magnesium: 2.2 mg/dL (ref 1.7–2.4)

## 2020-01-28 LAB — PREPARE RBC (CROSSMATCH)

## 2020-01-28 MED ORDER — PANTOPRAZOLE SODIUM 40 MG PO TBEC
40.0000 mg | DELAYED_RELEASE_TABLET | Freq: Two times a day (BID) | ORAL | Status: DC
  Administered 2020-01-28 – 2020-01-30 (×6): 40 mg via ORAL
  Filled 2020-01-28 (×7): qty 1

## 2020-01-28 MED ORDER — MAGNESIUM SULFATE 2 GM/50ML IV SOLN
2.0000 g | Freq: Once | INTRAVENOUS | Status: AC
  Administered 2020-01-28: 2 g via INTRAVENOUS
  Filled 2020-01-28: qty 50

## 2020-01-28 MED ORDER — FUROSEMIDE 10 MG/ML IJ SOLN
20.0000 mg | Freq: Once | INTRAMUSCULAR | Status: AC
  Administered 2020-01-28: 20 mg via INTRAVENOUS
  Filled 2020-01-28: qty 2

## 2020-01-28 MED ORDER — SODIUM CHLORIDE 0.9 % IV SOLN
1.5000 g | Freq: Four times a day (QID) | INTRAVENOUS | Status: DC
  Administered 2020-01-28 – 2020-02-04 (×28): 1.5 g via INTRAVENOUS
  Filled 2020-01-28 (×2): qty 1.5
  Filled 2020-01-28 (×6): qty 4
  Filled 2020-01-28: qty 1.5
  Filled 2020-01-28 (×2): qty 4
  Filled 2020-01-28: qty 1.5
  Filled 2020-01-28 (×2): qty 4
  Filled 2020-01-28 (×2): qty 1.5
  Filled 2020-01-28 (×2): qty 4
  Filled 2020-01-28: qty 1.5
  Filled 2020-01-28: qty 4
  Filled 2020-01-28 (×2): qty 1.5
  Filled 2020-01-28 (×4): qty 4
  Filled 2020-01-28: qty 1.5
  Filled 2020-01-28: qty 4
  Filled 2020-01-28: qty 1.5
  Filled 2020-01-28 (×2): qty 4

## 2020-01-28 MED ORDER — ENSURE ENLIVE PO LIQD
237.0000 mL | Freq: Three times a day (TID) | ORAL | Status: DC
  Administered 2020-01-28 – 2020-02-04 (×18): 237 mL via ORAL

## 2020-01-28 MED ORDER — K PHOS MONO-SOD PHOS DI & MONO 155-852-130 MG PO TABS
500.0000 mg | ORAL_TABLET | Freq: Two times a day (BID) | ORAL | Status: AC
  Administered 2020-01-28: 500 mg via ORAL
  Filled 2020-01-28: qty 2

## 2020-01-28 MED ORDER — METOPROLOL TARTRATE 5 MG/5ML IV SOLN
5.0000 mg | Freq: Four times a day (QID) | INTRAVENOUS | Status: DC | PRN
  Administered 2020-01-29 – 2020-01-31 (×2): 5 mg via INTRAVENOUS
  Filled 2020-01-28 (×3): qty 5

## 2020-01-28 MED ORDER — POTASSIUM CHLORIDE CRYS ER 20 MEQ PO TBCR
40.0000 meq | EXTENDED_RELEASE_TABLET | Freq: Two times a day (BID) | ORAL | Status: DC
  Administered 2020-01-28: 40 meq via ORAL
  Filled 2020-01-28: qty 2

## 2020-01-28 MED ORDER — SODIUM CHLORIDE 0.9 % IV BOLUS
1000.0000 mL | Freq: Once | INTRAVENOUS | Status: AC
  Administered 2020-01-28: 1000 mL via INTRAVENOUS

## 2020-01-28 MED ORDER — POTASSIUM CHLORIDE CRYS ER 20 MEQ PO TBCR
40.0000 meq | EXTENDED_RELEASE_TABLET | ORAL | Status: DC
  Filled 2020-01-28: qty 2

## 2020-01-28 MED ORDER — CHLORDIAZEPOXIDE HCL 25 MG PO CAPS
25.0000 mg | ORAL_CAPSULE | Freq: Two times a day (BID) | ORAL | Status: DC
  Administered 2020-01-28: 25 mg via ORAL
  Filled 2020-01-28: qty 1

## 2020-01-28 MED ORDER — SODIUM CHLORIDE 0.9 % IV SOLN
INTRAVENOUS | Status: DC | PRN
  Administered 2020-01-28: 250 mL via INTRAVENOUS
  Administered 2020-01-31: 1000 mL via INTRAVENOUS

## 2020-01-28 MED ORDER — METOPROLOL TARTRATE 5 MG/5ML IV SOLN: 5.0000 mg | Freq: Four times a day (QID) | INTRAVENOUS | Status: DC | PRN

## 2020-01-28 MED ORDER — MAGNESIUM OXIDE 400 (241.3 MG) MG PO TABS
400.0000 mg | ORAL_TABLET | Freq: Two times a day (BID) | ORAL | Status: AC
  Administered 2020-01-28 (×2): 400 mg via ORAL
  Filled 2020-01-28 (×2): qty 1

## 2020-01-28 MED ORDER — SODIUM CHLORIDE 0.9% IV SOLUTION: Freq: Once | INTRAVENOUS | Status: AC

## 2020-01-28 NOTE — Progress Notes (Signed)
Physical Therapy Treatment Patient Details Name: Fernando [name] MRN: [id number] DOB: 1956/01/27 Today's Date: 01/28/2020    History of Present Illness Pt is a 64 y.o. M with significant PMH of CHF with reduced EF, hypertension and chronic alcohol use. Presents after a fall. Found to have right shoulder dislocation and subsequently reduced. 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     PT Comments    Pt with decreased arousal during session, pt fell asleep sitting EOB; pt max-total assist due to poor coordination, balance and strength; pt requiring mod A to maintain sitting balance following transition to sitting and while attempting to perform LE exercises; pt demonstrating deficits in balance, strength, coordination, endurance and safety and will benefit from skilled PT to address deficits to maximize independence with functional mobility prior to discharge.     Follow Up Recommendations  SNF     Equipment Recommendations  None recommended by PT    Recommendations for Other Services       Precautions / Restrictions Precautions Precautions: Fall Precaution Comments: watch HR Required Braces or Orthoses: Sling Restrictions Weight Bearing Restrictions: Yes RUE Weight Bearing: Non weight bearing Other Position/Activity Restrictions: pt unable to maintain NWB or follow commands, pt refused sling    Mobility  Bed Mobility Overal bed mobility: Needs Assistance Bed Mobility: Supine to Sit;Sit to Supine     Supine to sit: Max assist;Total assist Sit to supine: Max assist   General bed mobility comments: pt unable to maintain awake during session; pt fell asleep sitting on the EOB; pt requiring max-total assist to perform supine>sit; attempted to scoot up in bed wtih pt unable to coordinate with increased posterior lean; pt requiring max A to return to bed and max a to scoot up in bed, pt attempted to participate wtih bridges but unable to coordinate  Transfers                     Ambulation/Gait                 Stairs             Wheelchair Mobility    Modified Rankin (Stroke Patients Only)       Balance Overall balance assessment: Needs assistance Sitting-balance support: Feet supported Sitting balance-Leahy Scale: Poor Sitting balance - Comments: initially pt requiring mod A to maintain sitting balance due to increased posterior lean, with increased time verbal cueing and use of LUE on bedrail pt able to maintain wtih min guard; when attempting to perform LE exercises pt requiring mod A to maintain balance                                    Cognition                                              Exercises General Exercises - Lower Extremity Long Arc Quad: AROM;Both;10 reps;Seated    General Comments        Pertinent Vitals/Pain      Home Living                      Prior Function            PT Goals (current goals can now  be found in the care plan section) Acute Rehab PT Goals Patient Stated Goal: did not state PT Goal Formulation: With patient Time For Goal Achievement: 02/09/20 Potential to Achieve Goals: Fair Progress towards PT goals: Not progressing toward goals - comment (pt with decreased particpation in session wtih decreased arousal, RN notified)    Frequency    Min 2X/week      PT Plan Frequency needs to be updated    Co-evaluation              AM-PAC PT "6 Clicks" Mobility   Outcome Measure  Help needed turning from your back to your side while in a flat bed without using bedrails?: A Lot Help needed moving from lying on your back to sitting on the side of a flat bed without using bedrails?: Total Help needed moving to and from a bed to a chair (including a wheelchair)?: Total Help needed standing up from a chair using your arms (e.g., wheelchair or bedside chair)?: Total Help needed to walk in hospital room?: Total Help needed climbing  3-5 steps with a railing? : Total 6 Click Score: 7    End of Session Equipment Utilized During Treatment: Other (comment) (pt refused sling) Activity Tolerance: Patient limited by fatigue;Patient limited by lethargy Patient left: in bed;with call bell/phone within reach;with bed alarm set Nurse Communication: Mobility status;Other (comment) PT Visit Diagnosis: Unsteadiness on feet (R26.81);Other abnormalities of gait and mobility (R26.89);History of falling (Z91.81);Difficulty in walking, not elsewhere classified (R26.2);Pain     Time: 2376-2831 PT Time Calculation (min) (ACUTE ONLY): 14 min  Charges:  $Therapeutic Activity: 8-22 mins                     Lyanne Co, DPT Acute Rehabilitation Services [phone]   [place] 01/28/2020, 12:05 PM

## 2020-01-28 NOTE — Progress Notes (Addendum)
[facility] PROGRESS NOTE    Progress Note  Fernando [name]  WCH:[id number] DOB: October 01, 1955 DOA: 01/26/2020 PCP: Fernando Solian, MD     Brief Narrative:   Fernando [name] is an 64 y.o. male past medical history significant for chronic systolic heart failure with an EF of 25% alcohol dependence with DTs during his admission on 10/23/2019 prostatic shoulder joint infection comes in after a mechanical fall off the commode the day prior to admission.  Assessment/Plan:   Acute respiratory failure with hypoxia likely due to aspiration pneumonia: In the setting of alcohol inebriation.  He was not septic on admission. With the onset of fever that started on 01/27/2020, with a CT of chest that showed bilateral lower lobe infiltrates predominantly on the right, concern about aspiration pneumonia. We will go ahead and start him on IV Unasyn. Check a CBC with differential.  Fall at home, initial encounter Patient reported losing balance (he was inebriated). Physical therapy evaluated the patient and recommended skilled nursing facility.  Essential hypertension, with recurrent hypotension in the ED: In the setting of alcohol intoxication leading to hypovolemia with acute kidney injury on presentation. KVO IV fluid blood pressure is stabilized currently on Coreg. Use metoprolol IV for heart rate greater than 100.  Acute kidney injury: Likely prerenal azotemia in the setting of alcohol use, ARB, he is currently on 2 ARB's will have to discontinue 1. Basic metabolic panel is pending this morning.  Mild hypovolemic hyponatremia: Improving with IV fluid hydration. Basic metabolic panel is pending this morning.  Hypomagnesemia: Replete IV and orally recheck tomorrow morning.  Alcohol dependence acute metabolic encephalopathy: He drinks about 6 drinks per day his alcohol levels elevated on admission. He has had previous history of DTs with admission to the ICU this year. Continue  thiamine and folate as scheduled. Decrease the dose of Librium and continue Ativan as needed as per CIWA protocol.  Hypokalemia: Replete orally recheck in the morning.  Macrocytic anemia: Hbg < 7.0 today. Check FOBT. Transfuse 2 units of PRBC, check CBC post transfusional. Lasix in between transfusion. D/c heparins, start SCD's.  Chronic thrombocytopenia: Likely bone marrow suppression from alcohol abuse he does have an elevated MCV and macrocytic anemia which is likely due to alcohol also.  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 He will need to see Dr. Griffin [name] Tuesday morning at his office as an outpatient.  Tobacco dependence: Cessation was encouraged.  Unstageable pressure ulcer present on admission RN Pressure Injury Documentation: Pressure Injury 01/26/20 Buttocks Right Deep Tissue Pressure Injury - Purple or maroon localized area of discolored intact skin or blood-filled blister due to damage of underlying soft tissue from pressure and/or shear. (Active)  01/26/20 2000  Location: Buttocks  Location Orientation: Right  Staging: Deep Tissue Pressure Injury - Purple or maroon localized area of discolored intact skin or blood-filled blister due to damage of underlying soft tissue from pressure and/or shear.  Wound Description (Comments):   Present on Admission: Yes    Estimated body mass index is 26 kg/m as calculated from the following:   Height as of this encounter: 5\' 7"  (1.702 m).   Weight as of this encounter: 75.3 kg.   DVT prophylaxis: lovenxo Family Communication:none Status is: Observation  The patient will require care spanning > 2 midnights and should be moved to inpatient because: Hemodynamically unstable  Dispo: The patient is from: Home              Anticipated d/c  is to: SNF              Anticipated d/c date is: 2 days              Patient currently is not medically stable to d/c.  Code Status:     Code  Status Orders  (From admission, onward)         Start     Ordered   01/26/20 0916  Full code  Continuous        01/26/20 0918        Code Status History    This patient has a current code status but no historical code status.   Advance Care Planning Activity        IV Access:    Peripheral IV   Procedures and diagnostic studies:   No results found.   Medical Consultants:    None.  Anti-Infectives:   None  Subjective:    Fernando [name] patient is sleepy this morning, he relates he has a good appetite relates he continues to have a cough.  Objective:    Vitals:   01/28/20 0100 01/28/20 0128 01/28/20 0400 01/28/20 0750  BP: 107/74 120/73 (!) 108/55   Pulse:      Resp: 20 20 20    Temp:   98.7 F (37.1 C) (!) 100.4 F (38 C)  TempSrc: Oral  Oral Oral  SpO2: 92% 96% 95%   Weight:      Height:       SpO2: 95 % O2 Flow Rate (L/min): 3 L/min   Intake/Output Summary (Last 24 hours) at 01/28/2020 0852 Last data filed at 01/28/2020 0428 Gross per 24 hour  Intake 2416.83 ml  Output 1750 ml  Net 666.83 ml   Filed Weights   01/27/20 0023 01/27/20 0427 01/28/20 0025  Weight: 71.7 kg 69.9 kg 75.3 kg    Exam: General exam: In no acute distress. Respiratory system: Good air movement and clear to auscultation. Cardiovascular system: S1 & S2 heard, RRR. No JVD. Gastrointestinal system: Abdomen is nondistended, soft and nontender.  Extremities: No pedal edema. Skin: No rashes, lesions or ulcers  Data Reviewed:    Labs: Basic Metabolic Panel: Recent Labs  Lab 01/26/20 0223 01/26/20 0223 01/26/20 0227 01/27/20 0441 01/27/20 0442  NA 131*  --  131*  --  131*  K 4.2   < > 3.9  --  2.8*  CL 95*  --  93*  --  93*  CO2 20*  --   --   --  23  GLUCOSE 134*  --  128*  --  107*  BUN 16  --  15  --  9  CREATININE 1.32*  --  1.40*  --  0.94  CALCIUM 8.1*  --   --   --  8.3*  MG  --   --   --  0.9*  --    < > = values in this interval not  displayed.   GFR Estimated Creatinine Clearance: 72.3 mL/min (by C-G formula based on SCr of 0.94 mg/dL). Liver Function Tests: Recent Labs  Lab 01/26/20 0223  AST 149*  ALT 75*  ALKPHOS 104  BILITOT 1.8*  PROT 5.6*  ALBUMIN 2.9*   No results for input(s): LIPASE, AMYLASE in the last 168 hours. No results for input(s): AMMONIA in the last 168 hours. Coagulation profile Recent Labs  Lab 01/26/20 0223  INR 1.1   COVID-19 Labs  No results for input(s): DDIMER,  FERRITIN, LDH, CRP in the last 72 hours.  Lab Results  Component Value Date   [place] NEGATIVE 01/26/2020    CBC: Recent Labs  Lab 01/26/20 0223 01/26/20 0227 01/26/20 0506 01/26/20 1122 01/27/20 0442  WBC 6.9  --   --  6.6 4.1  HGB 10.1* 10.2* 9.2* 9.1* 8.3*  HCT 29.3* 30.0* 27.0* 27.3* 23.8*  MCV 107.3*  --   --  108.3* 106.7*  PLT 69*  --   --  52* 39*   Cardiac Enzymes: No results for input(s): CKTOTAL, CKMB, CKMBINDEX, TROPONINI in the last 168 hours. BNP (last 3 results) No results for input(s): PROBNP in the last 8760 hours. CBG: No results for input(s): GLUCAP in the last 168 hours. D-Dimer: No results for input(s): DDIMER in the last 72 hours. Hgb A1c: No results for input(s): HGBA1C in the last 72 hours. Lipid Profile: No results for input(s): CHOL, HDL, LDLCALC, TRIG, CHOLHDL, LDLDIRECT in the last 72 hours. Thyroid function studies: Recent Labs    01/26/20 1122  TSH 4.012   Anemia work up: No results for input(s): VITAMINB12, FOLATE, FERRITIN, TIBC, IRON, RETICCTPCT in the last 72 hours. Sepsis Labs: Recent Labs  Lab 01/26/20 0223 01/26/20 0514 01/26/20 1122 01/27/20 0442  WBC 6.9  --  6.6 4.1  LATICACIDVEN 4.6* 2.1*  --   --    Microbiology Recent Results (from the past 240 hour(s))  Respiratory Panel by RT PCR (Flu A&B, Covid) - Nasopharyngeal Swab     Status: None   Collection Time: 01/26/20  2:27 AM   Specimen: Nasopharyngeal Swab  Result Value Ref Range Status    SARS Coronavirus 2 by RT PCR NEGATIVE NEGATIVE Final    Comment: (NOTE) SARS-CoV-2 target nucleic acids are NOT DETECTED.  The SARS-CoV-2 RNA is generally detectable in upper respiratoy specimens during the acute phase of infection. The lowest concentration of SARS-CoV-2 viral copies this assay can detect is 131 copies/mL. A negative result does not preclude SARS-Cov-2 infection and should not be used as the sole basis for treatment or other patient management decisions. A negative result may occur with  improper specimen collection/handling, submission of specimen other than nasopharyngeal swab, presence of viral mutation(s) within the areas targeted by this assay, and inadequate number of viral copies (<131 copies/mL). A negative result must be combined with clinical observations, patient history, and epidemiological information. The expected result is Negative.  Fact Sheet for Patients:  PinkCheek.be  Fact Sheet for Healthcare Providers:  GravelBags.it  This test is no t yet approved or cleared by the [place] FDA and  has been authorized for detection and/or diagnosis of SARS-CoV-2 by FDA under an Emergency Use Authorization (EUA). This EUA will remain  in effect (meaning this test can be used) for the duration of the COVID-19 declaration under Section 564(b)(1) of the Act, 21 U.S.C. section 360bbb-3(b)(1), unless the authorization is terminated or revoked sooner.     Influenza A by PCR NEGATIVE NEGATIVE Final   Influenza B by PCR NEGATIVE NEGATIVE Final    Comment: (NOTE) The Xpert Xpress SARS-CoV-2/FLU/RSV assay is intended as an aid in  the diagnosis of influenza from Nasopharyngeal swab specimens and  should not be used as a sole basis for treatment. Nasal washings and  aspirates are unacceptable for Xpert Xpress SARS-CoV-2/FLU/RSV  testing.  Fact Sheet for  Patients: PinkCheek.be  Fact Sheet for Healthcare Providers: GravelBags.it  This test is not yet approved or cleared by the [place] and  has been authorized for detection and/or diagnosis of SARS-CoV-2 by  FDA under an Emergency Use Authorization (EUA). This EUA will remain  in effect (meaning this test can be used) for the duration of the  Covid-19 declaration under Section 564(b)(1) of the Act, 21  U.S.C. section 360bbb-3(b)(1), unless the authorization is  terminated or revoked. Performed at [facility], [place] [address] [place], [place] [address]      Medications:   . carvedilol  3.125 mg Oral BID WC  . chlordiazePOXIDE  50 mg Oral BID  . docusate sodium  100 mg Oral BID  . folic acid  1 mg Oral Daily  . LORazepam  0-4 mg Intravenous Q4H   Followed by  . LORazepam  0-4 mg Intravenous Q8H  . multivitamin with minerals  1 tablet Oral Daily  . nicotine  14 mg Transdermal Daily  . potassium chloride  40 mEq Oral Q4H  . sodium chloride flush  3 mL Intravenous Q12H  . thiamine  100 mg Oral Daily   Or  . thiamine  100 mg Intravenous Daily   Continuous Infusions: . sodium chloride Stopped (01/28/20 0420)  . magnesium sulfate bolus IVPB        LOS: 1 day   Charlynne Cousins  [facility]  01/28/2020, 8:52 AM

## 2020-01-28 NOTE — Progress Notes (Signed)
Attempted to call significant other for blood transfusion consent as patient is not alert and oriented enough to sign; no answer. Left call back number. MD notified.

## 2020-01-28 NOTE — Progress Notes (Signed)
Initial Nutrition Assessment  DOCUMENTATION CODES:   Not applicable  INTERVENTION:   -Ensure Enlive po TID, each supplement provides 350 kcal and 20 grams of protein -MVI with minerals daily  NUTRITION DIAGNOSIS:   Inadequate oral intake related to decreased appetite as evidenced by meal completion < 50%.  GOAL:   Patient will meet greater than or equal to 90% of their needs  MONITOR:   PO intake, Supplement acceptance, Labs, Weight trends, Skin, I & O's  REASON FOR ASSESSMENT:   Consult Assessment of nutrition requirement/status  ASSESSMENT:   Fernando [name] is a 64 y.o. male with medical history significant of chronic systolic CHF (EF 62-69% in 10/2019);  ETOH dependence with DTs during 10/2019 admission; prosthetic shoulder joint infection; HTN; and HLD presenting after a fall.   He reports that he got up off the commode overnight, lost his balance, and hit his head with significant bleeding.  He had been drinking "a little bit" of alcohol, about as much as he usually drinks.  He reports that his R hip hurts but he is able to move it; he isn't sure he can bear weight on it.  His forehead is also sore.  Pt admitted s/p fall.   Reviewed I/O's: +907 ml x 24 hours and +1.6 L since admission  UOP: 1.8 L x 24 hours  Pt very somnolent at time of visit. He did not respond to voice or touch.   Noted poor oral intake. Meal completion 20-30%. Suspect poor nutritional intake PTA due to history of ETOH abuse.   No wt hx available to assess for weight changes.   Pt would greatly benefit from addition of oral nutrition supplements.   Medications reviewed and include colace, folic acid, ativan, thiamine, and 0.9% sodium chloride infusion @ 100 ml/hr.   Labs reviewed: Na: 131, K: 2.8 (on PO supplementation), and Mg: 0.9 (on IV supplementation).  NUTRITION - FOCUSED PHYSICAL EXAM:    Most Recent Value  Orbital Region No depletion  Upper Arm Region No depletion  Thoracic and  Lumbar Region No depletion  Buccal Region No depletion  Temple Region No depletion  Clavicle Bone Region No depletion  Clavicle and Acromion Bone Region No depletion  Scapular Bone Region No depletion  Dorsal Hand No depletion  Patellar Region No depletion  Anterior Thigh Region No depletion  Posterior Calf Region No depletion  Edema (RD Assessment) Mild  Hair Reviewed  Eyes Reviewed  Mouth Reviewed  Skin Reviewed  Nails Reviewed       Diet Order:   Diet Order            Diet Heart Room service appropriate? Yes; Fluid consistency: Thin  Diet effective now                 EDUCATION NEEDS:   No education needs have been identified at this time  Skin:  Skin Assessment: Skin Integrity Issues: Skin Integrity Issues:: Incisions, Other (Comment), DTI DTI: rt buttock Incisions: mid upper nose, rt upper face, rt lateral thigh Other: skin tear rt elbow  Last BM:  01/27/20  Height:   Ht Readings from Last 1 Encounters:  01/26/20 5\' 7"  (1.702 m)    Weight:   Wt Readings from Last 1 Encounters:  01/28/20 75.3 kg    Ideal Body Weight:  67.3 kg  BMI:  Body mass index is 26 kg/m.  Estimated Nutritional Needs:   Kcal:  2050-2250  Protein:  110-125 grams  Fluid:  >  [place], RD, LDN, [place] Registered Dietitian II Certified Diabetes Care and Education Specialist Please refer to [facility] for RD and/or RD on-call/weekend/after hours pager

## 2020-01-28 NOTE — TOC CAGE-AID Note (Signed)
Transition of Care [facility]) - CAGE-AID Screening   Patient Details  Name: Fernando [name] MRN: [id number] Date of Birth: 1955-12-28  Clinical Narrative: Patient denies alcohol use recently, states that he has not used alcohol in some time. However, the patient was positive for ETOH on admission. The patient refused resources at this time.   CAGE-AID Screening:   Have You Ever Felt You Ought to Cut Down on Your Drinking or Drug Use?: No Have People Annoyed You By Critizing Your Drinking Or Drug Use?: No Have You Felt Bad Or Guilty About Your Drinking Or Drug Use?: No Have You Ever Had a Drink or Used Drugs First Thing In The Morning to Steady Your Nerves or to Get Rid of a Hangover?: No CAGE-AID Score: 0

## 2020-01-28 NOTE — Progress Notes (Signed)
   01/28/20 0012  Vitals  Temp 99.8 F (37.7 C)  Temp Source Oral  BP (!) 63/54  MAP (mmHg) (!) 59  BP Location Left Arm  BP Method Automatic  Patient Position (if appropriate) Lying  ECG Heart Rate (!) 115  Resp (!) 25  MEWS COLOR  MEWS Score Color Red  Oxygen Therapy  SpO2 93 %  O2 Device Nasal Cannula  O2 Flow Rate (L/min) 3 L/min  MEWS Score  MEWS Temp 0  MEWS Systolic 3  MEWS Pulse 2  MEWS RR 1  MEWS LOC 0  MEWS Score 6  Dr Myna Hidalgo notified. Order to give 1000cc bolus. Orders initiated. [place] protocol initiated. Will continue to monitor patient.

## 2020-01-28 NOTE — Progress Notes (Signed)
Spoke with neighbor who is listed as patient's emergency contact (but is not his significant other). She states that patient brother Fernando [name] will need to be contacted for discharge plan, as patient is not safe to live at home alone with history of frequent falls related to ETOH abuse. Brother is aware and is awaiting phone call; he lives in [place],, [phone]

## 2020-01-28 NOTE — Progress Notes (Signed)
Patient's SBP fell into the upper 70s again today, just prior to starting his transfusion. After first unit complete, SBP back up to 122. Additionally, patient with expiratory rubs in all lobes this morning; after first unit, expiratory wheezing and wet lung sounds throughout. IV Lasix was administered as ordered; second unit to be managed by night shift. Fecal Occult Blood sample x 1 obtained today with large incontinent BM; two more ordered. Patient very pleasant but was unable to be significantly and sustainably woken after first administration of scheduled ativan. Patient will need full assist with meals as his coordination does not allow him to self feed at this time.

## 2020-01-28 NOTE — Progress Notes (Signed)
   01/28/20 0128  Vitals  BP 120/73  MAP (mmHg) 86  BP Location Right Arm  BP Method Automatic  Patient Position (if appropriate) Lying  ECG Heart Rate (!) 120  Resp 20  MEWS COLOR  MEWS Score Color Yellow  Oxygen Therapy  SpO2 96 %  O2 Device Nasal Cannula  MEWS Score  MEWS Temp 0  MEWS Systolic 0  MEWS Pulse 2  MEWS RR 0  MEWS LOC 0  MEWS Score 2  Bolus Complete

## 2020-01-28 NOTE — Progress Notes (Signed)
First unit PRBC transfusion initiated. Patient awake and alert, friend at bedside, no complaints noted. Eating lunch with assist from friend.

## 2020-01-29 ENCOUNTER — Inpatient Hospital Stay (HOSPITAL_COMMUNITY): Payer: Medicare Other

## 2020-01-29 DIAGNOSIS — R0600 Dyspnea, unspecified: Secondary | ICD-10-CM | POA: Diagnosis not present

## 2020-01-29 DIAGNOSIS — W19XXXA Unspecified fall, initial encounter: Secondary | ICD-10-CM | POA: Diagnosis not present

## 2020-01-29 DIAGNOSIS — I10 Essential (primary) hypertension: Secondary | ICD-10-CM | POA: Diagnosis not present

## 2020-01-29 DIAGNOSIS — N179 Acute kidney failure, unspecified: Secondary | ICD-10-CM | POA: Diagnosis not present

## 2020-01-29 DIAGNOSIS — I5022 Chronic systolic (congestive) heart failure: Secondary | ICD-10-CM | POA: Diagnosis not present

## 2020-01-29 LAB — TYPE AND SCREEN
ABO/RH(D): O POS
Antibody Screen: NEGATIVE
Unit division: 0
Unit division: 0

## 2020-01-29 LAB — BASIC METABOLIC PANEL
Anion gap: 9 (ref 5–15)
BUN: 8 mg/dL (ref 8–23)
CO2: 30 mmol/L (ref 22–32)
Calcium: 8.2 mg/dL — ABNORMAL LOW (ref 8.9–10.3)
Chloride: 98 mmol/L (ref 98–111)
Creatinine, Ser: 0.78 mg/dL (ref 0.61–1.24)
GFR, Estimated: >60 mL/min (ref >60)
Glucose, Bld: 110 mg/dL — ABNORMAL HIGH (ref 70–99)
Potassium: 3.6 mmol/L (ref 3.5–5.1)
Sodium: 137 mmol/L (ref 135–145)

## 2020-01-29 LAB — HEMOGLOBIN AND HEMATOCRIT, BLOOD
HCT: 31.1 % — ABNORMAL LOW (ref 39.0–52.0)
Hemoglobin: 10.5 g/dL — ABNORMAL LOW (ref 13.0–17.0)

## 2020-01-29 LAB — ECHOCARDIOGRAM COMPLETE
Area-P 1/2: 1.97 cm2
Height: 67 in
S' Lateral: 4 cm
Single Plane A4C EF: 44 %
Weight: 2704 oz

## 2020-01-29 LAB — BPAM RBC
Blood Product Expiration Date: 202111012359
Blood Product Expiration Date: 202111232359
ISSUE DATE / TIME: 202110251412
ISSUE DATE / TIME: 202110252153
Unit Type and Rh: 5100
Unit Type and Rh: 5100

## 2020-01-29 MED ORDER — SODIUM CHLORIDE 0.9 % IV SOLN: INTRAVENOUS | Status: AC

## 2020-01-29 MED ORDER — SODIUM CHLORIDE 0.9 % IV SOLN: INTRAVENOUS | Status: DC

## 2020-01-29 MED ORDER — DILTIAZEM LOAD VIA INFUSION
15.0000 mg | Freq: Once | INTRAVENOUS | Status: DC
  Filled 2020-01-29: qty 15

## 2020-01-29 MED ORDER — DILTIAZEM HCL-DEXTROSE 125-5 MG/125ML-% IV SOLN (PREMIX)
5.0000 mg/h | INTRAVENOUS | Status: DC
  Filled 2020-01-29: qty 125

## 2020-01-29 MED ORDER — METOPROLOL TARTRATE 25 MG PO TABS
25.0000 mg | ORAL_TABLET | Freq: Two times a day (BID) | ORAL | Status: DC
  Administered 2020-01-29 – 2020-01-30 (×2): 25 mg via ORAL
  Filled 2020-01-29 (×2): qty 1

## 2020-01-29 MED ORDER — POTASSIUM CHLORIDE CRYS ER 20 MEQ PO TBCR
40.0000 meq | EXTENDED_RELEASE_TABLET | Freq: Two times a day (BID) | ORAL | Status: AC
  Administered 2020-01-29 (×2): 40 meq via ORAL
  Filled 2020-01-29 (×2): qty 2

## 2020-01-29 MED ORDER — PERFLUTREN LIPID MICROSPHERE
1.0000 mL | INTRAVENOUS | Status: AC | PRN
  Administered 2020-01-29: 2 mL via INTRAVENOUS
  Filled 2020-01-29: qty 10

## 2020-01-29 MED ORDER — FUROSEMIDE 10 MG/ML IJ SOLN
20.0000 mg | Freq: Once | INTRAMUSCULAR | Status: AC
  Administered 2020-01-29: 20 mg via INTRAVENOUS
  Filled 2020-01-29: qty 2

## 2020-01-29 NOTE — Progress Notes (Signed)
  Echocardiogram 2D Echocardiogram has been performed.  Fidel Levy 01/29/2020, 2:41 PM

## 2020-01-29 NOTE — Progress Notes (Signed)
Notified Dr Cyndia Diver of patient expiratory wheezing and crackles on auscultation. Patient due to receive 1 unit of RBC. Verbal order to give 20 mg of Iv lasix once if patient sill has crackles and wheezing after transfusion,. Will continue to monitor and initiate orders when needed.

## 2020-01-29 NOTE — Progress Notes (Signed)
Patient with noted improvement today. Scheduled AM ativan dose was halved today with positive effects. Patient is now more alert and is attempting to feed himself at meals; however, coordination is still somewhat slow and ultimately he requires assistance. His speech clarity and formation is clearer today and he is able to make more of his needs known in less time. He did voice that he is getting restless and "stir-crazy" and would like to be able to walk around the room. I explained that he is not quite at that point yet, but that his improvement is promising. Patient does know why he is here: "I drank too much", knows where he is "[facility]", but is not quite oriented to the time yet "I don't know" (in regard to an approximate day of the week). He continues to have adventitious lung sounds in right lower lobe, vascillating between expiratory rubs and wheezes. Bowel sounds are active and he has had two BMs since yesterday; none yet today. Urine output is good and approriate for his intake; yellow straw colored, clear urine in foley bag. SCDs are on but patient frequently asks to have them removed; 30 minute breaks given as appropriate. Patient continues to be tachycardic but metoprolol PRN not able to be given since SBP remains in the low 80's; this seems to be the result of the coreg in the AM, which has been discontinued. If patient is sleeping and still, HR remains around 100. Patient does seem to tire easily, as noted during meal times. At those times he seems to need to increase his breathing, which becomes slightly labored, though he recovers quickly. Overall, patient with decreased withdrawal symptoms, inccreased LOC and improved judgement.

## 2020-01-29 NOTE — Progress Notes (Signed)
Removed IV to left lateral AC area as it was leaking. Catheter intact, no bleeding from site.

## 2020-01-29 NOTE — Progress Notes (Addendum)
[facility] PROGRESS NOTE    Progress Note  Fernando [name]  DTO:[id number] DOB: 03-Nov-1955 DOA: 01/26/2020 PCP: [name], MD     Brief Narrative:   Fernando [name] is an 64 y.o. male past medical history significant for chronic systolic heart failure with an EF of 25% alcohol dependence with DTs during his admission on 10/23/2019 prostatic shoulder joint infection comes in after a mechanical fall off the commode the day prior to admission.  While in the hospital he started having fevers a CT of the chest was done that showed bilateral lower lobes infiltrates concern of aspiration pneumonia, he was started on IV Unasyn.  He also developed sinus tachycardia in the setting of alcohol withdrawal and hypovolemia, now on IV fluids.  Assessment/Plan:   Acute respiratory failure with hypoxia likely due to aspiration pneumonia: In the setting of alcohol inebriation.  He was not septic on admission. He started having fevers he was started empirically on IV antibiotics he has defervesce. Continue Unasyn for now.  Culture data has remained negative till date.  Alcohol dependence acute metabolic encephalopathy: He drinks about 6 drinks per day his alcohol levels elevated on admission. He has had previous history of DTs with admission to the ICU this year. He was started on thiamine and folate, on admission he was started on Librium for 48 hours on top of the CIWA protocol. Librium has been discontinued, he is continue on his CIWA's protocol. We will have to have a low threshold as he is at high risk of delirium.  Sinus tachycardia: Discontinue Coreg start metoprolol orally, use IV metoprolol for heart rate greater than 100. 2D echo is pending. While he is sleeping his heart rate is in the 100-1 05 when he is awake and moving around it goes up to 120. Question if he is having some mild withdrawal symptoms.  Essential hypertension, with recurrent hypotension in the ED: In the setting  of alcohol intoxication leading to hypovolemia with acute kidney injury on presentation, now resolved. Cont b-blockers.   Acute kidney injury: Likely prerenal azotemia in the setting of alcohol use, ARB, he is currently on 2 ARB's will have to discontinue 1. Creatinine has return to baseline.  We will go ahead and cont.  IV fluids as his bicarb is trending up this morning is 30 he has had decreased oral intake in the setting of possible withdrawals.  Mild hypovolemic hyponatremia: Resolved with IV fluid hydration.  Hypomagnesemia: Replete and resolved.  Hypokalemia: 3.6 this morning repeat again recheck in the morning.  Try to keep greater than 4.  Fall at home, initial encounter Patient reported losing balance (he was inebriated). Physical therapy evaluated the patient and recommended skilled nursing facility.  Macrocytic anemia: Status post 1 unit of packed red blood cells hemoglobin today is 10.5, FOBT was positive. Keep him on SCDs. We will need to follow-up with GI as an outpatient.  Chronic thrombocytopenia: Likely bone marrow suppression from alcohol abuse he does have an elevated MCV and macrocytic anemia which is likely due to alcohol also. Platelet count is trending up.  Shoulder dislocation: Current anterior dislocation reduced in the ED. He will need to see Dr. Griffin Basil (orthopedic surgery) Tuesday morning at his office as an outpatient.  Tobacco dependence: Cessation was encouraged.  Unstageable pressure ulcer present on admission RN Pressure Injury Documentation: Pressure Injury 01/26/20 Buttocks Right Deep Tissue Pressure Injury - Purple or maroon localized area of discolored intact skin or blood-filled blister due to damage  of underlying soft tissue from pressure and/or shear. (Active)  01/26/20 2000  Location: Buttocks  Location Orientation: Right  Staging: Deep Tissue Pressure Injury - Purple or maroon localized area of discolored intact skin or blood-filled  blister due to damage of underlying soft tissue from pressure and/or shear.  Wound Description (Comments):   Present on Admission: Yes    Estimated body mass index is 26.47 kg/m as calculated from the following:   Height as of this encounter: 5\' 7"  (1.702 m).   Weight as of this encounter: 76.7 kg.   DVT prophylaxis: lovenxo Family Communication:none Status is: Observation  The patient will require care spanning > 2 midnights and should be moved to inpatient because: Hemodynamically unstable  Dispo: The patient is from: Home              Anticipated d/c is to: SNF              Anticipated d/c date is: 3 days              Patient currently is not medically stable to d/c.  Code Status:     Code Status Orders  (From admission, onward)         Start     Ordered   01/26/20 0916  Full code  Continuous        01/26/20 0918        Code Status History    This patient has a current code status but no historical code status.   Advance Care Planning Activity        IV Access:    Peripheral IV   Procedures and diagnostic studies:   No results found.   Medical Consultants:    None.  Anti-Infectives:   None  Subjective:    Fernando [name] more awake this morning he relates he wants to go out and smoke a cigarette.  Objective:    Vitals:   01/29/20 0200 01/29/20 0806 01/29/20 0807 01/29/20 0840  BP:  126/76 126/76 110/73  Pulse:  (!) 137 (!) 137 (!) 134  Resp:  20 20 20   Temp:  100.3 F (37.9 C) 100.3 F (37.9 C) 99.3 F (37.4 C)  TempSrc:  Oral    SpO2:  97%    Weight: 76.7 kg     Height:       SpO2: 97 % O2 Flow Rate (L/min): 3 L/min   Intake/Output Summary (Last 24 hours) at 01/29/2020 0925 Last data filed at 01/29/2020 0914 Gross per 24 hour  Intake 1655.75 ml  Output 4250 ml  Net -2594.25 ml   Filed Weights   01/27/20 0427 01/28/20 0025 01/29/20 0200  Weight: 69.9 kg 75.3 kg 76.7 kg    Exam: General exam: In no acute  distress. Respiratory system: Good air movement and crackles on the right base more pronounced on the left Cardiovascular system: S1 & S2 heard, RRR. No JVD. Gastrointestinal system: Abdomen is nondistended, soft and nontender.  Extremities: No pedal edema. Skin: No rashes, lesions or ulcers Psychiatry: Judgment and insight does not appear normal.  Data Reviewed:    Labs: Basic Metabolic Panel: Recent Labs  Lab 01/26/20 0223 01/26/20 0223 01/26/20 0227 01/26/20 0227 01/27/20 0441 01/27/20 0442 01/27/20 0442 01/28/20 1109 01/29/20 0249  NA 131*  --  131*  --   --  131*  --  134* 137  K 4.2   < > 3.9   < >  --  2.8*   < >  3.3* 3.6  CL 95*  --  93*  --   --  93*  --  99 98  CO2 20*  --   --   --   --  23  --  23 30  GLUCOSE 134*  --  128*  --   --  107*  --  103* 110*  BUN 16  --  15  --   --  9  --  6* 8  CREATININE 1.32*  --  1.40*  --   --  0.94  --  0.88 0.78  CALCIUM 8.1*  --   --   --   --  8.3*  --  7.9* 8.2*  MG  --   --   --   --  0.9*  --   --  2.2  --   PHOS  --   --   --   --   --   --   --  1.3*  --    < > = values in this interval not displayed.   GFR Estimated Creatinine Clearance: 84.9 mL/min (by C-G formula based on SCr of 0.78 mg/dL). Liver Function Tests: Recent Labs  Lab 01/26/20 0223 01/28/20 1109  AST 149*  --   ALT 75*  --   ALKPHOS 104  --   BILITOT 1.8*  --   PROT 5.6*  --   ALBUMIN 2.9* 2.5*   No results for input(s): LIPASE, AMYLASE in the last 168 hours. No results for input(s): AMMONIA in the last 168 hours. Coagulation profile Recent Labs  Lab 01/26/20 0223  INR 1.1   COVID-19 Labs  No results for input(s): DDIMER, FERRITIN, LDH, CRP in the last 72 hours.  Lab Results  Component Value Date   [place] NEGATIVE 01/26/2020    CBC: Recent Labs  Lab 01/26/20 0223 01/26/20 0227 01/26/20 0506 01/26/20 1122 01/27/20 0442 01/28/20 1109 01/29/20 0756  WBC 6.9  --   --  6.6 4.1 4.8  --   NEUTROABS  --   --   --   --   --   3.6  --   HGB 10.1*   < > 9.2* 9.1* 8.3* 6.9* 10.5*  HCT 29.3*   < > 27.0* 27.3* 23.8* 20.4* 31.1*  MCV 107.3*  --   --  108.3* 106.7* 108.5*  --   PLT 69*  --   --  52* 39* 63*  --    < > = values in this interval not displayed.   Cardiac Enzymes: No results for input(s): CKTOTAL, CKMB, CKMBINDEX, TROPONINI in the last 168 hours. BNP (last 3 results) No results for input(s): PROBNP in the last 8760 hours. CBG: No results for input(s): GLUCAP in the last 168 hours. D-Dimer: No results for input(s): DDIMER in the last 72 hours. Hgb A1c: No results for input(s): HGBA1C in the last 72 hours. Lipid Profile: No results for input(s): CHOL, HDL, LDLCALC, TRIG, CHOLHDL, LDLDIRECT in the last 72 hours. Thyroid function studies: Recent Labs    01/26/20 1122  TSH 4.012   Anemia work up: No results for input(s): VITAMINB12, FOLATE, FERRITIN, TIBC, IRON, RETICCTPCT in the last 72 hours. Sepsis Labs: Recent Labs  Lab 01/26/20 0223 01/26/20 0514 01/26/20 1122 01/27/20 0442 01/28/20 1109  WBC 6.9  --  6.6 4.1 4.8  LATICACIDVEN 4.6* 2.1*  --   --   --    Microbiology Recent Results (from the past 240 hour(s))  Respiratory Panel by  RT PCR (Flu A&B, Covid) - Nasopharyngeal Swab     Status: None   Collection Time: 01/26/20  2:27 AM   Specimen: Nasopharyngeal Swab  Result Value Ref Range Status   SARS Coronavirus 2 by RT PCR NEGATIVE NEGATIVE Final    Comment: (NOTE) SARS-CoV-2 target nucleic acids are NOT DETECTED.  The SARS-CoV-2 RNA is generally detectable in upper respiratoy specimens during the acute phase of infection. The lowest concentration of SARS-CoV-2 viral copies this assay can detect is 131 copies/mL. A negative result does not preclude SARS-Cov-2 infection and should not be used as the sole basis for treatment or other patient management decisions. A negative result may occur with  improper specimen collection/handling, submission of specimen other than  nasopharyngeal swab, presence of viral mutation(s) within the areas targeted by this assay, and inadequate number of viral copies (<131 copies/mL). A negative result must be combined with clinical observations, patient history, and epidemiological information. The expected result is Negative.  Fact Sheet for Patients:  PinkCheek.be  Fact Sheet for Healthcare Providers:  GravelBags.it  This test is no t yet approved or cleared by the [place] FDA and  has been authorized for detection and/or diagnosis of SARS-CoV-2 by FDA under an Emergency Use Authorization (EUA). This EUA will remain  in effect (meaning this test can be used) for the duration of the COVID-19 declaration under Section 564(b)(1) of the Act, 21 U.S.C. section 360bbb-3(b)(1), unless the authorization is terminated or revoked sooner.     Influenza A by PCR NEGATIVE NEGATIVE Final   Influenza B by PCR NEGATIVE NEGATIVE Final    Comment: (NOTE) The Xpert Xpress SARS-CoV-2/FLU/RSV assay is intended as an aid in  the diagnosis of influenza from Nasopharyngeal swab specimens and  should not be used as a sole basis for treatment. Nasal washings and  aspirates are unacceptable for Xpert Xpress SARS-CoV-2/FLU/RSV  testing.  Fact Sheet for Patients: PinkCheek.be  Fact Sheet for Healthcare Providers: GravelBags.it  This test is not yet approved or cleared by the [place] FDA and  has been authorized for detection and/or diagnosis of SARS-CoV-2 by  FDA under an Emergency Use Authorization (EUA). This EUA will remain  in effect (meaning this test can be used) for the duration of the  Covid-19 declaration under Section 564(b)(1) of the Act, 21  U.S.C. section 360bbb-3(b)(1), unless the authorization is  terminated or revoked. Performed at [facility], [place] [address], [place],  [place] [address]      Medications:   . carvedilol  3.125 mg Oral BID WC  . docusate sodium  100 mg Oral BID  . feeding supplement  237 mL Oral TID BM  . folic acid  1 mg Oral Daily  . LORazepam  0-4 mg Intravenous Q8H  . multivitamin with minerals  1 tablet Oral Daily  . nicotine  14 mg Transdermal Daily  . pantoprazole  40 mg Oral BID  . phosphorus  500 mg Oral BID  . sodium chloride flush  3 mL Intravenous Q12H  . thiamine  100 mg Oral Daily   Or  . thiamine  100 mg Intravenous Daily   Continuous Infusions: . sodium chloride Stopped (01/28/20 2220)  . ampicillin-sulbactam (UNASYN) IV 1.5 g (01/29/20 0918)      LOS: 2 days   Charlynne Cousins  [facility]  01/29/2020, 9:25 AM

## 2020-01-30 ENCOUNTER — Ambulatory Visit: Payer: 59 | Admitting: Pulmonary Disease

## 2020-01-30 ENCOUNTER — Encounter: Payer: Self-pay | Admitting: Internal Medicine

## 2020-01-30 ENCOUNTER — Encounter (HOSPITAL_COMMUNITY): Payer: Self-pay | Admitting: Internal Medicine

## 2020-01-30 DIAGNOSIS — W19XXXA Unspecified fall, initial encounter: Secondary | ICD-10-CM | POA: Diagnosis not present

## 2020-01-30 DIAGNOSIS — K921 Melena: Secondary | ICD-10-CM | POA: Diagnosis not present

## 2020-01-30 DIAGNOSIS — Y92009 Unspecified place in unspecified non-institutional (private) residence as the place of occurrence of the external cause: Secondary | ICD-10-CM | POA: Diagnosis not present

## 2020-01-30 DIAGNOSIS — I5022 Chronic systolic (congestive) heart failure: Secondary | ICD-10-CM | POA: Diagnosis not present

## 2020-01-30 DIAGNOSIS — K8689 Other specified diseases of pancreas: Secondary | ICD-10-CM | POA: Diagnosis not present

## 2020-01-30 DIAGNOSIS — K7010 Alcoholic hepatitis without ascites: Secondary | ICD-10-CM

## 2020-01-30 DIAGNOSIS — R000 Tachycardia, unspecified: Secondary | ICD-10-CM

## 2020-01-30 DIAGNOSIS — Z96619 Presence of unspecified artificial shoulder joint: Secondary | ICD-10-CM

## 2020-01-30 DIAGNOSIS — E785 Hyperlipidemia, unspecified: Secondary | ICD-10-CM

## 2020-01-30 DIAGNOSIS — I959 Hypotension, unspecified: Secondary | ICD-10-CM

## 2020-01-30 DIAGNOSIS — F1029 Alcohol dependence with unspecified alcohol-induced disorder: Secondary | ICD-10-CM | POA: Diagnosis not present

## 2020-01-30 DIAGNOSIS — F17200 Nicotine dependence, unspecified, uncomplicated: Secondary | ICD-10-CM

## 2020-01-30 DIAGNOSIS — T84028A Dislocation of other internal joint prosthesis, initial encounter: Principal | ICD-10-CM

## 2020-01-30 DIAGNOSIS — N179 Acute kidney failure, unspecified: Secondary | ICD-10-CM | POA: Diagnosis not present

## 2020-01-30 LAB — CBC
HCT: 29.5 % — ABNORMAL LOW (ref 39.0–52.0)
Hemoglobin: 9.9 g/dL — ABNORMAL LOW (ref 13.0–17.0)
MCH: 34.9 pg — ABNORMAL HIGH (ref 26.0–34.0)
MCHC: 33.6 g/dL (ref 30.0–36.0)
MCV: 103.9 fL — ABNORMAL HIGH (ref 80.0–100.0)
Platelets: 108 10*3/uL — ABNORMAL LOW (ref 150–400)
RBC: 2.84 MIL/uL — ABNORMAL LOW (ref 4.22–5.81)
RDW: 17.9 % — ABNORMAL HIGH (ref 11.5–15.5)
WBC: 6.2 10*3/uL (ref 4.0–10.5)
nRBC: 0 % (ref 0.0–0.2)

## 2020-01-30 LAB — BASIC METABOLIC PANEL
Anion gap: 9 (ref 5–15)
BUN: 11 mg/dL (ref 8–23)
CO2: 28 mmol/L (ref 22–32)
Calcium: 8.3 mg/dL — ABNORMAL LOW (ref 8.9–10.3)
Chloride: 100 mmol/L (ref 98–111)
Creatinine, Ser: 0.81 mg/dL (ref 0.61–1.24)
GFR, Estimated: >60 mL/min (ref >60)
Glucose, Bld: 102 mg/dL — ABNORMAL HIGH (ref 70–99)
Potassium: 4.2 mmol/L (ref 3.5–5.1)
Sodium: 137 mmol/L (ref 135–145)

## 2020-01-30 MED ORDER — SODIUM CHLORIDE 0.9 % IV SOLN: INTRAVENOUS | Status: DC

## 2020-01-30 MED ORDER — LOSARTAN POTASSIUM 25 MG PO TABS: 12.5000 mg | ORAL_TABLET | Freq: Every day | ORAL | Status: DC

## 2020-01-30 MED ORDER — CARVEDILOL 3.125 MG PO TABS
3.1250 mg | ORAL_TABLET | Freq: Two times a day (BID) | ORAL | Status: DC
  Administered 2020-01-30 – 2020-02-05 (×12): 3.125 mg via ORAL
  Filled 2020-01-30 (×12): qty 1

## 2020-01-30 NOTE — Consult Note (Signed)
Cardiology Consultation:   Patient ID: Fernando [name] MRN: [id number]; DOB: 16-May-1955  Admit date: 01/26/2020 Date of Consult: 01/30/2020  Primary Care Provider: [name], MD [facility] Cardiologist: No primary care provider on file. new [place] Electrophysiologist:  None    Patient Profile:   Fernando [name] is a 64 y.o. male with a hx of of chronic systolic CHF EF 43-15% in 10/2019, thought to be ETOH induced and had stopped, ETOH dependence with hx DTs , HTN, HLD who is being seen today for the evaluation of tachycardia after admit for mechanical fall with hip pain at the request of Dr. Ree [name].  History of Present Illness:   Mr. Fernando [name] with above hx and follow up last with cards 11/2019 with plans for repeat echo in Nov.  He had stopped tobacco and ETOH.  Now back to a pint of vodka per day and half pack cigarettes per day.    Now admitted after mechanical fall after drinking last pm. Went to [facility] and getting up he fell lost balance and hit his head.  BP 50 systolic originally.   EKG with RBBB troponin 22, IV fluids given.   Hgb was down to 6.9 now 9.9 after 2 units PRBCs  plts 108 up from 39  Na 137, K+ 4.2, BUN 11 Cr 0.81   EKG:  The EKG was personally reviewed and demonstrates:  ST with RBBB  Telemetry:  Telemetry was personally reviewed and demonstrates:  ST  Up to 155.   BP 123.83 he tells me his HR is always up   Past Medical History:  Diagnosis Date  . Alcohol dependence ([place])   . Chronic systolic (congestive) heart failure (HCC)    EF 25-30% in 10/2019  . Dyslipidemia   . Essential hypertension   . Prosthetic shoulder infection, initial encounter [facility])     Past Surgical History:  Procedure Laterality Date  . COLONOSCOPY     multiple  . HERNIA REPAIR  1990  . KNEE SURGERY  1975  . ROTATOR CUFF REPAIR    . TONSILLECTOMY    . TOTAL SHOULDER REPLACEMENT  2020  . UMBILICAL HERNIA REPAIR       Home Medications:  Prior to Admission medications     Medication Sig Start Date End Date Taking? Authorizing Provider  carvedilol (COREG) 3.125 MG tablet Take 3.125 mg by mouth 2 (two) times daily. 12/03/19  Yes [provider]  folic acid (FOLVITE) 1 MG tablet Take 1 mg by mouth daily. 12/03/19  Yes [provider]  losartan (COZAAR) 25 MG tablet Take 12.5 mg by mouth daily. 11/16/19  Yes [provider]  Multiple Vitamins-Minerals (MULTIVITAMIN WITH MINERALS) tablet Take 1 tablet by mouth daily.   Yes [provider]    Inpatient Medications: Scheduled Meds: . docusate sodium  100 mg Oral BID  . feeding supplement  237 mL Oral TID BM  . folic acid  1 mg Oral Daily  . metoprolol tartrate  25 mg Oral BID  . multivitamin with minerals  1 tablet Oral Daily  . nicotine  14 mg Transdermal Daily  . pantoprazole  40 mg Oral BID  . sodium chloride flush  3 mL Intravenous Q12H  . thiamine  100 mg Oral Daily   Or  . thiamine  100 mg Intravenous Daily   Continuous Infusions: . sodium chloride Stopped (01/29/20 1418)  . sodium chloride 75 mL/hr at 01/29/20 1847  . ampicillin-sulbactam (UNASYN) IV 1.5 g (01/30/20 1118)  PRN Meds: sodium chloride, acetaminophen **OR** acetaminophen, albuterol, bisacodyl, HYDROcodone-acetaminophen, metoprolol tartrate, ondansetron **OR** ondansetron (ZOFRAN) IV, polyethylene glycol  Allergies:   No Known Allergies  Social History:   Social History   Socioeconomic History  . Marital status: Single    Spouse name: Not on file  . Number of children: Not on file  . Years of education: Not on file  . Highest education level: Not on file  Occupational History  . Occupation: retired  Tobacco Use  . Smoking status: Current Every Day Smoker    Packs/day: 0.50    Years: 50.00    Pack years: 25.00  . Smokeless tobacco: Never Used  Substance and Sexual Activity  . Alcohol use: Yes    Alcohol/week: 14.0 standard drinks    Types: 14 Shots of liquor per week  . Drug use: Yes     Types: Marijuana  . Sexual activity: Not on file  Other Topics Concern  . 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 Difficulty of Paying Living Expenses: Not on file  Food Insecurity:   . Worried About Charity fundraiser in the Last Year: Not on file  . Ran Out of Food in the Last Year: Not on file  Transportation Needs:   . Lack of Transportation (Medical): Not on file  . Lack of Transportation (Non-Medical): Not on file  Physical Activity:   . Days of Exercise per Week: Not on file  . Minutes of Exercise per Session: Not on file  Stress:   . Feeling of Stress : Not on file  Social Connections:   . Frequency of Communication with Friends and Family: Not on file  . Frequency of Social Gatherings with Friends and Family: Not on file  . Attends Religious Services: Not on file  . Active Member of Clubs or Organizations: Not on file  . Attends Archivist Meetings: Not on file  . Marital Status: Not on file  Intimate Partner Violence:   . Fear of Current or Ex-Partner: Not on file  . Emotionally Abused: Not on file  . Physically Abused: Not on file  . Sexually Abused: Not on file    Family History:    Family History  Problem Relation Age of Onset  . Heart failure Mother   . Lung cancer Father      ROS:  Please see the history of present illness.  General:no colds or fevers, no weight changes Skin:no rashes or ulcers HEENT:no blurred vision, no congestion CV:see HPI PUL:see HPI GI:no diarrhea constipation or melena, no indigestion GU:no hematuria, no dysuria MS:no joint pain, no claudication Neuro:no syncope, no lightheadedness Endo:no diabetes, no thyroid disease  All other ROS reviewed and negative.     Physical Exam/Data:   Vitals:   01/30/20 0400 01/30/20 0726 01/30/20 1056 01/30/20 1602   BP: 109/82  119/69 123/83  Pulse: 100  (!) 116 (!) 114  Resp: 16  20 20   Temp: 98.5 F (36.9 C) 98.3 F (36.8 C) 98.4 F (36.9 C) 99.9 F (37.7 C)  TempSrc: Oral Oral Oral Oral  SpO2: 95%  98% 97%  Weight:      Height:        Intake/Output Summary (Last 24 hours) at 01/30/2020 1645 Last data filed at 01/30/2020 1335 Gross per  24 hour  Intake 1707.72 ml  Output 725 ml  Net 982.72 ml   Last 3 Weights 01/30/2020 01/29/2020 01/28/2020  Weight (lbs) 168 lb 169 lb 166 lb  Weight (kg) 76.204 kg 76.658 kg 75.297 kg     Body mass index is 26.31 kg/m.  General:  Well nourished, well developed, in no acute distress, some blood in bed from his elbow HEENT: normal Lymph: no adenopathy Neck: no JVD Endocrine:  No thryomegaly Vascular: No carotid bruits; FA pulses 2+ bilaterally without bruits  Cardiac:  normal S1, S2; RRR; no murmur gallup rub or click Lungs:  clear to auscultation bilaterally, + wheezing, rhonchi or rales  Abd: soft, nontender, no hepatomegaly  Ext: no edema Musculoskeletal:  No deformities, BUE and BLE strength normal and equal Skin: warm and dry  Neuro:  Alert and oriented- though at first said he had been in the hospital.  We discussed he was in the hospital. no focal abnormalities noted Psych:  Normal affect     Relevant CV Studies: Echo 10/2019 IMPRESSIONS    1. There is an apical filling defect that is not clearly linear and may  represent thrombus. Based on non-contrast images, this appears organized.  Consider additional imaging such as cMRI at some point to further  evaluate.. Left ventricular ejection  fraction, by estimation, is 25 to 30%. The left ventricle has severely  decreased function. The left ventricle demonstrates global hypokinesis.   FINDINGS  Left Ventricle: There is an apical filling defect that is not clearly  linear and may represent thrombus. Based on non-contrast images, this  appears organized. Consider additional imaging  such as cMRI at some point  to further evaluate. Left ventricular  ejection fraction, by estimation, is 25 to 30%. The left ventricle has  severely decreased function. The left ventricle demonstrates global  hypokinesis. Definity contrast agent was given IV to delineate the left  ventricular endocardial borders.    Echo 01/29/20 IMPRESSIONS    1. Cannot exclude apicoseptal mural thrombus. Left ventricular ejection  fraction, by estimation, is 25 to 30%. The left ventricle has severely  decreased function. The left ventricle demonstrates global hypokinesis.  Left ventricular diastolic parameters  are consistent with Grade I diastolic dysfunction (impaired relaxation).  2. Right ventricular systolic function is mildly reduced. The right  ventricular size is moderately enlarged. Tricuspid regurgitation signal is  inadequate for assessing PA pressure.  3. The mitral valve is normal in structure. No evidence of mitral valve  regurgitation.  4. The aortic valve is normal in structure. Aortic valve regurgitation is  not visualized. No aortic stenosis is present.  5. Aortic dilatation noted. There is mild dilatation of the aortic root,  measuring 39 mm.   Comparison(s): No prior Echocardiogram.   Conclusion(s)/Recommendation(s): Unable to exclude left ventricular  thrombus, would recommend a repeat transthoracic echocardiogram with  contrast. Repeat limited imaging with Definity contrast to exclude left  ventricular apical thrombus.   FINDINGS  Left Ventricle: Cannot exclude apicoseptal mural thrombus. Left  ventricular ejection fraction, by estimation, is 25 to 30%. The left  ventricle has severely decreased function. The left ventricle demonstrates  global hypokinesis. Definity contrast agent  was given IV to delineate the left ventricular endocardial borders. The  left ventricular internal cavity size was normal in size. There is no left  ventricular hypertrophy. Left  ventricular diastolic parameters are  consistent with Grade I diastolic  dysfunction (impaired relaxation). Normal left ventricular filling  pressure.   Right Ventricle:  The right ventricular size is moderately enlarged. No  increase in right ventricular wall thickness. Right ventricular systolic  function is mildly reduced. Tricuspid regurgitation signal is inadequate  for assessing PA pressure.   Left Atrium: Left atrial size was normal in size.   Right Atrium: Right atrial size was normal in size.   Pericardium: There is no evidence of pericardial effusion.   Mitral Valve: The mitral valve is normal in structure. No evidence of  mitral valve regurgitation.   Tricuspid Valve: The tricuspid valve is normal in structure. Tricuspid  valve regurgitation is trivial.   Aortic Valve: The aortic valve is normal in structure. Aortic valve  regurgitation is not visualized. No aortic stenosis is present.   Pulmonic Valve: The pulmonic valve was not well visualized. Pulmonic valve  regurgitation is not visualized.   Aorta: Aortic dilatation noted. There is mild dilatation of the aortic  root, measuring 39 mm.   IAS/Shunts: No atrial level shunt detected by color flow Doppler.     Laboratory Data:  High Sensitivity Troponin:   Recent Labs  Lab 01/26/20 0556 01/26/20 0759  TROPONINIHS 22* 25*     Chemistry Recent Labs  Lab 01/28/20 1109 01/29/20 0249 01/30/20 0509  NA 134* 137 137  K 3.3* 3.6 4.2  CL 99 98 100  CO2 23 30 28   GLUCOSE 103* 110* 102*  BUN 6* 8 11  CREATININE 0.88 0.78 0.81  CALCIUM 7.9* 8.2* 8.3*  GFRNONAA >60 >60 >60  ANIONGAP 12 9 9     Recent Labs  Lab 01/26/20 0223 01/28/20 1109  PROT 5.6*  --   ALBUMIN 2.9* 2.5*  AST 149*  --   ALT 75*  --   ALKPHOS 104  --   BILITOT 1.8*  --    Hematology Recent Labs  Lab 01/27/20 0442 01/27/20 0442 01/28/20 1109 01/29/20 0756 01/30/20 0509  WBC 4.1  --  4.8  --  6.2  RBC 2.23*  --  1.88*  --   2.84*  HGB 8.3*   < > 6.9* 10.5* 9.9*  HCT 23.8*   < > 20.4* 31.1* 29.5*  MCV 106.7*  --  108.5*  --  103.9*  MCH 37.2*  --  36.7*  --  34.9*  MCHC 34.9  --  33.8  --  33.6  RDW 14.1  --  14.0  --  17.9*  PLT 39*  --  63*  --  108*   < > = values in this interval not displayed.   BNPNo results for input(s): BNP, PROBNP in the last 168 hours.  DDimer No results for input(s): DDIMER in the last 168 hours.   Radiology/Studies:  ECHOCARDIOGRAM COMPLETE  Result Date: 01/29/2020    ECHOCARDIOGRAM REPORT   Patient Name:   BAKER MORONTA Date of Exam: 01/29/2020 Medical Rec #:  [id number]      Height:       67.0 in Accession #:    [id number]     Weight:       169.0 lb Date of Birth:  1955-04-22      BSA:          1.883 m Patient Age:    65 years       BP:           80/56 mmHg Patient Gender: M              HR:           109 bpm. Exam  Location:  Inpatient Procedure: 2D Echo, Color Doppler, Cardiac Doppler and Intracardiac            Opacification Agent Indications:    Dyspnea 786.09 / R06.00  History:        Patient has no prior history of Echocardiogram examinations.                 CHF; Risk Factors:Hypertension, Dyslipidemia and ETOH.  Sonographer:    Bernadene Person RDCS Referring Phys: [place]  1. Cannot exclude apicoseptal mural thrombus. Left ventricular ejection fraction, by estimation, is 25 to 30%. The left ventricle has severely decreased function. The left ventricle demonstrates global hypokinesis. Left ventricular diastolic parameters are consistent with Grade I diastolic dysfunction (impaired relaxation).  2. Right ventricular systolic function is mildly reduced. The right ventricular size is moderately enlarged. Tricuspid regurgitation signal is inadequate for assessing PA pressure.  3. The mitral valve is normal in structure. No evidence of mitral valve regurgitation.  4. The aortic valve is normal in structure. Aortic valve regurgitation is not visualized. No  aortic stenosis is present.  5. Aortic dilatation noted. There is mild dilatation of the aortic root, measuring 39 mm. Comparison(s): No prior Echocardiogram. Conclusion(s)/Recommendation(s): Unable to exclude left ventricular thrombus, would recommend a repeat transthoracic echocardiogram with contrast. Repeat limited imaging with Definity contrast to exclude left ventricular apical thrombus. FINDINGS  Left Ventricle: Cannot exclude apicoseptal mural thrombus. Left ventricular ejection fraction, by estimation, is 25 to 30%. The left ventricle has severely decreased function. The left ventricle demonstrates global hypokinesis. Definity contrast agent was given IV to delineate the left ventricular endocardial borders. The left ventricular internal cavity size was normal in size. There is no left ventricular hypertrophy. Left ventricular diastolic parameters are consistent with Grade I diastolic dysfunction (impaired relaxation). Normal left ventricular filling pressure. Right Ventricle: The right ventricular size is moderately enlarged. No increase in right ventricular wall thickness. Right ventricular systolic function is mildly reduced. Tricuspid regurgitation signal is inadequate for assessing PA pressure. Left Atrium: Left atrial size was normal in size. Right Atrium: Right atrial size was normal in size. Pericardium: There is no evidence of pericardial effusion. Mitral Valve: The mitral valve is normal in structure. No evidence of mitral valve regurgitation. Tricuspid Valve: The tricuspid valve is normal in structure. Tricuspid valve regurgitation is trivial. Aortic Valve: The aortic valve is normal in structure. Aortic valve regurgitation is not visualized. No aortic stenosis is present. Pulmonic Valve: The pulmonic valve was not well visualized. Pulmonic valve regurgitation is not visualized. Aorta: Aortic dilatation noted. There is mild dilatation of the aortic root, measuring 39 mm. IAS/Shunts: No atrial  level shunt detected by color flow Doppler.  LEFT VENTRICLE PLAX 2D LVIDd:         4.40 cm      Diastology LVIDs:         4.00 cm      LV e' medial:    4.46 cm/s LV PW:         1.00 cm      LV E/e' medial:  9.7 LV IVS:        1.00 cm      LV e' lateral:   4.51 cm/s LVOT diam:     2.30 cm      LV E/e' lateral: 9.6 LV SV:         50 LV SV Index:   26 LVOT Area:     4.15 cm  LV  Volumes (MOD) LV vol d, MOD A4C: 105.0 ml LV vol s, MOD A4C: 58.8 ml LV SV MOD A4C:     105.0 ml RIGHT VENTRICLE RV S prime:     11.40 cm/s TAPSE (M-mode): 1.5 cm LEFT ATRIUM             Index       RIGHT ATRIUM           Index LA diam:        2.90 cm 1.54 cm/m  RA Area:     12.90 cm LA Vol (A2C):   56.1 ml 29.80 ml/m RA Volume:   31.60 ml  16.79 ml/m LA Vol (A4C):   30.0 ml 15.94 ml/m LA Biplane Vol: 42.4 ml 22.52 ml/m  AORTIC VALVE LVOT Vmax:   84.10 cm/s LVOT Vmean:  56.300 cm/s LVOT VTI:    0.120 m  AORTA Ao Root diam: 3.90 cm Ao Asc diam:  3.10 cm MITRAL VALVE MV Area (PHT): 1.97 cm    SHUNTS MV Decel Time: 386 msec    Systemic VTI:  0.12 m MV E velocity: 43.20 cm/s  Systemic Diam: 2.30 cm MV A velocity: 83.40 cm/s MV E/A ratio:  0.52 Mihai Croitoru MD Electronically signed by Sanda Klein MD Signature Date/Time: 01/29/2020/5:23:49 PM    Final      Assessment and Plan:   1. ST has had since July and has ben difficult to titrate up coreg.  Pulse in office was 83, now on lopressor instead of coreg no chest pain   Would go back to coreg 3.125 BID and continue losartan. Dr. [place] to see.  2. CM  Same now as in July on max meds.had added losartan 12.5 daily --continue. 3. ETOH and tobacco use have asked to stop again.                  For questions or updates, please contact [place] Please consult [email or web address] for contact info under    Signed, Cecilie Kicks, NP  01/30/2020 4:45 PM

## 2020-01-30 NOTE — Progress Notes (Signed)
PROGRESS NOTE    Fernando [name]  RDE:[id number] DOB: 12/12/1955 DOA: 01/26/2020 PCP: [name], MD   Brief Narrative:  HPI on 01/26/2020 by [name] Fernando [name] is a 63 y.o. male with medical history significant of chronic systolic CHF (EF 63-14% in 10/2019);  ETOH dependence with DTs during 10/2019 admission; prosthetic shoulder joint infection; HTN; and HLD presenting after a fall.   He reports that he got up off the commode overnight, lost his balance, and hit his head with significant bleeding.  He had been drinking "a little bit" of alcohol, about as much as he usually drinks.  He reports that his R hip hurts but he is able to move it; he isn't sure he can bear weight on it.  His forehead is also sore.   Interim history  Patient admitted with fall, hypotension-likely secondary to alcohol.  Found to have sinus tachycardia.  Also noted to have anemia and received blood transfusion.  FOBT was positive.  Gastroenterology and cardiology consulted. Assessment & Plan   Acute respiratory failure with hypoxia likely secondary to aspiration pneumonia -In the setting of alcohol inebriation -Patient was not septic on admission -During hospitalization, patient was noted to have fevers and was started on empiric antibiotics -He has remained afebrile for several days now -Continue Unasyn -Culture data has remained negative to date  Alcohol dependence/acute metabolic encephalopathy -Admits to drinking 6 drinks per day and was noted to have an elevated alcohol level on admission 224 -Has had a previous history of delirium tremens with an admission to ICU this year -Continue thiamine, folate -initially was placed on Librium, however this was discontinued and he is now on CIWA protocol -Continue to monitor closely  Anemia/GI bleed -Hemoglobin dropped to 6.9-patient was given 1 unit PRBC -Hemoglobin today 9.9 (of note, hemoglobin range between 11-13 in July 2021) -FOBT was  positive -Patient admits to having dark stools for some time now -Patient did have a colonoscopy a couple of years ago -Gastroenterology consulted and appreciated -Continue to monitor CBC  Sinus tachycardia/Cardiomyopathy -Coreg was discontinued and patient was started on metoprolol -Echocardiogram shows an EF of 25 to 30%, LV severely decreased function, global hypokinesis.  LV diastolic parameters consistent with grade 1 diastolic dysfunction. (No change when compared to echocardiogram from 10/27/2019) -Currently 115 bpm -Question if this is due to anemia versus possible withdrawal -Cardiology consulted and appreciated -Patient states that he recently saw Dr. [name] as an outpatient  Essential hypertension with recurrent hypotension -In the setting of alcohol intoxication leading to hypovolemia with acute kidney injury on presentation -BP has been soft and normal  Acute kidney injury -Suspect secondary to prerenal azotemia in the setting of alcohol abuse as well as medications -Patient was on losartan prior to admission -Creatinine was 1.4 on admission, currently down to 0.81 -Continue to monitor BMP  Chronic thrombocytopenia -Suspect secondary to bone marrow suppression from alcohol abuse -Platelets are trending upward, continue to monitor CBC  Fall -Prior to admission, occurred at home -Patient reported losing his balance (see was inebriated) -PT and OT recommending SNF placement  Mild hypovolemic hyponatremia -Resolved with IV fluids  Hypomagnesemia -Resolved with replacement  Hypokalemia -Resolved with replacement, continue to monitor  Shoulder dislocation -Likely secondary to fall -patient had anterior dislocation which was reduced in the ED -Patient to see orthopedic surgery, Dr. Griffin Basil, as an outpatient  Tobacco dependence -Smoking cessation discussed  Unstable pressure ulcer -Present on admission -Continue wound care   DVT Prophylaxis  SCDs  Code  Status: Full  Family Communication: None at bedside  Disposition Plan:  Status is: Inpatient  Remains inpatient appropriate because:Hemodynamically unstable, Ongoing diagnostic testing needed not appropriate for outpatient work up and Inpatient level of care appropriate due to severity of illness   Dispo: The patient is from: Home              Anticipated d/c is to: SNF vs Home w/ [facility]              Anticipated d/c date is: 2 days              Patient currently is not medically stable to d/c.   Consultants Cardiology Gastroenterology  Procedures  Echocardiogram  Antibiotics   Anti-infectives (From admission, onward)   Start     Dose/Rate Route Frequency Ordered Stop   01/28/20 1000  ampicillin-sulbactam (UNASYN) 1.5 g in sodium chloride 0.9 % 100 mL IVPB        1.5 g 200 mL/hr over 30 Minutes Intravenous Every 6 hours 01/28/20 0854        Subjective:   Fernando [name] seen and examined today.  Patient states he is feeling tired this morning.  Denies current chest pain or shortness of breath, abdominal pain, nausea or vomiting, dizziness or headache.  States he has had some dark stools for several months.  Objective:   Vitals:   01/30/20 0022 01/30/20 0400 01/30/20 0726 01/30/20 1056  BP: 116/75 109/82  119/69  Pulse: (!) 101 100  (!) 116  Resp: 17 16  20   Temp: 98.6 F (37 C) 98.5 F (36.9 C) 98.3 F (36.8 C) 98.4 F (36.9 C)  TempSrc: Oral Oral Oral Oral  SpO2: 95% 95%  98%  Weight: 76.2 kg     Height:        Intake/Output Summary (Last 24 hours) at 01/30/2020 1308 Last data filed at 01/30/2020 0844 Gross per 24 hour  Intake 1470.72 ml  Output 725 ml  Net 745.72 ml   Filed Weights   01/28/20 0025 01/29/20 0200 01/30/20 0022  Weight: 75.3 kg 76.7 kg 76.2 kg    Exam  General: Well developed, chronically ill-appearing, NAD  HEENT: [place], sutures noted on right eyebrow, along with several areas of facial bruising, mucous membranes moist.   Cardiovascular: S1  S2 auscultated, tachycardic  Respiratory: Diminished, however clear, occasional cough  Abdomen: Soft, nontender, nondistended, + bowel sounds  Extremities: warm dry without cyanosis clubbing or edema  Neuro: AAOx3, nonfocal  Psych: Appropriate mood and affect   Data Reviewed: I have personally reviewed following labs and imaging studies  CBC: Recent Labs  Lab 01/26/20 0223 01/26/20 0227 01/26/20 1122 01/27/20 0442 01/28/20 1109 01/29/20 0756 01/30/20 0509  WBC 6.9  --  6.6 4.1 4.8  --  6.2  NEUTROABS  --   --   --   --  3.6  --   --   HGB 10.1*   < > 9.1* 8.3* 6.9* 10.5* 9.9*  HCT 29.3*   < > 27.3* 23.8* 20.4* 31.1* 29.5*  MCV 107.3*  --  108.3* 106.7* 108.5*  --  103.9*  PLT 69*  --  52* 39* 63*  --  108*   < > = values in this interval not displayed.   Basic Metabolic Panel: Recent Labs  Lab 01/26/20 0223 01/26/20 0223 01/26/20 0227 01/27/20 0441 01/27/20 0442 01/28/20 1109 01/29/20 0249 01/30/20 0509  NA 131*   < > 131*  --  131* 134* 137 137  K 4.2   < > 3.9  --  2.8* 3.3* 3.6 4.2  CL 95*   < > 93*  --  93* 99 98 100  CO2 20*  --   --   --  23 23 30 28   GLUCOSE 134*   < > 128*  --  107* 103* 110* 102*  BUN 16   < > 15  --  9 6* 8 11  CREATININE 1.32*   < > 1.40*  --  0.94 0.88 0.78 0.81  CALCIUM 8.1*  --   --   --  8.3* 7.9* 8.2* 8.3*  MG  --   --   --  0.9*  --  2.2  --   --   PHOS  --   --   --   --   --  1.3*  --   --    < > = values in this interval not displayed.   GFR: Estimated Creatinine Clearance: 83.9 mL/min (by C-G formula based on SCr of 0.81 mg/dL). Liver Function Tests: Recent Labs  Lab 01/26/20 0223 01/28/20 1109  AST 149*  --   ALT 75*  --   ALKPHOS 104  --   BILITOT 1.8*  --   PROT 5.6*  --   ALBUMIN 2.9* 2.5*   No results for input(s): LIPASE, AMYLASE in the last 168 hours. No results for input(s): AMMONIA in the last 168 hours. Coagulation Profile: Recent Labs  Lab 01/26/20 0223  INR 1.1   Cardiac Enzymes: No  results for input(s): CKTOTAL, CKMB, CKMBINDEX, TROPONINI in the last 168 hours. BNP (last 3 results) No results for input(s): PROBNP in the last 8760 hours. HbA1C: No results for input(s): HGBA1C in the last 72 hours. CBG: No results for input(s): GLUCAP in the last 168 hours. Lipid Profile: No results for input(s): CHOL, HDL, LDLCALC, TRIG, CHOLHDL, LDLDIRECT in the last 72 hours. Thyroid Function Tests: No results for input(s): TSH, T4TOTAL, FREET4, T3FREE, THYROIDAB in the last 72 hours. Anemia Panel: No results for input(s): VITAMINB12, FOLATE, FERRITIN, TIBC, IRON, RETICCTPCT in the last 72 hours. Urine analysis: No results found for: COLORURINE, APPEARANCEUR, LABSPEC, PHURINE, GLUCOSEU, HGBUR, BILIRUBINUR, KETONESUR, PROTEINUR, UROBILINOGEN, NITRITE, LEUKOCYTESUR Sepsis Labs: @LABRCNTIP (procalcitonin:4,lacticidven:4)  ) Recent Results (from the past 240 hour(s))  Respiratory Panel by RT PCR (Flu A&B, Covid) - Nasopharyngeal Swab     Status: None   Collection Time: 01/26/20  2:27 AM   Specimen: Nasopharyngeal Swab  Result Value Ref Range Status   SARS Coronavirus 2 by RT PCR NEGATIVE NEGATIVE Final    Comment: (NOTE) SARS-CoV-2 target nucleic acids are NOT DETECTED.  The SARS-CoV-2 RNA is generally detectable in upper respiratoy specimens during the acute phase of infection. The lowest concentration of SARS-CoV-2 viral copies this assay can detect is 131 copies/mL. A negative result does not preclude SARS-Cov-2 infection and should not be used as the sole basis for treatment or other patient management decisions. A negative result may occur with  improper specimen collection/handling, submission of specimen other than nasopharyngeal swab, presence of viral mutation(s) within the areas targeted by this assay, and inadequate number of viral copies (<131 copies/mL). A negative result must be combined with clinical observations, patient history, and epidemiological  information. The expected result is Negative.  Fact Sheet for Patients:  PinkCheek.be  Fact Sheet for Healthcare Providers:  GravelBags.it  This test is no t yet approved or cleared by the [place] FDA  and  has been authorized for detection and/or diagnosis of SARS-CoV-2 by FDA under an Emergency Use Authorization (EUA). This EUA will remain  in effect (meaning this test can be used) for the duration of the COVID-19 declaration under Section 564(b)(1) of the Act, 21 U.S.C. section 360bbb-3(b)(1), unless the authorization is terminated or revoked sooner.     Influenza A by PCR NEGATIVE NEGATIVE Final   Influenza B by PCR NEGATIVE NEGATIVE Final    Comment: (NOTE) The Xpert Xpress SARS-CoV-2/FLU/RSV assay is intended as an aid in  the diagnosis of influenza from Nasopharyngeal swab specimens and  should not be used as a sole basis for treatment. Nasal washings and  aspirates are unacceptable for Xpert Xpress SARS-CoV-2/FLU/RSV  testing.  Fact Sheet for Patients: PinkCheek.be  Fact Sheet for Healthcare Providers: GravelBags.it  This test is not yet approved or cleared by the [place] FDA and  has been authorized for detection and/or diagnosis of SARS-CoV-2 by  FDA under an Emergency Use Authorization (EUA). This EUA will remain  in effect (meaning this test can be used) for the duration of the  Covid-19 declaration under Section 564(b)(1) of the Act, 21  U.S.C. section 360bbb-3(b)(1), unless the authorization is  terminated or revoked. Performed at [facility], [place] [address], [place], [place] [address]       Radiology Studies: ECHOCARDIOGRAM COMPLETE  Result Date: 01/29/2020    ECHOCARDIOGRAM REPORT   Patient Name:   Fernando [name] Date of Exam: 01/29/2020 Medical Rec #:  [id number]      Height:       67.0 in Accession #:    [id number]      Weight:       169.0 lb Date of Birth:  01-07-1956      BSA:          1.883 m Patient Age:    11 years       BP:           80/56 mmHg Patient Gender: M              HR:           109 bpm. Exam Location:  Inpatient Procedure: 2D Echo, Color Doppler, Cardiac Doppler and Intracardiac            Opacification Agent Indications:    Dyspnea 786.09 / R06.00  History:        Patient has no prior history of Echocardiogram examinations.                 CHF; Risk Factors:Hypertension, Dyslipidemia and ETOH.  Sonographer:    Bernadene Person RDCS Referring Phys: [place]  1. Cannot exclude apicoseptal mural thrombus. Left ventricular ejection fraction, by estimation, is 25 to 30%. The left ventricle has severely decreased function. The left ventricle demonstrates global hypokinesis. Left ventricular diastolic parameters are consistent with Grade I diastolic dysfunction (impaired relaxation).  2. Right ventricular systolic function is mildly reduced. The right ventricular size is moderately enlarged. Tricuspid regurgitation signal is inadequate for assessing PA pressure.  3. The mitral valve is normal in structure. No evidence of mitral valve regurgitation.  4. The aortic valve is normal in structure. Aortic valve regurgitation is not visualized. No aortic stenosis is present.  5. Aortic dilatation noted. There is mild dilatation of the aortic root, measuring 39 mm. Comparison(s): No prior Echocardiogram. Conclusion(s)/Recommendation(s): Unable to exclude left ventricular thrombus, would recommend a repeat transthoracic echocardiogram with  contrast. Repeat limited imaging with Definity contrast to exclude left ventricular apical thrombus. FINDINGS  Left Ventricle: Cannot exclude apicoseptal mural thrombus. Left ventricular ejection fraction, by estimation, is 25 to 30%. The left ventricle has severely decreased function. The left ventricle demonstrates global hypokinesis. Definity contrast agent was  given IV to delineate the left ventricular endocardial borders. The left ventricular internal cavity size was normal in size. There is no left ventricular hypertrophy. Left ventricular diastolic parameters are consistent with Grade I diastolic dysfunction (impaired relaxation). Normal left ventricular filling pressure. Right Ventricle: The right ventricular size is moderately enlarged. No increase in right ventricular wall thickness. Right ventricular systolic function is mildly reduced. Tricuspid regurgitation signal is inadequate for assessing PA pressure. Left Atrium: Left atrial size was normal in size. Right Atrium: Right atrial size was normal in size. Pericardium: There is no evidence of pericardial effusion. Mitral Valve: The mitral valve is normal in structure. No evidence of mitral valve regurgitation. Tricuspid Valve: The tricuspid valve is normal in structure. Tricuspid valve regurgitation is trivial. Aortic Valve: The aortic valve is normal in structure. Aortic valve regurgitation is not visualized. No aortic stenosis is present. Pulmonic Valve: The pulmonic valve was not well visualized. Pulmonic valve regurgitation is not visualized. Aorta: Aortic dilatation noted. There is mild dilatation of the aortic root, measuring 39 mm. IAS/Shunts: No atrial level shunt detected by color flow Doppler.  LEFT VENTRICLE PLAX 2D LVIDd:         4.40 cm      Diastology LVIDs:         4.00 cm      LV e' medial:    4.46 cm/s LV PW:         1.00 cm      LV E/e' medial:  9.7 LV IVS:        1.00 cm      LV e' lateral:   4.51 cm/s LVOT diam:     2.30 cm      LV E/e' lateral: 9.6 LV SV:         50 LV SV Index:   26 LVOT Area:     4.15 cm  LV Volumes (MOD) LV vol d, MOD A4C: 105.0 ml LV vol s, MOD A4C: 58.8 ml LV SV MOD A4C:     105.0 ml RIGHT VENTRICLE RV S prime:     11.40 cm/s TAPSE (M-mode): 1.5 cm LEFT ATRIUM             Index       RIGHT ATRIUM           Index LA diam:        2.90 cm 1.54 cm/m  RA Area:     12.90 cm  LA Vol (A2C):   56.1 ml 29.80 ml/m RA Volume:   31.60 ml  16.79 ml/m LA Vol (A4C):   30.0 ml 15.94 ml/m LA Biplane Vol: 42.4 ml 22.52 ml/m  AORTIC VALVE LVOT Vmax:   84.10 cm/s LVOT Vmean:  56.300 cm/s LVOT VTI:    0.120 m  AORTA Ao Root diam: 3.90 cm Ao Asc diam:  3.10 cm MITRAL VALVE MV Area (PHT): 1.97 cm    SHUNTS MV Decel Time: 386 msec    Systemic VTI:  0.12 m MV E velocity: 43.20 cm/s  Systemic Diam: 2.30 cm MV A velocity: 83.40 cm/s MV E/A ratio:  0.52 Mihai Croitoru MD Electronically signed by Sanda Klein MD Signature Date/Time: 01/29/2020/5:23:49 PM  Final      Scheduled Meds: . docusate sodium  100 mg Oral BID  . feeding supplement  237 mL Oral TID BM  . folic acid  1 mg Oral Daily  . LORazepam  0-4 mg Intravenous Q8H  . metoprolol tartrate  25 mg Oral BID  . multivitamin with minerals  1 tablet Oral Daily  . nicotine  14 mg Transdermal Daily  . pantoprazole  40 mg Oral BID  . sodium chloride flush  3 mL Intravenous Q12H  . thiamine  100 mg Oral Daily   Or  . thiamine  100 mg Intravenous Daily   Continuous Infusions: . sodium chloride Stopped (01/29/20 1418)  . sodium chloride 75 mL/hr at 01/29/20 1847  . ampicillin-sulbactam (UNASYN) IV 1.5 g (01/30/20 1118)     LOS: 3 days   Time Spent in minutes   45 minutes  Jamison Soward D.O. on 01/30/2020 at 1:08 PM  Between 7am to 7pm - Please see pager noted on [email or web address]  After 7pm go to [email or web address]  And look for the night coverage person covering for me after hours  [facility] Office  [phone]

## 2020-01-30 NOTE — Progress Notes (Signed)
Patient improving significantly; is A&0 x 4 today, having appropriate conversation, showing his sense of humor. He was up with therapy in a chair today for a few hours; can be a 1-2 assist. No ativan needed today, no pain meds requested. Only a slight tremor noticed when performing focused tasks. Patient restarted on coreg this evening- will monitor for hypotension. Patient is scheduled for an EGD tomorrow due to positive blood in stool x 2. Orders in, waiting to be released. Patient dressings changed today and new IV placed due to accidental dislodgement of previous one.

## 2020-01-30 NOTE — Progress Notes (Signed)
Physical Therapy Treatment Patient Details Name: Fernando [name] MRN: [id number] DOB: 1955/11/10 Today's Date: 01/30/2020    History of Present Illness Pt is a 64 y.o. M with significant PMH of CHF with reduced EF, hypertension and chronic alcohol use. Presents after a fall. Found to have right shoulder dislocation and subsequently reduced. 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     PT Comments    Pt seated in recliner on arrival this session.  Pt required encouragement to participate in PT session.  Pt continues to benefit from skilled rehab in post acute setting to maximize functional gains before returning home.  Pt remains very weak and unsteady at this time.    Follow Up Recommendations  SNF     Equipment Recommendations  None recommended by PT    Recommendations for Other Services       Precautions / Restrictions Precautions Precautions: Fall Precaution Comments: watch HR Required Braces or Orthoses: Sling (does not use.) Restrictions Weight Bearing Restrictions: Yes RUE Weight Bearing: Non weight bearing Other Position/Activity Restrictions: patient continues to decline sling.    Mobility  Bed Mobility Overal bed mobility: Needs Assistance Bed Mobility: Supine to Sit;Sit to Supine     Supine to sit: Mod assist     General bed mobility comments: Pt seated in recliner on arrival.  Transfers Overall transfer level: Needs assistance Equipment used: None (face to face with support from gt belt.) Transfers: Sit to/from Stand Sit to Stand: Mod assist Stand pivot transfers: Mod assist       General transfer comment: Cues for hand placement to and from seated surface.  Cues for R shoulder precautions as he attempts to reach and pull with R shoulder.  Pt performed x2 trials this session.  Pt able to stand for 10-20 secs.  HR elevated to 126 bpm max.  Ambulation/Gait Ambulation/Gait assistance:  (NT)               Stairs              Wheelchair Mobility    Modified Rankin (Stroke Patients Only)       Balance Overall balance assessment: Needs assistance Sitting-balance support: Feet supported Sitting balance-Leahy Scale: Fair   Postural control: Posterior lean Standing balance support: Single extremity supported Standing balance-Leahy Scale: Poor Standing balance comment: Reliant on external support due to strong posterior lean.  Pt required cues for hip and trunk extension in standing.                            Cognition Arousal/Alertness: Awake/alert Behavior During Therapy: Flat affect Overall Cognitive Status: Impaired/Different from baseline Area of Impairment: Memory;Following commands;Safety/judgement;Problem solving                     Memory: Decreased short-term memory;Decreased recall of precautions Following Commands: Follows one step commands with increased time Safety/Judgement: Decreased awareness of safety;Decreased awareness of deficits   Problem Solving: Requires verbal cues General Comments: Pt alert but remains very flat, pleasant and cooperative during PT session.      Exercises General Exercises - Lower Extremity Long Arc Quad: AROM;Both;10 reps;Seated Hip Flexion/Marching: AROM;Both;10 reps;Seated Other Exercises Other Exercises: Seated Pillow squeeze 1x10 reps. BLEs.    General Comments        Pertinent Vitals/Pain Pain Assessment: 0-10 Pain Score: 5  Pain Location: R shoulder Pain Descriptors / Indicators: Discomfort Pain Intervention(s): Monitored during session;Limited activity within  patient's tolerance    Home Living                      Prior Function            PT Goals (current goals can now be found in the care plan section) Acute Rehab PT Goals Patient Stated Goal: I need to walk better Potential to Achieve Goals: Fair Progress towards PT goals: Progressing toward goals    Frequency    Min 2X/week      PT  Plan Frequency needs to be updated    Co-evaluation              AM-PAC PT "6 Clicks" Mobility   Outcome Measure  Help needed turning from your back to your side while in a flat bed without using bedrails?: A Lot Help needed moving from lying on your back to sitting on the side of a flat bed without using bedrails?: A Lot Help needed moving to and from a bed to a chair (including a wheelchair)?: A Lot Help needed standing up from a chair using your arms (e.g., wheelchair or bedside chair)?: A Lot Help needed to walk in hospital room?: A Lot Help needed climbing 3-5 steps with a railing? : Total 6 Click Score: 11    End of Session Equipment Utilized During Treatment: Gait belt Activity Tolerance: Patient limited by fatigue;Patient limited by lethargy Patient left: with call bell/phone within reach;in chair;with chair alarm set Nurse Communication: Mobility status PT Visit Diagnosis: Unsteadiness on feet (R26.81);Other abnormalities of gait and mobility (R26.89);History of falling (Z91.81);Difficulty in walking, not elsewhere classified (R26.2);Pain Pain - Right/Left: Right Pain - part of body: Shoulder     Time: 1101-1115 PT Time Calculation (min) (ACUTE ONLY): 14 min  Charges:  $Therapeutic Activity: 8-22 mins                     Fernando [name] , PTA Acute Rehabilitation Services Pager [phone] Office [phone]     Fernando [name] 01/30/2020, 11:41 AM

## 2020-01-30 NOTE — Consult Note (Addendum)
[facility] Consult: 3:47 PM 01/30/2020  LOS: 3 days    Referring Provider: Dr Ree Kida  Primary Care Physician:  [name], MD Primary Gastroenterologist:  Dr. Henrene Pastor Patient has a second medical record number which was also reviewed    Reason for Consultation:  FOBT + anemia   HPI: Fernando [name] is a 64 y.o. male.  PMH Heart failure.  Alcoholism.  DTs during July 2021 admission.  Prosthetic shoulder infection.  Hypertension.  Hyperlipidemia.  Thrombocytopenia, platelets 57 in July 2021.  Hepatic steatosis per ultrasound of 10/2019.  Adenomatous colon polyps in 2007 (small TAs), 2012 (TA's and HP), 2019 (TAs). Latest colonoscopy 2019 with two, 3 to 4 mm, polyps in the descending and transverse colon otherwise normal study.  Admission in late July 2021 with hypoxic respiratory failure, alcoholic hepatitis, alcohol withdrawal with DTs.  Macrocytic anemia, pneumonitis, physical deconditioning, possible apical thrombus.  Did not undergo coronary catheterization due to low platelets, chronic alcoholism and he would not be deemed a candidate for anticoagulation for the same reasons.    Presented to ED 10/23 after fall, right shoulder dislocation which was reduced.  Prior history of shoulder replacement.  Weakness, deconditioning, cognitive decline, poor balance noted and SNF rehab recommended.  Was actively drinking just prior to fall and admission. We are asked to see the patient regarding acute on chronic anemia. No PPI PTA.  Had tachycardia in setting of alcohol withdrawal and hypovolemia. Fevers with bilateral lower lobe infiltrates concerning for aspiration treating with Unasyn.  Hgb 10.1 >> 6.9 over 3 days.  Transfused 2 PRBCs to 10.5 >> 9.1 today.  MCV 108. Hgb was 10.9 at the end of July, 3 months ago  Platelets as low as 39, 108 today FOBT positive INR 1.1 BUN is not elevated, normal renal function. T bili 1.8.  Alkaline phosphatase 104.  AST/ALT 149/75. ETOH level 224.   Troponin Is 22 >> 25. CTAP with contrast: Left hip contusion without fracture.  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  Interstitial lung disease and atelectasis.  Marked hepatic steatosis. 2D echocardiogram shows LVEF 25 to 29%, grade 1 diastolic dysfunction. CT head: chronic small vessel ischemic changes.  Right frontal scalp hematoma.  No skull or facial fractures.  Severe left C4/5 foraminal stenosis.  Pt having black, loose stools for a few months.  No nausea or vomiting.  Appetite fair.  Weight stable.  No abdominal swelling, pain no nausea or vomiting or heartburn..  No previous problems with anemia or blood transfusion requirements.  Denies excessive or unusual bleeding.  No PPI etc PTA  Patient consumes 1.75 liter bottle of vodka weekly.  Smokes 10 cigs per day.  Lives alone.  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      Past Medical History:  Diagnosis Date  . Alcohol dependence ([place])   . Chronic systolic (congestive) heart failure (H[facility])    EF 25-30% in 10/2019  . Dyslipidemia   .  Essential hypertension   . Prosthetic shoulder infection, initial encounter [facility])     Past Surgical History:  Procedure Laterality Date  . COLONOSCOPY     multiple  . HERNIA REPAIR  1990  . KNEE SURGERY  1975  . ROTATOR CUFF REPAIR    . TONSILLECTOMY    . TOTAL SHOULDER REPLACEMENT  2020  . UMBILICAL HERNIA REPAIR      Prior to Admission medications   Medication Sig Start Date End Date Taking? Authorizing Provider  carvedilol (COREG) 3.125 MG tablet Take 3.125 mg by mouth 2 (two) times daily. 12/03/19  Yes [provider]  folic acid (FOLVITE) 1 MG tablet Take 1 mg  by mouth daily. 12/03/19  Yes [provider]  losartan (COZAAR) 25 MG tablet Take 12.5 mg by mouth daily. 11/16/19  Yes [provider]  Multiple Vitamins-Minerals (MULTIVITAMIN WITH MINERALS) tablet Take 1 tablet by mouth daily.   Yes [provider]    Scheduled Meds: . docusate sodium  100 mg Oral BID  . feeding supplement  237 mL Oral TID BM  . folic acid  1 mg Oral Daily  . LORazepam  0-4 mg Intravenous Q8H  . metoprolol tartrate  25 mg Oral BID  . multivitamin with minerals  1 tablet Oral Daily  . nicotine  14 mg Transdermal Daily  . pantoprazole  40 mg Oral BID  . sodium chloride flush  3 mL Intravenous Q12H  . thiamine  100 mg Oral Daily   Or  . thiamine  100 mg Intravenous Daily   Infusions: . sodium chloride Stopped (01/29/20 1418)  . sodium chloride 75 mL/hr at 01/29/20 1847  . ampicillin-sulbactam (UNASYN) IV 1.5 g (01/30/20 1118)   PRN Meds: sodium chloride, acetaminophen **OR** acetaminophen, albuterol, bisacodyl, HYDROcodone-acetaminophen, metoprolol tartrate, ondansetron **OR** ondansetron (ZOFRAN) IV, polyethylene glycol   Allergies as of 01/26/2020  . (No Known Allergies)    Family History  Problem Relation Age of Onset  . Heart failure Mother   . 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  Denies shortness of breath at rest CV:  No palpitations, no LE edema.  No angina GU:  No hematuria, no frequency GI: See HPI Heme: See HPI Transfusions: See HPI Neuro:  No headaches, no peripheral tingling or numbness.  Denies LOC and seizures. Derm:  No itching, no rash or sores.  Endocrine:  No sweats or chills.  No polyuria or dysuria Immunization: Not queried. Travel:  None beyond local  counties in last few months.    PHYSICAL EXAM: Vital signs in last 24 hours: Vitals:   01/30/20 0726 01/30/20 1056  BP:  119/69  Pulse:  (!) 116  Resp:  20  Temp: 98.3 F (36.8 C) 98.4 F (36.9 C)  SpO2:  98%   Wt Readings from Last 3 Encounters:  01/30/20 76.2 kg    General: Patient looks acutely and chronically ill.  Old appearing for stated age.  Alert, comfortable sitting in bedside lounge chair. Head: No facial asymmetry.  Sutures above the right eyebrow.  Bruising on the face. Eyes: No scleral icterus or conjunctival pallor Ears: Slight HOH Nose: No discharge Mouth: Tongue midline.  Mucosa moist, pink, clear. Neck: No JVD,  masses, thyromegaly. Lungs: Diminished breath sounds globally.  Wet cough.  No dyspnea with speaking or at rest Heart: RRR.  No MRG.  S1, S2 present Abdomen: Slightly tense and slightly distended.  Not tender.  No HSM, masses, bruits, hernias appreciated active bowel sounds..   Rectal: Deferred, patient in lounge chair so logistics for DRE problematic Musc/Skeltl: No gross joint deformity. Extremities: No CCE. Neurologic: Alert.  Oriented times place, person and "1921".  Able to provide good history.  Moves all 4 limbs.  No tremor.  Strength not tested. Skin: Lacerations, abrasions on the face.  Covered wounds on the legs.  Bruising on the right arm Nodes: No cervical adenopathy Psych: Cooperative, flat affect.  Intake/Output from previous day: 10/26 0701 - 10/27 0700 In: 2050.7 [P.O.:820; I.V.:819.9; IV Piggyback:410.9] Out: 1725 [Urine:1725]  LAB RESULTS: Recent Labs    01/28/20 1109 01/29/20 0756 01/30/20 0509  WBC 4.8  --  6.2  HGB 6.9* 10.5* 9.9*  HCT 20.4* 31.1* 29.5*  PLT 63*  --  108*   BMET Lab Results  Component Value Date   NA 137 01/30/2020   NA 137 01/29/2020   NA 134 (L) 01/28/2020   K 4.2 01/30/2020   K 3.6 01/29/2020   K 3.3 (L) 01/28/2020   CL 100 01/30/2020   CL 98 01/29/2020   CL 99 01/28/2020   CO2 28  01/30/2020   CO2 30 01/29/2020   CO2 23 01/28/2020   GLUCOSE 102 (H) 01/30/2020   GLUCOSE 110 (H) 01/29/2020   GLUCOSE 103 (H) 01/28/2020   BUN 11 01/30/2020   BUN 8 01/29/2020   BUN 6 (L) 01/28/2020   CREATININE 0.81 01/30/2020   CREATININE 0.78 01/29/2020   CREATININE 0.88 01/28/2020   CALCIUM 8.3 (L) 01/30/2020   CALCIUM 8.2 (L) 01/29/2020   CALCIUM 7.9 (L) 01/28/2020   LFT Recent Labs    01/28/20 1109  ALBUMIN 2.5*   PT/INR Lab Results  Component Value Date   INR 1.1 01/26/2020   Lab Results  Component Value Date   ALT 75 (H) 01/26/2020   AST 149 (H) 01/26/2020   ALKPHOS 104 01/26/2020   BILITOT 1.8 (H) 01/26/2020      RADIOLOGY STUDIES: ECHOCARDIOGRAM COMPLETE  Result Date: 01/29/2020    ECHOCARDIOGRAM REPORT   Patient Name:   KAMONTE MCMICHEN Date of Exam: 01/29/2020 Medical Rec #:  [id number]      Height:       67.0 in Accession #:    [id number]     Weight:       169.0 lb Date of Birth:  07-11-1955      BSA:          1.883 m Patient Age:    57 years       BP:           80/56 mmHg Patient Gender: M              HR:           109 bpm. Exam Location:  Inpatient Procedure: 2D Echo, Color Doppler, Cardiac Doppler and Intracardiac            Opacification Agent Indications:    Dyspnea 786.09 / R06.00  History:        Patient has no prior history of Echocardiogram examinations.                 CHF; Risk Factors:Hypertension, Dyslipidemia and ETOH.  Sonographer:    Bernadene Person  RDCS Referring Phys: [place]  1. Cannot exclude apicoseptal mural thrombus. Left ventricular ejection fraction, by estimation, is 25 to 30%. The left ventricle has severely decreased function. The left ventricle demonstrates global hypokinesis. Left ventricular diastolic parameters are consistent with Grade I diastolic dysfunction (impaired relaxation).  2. Right ventricular systolic function is mildly reduced. The right ventricular size is moderately enlarged. Tricuspid  regurgitation signal is inadequate for assessing PA pressure.  3. The mitral valve is normal in structure. No evidence of mitral valve regurgitation.  4. The aortic valve is normal in structure. Aortic valve regurgitation is not visualized. No aortic stenosis is present.  5. Aortic dilatation noted. There is mild dilatation of the aortic root, measuring 39 mm. Comparison(s): No prior Echocardiogram. Conclusion(s)/Recommendation(s): Unable to exclude left ventricular thrombus, would recommend a repeat transthoracic echocardiogram with contrast. Repeat limited imaging with Definity contrast to exclude left ventricular apical thrombus. FINDINGS  Left Ventricle: Cannot exclude apicoseptal mural thrombus. Left ventricular ejection fraction, by estimation, is 25 to 30%. The left ventricle has severely decreased function. The left ventricle demonstrates global hypokinesis. Definity contrast agent was given IV to delineate the left ventricular endocardial borders. The left ventricular internal cavity size was normal in size. There is no left ventricular hypertrophy. Left ventricular diastolic parameters are consistent with Grade I diastolic dysfunction (impaired relaxation). Normal left ventricular filling pressure. Right Ventricle: The right ventricular size is moderately enlarged. No increase in right ventricular wall thickness. Right ventricular systolic function is mildly reduced. Tricuspid regurgitation signal is inadequate for assessing PA pressure. Left Atrium: Left atrial size was normal in size. Right Atrium: Right atrial size was normal in size. Pericardium: There is no evidence of pericardial effusion. Mitral Valve: The mitral valve is normal in structure. No evidence of mitral valve regurgitation. Tricuspid Valve: The tricuspid valve is normal in structure. Tricuspid valve regurgitation is trivial. Aortic Valve: The aortic valve is normal in structure. Aortic valve regurgitation is not visualized. No aortic  stenosis is present. Pulmonic Valve: The pulmonic valve was not well visualized. Pulmonic valve regurgitation is not visualized. Aorta: Aortic dilatation noted. There is mild dilatation of the aortic root, measuring 39 mm. IAS/Shunts: No atrial level shunt detected by color flow Doppler.  LEFT VENTRICLE PLAX 2D LVIDd:         4.40 cm      Diastology LVIDs:         4.00 cm      LV e' medial:    4.46 cm/s LV PW:         1.00 cm      LV E/e' medial:  9.7 LV IVS:        1.00 cm      LV e' lateral:   4.51 cm/s LVOT diam:     2.30 cm      LV E/e' lateral: 9.6 LV SV:         50 LV SV Index:   26 LVOT Area:     4.15 cm  LV Volumes (MOD) LV vol d, MOD A4C: 105.0 ml LV vol s, MOD A4C: 58.8 ml LV SV MOD A4C:     105.0 ml RIGHT VENTRICLE RV S prime:     11.40 cm/s TAPSE (M-mode): 1.5 cm LEFT ATRIUM             Index       RIGHT ATRIUM           Index LA diam:  2.90 cm 1.54 cm/m  RA Area:     12.90 cm LA Vol (A2C):   56.1 ml 29.80 ml/m RA Volume:   31.60 ml  16.79 ml/m LA Vol (A4C):   30.0 ml 15.94 ml/m LA Biplane Vol: 42.4 ml 22.52 ml/m  AORTIC VALVE LVOT Vmax:   84.10 cm/s LVOT Vmean:  56.300 cm/s LVOT VTI:    0.120 m  AORTA Ao Root diam: 3.90 cm Ao Asc diam:  3.10 cm MITRAL VALVE MV Area (PHT): 1.97 cm    SHUNTS MV Decel Time: 386 msec    Systemic VTI:  0.12 m MV E velocity: 43.20 cm/s  Systemic Diam: 2.30 cm MV A velocity: 83.40 cm/s MV E/A ratio:  0.52 Mihai Croitoru MD Electronically signed by Sanda Klein MD Signature Date/Time: 01/29/2020/5:23:49 PM    Final      IMPRESSION:   *   Acute on chronic anemia requiring transfusion. FOBT positive black stools.  R/o ulcer disease, portal hypertensive gastropathy, esophageal or gastric varices.  *     Thrombocytopenia.   INR WNL.  *     Alcoholic liver disease with alcoholic hepatitis and possible cirrhosis Elevated alcohol level at time of admission.  *    General physical and cognitive decline.  Fell at home.  Dislocated prosthetic shoulder joint  reduced and scalp hematoma.  *     Systolic and diastolic CHF.  *   History recurrent adenomatous polyps of the colon on scopes 2007, 2012 and 08/2017.  *    Non-Covid 19, bilateral lower lobe pneumonia.  Possibly related to aspiration.  Day 3 Unasyn.  * tail of pancreas mass   PLAN:     *   EGD?? Will d/w Dr Carlean Purl.  Patient is agreeable to this.  No plans to pursue this today so can leave heart healthy diet in place.  Protonix 40 mg po bid in place (started 10/25, none PTA).     Azucena Freed, PA-C  01/30/2020, 3:47 PM Phone [phone]      [place] Attending   I have taken an interval history, reviewed the chart and examined the patient. I agree with the Advanced Practitioner's note, impression and recommendations.   The patient undoubtedly has alcoholic liver disease with suggestion of portal hypertension with a low platelets.  He has had what sounds like melena and acute blood loss anemia as well.  He is certainly at risk for portal hypertensive gastropathy, esophageal varices, and alcoholic gastritis causing bleeding.  An upper GI endoscopy is appropriate.  I explained that to him and he understands and agrees to proceed.  We will plan to do this tomorrow under the direction of Dr. Loletha Carrow.. The risks and benefits as well as alternatives of endoscopic procedure(s) have been discussed and reviewed. All questions answered. The patient agrees to proceed.   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  This will need further characterization with other imaging MR versus endoscopic ultrasound.  Would probably start with an MRI and MRCP but we do not need to be in a hurry to do that.  We would want to make sure he could cooperate with a good breath-hold.  And I am not sure that is possible yet.  Gatha Mayer, MD, [facility] 01/30/2020 3:54 PM  [phone]

## 2020-01-30 NOTE — H&P (View-Only) (Signed)
[facility] Consult: 3:47 PM 01/30/2020  LOS: 3 days    Referring Provider: Dr Ree Kida  Primary Care Physician:  [name], MD Primary Gastroenterologist:  Dr. Henrene Pastor Patient has a second medical record number which was also reviewed    Reason for Consultation:  FOBT + anemia   HPI: Fernando [name] is a 64 y.o. male.  PMH Heart failure.  Alcoholism.  DTs during July 2021 admission.  Prosthetic shoulder infection.  Hypertension.  Hyperlipidemia.  Thrombocytopenia, platelets 57 in July 2021.  Hepatic steatosis per ultrasound of 10/2019.  Adenomatous colon polyps in 2007 (small TAs), 2012 (TA's and HP), 2019 (TAs). Latest colonoscopy 2019 with two, 3 to 4 mm, polyps in the descending and transverse colon otherwise normal study.  Admission in late July 2021 with hypoxic respiratory failure, alcoholic hepatitis, alcohol withdrawal with DTs.  Macrocytic anemia, pneumonitis, physical deconditioning, possible apical thrombus.  Did not undergo coronary catheterization due to low platelets, chronic alcoholism and he would not be deemed a candidate for anticoagulation for the same reasons.    Presented to ED 10/23 after fall, right shoulder dislocation which was reduced.  Prior history of shoulder replacement.  Weakness, deconditioning, cognitive decline, poor balance noted and SNF rehab recommended.  Was actively drinking just prior to fall and admission. We are asked to see the patient regarding acute on chronic anemia. No PPI PTA.  Had tachycardia in setting of alcohol withdrawal and hypovolemia. Fevers with bilateral lower lobe infiltrates concerning for aspiration treating with Unasyn.  Hgb 10.1 >> 6.9 over 3 days.  Transfused 2 PRBCs to 10.5 >> 9.1 today.  MCV 108. Hgb was 10.9 at the end of July, 3 months  ago Platelets as low as 39, 108 today FOBT positive INR 1.1 BUN is not elevated, normal renal function. T bili 1.8.  Alkaline phosphatase 104.  AST/ALT 149/75. ETOH level 224.   Troponin Is 22 >> 25. CTAP with contrast: Left hip contusion without fracture.  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  Interstitial lung disease and atelectasis.  Marked hepatic steatosis. 2D echocardiogram shows LVEF 25 to 37%, grade 1 diastolic dysfunction. CT head: chronic small vessel ischemic changes.  Right frontal scalp hematoma.  No skull or facial fractures.  Severe left C4/5 foraminal stenosis.  Pt having black, loose stools for a few months.  No nausea or vomiting.  Appetite fair.  Weight stable.  No abdominal swelling, pain no nausea or vomiting or heartburn..  No previous problems with anemia or blood transfusion requirements.  Denies excessive or unusual bleeding.  No PPI etc PTA  Patient consumes 1.75 liter bottle of vodka weekly.  Smokes 10 cigs per day.  Lives alone.  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      Past Medical History:  Diagnosis Date  . Alcohol dependence ([place])   . Chronic systolic (congestive) heart failure (HCC)    EF 25-30% in 10/2019  . Dyslipidemia   .  Essential hypertension   . Prosthetic shoulder infection, initial encounter [facility])     Past Surgical History:  Procedure Laterality Date  . COLONOSCOPY     multiple  . HERNIA REPAIR  1990  . KNEE SURGERY  1975  . ROTATOR CUFF REPAIR    . TONSILLECTOMY    . TOTAL SHOULDER REPLACEMENT  2020  . UMBILICAL HERNIA REPAIR      Prior to Admission medications   Medication Sig Start Date End Date Taking? Authorizing Provider  carvedilol (COREG) 3.125 MG tablet Take 3.125 mg by mouth 2 (two) times daily. 12/03/19  Yes [provider]  folic acid (FOLVITE) 1 MG tablet Take  1 mg by mouth daily. 12/03/19  Yes [provider]  losartan (COZAAR) 25 MG tablet Take 12.5 mg by mouth daily. 11/16/19  Yes [provider]  Multiple Vitamins-Minerals (MULTIVITAMIN WITH MINERALS) tablet Take 1 tablet by mouth daily.   Yes [provider]    Scheduled Meds: . docusate sodium  100 mg Oral BID  . feeding supplement  237 mL Oral TID BM  . folic acid  1 mg Oral Daily  . LORazepam  0-4 mg Intravenous Q8H  . metoprolol tartrate  25 mg Oral BID  . multivitamin with minerals  1 tablet Oral Daily  . nicotine  14 mg Transdermal Daily  . pantoprazole  40 mg Oral BID  . sodium chloride flush  3 mL Intravenous Q12H  . thiamine  100 mg Oral Daily   Or  . thiamine  100 mg Intravenous Daily   Infusions: . sodium chloride Stopped (01/29/20 1418)  . sodium chloride 75 mL/hr at 01/29/20 1847  . ampicillin-sulbactam (UNASYN) IV 1.5 g (01/30/20 1118)   PRN Meds: sodium chloride, acetaminophen **OR** acetaminophen, albuterol, bisacodyl, HYDROcodone-acetaminophen, metoprolol tartrate, ondansetron **OR** ondansetron (ZOFRAN) IV, polyethylene glycol   Allergies as of 01/26/2020  . (No Known Allergies)    Family History  Problem Relation Age of Onset  . Heart failure Mother   . 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  Denies shortness of breath at rest CV:  No palpitations, no LE edema.  No angina GU:  No hematuria, no frequency GI: See HPI Heme: See HPI Transfusions: See HPI Neuro:  No headaches, no peripheral tingling or numbness.  Denies LOC and seizures. Derm:  No itching, no rash or sores.  Endocrine:  No sweats or chills.  No polyuria or dysuria Immunization: Not queried. Travel:  None beyond  local counties in last few months.    PHYSICAL EXAM: Vital signs in last 24 hours: Vitals:   01/30/20 0726 01/30/20 1056  BP:  119/69  Pulse:  (!) 116  Resp:  20  Temp: 98.3 F (36.8 C) 98.4 F (36.9 C)  SpO2:  98%   Wt Readings from Last 3 Encounters:  01/30/20 76.2 kg    General: Patient looks acutely and chronically ill.  Old appearing for stated age.  Alert, comfortable sitting in bedside lounge chair. Head: No facial asymmetry.  Sutures above the right eyebrow.  Bruising on the face. Eyes: No scleral icterus or conjunctival pallor Ears: Slight HOH Nose: No discharge Mouth: Tongue midline.  Mucosa moist, pink, clear. Neck: No JVD,  masses, thyromegaly. Lungs: Diminished breath sounds globally.  Wet cough.  No dyspnea with speaking or at rest Heart: RRR.  No MRG.  S1, S2 present Abdomen: Slightly tense and slightly distended.  Not tender.  No HSM, masses, bruits, hernias appreciated active bowel sounds..   Rectal: Deferred, patient in lounge chair so logistics for DRE problematic Musc/Skeltl: No gross joint deformity. Extremities: No CCE. Neurologic: Alert.  Oriented times place, person and "1921".  Able to provide good history.  Moves all 4 limbs.  No tremor.  Strength not tested. Skin: Lacerations, abrasions on the face.  Covered wounds on the legs.  Bruising on the right arm Nodes: No cervical adenopathy Psych: Cooperative, flat affect.  Intake/Output from previous day: 10/26 0701 - 10/27 0700 In: 2050.7 [P.O.:820; I.V.:819.9; IV Piggyback:410.9] Out: 1725 [Urine:1725]  LAB RESULTS: Recent Labs    01/28/20 1109 01/29/20 0756 01/30/20 0509  WBC 4.8  --  6.2  HGB 6.9* 10.5* 9.9*  HCT 20.4* 31.1* 29.5*  PLT 63*  --  108*   BMET Lab Results  Component Value Date   NA 137 01/30/2020   NA 137 01/29/2020   NA 134 (L) 01/28/2020   K 4.2 01/30/2020   K 3.6 01/29/2020   K 3.3 (L) 01/28/2020   CL 100 01/30/2020   CL 98 01/29/2020   CL 99 01/28/2020   CO2 28  01/30/2020   CO2 30 01/29/2020   CO2 23 01/28/2020   GLUCOSE 102 (H) 01/30/2020   GLUCOSE 110 (H) 01/29/2020   GLUCOSE 103 (H) 01/28/2020   BUN 11 01/30/2020   BUN 8 01/29/2020   BUN 6 (L) 01/28/2020   CREATININE 0.81 01/30/2020   CREATININE 0.78 01/29/2020   CREATININE 0.88 01/28/2020   CALCIUM 8.3 (L) 01/30/2020   CALCIUM 8.2 (L) 01/29/2020   CALCIUM 7.9 (L) 01/28/2020   LFT Recent Labs    01/28/20 1109  ALBUMIN 2.5*   PT/INR Lab Results  Component Value Date   INR 1.1 01/26/2020   Lab Results  Component Value Date   ALT 75 (H) 01/26/2020   AST 149 (H) 01/26/2020   ALKPHOS 104 01/26/2020   BILITOT 1.8 (H) 01/26/2020      RADIOLOGY STUDIES: ECHOCARDIOGRAM COMPLETE  Result Date: 01/29/2020    ECHOCARDIOGRAM REPORT   Patient Name:   Fernando [name] Date of Exam: 01/29/2020 Medical Rec #:  [id number]      Height:       67.0 in Accession #:    [id number]     Weight:       169.0 lb Date of Birth:  [birth year]      BSA:          1.883 m Patient Age:    43 years       BP:           80/56 mmHg Patient Gender: M              HR:           109 bpm. Exam Location:  Inpatient Procedure: 2D Echo, Color Doppler, Cardiac Doppler and Intracardiac            Opacification Agent Indications:    Dyspnea 786.09 / R06.00  History:        Patient has no prior history of Echocardiogram examinations.                 CHF; Risk Factors:Hypertension, Dyslipidemia and ETOH.  Sonographer:    Bernadene Person  RDCS Referring Phys: [place]  1. Cannot exclude apicoseptal mural thrombus. Left ventricular ejection fraction, by estimation, is 25 to 30%. The left ventricle has severely decreased function. The left ventricle demonstrates global hypokinesis. Left ventricular diastolic parameters are consistent with Grade I diastolic dysfunction (impaired relaxation).  2. Right ventricular systolic function is mildly reduced. The right ventricular size is moderately enlarged. Tricuspid  regurgitation signal is inadequate for assessing PA pressure.  3. The mitral valve is normal in structure. No evidence of mitral valve regurgitation.  4. The aortic valve is normal in structure. Aortic valve regurgitation is not visualized. No aortic stenosis is present.  5. Aortic dilatation noted. There is mild dilatation of the aortic root, measuring 39 mm. Comparison(s): No prior Echocardiogram. Conclusion(s)/Recommendation(s): Unable to exclude left ventricular thrombus, would recommend a repeat transthoracic echocardiogram with contrast. Repeat limited imaging with Definity contrast to exclude left ventricular apical thrombus. FINDINGS  Left Ventricle: Cannot exclude apicoseptal mural thrombus. Left ventricular ejection fraction, by estimation, is 25 to 30%. The left ventricle has severely decreased function. The left ventricle demonstrates global hypokinesis. Definity contrast agent was given IV to delineate the left ventricular endocardial borders. The left ventricular internal cavity size was normal in size. There is no left ventricular hypertrophy. Left ventricular diastolic parameters are consistent with Grade I diastolic dysfunction (impaired relaxation). Normal left ventricular filling pressure. Right Ventricle: The right ventricular size is moderately enlarged. No increase in right ventricular wall thickness. Right ventricular systolic function is mildly reduced. Tricuspid regurgitation signal is inadequate for assessing PA pressure. Left Atrium: Left atrial size was normal in size. Right Atrium: Right atrial size was normal in size. Pericardium: There is no evidence of pericardial effusion. Mitral Valve: The mitral valve is normal in structure. No evidence of mitral valve regurgitation. Tricuspid Valve: The tricuspid valve is normal in structure. Tricuspid valve regurgitation is trivial. Aortic Valve: The aortic valve is normal in structure. Aortic valve regurgitation is not visualized. No aortic  stenosis is present. Pulmonic Valve: The pulmonic valve was not well visualized. Pulmonic valve regurgitation is not visualized. Aorta: Aortic dilatation noted. There is mild dilatation of the aortic root, measuring 39 mm. IAS/Shunts: No atrial level shunt detected by color flow Doppler.  LEFT VENTRICLE PLAX 2D LVIDd:         4.40 cm      Diastology LVIDs:         4.00 cm      LV e' medial:    4.46 cm/s LV PW:         1.00 cm      LV E/e' medial:  9.7 LV IVS:        1.00 cm      LV e' lateral:   4.51 cm/s LVOT diam:     2.30 cm      LV E/e' lateral: 9.6 LV SV:         50 LV SV Index:   26 LVOT Area:     4.15 cm  LV Volumes (MOD) LV vol d, MOD A4C: 105.0 ml LV vol s, MOD A4C: 58.8 ml LV SV MOD A4C:     105.0 ml RIGHT VENTRICLE RV S prime:     11.40 cm/s TAPSE (M-mode): 1.5 cm LEFT ATRIUM             Index       RIGHT ATRIUM           Index LA diam:  2.90 cm 1.54 cm/m  RA Area:     12.90 cm LA Vol (A2C):   56.1 ml 29.80 ml/m RA Volume:   31.60 ml  16.79 ml/m LA Vol (A4C):   30.0 ml 15.94 ml/m LA Biplane Vol: 42.4 ml 22.52 ml/m  AORTIC VALVE LVOT Vmax:   84.10 cm/s LVOT Vmean:  56.300 cm/s LVOT VTI:    0.120 m  AORTA Ao Root diam: 3.90 cm Ao Asc diam:  3.10 cm MITRAL VALVE MV Area (PHT): 1.97 cm    SHUNTS MV Decel Time: 386 msec    Systemic VTI:  0.12 m MV E velocity: 43.20 cm/s  Systemic Diam: 2.30 cm MV A velocity: 83.40 cm/s MV E/A ratio:  0.52 Mihai Croitoru MD Electronically signed by Sanda Klein MD Signature Date/Time: 01/29/2020/5:23:49 PM    Final      IMPRESSION:   *   Acute on chronic anemia requiring transfusion. FOBT positive black stools.  R/o ulcer disease, portal hypertensive gastropathy, esophageal or gastric varices.  *     Thrombocytopenia.   INR WNL.  *     Alcoholic liver disease with alcoholic hepatitis and possible cirrhosis Elevated alcohol level at time of admission.  *    General physical and cognitive decline.  Fell at home.  Dislocated prosthetic shoulder joint  reduced and scalp hematoma.  *     Systolic and diastolic CHF.  *   History recurrent adenomatous polyps of the colon on scopes 2007, 2012 and 08/2017.  *    Non-Covid 19, bilateral lower lobe pneumonia.  Possibly related to aspiration.  Day 3 Unasyn.  * tail of pancreas mass   PLAN:     *   EGD?? Will d/w Dr Carlean Purl.  Patient is agreeable to this.  No plans to pursue this today so can leave heart healthy diet in place.  Protonix 40 mg po bid in place (started 10/25, none PTA).     Azucena Freed, PA-C  01/30/2020, 3:47 PM Phone [phone]      [place] Attending   I have taken an interval history, reviewed the chart and examined the patient. I agree with the Advanced Practitioner's note, impression and recommendations.   The patient undoubtedly has alcoholic liver disease with suggestion of portal hypertension with a low platelets.  He has had what sounds like melena and acute blood loss anemia as well.  He is certainly at risk for portal hypertensive gastropathy, esophageal varices, and alcoholic gastritis causing bleeding.  An upper GI endoscopy is appropriate.  I explained that to him and he understands and agrees to proceed.  We will plan to do this tomorrow under the direction of Dr. Loletha Carrow.. The risks and benefits as well as alternatives of endoscopic procedure(s) have been discussed and reviewed. All questions answered. The patient agrees to proceed.   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  This will need further characterization with other imaging MR versus endoscopic ultrasound.  Would probably start with an MRI and MRCP but we do not need to be in a hurry to do that.  We would want to make sure he could cooperate with a good breath-hold.  And I am not sure that is possible yet.  Gatha Mayer, MD, [facility] 01/30/2020 3:54 PM  [phone]

## 2020-01-30 NOTE — Progress Notes (Signed)
Occupational Therapy Treatment Patient Details Name: Fernando [name] MRN: [id number] DOB: 1955/07/21 Today's Date: 01/30/2020    History of present illness Pt is a 64 y.o. M with significant PMH of CHF with reduced EF, hypertension and chronic alcohol use. Presents after a fall. Found to have right shoulder dislocation and subsequently reduced. 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    OT comments  Patient continues to present with poor stand balance, weakness, decreased activity tolerance, declines to cognition and NWB to R upper extremity.  His ability to follow commands is improving and he is participating better.  Focus was mobility to improve toilet transfers and toilet skills.  Able to use RW with L arm and tactile cues to bring weight forward.  OT will continue to follow in acute.  SNF is needed to regain independence.    Follow Up Recommendations  SNF;Supervision/Assistance - 24 hour    Equipment Recommendations  Wheelchair cushion (measurements OT);Wheelchair (measurements OT);Tub/shower seat;3 in 1 bedside commode    Recommendations for Other Services      Precautions / Restrictions Precautions Precautions: Fall Precaution Comments: watch HR Required Braces or Orthoses: Sling Restrictions Weight Bearing Restrictions: Yes RUE Weight Bearing: Non weight bearing Other Position/Activity Restrictions: patient continues to decline sling.       Mobility Bed Mobility Overal bed mobility: Needs Assistance Bed Mobility: Supine to Sit;Sit to Supine     Supine to sit: Mod assist        Transfers Overall transfer level: Needs assistance Equipment used: 1 person hand held assist Transfers: Sit to/from [employer] Transfers Sit to Stand: Mod assist Stand pivot transfers: Mod assist            Balance Overall balance assessment: Needs assistance Sitting-balance support: Feet supported Sitting balance-Leahy Scale: Fair   Postural control: Posterior  lean Standing balance support: Single extremity supported Standing balance-Leahy Scale: Poor                                                  Cognition Arousal/Alertness: Awake/alert Behavior During Therapy: Flat affect                         Memory: Decreased short-term memory;Decreased recall of precautions Following Commands: Follows one step commands with increased time Safety/Judgement: Decreased awareness of safety;Decreased awareness of deficits   Problem Solving: Requires verbal cues                            Pertinent Vitals/ Pain       Pain Assessment: No/denies pain                                                          Frequency  Min 2X/week        Progress Toward Goals  OT Goals(current goals can now be found in the care plan section)     Acute Rehab OT Goals Patient Stated Goal: I need to walk better OT Goal Formulation: With patient Time For Goal Achievement: 02/10/20 Potential to Achieve Goals: [place]  Co-evaluation                 AM-PAC OT "6 Clicks" Daily Activity     Outcome Measure   Help from another person eating meals?: A Little Help from another person taking care of personal grooming?: A Little Help from another person toileting, which includes using toliet, bedpan, or urinal?: A Lot Help from another person bathing (including washing, rinsing, drying)?: A Lot Help from another person to put on and taking off regular upper body clothing?: A Lot Help from another person to put on and taking off regular lower body clothing?: A Lot 6 Click Score: 14    End of Session Equipment Utilized During Treatment: Gait belt;Oxygen  OT Visit Diagnosis: Other abnormalities of gait and mobility (R26.89);Other symptoms and signs involving cognitive function   Activity Tolerance Patient limited by fatigue   Patient Left with call bell/phone within reach;in  chair;with chair alarm set   Nurse Communication Mobility status        Time: 2423-5361 OT Time Calculation (min): 17 min  Charges: OT General Charges $OT Visit: 1 Visit OT Treatments $Therapeutic Activity: 8-22 mins  01/30/2020  Rich, OTR/L  Acute Rehabilitation Services  Office:  [phone]    Fernando [name] 01/30/2020, 10:26 AM

## 2020-01-31 ENCOUNTER — Encounter (HOSPITAL_COMMUNITY): Payer: Self-pay | Admitting: Internal Medicine

## 2020-01-31 ENCOUNTER — Inpatient Hospital Stay (HOSPITAL_COMMUNITY): Payer: Medicare Other | Admitting: Certified Registered Nurse Anesthetist

## 2020-01-31 ENCOUNTER — Encounter (HOSPITAL_COMMUNITY)
Admission: EM | Disposition: A | Discharge status: Home Health | Payer: Self-pay | Source: Home / Self Care | Attending: Internal Medicine

## 2020-01-31 DIAGNOSIS — K921 Melena: Secondary | ICD-10-CM | POA: Diagnosis not present

## 2020-01-31 DIAGNOSIS — W19XXXA Unspecified fall, initial encounter: Secondary | ICD-10-CM | POA: Diagnosis not present

## 2020-01-31 DIAGNOSIS — I5022 Chronic systolic (congestive) heart failure: Secondary | ICD-10-CM | POA: Diagnosis not present

## 2020-01-31 DIAGNOSIS — N179 Acute kidney failure, unspecified: Secondary | ICD-10-CM | POA: Diagnosis not present

## 2020-01-31 DIAGNOSIS — K3189 Other diseases of stomach and duodenum: Secondary | ICD-10-CM | POA: Diagnosis not present

## 2020-01-31 DIAGNOSIS — K766 Portal hypertension: Secondary | ICD-10-CM | POA: Diagnosis not present

## 2020-01-31 DIAGNOSIS — F1029 Alcohol dependence with unspecified alcohol-induced disorder: Secondary | ICD-10-CM | POA: Diagnosis not present

## 2020-01-31 HISTORY — PX: ESOPHAGOGASTRODUODENOSCOPY (EGD) WITH PROPOFOL: SHX5813

## 2020-01-31 LAB — CBC
HCT: 29.7 % — ABNORMAL LOW (ref 39.0–52.0)
Hemoglobin: 9.7 g/dL — ABNORMAL LOW (ref 13.0–17.0)
MCH: 34.5 pg — ABNORMAL HIGH (ref 26.0–34.0)
MCHC: 32.7 g/dL (ref 30.0–36.0)
MCV: 105.7 fL — ABNORMAL HIGH (ref 80.0–100.0)
Platelets: 142 10*3/uL — ABNORMAL LOW (ref 150–400)
RBC: 2.81 MIL/uL — ABNORMAL LOW (ref 4.22–5.81)
RDW: 17.3 % — ABNORMAL HIGH (ref 11.5–15.5)
WBC: 6 10*3/uL (ref 4.0–10.5)
nRBC: 0 % (ref 0.0–0.2)

## 2020-01-31 LAB — MAGNESIUM: Magnesium: 1.5 mg/dL — ABNORMAL LOW (ref 1.7–2.4)

## 2020-01-31 LAB — BASIC METABOLIC PANEL
Anion gap: 10 (ref 5–15)
BUN: 12 mg/dL (ref 8–23)
CO2: 27 mmol/L (ref 22–32)
Calcium: 8.9 mg/dL (ref 8.9–10.3)
Chloride: 99 mmol/L (ref 98–111)
Creatinine, Ser: 0.87 mg/dL (ref 0.61–1.24)
GFR, Estimated: >60 mL/min (ref >60)
Glucose, Bld: 112 mg/dL — ABNORMAL HIGH (ref 70–99)
Potassium: 4.1 mmol/L (ref 3.5–5.1)
Sodium: 136 mmol/L (ref 135–145)

## 2020-01-31 SURGERY — ESOPHAGOGASTRODUODENOSCOPY (EGD) WITH PROPOFOL
Anesthesia: Monitor Anesthesia Care

## 2020-01-31 MED ORDER — FAMOTIDINE 20 MG PO TABS
40.0000 mg | ORAL_TABLET | Freq: Every day | ORAL | Status: DC
  Administered 2020-01-31 – 2020-02-05 (×6): 40 mg via ORAL
  Filled 2020-01-31 (×6): qty 2

## 2020-01-31 MED ORDER — SODIUM CHLORIDE 0.9 % IV SOLN
INTRAVENOUS | Status: DC | PRN
  Administered 2020-02-01 (×2): 1000 mL via INTRAVENOUS

## 2020-01-31 MED ORDER — ESMOLOL HCL 100 MG/10ML IV SOLN
INTRAVENOUS | Status: DC | PRN
  Administered 2020-01-31: 10 mg via INTRAVENOUS

## 2020-01-31 MED ORDER — PHENYLEPHRINE 40 MCG/ML (10ML) SYRINGE FOR IV PUSH (FOR BLOOD PRESSURE SUPPORT)
PREFILLED_SYRINGE | INTRAVENOUS | Status: DC | PRN
  Administered 2020-01-31: 80 ug via INTRAVENOUS

## 2020-01-31 MED ORDER — MAGNESIUM SULFATE 2 GM/50ML IV SOLN
2.0000 g | Freq: Once | INTRAVENOUS | Status: AC
  Administered 2020-01-31: 2 g via INTRAVENOUS
  Filled 2020-01-31: qty 50

## 2020-01-31 MED ORDER — PROPOFOL 500 MG/50ML IV EMUL
INTRAVENOUS | Status: DC | PRN
  Administered 2020-01-31: 100 ug/kg/min via INTRAVENOUS

## 2020-01-31 SURGICAL SUPPLY — 15 items

## 2020-01-31 NOTE — Progress Notes (Signed)
Pts MEWS score was red in endoscopy during procedure, now back to the green when back to the floor

## 2020-01-31 NOTE — Op Note (Signed)
[facility] Patient Name: Fernando [name] Procedure Date : 01/31/2020 MRN: [id number] Attending MD: Estill Cotta. Loletha Carrow , MD Date of Birth: 06-07-1955 CSN: [id number] Age: 64 Admit Type: Inpatient Procedure:                Upper GI endoscopy Indications:              Iron deficiency anemia secondary to chronic blood                            loss, Melena (reported months of loose, black                            stool. Macrocytic anemia, alcohol abuse with                            probable cirrhosis, thrombocytopenia) Providers:                Estill Cotta. Loletha Carrow, MD, Jeanella Cara, RN, Erenest Rasher, RN, Fransico Setters Mbumina, Technician Referring MD:             [facility] Medicines:                Monitored Anesthesia Care Complications:            No immediate complications. Estimated Blood Loss:     Estimated blood loss: none. Procedure:                Pre-Anesthesia Assessment:                           - Prior to the procedure, a History and Physical                            was performed, and patient medications and                            allergies were reviewed. The patient's tolerance of                            previous anesthesia was also reviewed. The risks                            and benefits of the procedure and the sedation                            options and risks were discussed with the patient.                            All questions were answered, and informed consent                            was obtained. Prior Anticoagulants: The patient has  taken no previous anticoagulant or antiplatelet                            agents. ASA Grade Assessment: IV - A patient with                            severe systemic disease that is a constant threat                            to life. After reviewing the risks and benefits,                            the patient was deemed in  satisfactory condition to                            undergo the procedure.                           After obtaining informed consent, the endoscope was                            passed under direct vision. Throughout the                            procedure, the patient's blood pressure, pulse, and                            oxygen saturations were monitored continuously. The                            GIF-H190 ([id number]) Olympus gastroscope was                            introduced through the mouth, and advanced to the                            second part of duodenum. The upper GI endoscopy was                            accomplished without difficulty. The patient                            tolerated the procedure fairly well. Scope In: Scope Out: Findings:      The esophagus was normal.      Mild portal hypertensive gastropathy was found in the proximal gastric       body and less so patchy in the gastric antrum.      The cardia and gastric fundus were normal on retroflexion.      Nodular mucosa was found in the duodenal bulb.      The exam of the duodenum was otherwise normal. Impression:               - Normal esophagus.                           -  Portal hypertensive gastropathy.                           - Nodular mucosa in the duodenal bulb.                           - No specimens collected.                           No source of anemia/heme positive stool seen. Recommendation:           - Return patient to hospital ward for ongoing care.                           - Low sodium diet.                           - Continue present medications.                           - Daily CBC                           GI will follow. Patient not currently in optimal                            condition to complete bowel preparation and undergo                            colonoscopy. Procedure Code(s):        --- Professional ---                           [phone],  Esophagogastroduodenoscopy, flexible,                            transoral; diagnostic, including collection of                            specimen(s) by brushing or washing, when performed                            (separate procedure) Diagnosis Code(s):        --- Professional ---                           K76.6, Portal hypertension                           K31.89, Other diseases of stomach and duodenum                           D50.0, Iron deficiency anemia secondary to blood                            loss (chronic)  K92.1, Melena (includes Hematochezia) CPT copyright 2019 [employer]. All rights reserved. The codes documented in this report are preliminary and upon coder review may  be revised to meet current compliance requirements. Nyah Shepherd L. Loletha Carrow, MD 01/31/2020 10:43:54 AM This report has been signed electronically. Number of Addenda: 0

## 2020-01-31 NOTE — Progress Notes (Signed)
Per staff patient took his only IV out. Currently no IV access,. Will try to place another one or place consult for IV tam before procedure.

## 2020-01-31 NOTE — Anesthesia Postprocedure Evaluation (Signed)
Anesthesia Post Note  Patient: Fernando [name]  Procedure(s) Performed: ESOPHAGOGASTRODUODENOSCOPY (EGD) WITH PROPOFOL (N/A )     Patient location during evaluation: Endoscopy Anesthesia Type: MAC Level of consciousness: awake Pain management: pain level controlled Vital Signs Assessment: post-procedure vital signs reviewed and stable Respiratory status: spontaneous breathing, nonlabored ventilation, respiratory function stable and patient connected to nasal cannula oxygen Cardiovascular status: stable and blood pressure returned to baseline Postop Assessment: no apparent nausea or vomiting Anesthetic complications: no   No complications documented.  Last Vitals:  Vitals:   01/31/20 1549 01/31/20 1956  BP: 114/75 114/67  Pulse: (!) 106   Resp: 18   Temp: 36.5 C 36.7 C  SpO2: 95%     Last Pain:  Vitals:   01/31/20 1956  TempSrc: Oral  PainSc:                  Aubrey Voong P Urian Martenson

## 2020-01-31 NOTE — TOC Progression Note (Signed)
Transition of Care [facility]) - Progression Note    Patient Details  Name: Fernando [name] MRN: [id number] Date of Birth: 1955-07-24  [facility] CM/SW Contact  Zenon Mayo, RN Phone Number: 01/31/2020, 10:02 PM  Clinical Narrative:    From home fall, hypotension-likely secondary to alcohol,  Tachycardia, anemia and received blood transfusion. TOC team will cont to follow for dc needs.        Expected Discharge Plan and Services                                                 Social Determinants of Health (SDOH) Interventions    Readmission Risk Interventions No flowsheet data found.

## 2020-01-31 NOTE — Care Management Important Message (Signed)
Important Message  Patient Details  Name: Fernando [name] MRN: [id number] Date of Birth: Sep 27, 1955   Medicare Important Message Given:  Yes     [name] 01/31/2020, 8:39 AM

## 2020-01-31 NOTE — Anesthesia Postprocedure Evaluation (Signed)
Anesthesia Post Note  Patient: Fernando [name]  Procedure(s) Performed: ESOPHAGOGASTRODUODENOSCOPY (EGD) WITH PROPOFOL (N/A )     Patient location during evaluation: Endoscopy Anesthesia Type: MAC Level of consciousness: awake and alert Pain management: pain level controlled Vital Signs Assessment: post-procedure vital signs reviewed and stable Respiratory status: spontaneous breathing, nonlabored ventilation, respiratory function stable and patient connected to nasal cannula oxygen Cardiovascular status: stable and blood pressure returned to baseline Postop Assessment: no apparent nausea or vomiting Anesthetic complications: no   No complications documented.  Last Vitals:  Vitals:   01/31/20 1549 01/31/20 1956  BP: 114/75 114/67  Pulse: (!) 106   Resp: 18   Temp: 36.5 C 36.7 C  SpO2: 95%     Last Pain:  Vitals:   01/31/20 1956  TempSrc: Oral  PainSc:                  Anna-Marie Coller P Jhordan Kinter

## 2020-01-31 NOTE — Progress Notes (Signed)
   01/31/20 1122  Assess: MEWS Score  BP (!) 154/95  Pulse Rate (!) 101  ECG Heart Rate (!) 116  Resp 20  Level of Consciousness Alert  SpO2 94 %  O2 Device Nasal Cannula  O2 Flow Rate (L/min) 2 L/min  Assess: MEWS Score  MEWS Temp 0  MEWS Systolic 0  MEWS Pulse 2  MEWS RR 0  MEWS LOC 0  MEWS Score 2  MEWS Score Color Yellow  Assess: if the MEWS score is Yellow or Red  Were vital signs taken at a resting state? Yes  Focused Assessment No change from prior assessment  Early Detection of Sepsis Score *See Row Information* Low  MEWS guidelines implemented *See Row Information* No, previously red, continue vital signs every 4 hours  Treat  MEWS Interventions Administered scheduled meds/treatments  Pain Scale 0-10  Pain Score 0  Breathing 0  Complains of Other (Comment) (N/a )  Take Vital Signs  Increase Vital Sign Frequency  Yellow: Q 2hr X 2 then Q 4hr X 2, if remains yellow, continue Q 4hrs  Escalate  MEWS: Escalate Yellow: discuss with charge nurse/RN and consider discussing with provider and RRT  Notify: Charge Nurse/RN  Name of Charge Nurse/RN Notified Grace, RN  Date Charge Nurse/RN Notified 01/31/20  Time Charge Nurse/RN Notified 1156  Notify: Provider  Provider Name/Title  (N/a cronic)  Notify: Rapid Response  Name of Rapid Response RN Notified  (N/a chronic)  Document  Patient Outcome Stabilized after interventions (meds given after procedure )  MEWS score red at endoscopy during procedure. When back to the floor green and yellow. Med's are administered appropriately, no c/o or concerns. HR increased but BB administered. Will monitor VS appropriately.

## 2020-01-31 NOTE — Progress Notes (Signed)
PROGRESS NOTE    Fernando [name]  WER:[id number] DOB: 1956-01-30 DOA: 01/26/2020 PCP: [name], MD   Brief Narrative:  HPI on 01/26/2020 by [name] DONNIS PHANEUF is a 64 y.o. male with medical history significant of chronic systolic CHF (EF 19-50% in 10/2019);  ETOH dependence with DTs during 10/2019 admission; prosthetic shoulder joint infection; HTN; and HLD presenting after a fall.   He reports that he got up off the commode overnight, lost his balance, and hit his head with significant bleeding.  He had been drinking "a little bit" of alcohol, about as much as he usually drinks.  He reports that his R hip hurts but he is able to move it; he isn't sure he can bear weight on it.  His forehead is also sore.   Interim history  Patient admitted with fall, hypotension-likely secondary to alcohol.  Found to have sinus tachycardia.  Also noted to have anemia and received blood transfusion.  FOBT was positive.  Gastroenterology and cardiology consulted. EGD today.  Assessment & Plan   Acute respiratory failure with hypoxia likely secondary to aspiration pneumonia -In the setting of alcohol inebriation -Patient was not septic on admission -During hospitalization, patient was noted to have fevers and was started on empiric antibiotics -He has remained afebrile for several days now -Continue Unasyn -Culture data has remained negative to date  Alcohol dependence/acute metabolic encephalopathy -Admits to drinking 6 drinks per day and was noted to have an elevated alcohol level on admission 224 -Has had a previous history of delirium tremens with an admission to ICU this year -Continue thiamine, folate -initially was placed on Librium, however this was discontinued and he is now on CIWA protocol -Continue to monitor closely  Anemia/GI bleed -Hemoglobin dropped to 6.9-patient was given 1 unit PRBC -Hemoglobin today 9.7 (of note, hemoglobin range between 11-13 in July 2021) -FOBT  was positive -Patient admits to having dark stools for some time now -Patient did have a colonoscopy a couple of years ago -Gastroenterology consulted and appreciated -EGD showed esophagitis, portal hypertensive gastropathy.  No source of anemia or heme positive stool.  Per gastroenterology, patient is not currently in optimal condition to complete bowel preparation and undergo colonoscopy. -Continue to monitor CBC  Sinus tachycardia/Cardiomyopathy -Coreg was discontinued and patient was started on metoprolol -Echocardiogram shows an EF of 25 to 30%, LV severely decreased function, global hypokinesis.  LV diastolic parameters consistent with grade 1 diastolic dysfunction. (No change when compared to echocardiogram from 10/27/2019) -Currently 115 bpm -Question if this is due to anemia versus possible withdrawal -Cardiology consulted and appreciated-simply secondary to anemia.  No further intervention. -Patient states that he recently saw Dr. [name] as an outpatient  Essential hypertension with recurrent hypotension -In the setting of alcohol intoxication leading to hypovolemia with acute kidney injury on presentation -BP improved today, up to 158/84  Acute kidney injury -Suspect secondary to prerenal azotemia in the setting of alcohol abuse as well as medications -Patient was on losartan prior to admission -Creatinine was 1.4 on admission, currently down to 0.87 -Continue to monitor BMP  Chronic thrombocytopenia -Suspect secondary to bone marrow suppression from alcohol abuse -Platelets are trending upward-day 142, continue to monitor CBC  Fall -Prior to admission, occurred at home -Patient reported losing his balance (see was inebriated) -PT and OT recommending SNF placement  Mild hypovolemic hyponatremia -Resolved with IV fluids  Hypomagnesemia -Museum 1.5, will supplement and continue to monitor  Hypokalemia -Resolved with replacement, continue to  monitor  Shoulder  dislocation -Likely secondary to fall -patient had anterior dislocation which was reduced in the ED -Patient to see orthopedic surgery, Dr. Griffin Basil, as an outpatient  Tobacco dependence -Smoking cessation discussed  Unstable pressure ulcer -Present on admission -Continue wound care   DVT Prophylaxis  SCDs  Code Status: Full  Family Communication: None at bedside  Disposition Plan:  Status is: Inpatient  Remains inpatient appropriate because:Hemodynamically unstable, Ongoing diagnostic testing needed not appropriate for outpatient work up and Inpatient level of care appropriate due to severity of illness   Dispo: The patient is from: Home              Anticipated d/c is to: SNF vs Home w/ [facility]              Anticipated d/c date is: 2 days              Patient currently is not medically stable to d/c.   Consultants Cardiology Gastroenterology  Procedures  Echocardiogram EGD  Antibiotics   Anti-infectives (From admission, onward)   Start     Dose/Rate Route Frequency Ordered Stop   01/28/20 1000  [MAR Hold]  ampicillin-sulbactam (UNASYN) 1.5 g in sodium chloride 0.9 % 100 mL IVPB        (MAR Hold since Thu 01/31/2020 at 0939.Hold Reason: Transfer to a Procedural area.)   1.5 g 200 mL/hr over 30 Minutes Intravenous Every 6 hours 01/28/20 0854        Subjective:   Fernando [name] seen and examined today.  Patient with no complaints this morning.  Wondering when his EGD will be done.  Denies current chest pain or shortness of breath, abdominal pain, nausea or vomiting, headache or dizziness.    Objective:   Vitals:   01/31/20 0800 01/31/20 0945 01/31/20 0950 01/31/20 1040  BP: 109/74 137/78  135/75  Pulse:  (!) 113 (!) 108 (!) 112  Resp: 16 (!) 24 19 20   Temp: 98.2 F (36.8 C) 97.8 F (36.6 C)  98.1 F (36.7 C)  TempSrc: Oral Oral  Temporal  SpO2: 94% 92% (!) 88% 98%  Weight:      Height:        Intake/Output Summary (Last 24 hours) at 01/31/2020 1045 Last data  filed at 01/31/2020 1034 Gross per 24 hour  Intake 826.2 ml  Output 1050 ml  Net -223.8 ml   Filed Weights   01/29/20 0200 01/30/20 0022 01/31/20 0000  Weight: 76.7 kg 76.2 kg 74.8 kg   Exam  General: Well developed, chronically ill-appearing, NAD  HEENT: [place], facial bruising along with sutures noted above the right eyebrow, mucous membranes moist.   Cardiovascular: S1 S2 auscultated, tachycardic  Respiratory: Diminished breath sounds, occasional cough  Abdomen: Soft, nontender, nondistended, + bowel sounds  Extremities: warm dry without cyanosis clubbing or edema  Neuro: AAOx3, nonfocal, mild tremor in the left upper extremity  Psych: does not, appropriate mood and affect  Data Reviewed: I have personally reviewed following labs and imaging studies  CBC: Recent Labs  Lab 01/26/20 1122 01/26/20 1122 01/27/20 0442 01/28/20 1109 01/29/20 0756 01/30/20 0509 01/31/20 0302  WBC 6.6  --  4.1 4.8  --  6.2 6.0  NEUTROABS  --   --   --  3.6  --   --   --   HGB 9.1*   < > 8.3* 6.9* 10.5* 9.9* 9.7*  HCT 27.3*   < > 23.8* 20.4* 31.1* 29.5* 29.7*  MCV 108.3*  --  106.7* 108.5*  --  103.9* 105.7*  PLT 52*  --  39* 63*  --  108* 142*   < > = values in this interval not displayed.   Basic Metabolic Panel: Recent Labs  Lab 01/26/20 0227 01/27/20 0441 01/27/20 0442 01/28/20 1109 01/29/20 0249 01/30/20 0509 01/31/20 0302  NA   < >  --  131* 134* 137 137 136  K   < >  --  2.8* 3.3* 3.6 4.2 4.1  CL   < >  --  93* 99 98 100 99  CO2   < >  --  23 23 30 28 27   GLUCOSE   < >  --  107* 103* 110* 102* 112*  BUN   < >  --  9 6* 8 11 12   CREATININE   < >  --  0.94 0.88 0.78 0.81 0.87  CALCIUM   < >  --  8.3* 7.9* 8.2* 8.3* 8.9  MG  --  0.9*  --  2.2  --   --  1.5*  PHOS  --   --   --  1.3*  --   --   --    < > = values in this interval not displayed.   GFR: Estimated Creatinine Clearance: 78.1 mL/min (by C-G formula based on SCr of 0.87 mg/dL). Liver Function Tests: Recent  Labs  Lab 01/26/20 0223 01/28/20 1109  AST 149*  --   ALT 75*  --   ALKPHOS 104  --   BILITOT 1.8*  --   PROT 5.6*  --   ALBUMIN 2.9* 2.5*   No results for input(s): LIPASE, AMYLASE in the last 168 hours. No results for input(s): AMMONIA in the last 168 hours. Coagulation Profile: Recent Labs  Lab 01/26/20 0223  INR 1.1   Cardiac Enzymes: No results for input(s): CKTOTAL, CKMB, CKMBINDEX, TROPONINI in the last 168 hours. BNP (last 3 results) No results for input(s): PROBNP in the last 8760 hours. HbA1C: No results for input(s): HGBA1C in the last 72 hours. CBG: No results for input(s): GLUCAP in the last 168 hours. Lipid Profile: No results for input(s): CHOL, HDL, LDLCALC, TRIG, CHOLHDL, LDLDIRECT in the last 72 hours. Thyroid Function Tests: No results for input(s): TSH, T4TOTAL, FREET4, T3FREE, THYROIDAB in the last 72 hours. Anemia Panel: No results for input(s): VITAMINB12, FOLATE, FERRITIN, TIBC, IRON, RETICCTPCT in the last 72 hours. Urine analysis: No results found for: COLORURINE, APPEARANCEUR, LABSPEC, PHURINE, GLUCOSEU, HGBUR, BILIRUBINUR, KETONESUR, PROTEINUR, UROBILINOGEN, NITRITE, LEUKOCYTESUR Sepsis Labs: @LABRCNTIP (procalcitonin:4,lacticidven:4)  ) Recent Results (from the past 240 hour(s))  Respiratory Panel by RT PCR (Flu A&B, Covid) - Nasopharyngeal Swab     Status: None   Collection Time: 01/26/20  2:27 AM   Specimen: Nasopharyngeal Swab  Result Value Ref Range Status   SARS Coronavirus 2 by RT PCR NEGATIVE NEGATIVE Final    Comment: (NOTE) SARS-CoV-2 target nucleic acids are NOT DETECTED.  The SARS-CoV-2 RNA is generally detectable in upper respiratoy specimens during the acute phase of infection. The lowest concentration of SARS-CoV-2 viral copies this assay can detect is 131 copies/mL. A negative result does not preclude SARS-Cov-2 infection and should not be used as the sole basis for treatment or other patient management decisions. A  negative result may occur with  improper specimen collection/handling, submission of specimen other than nasopharyngeal swab, presence of viral mutation(s) within the areas targeted by this assay, and inadequate number of viral copies (<131 copies/mL). A negative  result must be combined with clinical observations, patient history, and epidemiological information. The expected result is Negative.  Fact Sheet for Patients:  PinkCheek.be  Fact Sheet for Healthcare Providers:  GravelBags.it  This test is no t yet approved or cleared by the [place] FDA and  has been authorized for detection and/or diagnosis of SARS-CoV-2 by FDA under an Emergency Use Authorization (EUA). This EUA will remain  in effect (meaning this test can be used) for the duration of the COVID-19 declaration under Section 564(b)(1) of the Act, 21 U.S.C. section 360bbb-3(b)(1), unless the authorization is terminated or revoked sooner.     Influenza A by PCR NEGATIVE NEGATIVE Final   Influenza B by PCR NEGATIVE NEGATIVE Final    Comment: (NOTE) The Xpert Xpress SARS-CoV-2/FLU/RSV assay is intended as an aid in  the diagnosis of influenza from Nasopharyngeal swab specimens and  should not be used as a sole basis for treatment. Nasal washings and  aspirates are unacceptable for Xpert Xpress SARS-CoV-2/FLU/RSV  testing.  Fact Sheet for Patients: PinkCheek.be  Fact Sheet for Healthcare Providers: GravelBags.it  This test is not yet approved or cleared by the [place] FDA and  has been authorized for detection and/or diagnosis of SARS-CoV-2 by  FDA under an Emergency Use Authorization (EUA). This EUA will remain  in effect (meaning this test can be used) for the duration of the  Covid-19 declaration under Section 564(b)(1) of the Act, 21  U.S.C. section 360bbb-3(b)(1), unless the authorization  is  terminated or revoked. Performed at [facility], [place] [address] [place], [place] [address]       Radiology Studies: ECHOCARDIOGRAM COMPLETE  Result Date: 01/29/2020    ECHOCARDIOGRAM REPORT   Patient Name:   KOLBY SCHARA Date of Exam: 01/29/2020 Medical Rec #:  [id number]      Height:       67.0 in Accession #:    [id number]     Weight:       169.0 lb Date of Birth:  [birth year]      BSA:          1.883 m Patient Age:    71 years       BP:           80/56 mmHg Patient Gender: M              HR:           109 bpm. Exam Location:  Inpatient Procedure: 2D Echo, Color Doppler, Cardiac Doppler and Intracardiac            Opacification Agent Indications:    Dyspnea 786.09 / R06.00  History:        Patient has no prior history of Echocardiogram examinations.                 CHF; Risk Factors:Hypertension, Dyslipidemia and ETOH.  Sonographer:    Bernadene Person RDCS Referring Phys: [place]  1. Cannot exclude apicoseptal mural thrombus. Left ventricular ejection fraction, by estimation, is 25 to 30%. The left ventricle has severely decreased function. The left ventricle demonstrates global hypokinesis. Left ventricular diastolic parameters are consistent with Grade I diastolic dysfunction (impaired relaxation).  2. Right ventricular systolic function is mildly reduced. The right ventricular size is moderately enlarged. Tricuspid regurgitation signal is inadequate for assessing PA pressure.  3. The mitral valve is normal in structure. No evidence of mitral valve regurgitation.  4. The aortic valve is normal  in structure. Aortic valve regurgitation is not visualized. No aortic stenosis is present.  5. Aortic dilatation noted. There is mild dilatation of the aortic root, measuring 39 mm. Comparison(s): No prior Echocardiogram. Conclusion(s)/Recommendation(s): Unable to exclude left ventricular thrombus, would recommend a repeat transthoracic echocardiogram with contrast.  Repeat limited imaging with Definity contrast to exclude left ventricular apical thrombus. FINDINGS  Left Ventricle: Cannot exclude apicoseptal mural thrombus. Left ventricular ejection fraction, by estimation, is 25 to 30%. The left ventricle has severely decreased function. The left ventricle demonstrates global hypokinesis. Definity contrast agent was given IV to delineate the left ventricular endocardial borders. The left ventricular internal cavity size was normal in size. There is no left ventricular hypertrophy. Left ventricular diastolic parameters are consistent with Grade I diastolic dysfunction (impaired relaxation). Normal left ventricular filling pressure. Right Ventricle: The right ventricular size is moderately enlarged. No increase in right ventricular wall thickness. Right ventricular systolic function is mildly reduced. Tricuspid regurgitation signal is inadequate for assessing PA pressure. Left Atrium: Left atrial size was normal in size. Right Atrium: Right atrial size was normal in size. Pericardium: There is no evidence of pericardial effusion. Mitral Valve: The mitral valve is normal in structure. No evidence of mitral valve regurgitation. Tricuspid Valve: The tricuspid valve is normal in structure. Tricuspid valve regurgitation is trivial. Aortic Valve: The aortic valve is normal in structure. Aortic valve regurgitation is not visualized. No aortic stenosis is present. Pulmonic Valve: The pulmonic valve was not well visualized. Pulmonic valve regurgitation is not visualized. Aorta: Aortic dilatation noted. There is mild dilatation of the aortic root, measuring 39 mm. IAS/Shunts: No atrial level shunt detected by color flow Doppler.  LEFT VENTRICLE PLAX 2D LVIDd:         4.40 cm      Diastology LVIDs:         4.00 cm      LV e' medial:    4.46 cm/s LV PW:         1.00 cm      LV E/e' medial:  9.7 LV IVS:        1.00 cm      LV e' lateral:   4.51 cm/s LVOT diam:     2.30 cm      LV E/e'  lateral: 9.6 LV SV:         50 LV SV Index:   26 LVOT Area:     4.15 cm  LV Volumes (MOD) LV vol d, MOD A4C: 105.0 ml LV vol s, MOD A4C: 58.8 ml LV SV MOD A4C:     105.0 ml RIGHT VENTRICLE RV S prime:     11.40 cm/s TAPSE (M-mode): 1.5 cm LEFT ATRIUM             Index       RIGHT ATRIUM           Index LA diam:        2.90 cm 1.54 cm/m  RA Area:     12.90 cm LA Vol (A2C):   56.1 ml 29.80 ml/m RA Volume:   31.60 ml  16.79 ml/m LA Vol (A4C):   30.0 ml 15.94 ml/m LA Biplane Vol: 42.4 ml 22.52 ml/m  AORTIC VALVE LVOT Vmax:   84.10 cm/s LVOT Vmean:  56.300 cm/s LVOT VTI:    0.120 m  AORTA Ao Root diam: 3.90 cm Ao Asc diam:  3.10 cm MITRAL VALVE MV Area (PHT): 1.97 cm    SHUNTS MV  Decel Time: 386 msec    Systemic VTI:  0.12 m MV E velocity: 43.20 cm/s  Systemic Diam: 2.30 cm MV A velocity: 83.40 cm/s MV E/A ratio:  0.52 Mihai Croitoru MD Electronically signed by Sanda Klein MD Signature Date/Time: 01/29/2020/5:23:49 PM    Final      Scheduled Meds: . [MAR Hold] carvedilol  3.125 mg Oral BID WC  . [MAR Hold] docusate sodium  100 mg Oral BID  . [MAR Hold] feeding supplement  237 mL Oral TID BM  . [MAR Hold] folic acid  1 mg Oral Daily  . [MAR Hold] multivitamin with minerals  1 tablet Oral Daily  . [MAR Hold] nicotine  14 mg Transdermal Daily  . [MAR Hold] pantoprazole  40 mg Oral BID  . [MAR Hold] sodium chloride flush  3 mL Intravenous Q12H  . [MAR Hold] thiamine  100 mg Oral Daily   Or  . [MAR Hold] thiamine  100 mg Intravenous Daily   Continuous Infusions: . [MAR Hold] sodium chloride Stopped (01/29/20 1418)  . sodium chloride 20 mL/hr at 01/31/20 0529  . [MAR Hold] ampicillin-sulbactam (UNASYN) IV Stopped (01/31/20 0427)     LOS: 4 days   Time Spent in minutes   45 minutes  Lelia Jons D.O. on 01/31/2020 at 10:45 AM  Between 7am to 7pm - Please see pager noted on [email or web address]  After 7pm go to [email or web address]  And look for the night coverage person covering for me after  hours  [facility] Office  [phone]

## 2020-01-31 NOTE — Transfer of Care (Signed)
Immediate Anesthesia Transfer of Care Note  Patient: Fernando [name]  Procedure(s) Performed: ESOPHAGOGASTRODUODENOSCOPY (EGD) WITH PROPOFOL (N/A )  Patient Location: PACU  Anesthesia Type:MAC  Level of Consciousness: patient cooperative and responds to stimulation  Airway & Oxygen Therapy: Patient Spontanous Breathing and Patient connected to nasal cannula oxygen  Post-op Assessment: Report given to RN and Post -op Vital signs reviewed and stable  Post vital signs: Reviewed and stable  Last Vitals:  Vitals Value Taken Time  BP 135/75 01/31/20 1038  Temp    Pulse 112 01/31/20 1040  Resp 20 01/31/20 1040  SpO2 98 % 01/31/20 1040  Vitals shown include unvalidated device data.  Last Pain:  Vitals:   01/31/20 1040  TempSrc: (P) Temporal  PainSc:       Patients Stated Pain Goal: 2 (61/51/83 4373)  Complications: No complications documented.

## 2020-01-31 NOTE — Interval H&P Note (Signed)
History and Physical Interval Note:  01/31/2020 9:56 AM  Fernando [name]  has presented today for surgery, with the diagnosis of melena, upper GI bleed.  The various methods of treatment have been discussed with the patient and family. After consideration of risks, benefits and other options for treatment, the patient has consented to  Procedure(s): ESOPHAGOGASTRODUODENOSCOPY (EGD) WITH PROPOFOL (N/A) as a surgical intervention.  The patient's history has been reviewed, patient examined, no change in status, stable for surgery.  I have reviewed the patient's chart and labs.  Questions were answered to the patient's satisfaction.    Interviewed and examined patient in pre-procedure area.  Reviewed Dr. Celesta Aver consult note. Hgb 9.7 and platelets 142 today   [name]

## 2020-01-31 NOTE — Anesthesia Preprocedure Evaluation (Addendum)
Anesthesia Evaluation  Patient identified by MRN, date of birth, ID band Patient awake    Reviewed: Allergy & Precautions, NPO status , Patient's Chart, lab work & pertinent test results  Airway Mallampati: III  TM Distance: >3 FB Neck ROM: Full    Dental no notable dental hx.    Pulmonary Current Smoker and Patient abstained from smoking.,    Pulmonary exam normal breath sounds clear to auscultation       Cardiovascular hypertension, Pt. on home beta blockers and Pt. on medications +CHF   Rhythm:Regular Rate:Tachycardia  ECHO: 1. Cannot exclude apicoseptal mural thrombus. Left ventricular ejection fraction, by estimation, is 25 to 30%. The left ventricle has severely decreased function. The left ventricle demonstrates global hypokinesis. Left ventricular diastolic parameters are consistent with Grade I diastolic dysfunction (impaired relaxation). 2. Right ventricular systolic function is mildly reduced. The right ventricular size is moderately enlarged. Tricuspid regurgitation signal is inadequate for assessing PA pressure. 3. The mitral valve is normal in structure. No evidence of mitral valve regurgitation. 4. The aortic valve is normal in structure. Aortic valve regurgitation is not visualized. No aortic stenosis is present. 5. Aortic dilatation noted. There is mild dilatation of the aortic root, measuring 39 mm.   Neuro/Psych PSYCHIATRIC DISORDERS negative neurological ROS     GI/Hepatic negative GI ROS, (+)     substance abuse  ,   Endo/Other  negative endocrine ROS  Renal/GU negative Renal ROS     Musculoskeletal negative musculoskeletal ROS (+)   Abdominal   Peds  Hematology negative hematology ROS (+) anemia , Dyslipidemia   Anesthesia Other Findings melena, upper GI bleed  Reproductive/Obstetrics                            Anesthesia Physical Anesthesia Plan  ASA:  IV  Anesthesia Plan: MAC   Post-op Pain Management:    Induction: Intravenous  PONV Risk Score and Plan: 0 and Propofol infusion and Treatment may vary due to age or medical condition  Airway Management Planned: Nasal Cannula  Additional Equipment:   Intra-op Plan:   Post-operative Plan:   Informed Consent: I have reviewed the patients History and Physical, chart, labs and discussed the procedure including the risks, benefits and alternatives for the proposed anesthesia with the patient or authorized representative who has indicated his/her understanding and acceptance.       Plan Discussed with: CRNA  Anesthesia Plan Comments:         Anesthesia Quick Evaluation

## 2020-02-01 DIAGNOSIS — K8689 Other specified diseases of pancreas: Secondary | ICD-10-CM | POA: Diagnosis not present

## 2020-02-01 DIAGNOSIS — K766 Portal hypertension: Secondary | ICD-10-CM

## 2020-02-01 DIAGNOSIS — K3189 Other diseases of stomach and duodenum: Secondary | ICD-10-CM | POA: Diagnosis not present

## 2020-02-01 DIAGNOSIS — K7010 Alcoholic hepatitis without ascites: Secondary | ICD-10-CM

## 2020-02-01 DIAGNOSIS — N179 Acute kidney failure, unspecified: Secondary | ICD-10-CM | POA: Diagnosis not present

## 2020-02-01 DIAGNOSIS — K709 Alcoholic liver disease, unspecified: Secondary | ICD-10-CM

## 2020-02-01 DIAGNOSIS — F1029 Alcohol dependence with unspecified alcohol-induced disorder: Secondary | ICD-10-CM | POA: Diagnosis not present

## 2020-02-01 DIAGNOSIS — I5022 Chronic systolic (congestive) heart failure: Secondary | ICD-10-CM | POA: Diagnosis not present

## 2020-02-01 DIAGNOSIS — W19XXXA Unspecified fall, initial encounter: Secondary | ICD-10-CM | POA: Diagnosis not present

## 2020-02-01 LAB — CBC
HCT: 29.7 % — ABNORMAL LOW (ref 39.0–52.0)
Hemoglobin: 9.6 g/dL — ABNORMAL LOW (ref 13.0–17.0)
MCH: 34.3 pg — ABNORMAL HIGH (ref 26.0–34.0)
MCHC: 32.3 g/dL (ref 30.0–36.0)
MCV: 106.1 fL — ABNORMAL HIGH (ref 80.0–100.0)
Platelets: 169 10*3/uL (ref 150–400)
RBC: 2.8 MIL/uL — ABNORMAL LOW (ref 4.22–5.81)
RDW: 16.5 % — ABNORMAL HIGH (ref 11.5–15.5)
WBC: 5.1 10*3/uL (ref 4.0–10.5)
nRBC: 0 % (ref 0.0–0.2)

## 2020-02-01 LAB — BASIC METABOLIC PANEL
Anion gap: 10 (ref 5–15)
BUN: 6 mg/dL — ABNORMAL LOW (ref 8–23)
CO2: 27 mmol/L (ref 22–32)
Calcium: 8.7 mg/dL — ABNORMAL LOW (ref 8.9–10.3)
Chloride: 98 mmol/L (ref 98–111)
Creatinine, Ser: 0.78 mg/dL (ref 0.61–1.24)
GFR, Estimated: >60 mL/min (ref >60)
Glucose, Bld: 107 mg/dL — ABNORMAL HIGH (ref 70–99)
Potassium: 3.8 mmol/L (ref 3.5–5.1)
Sodium: 135 mmol/L (ref 135–145)

## 2020-02-01 LAB — MAGNESIUM: Magnesium: 1.7 mg/dL (ref 1.7–2.4)

## 2020-02-01 MED ORDER — MAGNESIUM SULFATE 2 GM/50ML IV SOLN
2.0000 g | Freq: Once | INTRAVENOUS | Status: AC
  Administered 2020-02-01: 2 g via INTRAVENOUS
  Filled 2020-02-01: qty 50

## 2020-02-01 NOTE — Progress Notes (Addendum)
Physical Therapy Treatment Patient Details Name: Fernando [name] MRN: [id number] DOB: 14-Oct-1955 Today's Date: 02/01/2020    History of Present Illness Pt is a 64 y.o. M with significant PMH of CHF with reduced EF, hypertension and chronic alcohol use. Presents after a fall. Found to have right shoulder dislocation and subsequently reduced. 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     PT Comments    Patient progressing well towards PT goals. Improved ambulation distance with Min guard assist and use of RW for support requiring 2 standing rest breaks due to 2/4 DOE and SP02 dropping to 82% on RA. Cues for pursed lip breathing provided. RN aware and donned 02 post walk. Pt with slow processing and poor safety awareness. Pt lives alone and is refusing SNF. If pt continues to refuse SNF, recommend Home with HHPT and supervision, if able. Will likely need 02 pending discharge. Encouraged increasing activity and walking 2-3 more times today with 02 donned. Discussed with RN. Will follow.    Follow Up Recommendations  SNF;Supervision for mobility/OOB (if continues to refuse SNF, HHPT)     Equipment Recommendations  None recommended by PT    Recommendations for Other Services       Precautions / Restrictions Precautions Precautions: Fall;Other (comment) Precaution Comments: watch 02/HR Required Braces or Orthoses: Sling (does not use) Restrictions Weight Bearing Restrictions: No Other Position/Activity Restrictions: patient continues to decline sling.    Mobility  Bed Mobility Overal bed mobility: Needs Assistance Bed Mobility: Supine to Sit     Supine to sit: Supervision;HOB elevated     General bed mobility comments: Able to get to EOB without assist.  Transfers Overall transfer level: Needs assistance Equipment used: Rolling walker (2 wheeled) Transfers: Sit to/from Stand Sit to Stand: Min assist         General transfer comment: Assist to power to standing with  cues for hand placement and technique; impulsive to rise. Transferred to chair post ambulation.  Ambulation/Gait Ambulation/Gait assistance: Min guard Gait Distance (Feet): 100 Feet Assistive device: Rolling walker (2 wheeled) Gait Pattern/deviations: Step-through pattern;Decreased stride length Gait velocity: decreased   General Gait Details: Slow, mildly unsteady gait with 2 standing rest breaks with 2/4 DOE. Sp02 dropped to 82% on RA, cues for pursed lip breathing. Donned 02 post session and Sp02 in mid 90s.,   Stairs             Wheelchair Mobility    Modified Rankin (Stroke Patients Only)       Balance Overall balance assessment: Needs assistance Sitting-balance support: Feet supported;No upper extremity supported Sitting balance-Leahy Scale: Fair     Standing balance support: During functional activity Standing balance-Leahy Scale: Poor Standing balance comment: Requires UE support in standing.                            Cognition Arousal/Alertness: Awake/alert Behavior During Therapy: Flat affect Overall Cognitive Status: Impaired/Different from baseline Area of Impairment: Memory;Following commands;Safety/judgement;Problem solving                     Memory: Decreased short-term memory;Decreased recall of precautions Following Commands: Follows one step commands with increased time Safety/Judgement: Decreased awareness of safety;Decreased awareness of deficits   Problem Solving: Slow processing;Requires verbal cues General Comments: Pt slow to process and respond to questions. "I am thinking," and then never answers question. Poor awareness of safety/deficits. Pt had 02 out of nose  upon arrival and not able to give any details regarding when it was taken off etc.      Exercises      General Comments General comments (skin integrity, edema, etc.): HR up to 128 bpm and SP02 dropped to 82% on RA during activity.      Pertinent  Vitals/Pain Pain Assessment: No/denies pain    Home Living                      Prior Function            PT Goals (current goals can now be found in the care plan section) Progress towards PT goals: Progressing toward goals    Frequency    Min 3X/week      PT Plan Current plan remains appropriate;Frequency needs to be updated    Co-evaluation              AM-PAC PT "6 Clicks" Mobility   Outcome Measure  Help needed turning from your back to your side while in a flat bed without using bedrails?: A Little Help needed moving from lying on your back to sitting on the side of a flat bed without using bedrails?: A Little Help needed moving to and from a bed to a chair (including a wheelchair)?: A Little Help needed standing up from a chair using your arms (e.g., wheelchair or bedside chair)?: A Little Help needed to walk in hospital room?: A Little Help needed climbing 3-5 steps with a railing? : A Lot 6 Click Score: 17    End of Session Equipment Utilized During Treatment: Gait belt Activity Tolerance: Treatment limited secondary to medical complications (Comment) (drop in Sp02) Patient left: in chair;with call bell/phone within reach;with chair alarm set Nurse Communication: Mobility status PT Visit Diagnosis: Unsteadiness on feet (R26.81);Other abnormalities of gait and mobility (R26.89);History of falling (Z91.81);Difficulty in walking, not elsewhere classified (R26.2)     Time: 1100-1115 PT Time Calculation (min) (ACUTE ONLY): 15 min  Charges:  $Gait Training: 8-22 mins                     Marisa Severin, PT, DPT Acute Rehabilitation Services Pager [phone] Office [place] 02/01/2020, 11:55 AM

## 2020-02-01 NOTE — Progress Notes (Signed)
Physical Therapy Treatment Patient Details Name: Fernando [name] MRN: [id number] DOB: 10/27/1955 Today's Date: 02/01/2020    History of Present Illness Pt is a 64 y.o. M with significant PMH of CHF with reduced EF, hypertension and chronic alcohol use. Presents after a fall. Found to have right shoulder dislocation and subsequently reduced. 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     PT Comments    Patient found standing on opposite side of bed where lines/telemetry/foley catheter were located and tangled in sheets upon PT arrival. Assisted pt get de-tangled and ambulate to other side of bed with Min A for balance and holding onto bed rail for support. Patient noted to be confused. Initially when asked where he was going, he stated "Going out to smoke a quick one." However when asked again later states he has no idea. Oriented x3. Pt with poor awareness of safety/deficits and continues to be a fall risk. Pt is not safe to be home alone. Continue to recommend SNF due to weakness, impaired balance, poor awareness of safety/deficits and confusion. Will follow.   Follow Up Recommendations  SNF;Supervision for mobility/OOB     Equipment Recommendations  None recommended by PT    Recommendations for Other Services       Precautions / Restrictions Precautions Precautions: Fall;Other (comment) Precaution Comments: watch 02/HR Required Braces or Orthoses: Sling Restrictions Weight Bearing Restrictions: No RUE Weight Bearing: Weight bearing as tolerated Other Position/Activity Restrictions: patient continues to decline sling.    Mobility  Bed Mobility Overal bed mobility: Needs Assistance Bed Mobility: Supine to Sit     Supine to sit: Supervision;HOB elevated     General bed mobility comments: Seen standing at bedside with IV/telemetry and foley on opposite side of bed and tangled in sheets upon PT arrival.  Transfers Overall transfer level: Needs assistance Equipment used:  None Transfers: Sit to/from [facility] Sit to Stand: Min assist;Min guard Stand pivot transfers: Min assist       General transfer comment: Returned to sitting on EOB with assist for balance, holding onto rail and to manage lines. Transferred from bed to chair with Min A for support/balance/line management.  Ambulation/Gait Ambulation/Gait assistance: Min assist Gait Distance (Feet): 10 Feet Assistive device: None Gait Pattern/deviations: Shuffle;Narrow base of support Gait velocity: decreased   General Gait Details: Assisted pt to walk around bed holding onto bedrail after detangling him from sheets and maintaining lines. Pulled off 02 as well. Very unsteady, Min A needed.   Stairs             Wheelchair Mobility    Modified Rankin (Stroke Patients Only)       Balance Overall balance assessment: Needs assistance Sitting-balance support: Feet supported;No upper extremity supported Sitting balance-Leahy Scale: Good     Standing balance support: During functional activity Standing balance-Leahy Scale: Poor Standing balance comment: Requires UE support in standing.                            Cognition Arousal/Alertness: Awake/alert Behavior During Therapy: Flat affect Overall Cognitive Status: Impaired/Different from baseline Area of Impairment: Awareness;Safety/judgement;Memory                     Memory: Decreased recall of precautions Following Commands: Follows one step commands with increased time Safety/Judgement: Decreased awareness of safety;Decreased awareness of deficits Awareness: Intellectual Problem Solving: Slow processing;Requires verbal cues;Requires tactile cues General Comments: Pt found  climbing OOB between bed rails on opposite side of lines/telemetry and catheter bag tangled in bed sheets as PT was walking by. When asked where he was going, "I'm going out to smoke a quick one." When asked later once sitting  on EOB, "I am not sure why." Pt is confused.      Exercises      General Comments General comments (skin integrity, edema, etc.): Vital signs not observed as telemetry was pulled off. Donned 02 post session; pt with pressure sore behind left ear so placed foam pad around tubing for comfort.      Pertinent Vitals/Pain Pain Assessment: Faces Faces Pain Scale: No hurt    Home Living                      Prior Function            PT Goals (current goals can now be found in the care plan section) Progress towards PT goals: Progressing toward goals (slowly)    Frequency    Min 3X/week      PT Plan Current plan remains appropriate    Co-evaluation              AM-PAC PT "6 Clicks" Mobility   Outcome Measure  Help needed turning from your back to your side while in a flat bed without using bedrails?: None Help needed moving from lying on your back to sitting on the side of a flat bed without using bedrails?: None Help needed moving to and from a bed to a chair (including a wheelchair)?: A Little Help needed standing up from a chair using your arms (e.g., wheelchair or bedside chair)?: A Little Help needed to walk in hospital room?: A Little Help needed climbing 3-5 steps with a railing? : A Lot 6 Click Score: 19    End of Session Equipment Utilized During Treatment: Gait belt Activity Tolerance: Patient tolerated treatment well Patient left: in chair;with call bell/phone within reach;with chair alarm set Nurse Communication: Mobility status PT Visit Diagnosis: Unsteadiness on feet (R26.81);Other abnormalities of gait and mobility (R26.89);History of falling (Z91.81);Difficulty in walking, not elsewhere classified (R26.2)     Time: 8588-5027 PT Time Calculation (min) (ACUTE ONLY): 18 min  Charges:  $Gait Training: 8-22 mins $Therapeutic Activity: 8-22 mins                     Marisa Severin, PT, DPT Acute Rehabilitation Services Pager  [phone] Office [place] 02/01/2020, 3:19 PM

## 2020-02-01 NOTE — Progress Notes (Signed)
PROGRESS NOTE    Fernando [name]  OIZ:[id number] DOB: 1956/03/18 DOA: 01/26/2020 PCP: [name], MD   Brief Narrative:  HPI on 01/26/2020 by [name] KAYHAN [name] is a 64 y.o. male with medical history significant of chronic systolic CHF (EF 33-82% in 10/2019);  ETOH dependence with DTs during 10/2019 admission; prosthetic shoulder joint infection; HTN; and HLD presenting after a fall.   He reports that he got up off the commode overnight, lost his balance, and hit his head with significant bleeding.  He had been drinking "a little bit" of alcohol, about as much as he usually drinks.  He reports that his R hip hurts but he is able to move it; he isn't sure he can bear weight on it.  His forehead is also sore.   Interim history  Patient admitted with fall, hypotension-likely secondary to alcohol.  Found to have sinus tachycardia.  Also noted to have anemia and received blood transfusion.  FOBT was positive.  Gastroenterology and cardiology consulted. S/p EGD. Assessment & Plan   Acute respiratory failure with hypoxia likely secondary to aspiration pneumonia -In the setting of alcohol inebriation -Patient was not septic on admission -During hospitalization, patient was noted to have fevers and was started on empiric antibiotics -He has remained afebrile for several days now -Continue Unasyn -Culture data has remained negative to date  Alcohol dependence/acute metabolic encephalopathy -Admits to drinking 6 drinks per day and was noted to have an elevated alcohol level on admission 224 -Has had a previous history of delirium tremens with an admission to ICU this year -Continue thiamine, folate -initially was placed on Librium, however this was discontinued and he is now on CIWA protocol -Continue to monitor closely  Anemia/GI bleed -Hemoglobin dropped to 6.9-patient was given 1 unit PRBC -Hemoglobin today 9.6 (of note, hemoglobin range between 11-13 in July 2021) -FOBT  was positive -Patient admits to having dark stools for some time now -Patient did have a colonoscopy a couple of years ago -Gastroenterology consulted and appreciated -EGD showed esophagitis, portal hypertensive gastropathy.  No source of anemia or heme positive stool.  Per gastroenterology, patient is not currently in optimal condition to complete bowel preparation and undergo colonoscopy. -Continue to monitor CBC -Pending further recommendations from gastroenterology  Sinus tachycardia/Cardiomyopathy -Coreg was discontinued and patient was started on metoprolol -Echocardiogram shows an EF of 25 to 30%, LV severely decreased function, global hypokinesis.  LV diastolic parameters consistent with grade 1 diastolic dysfunction. (No change when compared to echocardiogram from 10/27/2019) -Currently 115 bpm -Question if this is due to anemia versus possible withdrawal -Cardiology consulted and appreciated-likely secondary to anemia.  No further intervention. -Patient states that he recently saw Dr. Harrell [name] as an outpatient  Essential hypertension with recurrent hypotension -In the setting of alcohol intoxication leading to hypovolemia with acute kidney injury on presentation -BP improved today, up to 158/84  Acute kidney injury -Suspect secondary to prerenal azotemia in the setting of alcohol abuse as well as medications -Patient was on losartan prior to admission -Creatinine was 1.4 on admission, currently down to 0.87 -Continue to monitor BMP  Chronic thrombocytopenia -Suspect secondary to bone marrow suppression from alcohol abuse -Platelets are trending upward-day 142, continue to monitor CBC  Fall -Prior to admission, occurred at home -Patient reported losing his balance (see was inebriated) -PT and OT recommending SNF placement -Discussed SNF placement with patient, patient refused and would like to go home with home health  Mild hypovolemic hyponatremia -Resolved  with IV  fluids  Hypomagnesemia -Magnesium 1.7 after supplementation -Will continue to supplement and monitor  Hypokalemia -Resolved with replacement, continue to monitor  Shoulder dislocation -Likely secondary to fall -patient had anterior dislocation which was reduced in the ED -Patient to see orthopedic surgery, Dr. Griffin Basil, as an outpatient  Tobacco dependence -Smoking cessation discussed  Unstable pressure ulcer -Present on admission -Continue wound care  Pancreatic mass -Noted on CT abdomen pelvis: Solid and cystic mass measuring 3.4 cm at the tail, projecting superiorly.  Mild adjacent fat stranding and no distinguishable plane between the mass in the stomach -Gastroenterology consulted, may need MRI  DVT Prophylaxis  SCDs  Code Status: Full  Family Communication: None at bedside  Disposition Plan:  Status is: Inpatient  Remains inpatient appropriate because:Hemodynamically unstable, Ongoing diagnostic testing needed not appropriate for outpatient work up and Inpatient level of care appropriate due to severity of illness. Pending further recommendations from gastroenterology   Dispo: The patient is from: Home              Anticipated d/c is to: SNF vs Home w/ [facility]              Anticipated d/c date is: 2 days              Patient currently is not medically stable to d/c.   Consultants Cardiology Gastroenterology  Procedures  Echocardiogram EGD  Antibiotics   Anti-infectives (From admission, onward)   Start     Dose/Rate Route Frequency Ordered Stop   01/28/20 1000  ampicillin-sulbactam (UNASYN) 1.5 g in sodium chloride 0.9 % 100 mL IVPB        1.5 g 200 mL/hr over 30 Minutes Intravenous Every 6 hours 01/28/20 0854        Subjective:   [name] seen and examined today.  Patient complains of his IV site this morning.  Denies current chest pain or shortness of breath, abdominal pain, nausea or vomiting, diarrhea constipation, dizziness or headache.     Objective:   Vitals:   01/31/20 2355 02/01/20 0350 02/01/20 0929 02/01/20 1156  BP:   103/75   Pulse:  (!) 109 (!) 118 100  Resp:   18   Temp:   97.7 F (36.5 C)   TempSrc:   Oral   SpO2: (!) 89% 98% 98% 94%  Weight:      Height:        Intake/Output Summary (Last 24 hours) at 02/01/2020 1308 Last data filed at 02/01/2020 1000 Gross per 24 hour  Intake 240 ml  Output 1300 ml  Net -1060 ml   Filed Weights   01/29/20 0200 01/30/20 0022 01/31/20 0000  Weight: 76.7 kg 76.2 kg 74.8 kg   Exam  General: Well developed, chronically ill-appearing, NAD  HEENT: [place], facial bruising along with suture noted above the right eyebrow, mucous membranes moist.   Cardiovascular: S1 S2 auscultated, tachycardic  Respiratory: Diminished breath sounds, no wheezing  Abdomen: Soft, nontender, nondistended, + bowel sounds  Extremities: warm dry without cyanosis clubbing or edema  Neuro: AAOx3, nonfocal, no further tremor noted  Psych: pleasant, appropriate mood and affect  Data Reviewed: I have personally reviewed following labs and imaging studies  CBC: Recent Labs  Lab 01/27/20 0442 01/27/20 0442 01/28/20 1109 01/29/20 0756 01/30/20 0509 01/31/20 0302 02/01/20 0355  WBC 4.1  --  4.8  --  6.2 6.0 5.1  NEUTROABS  --   --  3.6  --   --   --   --  HGB 8.3*   < > 6.9* 10.5* 9.9* 9.7* 9.6*  HCT 23.8*   < > 20.4* 31.1* 29.5* 29.7* 29.7*  MCV 106.7*  --  108.5*  --  103.9* 105.7* 106.1*  PLT 39*  --  63*  --  108* 142* 169   < > = values in this interval not displayed.   Basic Metabolic Panel: Recent Labs  Lab 01/27/20 0441 01/27/20 0442 01/28/20 1109 01/29/20 0249 01/30/20 0509 01/31/20 0302 02/01/20 0355  NA  --    < > 134* 137 137 136 135  K  --    < > 3.3* 3.6 4.2 4.1 3.8  CL  --    < > 99 98 100 99 98  CO2  --    < > 23 30 28 27 27   GLUCOSE  --    < > 103* 110* 102* 112* 107*  BUN  --    < > 6* 8 11 12  6*  CREATININE  --    < > 0.88 0.78 0.81 0.87 0.78  CALCIUM   --    < > 7.9* 8.2* 8.3* 8.9 8.7*  MG 0.9*  --  2.2  --   --  1.5* 1.7  PHOS  --   --  1.3*  --   --   --   --    < > = values in this interval not displayed.   GFR: Estimated Creatinine Clearance: 84.9 mL/min (by C-G formula based on SCr of 0.78 mg/dL). Liver Function Tests: Recent Labs  Lab 01/26/20 0223 01/28/20 1109  AST 149*  --   ALT 75*  --   ALKPHOS 104  --   BILITOT 1.8*  --   PROT 5.6*  --   ALBUMIN 2.9* 2.5*   No results for input(s): LIPASE, AMYLASE in the last 168 hours. No results for input(s): AMMONIA in the last 168 hours. Coagulation Profile: Recent Labs  Lab 01/26/20 0223  INR 1.1   Cardiac Enzymes: No results for input(s): CKTOTAL, CKMB, CKMBINDEX, TROPONINI in the last 168 hours. BNP (last 3 results) No results for input(s): PROBNP in the last 8760 hours. HbA1C: No results for input(s): HGBA1C in the last 72 hours. CBG: No results for input(s): GLUCAP in the last 168 hours. Lipid Profile: No results for input(s): CHOL, HDL, LDLCALC, TRIG, CHOLHDL, LDLDIRECT in the last 72 hours. Thyroid Function Tests: No results for input(s): TSH, T4TOTAL, FREET4, T3FREE, THYROIDAB in the last 72 hours. Anemia Panel: No results for input(s): VITAMINB12, FOLATE, FERRITIN, TIBC, IRON, RETICCTPCT in the last 72 hours. Urine analysis: No results found for: COLORURINE, APPEARANCEUR, LABSPEC, PHURINE, GLUCOSEU, HGBUR, BILIRUBINUR, KETONESUR, PROTEINUR, UROBILINOGEN, NITRITE, LEUKOCYTESUR Sepsis Labs: @LABRCNTIP (procalcitonin:4,lacticidven:4)  ) Recent Results (from the past 240 hour(s))  Respiratory Panel by RT PCR (Flu A&B, Covid) - Nasopharyngeal Swab     Status: None   Collection Time: 01/26/20  2:27 AM   Specimen: Nasopharyngeal Swab  Result Value Ref Range Status   SARS Coronavirus 2 by RT PCR NEGATIVE NEGATIVE Final    Comment: (NOTE) SARS-CoV-2 target nucleic acids are NOT DETECTED.  The SARS-CoV-2 RNA is generally detectable in upper respiratoy specimens  during the acute phase of infection. The lowest concentration of SARS-CoV-2 viral copies this assay can detect is 131 copies/mL. A negative result does not preclude SARS-Cov-2 infection and should not be used as the sole basis for treatment or other patient management decisions. A negative result may occur with  improper specimen collection/handling, submission  of specimen other than nasopharyngeal swab, presence of viral mutation(s) within the areas targeted by this assay, and inadequate number of viral copies (<131 copies/mL). A negative result must be combined with clinical observations, patient history, and epidemiological information. The expected result is Negative.  Fact Sheet for Patients:  PinkCheek.be  Fact Sheet for Healthcare Providers:  GravelBags.it  This test is no t yet approved or cleared by the [place] FDA and  has been authorized for detection and/or diagnosis of SARS-CoV-2 by FDA under an Emergency Use Authorization (EUA). This EUA will remain  in effect (meaning this test can be used) for the duration of the COVID-19 declaration under Section 564(b)(1) of the Act, 21 U.S.C. section 360bbb-3(b)(1), unless the authorization is terminated or revoked sooner.     Influenza A by PCR NEGATIVE NEGATIVE Final   Influenza B by PCR NEGATIVE NEGATIVE Final    Comment: (NOTE) The Xpert Xpress SARS-CoV-2/FLU/RSV assay is intended as an aid in  the diagnosis of influenza from Nasopharyngeal swab specimens and  should not be used as a sole basis for treatment. Nasal washings and  aspirates are unacceptable for Xpert Xpress SARS-CoV-2/FLU/RSV  testing.  Fact Sheet for Patients: PinkCheek.be  Fact Sheet for Healthcare Providers: GravelBags.it  This test is not yet approved or cleared by the [place] FDA and  has been authorized for detection and/or  diagnosis of SARS-CoV-2 by  FDA under an Emergency Use Authorization (EUA). This EUA will remain  in effect (meaning this test can be used) for the duration of the  Covid-19 declaration under Section 564(b)(1) of the Act, 21  U.S.C. section 360bbb-3(b)(1), unless the authorization is  terminated or revoked. Performed at [facility], [place] [address], [place], [place] [address]       Radiology Studies: No results found.   Scheduled Meds: . carvedilol  3.125 mg Oral BID WC  . docusate sodium  100 mg Oral BID  . famotidine  40 mg Oral Daily  . feeding supplement  237 mL Oral TID BM  . folic acid  1 mg Oral Daily  . multivitamin with minerals  1 tablet Oral Daily  . nicotine  14 mg Transdermal Daily  . sodium chloride flush  3 mL Intravenous Q12H  . thiamine  100 mg Oral Daily   Or  . thiamine  100 mg Intravenous Daily   Continuous Infusions: . sodium chloride 1,000 mL (01/31/20 1140)  . sodium chloride 1,000 mL (02/01/20 0925)  . ampicillin-sulbactam (UNASYN) IV 1.5 g (02/01/20 0926)     LOS: 5 days   Time Spent in minutes   30 minutes  Kym Fenter D.O. on 02/01/2020 at 1:08 PM  Between 7am to 7pm - Please see pager noted on [email or web address]  After 7pm go to [email or web address]  And look for the night coverage person covering for me after hours  [facility] Office  [phone]

## 2020-02-01 NOTE — Progress Notes (Addendum)
Daily Rounding Note  02/01/2020, 10:04 AM  LOS: 5 days   SUBJECTIVE:   Chief complaint:   FOBT positive anemia.  Patient feels pretty well.  No complaints of pain.  Appetite improving.  Eating 75% or more of meals.  No dark stools reported  OBJECTIVE:         Vital signs in last 24 hours:    Temp:  [97.7 F (36.5 C)-98.1 F (36.7 C)] 97.7 F (36.5 C) (10/29 0929) Pulse Rate:  [101-121] 118 (10/29 0929) Resp:  [18-31] 18 (10/29 0929) BP: (103-162)/(67-99) 103/75 (10/29 0929) SpO2:  [89 %-98 %] 98 % (10/29 0929) Last BM Date: 01/28/20 Filed Weights   01/29/20 0200 01/30/20 0022 01/31/20 0000  Weight: 76.7 kg 76.2 kg 74.8 kg   General: Looks rough with the bruising and sutured laceration around his eyes and head.  Alert. Heart: RRR. Chest: Clear bilaterally.  No labored breathing.  Does have a mucoid/wet cough. Abdomen: Soft without tenderness.  No distention.  Active bowel sounds Extremities: No CCE. Neuro/Psych: Moves all 4 limbs.  No tremors.  Fully alert and oriented.  Affect more engaged and brighter than a few days ago.  Intake/Output from previous day: 10/28 0701 - 10/29 0700 In: 320 [P.O.:120; I.V.:100; IV Piggyback:100] Out: 1050 [Urine:1050]  Intake/Output this shift: No intake/output data recorded.  Lab Results: Recent Labs    01/30/20 0509 01/31/20 0302 02/01/20 0355  WBC 6.2 6.0 5.1  HGB 9.9* 9.7* 9.6*  HCT 29.5* 29.7* 29.7*  PLT 108* 142* 169   BMET Recent Labs    01/30/20 0509 01/31/20 0302 02/01/20 0355  NA 137 136 135  K 4.2 4.1 3.8  CL 100 99 98  CO2 28 27 27   GLUCOSE 102* 112* 107*  BUN 11 12 6*  CREATININE 0.81 0.87 0.78  CALCIUM 8.3* 8.9 8.7*   Scheduled Meds: . carvedilol  3.125 mg Oral BID WC  . docusate sodium  100 mg Oral BID  . famotidine  40 mg Oral Daily  . feeding supplement  237 mL Oral TID BM  . folic acid  1 mg Oral Daily  . multivitamin with minerals  1  tablet Oral Daily  . nicotine  14 mg Transdermal Daily  . sodium chloride flush  3 mL Intravenous Q12H  . thiamine  100 mg Oral Daily   Or  . thiamine  100 mg Intravenous Daily   Continuous Infusions: . sodium chloride 1,000 mL (01/31/20 1140)  . sodium chloride 1,000 mL (02/01/20 0925)  . ampicillin-sulbactam (UNASYN) IV 1.5 g (02/01/20 0926)   PRN Meds:.sodium chloride, sodium chloride, acetaminophen **OR** acetaminophen, albuterol, bisacodyl, HYDROcodone-acetaminophen, metoprolol tartrate, ondansetron **OR** ondansetron (ZOFRAN) IV, polyethylene glycol   ASSESMENT:   *   Acute on chronic anemia.  FOBT +.  Reports black stools at home for a few months. Hgb 6.9 >> 2 PRBCs >> 9.6.   01/31/2020 EGD: Portal hypertensive gastropathy.  Duodenal bulb nodularity.  No biopsies performed. Daily famotidine 40 mg in place  *    Thrombocytopenia.  Resolved.  INR 1.1.    *    Alcoholic liver disease with alcoholic hepatitis and possible cirrhosis.  Elevated alcohol level at time of admission.  Marked hepatic steatosis on CT.  *   3.4 mm mass at tail of pancreas with associated fat stranding, possibly pseudocyst but cystic neoplasm not excluded.  *    Physical and cognitive decline leading to fall at  home with prosthetic shoulder dislocation, scalp hematoma.  *    Adenomatous colon polyps in 2007, 2012, 08/2017. Dr. Loletha Carrow did not feel patient was physically ready to pursue colonoscopy prep/colonoscopy.  *    Non-Covid bilateral pneumonia, probably due to aspiration.  Day 5 Unasyn.  *    Sinus tachycardia.  Ischemic cardiomyopathy.  Tachycardia continues into the low 100s to maximum 118.   PLAN   *   Follow hemoglobins.  If recurrent drop, may need to pursue colonoscopy but for now will observe.  *    ?  Pursue MRI for evaluation of the pancreatic lesion?    Azucena Freed  02/01/2020, 10:04 AM Phone [phone]  Continue supportive care for his pneumonia and heme positive  anemia.  As he gets better would schedule MR abdomen with and without contrast.  These procedures require good breath holds that may last for up to 15 seconds and optimal cooperation will allow for optimal imaging.  This could even be deferred to the outpatient setting potentially but if we can get it before he goes home that will be better I think.  We will follow up over the weekend.  Gatha Mayer, MD, [facility] 02/01/2020 6:39 PM

## 2020-02-01 NOTE — Progress Notes (Signed)
During morning report, nurse noted iv was removed by pt.  Unable to place iv. Iv team consult place.

## 2020-02-01 NOTE — Progress Notes (Signed)
Occupational Therapy Treatment Patient Details Name: Fernando [name] MRN: [id number] DOB: July 01, 1955 Today's Date: 02/01/2020    History of present illness Pt is a 64 y.o. M with significant PMH of CHF with reduced EF, hypertension and chronic alcohol use. Presents after a fall. Found to have right shoulder dislocation and subsequently reduced. 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    OT comments  Patient requesting bathroom usage.  OT originally wanting patient to walk to toilet, but monitor cords were too mixed up, and patient requesting BSC due to urgency.  Patient continues to present with poor stand balance, decreased safety awareness to deficits, decreased activity tolerance and generalized weakness.  He is participating much better with OT, and moving better in general.  But overall, he still needs post acute rehab to ensure a safe transition home.  He does not have the balance and strength to care for himself without 24 hour assistance.  SNF discharge remains appropriate, OT will continue to follow in the acute setting.  Without 24 hour assist as needed, he will remain a very high fall risk.    Follow Up Recommendations  SNF;Supervision/Assistance - 24 hour    Equipment Recommendations       Recommendations for Other Services      Precautions / Restrictions Precautions Precautions: Fall;Other (comment) Precaution Comments: watch 02/HR Required Braces or Orthoses: Sling Restrictions Weight Bearing Restrictions: No RUE Weight Bearing: Weight bearing as tolerated Other Position/Activity Restrictions: patient continues to decline sling.       Mobility Bed Mobility   Bed Mobility: Supine to Sit     Supine to sit: Supervision Sit to supine: Supervision   General bed mobility comments: Seen standing at bedside with IV/telemetry and foley on opposite side of bed and tangled in sheets upon PT arrival.  Transfers Overall transfer level: Needs assistance Equipment  used: None Transfers: Sit to/from [facility] Sit to Stand: Min assist Stand pivot transfers: Min assist       General transfer comment: continues to lose balance backwards    Balance Overall balance assessment: Needs assistance Sitting-balance support: Feet supported;No upper extremity supported Sitting balance-Leahy Scale: Good   Postural control: Posterior lean Standing balance support: Bilateral upper extremity supported Standing balance-Leahy Scale: Poor Standing balance comment: Requires UE support in standing.                           ADL either performed or assessed with clinical judgement   ADL       Grooming: Wash/dry hands;Wash/dry face;Sitting;Set up                       [place] and Hygiene: Supervision/safety;Sitting/lateral lean       Functional mobility during ADLs: Minimal assistance;Rolling walker                         Cognition Arousal/Alertness: Awake/alert Behavior During Therapy: WFL for tasks assessed/performed Overall Cognitive Status: Impaired/Different from baseline Area of Impairment: Safety/judgement                     Memory: Decreased recall of precautions   Safety/Judgement: Decreased awareness of safety Awareness: Intellectual Problem Solving: Slow processing;Requires verbal cues;Requires tactile cues General Comments: Pt found climbing OOB between bed rails on opposite side of lines/telemetry and catheter bag tangled in bed sheets as PT was walking by.  When asked where he was going, "I'm going out to smoke a quick one." When asked later once sitting on EOB, "I am not sure why." Pt is confused.                    General Comments Vital signs not observed as telemetry was pulled off. Donned 02 post session; pt with pressure sore behind left ear so placed foam pad around tubing for comfort.    Pertinent Vitals/ Pain       Pain Assessment:  No/denies pain Faces Pain Scale: No hurt  Home Living                                                        Frequency  Min 2X/week        Progress Toward Goals  OT Goals(current goals can now be found in the care plan section)  Progress towards OT goals: Progressing toward goals  Acute Rehab OT Goals Patient Stated Goal: wanting to go home OT Goal Formulation: With patient Time For Goal Achievement: 02/10/20 Potential to Achieve Goals: [place] Discharge plan remains appropriate    Co-evaluation                 AM-PAC OT "6 Clicks" Daily Activity     Outcome Measure   Help from another person eating meals?: None Help from another person taking care of personal grooming?: None Help from another person toileting, which includes using toliet, bedpan, or urinal?: A Little Help from another person bathing (including washing, rinsing, drying)?: A Little Help from another person to put on and taking off regular upper body clothing?: A Little Help from another person to put on and taking off regular lower body clothing?: A Little 6 Click Score: 20    End of Session Equipment Utilized During Treatment: Gait belt  OT Visit Diagnosis: Other abnormalities of gait and mobility (R26.89);Other symptoms and signs involving cognitive function   Activity Tolerance     Patient Left with call bell/phone within reach;in bed   Nurse Communication Other (comment) Bathroom usage        Time: 5188-4166 OT Time Calculation (min): 8 min  Charges: OT General Charges $OT Visit: 1 Visit OT Treatments $Self Care/Home Management : 8-22 mins  02/01/2020  Rich, OTR/L  Acute Rehabilitation Services  Office:  [phone]    Metta Clines 02/01/2020, 3:53 PM

## 2020-02-02 ENCOUNTER — Inpatient Hospital Stay (HOSPITAL_COMMUNITY): Payer: Medicare Other

## 2020-02-02 DIAGNOSIS — N179 Acute kidney failure, unspecified: Secondary | ICD-10-CM | POA: Diagnosis not present

## 2020-02-02 DIAGNOSIS — F10231 Alcohol dependence with withdrawal delirium: Secondary | ICD-10-CM

## 2020-02-02 DIAGNOSIS — J9602 Acute respiratory failure with hypercapnia: Secondary | ICD-10-CM

## 2020-02-02 DIAGNOSIS — W19XXXA Unspecified fall, initial encounter: Secondary | ICD-10-CM | POA: Diagnosis not present

## 2020-02-02 DIAGNOSIS — I5021 Acute systolic (congestive) heart failure: Secondary | ICD-10-CM | POA: Diagnosis not present

## 2020-02-02 LAB — BASIC METABOLIC PANEL
Anion gap: 11 (ref 5–15)
BUN: 9 mg/dL (ref 8–23)
CO2: 23 mmol/L (ref 22–32)
Calcium: 8.7 mg/dL — ABNORMAL LOW (ref 8.9–10.3)
Chloride: 100 mmol/L (ref 98–111)
Creatinine, Ser: 0.81 mg/dL (ref 0.61–1.24)
GFR, Estimated: >60 mL/min (ref >60)
Glucose, Bld: 115 mg/dL — ABNORMAL HIGH (ref 70–99)
Potassium: 3.7 mmol/L (ref 3.5–5.1)
Sodium: 134 mmol/L — ABNORMAL LOW (ref 135–145)

## 2020-02-02 LAB — MAGNESIUM: Magnesium: 1.8 mg/dL (ref 1.7–2.4)

## 2020-02-02 LAB — HEMOGLOBIN AND HEMATOCRIT, BLOOD
HCT: 29.9 % — ABNORMAL LOW (ref 39.0–52.0)
Hemoglobin: 9.7 g/dL — ABNORMAL LOW (ref 13.0–17.0)

## 2020-02-02 LAB — VITAMIN B12: Vitamin B-12: 411 pg/mL (ref 180–914)

## 2020-02-02 LAB — RETICULOCYTES
Immature Retic Fract: 17.3 % — ABNORMAL HIGH (ref 2.3–15.9)
RBC.: 2.85 MIL/uL — ABNORMAL LOW (ref 4.22–5.81)
Retic Count, Absolute: 80.9 10*3/uL (ref 19.0–186.0)
Retic Ct Pct: 2.8 % (ref 0.4–3.1)

## 2020-02-02 LAB — FERRITIN: Ferritin: 440 ng/mL — ABNORMAL HIGH (ref 24–336)

## 2020-02-02 MED ORDER — FUROSEMIDE 10 MG/ML IJ SOLN
40.0000 mg | Freq: Two times a day (BID) | INTRAMUSCULAR | Status: DC
  Administered 2020-02-02 – 2020-02-04 (×4): 40 mg via INTRAVENOUS
  Filled 2020-02-02 (×5): qty 4

## 2020-02-02 NOTE — Progress Notes (Signed)
Incontinent care provided and new condom cath placed after old one had fallen off. Pt able to stand and hold walker while being cleaned. Pt noted to be tearful stating "I'm nothing but a big pain in the butt". Reassured patient with emotional support that he is not a pain and staff is happy to care for him. Educated patient on the need to be in the hospital at this time and his acute current state requiring him to need care. Pt verbalized understanding.

## 2020-02-02 NOTE — Progress Notes (Signed)
Ambulated patient in room on RA. He ambulated to doorway and back. O2 sat decreased to 86-88 and patient displayed a little dyspnea. Was able to recover once back in chair with 2L O2 back on. Pt ambulated with walker.

## 2020-02-02 NOTE — Progress Notes (Signed)
PROGRESS NOTE    Fernando [name]   DEY:[id number]  DOB: 09/20/1955  DOA: 01/26/2020 PCP: [name], MD   Brief Narrative:    HPI on 01/26/2020 by Dr. Hillard Danker [name] medical history significant ofchronic systolic CHF (EF 31-49% in 10/2019); ETOH dependence with DTs during 10/2019 admission; prosthetic shoulder joint infection; HTN; and HLD presenting after a fall.He reports that he got up off the commode overnight, lost his balance, and hit his head with significant bleeding. He had been drinking "a little bit" of alcohol, about as much as he usually drinks. He reports that his R hip hurts but he is able to move it; he isn't sure he can bear weight on it. His forehead is also sore.   In ED> 88% on room air, SBP in 50s,  Lactate 4.6 -> 2.1.   Interim history  Patient admitted with fall, hypotension-likely secondary to alcohol.  Found to have sinus tachycardia.  Also noted to have anemia and received blood transfusion.  FOBT was positive.  Gastroenterology and cardiology consulted. S/p EGD.  Subjective: Has a chronic cough and today is not worse than usual. Abmulated in the room and is very short of breath with mild exertion. No other complaints. Eating and drinking OK. No pain.   Assessment & Plan:   Principal Problem: Anemia likely due to acute GI bleed/ hemoccult + - Hb 9.2 dropping to Hb 6.9 on 10/25 and improving to 9-10 range after 1 U PRBC - now stable - complained of dark stools -10/28> EGD showed esophagitis, portal hypertensive gastropathy - no obvious bleeding or ongoing drop in Hb now - he also has macrocytosis iwhch may be directly related to ETOH but will check an anemia panel to be sure he has no deficiencies   Active Problems: ETOH abuse and withdrawal with acute encephalopathy Marked hepatic steatosis on CT -   Withdrawal resolving- CIWA 1 this AM - he states he plans to quit and need no assistance - cont Thiamine  and folate - SW consulted for substance abuse resources in case he changes his mind  Acute hypoxic respiratory failure- multifactorial Interstitial lung disease/ Fibrosis on imaging (may have chronic hypoxia) - T max 100.3 on 10/23 and 100.4 on 10/26-suspected aspiration- Unasyn started on 10/25- fever curve improved - Fibrosis on imaging may be suggestive of COPD- he is a chronic smoker - will need outpt PFTS - currently still hypoxic to 87% on after taking a few steps and notably short of breath - ECHO 10/26> EF 25 to 30%, grade 1 d CHF- full report below --acute on chronic systolic CHF> -  coarse crackles on exam today at bases- CXR today consistent with increased pulmonary opacities consistent with pulm edema - start Lasix 40 mg BID - Losartan on hold due to hypotension - will resume Losartan today - cont coreg    Hypotension with h/o Essential hypertension - resuming losartan- cont Coreg  Sinus tachycardia - chronic per cardiology consult on 10/27 - HR mostly < 100 but occassionally in low 100s  Hypokalemia and Hypomagnesemia - checking intermittently and repleting  Laceration above right eye - sutured in ED    Dislocation of prosthetic shoulder joint, initial encounter ([place]) Fall at home - occurring after fall - reduced int he ED - PT recommends SNF due to balance issues/ wide base gait (checking B12 level)  Acute thrombocytopenia - may be depletion in setting of acute GI bleed - platelets have normalized  Tobacco dependence - on nicotine patch    Pancreatic mass on CT - per CT report> 3.4 cm solid and cystic pancreatic tail mass. There is some regional fat stranding and indistinguishable margins with the stomach, possibly a pseudocyst but cystic neoplasm is not exclude - will need follow up with MRI- can be done as outpatient  Time spent in minutes:40 DVT prophylaxis: Place and maintain sequential compression device Start: 01/28/20 1252  Code Status: Full  code Family Communication:  Disposition Plan:  Status is: Inpatient  Remains inpatient appropriate because:ongiong work up and treatment of hypoxia   Dispo: The patient is from: Home              Anticipated d/c is to: SNF              Anticipated d/c date is: > 3 days              Patient currently is not medically stable to d/c.      Consultants:   GI  CArdiology Procedures:   none Antimicrobials:  Anti-infectives (From admission, onward)   Start     Dose/Rate Route Frequency Ordered Stop   01/28/20 1000  ampicillin-sulbactam (UNASYN) 1.5 g in sodium chloride 0.9 % 100 mL IVPB        1.5 g 200 mL/hr over 30 Minutes Intravenous Every 6 hours 01/28/20 0854         Objective: Vitals:   02/02/20 0415 02/02/20 0747 02/02/20 0800 02/02/20 1200  BP: (!) 143/70 97/64    Pulse: (!) 110 91 100 96  Resp: 19 20    Temp: 98.9 F (37.2 C) 97.8 F (36.6 C)    TempSrc: Oral Oral    SpO2: (!) 85% 93% 95%   Weight: 75.7 kg     Height:        Intake/Output Summary (Last 24 hours) at 02/02/2020 1254 Last data filed at 02/02/2020 0458 Gross per 24 hour  Intake 960 ml  Output 1075 ml  Net -115 ml   Filed Weights   01/30/20 0022 01/31/20 0000 02/02/20 0415  Weight: 76.2 kg 74.8 kg 75.7 kg    Examination: General exam: Appears comfortable  HEENT: PERRLA, oral mucosa moist, no sclera icterus or thrush Respiratory system: coarse crackles at b/l bases of lungs- pulse ox 87 % when taking a few steps in the room Cardiovascular system: S1 & S2 heard, RRR.   Gastrointestinal system: Abdomen soft, non-tender, nondistended. Normal bowel sounds. Central nervous system: Alert and oriented. No focal neurological deficits. Extremities: No cyanosis, clubbing or edema Skin: No rashes or ulcers- bruising on face note Psychiatry:  Mood & affect appropriate.     Data Reviewed: I have personally reviewed following labs and imaging studies  CBC: Recent Labs  Lab 01/27/20 0442  01/27/20 0442 01/28/20 1109 01/28/20 1109 01/29/20 0756 01/30/20 0509 01/31/20 0302 02/01/20 0355 02/02/20 0633  WBC 4.1  --  4.8  --   --  6.2 6.0 5.1  --   NEUTROABS  --   --  3.6  --   --   --   --   --   --   HGB 8.3*   < > 6.9*   < > 10.5* 9.9* 9.7* 9.6* 9.7*  HCT 23.8*   < > 20.4*   < > 31.1* 29.5* 29.7* 29.7* 29.9*  MCV 106.7*  --  108.5*  --   --  103.9* 105.7* 106.1*  --   PLT 39*  --  63*  --   --  108* 142* 169  --    < > = values in this interval not displayed.   Basic Metabolic Panel: Recent Labs  Lab 01/27/20 0441 01/27/20 0442 01/28/20 1109 01/28/20 1109 01/29/20 0249 01/30/20 0509 01/31/20 0302 02/01/20 0355 02/02/20 0633  NA  --    < > 134*   < > 137 137 136 135 134*  K  --    < > 3.3*   < > 3.6 4.2 4.1 3.8 3.7  CL  --    < > 99   < > 98 100 99 98 100  CO2  --    < > 23   < > 30 28 27 27 23   GLUCOSE  --    < > 103*   < > 110* 102* 112* 107* 115*  BUN  --    < > 6*   < > 8 11 12  6* 9  CREATININE  --    < > 0.88   < > 0.78 0.81 0.87 0.78 0.81  CALCIUM  --    < > 7.9*   < > 8.2* 8.3* 8.9 8.7* 8.7*  MG 0.9*  --  2.2  --   --   --  1.5* 1.7 1.8  PHOS  --   --  1.3*  --   --   --   --   --   --    < > = values in this interval not displayed.   GFR: Estimated Creatinine Clearance: 83.9 mL/min (by C-G formula based on SCr of 0.81 mg/dL). Liver Function Tests: Recent Labs  Lab 01/28/20 1109  ALBUMIN 2.5*   No results for input(s): LIPASE, AMYLASE in the last 168 hours. No results for input(s): AMMONIA in the last 168 hours. Coagulation Profile: No results for input(s): INR, PROTIME in the last 168 hours. Cardiac Enzymes: No results for input(s): CKTOTAL, CKMB, CKMBINDEX, TROPONINI in the last 168 hours. BNP (last 3 results) No results for input(s): PROBNP in the last 8760 hours. HbA1C: No results for input(s): HGBA1C in the last 72 hours. CBG: No results for input(s): GLUCAP in the last 168 hours. Lipid Profile: No results for input(s): CHOL, HDL,  LDLCALC, TRIG, CHOLHDL, LDLDIRECT in the last 72 hours. Thyroid Function Tests: No results for input(s): TSH, T4TOTAL, FREET4, T3FREE, THYROIDAB in the last 72 hours. Anemia Panel: No results for input(s): VITAMINB12, FOLATE, FERRITIN, TIBC, IRON, RETICCTPCT in the last 72 hours. Urine analysis: No results found for: COLORURINE, APPEARANCEUR, LABSPEC, PHURINE, GLUCOSEU, HGBUR, BILIRUBINUR, KETONESUR, PROTEINUR, UROBILINOGEN, NITRITE, LEUKOCYTESUR Sepsis Labs: @LABRCNTIP (procalcitonin:4,lacticidven:4) ) Recent Results (from the past 240 hour(s))  Respiratory Panel by RT PCR (Flu A&B, Covid) - Nasopharyngeal Swab     Status: None   Collection Time: 01/26/20  2:27 AM   Specimen: Nasopharyngeal Swab  Result Value Ref Range Status   SARS Coronavirus 2 by RT PCR NEGATIVE NEGATIVE Final    Comment: (NOTE) SARS-CoV-2 target nucleic acids are NOT DETECTED.  The SARS-CoV-2 RNA is generally detectable in upper respiratoy specimens during the acute phase of infection. The lowest concentration of SARS-CoV-2 viral copies this assay can detect is 131 copies/mL. A negative result does not preclude SARS-Cov-2 infection and should not be used as the sole basis for treatment or other patient management decisions. A negative result may occur with  improper specimen collection/handling, submission of specimen other than nasopharyngeal swab, presence of viral mutation(s) within the areas targeted by this assay,  and inadequate number of viral copies (<131 copies/mL). A negative result must be combined with clinical observations, patient history, and epidemiological information. The expected result is Negative.  Fact Sheet for Patients:  PinkCheek.be  Fact Sheet for Healthcare Providers:  GravelBags.it  This test is no t yet approved or cleared by the [place] FDA and  has been authorized for detection and/or diagnosis of SARS-CoV-2 by FDA  under an Emergency Use Authorization (EUA). This EUA will remain  in effect (meaning this test can be used) for the duration of the COVID-19 declaration under Section 564(b)(1) of the Act, 21 U.S.C. section 360bbb-3(b)(1), unless the authorization is terminated or revoked sooner.     Influenza A by PCR NEGATIVE NEGATIVE Final   Influenza B by PCR NEGATIVE NEGATIVE Final    Comment: (NOTE) The Xpert Xpress SARS-CoV-2/FLU/RSV assay is intended as an aid in  the diagnosis of influenza from Nasopharyngeal swab specimens and  should not be used as a sole basis for treatment. Nasal washings and  aspirates are unacceptable for Xpert Xpress SARS-CoV-2/FLU/RSV  testing.  Fact Sheet for Patients: PinkCheek.be  Fact Sheet for Healthcare Providers: GravelBags.it  This test is not yet approved or cleared by the [place] FDA and  has been authorized for detection and/or diagnosis of SARS-CoV-2 by  FDA under an Emergency Use Authorization (EUA). This EUA will remain  in effect (meaning this test can be used) for the duration of the  Covid-19 declaration under Section 564(b)(1) of the Act, 21  U.S.C. section 360bbb-3(b)(1), unless the authorization is  terminated or revoked. Performed at [facility], [place] [address] [place], [place] [address]          Radiology Studies: No results found.    Scheduled Meds: . carvedilol  3.125 mg Oral BID WC  . docusate sodium  100 mg Oral BID  . famotidine  40 mg Oral Daily  . feeding supplement  237 mL Oral TID BM  . folic acid  1 mg Oral Daily  . multivitamin with minerals  1 tablet Oral Daily  . nicotine  14 mg Transdermal Daily  . sodium chloride flush  3 mL Intravenous Q12H  . thiamine  100 mg Oral Daily   Or  . thiamine  100 mg Intravenous Daily   Continuous Infusions: . sodium chloride 1,000 mL (01/31/20 1140)  . sodium chloride 1,000 mL (02/01/20 1526)  .  ampicillin-sulbactam (UNASYN) IV 1.5 g (02/02/20 0933)     LOS: 6 days      Debbe Odea, MD [facility] Pager: [email or web address] 02/02/2020, 12:54 PM

## 2020-02-02 NOTE — Progress Notes (Signed)
Pt has been up in chair all day. He attempted to stay in bed after having his chest xray in radiology but preferred sitting up. Transfers with min assist x 1 using walker. Pt has had no s/s of ETOH withdrawal scoring low on the CIWA score. Condom cath remains in place, secured and attached to drainage bag. Clear yellow urine output noted. Pt has been a/o x 3 however, occasionally requires reminders of situation and plan of care.  He has been calm and cooperative. Tele remains on. In NAD, will continue to monitor.

## 2020-02-03 ENCOUNTER — Encounter (HOSPITAL_COMMUNITY): Payer: Self-pay | Admitting: Gastroenterology

## 2020-02-03 DIAGNOSIS — D638 Anemia in other chronic diseases classified elsewhere: Secondary | ICD-10-CM

## 2020-02-03 DIAGNOSIS — K8689 Other specified diseases of pancreas: Secondary | ICD-10-CM | POA: Diagnosis not present

## 2020-02-03 DIAGNOSIS — J189 Pneumonia, unspecified organism: Secondary | ICD-10-CM | POA: Diagnosis not present

## 2020-02-03 DIAGNOSIS — I5021 Acute systolic (congestive) heart failure: Secondary | ICD-10-CM | POA: Diagnosis not present

## 2020-02-03 DIAGNOSIS — J9601 Acute respiratory failure with hypoxia: Secondary | ICD-10-CM | POA: Diagnosis not present

## 2020-02-03 DIAGNOSIS — W19XXXA Unspecified fall, initial encounter: Secondary | ICD-10-CM | POA: Diagnosis not present

## 2020-02-03 LAB — IRON AND TIBC
Iron: 40 ug/dL — ABNORMAL LOW (ref 45–182)
Saturation Ratios: 18 % (ref 17.9–39.5)
TIBC: 218 ug/dL — ABNORMAL LOW (ref 250–450)
UIBC: 178 ug/dL

## 2020-02-03 LAB — BASIC METABOLIC PANEL
Anion gap: 14 (ref 5–15)
BUN: 9 mg/dL (ref 8–23)
CO2: 26 mmol/L (ref 22–32)
Calcium: 9.1 mg/dL (ref 8.9–10.3)
Chloride: 97 mmol/L — ABNORMAL LOW (ref 98–111)
Creatinine, Ser: 0.95 mg/dL (ref 0.61–1.24)
GFR, Estimated: >60 mL/min (ref >60)
Glucose, Bld: 136 mg/dL — ABNORMAL HIGH (ref 70–99)
Potassium: 3 mmol/L — ABNORMAL LOW (ref 3.5–5.1)
Sodium: 137 mmol/L (ref 135–145)

## 2020-02-03 LAB — FOLATE: Folate: 30.2 ng/mL (ref >5.9)

## 2020-02-03 LAB — MAGNESIUM: Magnesium: 1.6 mg/dL — ABNORMAL LOW (ref 1.7–2.4)

## 2020-02-03 MED ORDER — ENOXAPARIN SODIUM 40 MG/0.4ML ~~LOC~~ SOLN
40.0000 mg | SUBCUTANEOUS | Status: DC
  Administered 2020-02-03 – 2020-02-04 (×2): 40 mg via SUBCUTANEOUS
  Filled 2020-02-03 (×2): qty 0.4

## 2020-02-03 MED ORDER — FUROSEMIDE 10 MG/ML IJ SOLN
40.0000 mg | Freq: Once | INTRAMUSCULAR | Status: AC
  Administered 2020-02-03: 40 mg via INTRAVENOUS

## 2020-02-03 MED ORDER — POTASSIUM CHLORIDE CRYS ER 20 MEQ PO TBCR
40.0000 meq | EXTENDED_RELEASE_TABLET | ORAL | Status: AC
  Administered 2020-02-03 (×2): 40 meq via ORAL
  Filled 2020-02-03 (×2): qty 2

## 2020-02-03 NOTE — Progress Notes (Signed)
Patient up to chair tonight. States he feels more comfortable in chair, chair alarm in place.  Ice water provided, personal items within reach.  Reminded patient to call before getting up.

## 2020-02-03 NOTE — Progress Notes (Signed)
Called to patient room, chair alarm alarming.  Patient irritated, wanted to know why he has an alarm.  Advised patient he is still a fall risk as he is unsteady on his feet. Patient removed condom catheter saying it was uncomfortable, urinal within reach.  Patient still up in chair, call light, personal belongings within reach.  Advised patient to call before getting up. Chair alarm active

## 2020-02-03 NOTE — Progress Notes (Signed)
Performed walk test/O2 challenge with patient. Patient was at 89% on RA upon getting out of chair at the beginning of the test, no s/s and denied feelings of dyspnea or air hunger. Proceeded to walk with O2 on at [facility], patient progressively dropped to as low as 84% on 10L oxygen, again with no s/s or feelings of dyspnea or air hunger. We rested and allowed him to reoxygenate. Once we did this for approximately 2 minutes, sats climbed to 98%. We again started to walk and he stayed in the 90's for quite some time at 1L O2, then proceeded to slowly drop to 89% at the end of the test, just before sitting in his chair. He was unable to maintain sats above 90% with less than 1L oxygen. SBP after walking was only 110

## 2020-02-03 NOTE — Progress Notes (Signed)
Patient alert and oriented x 4, interacting appropriately with staff.HR continues to be tachy but regular, increasing with activity and then settling down to upper 90s or low 100's, which is not as high as earlier in the week. Lungs clear in all lobes but right lower lobe, which is diminished. Patient does have intermittent  nonproductive cough, denies any dyspnea. Additionally, oxygen sats vary from 88-93% at rest; however, patient has no feelings of SOB and refuses to wear oxygen. Will continue to offer. Patient has active bowel sounds in all quads, reporting frequent gas but only small BMs daily. HE is currently using condom cath for urination, as he tried the urinal and said he can't use it efficiently yet; has tremors causing difficulty holding urinal in place. There is no edema to BLE at this time. Patient has been good about calling for assistance with toiletting and transferring; he verbalized understanding that he needs supervision/standby assist and  Has been compliant with this today. IV Unasyn continues TID, Lovenox SQ started today, and IV lasix continues despite patient's low SBP readings. AM dose held for SBP 77, additional afternoon dose ordered and given for SBP 110. Patient refused both pneumonia and flu vaccines at this time despite education to their benefit, especially with recent pneumonia diagnosis. Patient is able to make all needs known. Will monitor.

## 2020-02-03 NOTE — Progress Notes (Signed)
   Patient Name: Fernando [name] Date of Encounter: 02/03/2020, 5:41 PM    Subjective  Significantly improved more alert calmer.  Withdrawal resolved/resolving Being followed for GI bleeding, pancreatic mass EGD demonstrated portal gastropathy on 1028 no further evidence of bleeding Anticipating discharge to SNF for PT OT this week   Objective  BP 110/62   Pulse (!) 107   Temp 98.6 F (37 C) (Oral)   Resp 16   Ht 5\' 7"  (1.702 m)   Wt 74.8 kg   SpO2 91%   BMI 25.83 kg/m  Lying in bed without oxygen resting comfortably  CBC Latest Ref Rng & Units 02/02/2020 02/01/2020 01/31/2020  WBC 4.0 - 10.5 K/uL - 5.1 6.0  Hemoglobin 13.0 - 17.0 g/dL 9.7(L) 9.6(L) 9.7(L)  Hematocrit 39 - 52 % 29.9(L) 29.7(L) 29.7(L)  Platelets 150 - 400 K/uL - 169 142(L)     Assessment and Plan  Heme positive anemia with mild portal gastropathy on EGD.  I think there is a component of chronic disease anemia and perhaps alcohol induced as well.  He is stable to improved at this point.  No signs of acute GI bleeding.   Pancreatic mass with cystic and solid components measuring 3.4 cm in the tail.  Patient reports he is claustrophobic   GI is going to sign off today.  As far as his pancreatic mass he could potentially have an endoscopic ultrasound versus MRI.  He is not really ready for either.  I think he might be able to hold his breath now but he reports that he is claustrophobic and would prefer an open MRI.  Given the need for breath-holding with MRI of the pancreas I am not inclined to provide sedation for that procedure here I think we would get a better exam with an open MRI and again may proceed to EUS instead.  We will arrange for an office appointment with Dr. Henrene Pastor, his primary gastroenterologist versus one of our advanced practitioners in November to reassess and determine next best step.  Gatha Mayer, MD, [facility] 02/03/2020 5:41 PM

## 2020-02-03 NOTE — Progress Notes (Addendum)
PROGRESS NOTE    Fernando [name]   OHY:[id number]  DOB: 05/19/1955  DOA: 01/26/2020 PCP: [name], MD   Brief Narrative:    HPI on 01/26/2020 by Dr. Hillard Danker [name] medical history significant ofchronic systolic CHF (EF 94-85% in 10/2019); ETOH dependence with DTs during 10/2019 admission; prosthetic shoulder joint infection; HTN; and HLD presenting after a fall.He reports that he got up off the commode overnight, lost his balance, and hit his head with significant bleeding. He had been drinking "a little bit" of alcohol, about as much as he usually drinks. He reports that his R hip hurts but he is able to move it; he isn't sure he can bear weight on it. His forehead is also sore.   In ED> 88% on room air, SBP in 50s,  Lactate 4.6 -> 2.1.   Interim history  Patient admitted with fall & hypotension.  Found to have sinus tachycardia.  Also noted to have anemia and FOBT was positive.   Began treatment for acute hypoxic respiratory failure related to pneumonia and acute systolic CHF S/p EGD.  Subjective: Ongoing mild cough.   Assessment & Plan:   Principal Problem: Anemia likely due to acute GI bleed/ hemoccult + in ED - Hb 9.2 dropping to Hb 6.9 on 10/25 and improving to 9-10 range after 1 U PRBC - now stable - complained of dark stools -10/28> EGD showed esophagitis, portal hypertensive gastropathy- Pepcid daily ordered by GI - no obvious bleeding or ongoing drop in Hb now - he also has macrocytosis which may be directly related to ETOH -  check an anemia panel to be sure he has no deficiencies> B12 is normal, folate pendind   Active Problems: ETOH abuse and withdrawal with acute encephalopathy Marked hepatic steatosis on CT -   Withdrawal resolving  - he states he plans to quit and need no assistance - cont Thiamine and folate - SW consulted for substance abuse resources in case he changes his mind  Acute hypoxic respiratory  failure- multifactorial  (A) Interstitial lung disease/ Fibrosis on imaging (may have chronic hypoxia) with superimposed pneumonia possibly related to aspiration - T max 100.3 on 10/23 and 100.4 on 10/26-suspected aspiration- Unasyn started on 10/25- fever curve improved - Fibrosis on imaging may be suggestive of COPD- he is a chronic smoker - will need outpt PFTS - cont Unasyn  (B) acute on chronic systolic CHF> -  coarse crackles on exam - CXR 10/30 consistent with increased pulmonary opacities consistent with pulm edema - ECHO 10/26> EF 25 to 30%, grade 1 d CHF- full report below - started Lasix 40 mg BID - Losartan on hold due to hypotension - cont coreg - SBP in 90-100 today- will give only 1 dose of IV Lasix and follow - he remains on O2 today ~ 1 L on exertion    Hypotension with h/o Essential hypertension  - cont Coreg-  - hold Losartan for now  Sinus tachycardia - chronic per cardiology consult on 10/27  Hypokalemia and Hypomagnesemia - checking intermittently and repleting  Laceration above right eye - sutured in ED    Dislocation of prosthetic shoulder joint, initial encounter ([facility]) - occurring after fall - reduced int he ED  Fall at home - PT recommends SNF due to balance issues/ wide base gait  -B12 level normal  Acute thrombocytopenia - may be depletion in setting of acute GI bleed - platelets have normalized   Tobacco dependence -  on nicotine patch    Pancreatic mass on CT - per CT report> 3.4 cm solid and cystic pancreatic tail mass. There is some regional fat stranding and indistinguishable margins with the stomach, possibly a pseudocyst but cystic neoplasm is not exclude - will need follow up with MRI- can be done as outpatient  Time spent in minutes:30 DVT prophylaxis: Place and maintain sequential compression device Start: 01/28/20 1252Can start Lovenox as platelets normalized Code Status: Full code Family Communication:  Disposition Plan:    Status is: Inpatient  Remains inpatient appropriate because:ongiong work up and treatment of hypoxia   Dispo: The patient is from: Home              Anticipated d/c is to: SNF - agreeable to go              Anticipated d/c date is: > 3 days              Patient currently is not medically stable to d/c.   Consultants:   GI  CArdiology Procedures:   none Antimicrobials:  Anti-infectives (From admission, onward)   Start     Dose/Rate Route Frequency Ordered Stop   01/28/20 1000  ampicillin-sulbactam (UNASYN) 1.5 g in sodium chloride 0.9 % 100 mL IVPB        1.5 g 200 mL/hr over 30 Minutes Intravenous Every 6 hours 01/28/20 0854         Objective: Vitals:   02/03/20 0300 02/03/20 0320 02/03/20 0325 02/03/20 0856  BP:  134/72    Pulse:  (!) 103    Resp:  19 13   Temp:  98.2 F (36.8 C)  98.4 F (36.9 C)  TempSrc:  Oral  Oral  SpO2:  96%    Weight: 74.8 kg     Height:        Intake/Output Summary (Last 24 hours) at 02/03/2020 1130 Last data filed at 02/03/2020 0855 Gross per 24 hour  Intake 1380 ml  Output 2301 ml  Net -921 ml   Filed Weights   01/31/20 0000 02/02/20 0415 02/03/20 0300  Weight: 74.8 kg 75.7 kg 74.8 kg    Examination: General exam: Appears comfortable  HEENT: PERRLA, oral mucosa moist, no sclera icterus or thrush Respiratory system: crackles at bases Cardiovascular system: S1 & S2 heard,  No murmurs- tachycardic Gastrointestinal system: Abdomen soft, non-tender, nondistended. Normal bowel sounds   Central nervous system: Alert and oriented. No focal neurological deficits. Extremities: No cyanosis, clubbing or edema Skin: No rashes or ulcers - bruising on face, laceration healing Psychiatry:  Mood & affect appropriate.     Data Reviewed: I have personally reviewed following labs and imaging studies  CBC: Recent Labs  Lab 01/28/20 1109 01/28/20 1109 01/29/20 0756 01/30/20 0509 01/31/20 0302 02/01/20 0355 02/02/20 0633  WBC 4.8   --   --  6.2 6.0 5.1  --   NEUTROABS 3.6  --   --   --   --   --   --   HGB 6.9*   < > 10.5* 9.9* 9.7* 9.6* 9.7*  HCT 20.4*   < > 31.1* 29.5* 29.7* 29.7* 29.9*  MCV 108.5*  --   --  103.9* 105.7* 106.1*  --   PLT 63*  --   --  108* 142* 169  --    < > = values in this interval not displayed.   Basic Metabolic Panel: Recent Labs  Lab 01/28/20 1109 01/29/20 0249 01/30/20 0509 01/31/20  0302 02/01/20 0355 02/02/20 0633 02/03/20 0921  NA 134*   < > 137 136 135 134* 137  K 3.3*   < > 4.2 4.1 3.8 3.7 3.0*  CL 99   < > 100 99 98 100 97*  CO2 23   < > 28 27 27 23 26   GLUCOSE 103*   < > 102* 112* 107* 115* 136*  BUN 6*   < > 11 12 6* 9 9  CREATININE 0.88   < > 0.81 0.87 0.78 0.81 0.95  CALCIUM 7.9*   < > 8.3* 8.9 8.7* 8.7* 9.1  MG 2.2  --   --  1.5* 1.7 1.8  --   PHOS 1.3*  --   --   --   --   --   --    < > = values in this interval not displayed.   GFR: Estimated Creatinine Clearance: 71.5 mL/min (by C-G formula based on SCr of 0.95 mg/dL). Liver Function Tests: Recent Labs  Lab 01/28/20 1109  ALBUMIN 2.5*   No results for input(s): LIPASE, AMYLASE in the last 168 hours. No results for input(s): AMMONIA in the last 168 hours. Coagulation Profile: No results for input(s): INR, PROTIME in the last 168 hours. Cardiac Enzymes: No results for input(s): CKTOTAL, CKMB, CKMBINDEX, TROPONINI in the last 168 hours. BNP (last 3 results) No results for input(s): PROBNP in the last 8760 hours. HbA1C: No results for input(s): HGBA1C in the last 72 hours. CBG: No results for input(s): GLUCAP in the last 168 hours. Lipid Profile: No results for input(s): CHOL, HDL, LDLCALC, TRIG, CHOLHDL, LDLDIRECT in the last 72 hours. Thyroid Function Tests: No results for input(s): TSH, T4TOTAL, FREET4, T3FREE, THYROIDAB in the last 72 hours. Anemia Panel: Recent Labs    02/02/20 1623 02/03/20 0921  VITAMINB12 411  --   FERRITIN 440*  --   TIBC  --  218*  IRON  --  40*  RETICCTPCT 2.8  --     Urine analysis: No results found for: COLORURINE, APPEARANCEUR, LABSPEC, PHURINE, GLUCOSEU, HGBUR, BILIRUBINUR, KETONESUR, PROTEINUR, UROBILINOGEN, NITRITE, LEUKOCYTESUR Sepsis Labs: @LABRCNTIP (procalcitonin:4,lacticidven:4) ) Recent Results (from the past 240 hour(s))  Respiratory Panel by RT PCR (Flu A&B, Covid) - Nasopharyngeal Swab     Status: None   Collection Time: 01/26/20  2:27 AM   Specimen: Nasopharyngeal Swab  Result Value Ref Range Status   SARS Coronavirus 2 by RT PCR NEGATIVE NEGATIVE Final    Comment: (NOTE) SARS-CoV-2 target nucleic acids are NOT DETECTED.  The SARS-CoV-2 RNA is generally detectable in upper respiratoy specimens during the acute phase of infection. The lowest concentration of SARS-CoV-2 viral copies this assay can detect is 131 copies/mL. A negative result does not preclude SARS-Cov-2 infection and should not be used as the sole basis for treatment or other patient management decisions. A negative result may occur with  improper specimen collection/handling, submission of specimen other than nasopharyngeal swab, presence of viral mutation(s) within the areas targeted by this assay, and inadequate number of viral copies (<131 copies/mL). A negative result must be combined with clinical observations, patient history, and epidemiological information. The expected result is Negative.  Fact Sheet for Patients:  PinkCheek.be  Fact Sheet for Healthcare Providers:  GravelBags.it  This test is no t yet approved or cleared by the [place] FDA and  has been authorized for detection and/or diagnosis of SARS-CoV-2 by FDA under an Emergency Use Authorization (EUA). This EUA will remain  in effect (meaning  this test can be used) for the duration of the COVID-19 declaration under Section 564(b)(1) of the Act, 21 U.S.C. section 360bbb-3(b)(1), unless the authorization is terminated or revoked  sooner.     Influenza A by PCR NEGATIVE NEGATIVE Final   Influenza B by PCR NEGATIVE NEGATIVE Final    Comment: (NOTE) The Xpert Xpress SARS-CoV-2/FLU/RSV assay is intended as an aid in  the diagnosis of influenza from Nasopharyngeal swab specimens and  should not be used as a sole basis for treatment. Nasal washings and  aspirates are unacceptable for Xpert Xpress SARS-CoV-2/FLU/RSV  testing.  Fact Sheet for Patients: PinkCheek.be  Fact Sheet for Healthcare Providers: GravelBags.it  This test is not yet approved or cleared by the [place] FDA and  has been authorized for detection and/or diagnosis of SARS-CoV-2 by  FDA under an Emergency Use Authorization (EUA). This EUA will remain  in effect (meaning this test can be used) for the duration of the  Covid-19 declaration under Section 564(b)(1) of the Act, 21  U.S.C. section 360bbb-3(b)(1), unless the authorization is  terminated or revoked. Performed at [facility], [place] [address] [place], [place] [address]          Radiology Studies: DG Chest 2 View  Result Date: 02/02/2020 CLINICAL DATA:  Hypoxia.  Pain. EXAM: CHEST - 2 VIEW COMPARISON:  January 26, 2020 FINDINGS: Cardiomegaly. The hila and mediastinum are unchanged. No pneumothorax. No nodules or masses. Increasing interstitial opacities in the lungs. IMPRESSION: Cardiomegaly and increasing interstitial opacities in the lungs, most consistent with developing mild edema. Electronically Signed   By: Dorise Bullion III M.D   On: 02/02/2020 13:02      Scheduled Meds: . carvedilol  3.125 mg Oral BID WC  . docusate sodium  100 mg Oral BID  . famotidine  40 mg Oral Daily  . feeding supplement  237 mL Oral TID BM  . folic acid  1 mg Oral Daily  . furosemide  40 mg Intravenous BID  . multivitamin with minerals  1 tablet Oral Daily  . nicotine  14 mg Transdermal Daily  . potassium chloride  40 mEq  Oral Q4H  . sodium chloride flush  3 mL Intravenous Q12H  . thiamine  100 mg Oral Daily   Or  . thiamine  100 mg Intravenous Daily   Continuous Infusions: . sodium chloride 10 mL/hr at 02/02/20 1500  . sodium chloride 1,000 mL (02/01/20 1526)  . ampicillin-sulbactam (UNASYN) IV 1.5 g (02/03/20 0850)     LOS: 7 days      Debbe Odea, MD [facility] Pager: [email or web address] 02/03/2020, 11:30 AM

## 2020-02-04 ENCOUNTER — Telehealth: Payer: Self-pay

## 2020-02-04 DIAGNOSIS — I5021 Acute systolic (congestive) heart failure: Secondary | ICD-10-CM | POA: Diagnosis not present

## 2020-02-04 DIAGNOSIS — J9601 Acute respiratory failure with hypoxia: Secondary | ICD-10-CM | POA: Diagnosis not present

## 2020-02-04 DIAGNOSIS — W19XXXA Unspecified fall, initial encounter: Secondary | ICD-10-CM | POA: Diagnosis not present

## 2020-02-04 DIAGNOSIS — J189 Pneumonia, unspecified organism: Secondary | ICD-10-CM | POA: Diagnosis not present

## 2020-02-04 LAB — BASIC METABOLIC PANEL
Anion gap: 13 (ref 5–15)
BUN: 11 mg/dL (ref 8–23)
CO2: 24 mmol/L (ref 22–32)
Calcium: 9.2 mg/dL (ref 8.9–10.3)
Chloride: 101 mmol/L (ref 98–111)
Creatinine, Ser: 0.91 mg/dL (ref 0.61–1.24)
GFR, Estimated: >60 mL/min (ref >60)
Glucose, Bld: 103 mg/dL — ABNORMAL HIGH (ref 70–99)
Potassium: 4.7 mmol/L (ref 3.5–5.1)
Sodium: 138 mmol/L (ref 135–145)

## 2020-02-04 MED ORDER — ENSURE ENLIVE PO LIQD: 237.0000 mL | Freq: Every day | ORAL | Status: DC

## 2020-02-04 MED ORDER — FUROSEMIDE 10 MG/ML IJ SOLN
40.0000 mg | Freq: Three times a day (TID) | INTRAMUSCULAR | Status: DC
  Administered 2020-02-04 – 2020-02-05 (×3): 40 mg via INTRAVENOUS
  Filled 2020-02-04 (×3): qty 4

## 2020-02-04 NOTE — Progress Notes (Signed)
Physical Therapy Treatment Patient Details Name: Fernando [name] MRN: [id number] DOB: 1956/02/15 Today's Date: 02/04/2020    History of Present Illness Pt is a 64 y.o. M with significant PMH of CHF with reduced EF, hypertension and chronic alcohol use. Presents after a fall. Found to have right shoulder dislocation and subsequently reduced. 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     PT Comments    Patient progressing towards physical therapy goals. Upon arrival, patient on RA with spO2 at 90%. Patient ambulated 300' with RW and min guard. Monitored O2 sats during ambulation with drop to 86%, pt able to recover to 89% with pursed lip breathing. Returned to room, patient dropped to 83% while in recliner, able to recover within 30 seconds to 90% with pursed lip breathing. Educated patient on need for assistance and safety if returned home due to unsteadiness and decreased endurance, patient adamant about returning home. Patient continues to be limited by decreased activity tolerance, impaired balance, generalized weakness, and decreased awareness for safety. Continue to recommend SNF for ongoing Physical Therapy.     Follow Up Recommendations  SNF;Supervision for mobility/OOB     Equipment Recommendations  None recommended by PT    Recommendations for Other Services       Precautions / Restrictions Precautions Precautions: Fall;Other (comment) Precaution Comments: watch 02/HR Restrictions Weight Bearing Restrictions: No    Mobility  Bed Mobility Overal bed mobility:  (up in chair on arrival)                Transfers Overall transfer level: Needs assistance Equipment used: Rolling [name] (2 wheeled) Transfers: Sit to/from Stand Sit to Stand: Min assist         General transfer comment: requires minA due to LOB upon standing, pt unaware of LOB and need for assistance  Ambulation/Gait Ambulation/Gait assistance: Min guard Gait Distance (Feet): 300  Feet Assistive device: Rolling [name] (2 wheeled) Gait Pattern/deviations: Step-through pattern;Narrow base of support;Drifts right/left Gait velocity: decreased   General Gait Details: spO2 prior to ambulation 90% on RA, during ambulation spO2 dropped to 86 with no s/s, pt able to perform pursed lip breathing and bring it up to 89% while ambulating. Returned to room and sat in recliner, spO2 dropped to 83%, pt able to recover within 30 seconds to 90% with pursed lip breathing   Stairs             Wheelchair Mobility    Modified Rankin (Stroke Patients Only)       Balance Overall balance assessment: Needs assistance Sitting-balance support: Feet supported;No upper extremity supported Sitting balance-Leahy Scale: Good     Standing balance support: Bilateral upper extremity supported;During functional activity Standing balance-Leahy Scale: Poor Standing balance comment: Requires UE support in standing.                            Cognition Arousal/Alertness: Awake/alert Behavior During Therapy: WFL for tasks assessed/performed Overall Cognitive Status: Impaired/Different from baseline Area of Impairment: Safety/judgement                         Safety/Judgement: Decreased awareness of safety;Decreased awareness of deficits            Exercises      General Comments        Pertinent Vitals/Pain Pain Assessment: No/denies pain    Home Living  Prior Function            PT Goals (current goals can now be found in the care plan section) Acute Rehab PT Goals Patient Stated Goal: wanting to go home PT Goal Formulation: With patient Time For Goal Achievement: 02/09/20 Potential to Achieve Goals: Fair Progress towards PT goals: Progressing toward goals    Frequency    Min 3X/week      PT Plan Current plan remains appropriate    Co-evaluation              AM-PAC PT "6 Clicks" Mobility    Outcome Measure  Help needed turning from your back to your side while in a flat bed without using bedrails?: None Help needed moving from lying on your back to sitting on the side of a flat bed without using bedrails?: None Help needed moving to and from a bed to a chair (including a wheelchair)?: A Little Help needed standing up from a chair using your arms (e.g., wheelchair or bedside chair)?: A Little Help needed to walk in hospital room?: A Little Help needed climbing 3-5 steps with a railing? : A Lot 6 Click Score: 19    End of Session Equipment Utilized During Treatment: Gait belt Activity Tolerance: Patient tolerated treatment well Patient left: in chair;with call bell/phone within reach;with chair alarm set Nurse Communication: Mobility status PT Visit Diagnosis: Unsteadiness on feet (R26.81);Other abnormalities of gait and mobility (R26.89);History of falling (Z91.81);Difficulty in walking, not elsewhere classified (R26.2)     Time: 8937-3428 PT Time Calculation (min) (ACUTE ONLY): 25 min  Charges:  $Therapeutic Activity: 23-37 mins                     Perrin Maltese, PT, DPT Acute Rehabilitation Services Pager [phone] Office [phone]    Fernando [name] 02/04/2020, 9:27 AM

## 2020-02-04 NOTE — Telephone Encounter (Signed)
-----   Message from Irene Shipper, MD sent at 02/04/2020  8:58 AM EDT ----- Regarding: RE: Needs appointment Sure ----- Message ----- From: Algernon Huxley, RN Sent: 02/04/2020   8:24 AM EDT To: Irene Shipper, MD Subject: FW: Needs appointment                          Dr. Henrene Pastor I have scheduled this pt to see you 03/07/20 at 3:40pm. Is that ok?  Thanks, Fernando [name] ----- Message ----- From: Gatha Mayer, MD Sent: 02/03/2020   5:52 PM EDT To: Irene Shipper, MD, Algernon Huxley, RN Subject: Needs appointment                              Fernando [name],  This patient needs an appointment with Dr. Henrene Pastor or an app sometime within the next 2 to 4 weeks.  Denice Bors he has a 3.4 cm tail mass in the pancreas, he is claustrophobic and wanted an open MRI should we choose that.  I suppose EUS is also a reasonable next step.  He is getting over alcohol withdrawal and going to a skilled nursing facility.  I thought most likely best that he be seen in person before we determine the next step as far as evaluation.  Be aware that his chart is pending merge and his prior [facility] information is in under another medical record number   Patient Marked for Merge  The patient you selected has been marked for a potential merge and may include associated patients beyond the initial potential merge. The merge has not yet occurred, so data for this patient may be incomplete. This situation is currently being remedied. In the meantime you may continue your work with this record. Associated Patient - MRN Fernando, [name] [id number]    [name]

## 2020-02-04 NOTE — Progress Notes (Signed)
Upon first assessment with patient this am, he stated he wanted to sign himself out and go home. He was slightly irritated and said he was going home today, no ifs, ands, or buts. I spoke to him in detail about his frustrations, and he agreed to wait until I notified the MD and case managers, to ensure he goes home safely. He agreed, but continued to make sure I knew he was going home and not to a SNF. He has been patient and pleasant since then, though needing reassurance that he will be discharged home, likely today. He has been cooperative with all care, walk test showed improvement from yesterday with dipping oxygen sats but quick (30 sec) recovery on room air only. Assessment wise, patient continues to be A&O x 4, lungs clear all lobes, continues to have daily BM without difficulty, urinating well with use of urinal today, walking with steady gait with walker, which he has at home. Call light in reach, able to make needs known.

## 2020-02-04 NOTE — Progress Notes (Signed)
Nutrition Follow-up  DOCUMENTATION CODES:   Not applicable  INTERVENTION:   -Decrease Ensure Enlive to daily, each supplement provides 350 kcal and 20 grams of protein -Continue MVI with minerals daily  NUTRITION DIAGNOSIS:   Inadequate oral intake related to decreased appetite as evidenced by meal completion < 50%.  Ongoing  GOAL:   Patient will meet greater than or equal to 90% of their needs  Progressing   MONITOR:   PO intake, Supplement acceptance, Labs, Weight trends, Skin, I & O's  REASON FOR ASSESSMENT:   Consult Assessment of nutrition requirement/status  ASSESSMENT:   Fernando [name] is a 64 y.o. male with medical history significant of chronic systolic CHF (EF 26-37% in 10/2019);  ETOH dependence with DTs during 10/2019 admission; prosthetic shoulder joint infection; HTN; and HLD presenting after a fall.   He reports that he got up off the commode overnight, lost his balance, and hit his head with significant bleeding.  He had been drinking "a little bit" of alcohol, about as much as he usually drinks.  He reports that his R hip hurts but he is able to move it; he isn't sure he can bear weight on it.  His forehead is also sore.  10/28-s/p EGD- revealed portal hypertensive gastropathy and nodular mucosa in duodenal bulb  Reviewed I/O's: -1.9 L x 24 hours and -4.2 L since admission  UOP: 2.3 L x 24 hours  Pt sitting in recliner chair eating lunch at time of visit. He reports improved appetite and eating most of his meals that he is given. Noted meal completion 85-100%. Pt denies any difficulty chewing or swallowing food.   Pt reports he is consuming about one Ensure per day, but only likes strawberry flavor. Discussed importance of good meal and supplement intake to promote healing.   Pt eager to return home. Per TOC notes, plan to d/c home with home health services once medically stable.   Medications reviewed and include folvite, lasix, MVI, and thiamine.    Labs reviewed.   Diet Order:   Diet Order            Diet 2 gram sodium Room service appropriate? Yes; Fluid consistency: Thin  Diet effective now                 EDUCATION NEEDS:   No education needs have been identified at this time  Skin:  Skin Assessment: Skin Integrity Issues: Skin Integrity Issues:: Incisions, Other (Comment), DTI DTI: rt buttock Incisions: mid upper nose, rt upper face, rt lateral thigh Other: skin tear rt elbow  Last BM:  02/04/20  Height:   Ht Readings from Last 1 Encounters:  01/26/20 5\' 7"  (1.702 m)    Weight:   Wt Readings from Last 1 Encounters:  02/04/20 74.8 kg    Ideal Body Weight:  67.3 kg  BMI:  Body mass index is 25.83 kg/m.  Estimated Nutritional Needs:   Kcal:  2050-2250  Protein:  110-125 grams  Fluid:  > 2 L    Loistine Chance, RD, LDN, [place] Registered Dietitian II Certified Diabetes Care and Education Specialist Please refer to [facility] for RD and/or RD on-call/weekend/after hours pager

## 2020-02-04 NOTE — Progress Notes (Signed)
SATURATION QUALIFICATIONS: (This note is used to comply with regulatory documentation for home oxygen)  Patient Saturations on Room Air at Rest = 90%  Patient Saturations on Room Air while Ambulating = 86%, able to recover to 89% with pursed lip breathing  Returned to room in recliner, saturations on room air = 83%, patient able to recover to 90% with pursed lip breathing

## 2020-02-04 NOTE — TOC Transition Note (Addendum)
Transition of Care [facility]) - CM/SW Discharge Note   Patient Details  Name: RAYSHUN KANDLER MRN: [id number] Date of Birth: [birth year]  Transition of Care [facility]) CM/SW Contact:  Zenon Mayo, RN Phone Number: 02/04/2020, 10:23 AM   Clinical Narrative:    NCM was informed by Staff RN Junie Panning, that patient wants to speak with NCM, he does not want to go to SNF he wants to go home with [facility] services.  NCM offered choice, he chose [facility], NCM made referral to [facility] with [facility].  She states they should be able to take referral.  NCM notified MD for [facility] orders.   11/1Junie Panning notified NCM she could not take referral,  NCM made referral to [facility] with [facility], he is able to take referral for [place], [place], [place] and SW.    Final next level of care: [place] Barriers to Discharge: No Barriers Identified   Patient Goals and CMS Choice Patient states their goals for this hospitalization and ongoing recovery are:: wants to go home with [facility] CMS Medicare.gov Compare Post Acute Care list provided to:: Patient Choice offered to / list presented to : Patient  Discharge Placement                       Discharge Plan and Services                  DME Agency: NA       HH Arranged: RN, Disease Management, PT, Nurse's Aide, Social Work [employer] Agency: [place] ([place]) Date [place]: 02/04/20 Time [place]: [place] Representative spoke with at [place]: [place] ([place]) Interventions     Readmission Risk Interventions No flowsheet data found.

## 2020-02-04 NOTE — Progress Notes (Signed)
PROGRESS NOTE    Fernando [name]   OVF:[id number]  DOB: Jan 28, 1956  DOA: 01/26/2020 PCP: [name], MD   Brief Narrative:    HPI on 01/26/2020 by Dr. Hillard Danker [name] medical history significant ofchronic systolic CHF (EF 84-16% in 10/2019); ETOH dependence with DTs during 10/2019 admission; prosthetic shoulder joint infection; HTN; and HLD presenting after a fall.He reports that he got up off the commode overnight, lost his balance, and hit his head with significant bleeding. He had been drinking "a little bit" of alcohol, about as much as he usually drinks. He reports that his R hip hurts but he is able to move it; he isn't sure he can bear weight on it. His forehead is also sore.   In ED> 88% on room air, SBP in 50s,  Lactate 4.6 -> 2.1.   Interim history  Patient admitted with fall & hypotension.  Found to have sinus tachycardia.  Also noted to have anemia and FOBT was positive.   Began treatment for acute hypoxic respiratory failure related to pneumonia and acute systolic CHF S/p EGD.  Subjective: Wants to go home. States he feels fine.  Assessment & Plan:   Principal Problem: Anemia likely due to acute GI bleed/ hemoccult + in ED - Hb 9.2 dropping to Hb 6.9 on 10/25 and improving to 9-10 range after 1 U PRBC - now stable - complained of dark stools -10/28> EGD showed esophagitis, portal hypertensive gastropathy- Pepcid daily ordered by GI - no obvious bleeding or ongoing drop in Hb now - he also has macrocytosis which may be directly related to ETOH -  check an anemia panel to be sure he has no deficiencies> B12 & folate normal,     Active Problems: ETOH abuse and withdrawal with acute encephalopathy Marked hepatic steatosis on CT -   Withdrawal resolving  - he states he plans to quit and need no assistance - cont Thiamine and folate - SW consulted for substance abuse resources in case he changes his mind  Acute hypoxic  respiratory failure- multifactorial  (A) Interstitial lung disease/ Fibrosis on imaging (may have chronic hypoxia) with superimposed pneumonia possibly related to aspiration - T max 100.3 on 10/23 and 100.4 on 10/26-suspected aspiration- Unasyn started on 10/25- fever curve improved - Fibrosis on imaging may be suggestive of COPD- he is a chronic smoker - will need outpt PFTS - dc Unasyn today (7 day course)  (B) acute on chronic systolic CHF> -  coarse crackles on exam - CXR 10/30 consistent with increased pulmonary opacities consistent with pulm edema - ECHO 10/26> EF 25 to 30%, grade 1 d CHF- full report below - started Lasix 40 mg BID - Losartan on hold due to hypotension - cont coreg - Pulse ox in mid 80s on ambulation- increase Lasix to 40 mg TID    Hypotension with h/o Essential hypertension  - cont Coreg-  - hold Losartan for now  Sinus tachycardia - chronic per cardiology consult on 10/27  Hypokalemia and Hypomagnesemia - checking intermittently and repleting  Laceration above right eye - sutured in ED    Dislocation of prosthetic shoulder joint, initial encounter ([facility]) - occurring after fall - reduced int he ED  Fall at home - PT recommends SNF due to balance issues/ wide base gait  -B12 level normal  Acute thrombocytopenia - may be depletion in setting of acute GI bleed - platelets have normalized   Tobacco dependence - on nicotine  patch    Pancreatic mass on CT - per CT report> 3.4 cm solid and cystic pancreatic tail mass. There is some regional fat stranding and indistinguishable margins with the stomach, possibly a pseudocyst but cystic neoplasm is not exclude - will need follow up with MRI- can be done as outpatient  Time spent in minutes:30 DVT prophylaxis: enoxaparin (LOVENOX) injection 40 mg Start: 02/03/20 1230 Place and maintain sequential compression device Start: 01/28/20 1252  Code Status: Full code Family Communication: Vicky- close friend  and neighbor Disposition Plan:  Status is: Inpatient  Remains inpatient appropriate because:ongiong work up and treatment of hypoxia   Dispo: The patient is from: Home              Anticipated d/c is to: home- declining SNF now              Anticipated d/c date is: > 3 days              Patient currently is not medically stable to d/c.   Consultants:   GI  CArdiology Procedures:   none Antimicrobials:  Anti-infectives (From admission, onward)   Start     Dose/Rate Route Frequency Ordered Stop   01/28/20 1000  ampicillin-sulbactam (UNASYN) 1.5 g in sodium chloride 0.9 % 100 mL IVPB  Status:  Discontinued        1.5 g 200 mL/hr over 30 Minutes Intravenous Every 6 hours 01/28/20 0854 02/04/20 0804       Objective: Vitals:   02/04/20 0600 02/04/20 0700 02/04/20 0743 02/04/20 1143  BP:      Pulse:   (!) 101 (!) 101  Resp: 16 18  18   Temp:   98 F (36.7 C) 98.3 F (36.8 C)  TempSrc:   Oral Oral  SpO2:   93% 93%  Weight:      Height:        Intake/Output Summary (Last 24 hours) at 02/04/2020 1540 Last data filed at 02/04/2020 1300 Gross per 24 hour  Intake 440 ml  Output 2651 ml  Net -2211 ml   Filed Weights   02/02/20 0415 02/03/20 0300 02/04/20 0200  Weight: 75.7 kg 74.8 kg 74.8 kg    Examination: General exam: Appears comfortable  HEENT: PERRLA, oral mucosa moist, no sclera icterus or thrush Respiratory system: crackles at bases Cardiovascular system: S1 & S2 heard,  No murmurs  Gastrointestinal system: Abdomen soft, non-tender, nondistended. Normal bowel sounds   Central nervous system: Alert and oriented. No focal neurological deficits. Extremities: No cyanosis, clubbing or edema Skin: No rashes or ulcers Psychiatry:  Mood & affect appropriate.     Data Reviewed: I have personally reviewed following labs and imaging studies  CBC: Recent Labs  Lab 01/29/20 0756 01/30/20 0509 01/31/20 0302 02/01/20 0355 02/02/20 0633  WBC  --  6.2 6.0 5.1  --    HGB 10.5* 9.9* 9.7* 9.6* 9.7*  HCT 31.1* 29.5* 29.7* 29.7* 29.9*  MCV  --  103.9* 105.7* 106.1*  --   PLT  --  108* 142* 169  --    Basic Metabolic Panel: Recent Labs  Lab 01/31/20 0302 02/01/20 0355 02/02/20 0633 02/03/20 0921 02/04/20 0517  NA 136 135 134* 137 138  K 4.1 3.8 3.7 3.0* 4.7  CL 99 98 100 97* 101  CO2 27 27 23 26 24   GLUCOSE 112* 107* 115* 136* 103*  BUN 12 6* 9 9 11   CREATININE 0.87 0.78 0.81 0.95 0.91  CALCIUM 8.9 8.7*  8.7* 9.1 9.2  MG 1.5* 1.7 1.8 1.6*  --    GFR: Estimated Creatinine Clearance: 74.7 mL/min (by C-G formula based on SCr of 0.91 mg/dL). Liver Function Tests: No results for input(s): AST, ALT, ALKPHOS, BILITOT, PROT, ALBUMIN in the last 168 hours. No results for input(s): LIPASE, AMYLASE in the last 168 hours. No results for input(s): AMMONIA in the last 168 hours. Coagulation Profile: No results for input(s): INR, PROTIME in the last 168 hours. Cardiac Enzymes: No results for input(s): CKTOTAL, CKMB, CKMBINDEX, TROPONINI in the last 168 hours. BNP (last 3 results) No results for input(s): PROBNP in the last 8760 hours. HbA1C: No results for input(s): HGBA1C in the last 72 hours. CBG: No results for input(s): GLUCAP in the last 168 hours. Lipid Profile: No results for input(s): CHOL, HDL, LDLCALC, TRIG, CHOLHDL, LDLDIRECT in the last 72 hours. Thyroid Function Tests: No results for input(s): TSH, T4TOTAL, FREET4, T3FREE, THYROIDAB in the last 72 hours. Anemia Panel: Recent Labs    02/02/20 1623 02/03/20 0921  VITAMINB12 411  --   FOLATE  --  30.2  FERRITIN 440*  --   TIBC  --  218*  IRON  --  40*  RETICCTPCT 2.8  --    Urine analysis: No results found for: COLORURINE, APPEARANCEUR, LABSPEC, PHURINE, GLUCOSEU, HGBUR, BILIRUBINUR, KETONESUR, PROTEINUR, UROBILINOGEN, NITRITE, LEUKOCYTESUR Sepsis Labs: @LABRCNTIP (procalcitonin:4,lacticidven:4) ) Recent Results (from the past 240 hour(s))  Respiratory Panel by RT PCR (Flu A&B,  Covid) - Nasopharyngeal Swab     Status: None   Collection Time: 01/26/20  2:27 AM   Specimen: Nasopharyngeal Swab  Result Value Ref Range Status   SARS Coronavirus 2 by RT PCR NEGATIVE NEGATIVE Final    Comment: (NOTE) SARS-CoV-2 target nucleic acids are NOT DETECTED.  The SARS-CoV-2 RNA is generally detectable in upper respiratoy specimens during the acute phase of infection. The lowest concentration of SARS-CoV-2 viral copies this assay can detect is 131 copies/mL. A negative result does not preclude SARS-Cov-2 infection and should not be used as the sole basis for treatment or other patient management decisions. A negative result may occur with  improper specimen collection/handling, submission of specimen other than nasopharyngeal swab, presence of viral mutation(s) within the areas targeted by this assay, and inadequate number of viral copies (<131 copies/mL). A negative result must be combined with clinical observations, patient history, and epidemiological information. The expected result is Negative.  Fact Sheet for Patients:  PinkCheek.be  Fact Sheet for Healthcare Providers:  GravelBags.it  This test is no t yet approved or cleared by the [place] FDA and  has been authorized for detection and/or diagnosis of SARS-CoV-2 by FDA under an Emergency Use Authorization (EUA). This EUA will remain  in effect (meaning this test can be used) for the duration of the COVID-19 declaration under Section 564(b)(1) of the Act, 21 U.S.C. section 360bbb-3(b)(1), unless the authorization is terminated or revoked sooner.     Influenza A by PCR NEGATIVE NEGATIVE Final   Influenza B by PCR NEGATIVE NEGATIVE Final    Comment: (NOTE) The Xpert Xpress SARS-CoV-2/FLU/RSV assay is intended as an aid in  the diagnosis of influenza from Nasopharyngeal swab specimens and  should not be used as a sole basis for treatment. Nasal  washings and  aspirates are unacceptable for Xpert Xpress SARS-CoV-2/FLU/RSV  testing.  Fact Sheet for Patients: PinkCheek.be  Fact Sheet for Healthcare Providers: GravelBags.it  This test is not yet approved or cleared by the [place] FDA  and  has been authorized for detection and/or diagnosis of SARS-CoV-2 by  FDA under an Emergency Use Authorization (EUA). This EUA will remain  in effect (meaning this test can be used) for the duration of the  Covid-19 declaration under Section 564(b)(1) of the Act, 21  U.S.C. section 360bbb-3(b)(1), unless the authorization is  terminated or revoked. Performed at [facility], [place] [address], [place], [place] [address]          Radiology Studies: No results found.    Scheduled Meds: . carvedilol  3.125 mg Oral BID WC  . docusate sodium  100 mg Oral BID  . enoxaparin (LOVENOX) injection  40 mg Subcutaneous Q24H  . famotidine  40 mg Oral Daily  . feeding supplement  237 mL Oral TID BM  . folic acid  1 mg Oral Daily  . furosemide  40 mg Intravenous TID  . multivitamin with minerals  1 tablet Oral Daily  . nicotine  14 mg Transdermal Daily  . sodium chloride flush  3 mL Intravenous Q12H  . thiamine  100 mg Oral Daily   Or  . thiamine  100 mg Intravenous Daily   Continuous Infusions: . sodium chloride 10 mL/hr at 02/02/20 1500  . sodium chloride 1,000 mL (02/01/20 1526)     LOS: 8 days      Debbe Odea, MD [facility] Pager: [email or web address] 02/04/2020, 3:40 PM

## 2020-02-04 NOTE — Telephone Encounter (Signed)
Pt scheduled to see Dr. Henrene Pastor 03/07/20@3 :40pm. Letter mailed to pt as he is still an inpt.

## 2020-02-05 DIAGNOSIS — I5043 Acute on chronic combined systolic (congestive) and diastolic (congestive) heart failure: Secondary | ICD-10-CM

## 2020-02-05 DIAGNOSIS — K7010 Alcoholic hepatitis without ascites: Secondary | ICD-10-CM | POA: Diagnosis not present

## 2020-02-05 DIAGNOSIS — W19XXXA Unspecified fall, initial encounter: Secondary | ICD-10-CM | POA: Diagnosis not present

## 2020-02-05 DIAGNOSIS — N179 Acute kidney failure, unspecified: Secondary | ICD-10-CM | POA: Diagnosis not present

## 2020-02-05 LAB — BASIC METABOLIC PANEL
Anion gap: 13 (ref 5–15)
BUN: 13 mg/dL (ref 8–23)
CO2: 27 mmol/L (ref 22–32)
Calcium: 9.3 mg/dL (ref 8.9–10.3)
Chloride: 97 mmol/L — ABNORMAL LOW (ref 98–111)
Creatinine, Ser: 1.06 mg/dL (ref 0.61–1.24)
GFR, Estimated: >60 mL/min (ref >60)
Glucose, Bld: 99 mg/dL (ref 70–99)
Potassium: 3.6 mmol/L (ref 3.5–5.1)
Sodium: 137 mmol/L (ref 135–145)

## 2020-02-05 LAB — MAGNESIUM: Magnesium: 1.6 mg/dL — ABNORMAL LOW (ref 1.7–2.4)

## 2020-02-05 MED ORDER — THIAMINE HCL 100 MG PO TABS
100.0000 mg | ORAL_TABLET | Freq: Every day | ORAL | 0 refills | Status: DC
Start: 2020-02-06 — End: 2020-02-18

## 2020-02-05 MED ORDER — FUROSEMIDE 40 MG PO TABS: 40.0000 mg | ORAL_TABLET | Freq: Every day | ORAL | 11 refills | Status: DC

## 2020-02-05 MED ORDER — FAMOTIDINE 40 MG PO TABS
40.0000 mg | ORAL_TABLET | Freq: Every day | ORAL | 0 refills | Status: DC
Start: 2020-02-06 — End: 2020-02-05

## 2020-02-05 MED ORDER — FAMOTIDINE 40 MG PO TABS
40.0000 mg | ORAL_TABLET | Freq: Every day | ORAL | 0 refills | Status: DC
Start: 2020-02-06 — End: 2020-08-29

## 2020-02-05 MED ORDER — MAGNESIUM SULFATE 4 GM/100ML IV SOLN
4.0000 g | Freq: Once | INTRAVENOUS | Status: AC
  Administered 2020-02-05: 4 g via INTRAVENOUS
  Filled 2020-02-05: qty 100

## 2020-02-05 MED ORDER — NICOTINE 7 MG/24HR TD PT24
7.0000 mg | MEDICATED_PATCH | Freq: Every day | TRANSDERMAL | 0 refills | Status: DC
Start: 2020-02-06 — End: 2020-02-05

## 2020-02-05 MED ORDER — ALBUTEROL SULFATE (2.5 MG/3ML) 0.083% IN NEBU: 2.5000 mg | INHALATION_SOLUTION | Freq: Four times a day (QID) | RESPIRATORY_TRACT | 12 refills | Status: DC | PRN

## 2020-02-05 MED ORDER — THIAMINE HCL 100 MG PO TABS
100.0000 mg | ORAL_TABLET | Freq: Every day | ORAL | 0 refills | Status: DC
Start: 2020-02-06 — End: 2020-02-05

## 2020-02-05 MED ORDER — POTASSIUM CHLORIDE ER 10 MEQ PO TBCR
20.0000 meq | EXTENDED_RELEASE_TABLET | Freq: Every day | ORAL | 0 refills | Status: DC
Start: 2020-02-05 — End: 2020-04-21

## 2020-02-05 MED ORDER — NICOTINE 7 MG/24HR TD PT24
7.0000 mg | MEDICATED_PATCH | Freq: Every day | TRANSDERMAL | Status: DC
  Administered 2020-02-05: 7 mg via TRANSDERMAL
  Filled 2020-02-05: qty 1

## 2020-02-05 MED ORDER — NICOTINE 7 MG/24HR TD PT24
7.0000 mg | MEDICATED_PATCH | Freq: Every day | TRANSDERMAL | 0 refills | Status: DC
Start: 2020-02-06 — End: 2020-02-18

## 2020-02-05 MED ORDER — POTASSIUM CHLORIDE ER 10 MEQ PO TBCR: 20.0000 meq | EXTENDED_RELEASE_TABLET | Freq: Every day | ORAL | 0 refills | Status: DC

## 2020-02-05 NOTE — Progress Notes (Signed)
Called pharmacy to notify both pyxis machines are empty of Nicoderm 7g patch. They stated they would be tubing some up to the unit.

## 2020-02-05 NOTE — Discharge Summary (Addendum)
Physician Discharge Summary  Fernando [name] WER:[id number] DOB: [birth year] DOA: 01/26/2020  PCP: [name], MD  Admit date: 01/26/2020 Discharge date: 02/05/2020  Admitted From: home Disposition:  home   Recommendations for Outpatient Follow-up:  1. Continue CHF education and continue to encourage abstinence form alcohol 2. Obtain Bmet on Monday (new start on Lasix and K)- will ask HHRN to draw 3. F/u Pancreatic mass  Home Health:  ordered Discharge Condition:  stable   CODE STATUS:  Full code   Diet recommendation:  Heart heatlhy Consultations:  cardiology   GI Procedures/Studies: . ECHO   Discharge Diagnoses:  Principal Problem:   Fall at home, initial encounter Active Problems:   Dislocation of prosthetic shoulder joint, initial encounter ([place])   Acute Chronic systolic (congestive) heart failure (HCC)   Alcohol dependence (HCC)   Hypotension   Tobacco dependence   Sinus tachycardia   Alcoholic hepatitis without ascites   Esophagitis, Portal hypertensive gastropathy (HCC)   Pancreatic mass   Acute blood loss anemia with Anemia of chronic disease   Dyslipidemia    Brief Summary: HPI on 01/26/2020 by Dr. Hillard Danker [name] medical history significant ofchronic systolic and diastolic CHF (EF 19-50% in 10/2019); ETOH dependence with DTs during 10/2019 admission; prosthetic shoulder joint infection; HTN; and HLD presenting after a fall.He reports that he got up off the commode overnight, lost his balance, and hit his head with significant bleeding. He had been drinking "a little bit" of alcohol, about as much as he usually drinks. He reports that his R hip hurts but he is able to move it; he isn't sure he can bear weight on it. His forehead is also sore.   In ED> 88% on room air, SBP in 50s,Lactate 4.6 ->2.1.   I became his attending on 10/30.   Hospital Course:  Principal Problem: Anemia likely due to acute GI  bleed/ hemoccult + in ED AKI on admission - complained of dark stools - AKI (Cr 1.32) noted and likely due to acute volume loss (baseline ~ 0.7-1.0) - Hb 9.2 dropping to Hb 6.9 by 10/25 and improving to 9-10 range after 1 U PRBC - now stable -10/28> EGDshowed esophagitis, portal hypertensive gastropathy- Pepcid daily ordered by GI - no obvious bleeding or ongoing drop in Hb now - he also has macrocytosis which may be directly related to ETOH - B12 & folate normal,     Active Problems: ETOH abuse and withdrawal with acute encephalopathy Marked hepatic steatosis on CT - daily alcohol abuse -   Withdrawal resolved - cont Thiamine and folate - he states he plans to quit and needs no assistance - SW consulted for substance abuse resources in case he changes his mind     Fall and Dislocation of prosthetic shoulder joint, initial encounter ([place]) - occurring after fall - reduced int he ED - PT recommended SNF due to balance issues/ wide base gait - his gait has improved significantly- no longer unsteady- PT no longer recommending SNF but recommend HH which I have ordered -B12 level normal   Acute hypoxic respiratory failure- multifactorial  (A) Interstitial lung disease/ Fibrosis on imaging  - superimposed pneumonia possibly related to aspiration - T max 100.3 on 10/23 and 100.4 on 10/26-suspected aspiration- Unasyn started on 10/25- fever curve improved - Fibrosis on imaging may be suggestive of COPD- he is a chronic smoker - will need outpt PFTS- rescue inhaler added for now if he is wheezing -  dc'd Unasyn 11/1 (7 day course)  (B) acute on chronic systolic an mild diastolic CHF - EF 10-17% with grade 1 d CHF- see ECHO below- likely related to alcohol  -  coarse crackles on exam - obtained CXR 10/30 confirmed  increased pulmonary opacities consistent with pulm edema - ECHO 10/26> EF 25 to 30%, grade 1 d CHF- full report below - started Lasix IV on 10/30 - cont coreg - Pulse ox  was still in mid 80s on ambulation- increased Lasix to 40 mg TID - as of today, he is no longer hypoxic on exertion with pulse > 90% - crackles significantly improved - will need to d/c home with lasix and K- will need close f/u of fluid status - CHF education given    Hypotension   - BP in low 100s  - cont Coreg-   - can resume Losartan - he was on 12.5 at home - adding Lasix as mentioned above  Sinus tachycardia - chronic per cardiology consult on 10/27  Hypokalemia and Hypomagnesemia - have been checking intermittently and repleting  Laceration above right eye - sutured in ED  Acute thrombocytopenia - may be depletion in setting of acute GI bleed - platelets have normalized   Tobacco dependence - on nicotine patch - cessation counseling given especially in light of possible COPD    Pancreatic mass on CT - per CT report> 3.4 cm solid and cystic pancreatic tail mass. There is some regional fat stranding and indistinguishable margins with the stomach, possibly a pseudocyst but cystic neoplasm is not exclude - will need follow up with MRI- can be done as outpatient- he spoke with Dr Carlean Purl about claustrophobia and may need an open MRI   Discharge Exam: Vitals:   02/05/20 0733 02/05/20 0737  BP:  103/68  Pulse: (!) 106 (!) 101  Resp:    Temp: 98.2 F (36.8 C)   SpO2: 93%    Vitals:   02/04/20 2118 02/05/20 0513 02/05/20 0733 02/05/20 0737  BP: (!) 115/103 101/68  103/68  Pulse: 100 100 (!) 106 (!) 101  Resp:  15    Temp: 98 F (36.7 C) 98.4 F (36.9 C) 98.2 F (36.8 C)   TempSrc: Oral Oral Oral   SpO2: 93% 93% 93%   Weight:  73.1 kg    Height:        General: Pt is alert, awake, not in acute distress Cardiovascular: RRR, S1/S2 +, no rubs, no gallops Respiratory: CTA bilaterally, no wheezing, no rhonchi Abdominal: Soft, NT, ND, bowel sounds + Extremities: no edema, no cyanosis   Discharge Instructions  Discharge Instructions    (HEART  FAILURE PATIENTS) Call MD:  Anytime you have any of the following symptoms: 1) 3 pound weight gain in 24 hours or 5 pounds in 1 week 2) shortness of breath, with or without a dry hacking cough 3) swelling in the hands, feet or stomach 4) if you have to sleep on extra pillows at night in order to breathe.   Complete by: As directed    AMB referral to CHF clinic   Complete by: As directed    Diet - low sodium heart healthy   Complete by: As directed    Increase activity slowly   Complete by: As directed    No wound care   Complete by: As directed      Allergies as of 02/05/2020   No Known Allergies     Medication List    TAKE  these medications   albuterol (2.5 MG/3ML) 0.083% nebulizer solution Commonly known as: PROVENTIL Take 3 mLs (2.5 mg total) by nebulization every 6 (six) hours as needed for wheezing.   carvedilol 3.125 MG tablet Commonly known as: COREG Take 3.125 mg by mouth 2 (two) times daily.   famotidine 40 MG tablet Commonly known as: PEPCID Take 1 tablet (40 mg total) by mouth daily. Start taking on: February 05, 3789   folic acid 1 MG tablet Commonly known as: FOLVITE Take 1 mg by mouth daily.   furosemide 40 MG tablet Commonly known as: Lasix Take 1 tablet (40 mg total) by mouth daily.   losartan 25 MG tablet Commonly known as: COZAAR Take 12.5 mg by mouth daily.   multivitamin with minerals tablet Take 1 tablet by mouth daily.   nicotine 7 mg/24hr patch Commonly known as: NICODERM CQ - dosed in mg/24 hr Place 1 patch (7 mg total) onto the skin daily. Start taking on: February 06, 2020   potassium chloride 10 MEQ tablet Commonly known as: KLOR-CON Take 2 tablets (20 mEq total) by mouth daily.   thiamine 100 MG tablet Take 1 tablet (100 mg total) by mouth daily. Start taking on: February 06, 2020       Follow-up Information    [facility] Follow up.   Specialty: Home Health Services Why: HHRN,HHPT, HHAIDE, Social Worker Contact  information: [address] [place] [address] [phone]        [name], MD. Schedule an appointment as soon as possible for a visit in 1 week(s).   Specialty: Internal Medicine Contact information: [place] [address] [phone]              No Known Allergies    DG Chest 2 View  Result Date: 02/02/2020 CLINICAL DATA:  Hypoxia.  Pain. EXAM: CHEST - 2 VIEW COMPARISON:  January 26, 2020 FINDINGS: Cardiomegaly. The hila and mediastinum are unchanged. No pneumothorax. No nodules or masses. Increasing interstitial opacities in the lungs. IMPRESSION: Cardiomegaly and increasing interstitial opacities in the lungs, most consistent with developing mild edema. Electronically Signed   By: Dorise Bullion III M.D   On: 02/02/2020 13:02   DG Shoulder Right  Result Date: 01/26/2020 CLINICAL DATA:  Fall, RIGHT shoulder pain. EXAM: RIGHT SHOULDER - 2+ VIEW COMPARISON:  RIGHT shoulder plain film dated 06/21/2018. Chest x-ray dated 10/24/2019. FINDINGS: Superior dislocation, and at least mild anterior subluxation, of the humeral component of the RIGHT shoulder arthroplasty hardware relative to the glenoid component. Osseous irregularity along the inferior margin of the glenoid labrum, most likely chronic spurring or chronic soft tissue calcification related to the previous surgery. No convincing evidence of acute osseous fracture. IMPRESSION: 1. Superior-anterior dislocation of the humeral component of the RIGHT shoulder arthroplasty hardware relative to the glenoid component. 2. No convincing evidence of acute osseous fracture. Chronic appearing osseous spurring and/or chronic soft tissue calcifications underlying the glenohumeral joint space. Electronically Signed   By: Franki Cabot M.D.   On: 01/26/2020 05:11   DG Wrist Complete Right  Result Date: 01/26/2020 CLINICAL DATA:  Fall EXAM: RIGHT WRIST - COMPLETE 3+ VIEW COMPARISON:  None. FINDINGS:  There is no evidence of fracture or dislocation. There is diffuse osteopenia. Degenerative changes seen in the radiocarpal row. There is no evidence of arthropathy or other focal bone abnormality. Scattered vascular calcifications are noted. IMPRESSION: No acute osseous abnormality. Electronically Signed   By: Ebony Cargo.D.  On: 01/26/2020 03:15   CT Head Wo Contrast  Result Date: 01/26/2020 CLINICAL DATA:  Fall EXAM: CT HEAD WITHOUT CONTRAST CT MAXILLOFACIAL WITHOUT CONTRAST CT CERVICAL SPINE WITHOUT CONTRAST TECHNIQUE: Multidetector CT imaging of the head, cervical spine, and maxillofacial structures were performed using the standard protocol without intravenous contrast. Multiplanar CT image reconstructions of the cervical spine and maxillofacial structures were also generated. COMPARISON:  None. FINDINGS: CT HEAD FINDINGS Brain: There is no mass, hemorrhage or extra-axial collection. The size and configuration of the ventricles and extra-axial CSF spaces are normal. There is hypoattenuation of the periventricular white matter, most commonly indicating chronic ischemic microangiopathy. Vascular: Atherosclerotic calcification of the vertebral and internal carotid arteries at the skull base. No abnormal hyperdensity of the major intracranial arteries or dural venous sinuses. Skull: Right frontal scalp hematoma.  No skull fracture. CT MAXILLOFACIAL FINDINGS Osseous: --Complex facial fracture types: No LeFort, zygomaticomaxillary complex or nasoorbitoethmoidal fracture. --Simple fracture types: None. --Mandible: No fracture or dislocation. Orbits: The globes are intact. Normal appearance of the intra- and extraconal fat. Symmetric extraocular muscles and optic nerves. Sinuses: Right maxillary retention cysts. Soft tissues: Normal visualized extracranial soft tissues. CT CERVICAL SPINE FINDINGS Alignment: Grade 1 anterolisthesis at C4-5 Skull base and vertebrae: No acute fracture. Soft tissues and spinal  canal: No prevertebral fluid or swelling. No visible canal hematoma. 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 Facet arthrosis is worst at left C4-5 as well. Upper chest: No pneumothorax, pulmonary nodule or pleural effusion. Other: Normal visualized paraspinal cervical soft tissues. IMPRESSION: 1. Chronic ischemic microangiopathy without acute intracranial abnormality. 2. Right frontal scalp hematoma without skull fracture or facial fracture. 3. No acute fracture or static subluxation of the cervical spine. 4. 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 Electronically Signed   By: Ulyses Jarred M.D.   On: 01/26/2020 03:38   CT Chest W Contrast  Result Date: 01/26/2020 CLINICAL DATA:  Chest trauma, moderate to severe.  Fall in bathroom EXAM: CT CHEST, ABDOMEN, AND PELVIS WITH CONTRAST TECHNIQUE: Multidetector CT imaging of the chest, abdomen and pelvis was performed following the standard protocol during bolus administration of intravenous contrast. CONTRAST:  145mL OMNIPAQUE IOHEXOL 300 MG/ML  SOLN COMPARISON:  Chest CTA 10/25/2019 FINDINGS: CT CHEST FINDINGS Cardiovascular: Normal heart size. No pericardial effusion. Coronary atherosclerosis. No evidence of great vessel injury Mediastinum/Nodes: No pneumomediastinum or hematoma Lungs/Pleura: Streaky opacity from atelectasis. Given similar appearing reticulation on prior study there may also be interstitial lung disease. No honeycombing. No pleural effusion. Musculoskeletal: Right glenohumeral arthroplasty with joint effusion. Diffuse degenerative thoracic endplate spurring. Remote lower and posterior right rib fractures. No acute fracture or erosion. CT ABDOMEN PELVIS FINDINGS Hepatobiliary: Marked hepatic steatosis. Pancreas: Solid and cystic mass measuring 3.4 cm at the tail, projecting superiorly. Mild adjacent fat stranding and no distinguishable plane between the mass in the stomach. Closely neighboring splenic artery without signs of pseudoaneurysm.  Spleen: No splenic injury or perisplenic hematoma. Adrenals/Urinary Tract: No adrenal hemorrhage or renal injury identified. Bladder is unremarkable. 2.5 cm right renal cyst with simple CT appearance Stomach/Bowel: No evidence of injury. Indistinguishable greater curvature stomach and splenic tail mass, as above. Vascular/Lymphatic: Diffuse atheromatous calcification. Reproductive: Negative Other: No ascites or pneumoperitoneum Musculoskeletal: Soft tissue contusion posterior and lateral to the left hip. No acute fracture. Advanced lumbar spine degeneration. IMPRESSION: 1. Probable contusion posterior to the left hip. No acute fracture or other specific traumatic finding. 2. 3.4 cm solid and cystic pancreatic tail mass. There is some regional fat stranding  and indistinguishable margins with the stomach, possibly a pseudocyst but cystic neoplasm is not excluded. Recommend lab workup and short-term follow-up imaging. 3. Interstitial lung disease and atelectasis. 4. Marked hepatic steatosis. Electronically Signed   By: Monte Fantasia M.D.   On: 01/26/2020 07:21   CT Cervical Spine Wo Contrast  Result Date: 01/26/2020 CLINICAL DATA:  Fall EXAM: CT HEAD WITHOUT CONTRAST CT MAXILLOFACIAL WITHOUT CONTRAST CT CERVICAL SPINE WITHOUT CONTRAST TECHNIQUE: Multidetector CT imaging of the head, cervical spine, and maxillofacial structures were performed using the standard protocol without intravenous contrast. Multiplanar CT image reconstructions of the cervical spine and maxillofacial structures were also generated. COMPARISON:  None. FINDINGS: CT HEAD FINDINGS Brain: There is no mass, hemorrhage or extra-axial collection. The size and configuration of the ventricles and extra-axial CSF spaces are normal. There is hypoattenuation of the periventricular white matter, most commonly indicating chronic ischemic microangiopathy. Vascular: Atherosclerotic calcification of the vertebral and internal carotid arteries at the skull  base. No abnormal hyperdensity of the major intracranial arteries or dural venous sinuses. Skull: Right frontal scalp hematoma.  No skull fracture. CT MAXILLOFACIAL FINDINGS Osseous: --Complex facial fracture types: No LeFort, zygomaticomaxillary complex or nasoorbitoethmoidal fracture. --Simple fracture types: None. --Mandible: No fracture or dislocation. Orbits: The globes are intact. Normal appearance of the intra- and extraconal fat. Symmetric extraocular muscles and optic nerves. Sinuses: Right maxillary retention cysts. Soft tissues: Normal visualized extracranial soft tissues. CT CERVICAL SPINE FINDINGS Alignment: Grade 1 anterolisthesis at C4-5 Skull base and vertebrae: No acute fracture. Soft tissues and spinal canal: No prevertebral fluid or swelling. No visible canal hematoma. 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 Facet arthrosis is worst at left C4-5 as well. Upper chest: No pneumothorax, pulmonary nodule or pleural effusion. Other: Normal visualized paraspinal cervical soft tissues. IMPRESSION: 1. Chronic ischemic microangiopathy without acute intracranial abnormality. 2. Right frontal scalp hematoma without skull fracture or facial fracture. 3. No acute fracture or static subluxation of the cervical spine. 4. 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 Electronically Signed   By: Ulyses Jarred M.D.   On: 01/26/2020 03:38   CT ABDOMEN PELVIS W CONTRAST  Result Date: 01/26/2020 CLINICAL DATA:  Chest trauma, moderate to severe.  Fall in bathroom EXAM: CT CHEST, ABDOMEN, AND PELVIS WITH CONTRAST TECHNIQUE: Multidetector CT imaging of the chest, abdomen and pelvis was performed following the standard protocol during bolus administration of intravenous contrast. CONTRAST:  125mL OMNIPAQUE IOHEXOL 300 MG/ML  SOLN COMPARISON:  Chest CTA 10/25/2019 FINDINGS: CT CHEST FINDINGS Cardiovascular: Normal heart size. No pericardial effusion. Coronary atherosclerosis. No evidence of great vessel injury  Mediastinum/Nodes: No pneumomediastinum or hematoma Lungs/Pleura: Streaky opacity from atelectasis. Given similar appearing reticulation on prior study there may also be interstitial lung disease. No honeycombing. No pleural effusion. Musculoskeletal: Right glenohumeral arthroplasty with joint effusion. Diffuse degenerative thoracic endplate spurring. Remote lower and posterior right rib fractures. No acute fracture or erosion. CT ABDOMEN PELVIS FINDINGS Hepatobiliary: Marked hepatic steatosis. Pancreas: Solid and cystic mass measuring 3.4 cm at the tail, projecting superiorly. Mild adjacent fat stranding and no distinguishable plane between the mass in the stomach. Closely neighboring splenic artery without signs of pseudoaneurysm. Spleen: No splenic injury or perisplenic hematoma. Adrenals/Urinary Tract: No adrenal hemorrhage or renal injury identified. Bladder is unremarkable. 2.5 cm right renal cyst with simple CT appearance Stomach/Bowel: No evidence of injury. Indistinguishable greater curvature stomach and splenic tail mass, as above. Vascular/Lymphatic: Diffuse atheromatous calcification. Reproductive: Negative Other: No ascites or pneumoperitoneum Musculoskeletal: Soft tissue contusion posterior  and lateral to the left hip. No acute fracture. Advanced lumbar spine degeneration. IMPRESSION: 1. Probable contusion posterior to the left hip. No acute fracture or other specific traumatic finding. 2. 3.4 cm solid and cystic pancreatic tail mass. There is some regional fat stranding and indistinguishable margins with the stomach, possibly a pseudocyst but cystic neoplasm is not excluded. Recommend lab workup and short-term follow-up imaging. 3. Interstitial lung disease and atelectasis. 4. Marked hepatic steatosis. Electronically Signed   By: Monte Fantasia M.D.   On: 01/26/2020 07:21   DG Chest Port 1 View  Result Date: 01/26/2020 CLINICAL DATA:  Fall.  Level 2 trauma. EXAM: PORTABLE CHEST 1 VIEW  COMPARISON:  12/14/2019 FINDINGS: Shallow inspiration. Linear atelectasis in the lung bases. No airspace disease or consolidation. Probable emphysematous changes in the lungs. Peribronchial thickening and interstitial changes likely representing chronic bronchitis. No pleural effusions. No pneumothorax. Mediastinal contours appear intact. Normal heart size. Postoperative changes in the right shoulder. Degenerative changes in the spine. IMPRESSION: Shallow inspiration with linear atelectasis in the lung bases. Emphysematous changes and fibrosis in the lungs, likely chronic bronchitis. Electronically Signed   By: Lucienne Capers M.D.   On: 01/26/2020 02:35   DG Shoulder Right Port  Result Date: 01/26/2020 CLINICAL DATA:  Post reduction EXAM: PORTABLE RIGHT SHOULDER COMPARISON:  Plain film of the RIGHT shoulder from earlier same day. FINDINGS: RIGHT shoulder arthroplasty hardware now appears appropriately aligned. No evidence of acute osseous fracture or dislocation. IMPRESSION: Successful reduction of the RIGHT shoulder arthroplasty. Electronically Signed   By: Franki Cabot M.D.   On: 01/26/2020 06:36   ECHOCARDIOGRAM COMPLETE  Result Date: 01/29/2020    ECHOCARDIOGRAM REPORT   Patient Name:   Fernando [name] Date of Exam: 01/29/2020 Medical Rec #:  [id number]      Height:       67.0 in Accession #:    [id number]     Weight:       169.0 lb Date of Birth:  21-Feb-1956      BSA:          1.883 m Patient Age:    19 years       BP:           80/56 mmHg Patient Gender: M              HR:           109 bpm. Exam Location:  Inpatient Procedure: 2D Echo, Color Doppler, Cardiac Doppler and Intracardiac            Opacification Agent Indications:    Dyspnea 786.09 / R06.00  History:        Patient has no prior history of Echocardiogram examinations.                 CHF; Risk Factors:Hypertension, Dyslipidemia and ETOH.  Sonographer:    Bernadene Person RDCS Referring Phys: [place]  1. Cannot  exclude apicoseptal mural thrombus. Left ventricular ejection fraction, by estimation, is 25 to 30%. The left ventricle has severely decreased function. The left ventricle demonstrates global hypokinesis. Left ventricular diastolic parameters are consistent with Grade I diastolic dysfunction (impaired relaxation).  2. Right ventricular systolic function is mildly reduced. The right ventricular size is moderately enlarged. Tricuspid regurgitation signal is inadequate for assessing PA pressure.  3. The mitral valve is normal in structure. No evidence of mitral valve regurgitation.  4. The aortic valve is normal in structure. Aortic valve regurgitation  is not visualized. No aortic stenosis is present.  5. Aortic dilatation noted. There is mild dilatation of the aortic root, measuring 39 mm. Comparison(s): No prior Echocardiogram. Conclusion(s)/Recommendation(s): Unable to exclude left ventricular thrombus, would recommend a repeat transthoracic echocardiogram with contrast. Repeat limited imaging with Definity contrast to exclude left ventricular apical thrombus. FINDINGS  Left Ventricle: Cannot exclude apicoseptal mural thrombus. Left ventricular ejection fraction, by estimation, is 25 to 30%. The left ventricle has severely decreased function. The left ventricle demonstrates global hypokinesis. Definity contrast agent was given IV to delineate the left ventricular endocardial borders. The left ventricular internal cavity size was normal in size. There is no left ventricular hypertrophy. Left ventricular diastolic parameters are consistent with Grade I diastolic dysfunction (impaired relaxation). Normal left ventricular filling pressure. Right Ventricle: The right ventricular size is moderately enlarged. No increase in right ventricular wall thickness. Right ventricular systolic function is mildly reduced. Tricuspid regurgitation signal is inadequate for assessing PA pressure. Left Atrium: Left atrial size was normal  in size. Right Atrium: Right atrial size was normal in size. Pericardium: There is no evidence of pericardial effusion. Mitral Valve: The mitral valve is normal in structure. No evidence of mitral valve regurgitation. Tricuspid Valve: The tricuspid valve is normal in structure. Tricuspid valve regurgitation is trivial. Aortic Valve: The aortic valve is normal in structure. Aortic valve regurgitation is not visualized. No aortic stenosis is present. Pulmonic Valve: The pulmonic valve was not well visualized. Pulmonic valve regurgitation is not visualized. Aorta: Aortic dilatation noted. There is mild dilatation of the aortic root, measuring 39 mm. IAS/Shunts: No atrial level shunt detected by color flow Doppler.  LEFT VENTRICLE PLAX 2D LVIDd:         4.40 cm      Diastology LVIDs:         4.00 cm      LV e' medial:    4.46 cm/s LV PW:         1.00 cm      LV E/e' medial:  9.7 LV IVS:        1.00 cm      LV e' lateral:   4.51 cm/s LVOT diam:     2.30 cm      LV E/e' lateral: 9.6 LV SV:         50 LV SV Index:   26 LVOT Area:     4.15 cm  LV Volumes (MOD) LV vol d, MOD A4C: 105.0 ml LV vol s, MOD A4C: 58.8 ml LV SV MOD A4C:     105.0 ml RIGHT VENTRICLE RV S prime:     11.40 cm/s TAPSE (M-mode): 1.5 cm LEFT ATRIUM             Index       RIGHT ATRIUM           Index LA diam:        2.90 cm 1.54 cm/m  RA Area:     12.90 cm LA Vol (A2C):   56.1 ml 29.80 ml/m RA Volume:   31.60 ml  16.79 ml/m LA Vol (A4C):   30.0 ml 15.94 ml/m LA Biplane Vol: 42.4 ml 22.52 ml/m  AORTIC VALVE LVOT Vmax:   84.10 cm/s LVOT Vmean:  56.300 cm/s LVOT VTI:    0.120 m  AORTA Ao Root diam: 3.90 cm Ao Asc diam:  3.10 cm MITRAL VALVE MV Area (PHT): 1.97 cm    SHUNTS MV Decel Time: 386 msec  Systemic VTI:  0.12 m MV E velocity: 43.20 cm/s  Systemic Diam: 2.30 cm MV A velocity: 83.40 cm/s MV E/A ratio:  0.52 Mihai Croitoru MD Electronically signed by Sanda Klein MD Signature Date/Time: 01/29/2020/5:23:49 PM    Final    CT Maxillofacial  Wo Contrast  Result Date: 01/26/2020 CLINICAL DATA:  Fall EXAM: CT HEAD WITHOUT CONTRAST CT MAXILLOFACIAL WITHOUT CONTRAST CT CERVICAL SPINE WITHOUT CONTRAST TECHNIQUE: Multidetector CT imaging of the head, cervical spine, and maxillofacial structures were performed using the standard protocol without intravenous contrast. Multiplanar CT image reconstructions of the cervical spine and maxillofacial structures were also generated. COMPARISON:  None. FINDINGS: CT HEAD FINDINGS Brain: There is no mass, hemorrhage or extra-axial collection. The size and configuration of the ventricles and extra-axial CSF spaces are normal. There is hypoattenuation of the periventricular white matter, most commonly indicating chronic ischemic microangiopathy. Vascular: Atherosclerotic calcification of the vertebral and internal carotid arteries at the skull base. No abnormal hyperdensity of the major intracranial arteries or dural venous sinuses. Skull: Right frontal scalp hematoma.  No skull fracture. CT MAXILLOFACIAL FINDINGS Osseous: --Complex facial fracture types: No [place], zygomaticomaxillary complex or nasoorbitoethmoidal fracture. --Simple fracture types: None. --Mandible: No fracture or dislocation. Orbits: The globes are intact. Normal appearance of the intra- and extraconal fat. Symmetric extraocular muscles and optic nerves. Sinuses: Right maxillary retention cysts. Soft tissues: Normal visualized extracranial soft tissues. CT CERVICAL SPINE FINDINGS Alignment: Grade 1 anterolisthesis at C4-5 Skull base and vertebrae: No acute fracture. Soft tissues and spinal canal: No prevertebral fluid or swelling. No visible canal hematoma. 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 Facet arthrosis is worst at left C4-5 as well. Upper chest: No pneumothorax, pulmonary nodule or pleural effusion. Other: Normal visualized paraspinal cervical soft tissues. IMPRESSION: 1. Chronic ischemic microangiopathy without acute intracranial  abnormality. 2. Right frontal scalp hematoma without skull fracture or facial fracture. 3. No acute fracture or static subluxation of the cervical spine. 4. 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 Electronically Signed   By: Ulyses Jarred M.D.   On: 01/26/2020 03:38     The results of significant diagnostics from this hospitalization (including imaging, microbiology, ancillary and laboratory) are listed below for reference.     Microbiology: No results found for this or any previous visit (from the past 240 hour(s)).   Labs: BNP (last 3 results) No results for input(s): BNP in the last 8760 hours. Basic Metabolic Panel: Recent Labs  Lab 01/31/20 0302 01/31/20 0302 02/01/20 0355 02/02/20 9485 02/03/20 0921 02/04/20 0517 02/05/20 0326  NA 136   < > 135 134* 137 138 137  K 4.1   < > 3.8 3.7 3.0* 4.7 3.6  CL 99   < > 98 100 97* 101 97*  CO2 27   < > 27 23 26 24 27   GLUCOSE 112*   < > 107* 115* 136* 103* 99  BUN 12   < > 6* 9 9 11 13   CREATININE 0.87   < > 0.78 0.81 0.95 0.91 1.06  CALCIUM 8.9   < > 8.7* 8.7* 9.1 9.2 9.3  MG 1.5*  --  1.7 1.8 1.6*  --  1.6*   < > = values in this interval not displayed.   Liver Function Tests: No results for input(s): AST, ALT, ALKPHOS, BILITOT, PROT, ALBUMIN in the last 168 hours. No results for input(s): LIPASE, AMYLASE in the last 168 hours. No results for input(s): AMMONIA in the last 168 hours. CBC: Recent Labs  Lab 01/30/20 0509 01/31/20 0302 02/01/20 0355 02/02/20 0633  WBC 6.2 6.0 5.1  --   HGB 9.9* 9.7* 9.6* 9.7*  HCT 29.5* 29.7* 29.7* 29.9*  MCV 103.9* 105.7* 106.1*  --   PLT 108* 142* 169  --    Cardiac Enzymes: No results for input(s): CKTOTAL, CKMB, CKMBINDEX, TROPONINI in the last 168 hours. BNP: Invalid input(s): POCBNP CBG: No results for input(s): GLUCAP in the last 168 hours. D-Dimer No results for input(s): DDIMER in the last 72 hours. Hgb A1c No results for input(s): HGBA1C in the last 72 hours. Lipid  Profile No results for input(s): CHOL, HDL, LDLCALC, TRIG, CHOLHDL, LDLDIRECT in the last 72 hours. Thyroid function studies No results for input(s): TSH, T4TOTAL, T3FREE, THYROIDAB in the last 72 hours.  Invalid input(s): FREET3 Anemia work up Recent Labs    02/02/20 1623 02/03/20 0921  VITAMINB12 411  --   FOLATE  --  30.2  FERRITIN 440*  --   TIBC  --  218*  IRON  --  40*  RETICCTPCT 2.8  --    Urinalysis No results found for: COLORURINE, APPEARANCEUR, LABSPEC, [place], GLUCOSEU, HGBUR, BILIRUBINUR, KETONESUR, PROTEINUR, UROBILINOGEN, NITRITE, LEUKOCYTESUR Sepsis Labs Invalid input(s): PROCALCITONIN,  WBC,  LACTICIDVEN Microbiology No results found for this or any previous visit (from the past 240 hour(s)).   Time coordinating discharge in minutes: 65  SIGNED:   Debbe Odea, MD  [facility] 02/05/2020, 10:34 AM

## 2020-02-05 NOTE — Plan of Care (Signed)

## 2020-02-05 NOTE — Progress Notes (Signed)
D/C instructions given and reviewed. Pt requested new medications be sent to [facility] on [address] in [place], [place]. MD notified. Encouraged pt to call with any further concerns.

## 2020-02-05 NOTE — Discharge Instructions (Signed)
Please abstain from alcohol and smoking.  Remember our discussion about daily weights and low salt diet. Keep fluid intake to less than 1.5 L a day.  Please read all of the following.  You were cared for by a hospitalist during your hospital stay. If you have any questions about your discharge medications or the care you received while you were in the hospital after you are discharged, you can call the unit and asked to speak with the hospitalist on call if the hospitalist that took care of you is not available. Once you are discharged, your primary care physician will handle any further medical issues.   Please note that NO REFILLS for any discharge medications will be authorized once you are discharged, as it is imperative that you return to your primary care physician (or establish a relationship with a primary care physician if you do not have one) for your aftercare needs so that they can reassess your need for medications and monitor your lab values.  Please take all your medications with you for your next visit with your Primary MD. Please ask your Primary MD to get all Hospital records sent to his/her office. Please request your Primary MD to go over all hospital test results at the follow up.   If you experience worsening of your admission symptoms, develop shortness of breath, chest pain, suicidal or homicidal thoughts or a life threatening emergency, you must seek medical attention immediately by calling 911 or calling your MD.   Dennis Bast must read the complete instructions/literature along with all the possible adverse reactions/side effects for all the medicines you take including new medications that have been prescribed to you. Take new medicines after you have completely understood and accpet all the possible adverse reactions/side effects.    Do not drive when taking pain medications or sedatives.     Do not take more than prescribed Pain, Sleep and Anxiety Medications   If you have  smoked or chewed Tobacco in the last 2 yrs please stop. Stop any regular alcohol  and or recreational drug use.   Wear Seat belts while driving.  Low-Sodium Eating Plan Sodium, which is an element that makes up salt, helps you maintain a healthy balance of fluids in your body. Too much sodium can increase your blood pressure and cause fluid and waste to be held in your body. Your health care provider or dietitian may recommend following this plan if you have high blood pressure (hypertension), kidney disease, liver disease, or heart failure. Eating less sodium can help lower your blood pressure, reduce swelling, and protect your heart, liver, and kidneys. What are tips for following this plan? General guidelines  Most people on this plan should limit their sodium intake to 1,500-2,000 mg (milligrams) of sodium each day. Reading food labels   The Nutrition Facts label lists the amount of sodium in one serving of the food. If you eat more than one serving, you must multiply the listed amount of sodium by the number of servings.  Choose foods with less than 140 mg of sodium per serving.  Avoid foods with 300 mg of sodium or more per serving. Shopping  Look for lower-sodium products, often labeled as "low-sodium" or "no salt added."  Always check the sodium content even if foods are labeled as "unsalted" or "no salt added".  Buy fresh foods. ? Avoid canned foods and premade or frozen meals. ? Avoid canned, cured, or processed meats  Buy breads that have less than  80 mg of sodium per slice. Cooking  Eat more home-cooked food and less restaurant, buffet, and fast food.  Avoid adding salt when cooking. Use salt-free seasonings or herbs instead of table salt or sea salt. Check with your health care provider or pharmacist before using salt substitutes.  Cook with plant-based oils, such as canola, sunflower, or olive oil. Meal planning  When eating at a restaurant, ask that your food be  prepared with less salt or no salt, if possible.  Avoid foods that contain MSG (monosodium glutamate). MSG is sometimes added to [place] food, bouillon, and some canned foods. What foods are recommended? The items listed may not be a complete list. Talk with your dietitian about what dietary choices are best for you. Grains Low-sodium cereals, including oats, puffed wheat and rice, and shredded wheat. Low-sodium crackers. Unsalted rice. Unsalted pasta. Low-sodium bread. Whole-grain breads and whole-grain pasta. Vegetables Fresh or frozen vegetables. "No salt added" canned vegetables. "No salt added" tomato sauce and paste. Low-sodium or reduced-sodium tomato and vegetable juice. Fruits Fresh, frozen, or canned fruit. Fruit juice. Meats and other protein foods Fresh or frozen (no salt added) meat, poultry, seafood, and fish. Low-sodium canned tuna and salmon. Unsalted nuts. Dried peas, beans, and lentils without added salt. Unsalted canned beans. Eggs. Unsalted nut butters. Dairy Milk. Soy milk. Cheese that is naturally low in sodium, such as ricotta cheese, fresh mozzarella, or Swiss cheese Low-sodium or reduced-sodium cheese. Cream cheese. Yogurt. Fats and oils Unsalted butter. Unsalted margarine with no trans fat. Vegetable oils such as canola or olive oils. Seasonings and other foods Fresh and dried herbs and spices. Salt-free seasonings. Low-sodium mustard and ketchup. Sodium-free salad dressing. Sodium-free light mayonnaise. Fresh or refrigerated horseradish. Lemon juice. Vinegar. Homemade, reduced-sodium, or low-sodium soups. Unsalted popcorn and pretzels. Low-salt or salt-free chips. What foods are not recommended? The items listed may not be a complete list. Talk with your dietitian about what dietary choices are best for you. Grains Instant hot cereals. Bread stuffing, pancake, and biscuit mixes. Croutons. Seasoned rice or pasta mixes. Noodle soup cups. Boxed or frozen macaroni and  cheese. Regular salted crackers. Self-rising flour. Vegetables Sauerkraut, pickled vegetables, and relishes. Olives. [place] fries. Onion rings. Regular canned vegetables (not low-sodium or reduced-sodium). Regular canned tomato sauce and paste (not low-sodium or reduced-sodium). Regular tomato and vegetable juice (not low-sodium or reduced-sodium). Frozen vegetables in sauces. Meats and other protein foods Meat or fish that is salted, canned, smoked, spiced, or pickled. Bacon, ham, sausage, hotdogs, corned beef, chipped beef, packaged lunch meats, salt pork, jerky, pickled herring, anchovies, regular canned tuna, sardines, salted nuts. Dairy Processed cheese and cheese spreads. Cheese curds. Blue cheese. Feta cheese. String cheese. Regular cottage cheese. Buttermilk. Canned milk. Fats and oils Salted butter. Regular margarine. Ghee. Bacon fat. Seasonings and other foods Onion salt, garlic salt, seasoned salt, table salt, and sea salt. Canned and packaged gravies. Worcestershire sauce. Tartar sauce. Barbecue sauce. Teriyaki sauce. Soy sauce, including reduced-sodium. Steak sauce. Fish sauce. Oyster sauce. Cocktail sauce. Horseradish that you find on the shelf. Regular ketchup and mustard. Meat flavorings and tenderizers. Bouillon cubes. Hot sauce and Tabasco sauce. Premade or packaged marinades. Premade or packaged taco seasonings. Relishes. Regular salad dressings. Salsa. Potato and tortilla chips. Corn chips and puffs. Salted popcorn and pretzels. Canned or dried soups. Pizza. Frozen entrees and pot pies. Summary  Eating less sodium can help lower your blood pressure, reduce swelling, and protect your heart, liver, and kidneys.  Most people on  this plan should limit their sodium intake to 1,500-2,000 mg (milligrams) of sodium each day.  Canned, boxed, and frozen foods are high in sodium. Restaurant foods, fast foods, and pizza are also very high in sodium. You also get sodium by adding salt to  food.  Try to cook at home, eat more fresh fruits and vegetables, and eat less fast food, canned, processed, or prepared foods. This information is not intended to replace advice given to you by your health care provider. Make sure you discuss any questions you have with your health care provider. Document Revised: 03/04/2017 Document Reviewed: 03/15/2016 Elsevier Patient Education  [place].   Heart Failure, Diagnosis  Heart failure means that your heart is not able to pump blood in the right way. This makes it hard for your body to work well. Heart failure is usually a long-term (chronic) condition. You must take good care of yourself and follow your treatment plan from your doctor. What are the causes? This condition may be caused by:  High blood pressure.  Build up of cholesterol and fat in the arteries.  Heart attack. This injures the heart muscle.  Heart valves that do not open and close properly.  Damage of the heart muscle. This is also called cardiomyopathy.  Lung disease.  Abnormal heart rhythms. What increases the risk? The risk of heart failure goes up as a person ages. This condition is also more likely to develop in people who:  Are overweight.  Are male.  Smoke or chew tobacco.  Abuse alcohol or illegal drugs.  Have taken medicines that can damage the heart.  Have diabetes.  Have abnormal heart rhythms.  Have thyroid problems.  Have low blood counts (anemia). What are the signs or symptoms? Symptoms of this condition include:  Shortness of breath.  Coughing.  Swelling of the feet, ankles, legs, or belly.  Losing weight for no reason.  Trouble breathing.  Waking from sleep because of the need to sit up and get more air.  Rapid heartbeat.  Being very tired.  Feeling dizzy, or feeling like you may pass out (faint).  Having no desire to eat.  Feeling like you may vomit (nauseous).  Peeing (urinating) more at  night.  Feeling confused. How is this treated?     This condition may be treated with:  Medicines. These can be given to treat blood pressure and to make the heart muscles stronger.  Changes in your daily life. These may include eating a healthy diet, staying at a healthy body weight, quitting tobacco and illegal drug use, or doing exercises.  Surgery. Surgery can be done to open blocked valves, or to put devices in the heart, such as pacemakers.  A donor heart (heart transplant). You will receive a healthy heart from a donor. Follow these instructions at home:  Treat other conditions as told by your doctor. These may include high blood pressure, diabetes, thyroid disease, or abnormal heart rhythms.  Learn as much as you can about heart failure.  Get support as you need it.  Keep all follow-up visits as told by your doctor. This is important. Summary  Heart failure means that your heart is not able to pump blood in the right way.  This condition is caused by high blood pressure, heart attack, or damage of the heart muscle.  Symptoms of this condition include shortness of breath and swelling of the feet, ankles, legs, or belly. You may also feel very tired or feel like  you may vomit.  You may be treated with medicines, surgery, or changes in your daily life.  Treat other health conditions as told by your doctor. This information is not intended to replace advice given to you by your health care provider. Make sure you discuss any questions you have with your health care provider. Document Revised: 06/09/2018 Document Reviewed: 06/09/2018 Elsevier Patient Education  [place].

## 2020-02-05 NOTE — Progress Notes (Signed)
Patient alert and oriented x 4 today, excited about discharge home. He continues to have a good appetite, eating 75-100% of all meals, and drinking with and in between meals. Lungs are mostly clear to auscultation today with fine crackles in the bilateral bases upon terminal inspiration, and moves when he coughs. Patient stated, "that is always there". He denies dyspnea and has been maintaining sats of 93-96% on room air at rest. With exertion, he drops to around 90, sometimes 88-89%, but recovers quickly. Abdomen is soft and nontender, active bowel sounds in all quads, slightly distended, but patient thas been having medium BMs daily without difficulty; distention is significantly reduced from admission. He also has been urinating well, appropriate for intake and lasix administration. Patient has been ambulating with front wheeled walker in hallway with standby assist only; walking to bathroom occasionally with no walker, only standby assist. His gait is steady and it is evident that standing from a sitting position is not difficult for him anymore. Education on home/self management of heart failure given, continue to offer alternatives to drinking when bored. Patient is very receptive at this point in his stay, but it does seem he is underestimating the strength of his triggers when home alone and "unsupervised". Discharge orders in, awaiting magnesium infusion (Mg 1.3 this am) to complete prior to discharge. Call light in reach, able and encouraged to make needs known.

## 2020-02-05 NOTE — Progress Notes (Signed)
Patient walked in hall again with therapy. Therapist stated he maintained sats between 90-93% on RA throughout the walk.  Visited with patient and discussed triggers for drinking when he is at home. He stated that he usually drinks out of boredom. Asked him what his plan was this time during those periods of boredom, as this trigger is not likely to go away. He stated he may get up and walk around the house, watch TV, sit outside. They were not solid alternatives, but he insisted that he was done with drinking and also smoking, because they generally go hand and hand. Will continue brainstorming diversional activities with patient throughout the day as alternatives to drinking. Patient in agreement.

## 2020-02-05 NOTE — Progress Notes (Signed)
Patient ambulated in the hallway on room air. Did quite well, maintaining oxygen sats of 94%. At about the 25yard mark he started to slowly desat to as low as 86% with no feelings or evidence of dyspnea. We rested and he recovered back to 93-94% within 15 seconds. We restarted again and he again duplicated this process at the 25yard mark, recovered quickly and remained at 94% on RA.

## 2020-02-05 NOTE — Progress Notes (Signed)
Occupational Therapy Treatment Patient Details Name: Fernando [name] MRN: [id number] DOB: Apr 09, 1955 Today's Date: 02/05/2020    History of present illness Pt is a 64 y.o. M with significant PMH of CHF with reduced EF, hypertension and chronic alcohol use. Presents after a fall. Found to have right shoulder dislocation and subsequently reduced. 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    OT comments  Pt. Was seen for skilled OT. Pt.  Is not using his sling and states his arm feels fine. Pt. Is using B UE for his  ADLs and states he is not having any pain. Pt. Is I with his transfers and mobility. Pt. Is supposed to dc home today.   Follow Up Recommendations  No OT follow up    Equipment Recommendations       Recommendations for Other Services      Precautions / Restrictions Precautions Precautions: Fall;Other (comment) Precaution Comments: watch 02/HR Required Braces or Orthoses:  (Pt. is refusing to wear a sling) Restrictions RUE Weight Bearing: Non weight bearing Other Position/Activity Restrictions: Pt. is refusing to wear sling and is using r ue.        Mobility Bed Mobility Overal bed mobility:  (up in chair on arrival)                Transfers Overall transfer level: Needs assistance Equipment used: Rolling walker (2 wheeled) Transfers: Sit to/from Stand Sit to Stand: Independent Stand pivot transfers: Independent       General transfer comment: Pt. refused to use rw and states he is fine.     Balance Overall balance assessment: Needs assistance Sitting-balance support: Feet supported;No upper extremity supported Sitting balance-Leahy Scale: Good     Standing balance support: No upper extremity supported;During functional activity Standing balance-Leahy Scale: Fair Standing balance comment: standing marching with no UE support, required close supervision                           ADL either performed or assessed with clinical  judgement   ADL       Grooming: Wash/dry hands;Wash/dry face;Standing;Independent           Upper Body Dressing : Independent   Lower Body Dressing: Independent       Toileting- Clothing Manipulation and Hygiene: Independent       Functional mobility during ADLs: Independent General ADL Comments: Pt. is using his r ue and states it does not hurt at all and he does not need sling.      Vision       Perception     Praxis      Cognition Arousal/Alertness: Awake/alert Behavior During Therapy: WFL for tasks assessed/performed Overall Cognitive Status: Impaired/Different from baseline Area of Impairment: Safety/judgement                         Safety/Judgement: Decreased awareness of safety              Exercises Exercises: General Lower Extremity General Exercises - Lower Extremity Hip Flexion/Marching: AROM;Both;10 reps;Standing   Shoulder Instructions       General Comments      Pertinent Vitals/ Pain       Pain Assessment: No/denies pain  Home Living  Prior Functioning/Environment              Frequency  Min 2X/week        Progress Toward Goals  OT Goals(current goals can now be found in the care plan section)  Progress towards OT goals: Goals met/education completed, patient discharged from OT  Acute Rehab OT Goals Patient Stated Goal: going home today OT Goal Formulation: With patient Time For Goal Achievement: 02/10/20 Potential to Achieve Goals: Good ADL Goals Pt Will Perform Grooming: with supervision;sitting Pt Will Perform Upper Body Dressing: with min guard assist;sitting Pt/caregiver will Perform Home Exercise Program: Increased strength;Right Upper extremity;With written HEP provided;With Supervision Additional ADL Goal #1: Pt will perform bed mobility with minA as precursor to EOB/OOB ADL. Additional ADL Goal #2: Pt will follow 1 step commands with  75% accuracy and no more than min cues. Additional ADL Goal #3: Pt will participate in OOB mobility assessment.  Plan Discharge plan remains appropriate    Co-evaluation                 AM-PAC OT "6 Clicks" Daily Activity     Outcome Measure   Help from another person eating meals?: None Help from another person taking care of personal grooming?: None Help from another person toileting, which includes using toliet, bedpan, or urinal?: None Help from another person bathing (including washing, rinsing, drying)?: None Help from another person to put on and taking off regular upper body clothing?: None Help from another person to put on and taking off regular lower body clothing?: None 6 Click Score: 24    End of Session    OT Visit Diagnosis: Other abnormalities of gait and mobility (R26.89);Other symptoms and signs involving cognitive function   Activity Tolerance Patient tolerated treatment well   Patient Left with call bell/phone within reach   Nurse Communication  (ok therapy)        Time: 1130-1150 OT Time Calculation (min): 20 min  Charges: OT General Charges $OT Visit: 1 Visit OT Treatments $Self Care/Home Management : 8-22 mins  Reece Packer OT/L    Leonor Darnell 02/05/2020, 12:46 PM

## 2020-02-05 NOTE — Progress Notes (Signed)
Physical Therapy Treatment Patient Details Name: Fernando [name] MRN: [id number] DOB: Aug 14, 1955 Today's Date: 02/05/2020    History of Present Illness Pt is a 64 y.o. M with significant PMH of CHF with reduced EF, hypertension and chronic alcohol use. Presents after a fall. Found to have right shoulder dislocation and subsequently reduced. 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     PT Comments    Patient progressing towards physical therapy goals. Patient ambulated 300' with RW and min guard, improved O2 sats during mobility this session. On arrival, spO2 at rest 95%, with ambulation pt maintained 92% for majority of walk and lowest drop to 91%. Patient with improved balance this session however continues to require min guard-supervision for safety. Patient continues to be limited by generalized weakness, impaired balance, decreased activity tolerance. Recommend HHPT following discharge to maximize functional independence and supervision for OOB mobility.    Follow Up Recommendations  Home health PT;Supervision for mobility/OOB     Equipment Recommendations  None recommended by PT    Recommendations for Other Services       Precautions / Restrictions Precautions Precautions: Fall;Other (comment) Precaution Comments: watch 02/HR    Mobility  Bed Mobility Overal bed mobility:  (up in chair on arrival)                Transfers Overall transfer level: Needs assistance Equipment used: Rolling Emrah Ariola (2 wheeled) Transfers: Sit to/from Stand Sit to Stand: Supervision         General transfer comment: requires supervision for safety  Ambulation/Gait Ambulation/Gait assistance: Min guard Gait Distance (Feet): 300 Feet Assistive device: Rolling Shagun Wordell (2 wheeled) Gait Pattern/deviations: Step-through pattern;Narrow base of support     General Gait Details: spO2 prior to ambulation 95% on RA, during ambulation lowest drop 91% however maintained 92% majority of  ambulation. Returned to room, spO2 maintained 92%   Stairs             Wheelchair Mobility    Modified Rankin (Stroke Patients Only)       Balance Overall balance assessment: Needs assistance Sitting-balance support: Feet supported;No upper extremity supported Sitting balance-Leahy Scale: Good     Standing balance support: No upper extremity supported;During functional activity Standing balance-Leahy Scale: Fair Standing balance comment: standing marching with no UE support, required close supervision                            Cognition Arousal/Alertness: Awake/alert Behavior During Therapy: WFL for tasks assessed/performed Overall Cognitive Status: Impaired/Different from baseline Area of Impairment: Safety/judgement                         Safety/Judgement: Decreased awareness of safety            Exercises General Exercises - Lower Extremity Hip Flexion/Marching: AROM;Both;10 reps;Standing    General Comments        Pertinent Vitals/Pain Pain Assessment: No/denies pain    Home Living                      Prior Function            PT Goals (current goals can now be found in the care plan section) Acute Rehab PT Goals Patient Stated Goal: wanting to go home PT Goal Formulation: With patient Time For Goal Achievement: 02/09/20 Potential to Achieve Goals: Fair Progress towards PT goals: Progressing toward goals  Frequency    Min 3X/week      PT Plan Current plan remains appropriate    Co-evaluation              AM-PAC PT "6 Clicks" Mobility   Outcome Measure  Help needed turning from your back to your side while in a flat bed without using bedrails?: None Help needed moving from lying on your back to sitting on the side of a flat bed without using bedrails?: None Help needed moving to and from a bed to a chair (including a wheelchair)?: A Little Help needed standing up from a chair using your  arms (e.g., wheelchair or bedside chair)?: A Little Help needed to walk in hospital room?: A Little Help needed climbing 3-5 steps with a railing? : A Little 6 Click Score: 20    End of Session Equipment Utilized During Treatment: Gait belt Activity Tolerance: Patient tolerated treatment well Patient left: in chair;with call bell/phone within reach Nurse Communication: Mobility status PT Visit Diagnosis: Unsteadiness on feet (R26.81);Other abnormalities of gait and mobility (R26.89);History of falling (Z91.81);Difficulty in walking, not elsewhere classified (R26.2)     Time: 8871-9597 PT Time Calculation (min) (ACUTE ONLY): 17 min  Charges:  $Therapeutic Activity: 8-22 mins                     Perrin Maltese, PT, DPT Acute Rehabilitation Services Pager [phone] Office [phone]    Melene Plan Allred 02/05/2020, 9:38 AM

## 2020-02-05 NOTE — Progress Notes (Signed)
Removed sutures from right brow ridge (2) and nose (1) without difficulty. Patient tolerated well.

## 2020-02-06 ENCOUNTER — Encounter: Payer: Self-pay | Admitting: Internal Medicine

## 2020-02-18 ENCOUNTER — Ambulatory Visit (INDEPENDENT_AMBULATORY_CARE_PROVIDER_SITE_OTHER): Payer: Medicare Other | Admitting: Cardiology

## 2020-02-18 ENCOUNTER — Encounter: Payer: Self-pay | Admitting: Cardiology

## 2020-02-18 VITALS — BP 100/70 | HR 102 | Ht 67.0 in | Wt 160.6 lb

## 2020-02-18 DIAGNOSIS — I429 Cardiomyopathy, unspecified: Secondary | ICD-10-CM | POA: Diagnosis not present

## 2020-02-18 DIAGNOSIS — D539 Nutritional anemia, unspecified: Secondary | ICD-10-CM

## 2020-02-18 DIAGNOSIS — I5042 Chronic combined systolic (congestive) and diastolic (congestive) heart failure: Secondary | ICD-10-CM | POA: Diagnosis not present

## 2020-02-18 DIAGNOSIS — Z7189 Other specified counseling: Secondary | ICD-10-CM

## 2020-02-18 DIAGNOSIS — Z720 Tobacco use: Secondary | ICD-10-CM

## 2020-02-18 DIAGNOSIS — R000 Tachycardia, unspecified: Secondary | ICD-10-CM

## 2020-02-18 NOTE — Progress Notes (Signed)
Cardiology Office Note:    Date:  02/18/2020   ID:  Fernando [name], DOB 1955/12/26, MRN [id number]  PCP:  [name], MD  Cardiologist:  Buford Dresser, MD  Referring MD: [name], MD   CC: follow up  History of Present Illness:    Fernando [name] is a 64 y.o. male with a hx of cardiomyopathy diagnosed 10/2019 who is seen for follow up.  Cardiac history: admitted 10/2019, found to have cardiomyopathy and concern for LV thrombus. We discussed further evaluation of cardiomyopathy and thrombus at length, declined while admitted and at follow up.   Today: Reviewed recent complicated hospital admission. Discharged 02/05/20. Multiple issues addressed. He tells me that he had been drinking heavily and had a fall. However, on presentation his systolic blood pressures were in the 50s and his lactate was 4.6. Also noted dark stools, had worsened anemia. EGD showed esophagitis, portal hypertensive gastropathy. Also noted to have interstitial lung disease on imaging with acute pneumonia as well. He received IV lasix for acute on chronic systolic heart failure, echo unchanged.   His main concern today is that he can't walk more than 1/10th of a mile before he has to stop. Has noted that his oxygen numbers drop to 80% when he is walking, after sitting it returns to mid 90s. Has pending appt with pulmonology in 2 weeks. Occasional cough, rare sputum. No fevers or chills. No PND or orthopnea. No LE edema. Weights have been completely stable, 162-163 lbs.   Reviewed his recent hospital course with him. Was drinking 1/5th of liquor per day, now drinking none since 01/25/20. Still smoking 1/2 ppd.  Reviewed results of his echo. Discussed that he continues to have low pump function. His family member noted that while he was drinking he was not taking his medications. Reviewed concern for possibly thrombus, but with recent anemia/findings on EGD, he is elevated risk for anticoagulation. He  understands about the thrombus and wants to leave it alone. We discussed options for evaluation of ischemia, see below. He would like to do myoview first and then cath if abnormal. Consented today.  Denies chest pain, shortness of breath at rest or with normal exertion. No PND, orthopnea, LE edema or unexpected weight gain. No syncope or palpitations.  Past Medical History:  Diagnosis Date  . Alcohol dependence ([place])   . Arthritis   . Cataract   . Chronic systolic (congestive) heart failure (HCC)    EF 25-30% in 10/2019  . Dyslipidemia   . Essential hypertension   . History of kidney stones   . Hyperlipidemia   . Hypertension   . Pneumonia   . Prosthetic shoulder infection, initial encounter [facility])     Past Surgical History:  Procedure Laterality Date  . COLONOSCOPY    . COLONOSCOPY     multiple  . ESOPHAGOGASTRODUODENOSCOPY (EGD) WITH PROPOFOL N/A 01/31/2020   Procedure: ESOPHAGOGASTRODUODENOSCOPY (EGD) WITH PROPOFOL;  Surgeon: Doran Stabler, MD;  Location: [facility];  Service: Gastroenterology;  Laterality: N/A;  . HERNIA REPAIR  1990  . KNEE SURGERY  1975   left men. repair  . KNEE SURGERY  1975  . REVERSE SHOULDER ARTHROPLASTY Right 06/21/2018   Procedure: REVERSE SHOULDER ARTHROPLASTY;  Surgeon: Hiram Gash, MD;  Location: [facility];  Service: Orthopedics;  Laterality: Right;  . ROTATOR CUFF REPAIR     right  . ROTATOR CUFF REPAIR    . TONSILLECTOMY    . TOTAL SHOULDER REPLACEMENT  2020  .  UMBILICAL HERNIA REPAIR      Current Medications: Current Outpatient Medications on File Prior to Visit  Medication Sig  . allopurinol (ZYLOPRIM) 100 MG tablet Take 200 mg by mouth daily.   . carvedilol (COREG) 3.125 MG tablet Take 3.125 mg by mouth 2 (two) times daily.  . famotidine (PEPCID) 40 MG tablet Take 1 tablet (40 mg total) by mouth daily.  . folic acid (FOLVITE) 1 MG tablet Take 1 mg by mouth daily.  . furosemide (LASIX) 40 MG tablet Take 1 tablet (40 mg total) by  mouth daily.  Marland Kitchen losartan (COZAAR) 25 MG tablet Take 0.5 tablets (12.5 mg total) by mouth daily.  . Multiple Vitamin (MULTIVITAMIN) tablet Take 1 tablet by mouth daily.   . potassium chloride (KLOR-CON) 10 MEQ tablet Take 2 tablets (20 mEq total) by mouth daily.  Marland Kitchen albuterol (PROVENTIL) (2.5 MG/3ML) 0.083% nebulizer solution Take 3 mLs (2.5 mg total) by nebulization every 6 (six) hours as needed for wheezing. (Patient not taking: Reported on 02/18/2020)   No current facility-administered medications on file prior to visit.     Allergies:   Patient has no known allergies.   Social History   Tobacco Use  . Smoking status: Current Every Day Smoker    Packs/day: 0.50    Years: 50.00    Pack years: 25.00  . Smokeless tobacco: Never Used  Vaping Use  . Vaping Use: Never used  Substance Use Topics  . Alcohol use: Yes    Alcohol/week: 14.0 standard drinks    Types: 14 Shots of liquor per week  . Drug use: Yes    Types: Marijuana    Family History: family history includes Heart failure in his mother; Lung cancer in his father. There is no history of Colon cancer, Esophageal cancer, Stomach cancer, Liver cancer, Pancreatic cancer, or Rectal cancer.  ROS:   Please see the history of present illness.  Additional pertinent ROS otherwise unremarkable.  EKGs/Labs/Other Studies Reviewed:    The following studies were reviewed today: Echo 01/29/20 1. Cannot exclude apicoseptal mural thrombus. Left ventricular ejection  fraction, by estimation, is 25 to 30%. The left ventricle has severely  decreased function. The left ventricle demonstrates global hypokinesis.  Left ventricular diastolic parameters  are consistent with Grade I diastolic dysfunction (impaired relaxation).  2. Right ventricular systolic function is mildly reduced. The right  ventricular size is moderately enlarged. Tricuspid regurgitation signal is  inadequate for assessing PA pressure.  3. The mitral valve is normal in  structure. No evidence of mitral valve  regurgitation.  4. The aortic valve is normal in structure. Aortic valve regurgitation is  not visualized. No aortic stenosis is present.  5. Aortic dilatation noted. There is mild dilatation of the aortic root,  measuring 39 mm.   Full echo 10/27/19 1. Suspect calcified apical false tendon vs organized thrombus at the  apex. Left ventricular ejection fraction, by estimation, is 25 to 30%. The  left ventricle has severely decreased function. The left ventricle  demonstrates global hypokinesis. There is  mild left ventricular hypertrophy. Left ventricular diastolic parameters  are consistent with Grade I diastolic dysfunction (impaired relaxation).  Elevated left ventricular end-diastolic pressure. There is incoordinate  septal motion.  2. Right ventricular systolic function is mildly reduced. The right  ventricular size is normal. Tricuspid regurgitation signal is inadequate  for assessing PA pressure.  3. The mitral valve is abnormal. Mild mitral valve regurgitation.  4. The aortic valve is tricuspid. Aortic valve regurgitation  is not  visualized.  5. The inferior vena cava is dilated in size with <50% respiratory  variability, suggesting right atrial pressure of 15 mmHg.   Conclusion(s)/Recommendation(s): Unable to exclude left ventricular  thrombus, although if present, appears organized/calcified. Would  recommend a repeat transthoracic echocardiogram with contrast. D/w  sonographer.   Limited echo 10/27/19 1. There is an apical filling defect that is not clearly linear and may  represent thrombus. Based on non-contrast images, this appears organized.  Consider additional imaging such as cMRI at some point to further  evaluate.. Left ventricular ejection  fraction, by estimation, is 25 to 30%. The left ventricle has severely  decreased function. The left ventricle demonstrates global hypokinesis.   EKG:  EKG is personally  reviewed.  The ekg ordered today demonstrates sinus rhythm at 94 bpm with RBBB  Recent Labs: 10/25/2019: B Natriuretic Peptide 349.4 01/26/2020: ALT 75; TSH 4.012 02/01/2020: Platelets 169 02/02/2020: Hemoglobin 9.7 02/05/2020: BUN 13; Creatinine, Ser 1.06; Magnesium 1.6; Potassium 3.6; Sodium 137  Recent Lipid Panel    Component Value Date/Time   CHOL  07/16/2008 1914    92 (NOTE) ATP III Classification:      < 200        mg/dL        Desirable     200 - 239     mg/dL        Borderline High     >= 240        mg/dL        High    TRIG 258 (H) 07/26/2008 0315    Physical Exam:    VS:  BP 100/70 (BP Location: Left Arm, Patient Position: Sitting)   Pulse (!) 102   Ht 5\' 7"  (1.702 m)   Wt 160 lb 9.6 oz (72.8 kg)   SpO2 97%   BMI 25.15 kg/m     Wt Readings from Last 3 Encounters:  02/18/20 160 lb 9.6 oz (72.8 kg)  02/05/20 161 lb 3.2 oz (73.1 kg)  12/14/19 170 lb 3.2 oz (77.2 kg)    GEN: Well nourished, well developed in no acute distress HEENT: Normal, moist mucous membranes NECK: No JVD CARDIAC: regular rhythm, normal S1 and S2, no rubs or gallops. No murmur. VASCULAR: Radial and DP pulses 2+ bilaterally. No carotid bruits RESPIRATORY:  Clear to auscultation without rales, wheezing or rhonchi  ABDOMEN: Soft, non-tender, non-distended MUSCULOSKELETAL:  Ambulates independently SKIN: Warm and dry, no edema NEUROLOGIC:  Alert and oriented x 3. No focal neuro deficits noted. PSYCHIATRIC:  Normal affect   ASSESSMENT:    1. Cardiomyopathy, unspecified type ([place])   2. Macrocytic anemia   3. Cardiac risk counseling   4. Counseling on health promotion and disease prevention   5. Chronic combined systolic and diastolic heart failure ([place])   6. Sinus tachycardia   7. Tobacco abuse    PLAN:    Cardiomyopathy, with chronic systolic and diastolic heart failure: high suspicion for alcohol induced, and he is now completely abstinent; did relapse after admission 10/2019 resulting in  admission 01/2020, but none since 01/25/20 -initially did not have cath due to thrombocytopenia.  -tolerating only very low dose carvedilol and losartan -we discussed options for ischemic evaluation at length. He would like to defer on cath if possible but is amenable to lexiscan myoview  The risks [chest pain, shortness of breath, cardiac arrhythmias, dizziness, blood pressure fluctuations, myocardial infarction, stroke/transient ischemic attack, nausea, vomiting, allergic reaction, radiation exposure, metallic taste sensation  and life-threatening complications (estimated to be 1 in 10,000)], benefits (risk stratification, diagnosing coronary artery disease, treatment guidance) and alternatives of a nuclear stress test were discussed in detail with Mr. Keadle and he agrees to proceed.  -if myoview abnormal, will need to discuss proceeding to cath -appears euvolemic today. His shortness of breath is out of proportion to his fluid status. He is tolerated lasix daily. I am concerned that the desaturations he notes with exertion may be due to lung disease. He has an upcoming appt to discuss this.  Possible LV thrombus:  -not able to exclude even with contrast. Most recent echo again cannot exclude -we have discussed cMRI to evaluate. He has elevated risk of bleeding with macrocytic anemia, portal gastropathy, recent anemia requiring transfusion -after shared decision making, he does not with to pursue additional imaging and does not wish to trial anticoagulation -counseled on stroke symptoms, other symptoms that need immediate medical attention  Sinus tachycardia: chronic, suspect secondary to multiple underlying comorbidities -already on low dose beta blocker, would not push harder to treat tachycardia as this is secondary and he is borderline hypotensive  Tobacco abuse: smoking 1/2 ppd, not interested in quitting  Alcohol abuse: with recent hospitalization. Now abstinent since 01/25/20  Plan for  follow up: 8 weeks  Total time of encounter: 44 minutes total time of encounter, including 23 minutes spent in face-to-face patient care. This time includes coordination of care and counseling regarding recent hospitalization, cardiomyopathy, and possible LV thrombus. Remainder of non-face-to-face time involved reviewing chart documents/testing relevant to the patient encounter and documentation in the medical record.  Buford Dresser, MD, PhD [place]  [facility]   Medication Adjustments/Labs and Tests Ordered: Current medicines are reviewed at length with the patient today.  Concerns regarding medicines are outlined above.  Orders Placed This Encounter  Procedures  . MYOCARDIAL PERFUSION IMAGING  . EKG 12-Lead   No orders of the defined types were placed in this encounter.   Patient Instructions  Medication Instructions:  Your Physician recommend you continue on your current medication as directed.    *If you need a refill on your cardiac medications before your next appointment, please call your pharmacy*   Lab Work: None  Testing/Procedures: Your physician has requested that you have a lexiscan myoview. For further information please visit [email or web address]. Please follow instruction sheet, as given. [address]. [address]   Follow-Up: At [facility] ([place]), you and your health needs are our priority.  As part of our continuing mission to provide you with exceptional heart care, we have created designated Provider Care Teams.  These Care Teams include your primary Cardiologist (physician) and Advanced Practice Providers (APPs -  Physician Assistants and Nurse Practitioners) who all work together to provide you with the care you need, when you need it.  We recommend signing up for the patient portal called "MyChart".  Sign up information is provided on this After Visit Summary.  MyChart is used to connect with patients for Virtual Visits (Telemedicine).   Patients are able to view lab/test results, encounter notes, upcoming appointments, etc.  Non-urgent messages can be sent to your provider as well.   To learn more about what you can do with MyChart, go to [email or web address].    Your next appointment:   8 week(s)  The format for your next appointment:   In Person  Provider:   Buford Dresser, MD  You are scheduled for a Myocardial Perfusion Imaging Study Please arrive 15 minutes prior  to your appointment time for registration and insurance purposes.  The test will take approximately 3 to 4 hours to complete; you may bring reading material.  If someone comes with you to your appointment, they will need to remain in the main lobby due to limited space in the testing area. **If you are pregnant or breastfeeding, please notify the nuclear lab prior to your appointment**  How to prepare for your Myocardial Perfusion Test: . Do not eat or drink 3 hours prior to your test, except you may have water. . Do not consume products containing caffeine (regular or decaffeinated) 12 hours prior to your test. (ex: coffee, chocolate, sodas, tea). . Do bring a list of your current medications with you.  If not listed below, you may take your medications as normal. . Do wear comfortable clothes (no dresses or overalls) and walking shoes, tennis shoes preferred (No heels or open toe shoes are allowed). . Do NOT wear cologne, perfume, aftershave, or lotions (deodorant is allowed). . If these instructions are not followed, your test will have to be rescheduled.  Please report to [place], [address] for your test.  If you have questions or concerns about your appointment, you can call the Nuclear Lab at [phone].  If you cannot keep your appointment, please provide 24 hours notification to the Nuclear Lab, to avoid a possible $50 charge to your account.     Signed, Buford Dresser, MD PhD 02/18/2020    [place]

## 2020-02-18 NOTE — Patient Instructions (Signed)
Medication Instructions:  Your Physician recommend you continue on your current medication as directed.    *If you need a refill on your cardiac medications before your next appointment, please call your pharmacy*   Lab Work: None  Testing/Procedures: Your physician has requested that you have a lexiscan myoview. For further information please visit [email or web address]. Please follow instruction sheet, as given. [place]. [address]   Follow-Up: At [facility], you and your health needs are our priority.  As part of our continuing mission to provide you with exceptional heart care, we have created designated Provider Care Teams.  These Care Teams include your primary Cardiologist (physician) and Advanced Practice Providers (APPs -  Physician Assistants and Nurse Practitioners) who all work together to provide you with the care you need, when you need it.  We recommend signing up for the patient portal called "MyChart".  Sign up information is provided on this After Visit Summary.  MyChart is used to connect with patients for Virtual Visits (Telemedicine).  Patients are able to view lab/test results, encounter notes, upcoming appointments, etc.  Non-urgent messages can be sent to your provider as well.   To learn more about what you can do with MyChart, go to [email or web address].    Your next appointment:   8 week(s)  The format for your next appointment:   In Person  Provider:   Buford Dresser, MD  You are scheduled for a Myocardial Perfusion Imaging Study Please arrive 15 minutes prior to your appointment time for registration and insurance purposes.  The test will take approximately 3 to 4 hours to complete; you may bring reading material.  If someone comes with you to your appointment, they will need to remain in the main lobby due to limited space in the testing area. **If you are pregnant or breastfeeding, please notify the nuclear lab prior to your  appointment**  How to prepare for your Myocardial Perfusion Test: . Do not eat or drink 3 hours prior to your test, except you may have water. . Do not consume products containing caffeine (regular or decaffeinated) 12 hours prior to your test. (ex: coffee, chocolate, sodas, tea). . Do bring a list of your current medications with you.  If not listed below, you may take your medications as normal. . Do wear comfortable clothes (no dresses or overalls) and walking shoes, tennis shoes preferred (No heels or open toe shoes are allowed). . Do NOT wear cologne, perfume, aftershave, or lotions (deodorant is allowed). . If these instructions are not followed, your test will have to be rescheduled.  Please report to [place], [address] for your test.  If you have questions or concerns about your appointment, you can call the Nuclear Lab at [phone].  If you cannot keep your appointment, please provide 24 hours notification to the Nuclear Lab, to avoid a possible $50 charge to your account.

## 2020-02-22 ENCOUNTER — Telehealth (HOSPITAL_COMMUNITY): Payer: Self-pay | Admitting: *Deleted

## 2020-02-22 NOTE — Telephone Encounter (Signed)
Close encounter 

## 2020-02-27 ENCOUNTER — Ambulatory Visit (HOSPITAL_COMMUNITY)
Admission: RE | Admit: 2020-02-27 | Discharge: 2020-02-27 | Disposition: A | Discharge status: Home / Self Care | Payer: Medicare Other | Source: Ambulatory Visit | Attending: Cardiovascular Disease | Admitting: Cardiovascular Disease

## 2020-02-27 ENCOUNTER — Other Ambulatory Visit: Payer: Self-pay

## 2020-02-27 DIAGNOSIS — I429 Cardiomyopathy, unspecified: Secondary | ICD-10-CM | POA: Diagnosis not present

## 2020-02-27 LAB — MYOCARDIAL PERFUSION IMAGING
LV dias vol: 107 mL (ref 62–150)
LV sys vol: 54 mL
Peak HR: 117 {beats}/min
Rest HR: 95 {beats}/min
SDS: 0
SRS: 2
SSS: 2
TID: 1

## 2020-02-27 MED ORDER — TECHNETIUM TC 99M TETROFOSMIN IV KIT
10.1000 | PACK | Freq: Once | INTRAVENOUS | Status: AC | PRN
  Administered 2020-02-27: 10.1 via INTRAVENOUS
  Filled 2020-02-27: qty 11

## 2020-02-27 MED ORDER — REGADENOSON 0.4 MG/5ML IV SOLN
0.4000 mg | Freq: Once | INTRAVENOUS | Status: AC
  Administered 2020-02-27: 0.4 mg via INTRAVENOUS

## 2020-02-27 MED ORDER — TECHNETIUM TC 99M TETROFOSMIN IV KIT
28.9000 | PACK | Freq: Once | INTRAVENOUS | Status: AC | PRN
  Administered 2020-02-27: 28.9 via INTRAVENOUS
  Filled 2020-02-27: qty 29

## 2020-03-07 ENCOUNTER — Other Ambulatory Visit (INDEPENDENT_AMBULATORY_CARE_PROVIDER_SITE_OTHER): Payer: Medicare Other

## 2020-03-07 ENCOUNTER — Ambulatory Visit (INDEPENDENT_AMBULATORY_CARE_PROVIDER_SITE_OTHER): Payer: Medicare Other | Admitting: Internal Medicine

## 2020-03-07 ENCOUNTER — Encounter: Payer: Self-pay | Admitting: Internal Medicine

## 2020-03-07 VITALS — BP 100/60 | HR 100 | Ht 67.0 in | Wt 162.0 lb

## 2020-03-07 DIAGNOSIS — K7010 Alcoholic hepatitis without ascites: Secondary | ICD-10-CM

## 2020-03-07 DIAGNOSIS — R935 Abnormal findings on diagnostic imaging of other abdominal regions, including retroperitoneum: Secondary | ICD-10-CM

## 2020-03-07 DIAGNOSIS — D539 Nutritional anemia, unspecified: Secondary | ICD-10-CM | POA: Diagnosis not present

## 2020-03-07 DIAGNOSIS — F1020 Alcohol dependence, uncomplicated: Secondary | ICD-10-CM

## 2020-03-07 DIAGNOSIS — K8689 Other specified diseases of pancreas: Secondary | ICD-10-CM | POA: Diagnosis not present

## 2020-03-07 LAB — CBC WITH DIFFERENTIAL/PLATELET
Basophils Absolute: 0 10*3/uL (ref 0.0–0.1)
Basophils Relative: 0.5 % (ref 0.0–3.0)
Eosinophils Absolute: 0.1 10*3/uL (ref 0.0–0.7)
Eosinophils Relative: 1.1 % (ref 0.0–5.0)
HCT: 37.5 % — ABNORMAL LOW (ref 39.0–52.0)
Hemoglobin: 12.7 g/dL — ABNORMAL LOW (ref 13.0–17.0)
Lymphocytes Relative: 22 % (ref 12.0–46.0)
Lymphs Abs: 1.9 10*3/uL (ref 0.7–4.0)
MCHC: 34 g/dL (ref 30.0–36.0)
MCV: 101 fl — ABNORMAL HIGH (ref 78.0–100.0)
Monocytes Absolute: 0.9 10*3/uL (ref 0.1–1.0)
Monocytes Relative: 10.3 % (ref 3.0–12.0)
Neutro Abs: 5.8 10*3/uL (ref 1.4–7.7)
Neutrophils Relative %: 66.1 % (ref 43.0–77.0)
Platelets: 248 10*3/uL (ref 150.0–400.0)
RBC: 3.71 Mil/uL — ABNORMAL LOW (ref 4.22–5.81)
RDW: 16.1 % — ABNORMAL HIGH (ref 11.5–15.5)
WBC: 8.8 10*3/uL (ref 4.0–10.5)

## 2020-03-07 LAB — COMPREHENSIVE METABOLIC PANEL
ALT: 7 U/L (ref 0–53)
AST: 11 U/L (ref 0–37)
Albumin: 3.9 g/dL (ref 3.5–5.2)
Alkaline Phosphatase: 103 U/L (ref 39–117)
BUN: 15 mg/dL (ref 6–23)
CO2: 28 mEq/L (ref 19–32)
Calcium: 9.6 mg/dL (ref 8.4–10.5)
Chloride: 97 mEq/L (ref 96–112)
Creatinine, Ser: 0.82 mg/dL (ref 0.40–1.50)
GFR: 91.42 mL/min (ref >60.00)
Glucose, Bld: 96 mg/dL (ref 70–99)
Potassium: 3.6 mEq/L (ref 3.5–5.1)
Sodium: 134 mEq/L — ABNORMAL LOW (ref 135–145)
Total Bilirubin: 0.4 mg/dL (ref 0.2–1.2)
Total Protein: 7.6 g/dL (ref 6.0–8.3)

## 2020-03-07 LAB — LIPASE: Lipase: 26 U/L (ref 11.0–59.0)

## 2020-03-07 NOTE — Progress Notes (Addendum)
HISTORY OF PRESENT ILLNESS:  Fernando [name] is a 64 y.o. male with chronic alcoholism and multiple complications thereof who is sent today regarding pancreatic abnormality on imaging.  I have seen the patient in the past for history of adenomatous colon polyps.  He has undergone colonoscopy in 2007, 2012, and most recently May 2019.  2 diminutive polyps on his last examination for which follow-up in 5 years was recommended.  Patient had been drinking large quantities of vodka on a daily basis.  Approximately 1/5 of vodka per day.  He was admitted to the hospital January 26, 2020 with symptomatic congestive heart failure, hypertension, alcoholic hepatitis, and anemia.  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  He tells me that he has had no alcohol since October 22.  During his hospitalization his AST was 149, ALT 75, alkaline phosphatase 104, total bilirubin 1.8.  His admission alcohol level was 224.  Hemoglobin 9.7 with MCV 106.  B12, ferritin, folate were normal.  Due to anemia and complaints of dark stools he did undergo upper endoscopy January 31, 2020.  Examination was normal except for mild portal hypertensive gastropathy.  No acute lesions.  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  There was some regional fat stranding.  Patient does not have a documented history or reported history of pancreatitis.  He did subsequently undergo MRI imaging (reporting being requested).  Noninvasive stress test February 27, 2020 revealed ejection fraction of approximately 50%.  He has not been vaccinated against Covid  REVIEW OF SYSTEMS:  All non-GI ROS negative unless otherwise stated in HPI except for arthritis  Past Medical History:  Diagnosis Date  . Alcohol dependence (Fernando [name])   . Arthritis   . Cataract   . Chronic systolic (congestive) heart failure (HCC)    EF 25-30% in 10/2019  .  Dyslipidemia   . Essential hypertension   . History of kidney stones   . Hyperlipidemia   . Hypertension   . Pneumonia   . Prosthetic shoulder infection, initial encounter [facility])     Past Surgical History:  Procedure Laterality Date  . COLONOSCOPY    . COLONOSCOPY     multiple  . ESOPHAGOGASTRODUODENOSCOPY (EGD) WITH PROPOFOL N/A 01/31/2020   Procedure: ESOPHAGOGASTRODUODENOSCOPY (EGD) WITH PROPOFOL;  Surgeon: Fernando Stabler, MD;  Location: [facility];  Service: Gastroenterology;  Laterality: N/A;  . HERNIA REPAIR  1990  . KNEE SURGERY  1975   left men. repair  . KNEE SURGERY  1975  . REVERSE SHOULDER ARTHROPLASTY Right 06/21/2018   Procedure: REVERSE SHOULDER ARTHROPLASTY;  Surgeon: Hiram Gash, MD;  Location: [facility];  Service: Orthopedics;  Laterality: Right;  . ROTATOR CUFF REPAIR     right  . ROTATOR CUFF REPAIR    . TONSILLECTOMY    . TOTAL SHOULDER REPLACEMENT  2020  . UMBILICAL HERNIA REPAIR      Social History Fernando [name]  reports that he has been smoking. He has a 25.00 pack-year smoking history. He has never used smokeless tobacco. He reports current alcohol use of about 14.0 standard drinks of alcohol per week. He reports current drug use. Drug: Marijuana.  family history includes Heart failure in his mother; Lung cancer in his father.  No Known Allergies     PHYSICAL EXAMINATION: Vital signs: BP 100/60   Pulse 100   Ht 5\' 7"  (1.702  m)   Wt 162 lb (73.5 kg)   BMI 25.37 kg/m   Constitutional: generally well-appearing, no acute distress Psychiatric: alert and oriented x3, cooperative Eyes: extraocular movements intact, anicteric, conjunctiva pink Mouth: oral pharynx moist, no lesions Neck: supple no lymphadenopathy Cardiovascular: heart regular rate and rhythm, no murmur Lungs: clear to auscultation bilaterally except for dry crackles at the left base Abdomen: soft, nontender, nondistended, no obvious ascites, no peritoneal signs, normal bowel  sounds, no organomegaly Rectal: Omitted Extremities: no clubbing, cyanosis, or lower extremity edema bilaterally Skin: no lesions on visible extremities Neuro: No focal deficits.  Cranial nerves intact.  No asterixis  ASSESSMENT:  1.  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  Rule out neoplasm 2.  Alcoholic hepatitis (mild).  No ascites and normal INR during his hospitalization.  Has been off alcohol since October 22.  Needs follow-up blood work. 3.  Macrocytic anemia secondary to chronic alcoholism. 4.  Upper endoscopy revealing mild portal hypertensive gastropathy.  No varices.  No other mucosal lesions. 5.  History of adenomatous colon polyps.  Surveillance up-to-date.  Last examination May 2019. 6.  Not vaccinated against Covid  PLAN:  1.  CBC, lipase, comprehensive metabolic panel today 2.  Consult with endoscopic ultrasound colleagues regarding outpatient EUS with FNA.  Patient will be going to [place] in the next few weeks, and will be home after first week in January.  Arrangements can be made thereafter.  I discussed with him the nature of the procedure. 3.  Continue complete abstinence from alcohol 4.  Follow-up on recent MRI 5.  Encouraged to proceed with a Covid vaccination series.  I reviewed with him the pros and cons of vaccination A total time of 40 minutes was spent reviewing see the patient, reviewing his hospitalization, x-rays, laboratories, endoscopy report, cardiology evaluations.  Also, obtaining comprehensive history, performing comprehensive physical examination, counseling the patient on multiple above listed issues, ordering advanced procedures and laboratories.  Finally, documenting clinical information in the health record  ADDENDUM: I have received the MRI/MRCP of the abdomen report from [place] dated February 25, 2020.  Pancreas: At the pancreatic tail, there is a complex thick-walled cystic mass with enhancing internal septation  measuring 3.5 x 3.4 cm, previously 4.2 x 3.6 cm.  The pancreas is otherwise unremarkable.  No dilation of the main pancreatic duct. Liver: No focal liver lesion or biliary dilation Gallbladder: No stones or wall thickening Spleen: Unremarkable General glands: Unremarkable Kidneys: Bilateral renal cysts.  No hydronephrosis GI: Diffuse gastric wall thickening, appears chronic.  No bowel obstruction.  No ascites or intra-abdominal adenopathy.  Normal caliber vessels. Musculoskeletal.  Unremarkable bone marrow signal.  Degenerative changes of the spine

## 2020-03-07 NOTE — Patient Instructions (Signed)
Your provider has requested that you go to the basement level for lab work before leaving today. Press "B" on the elevator. The lab is located at the first door on the left as you exit the elevator.  

## 2020-03-10 ENCOUNTER — Other Ambulatory Visit: Payer: Self-pay

## 2020-03-10 DIAGNOSIS — K8689 Other specified diseases of pancreas: Secondary | ICD-10-CM

## 2020-03-11 ENCOUNTER — Telehealth: Payer: Self-pay | Admitting: Cardiology

## 2020-03-11 NOTE — Telephone Encounter (Signed)
Spoke with Fernando [name], the patient is needing to continue PT for strength. We are not the providers who gave the first order for PT so Fernando [name] will reach out to the ordering provider for continued orders.

## 2020-03-11 NOTE — Telephone Encounter (Signed)
Fernando [name] is calling requesting verbal approval to continue physical therapy with [name] once a week for 3 more weeks. Please advise.

## 2020-03-13 ENCOUNTER — Other Ambulatory Visit: Payer: Medicare Other

## 2020-03-13 DIAGNOSIS — K8689 Other specified diseases of pancreas: Secondary | ICD-10-CM

## 2020-03-14 LAB — CANCER ANTIGEN 19-9: CA 19-9: 8 U/mL (ref <34)

## 2020-03-17 ENCOUNTER — Telehealth: Payer: Self-pay

## 2020-03-17 DIAGNOSIS — K8689 Other specified diseases of pancreas: Secondary | ICD-10-CM

## 2020-03-17 NOTE — Telephone Encounter (Signed)
-----   Message from Milus Banister, MD sent at 03/10/2020  6:16 AM EST ----- Regarding: RE: EUS for pancreatic tail mass Agree, thanks  Oday Ridings, See below  ----- Message ----- From: Irving Copas., MD Sent: 03/08/2020  12:01 AM EST To: Milus Banister, MD, Irene Shipper, MD Subject: RE: EUS for pancreatic tail mass               JP, Read note. In setting of mixed lesion, agree EGD/EUS with attempt at FNB is reasonable. I would probably go ahead and get a Ca19-9 in the next couple of weeks. We can set up for January since you said that he was going to be away the next few weeks. Kadarrius Yanke, please set up for EUS Linear 60 minute procedure with myself or DJ as available for the patient when he returns from [place]. Thanks. GM ----- Message ----- From: Irene Shipper, MD Sent: 03/07/2020   4:36 PM EST To: Milus Banister, MD, # Subject: EUS for pancreatic tail mass                   EUS colleagues,   This patient was set up to see me in follow-up after a significant hospitalization for multiple alcohol-related problems (see my clinic note).  Clinically, he is markedly improved.  One of his many issues is a tail of the pancreas mass found incidentally inpatient imaging, which I believe may require EUS. Let me know your thoughts.    As always, many thanks. Jenny Reichmann

## 2020-03-18 ENCOUNTER — Other Ambulatory Visit: Payer: Self-pay

## 2020-03-18 DIAGNOSIS — K8689 Other specified diseases of pancreas: Secondary | ICD-10-CM

## 2020-03-18 NOTE — Telephone Encounter (Signed)
The pt has been scheduled for 04/21/20 at [facility] with Dr Rush Landmark.  COVID test on 1/13.  CA 19-9 lab entered.  Instructions printed and left message for the pt to return call to discuss the appt information.

## 2020-03-19 NOTE — Telephone Encounter (Signed)
Pt is requesting a call back from a nurse (missed call) 

## 2020-03-19 NOTE — Telephone Encounter (Signed)
The pt has been advised and instructed.  He will call if he has any questions after reviewing the information mailed.  He will come in for labs as soon as he can.

## 2020-03-19 NOTE — Telephone Encounter (Signed)
Left message on machine to call back  

## 2020-04-17 ENCOUNTER — Other Ambulatory Visit: Payer: Self-pay

## 2020-04-17 ENCOUNTER — Other Ambulatory Visit (HOSPITAL_COMMUNITY)
Admission: RE | Admit: 2020-04-17 | Discharge: 2020-04-17 | Disposition: A | Discharge status: Home / Self Care | Payer: Medicare Other | Source: Ambulatory Visit | Attending: Gastroenterology | Admitting: Gastroenterology

## 2020-04-17 ENCOUNTER — Ambulatory Visit (INDEPENDENT_AMBULATORY_CARE_PROVIDER_SITE_OTHER): Payer: Medicare Other | Admitting: Cardiology

## 2020-04-17 ENCOUNTER — Encounter: Payer: Self-pay | Admitting: Cardiology

## 2020-04-17 ENCOUNTER — Encounter (HOSPITAL_COMMUNITY): Payer: Self-pay | Admitting: Gastroenterology

## 2020-04-17 VITALS — BP 126/70 | HR 72 | Ht 67.0 in | Wt 169.6 lb

## 2020-04-17 DIAGNOSIS — I428 Other cardiomyopathies: Secondary | ICD-10-CM | POA: Diagnosis not present

## 2020-04-17 DIAGNOSIS — Z20822 Contact with and (suspected) exposure to covid-19: Secondary | ICD-10-CM | POA: Diagnosis not present

## 2020-04-17 DIAGNOSIS — Z720 Tobacco use: Secondary | ICD-10-CM | POA: Diagnosis not present

## 2020-04-17 DIAGNOSIS — Z01812 Encounter for preprocedural laboratory examination: Secondary | ICD-10-CM | POA: Diagnosis present

## 2020-04-17 DIAGNOSIS — Z7189 Other specified counseling: Secondary | ICD-10-CM

## 2020-04-17 DIAGNOSIS — I5042 Chronic combined systolic (congestive) and diastolic (congestive) heart failure: Secondary | ICD-10-CM

## 2020-04-17 NOTE — Progress Notes (Signed)
Spoke with pt for pre-op call. Pt has hx of CHF and is followed by Dr. [name]. He denies any other cardiac history and states he is not diabetic.   Covid test done today, result pending.  Pt states he's been in quarantine since the test was done and understands that he stays in quarantine until he comes to the hospital on Monday.  EKG - 02/18/20 Echo - 01/29/20 CXR - 02/02/20 Stress test - 02/27/20  Instructed pt not to smoke 24 hours prior to the procedure and to hold his Vitamins as of today prior to surgery. He voiced understanding.

## 2020-04-17 NOTE — Progress Notes (Signed)
Cardiology Office Note:    Date:  04/17/2020   ID:  Fernando [name], DOB 1955/11/04, MRN [id number]  PCP:  [name], MD  Cardiologist:  Buford Dresser, MD  Referring MD: [name], MD   CC: follow up  History of Present Illness:    Fernando [name] is a 65 y.o. male with a hx of nonischemic cardiomyopathy diagnosed 10/2019 who is seen for follow up.  Cardiac history: admitted 10/2019, found to have cardiomyopathy and concern for LV thrombus. We discussed further evaluation of cardiomyopathy and thrombus at length, declined while admitted. Lexiscan myoview negative as outpatient, declined further evaluation/treatment of possible LV thrombus.  Today: Feels like he is getting stronger. Fluid pill makes him urinate. Drinks 3-4 cups of coffee in AM in addition to his lasix, usually goes to the bathroom all morning then stops. Weights have been stable, other than some food weight around Christmas.  Has Bailey's in his coffee a few times/month, otherwise avoiding alcohol. Smoking about 1/2 ppd.  Tolerating losartan, BP at other doctor's visits has been 110/60.   Has endoscopy 1/17 to evaluate pancreas mass.  Denies chest pain, shortness of breath at rest or with normal exertion. No PND, orthopnea, LE edema or unexpected weight gain. No syncope or palpitations.  Past Medical History:  Diagnosis Date  . Alcohol dependence ([place])   . Arthritis   . Cataract   . Chronic systolic (congestive) heart failure (HCC)    EF 25-30% in 10/2019  . Dyslipidemia   . Essential hypertension   . History of kidney stones   . Hyperlipidemia   . Hypertension   . Pneumonia   . Prosthetic shoulder infection, initial encounter [facility])     Past Surgical History:  Procedure Laterality Date  . COLONOSCOPY    . COLONOSCOPY     multiple  . ESOPHAGOGASTRODUODENOSCOPY (EGD) WITH PROPOFOL N/A 01/31/2020   Procedure: ESOPHAGOGASTRODUODENOSCOPY (EGD) WITH PROPOFOL;  Surgeon: Doran Stabler,  MD;  Location: [facility];  Service: Gastroenterology;  Laterality: N/A;  . HERNIA REPAIR  1990  . KNEE SURGERY  1975   left men. repair  . KNEE SURGERY  1975  . REVERSE SHOULDER ARTHROPLASTY Right 06/21/2018   Procedure: REVERSE SHOULDER ARTHROPLASTY;  Surgeon: Hiram Gash, MD;  [facility];  Service: Orthopedics;  Laterality: Right;  . ROTATOR CUFF REPAIR     right  . ROTATOR CUFF REPAIR    . TONSILLECTOMY    . TOTAL SHOULDER REPLACEMENT  2020  . UMBILICAL HERNIA REPAIR      Current Medications: Current Outpatient Medications on File Prior to Visit  Medication Sig  . albuterol (PROVENTIL) (2.5 MG/3ML) 0.083% nebulizer solution Take 3 mLs (2.5 mg total) by nebulization every 6 (six) hours as needed for wheezing.  Marland Kitchen allopurinol (ZYLOPRIM) 100 MG tablet Take 200 mg by mouth daily.   . carvedilol (COREG) 3.125 MG tablet Take 3.125 mg by mouth 2 (two) times daily.  . famotidine (PEPCID) 40 MG tablet Take 1 tablet (40 mg total) by mouth daily.  . folic acid (FOLVITE) 1 MG tablet Take 1 mg by mouth daily.  . furosemide (LASIX) 40 MG tablet Take 1 tablet (40 mg total) by mouth daily.  Marland Kitchen losartan (COZAAR) 25 MG tablet Take 0.5 tablets (12.5 mg total) by mouth daily.  . Multiple Vitamin (MULTIVITAMIN) tablet Take 1 tablet by mouth daily.   . potassium chloride (KLOR-CON) 10 MEQ tablet Take 2 tablets (20 mEq total) by mouth daily.  No current facility-administered medications on file prior to visit.     Allergies:   Patient has no known allergies.   Social History   Tobacco Use  . Smoking status: Current Every Day Smoker    Packs/day: 0.50    Years: 50.00    Pack years: 25.00  . Smokeless tobacco: Never Used  Vaping Use  . Vaping Use: Never used  Substance Use Topics  . Alcohol use: Yes    Alcohol/week: 14.0 standard drinks    Types: 14 Shots of liquor per week  . Drug use: Yes    Types: Marijuana    Family History: family history includes Heart failure in his  mother; Lung cancer in his father. There is no history of Colon cancer, Esophageal cancer, Stomach cancer, Liver cancer, Pancreatic cancer, or Rectal cancer.  ROS:   Please see the history of present illness.  Additional pertinent ROS otherwise unremarkable.  EKGs/Labs/Other Studies Reviewed:    The following studies were reviewed today: Myoview 02/27/20  The left ventricular ejection fraction is mildly decreased (45-54%).  Nuclear stress EF: 50%.  No T wave inversion was noted during stress.  There was no ST segment deviation noted during stress.  This is a low risk study.   No reversible ischemia. LVEF 50% with normal wall motion. This is a low risk study.  Echo 01/29/20 1. Cannot exclude apicoseptal mural thrombus. Left ventricular ejection  fraction, by estimation, is 25 to 30%. The left ventricle has severely  decreased function. The left ventricle demonstrates global hypokinesis.  Left ventricular diastolic parameters  are consistent with Grade I diastolic dysfunction (impaired relaxation).  2. Right ventricular systolic function is mildly reduced. The right  ventricular size is moderately enlarged. Tricuspid regurgitation signal is  inadequate for assessing PA pressure.  3. The mitral valve is normal in structure. No evidence of mitral valve  regurgitation.  4. The aortic valve is normal in structure. Aortic valve regurgitation is  not visualized. No aortic stenosis is present.  5. Aortic dilatation noted. There is mild dilatation of the aortic root,  measuring 39 mm.   Full echo 10/27/19 1. Suspect calcified apical false tendon vs organized thrombus at the  apex. Left ventricular ejection fraction, by estimation, is 25 to 30%. The  left ventricle has severely decreased function. The left ventricle  demonstrates global hypokinesis. There is  mild left ventricular hypertrophy. Left ventricular diastolic parameters  are consistent with Grade I diastolic  dysfunction (impaired relaxation).  Elevated left ventricular end-diastolic pressure. There is incoordinate  septal motion.  2. Right ventricular systolic function is mildly reduced. The right  ventricular size is normal. Tricuspid regurgitation signal is inadequate  for assessing PA pressure.  3. The mitral valve is abnormal. Mild mitral valve regurgitation.  4. The aortic valve is tricuspid. Aortic valve regurgitation is not  visualized.  5. The inferior vena cava is dilated in size with <50% respiratory  variability, suggesting right atrial pressure of 15 mmHg.   Conclusion(s)/Recommendation(s): Unable to exclude left ventricular  thrombus, although if present, appears organized/calcified. Would  recommend a repeat transthoracic echocardiogram with contrast. D/w  sonographer.   Limited echo 10/27/19 1. There is an apical filling defect that is not clearly linear and may  represent thrombus. Based on non-contrast images, this appears organized.  Consider additional imaging such as cMRI at some point to further  evaluate.. Left ventricular ejection  fraction, by estimation, is 25 to 30%. The left ventricle has severely  decreased function.  The left ventricle demonstrates global hypokinesis.   EKG:  EKG is personally reviewed.  The ekg ordered 02/18/20 demonstrates sinus rhythm at 94 bpm with RBBB  Recent Labs: 10/25/2019: B Natriuretic Peptide 349.4 01/26/2020: TSH 4.012 02/05/2020: Magnesium 1.6 03/07/2020: ALT 7; BUN 15; Creatinine, Ser 0.82; Hemoglobin 12.7; Platelets 248.0; Potassium 3.6; Sodium 134  Recent Lipid Panel    Component Value Date/Time   CHOL  07/16/2008 1914    92 (NOTE) ATP III Classification:      < 200        mg/dL        Desirable     200 - 239     mg/dL        Borderline High     >= 240        mg/dL        High    TRIG 258 (H) 07/26/2008 0315    Physical Exam:    VS:  BP 126/70   Pulse 72   Ht 5\' 7"  (1.702 m)   Wt 169 lb 9.6 oz (76.9 kg)   SpO2  98%   BMI 26.56 kg/m     Wt Readings from Last 3 Encounters:  04/17/20 169 lb 9.6 oz (76.9 kg)  03/07/20 162 lb (73.5 kg)  02/27/20 160 lb (72.6 kg)    GEN: Well nourished, well developed in no acute distress HEENT: Normal, moist mucous membranes NECK: No JVD CARDIAC: regular rhythm, normal S1 and S2, no rubs or gallops. No murmur. VASCULAR: Radial and DP pulses 2+ bilaterally. No carotid bruits RESPIRATORY:  Clear to auscultation without rales, wheezing or rhonchi  ABDOMEN: Soft, non-tender, non-distended MUSCULOSKELETAL:  Ambulates independently SKIN: Warm and dry, no edema NEUROLOGIC:  Alert and oriented x 3. No focal neuro deficits noted. PSYCHIATRIC:  Normal affect   ASSESSMENT:    1. NICM (nonischemic cardiomyopathy) ([place])   2. Chronic combined systolic and diastolic heart failure ([place])   3. Tobacco abuse   4. Cardiac risk counseling   5. Counseling on health promotion and disease prevention    PLAN:    Nonischemic cardiomyopathy, with chronic systolic and diastolic heart failure: high suspicion for alcohol induced -initially did not have cath due to thrombocytopenia.  -tolerating only very low dose carvedilol and losartan -myoview 02/2020 without evidence of ischemia -NYHA class I-II, discussed lifestyle recommendations today -continue low dose losartan and carvedilol  Possible LV thrombus:  -not able to exclude even with contrast. Most recent echo 01/2020 again cannot exclude -we have discussed cMRI to evaluate. He has elevated risk of bleeding with macrocytic anemia, portal gastropathy, recent anemia requiring transfusion -after shared decision making, he does not with to pursue additional imaging and does not wish to trial anticoagulation -counseled on stroke symptoms, other symptoms that need immediate medical attention  Sinus tachycardia: improved -continue medications as above  Tobacco abuse: smoking 1/2 ppd, not interested in quitting  Alcohol abuse:  with prior hospitalization. Now only occasional use.  Cardiovascular risk counseling and prevention -recommend heart healthy/Mediterranean diet, with whole grains, fruits, vegetable, fish, lean meats, nuts, and olive oil. Limit salt. -recommend moderate walking, 3-5 times/week for 30-50 minutes each session. Aim for at least 150 minutes.week. Goal should be pace of 3 miles/hours, or walking 1.5 miles in 30 minutes -recommend avoidance of tobacco products. Avoid excess alcohol.  Plan for follow up: 6 mos or sooner as needed  Buford Dresser, MD, PhD, [facility]   Medication Adjustments/Labs and Tests  Ordered: Current medicines are reviewed at length with the patient today.  Concerns regarding medicines are outlined above.  No orders of the defined types were placed in this encounter.  No orders of the defined types were placed in this encounter.   Patient Instructions   Follow-Up: At [facility], you and your health needs are our priority.  As part of our continuing mission to provide you with exceptional heart care, we have created designated Provider Care Teams.  These Care Teams include your primary Cardiologist (physician) and Advanced Practice Providers (APPs -  Physician Assistants and Nurse Practitioners) who all work together to provide you with the care you need, when you need it.  We recommend signing up for the patient portal called "MyChart".  Sign up information is provided on this After Visit Summary.  MyChart is used to connect with patients for Virtual Visits (Telemedicine).  Patients are able to view lab/test results, encounter notes, upcoming appointments, etc.  Non-urgent messages can be sent to your provider as well.   To learn more about what you can do with MyChart, go to [email or web address].    Your next appointment:   6 month(s)  The format for your next appointment:   In Person  Provider:   Buford Dresser, MD       Signed, Buford Dresser, MD PhD 04/17/2020    [place]

## 2020-04-17 NOTE — Patient Instructions (Signed)

## 2020-04-18 ENCOUNTER — Telehealth: Payer: Self-pay | Admitting: Internal Medicine

## 2020-04-18 ENCOUNTER — Telehealth: Payer: Self-pay

## 2020-04-18 LAB — SARS CORONAVIRUS 2 (TAT 6-24 HRS): SARS Coronavirus 2: NEGATIVE

## 2020-04-18 NOTE — Telephone Encounter (Signed)
reached the pt to discuss case for Monday in the event we have snow. Per Dr Rush Landmark he will be at the hospital for his cases and the patients may remain as planned.  The pt wants to leave as planned and states he will make it in despite the weather.  He has been left as scheduled.

## 2020-04-18 NOTE — Telephone Encounter (Signed)
Spoke with the pt and all his questions were answered.

## 2020-04-18 NOTE — Progress Notes (Signed)
Pre op call done for endo procedure Monday 04/21/20. Patient states he has been quarantined since covid testing, will be NPO after midnight and clear liquids until 5 am, and patient confirmed he had a ride home post procedure. Patient given our direct number to call on Monday if unable to come due to inclement weather. All questions addressed.

## 2020-04-18 NOTE — Telephone Encounter (Signed)
Pt called stating that he was returning Patty's call. Pls call him again.

## 2020-04-19 NOTE — Telephone Encounter (Signed)
Thank you. GM 

## 2020-04-21 ENCOUNTER — Encounter (HOSPITAL_COMMUNITY): Payer: Self-pay | Admitting: Gastroenterology

## 2020-04-21 ENCOUNTER — Ambulatory Visit (HOSPITAL_COMMUNITY): Payer: Medicare Other | Admitting: Anesthesiology

## 2020-04-21 ENCOUNTER — Encounter (HOSPITAL_COMMUNITY)
Admission: RE | Disposition: A | Discharge status: Home / Self Care | Payer: Self-pay | Source: Ambulatory Visit | Attending: Gastroenterology

## 2020-04-21 ENCOUNTER — Ambulatory Visit (HOSPITAL_COMMUNITY)
Admission: RE | Admit: 2020-04-21 | Discharge: 2020-04-21 | Disposition: A | Discharge status: Home / Self Care | Payer: Medicare Other | Source: Ambulatory Visit | Attending: Gastroenterology | Admitting: Gastroenterology

## 2020-04-21 DIAGNOSIS — K2971 Gastritis, unspecified, with bleeding: Secondary | ICD-10-CM | POA: Diagnosis not present

## 2020-04-21 DIAGNOSIS — K2980 Duodenitis without bleeding: Secondary | ICD-10-CM | POA: Diagnosis not present

## 2020-04-21 DIAGNOSIS — K8689 Other specified diseases of pancreas: Secondary | ICD-10-CM

## 2020-04-21 DIAGNOSIS — Z8249 Family history of ischemic heart disease and other diseases of the circulatory system: Secondary | ICD-10-CM | POA: Insufficient documentation

## 2020-04-21 DIAGNOSIS — K2951 Unspecified chronic gastritis with bleeding: Secondary | ICD-10-CM | POA: Insufficient documentation

## 2020-04-21 DIAGNOSIS — I5022 Chronic systolic (congestive) heart failure: Secondary | ICD-10-CM | POA: Insufficient documentation

## 2020-04-21 DIAGNOSIS — Z801 Family history of malignant neoplasm of trachea, bronchus and lung: Secondary | ICD-10-CM | POA: Insufficient documentation

## 2020-04-21 DIAGNOSIS — I110 Hypertensive heart disease with heart failure: Secondary | ICD-10-CM | POA: Diagnosis not present

## 2020-04-21 DIAGNOSIS — K269 Duodenal ulcer, unspecified as acute or chronic, without hemorrhage or perforation: Secondary | ICD-10-CM | POA: Insufficient documentation

## 2020-04-21 DIAGNOSIS — K862 Cyst of pancreas: Secondary | ICD-10-CM | POA: Diagnosis present

## 2020-04-21 HISTORY — PX: BIOPSY: SHX5522

## 2020-04-21 HISTORY — PX: ESOPHAGOGASTRODUODENOSCOPY (EGD) WITH PROPOFOL: SHX5813

## 2020-04-21 HISTORY — PX: EUS: SHX5427

## 2020-04-21 SURGERY — UPPER ENDOSCOPIC ULTRASOUND (EUS) LINEAR
Anesthesia: Monitor Anesthesia Care

## 2020-04-21 MED ORDER — DEXMEDETOMIDINE (PRECEDEX) IN NS 20 MCG/5ML (4 MCG/ML) IV SYRINGE
PREFILLED_SYRINGE | INTRAVENOUS | Status: DC | PRN
  Administered 2020-04-21: 8 ug via INTRAVENOUS
  Administered 2020-04-21: 4 ug via INTRAVENOUS
  Administered 2020-04-21: 8 ug via INTRAVENOUS

## 2020-04-21 MED ORDER — PROPOFOL 500 MG/50ML IV EMUL
INTRAVENOUS | Status: DC | PRN
  Administered 2020-04-21: 200 ug/kg/min via INTRAVENOUS
  Administered 2020-04-21: 150 ug/kg/min via INTRAVENOUS

## 2020-04-21 MED ORDER — OMEPRAZOLE 40 MG PO CPDR: 40.0000 mg | DELAYED_RELEASE_CAPSULE | Freq: Two times a day (BID) | ORAL | 5 refills | Status: DC

## 2020-04-21 MED ORDER — LACTATED RINGERS IV SOLN: INTRAVENOUS | Status: DC

## 2020-04-21 MED ORDER — LIDOCAINE 2% (20 MG/ML) 5 ML SYRINGE
INTRAMUSCULAR | Status: DC | PRN
  Administered 2020-04-21: 40 mg via INTRAVENOUS

## 2020-04-21 MED ORDER — PHENYLEPHRINE HCL-NACL 10-0.9 MG/250ML-% IV SOLN
INTRAVENOUS | Status: DC | PRN
  Administered 2020-04-21: 40 ug/min via INTRAVENOUS

## 2020-04-21 MED ORDER — PHENYLEPHRINE 40 MCG/ML (10ML) SYRINGE FOR IV PUSH (FOR BLOOD PRESSURE SUPPORT)
PREFILLED_SYRINGE | INTRAVENOUS | Status: DC | PRN
  Administered 2020-04-21: 80 ug via INTRAVENOUS

## 2020-04-21 MED ORDER — PROPOFOL 10 MG/ML IV BOLUS
INTRAVENOUS | Status: DC | PRN
  Administered 2020-04-21 (×3): 40 mg via INTRAVENOUS

## 2020-04-21 MED ORDER — SODIUM CHLORIDE 0.9 % IV SOLN: INTRAVENOUS | Status: DC

## 2020-04-21 NOTE — Anesthesia Preprocedure Evaluation (Addendum)
Anesthesia Evaluation  Patient identified by MRN, date of birth, ID band Patient awake    Reviewed: Allergy & Precautions, NPO status , Patient's Chart, lab work & pertinent test results  History of Anesthesia Complications Negative for: history of anesthetic complications  Airway Mallampati: II  TM Distance: >3 FB Neck ROM: Full    Dental no notable dental hx. (+) Dental Advisory Given   Pulmonary Current Smoker and Patient abstained from smoking.,    Pulmonary exam normal        Cardiovascular hypertension, Pt. on home beta blockers and Pt. on medications +CHF  Normal cardiovascular exam  Study Highlights Perfusion study    The left ventricular ejection fraction is mildly decreased (45-54%).  Nuclear stress EF: 50%.  No T wave inversion was noted during stress.  There was no ST segment deviation noted during stress.  This is a low risk study.   No reversible ischemia. LVEF 50% with normal wall motion. This is a low risk study.    ECHO: 1. Cannot exclude apicoseptal mural thrombus. Left ventricular ejection fraction, by estimation, is 25 to 30%. The left ventricle has severely decreased function. The left ventricle demonstrates global hypokinesis. Left ventricular diastolic parameters are consistent with Grade I diastolic dysfunction (impaired relaxation). 2. Right ventricular systolic function is mildly reduced. The right ventricular size is moderately enlarged. Tricuspid regurgitation signal is inadequate for assessing PA pressure. 3. The mitral valve is normal in structure. No evidence of mitral valve regurgitation. 4. The aortic valve is normal in structure. Aortic valve regurgitation is not visualized. No aortic stenosis is present. 5. Aortic dilatation noted. There is mild dilatation of the aortic root, measuring 39 mm.   Neuro/Psych PSYCHIATRIC DISORDERS negative neurological ROS      GI/Hepatic negative GI ROS, (+)     substance abuse  alcohol use,   Endo/Other  negative endocrine ROS  Renal/GU negative Renal ROS     Musculoskeletal negative musculoskeletal ROS (+)   Abdominal   Peds  Hematology negative hematology ROS (+) anemia , Dyslipidemia   Anesthesia Other Findings melena, upper GI bleed  Reproductive/Obstetrics                           Anesthesia Physical  Anesthesia Plan  ASA: III  Anesthesia Plan: MAC   Post-op Pain Management:    Induction: Intravenous  PONV Risk Score and Plan: 0 and Propofol infusion and Treatment may vary due to age or medical condition  Airway Management Planned: Nasal Cannula  Additional Equipment:   Intra-op Plan:   Post-operative Plan:   Informed Consent: I have reviewed the patients History and Physical, chart, labs and discussed the procedure including the risks, benefits and alternatives for the proposed anesthesia with the patient or authorized representative who has indicated his/her understanding and acceptance.     Dental advisory given  Plan Discussed with: CRNA and Anesthesiologist  Anesthesia Plan Comments:        Anesthesia Quick Evaluation

## 2020-04-21 NOTE — Anesthesia Postprocedure Evaluation (Signed)
Anesthesia Post Note  Patient: Fernando [name]  Procedure(s) Performed: UPPER ENDOSCOPIC ULTRASOUND (EUS) LINEAR (N/A ) BIOPSY     Patient location during evaluation: Endoscopy Anesthesia Type: MAC Level of consciousness: awake and alert Pain management: pain level controlled Vital Signs Assessment: post-procedure vital signs reviewed and stable Respiratory status: spontaneous breathing, nonlabored ventilation, respiratory function stable and patient connected to nasal cannula oxygen Cardiovascular status: stable and blood pressure returned to baseline Postop Assessment: no apparent nausea or vomiting Anesthetic complications: no   No complications documented.  Last Vitals:  Vitals:   04/21/20 1610 04/21/20 1615  BP: (!) 153/64 129/83  Pulse: 73 67  Resp: 20 (!) 22  Temp:    SpO2: 98% 100%    Last Pain:  Vitals:   04/21/20 1615  TempSrc:   PainSc: 0-No pain                 Catalina Gravel

## 2020-04-21 NOTE — H&P (Signed)
GASTROENTEROLOGY PROCEDURE H&P NOTE   Primary Care Physician: [name], MD  HPI: Fernando [name] is a 65 y.o. 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  Past Medical History:  Diagnosis Date  . Alcohol dependence ([place])   . Arthritis   . Cataract   . Chronic systolic (congestive) heart failure (HCC)    EF 25-30% in 10/2019  . Dyslipidemia   . Essential hypertension   . History of kidney stones   . Hyperlipidemia   . Hypertension   . Pneumonia   . Prosthetic shoulder infection, initial encounter [facility])    Past Surgical History:  Procedure Laterality Date  . COLONOSCOPY    . COLONOSCOPY     multiple  . ESOPHAGOGASTRODUODENOSCOPY (EGD) WITH PROPOFOL N/A 01/31/2020   Procedure: ESOPHAGOGASTRODUODENOSCOPY (EGD) WITH PROPOFOL;  Surgeon: Doran Stabler, MD;  Location: [facility];  Service: Gastroenterology;  Laterality: N/A;  . HERNIA REPAIR  1990  . KNEE SURGERY  1975   left men. repair  . KNEE SURGERY  1975  . REVERSE SHOULDER ARTHROPLASTY Right 06/21/2018   Procedure: REVERSE SHOULDER ARTHROPLASTY;  Surgeon: Hiram Gash, MD;  Location: [facility];  Service: Orthopedics;  Laterality: Right;  . ROTATOR CUFF REPAIR     right  . ROTATOR CUFF REPAIR    . TONSILLECTOMY    . TOTAL SHOULDER REPLACEMENT  2020  . UMBILICAL HERNIA REPAIR     Current Facility-Administered Medications  Medication Dose Route Frequency Provider Last Rate Last Admin  . 0.9 %  sodium chloride infusion   Intravenous Continuous Mansouraty, Telford Nab., MD       No Known Allergies Family History  Problem Relation Age of Onset  . Heart failure Mother   . Lung cancer Father   . Colon cancer Neg Hx   . Esophageal cancer Neg Hx   . Stomach cancer Neg Hx   . Liver cancer Neg Hx   . Pancreatic cancer Neg Hx   . Rectal cancer Neg Hx    Social History   Socioeconomic History  . Marital status: Single    Spouse name: Not on file  . Number of children: Not  on file  . Years of education: Not on file  . Highest education level: Not on file  Occupational History  . Occupation: retired  Tobacco Use  . Smoking status: Current Every Day Smoker    Packs/day: 0.50    Years: 50.00    Pack years: 25.00  . Smokeless tobacco: Never Used  Vaping Use  . Vaping Use: Never used  Substance and Sexual Activity  . Alcohol use: Not Currently    Alcohol/week: 14.0 standard drinks    Types: 14 Shots of liquor per week  . Drug use: Not Currently    Types: Marijuana  . Sexual activity: Not on file  Other Topics Concern  . 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 BMET No results for input(s): NA, K, CL, CO2, GLUCOSE, BUN, CREATININE, CALCIUM in the last 72 hours. LFT No results for input(s): PROT, ALBUMIN, AST, ALT, ALKPHOS, BILITOT, BILIDIR, IBILI in the last 72 hours. PT/INR No results for input(s): LABPROT, INR in the last 72 hours.   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  The risks of an EUS including intestinal perforation, bleeding, infection, aspiration, and medication effects were discussed as was the possibility it may not give a definitive diagnosis if a biopsy is performed.  When a biopsy of the pancreas is done as part of the  EUS, there is an additional risk of pancreatitis at the rate of about 1-2%.  It was explained that procedure related pancreatitis is typically mild, although it can be severe and even life threatening, which is why we do not perform random pancreatic biopsies and only biopsy a lesion/area we feel is concerning enough to warrant the risk.  The risks and benefits of endoscopic evaluation were discussed with the patient; these include but are not limited to the risk of perforation, infection, bleeding, missed lesions, lack of diagnosis, severe illness requiring hospitalization, as well as anesthesia and sedation related illnesses.  The patient is agreeable to proceed.    Justice Britain, MD [facility] Office # [phone]

## 2020-04-21 NOTE — Transfer of Care (Signed)
Immediate Anesthesia Transfer of Care Note  Patient: PANAGIOTIS OELKERS  Procedure(s) Performed: UPPER ENDOSCOPIC ULTRASOUND (EUS) LINEAR (N/A ) BIOPSY  Patient Location: PACU  Anesthesia Type:MAC  Level of Consciousness: awake and alert   Airway & Oxygen Therapy: Patient Spontanous Breathing  Post-op Assessment: Report given to RN and Post -op Vital signs reviewed and stable  Post vital signs: Reviewed and stable  Last Vitals:  Vitals Value Taken Time  BP 113/70 04/21/20 1532  Temp    Pulse 84 04/21/20 1533  Resp 20 04/21/20 1533  SpO2 100 % 04/21/20 1533  Vitals shown include unvalidated device data.  Last Pain:  Vitals:   04/21/20 1532  TempSrc:   PainSc: 0-No pain         Complications: No complications documented.

## 2020-04-21 NOTE — Discharge Instructions (Signed)
YOU HAD AN ENDOSCOPIC PROCEDURE TODAY: Refer to the procedure report and other information in the discharge instructions given to you for any specific questions about what was found during the examination. If this information does not answer your questions, please call [facility] office at [phone] to clarify.   YOU SHOULD EXPECT: Some feelings of bloating in the abdomen. Passage of more gas than usual. Walking can help get rid of the air that was put into your GI tract during the procedure and reduce the bloating. If you had a lower endoscopy (such as a colonoscopy or flexible sigmoidoscopy) you may notice spotting of blood in your stool or on the toilet paper. Some abdominal soreness may be present for a day or two, also.  DIET: Your first meal following the procedure should be a light meal and then it is ok to progress to your normal diet. A half-sandwich or bowl of soup is an example of a good first meal. Heavy or fried foods are harder to digest and may make you feel nauseous or bloated. Drink plenty of fluids but you should avoid alcoholic beverages for 24 hours. If you had a esophageal dilation, please see attached instructions for diet.    ACTIVITY: Your care partner should take you home directly after the procedure. You should plan to take it easy, moving slowly for the rest of the day. You can resume normal activity the day after the procedure however YOU SHOULD NOT DRIVE, use power tools, machinery or perform tasks that involve climbing or major physical exertion for 24 hours (because of the sedation medicines used during the test).   SYMPTOMS TO REPORT IMMEDIATELY: A gastroenterologist can be reached at any hour. Please call [phone]  for any of the following symptoms:   Following upper endoscopy (EGD, EUS, ERCP, esophageal dilation) Vomiting of blood or coffee ground material  New, significant abdominal pain  New, significant chest pain or pain under the shoulder blades  Painful or  persistently difficult swallowing  New shortness of breath  Black, tarry-looking or red, bloody stools  FOLLOW UP:  If any biopsies were taken you will be contacted by phone or by letter within the next 1-3 weeks. Call [phone]  if you have not heard about the biopsies in 3 weeks.  Please also call with any specific questions about appointments or follow up tests.  

## 2020-04-21 NOTE — Anesthesia Procedure Notes (Signed)
Procedure Name: MAC Date/Time: 04/21/2020 2:25 PM Performed by: Reece Agar, CRNA Pre-anesthesia Checklist: Patient identified, Emergency Drugs available, Suction available, Patient being monitored and Timeout performed Patient Re-evaluated:Patient Re-evaluated prior to induction Oxygen Delivery Method: Nasal cannula

## 2020-04-21 NOTE — Op Note (Signed)
[facility] Patient Name: Fernando [name] Procedure Date : 04/21/2020 MRN: [id number] Attending MD: Justice Britain , MD Date of Birth: 10-27-1955 CSN: [id number] Age: 65 Admit Type: Outpatient Procedure:                Upper EUS Indications:              Pancreatic cyst on CT scan, Pancreatic cyst on MRCP Providers:                Justice Britain, MD, Jeanella Cara, RN,                            Nelia Shi, RN, William Dalton, Technician Referring MD:             Docia Chuck. Henrene Pastor, MD, [name] MD Medicines:                Monitored Anesthesia Care Complications:            No immediate complications. Estimated Blood Loss:     Estimated blood loss was minimal. Procedure:                Pre-Anesthesia Assessment:                           - Prior to the procedure, a History and Physical                            was performed, and patient medications and                            allergies were reviewed. The patient's tolerance of                            previous anesthesia was also reviewed. The risks                            and benefits of the procedure and the sedation                            options and risks were discussed with the patient.                            All questions were answered, and informed consent                            was obtained. Prior Anticoagulants: The patient has                            taken no previous anticoagulant or antiplatelet                            agents. ASA Grade Assessment: III - A patient with                            severe systemic disease. After reviewing the risks  and benefits, the patient was deemed in                            satisfactory condition to undergo the procedure.                           After obtaining informed consent, the endoscope was                            passed under direct vision. Throughout the                             procedure, the patient's blood pressure, pulse, and                            oxygen saturations were monitored continuously. The                            [id number]-H190 ([id number]) Olympus gastroscope was                            introduced through the mouth, and advanced to the                            second part of duodenum. The TJF- Q180V ([id number])                            Olympus duodenoscope was introduced through the                            mouth, and advanced to the second part of duodenum.                            The GF-[id number] ([id number]) Olympus Linear EUS was                            introduced through the mouth, and advanced to the                            duodenum for ultrasound examination from the                            stomach and duodenum. The upper EUS was                            accomplished without difficulty. The patient                            tolerated the procedure. Scope In: Scope Out: Findings:      ENDOSCOPIC FINDING: :      No gross lesions were noted in the entire esophagus. EGD scope passed       with ease. There was difficulty with passage of the Duodenoscope and the       Linear EUS that  required careful maneuvering and neck flexion/extension       to allow scope to pass. After which the endoscope was replaced at the       end of the EUS procedure and there was notation of a mucosal wrent just       below the UES from scope trauma/manipulation - no perforation.      Patchy moderate inflammation with hemorrhage characterized by erosions,       erythema, friability and granularity was found in the gastric fundus, in       the gastric body, at the incisura and in the gastric antrum.      No other gross lesions were noted in the entire examined stomach.       Biopsies were taken with a cold forceps for histology and Helicobacter       pylori testing.      Segmental moderate inflammation characterized by congestion (edema),       erosions,  erythema, friability and shallow ulcerations was found in the       duodenal bulb, in the first portion of the duodenum and in the second       portion of the duodenum. Biopsies for histology were taken with a cold       forceps for evaluation of celiac disease.      The major papilla was normal.      ENDOSONOGRAPHIC FINDING: :      An anechoic lesion suggestive of a cyst was identified in the pancreatic       tail. There does seem to be a communication with the very proximal       pancreatic duct within the tail into the cyst. The lesion measured 53 mm       by 45 mm in maximal cross-sectional diameter. There was a single       compartment thinly septated. The outer wall of the lesion was thick.       There was no associated solid mass to this region. There was small       amount of possible internal debris within the fluid-filled cavity. There       was evidence of a large blood vessel within the cyst itself that seems       to be a tributary off of the superior mesenteric artery - concerning for       possible pseudoaneurysm. Needle aspiration/biopsy not performed today       due to the concern of ruling out a pseudoaneurysm now in development       with the cyst.      Pancreatic parenchymal abnormalities were noted in the entire pancreas.       These consisted of hyperechoic strands.      The pancreatic duct had a normal endosonographic appearance in the       pancreatic head (PD = 2.0 mm), genu of the pancreas (PD = 2.0 mm), body       of the pancreas (PD = 1.0 mm) and tail of the pancreas (PD = 0.7 mm).      There was no sign of significant endosonographic abnormality in the       common bile duct (4.6 mm) and in the common hepatic duct (5.2 mm). An       unremarkable gallbladder was identified.      Endosonographic imaging of the ampulla showed no extrinsic compression,       intramural (subepithelial) lesion, mass, varices or wall thickening.  Endosonographic imaging in the  visualized portion of the liver showed no       lesion.      No malignant-appearing lymph nodes were visualized in the celiac region       (level 20), peripancreatic region and porta hepatis region.      The celiac region was visualized. Impression:               EGD Impression:                           - No gross lesions in esophagus. However after the                            completion of EUS portion, EGD replaced and there                            was finding of a mucosal wrent just below the UES.                           - Gastritis with hemorrhage. No other gross lesions                            in the stomach. Biopsied.                           - Duodenitis with small duodenal ulcers noted.                            Biopsied.                           - Normal major papilla.                           EUS Impression:                           - A cystic lesion was seen in the pancreatic tail.                            There was no overt component of solid                            tissue/mass-like area abutting this region.                            However, there was a concerning blood vessel                            crossing within the cyst itself concern for a                            possible pseudoaneurysm. Tissue/fluid has not been                            obtained due to  concern for pseudoaneurysm.                            However, the endosonographic appearance and                            enlarging presence compared to prior imaging is                            suggestive of a pancreatic pseudocyst more than a                            true malignant/pre-malignant solid-cystic mass.                            Query previous pancreatitis in setting of prior                            alcohol consumption.                           - Pancreatic parenchymal abnormalities consisting                            of hyperechoic strands were noted in the  entire                            pancreas.                           - The pancreatic duct had a normal endosonographic                            appearance in the pancreatic head, genu of the                            pancreas, body of the pancreas and tail of the                            pancreas.                           - There was no sign of significant pathology in the                            common bile duct and in the common hepatic duct.                           - No malignant-appearing lymph nodes were                            visualized in the celiac region (level 20),                            peripancreatic region and porta hepatis region. Recommendation:           -  The patient will be observed post-procedure,                            until all discharge criteria are met.                           - Discharge patient to home.                           - Patient has a contact number available for                            emergencies. The signs and symptoms of potential                            delayed complications were discussed with the                            patient. Return to normal activities tomorrow.                            Written discharge instructions were provided to the                            patient.                           - Full liquid diet.                           - Dilation diet as per protocol.                           - Please use Cepacol or Halls Lozenges +/-                            Chloraseptic spray for next 72-96 hours to aid in                            sore thoat should you experience this.                           - Observe patient's clinical course.                           - Await path results.                           - Start Omeprazole 40 mg twice daily.                           - Repeat EGD in 3-4 months to check on healing of                            duodenal ulcers and gastritis.                            -  No aspirin, ibuprofen, naproxen, or other                            non-steroidal anti-inflammatory drugs.                           - Recommend obtaining a repeat CT-Abdomen (Pancreas                            protocol) within the next few weeks to rule out a                            pseuodaneurysm within the cyst cavity that could                            require further evaluation.                           - Will obtain a CA19-9, though I am not convinced                            this is truly a pre-malignant or malignant lesion,                            I would like to further clarify this as well with                            the imaging to understand the risks of potential                            aspiration via EUS.                           - If patient is having symptoms of early satiety or                            progressive changes are noted in the CT scan, then                            query role of an EUS cystgastrostomy vs possible                            use of ERCP if there is concern for a ductal lesion.                           - The findings and recommendations were discussed                            with the patient.                           - The findings and recommendations were discussed  with the designated responsible adult. Procedure Code(s):        --- Professional ---                           [id number], Esophagogastroduodenoscopy, flexible,                            transoral; with endoscopic ultrasound examination                            limited to the esophagus, stomach or duodenum, and                            adjacent structures                           43239, Esophagogastroduodenoscopy, flexible,                            transoral; with biopsy, single or multiple Diagnosis Code(s):        --- Professional ---                           K29.71, Gastritis, unspecified, with bleeding                            K29.80, Duodenitis without bleeding                           K86.2, Cyst of pancreas                           K86.9, Disease of pancreas, unspecified                           I89.9, Noninfective disorder of lymphatic vessels                            and lymph nodes, unspecified CPT copyright 2019 [employer]. All rights reserved. The codes documented in this report are preliminary and upon coder review may  be revised to meet current compliance requirements. Justice Britain, MD 04/21/2020 3:54:37 PM Number of Addenda: 0

## 2020-04-22 ENCOUNTER — Other Ambulatory Visit: Payer: Self-pay | Admitting: Physician Assistant

## 2020-04-22 ENCOUNTER — Telehealth: Payer: Self-pay

## 2020-04-22 DIAGNOSIS — K862 Cyst of pancreas: Secondary | ICD-10-CM

## 2020-04-22 NOTE — Telephone Encounter (Signed)
-----   Message from Irving Copas., MD sent at 04/22/2020  3:58 AM EST ----- Regarding: RE: [facility] patient Fernando [name], Go ahead and get the CT abdomen pancreas protocol. Go ahead and get the CA 19-9. Schedule follow-up with me in clinic in the next 2 to 3 weeks. Thank you. GM ----- Message ----- From: Irene Shipper, MD Sent: 04/21/2020   9:40 PM EST To: [name], MD, Timothy Lasso, RN, # Subject: RE: [facility] patient                             Fernando [name], I will leave management of this issue entirely up to you. Thanks, JP  ----- Message ----- From: Irving Copas., MD Sent: 04/21/2020   4:18 PM EST To: [name], MD, Irene Shipper, MD, # Subject: [facility] patient                                 JP and [name], I hope you are well. Please review my EGD/EUS procedure note for full details.  The pancreas cyst looks to be enlarging but I see no evidence of a solid mass component to this.  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  With that being said, there is a large blood vessel that is crossing through the cyst itself with a tributary off of the SMA being noted.  I am very concerned that this could be a pseudoaneurysm.  With this being said, I did not feel comfortable aspirating the fluid or doing an AXIOS cyst gastrostomy.    We need to obtain a CT abdomen pancreas protocol to better understand the risk of this being a pseudoaneurysm such that he could require IR coiling.  He also needs to have a CA 19-9 obtained just to help [place] better understand the risk for potential need of aspiration for this cyst.  JP, if you have no concerns then I will have Fernando [name] move forward with ordering the CT scan and subsequently we can all discuss potential role of evaluation for cyst gastrostomy versus FNA if there is not finding of a true pseudoaneurysm and that this is just a arterial vessel crossing through. The CA 19-9 will need to be obtained at our office.  Fernando [name], once Dr. Henrene Pastor  response, you can move forward with placing the CT scan order for an abdomen with IV and oral contrast as a pancreas protocol CT.  Also go ahead and place the CA 19-9 is a future order that the patient will come in for at some point.  I will update you all with the final pathology as he may have H. pylori.  Thanks to all.  GM

## 2020-04-23 ENCOUNTER — Encounter (HOSPITAL_COMMUNITY): Payer: Self-pay | Admitting: Gastroenterology

## 2020-04-23 ENCOUNTER — Encounter: Payer: Self-pay | Admitting: Gastroenterology

## 2020-04-23 LAB — SURGICAL PATHOLOGY

## 2020-04-23 NOTE — Telephone Encounter (Signed)
The pt has been scheduled for CT abd pancreatic protocol on 05/01/20 at 8 am.  He should arrive at 745 am and have nothing after midnight to eat or drink. Will not need contrast will drink water.  Lab order entered for CA-19-9.  Follow up scheduled for 05/13/20 at 230 pm with Dr Rush Landmark.    Left message on machine to call back

## 2020-04-24 ENCOUNTER — Telehealth: Payer: Self-pay | Admitting: *Deleted

## 2020-04-24 ENCOUNTER — Telehealth: Payer: Self-pay | Admitting: Gastroenterology

## 2020-04-24 NOTE — Telephone Encounter (Signed)
Pt is requesting a call back regarding the results from his EGD [place] he had on 1/17.  Pt states Dr Henrene Pastor is his provider.

## 2020-04-24 NOTE — Telephone Encounter (Signed)
Notes received sent to referral for scheduling [facility] [phone]

## 2020-04-24 NOTE — Telephone Encounter (Signed)
I spoke with the pt and he has been advised of the CT scan as well as labs.  He was also given the appt information for his follow up with Dr Rush Landmark.  He was advised to call if he has any further questions or concerns. The pt has been advised of the information and verbalized understanding.

## 2020-04-24 NOTE — Telephone Encounter (Signed)
See alternate phone note 1/19.

## 2020-04-30 NOTE — Addendum Note (Signed)
Addendum  created 04/30/20 0955 by Catalina Gravel, MD   Intraprocedure Attestations deleted, Intraprocedure Staff edited

## 2020-05-01 ENCOUNTER — Other Ambulatory Visit: Payer: Self-pay

## 2020-05-01 ENCOUNTER — Ambulatory Visit (HOSPITAL_COMMUNITY)
Admission: RE | Admit: 2020-05-01 | Discharge: 2020-05-01 | Disposition: A | Discharge status: Home / Self Care | Payer: Medicare Other | Source: Ambulatory Visit | Attending: Gastroenterology | Admitting: Gastroenterology

## 2020-05-01 ENCOUNTER — Encounter (HOSPITAL_COMMUNITY): Payer: Self-pay

## 2020-05-01 DIAGNOSIS — K862 Cyst of pancreas: Secondary | ICD-10-CM | POA: Diagnosis present

## 2020-05-01 LAB — POCT I-STAT CREATININE: Creatinine, Ser: 0.9 mg/dL (ref 0.61–1.24)

## 2020-05-01 MED ORDER — IOHEXOL 300 MG/ML  SOLN
100.0000 mL | Freq: Once | INTRAMUSCULAR | Status: AC | PRN
  Administered 2020-05-01: 100 mL via INTRAVENOUS

## 2020-05-05 ENCOUNTER — Other Ambulatory Visit: Payer: Medicare Other

## 2020-05-05 DIAGNOSIS — K862 Cyst of pancreas: Secondary | ICD-10-CM

## 2020-05-06 LAB — CANCER ANTIGEN 19-9: CA 19-9: 9 U/mL (ref <34)

## 2020-05-13 ENCOUNTER — Encounter: Payer: Self-pay | Admitting: Gastroenterology

## 2020-05-13 ENCOUNTER — Ambulatory Visit (INDEPENDENT_AMBULATORY_CARE_PROVIDER_SITE_OTHER): Payer: Medicare Other | Admitting: Gastroenterology

## 2020-05-13 VITALS — BP 94/60 | HR 110 | Ht 65.5 in | Wt 173.5 lb

## 2020-05-13 DIAGNOSIS — K869 Disease of pancreas, unspecified: Secondary | ICD-10-CM | POA: Diagnosis not present

## 2020-05-13 DIAGNOSIS — K862 Cyst of pancreas: Secondary | ICD-10-CM | POA: Diagnosis not present

## 2020-05-13 DIAGNOSIS — R935 Abnormal findings on diagnostic imaging of other abdominal regions, including retroperitoneum: Secondary | ICD-10-CM

## 2020-05-13 DIAGNOSIS — K861 Other chronic pancreatitis: Secondary | ICD-10-CM

## 2020-05-13 NOTE — Patient Instructions (Signed)
Dr. Rush Landmark has recommended that you have a CT Scan Abdomen with Pancreas Protocol in 3-4 months( May 2022.) Office will call to schedule at a later time.   You will also be scheduled for a office follow-up with Dr. Rush Landmark after your CT scan.   If you have not heard from our office by May 15th 2022, please contact office and ask for Itzamar Traynor @ [phone].   If you are age 65 or older, your body mass index should be between 23-30. Your Body mass index is 28.43 kg/m. If this is out of the aforementioned range listed, please consider follow up with your Primary Care Provider.  Thank you for choosing me and [facility].  Dr. Rush Landmark

## 2020-05-16 ENCOUNTER — Encounter: Payer: Self-pay | Admitting: Gastroenterology

## 2020-05-16 DIAGNOSIS — K862 Cyst of pancreas: Secondary | ICD-10-CM | POA: Insufficient documentation

## 2020-05-16 DIAGNOSIS — R935 Abnormal findings on diagnostic imaging of other abdominal regions, including retroperitoneum: Secondary | ICD-10-CM | POA: Insufficient documentation

## 2020-05-16 DIAGNOSIS — K869 Disease of pancreas, unspecified: Secondary | ICD-10-CM | POA: Insufficient documentation

## 2020-05-16 NOTE — Progress Notes (Signed)
[facility]ISIT   Primary Care Provider [name], [place] [address] [phone]  Referring Provider Dr. Henrene Pastor  Patient Profile: Fernando [name] is a 65 y.o. male with a pmh significant for CHF, hypertension, hyperlipidemia, prior alcohol use/dependence, nephrolithiasis, colon polyps (TAs), pancreatic cyst.  The patient presents to the [facility] for an evaluation and management of problem(s) noted below:  Problem List 1. Cyst of pancreas   2. Pancreatic disease   3. Abnormal CT of the pancreas     History of Present Illness Please see initial consultation note and progress notes by Dr. Henrene Pastor for full details of HPI.  Interval History Today, the patient returns for a follow-up post recent endoscopic ultrasound and CT. at the time of his EUS, I found a blood vessel going through the cyst.  My concern was whether this could be a pseudoaneurysm versus an arterial crossing through the cyst in the setting of a recent pancreatitis.  Cross-sectional imaging was noted below did not show evidence of an overt pseudoaneurysm.  Patient states that he is done well and is having no symptoms or pain.  Upon further discussion with the patient he once again also describes no prior history of true pancreatitis pain that he can recall.  The imaging study suggest a slight decrease in the size of the cyst overall compared to prior imaging.  He denies any fevers or chills.  No early satiety.  He denies any nausea or vomiting.  He is not having any pain.  He is abstaining from alcohol at this time.  GI Review of Systems Positive as above Negative for dysphagia, odynophagia, change in bowel habits  Review of Systems General: Denies fevers/chills/weight loss unintentionally Cardiovascular: Denies chest pain/palpitations Pulmonary: Denies shortness of breath Gastroenterological: See HPI Genitourinary: Denies darkened  urine Hematological: Denies easy bruising/bleeding Dermatological: Denies jaundice Psychological: Mood is stable   Medications Current Outpatient Medications  Medication Sig Dispense Refill  . allopurinol (ZYLOPRIM) 100 MG tablet Take 200 mg by mouth daily.     . carvedilol (COREG) 3.125 MG tablet Take 3.125 mg by mouth 2 (two) times daily.    . famotidine (PEPCID) 40 MG tablet Take 1 tablet (40 mg total) by mouth daily. 30 tablet 0  . folic acid (FOLVITE) 1 MG tablet Take 1 mg by mouth daily.    . furosemide (LASIX) 40 MG tablet Take 1 tablet (40 mg total) by mouth daily. 30 tablet 11  . losartan (COZAAR) 25 MG tablet Take 0.5 tablets (12.5 mg total) by mouth daily. 15 tablet 11  . Multiple Vitamin (MULTIVITAMIN) tablet Take 1 tablet by mouth daily.     Marland Kitchen omeprazole (PRILOSEC) 40 MG capsule Take 1 capsule (40 mg total) by mouth 2 (two) times daily. 60 capsule 5  . albuterol (PROVENTIL) (2.5 MG/3ML) 0.083% nebulizer solution Take 3 mLs (2.5 mg total) by nebulization every 6 (six) hours as needed for wheezing. (Patient not taking: Reported on 05/13/2020) 75 mL 12   No current facility-administered medications for this visit.    Allergies No Known Allergies  Histories Past Medical History:  Diagnosis Date  . Alcohol dependence ([facility])   . Arthritis   . Cataract   . Chronic systolic (congestive) heart failure (HCC)    EF 25-30% in 10/2019  . Dyslipidemia   . Essential hypertension   . History of kidney stones   . Hyperlipidemia   . Hypertension   . Pneumonia   .  Prosthetic shoulder infection, initial encounter [facility])    Past Surgical History:  Procedure Laterality Date  . BIOPSY  04/21/2020   Procedure: BIOPSY;  Surgeon: Rush Landmark Telford Nab., MD;  Location: [facility];  Service: Gastroenterology;;  . COLONOSCOPY    . COLONOSCOPY     multiple  . ESOPHAGOGASTRODUODENOSCOPY (EGD) WITH PROPOFOL N/A 01/31/2020   Procedure: ESOPHAGOGASTRODUODENOSCOPY (EGD) WITH PROPOFOL;   Surgeon: Doran Stabler, MD;  Location: [facility];  Service: Gastroenterology;  Laterality: N/A;  . ESOPHAGOGASTRODUODENOSCOPY (EGD) WITH PROPOFOL N/A 04/21/2020   Procedure: ESOPHAGOGASTRODUODENOSCOPY (EGD) WITH PROPOFOL;  Surgeon: Rush Landmark Telford Nab., MD;  Location: [facility];  Service: Gastroenterology;  Laterality: N/A;  . EUS N/A 04/21/2020   Procedure: UPPER ENDOSCOPIC ULTRASOUND (EUS) LINEAR;  Surgeon: Irving Copas., MD;  Location: [facility];  Service: Gastroenterology;  Laterality: N/A;  . HERNIA REPAIR  1990  . KNEE SURGERY  1975   left men. repair  . KNEE SURGERY  1975  . REVERSE SHOULDER ARTHROPLASTY Right 06/21/2018   Procedure: REVERSE SHOULDER ARTHROPLASTY;  Surgeon: Hiram Gash, MD;  Location: [facility];  Service: Orthopedics;  Laterality: Right;  . ROTATOR CUFF REPAIR     right  . ROTATOR CUFF REPAIR    . TONSILLECTOMY    . TOTAL SHOULDER REPLACEMENT  2020  . UMBILICAL HERNIA REPAIR     Social History   Socioeconomic History  . Marital status: Single    Spouse name: Not on file  . Number of children: Not on file  . Years of education: Not on file  . Highest education level: Not on file  Occupational History  . Occupation: retired  Tobacco Use  . Smoking status: Current Every Day Smoker    Packs/day: 0.50    Years: 50.00    Pack years: 25.00  . Smokeless tobacco: Never Used  Vaping Use  . Vaping Use: Never used  Substance and Sexual Activity  . Alcohol use: Not Currently    Alcohol/week: 14.0 standard drinks    Types: 14 Shots of liquor per week  . Drug use: Not Currently    Types: Marijuana  . Sexual activity: Not on file  Other Topics Concern  . 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 Heart failure Mother   . Lung cancer Father   . Colon cancer Neg Hx   . Esophageal cancer Neg Hx   . Stomach cancer Neg Hx   . Liver cancer Neg Hx   . Pancreatic cancer Neg Hx   . Rectal cancer Neg Hx    I have reviewed his medical, social, and family history in detail and updated the electronic medical record as necessary.    PHYSICAL EXAMINATION  BP 94/60 (BP Location: Left Arm, Patient Position: Sitting, Cuff Size: Normal)   Pulse (!) 110   Ht 5' 5.5" (1.664 m) Comment: height measured without shoes  Wt 173 lb 8 oz (78.7 kg)   BMI 28.43 kg/m  Wt Readings from Last 3  Encounters:  05/13/20 173 lb 8 oz (78.7 kg)  04/21/20 167 lb (75.8 kg)  04/17/20 169 lb 9.6 oz (76.9 kg)  GEN: NAD, appears stated age, doesn't appear chronically ill PSYCH: Cooperative, without pressured speech EYE: Conjunctivae pink, sclerae anicteric ENT: Masked CV: Tachycardic without rubs or gallops RESP: No wheezing or rales appreciated GI: NABS, soft, rounded, NT/ND, without rebound or guarding, no HSM appreciated MSK/EXT: No lower extremity edema SKIN: No jaundice, no spider angiomata NEURO:  Alert & Oriented x 3, no focal deficits, no evidence of asterixis   REVIEW OF DATA  I reviewed the following data at the time of this encounter:  GI Procedures and Studies  January 2022 EGD/EUS EGD Impression: - No gross lesions in esophagus. However after the completion of EUS portion, EGD replaced and there was finding of a mucosal wrent just below the UES. - Gastritis with hemorrhage. No other gross lesions in the stomach. Biopsied. - Duodenitis with small duodenal ulcers noted. Biopsied. - Normal major papilla. EUS Impression: - A cystic lesion was seen in the pancreatic tail. There was no  overt component of solid tissue/mass-like area abutting this region. However, there was a concerning blood vessel crossing within the cyst itself concern for a possible pseudoaneurysm. Tissue/fluid has not been obtained due to concern for pseudoaneurysm. However, the endosonographic appearance and enlarging presence compared to prior imaging is suggestive of a pancreatic pseudocyst more than a true malignant/pre-malignant solid-cystic mass. Query previous pancreatitis in setting of prior alcohol consumption. - Pancreatic parenchymal abnormalities consisting of hyperechoic strands were noted in the entire pancreas. - The pancreatic duct had a normal endosonographic appearance in the pancreatic head, genu of the pancreas, body of the pancreas and tail of the pancreas. - There was no sign of significant pathology in the common bile duct and in the common hepatic duct. - No malignant-appearing lymph nodes were visualized in the celiac region (level 20), peripancreatic region and porta hepatis region.  Laboratory Studies  Reviewed those in epic  Imaging Studies  January 2022 CT abdomen with and without contrast IMPRESSION: 1. Slight decrease in the complex cystic lesion in the tail of pancreas. Lesion is contiguous with the lateral wall of the proximal stomach and displaces vascular anatomy in the splenic hilum without vascular complication. Interval decrease in size is reassuring in this may well represent a pseudocyst. Continued surveillance recommended. Consider follow-up CT in 3-6 months. 2. Right renal cyst. 3. Aortic Atherosclerosis (ICD10-I70.0).   ASSESSMENT  Mr. Cosma is a 65 y.o. male with a pmh significant for CHF, hypertension, hyperlipidemia, prior alcohol use/dependence, nephrolithiasis, colon polyps (TAs), pancreatic cyst.  The patient is seen today for evaluation and management of:  1. Cyst of pancreas   2. Pancreatic disease   3. Abnormal CT of the pancreas    The patient is  hemodynamically and clinically stable.  Follow-up of the imaging from recent endoscopic ultrasound and subsequent pancreas protocol CT abdomen shows that the previous cyst when remeasured looks to be slightly smaller in size.  Although be blood vessel that is going through the cyst it does not look like there is any pseudoaneurysm by our radiology team which is good.  The patient is completely asymptomatic.  Without any solid component to things I suspect that this is actually just a pseudocyst.  We did discuss the potential for a repeat endoscopic ultrasound with aspiration versus gastrostomy.  I think there is a risk of causing significant bleeding of the blood vessel if the  cyst gastrostomy is performed but it may also be present with an aspiration attempt as well as decompression of the cyst may lead to movement or fracture of the arterial wall.  As such, because the patient is doing and feeling well and he has had negative CA 19-9 markers, we are going to plan to monitor this closely over the course of the next few months.  If the cyst continues to decrease in size then the likelihood of being a pseudocyst is most likely and then we would monitor periodically thereafter.  If the cyst enlarges or stays the same in size we will consider aspiration of the cyst.  The risks of an EUS including intestinal perforation, bleeding, infection, aspiration, and medication effects were discussed as was the possibility it may not give a definitive diagnosis if a biopsy is performed.  When a biopsy of the pancreas is done as part of the EUS, there is an additional risk of pancreatitis at the rate of about 1-2%.  It was explained that procedure related pancreatitis is typically mild, although it can be severe and even life threatening, which is why we do not perform random pancreatic biopsies and only biopsy a lesion/area we feel is concerning enough to warrant the risk.  We will hold on that currently.  Plan will be for repeat  imaging in 4 months and follow-up in clinic with me or Dr. Henrene Pastor.  All patient questions were answered to the best of my ability, and the patient agrees to the aforementioned plan of action with follow-up as indicated.   PLAN  Repeat CT abdomen (pancreas protocol) in 4 months with follow-up in clinic thereafter Holding on EUS cyst aspiration/cyst gastrostomy for now but will consider based on follow-up imaging and/or if other symptoms develop in the interim   No orders of the defined types were placed in this encounter.   New Prescriptions   No medications on file   Modified Medications   No medications on file    Planned Follow Up Return in about 3 months (around 08/10/2020).   Total Time in Face-to-Face and in Coordination of Care for patient including independent/personal interpretation/review of prior testing, medical history, examination, medication adjustment, communicating results with the patient directly, and documentation with the EHR is 30 minutes.   Justice Britain, MD [facility] Office # [phone]

## 2020-08-01 ENCOUNTER — Encounter: Payer: Self-pay | Admitting: Internal Medicine

## 2020-08-16 ENCOUNTER — Inpatient Hospital Stay (HOSPITAL_COMMUNITY)
Admission: EM | Admit: 2020-08-16 | Discharge: 2020-08-29 | DRG: 377 | Disposition: A | Discharge status: Rehabilitation Facility | Payer: Medicare Other | Attending: Family Medicine | Admitting: Family Medicine

## 2020-08-16 ENCOUNTER — Emergency Department (HOSPITAL_COMMUNITY): Payer: Medicare Other

## 2020-08-16 ENCOUNTER — Other Ambulatory Visit: Payer: Self-pay

## 2020-08-16 ENCOUNTER — Encounter (HOSPITAL_COMMUNITY): Payer: Self-pay | Admitting: Emergency Medicine

## 2020-08-16 DIAGNOSIS — R578 Other shock: Secondary | ICD-10-CM | POA: Diagnosis not present

## 2020-08-16 DIAGNOSIS — F17210 Nicotine dependence, cigarettes, uncomplicated: Secondary | ICD-10-CM | POA: Diagnosis present

## 2020-08-16 DIAGNOSIS — D7589 Other specified diseases of blood and blood-forming organs: Secondary | ICD-10-CM | POA: Diagnosis present

## 2020-08-16 DIAGNOSIS — R531 Weakness: Secondary | ICD-10-CM

## 2020-08-16 DIAGNOSIS — D62 Acute posthemorrhagic anemia: Secondary | ICD-10-CM

## 2020-08-16 DIAGNOSIS — E785 Hyperlipidemia, unspecified: Secondary | ICD-10-CM | POA: Diagnosis present

## 2020-08-16 DIAGNOSIS — E871 Hypo-osmolality and hyponatremia: Secondary | ICD-10-CM | POA: Diagnosis not present

## 2020-08-16 DIAGNOSIS — Z87442 Personal history of urinary calculi: Secondary | ICD-10-CM

## 2020-08-16 DIAGNOSIS — J9811 Atelectasis: Secondary | ICD-10-CM | POA: Diagnosis not present

## 2020-08-16 DIAGNOSIS — F1026 Alcohol dependence with alcohol-induced persisting amnestic disorder: Secondary | ICD-10-CM | POA: Diagnosis not present

## 2020-08-16 DIAGNOSIS — J9601 Acute respiratory failure with hypoxia: Secondary | ICD-10-CM | POA: Diagnosis not present

## 2020-08-16 DIAGNOSIS — I110 Hypertensive heart disease with heart failure: Secondary | ICD-10-CM | POA: Diagnosis present

## 2020-08-16 DIAGNOSIS — N179 Acute kidney failure, unspecified: Secondary | ICD-10-CM

## 2020-08-16 DIAGNOSIS — F1010 Alcohol abuse, uncomplicated: Secondary | ICD-10-CM

## 2020-08-16 DIAGNOSIS — E872 Acidosis, unspecified: Secondary | ICD-10-CM

## 2020-08-16 DIAGNOSIS — K3189 Other diseases of stomach and duodenum: Secondary | ICD-10-CM | POA: Diagnosis present

## 2020-08-16 DIAGNOSIS — F1020 Alcohol dependence, uncomplicated: Secondary | ICD-10-CM | POA: Diagnosis present

## 2020-08-16 DIAGNOSIS — D696 Thrombocytopenia, unspecified: Secondary | ICD-10-CM | POA: Diagnosis present

## 2020-08-16 DIAGNOSIS — F10239 Alcohol dependence with withdrawal, unspecified: Secondary | ICD-10-CM | POA: Diagnosis not present

## 2020-08-16 DIAGNOSIS — R188 Other ascites: Secondary | ICD-10-CM

## 2020-08-16 DIAGNOSIS — R0602 Shortness of breath: Secondary | ICD-10-CM | POA: Diagnosis not present

## 2020-08-16 DIAGNOSIS — K254 Chronic or unspecified gastric ulcer with hemorrhage: Secondary | ICD-10-CM | POA: Diagnosis not present

## 2020-08-16 DIAGNOSIS — Z20822 Contact with and (suspected) exposure to covid-19: Secondary | ICD-10-CM | POA: Diagnosis present

## 2020-08-16 DIAGNOSIS — J811 Chronic pulmonary edema: Secondary | ICD-10-CM

## 2020-08-16 DIAGNOSIS — R29898 Other symptoms and signs involving the musculoskeletal system: Secondary | ICD-10-CM | POA: Diagnosis not present

## 2020-08-16 DIAGNOSIS — K922 Gastrointestinal hemorrhage, unspecified: Secondary | ICD-10-CM | POA: Diagnosis present

## 2020-08-16 DIAGNOSIS — I5032 Chronic diastolic (congestive) heart failure: Secondary | ICD-10-CM | POA: Diagnosis not present

## 2020-08-16 DIAGNOSIS — K7460 Unspecified cirrhosis of liver: Secondary | ICD-10-CM

## 2020-08-16 DIAGNOSIS — E861 Hypovolemia: Secondary | ICD-10-CM | POA: Diagnosis present

## 2020-08-16 DIAGNOSIS — Z96611 Presence of right artificial shoulder joint: Secondary | ICD-10-CM | POA: Diagnosis present

## 2020-08-16 DIAGNOSIS — E876 Hypokalemia: Secondary | ICD-10-CM | POA: Diagnosis not present

## 2020-08-16 DIAGNOSIS — Z8249 Family history of ischemic heart disease and other diseases of the circulatory system: Secondary | ICD-10-CM

## 2020-08-16 DIAGNOSIS — Z801 Family history of malignant neoplasm of trachea, bronchus and lung: Secondary | ICD-10-CM

## 2020-08-16 DIAGNOSIS — K264 Chronic or unspecified duodenal ulcer with hemorrhage: Secondary | ICD-10-CM | POA: Diagnosis present

## 2020-08-16 DIAGNOSIS — K863 Pseudocyst of pancreas: Secondary | ICD-10-CM | POA: Diagnosis present

## 2020-08-16 DIAGNOSIS — Z781 Physical restraint status: Secondary | ICD-10-CM

## 2020-08-16 DIAGNOSIS — K766 Portal hypertension: Secondary | ICD-10-CM | POA: Diagnosis present

## 2020-08-16 DIAGNOSIS — R296 Repeated falls: Secondary | ICD-10-CM | POA: Diagnosis present

## 2020-08-16 DIAGNOSIS — I10 Essential (primary) hypertension: Secondary | ICD-10-CM | POA: Diagnosis present

## 2020-08-16 DIAGNOSIS — F17200 Nicotine dependence, unspecified, uncomplicated: Secondary | ICD-10-CM | POA: Diagnosis not present

## 2020-08-16 DIAGNOSIS — K862 Cyst of pancreas: Secondary | ICD-10-CM | POA: Diagnosis not present

## 2020-08-16 DIAGNOSIS — I429 Cardiomyopathy, unspecified: Secondary | ICD-10-CM

## 2020-08-16 DIAGNOSIS — K7030 Alcoholic cirrhosis of liver without ascites: Secondary | ICD-10-CM | POA: Diagnosis not present

## 2020-08-16 DIAGNOSIS — F10230 Alcohol dependence with withdrawal, uncomplicated: Secondary | ICD-10-CM | POA: Diagnosis not present

## 2020-08-16 DIAGNOSIS — J441 Chronic obstructive pulmonary disease with (acute) exacerbation: Secondary | ICD-10-CM | POA: Diagnosis not present

## 2020-08-16 DIAGNOSIS — K250 Acute gastric ulcer with hemorrhage: Secondary | ICD-10-CM | POA: Diagnosis not present

## 2020-08-16 DIAGNOSIS — R0989 Other specified symptoms and signs involving the circulatory and respiratory systems: Secondary | ICD-10-CM

## 2020-08-16 DIAGNOSIS — K269 Duodenal ulcer, unspecified as acute or chronic, without hemorrhage or perforation: Secondary | ICD-10-CM | POA: Diagnosis not present

## 2020-08-16 LAB — CBC WITH DIFFERENTIAL/PLATELET
Abs Immature Granulocytes: 0.13 10*3/uL — ABNORMAL HIGH (ref 0.00–0.07)
Basophils Absolute: 0 10*3/uL (ref 0.0–0.1)
Basophils Relative: 0 %
Eosinophils Absolute: 0 10*3/uL (ref 0.0–0.5)
Eosinophils Relative: 0 %
HCT: 15.4 % — ABNORMAL LOW (ref 39.0–52.0)
Hemoglobin: 5.3 g/dL — CL (ref 13.0–17.0)
Immature Granulocytes: 2 %
Lymphocytes Relative: 9 %
Lymphs Abs: 0.6 10*3/uL — ABNORMAL LOW (ref 0.7–4.0)
MCH: 40.5 pg — ABNORMAL HIGH (ref 26.0–34.0)
MCHC: 34.4 g/dL (ref 30.0–36.0)
MCV: 117.6 fL — ABNORMAL HIGH (ref 80.0–100.0)
Monocytes Absolute: 0.7 10*3/uL (ref 0.1–1.0)
Monocytes Relative: 11 %
Neutro Abs: 4.9 10*3/uL (ref 1.7–7.7)
Neutrophils Relative %: 78 %
Platelets: 123 10*3/uL — ABNORMAL LOW (ref 150–400)
RBC: 1.31 MIL/uL — ABNORMAL LOW (ref 4.22–5.81)
RDW: 15.1 % (ref 11.5–15.5)
WBC: 6.3 10*3/uL (ref 4.0–10.5)
nRBC: 1.3 % — ABNORMAL HIGH (ref 0.0–0.2)

## 2020-08-16 LAB — COMPREHENSIVE METABOLIC PANEL
ALT: 40 U/L (ref 0–44)
AST: 37 U/L (ref 15–41)
Albumin: 3 g/dL — ABNORMAL LOW (ref 3.5–5.0)
Alkaline Phosphatase: 66 U/L (ref 38–126)
Anion gap: 19 — ABNORMAL HIGH (ref 5–15)
BUN: 18 mg/dL (ref 8–23)
CO2: 20 mmol/L — ABNORMAL LOW (ref 22–32)
Calcium: 8.7 mg/dL — ABNORMAL LOW (ref 8.9–10.3)
Chloride: 89 mmol/L — ABNORMAL LOW (ref 98–111)
Creatinine, Ser: 1.39 mg/dL — ABNORMAL HIGH (ref 0.61–1.24)
GFR, Estimated: 56 mL/min — ABNORMAL LOW (ref >60)
Glucose, Bld: 158 mg/dL — ABNORMAL HIGH (ref 70–99)
Potassium: 3.9 mmol/L (ref 3.5–5.1)
Sodium: 128 mmol/L — ABNORMAL LOW (ref 135–145)
Total Bilirubin: 2.3 mg/dL — ABNORMAL HIGH (ref 0.3–1.2)
Total Protein: 5.5 g/dL — ABNORMAL LOW (ref 6.5–8.1)

## 2020-08-16 LAB — POC OCCULT BLOOD, ED: Fecal Occult Bld: POSITIVE — AB

## 2020-08-16 LAB — ETHANOL: Alcohol, Ethyl (B): <10 mg/dL (ref <10)

## 2020-08-16 LAB — PREPARE RBC (CROSSMATCH)

## 2020-08-16 LAB — PROTIME-INR
INR: 1.2 (ref 0.8–1.2)
Prothrombin Time: 15.4 seconds — ABNORMAL HIGH (ref 11.4–15.2)

## 2020-08-16 LAB — GLUCOSE, CAPILLARY: Glucose-Capillary: 122 mg/dL — ABNORMAL HIGH (ref 70–99)

## 2020-08-16 LAB — RESP PANEL BY RT-PCR (FLU A&B, COVID) ARPGX2
Influenza A by PCR: NEGATIVE
Influenza B by PCR: NEGATIVE
SARS Coronavirus 2 by RT PCR: NEGATIVE

## 2020-08-16 LAB — LACTIC ACID, PLASMA: Lactic Acid, Venous: 6.1 mmol/L (ref 0.5–1.9)

## 2020-08-16 MED ORDER — PANTOPRAZOLE SODIUM 40 MG IV SOLR
40.0000 mg | Freq: Once | INTRAVENOUS | Status: AC
  Administered 2020-08-16: 40 mg via INTRAVENOUS
  Filled 2020-08-16: qty 40

## 2020-08-16 MED ORDER — SODIUM CHLORIDE 0.9 % IV BOLUS
1000.0000 mL | Freq: Once | INTRAVENOUS | Status: AC
  Administered 2020-08-16: 1000 mL via INTRAVENOUS

## 2020-08-16 MED ORDER — CHLORHEXIDINE GLUCONATE CLOTH 2 % EX PADS
6.0000 | MEDICATED_PAD | Freq: Every day | CUTANEOUS | Status: DC
  Administered 2020-08-17 – 2020-08-25 (×7): 6 via TOPICAL

## 2020-08-16 MED ORDER — SODIUM CHLORIDE 0.9 % IV BOLUS
1000.0000 mL | Freq: Once | INTRAVENOUS | Status: AC
Start: 2020-08-16 — End: 2020-08-16
  Administered 2020-08-16: 1000 mL via INTRAVENOUS

## 2020-08-16 MED ORDER — SODIUM CHLORIDE 0.9% IV SOLUTION
Freq: Once | INTRAVENOUS | Status: AC
  Administered 2020-08-16: 100 mL via INTRAVENOUS

## 2020-08-16 MED ORDER — THIAMINE HCL 100 MG/ML IJ SOLN
Freq: Every day | INTRAVENOUS | Status: DC
  Filled 2020-08-16: qty 1000

## 2020-08-16 MED ORDER — POLYETHYLENE GLYCOL 3350 17 G PO PACK: 17.0000 g | PACK | Freq: Every day | ORAL | Status: DC | PRN

## 2020-08-16 MED ORDER — DOCUSATE SODIUM 100 MG PO CAPS: 100.0000 mg | ORAL_CAPSULE | Freq: Two times a day (BID) | ORAL | Status: DC | PRN

## 2020-08-16 MED ORDER — DEXTROSE IN LACTATED RINGERS 5 % IV SOLN: INTRAVENOUS | Status: DC

## 2020-08-16 MED ORDER — SODIUM CHLORIDE 0.9 % IV SOLN
2.0000 g | INTRAVENOUS | Status: AC
  Administered 2020-08-17 – 2020-08-21 (×6): 2 g via INTRAVENOUS
  Filled 2020-08-16 (×2): qty 20
  Filled 2020-08-16: qty 2
  Filled 2020-08-16 (×2): qty 20
  Filled 2020-08-16: qty 2

## 2020-08-16 MED ORDER — SODIUM CHLORIDE 0.9 % IV SOLN: INTRAVENOUS | Status: DC

## 2020-08-16 MED ORDER — SODIUM CHLORIDE 0.9% IV SOLUTION
Freq: Once | INTRAVENOUS | Status: AC
Start: 2020-08-16 — End: 2020-08-16
  Administered 2020-08-16: 100 mL via INTRAVENOUS

## 2020-08-16 MED ORDER — ALBUTEROL SULFATE HFA 108 (90 BASE) MCG/ACT IN AERS
6.0000 | INHALATION_SPRAY | Freq: Once | RESPIRATORY_TRACT | Status: AC
  Administered 2020-08-16: 6 via RESPIRATORY_TRACT
  Filled 2020-08-16: qty 6.7

## 2020-08-16 MED ORDER — CHLORDIAZEPOXIDE HCL 5 MG PO CAPS
10.0000 mg | ORAL_CAPSULE | Freq: Three times a day (TID) | ORAL | Status: DC
  Administered 2020-08-17: 10 mg via ORAL
  Filled 2020-08-16: qty 2

## 2020-08-16 MED ORDER — PANTOPRAZOLE SODIUM 40 MG IV SOLR: 40.0000 mg | Freq: Two times a day (BID) | INTRAVENOUS | Status: DC

## 2020-08-16 MED ORDER — SODIUM CHLORIDE 0.9 % IV SOLN
8.0000 mg/h | INTRAVENOUS | Status: AC
  Administered 2020-08-17 – 2020-08-18 (×4): 8 mg/h via INTRAVENOUS
  Filled 2020-08-16 (×7): qty 80

## 2020-08-16 MED ORDER — SODIUM CHLORIDE 0.9 % IV SOLN
80.0000 mg | Freq: Once | INTRAVENOUS | Status: AC
  Administered 2020-08-16: 80 mg via INTRAVENOUS
  Filled 2020-08-16: qty 80

## 2020-08-16 NOTE — ED Notes (Signed)
Emergency release blood started at [facility]

## 2020-08-16 NOTE — ED Notes (Signed)
RN transported pt to CT 

## 2020-08-16 NOTE — ED Notes (Signed)
Pt receiving emergency release blood. Pt is not stable enough for orthostatic vitals

## 2020-08-16 NOTE — Progress Notes (Signed)
eLink Physician-Brief Progress Note Patient Name: Fernando [name] DOB: December 27, 1955 MRN: [id number]   Date of Service  08/16/2020  HPI/Events of Note  42M with history of heavy alcohol use and HFrEF (EF 25-30% in 10/2019) who is admitted with symptomatic anemia in setting of 2 days of melena. He is not on any anticoagulation.  Of note, in Jan 2022 the patient was suspected to have an LV thrombus but declined anticoagulation therapy.  Received 2L IVF and 2u pRBC resuscitation in ED for hypotension/shock.  On my evaluation the patient is alert and oriented. He is on 2L [place] with sats in mid-90s. MAP is now in 80s with SBP in 100s. HR is ~ 100 bpm. He is not in any distress. Patient denies any further melena since arriving to the hospital. He is making some urine per RN [facility] catheter in place).   eICU Interventions  # Neuro: - Monitor for signs/symptoms of EtOH withdrawal while inpatient. MV/thiamine.  # Cardiac: - Hemorrhagic shock: s/p resuscitation with pRBCs. PPI drip. Ceftriaxone empirically is OK as SBP ppx, though there is no definitive DX of cirrhosis at this time. GI consult for EGD (suspect upper GI source given melena and EtOH abuse history). Platelets and INR ok. - HFrEF: Hold home GDMT given active bleed. - Repeat CBC and lactic acid level to ensure latter is downtrending after blood product resuscitation.  # GI: GI bleed + pancreatic tail mass lesion. - GI consult and mgmt as above. - NPO.  # Pulmonary: - 2L [place] for now. Likely slightly overloaded in setting of 2L IVF + 2u PRBCs + HFrEF. - Hold off on diuresis for now, but will likely need this tomorrow.  # Renal: - Hyponatremia (mild): Likely secondary to hypovolemia in setting of GI bleed. Treat underlying cause. Repeat BMP in AM. - AKI: 2/2 GI bleed. Mgmt as above. No mIVF (HFrEF + s/p 2L crystalloid in ED).  DVT PPX: SCDs only (bleeding). GI PPX: Protonix drip (GI bleed).  Remainder of plan as per hospitalist H&P.      Intervention Category Evaluation Type: New Patient Evaluation  Charlott Rakes 08/16/2020, 11:04 PM

## 2020-08-16 NOTE — ED Triage Notes (Signed)
Pt bib [facility] for fall, weakness, and shortness of breath. Pt had a fall this AM, lost balance, denied LOC, no thinners, refused ems transport to hospital. Pt called out this afternoon for weakness and shortness of breath. SpO2 94% on RA, tachy in 110s ecg showed Right bundle branch block, hypotension 60s/30s, 18g LAC

## 2020-08-16 NOTE — ED Provider Notes (Signed)
[facility] EMERGENCY DEPARTMENT Provider Note   CSN: [id number] Arrival date & time: 08/16/20  1708     History Chief Complaint  Patient presents with  . Fall  . Hypotension    Fernando [name] is a 65 y.o. male.  HPI History of alcohol dependence.  Fell today.  He denies any injuries and he says he just lost his balance and does not disclose further details about the fall when asked.  He has had 2 days of dark black stool.  He says he takes a blood thinner, states he takes it because his doctor tells him, and after verbalizing a full list of blood thinners thinks he is taking Xarelto.  On chart review, it does not look like he takes a blood thinner and in January of this year he was offered a blood thinner by his cardiologist after concern for LV thrombus and declined it.  He denies syncope.  He has generalized weakness for 2 days.  No chest pain.  He does have a little bit of difficulty breathing, denies asthma or COPD.  Hypotensive on arrival; level 5 caveat applies    Past Medical History:  Diagnosis Date  . Alcohol dependence ([place])   . Arthritis   . Cataract   . Chronic systolic (congestive) heart failure (HCC)    EF 25-30% in 10/2019  . Dyslipidemia   . Essential hypertension   . History of kidney stones   . Hyperlipidemia   . Hypertension   . Pneumonia   . Prosthetic shoulder infection, initial encounter [facility])     Patient Active Problem List   Diagnosis Date Noted  . Acute GI bleeding 08/16/2020  . Abnormal CT of the abdomen 05/16/2020  . Pancreatic disease 05/16/2020  . Cyst of pancreas 05/16/2020  . Acute on chronic combined systolic and diastolic CHF (congestive heart failure) ([place]) 02/05/2020  . Anemia of chronic disease   . Alcoholic hepatitis without ascites   . Portal hypertensive gastropathy ([place])   . Pancreatic mass   . Sinus tachycardia   . AKI (acute kidney injury) ([place]) 01/27/2020  . Fall at home, initial encounter 01/26/2020  .  Hypotension 01/26/2020  . Tobacco dependence 01/26/2020  . Dislocation of prosthetic shoulder joint, initial encounter ([place])   . Essential hypertension   . Dyslipidemia   . Alcohol dependence ([place])   . Pneumonitis   . Generalized weakness 10/25/2019  . Alcoholic hepatitis [id number]  . Sinus tachycardia 10/25/2019  . Hyperammonemia ([place]) 10/25/2019  . Alcohol withdrawal ([place]) 10/25/2019  . Acute hypoxemic respiratory failure ([place]) 10/25/2019  . Macrocytic anemia 10/25/2019  . Thrombocytopenia ([place]) 10/25/2019  . Hypertension   . Gout   . Tobacco abuse   . Stasis dermatitis of both legs 09/04/2018  . Medication monitoring encounter 08/09/2018  . 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 Rotator cuff tear arthropathy, right 06/21/2018    Past Surgical History:  Procedure Laterality Date  . BIOPSY  04/21/2020   Procedure: BIOPSY;  Surgeon: Rush Landmark Telford Nab., MD;  Location: [place];  Service: Gastroenterology;;  . COLONOSCOPY    . COLONOSCOPY     multiple  . ESOPHAGOGASTRODUODENOSCOPY (EGD) WITH PROPOFOL N/A 01/31/2020   Procedure: ESOPHAGOGASTRODUODENOSCOPY (EGD) WITH PROPOFOL;  Surgeon: Doran Stabler, MD;  Location: [place];  Service: Gastroenterology;  Laterality: N/A;  . ESOPHAGOGASTRODUODENOSCOPY (EGD) WITH PROPOFOL N/A 04/21/2020   Procedure: ESOPHAGOGASTRODUODENOSCOPY (EGD) WITH PROPOFOL;  Surgeon: Rush Landmark Telford Nab., MD;  Location: [place];  Service: Gastroenterology;  Laterality: N/A;  . EUS N/A 04/21/2020   Procedure: UPPER ENDOSCOPIC ULTRASOUND (EUS) LINEAR;  Surgeon: Irving Copas., MD;  Location: [place];  Service: Gastroenterology;  Laterality: N/A;  . HERNIA REPAIR  1990  . KNEE SURGERY  1975   left men. repair  . KNEE SURGERY  1975  . REVERSE SHOULDER ARTHROPLASTY Right 06/21/2018   Procedure: REVERSE SHOULDER ARTHROPLASTY;  Surgeon: Hiram Gash, MD;  Location: [facility];  Service: Orthopedics;  Laterality:  Right;  . ROTATOR CUFF REPAIR     right  . ROTATOR CUFF REPAIR    . TONSILLECTOMY    . TOTAL SHOULDER REPLACEMENT  2020  . UMBILICAL HERNIA REPAIR         Family History  Problem Relation Age of Onset  . Heart failure Mother   . Lung cancer Father   . Colon cancer Neg Hx   . Esophageal cancer Neg Hx   . Stomach cancer Neg Hx   . Liver cancer Neg Hx   . Pancreatic cancer Neg Hx   . Rectal cancer Neg Hx   . Inflammatory bowel disease Neg Hx     Social History   Tobacco Use  . Smoking status: Current Every Day Smoker    Packs/day: 0.50    Years: 50.00    Pack years: 25.00  . Smokeless tobacco: Never Used  Vaping Use  . Vaping Use: Never used  Substance Use Topics  . Alcohol use: Not Currently    Alcohol/week: 14.0 standard drinks    Types: 14 Shots of liquor per week  . Drug use: Not Currently    Types: Marijuana    Home Medications Prior to Admission medications   Medication Sig Start Date End Date Taking? Authorizing Provider  albuterol (PROVENTIL) (2.5 MG/3ML) 0.083% nebulizer solution Take 3 mLs (2.5 mg total) by nebulization every 6 (six) hours as needed for wheezing. Patient not taking: Reported on 05/13/2020 02/05/20   Debbe Odea, MD  allopurinol (ZYLOPRIM) 100 MG tablet Take 200 mg by mouth daily.  08/09/18   [provider]  carvedilol (COREG) 3.125 MG tablet Take 3.125 mg by mouth 2 (two) times daily. 12/03/19   [provider]  famotidine (PEPCID) 40 MG tablet Take 1 tablet (40 mg total) by mouth daily. 02/06/20   Debbe Odea, MD  folic acid (FOLVITE) 1 MG tablet Take 1 mg by mouth daily. 12/03/19   [provider]  furosemide (LASIX) 40 MG tablet Take 1 tablet (40 mg total) by mouth daily. 02/05/20 02/04/21  Debbe Odea, MD  losartan (COZAAR) 25 MG tablet Take 0.5 tablets (12.5 mg total) by mouth daily. 11/16/19   Buford Dresser, MD  Multiple Vitamin (MULTIVITAMIN) tablet Take 1 tablet by mouth daily.     [provider]  omeprazole (PRILOSEC) 40 MG capsule Take 1 capsule (40 mg total) by mouth 2 (two) times daily. 04/21/20 04/21/21  Mansouraty, Telford Nab., MD    Allergies    Patient has no known allergies.  Review of Systems   Review of Systems  Constitutional: Negative for chills and fever.  HENT: Negative for ear pain and sore throat.   Eyes: Negative for pain and visual disturbance.  Respiratory: Negative for cough and shortness of breath.   Cardiovascular: Negative for chest pain and palpitations.  Gastrointestinal: Negative for abdominal pain, nausea and vomiting.  Genitourinary: Negative for dysuria and hematuria.  Musculoskeletal: Negative for arthralgias and back pain.  Skin: Negative for color  change and rash.  Neurological: Negative for seizures and syncope.  All other systems reviewed and are negative.   Physical Exam Updated Vital Signs BP 107/60   Pulse (!) 105   Temp 98.1 F (36.7 C) (Oral)   Resp (!) 21   Ht 5\' 6"  (1.676 m)   Wt 72.6 kg   SpO2 99%   BMI 25.82 kg/m   Physical Exam Vitals and nursing note reviewed. Exam conducted with a chaperone present.  Constitutional:      Appearance: He is well-developed. He is ill-appearing.  HENT:     Head: Normocephalic and atraumatic.     Comments: Conjunctival pallor is present. Eyes:     Conjunctiva/sclera: Conjunctivae normal.  Cardiovascular:     Rate and Rhythm: Normal rate and regular rhythm.     Heart sounds: No murmur heard.   Pulmonary:     Effort: No respiratory distress.     Breath sounds: Wheezing present.  Abdominal:     Palpations: Abdomen is soft.     Tenderness: There is no abdominal tenderness.  Genitourinary:    Prostate: Normal.     Rectum: Normal.     Comments: Melena on DRE Musculoskeletal:        General: No swelling or deformity.     Cervical back: Neck supple.  Skin:    General: Skin is warm and dry.  Neurological:     General: No focal deficit present.     Mental Status: He  is alert.     Cranial Nerves: No cranial nerve deficit.  Psychiatric:        Thought Content: Thought content normal.        Judgment: Judgment normal.   Scalp atraumatic Forehead stable to palpation PERRL EOMI bl Midface stable nontender No intraoral injury  C-spine nontender T-spine nontender L-spine nontender No stepoffs or deformity  Clavicles stable nontender bilaterally Chest wall stable to AP and lat compression Abdominal pain nontender Weak bilateral radial pulses  ED Results / Procedures / Treatments   Labs (all labs ordered are listed, but only abnormal results are displayed) Labs Reviewed  COMPREHENSIVE METABOLIC PANEL - Abnormal; Notable for the following components:      Result Value   Sodium 128 (*)    Chloride 89 (*)    CO2 20 (*)    Glucose, Bld 158 (*)    Creatinine, Ser 1.39 (*)    Calcium 8.7 (*)    Total Protein 5.5 (*)    Albumin 3.0 (*)    Total Bilirubin 2.3 (*)    GFR, Estimated 56 (*)    Anion gap 19 (*)    All other components within normal limits  CBC WITH DIFFERENTIAL/PLATELET - Abnormal; Notable for the following components:   RBC 1.31 (*)    Hemoglobin 5.3 (*)    HCT 15.4 (*)    MCV 117.6 (*)    MCH 40.5 (*)    Platelets 123 (*)    nRBC 1.3 (*)    Lymphs Abs 0.6 (*)    Abs Immature Granulocytes 0.13 (*)    All other components within normal limits  PROTIME-INR - Abnormal; Notable for the following components:   Prothrombin Time 15.4 (*)    All other components within normal limits  LACTIC ACID, PLASMA - Abnormal; Notable for the following components:   Lactic Acid, Venous 6.1 (*)    All other components within normal limits  POC OCCULT BLOOD, ED - Abnormal; Notable for the following components:  Fecal Occult Bld POSITIVE (*)    All other components within normal limits  RESP PANEL BY RT-PCR (FLU A&B, COVID) ARPGX2  ETHANOL  LACTIC ACID, PLASMA  CBC  BASIC METABOLIC PANEL  MAGNESIUM  PHOSPHORUS  HEMOGLOBIN AND  HEMATOCRIT, BLOOD  LIPID PANEL  HEMOGLOBIN A1C  TYPE AND SCREEN  PREPARE RBC (CROSSMATCH)  PREPARE RBC (CROSSMATCH)    EKG None  Radiology CT Head Wo Contrast  Result Date: 08/16/2020 CLINICAL DATA:  Weakness with fall, no loss of consciousness. EXAM: CT HEAD WITHOUT CONTRAST TECHNIQUE: Contiguous axial images were obtained from the base of the skull through the vertex without intravenous contrast. COMPARISON:  January 26, 2020 FINDINGS: Brain: No evidence of acute large vascular territory infarction, hemorrhage, hydrocephalus, extra-axial collection or mass lesion/mass effect. Stable mild age related global parenchymal volume loss. Similar mild burden chronic small-vessel white matter ischemic change. 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 Vascular: No hyperdense vessel. Atherosclerotic calcifications of the internal carotid and vertebral arteries at the skull base. Skull: Stable round hyperdense 1.2 cm subcutaneous nodule posterior to the right occiput, likely sebaceous cyst. Negative for fracture or focal lesion. Sinuses/Orbits: Mucosal thickening of the right maxillary sinus is again visualized. Small left mastoid effusion. Other: Dental hardware. IMPRESSION: 1. No acute intracranial findings. 2. Stable mild age related global parenchymal volume loss and chronic small-vessel white matter ischemic change. 3. Small left mastoid effusion. Electronically Signed   By: Dahlia Bailiff MD   On: 08/16/2020 18:58   DG Chest Port 1 View  Result Date: 08/16/2020 CLINICAL DATA:  Fall, hypotension EXAM: PORTABLE CHEST 1 VIEW COMPARISON:  02/02/2020 FINDINGS: The heart size and mediastinal contours are within normal limits. Redemonstrated coarse, fibrotic opacity at the bilateral lung bases. No acute appearing airspace opacity. Status post right shoulder reverse total arthroplasty. IMPRESSION: Redemonstrated coarse, fibrotic opacity at the bilateral lung bases. No acute appearing  airspace opacity. Electronically Signed   By: Eddie Candle M.D.   On: 08/16/2020 18:26    Procedures .Critical Care Performed by: Aris Lot, MD Authorized by: Isla Pence, MD   Critical care provider statement:    Critical care time (minutes):  35   Critical care start time:  08/16/2020 5:25 PM   Critical care end time:  08/16/2020 6:00 PM   Critical care was necessary to treat or prevent imminent or life-threatening deterioration of the following conditions:  Shock   Critical care was time spent personally by me on the following activities:  Development of treatment plan with patient or surrogate, evaluation of patient's response to treatment, examination of patient and re-evaluation of patient's condition   I assumed direction of critical care for this patient from another provider in my specialty: no     Care discussed with: admitting provider      Medications Ordered in ED Medications  sodium chloride 0.9 % bolus 1,000 mL (0 mLs Intravenous Stopped 08/16/20 1901)    And  0.9 %  sodium chloride infusion ( Intravenous New Bag/Given 08/16/20 2027)  0.9 %  sodium chloride infusion (Manually program via Guardrails IV Fluids) (has no administration in time range)  docusate sodium (COLACE) capsule 100 mg (has no administration in time range)  polyethylene glycol (MIRALAX / GLYCOLAX) packet 17 g (has no administration in time range)  dextrose 5 % in lactated ringers infusion (has no administration in time range)  sodium chloride 0.9 % 1,000 mL with thiamine 469 mg, folic acid 1 mg, multivitamins adult 10 mL  infusion (has no administration in time range)  chlordiazePOXIDE (LIBRIUM) capsule 10 mg (has no administration in time range)  pantoprazole (PROTONIX) 80 mg in sodium chloride 0.9 % 100 mL IVPB (has no administration in time range)  pantoprazole (PROTONIX) 80 mg in sodium chloride 0.9 % 100 mL (0.8 mg/mL) infusion (has no administration in time range)  pantoprazole (PROTONIX)  injection 40 mg (has no administration in time range)  cefTRIAXone (ROCEPHIN) 2 g in sodium chloride 0.9 % 100 mL IVPB (has no administration in time range)  0.9 %  sodium chloride infusion (Manually program via Guardrails IV Fluids) ( Intravenous Stopped 08/16/20 2003)  albuterol (VENTOLIN HFA) 108 (90 Base) MCG/ACT inhaler 6 puff (6 puffs Inhalation Given 08/16/20 1839)  pantoprazole (PROTONIX) injection 40 mg (40 mg Intravenous Given 08/16/20 1840)  sodium chloride 0.9 % bolus 1,000 mL (1,000 mLs Intravenous New Bag/Given 08/16/20 2011)    ED Course  I have reviewed the triage vital signs and the nursing notes.  Pertinent labs & imaging results that were available during my care of the patient were reviewed by me and considered in my medical decision making (see chart for details).  Clinical Course as of 08/16/20 2147  Sat Aug 16, 2020  2145 [facility] Chest [place] 1 View [AS]    Clinical Course User Index [AS] Aris Lot, MD   MDM Rules/Calculators/A&P                           Patient presents after fall.  Has generalized weakness.  Hypotensive in shock on arrival.  DRE with melena.  Upper GI bleed suspected.  Given the hypotension, emergency release PRBCs transfused.  Patient lucid and verbal consent obtained.  No evidence of injury on exam, however given that the patient is a poor historian will obtain CT head.  Will give Protonix IV given evidence of upper GI bleed.  No abdominal tenderness or distention.  Wheezing without history of interstitial lung disease; chest x-ray obtained.  EKG obtained.  Interpretation below.  Labs show lactic acidosis to 6.1, hemoglobin 5.3, sodium 128, creatinine 1.39 which appears to be in AKI for this patient. I reviewed the patient's EKG and note sinus rhythm with a right bundle branch block and anterior T wave inversions.  Troponin is mildly elevated consistent with his hemorrhagic shock.  Will trend but ACS seems less likely given his history and  physical.  I reviewed the patient's chest x-ray and see bilateral scarring at the bases without a focal infectious opacity or interstitial edema. CT head reviewed does not show any acute intracranial hemorrhage. Repeat evaluation after transfusion of blood the patient seems significantly improved.  His blood pressures initially improved with the first unit of PRBCs but then down trended into hypotension again.  He remained lucid and was watching TV and said that he felt better.  Discussed with the gastroenterologist initially and then again after the second unit of blood was about halfway infused.  Patient again had response in his pressures to 107/60, continue to look well and states that he felt better.  He was admitted to the ICU for the repeat hypotension and I updated the gastroenterologist who plans for scope in the morning.  Protonix and fluids were also given.  Final Clinical Impression(s) / ED Diagnoses Final diagnoses:  GI bleed    Rx / DC Orders ED Discharge Orders    None       Aris Lot, MD 08/16/20  2147    Isla Pence, MD 08/16/20 2240

## 2020-08-16 NOTE — H&P (Signed)
Fernando [name] is an 65 y.o. male.   Chief Complaint: Generalized weakness, fatigue HPI:  Mr. Fernando [name] is a 65 year old gentleman with PMH as outlined below. He presented to the ED with compliant of generalized weakness and fatigue. He reported that he has fallen 3 times last week and once today prior to presentation. He denied head trauma. He reported exertional dysnoea but no chest pain. Patient denied taking blood thinners however he reported black stools for 2 days.  Patient admitted to drinking 1/2 a bottle of bourbon daily.  Patient was found to be hypotensive upon arrival to the ED. He received 2 liters of NS bolus. His hemoglobin came back to be 5.3 and his occult blood was positive. He was given uncrossed matched blood because of refractory hypotension. Hence patient was referred to [facility] team for admission. I have seen and examined the patient.  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  Past Medical History:  Diagnosis Date  . Alcohol dependence ([place])   . Arthritis   . Cataract   . Chronic systolic (congestive) heart failure (HCC)    EF 25-30% in 10/2019  . Dyslipidemia   . Essential hypertension   . History of kidney stones   . Hyperlipidemia   . Hypertension   . Pneumonia   . Prosthetic shoulder infection, initial encounter [facility])     Past Surgical History:  Procedure Laterality Date  . BIOPSY  04/21/2020   Procedure: BIOPSY;  Surgeon: Rush Landmark Telford Nab., MD;  Location: [place];  Service: Gastroenterology;;  . COLONOSCOPY    . COLONOSCOPY     multiple  . ESOPHAGOGASTRODUODENOSCOPY (EGD) WITH PROPOFOL N/A 01/31/2020   Procedure: ESOPHAGOGASTRODUODENOSCOPY (EGD) WITH PROPOFOL;  Surgeon: Doran Stabler, MD;  Location: [place];  Service: Gastroenterology;  Laterality: N/A;  . ESOPHAGOGASTRODUODENOSCOPY (EGD) WITH PROPOFOL N/A 04/21/2020   Procedure:  ESOPHAGOGASTRODUODENOSCOPY (EGD) WITH PROPOFOL;  Surgeon: Rush Landmark Telford Nab., MD;  Location: [place];  Service: Gastroenterology;  Laterality: N/A;  . EUS N/A 04/21/2020   Procedure: UPPER ENDOSCOPIC ULTRASOUND (EUS) LINEAR;  Surgeon: Irving Copas., MD;  Location: [place];  Service: Gastroenterology;  Laterality: N/A;  . HERNIA REPAIR  1990  . KNEE SURGERY  1975   left men. repair  . KNEE SURGERY  1975  . REVERSE SHOULDER ARTHROPLASTY Right 06/21/2018   Procedure: REVERSE SHOULDER ARTHROPLASTY;  Surgeon: Hiram Gash, MD;  Location: [facility];  Service: Orthopedics;  Laterality: Right;  . ROTATOR CUFF REPAIR     right  . ROTATOR CUFF REPAIR    . TONSILLECTOMY    . TOTAL SHOULDER REPLACEMENT  2020  . UMBILICAL HERNIA REPAIR      Family History  Problem Relation Age of Onset  . Heart failure Mother   . Lung cancer Father   . Colon cancer Neg Hx   . Esophageal cancer Neg Hx   . Stomach cancer Neg Hx   . Liver cancer Neg Hx   . Pancreatic cancer Neg Hx   . Rectal cancer Neg Hx   . Inflammatory bowel disease Neg Hx    Social History:  reports that he has been smoking. He has a 25.00 pack-year smoking history. He has never used smokeless tobacco. He reports previous alcohol use of about 14.0 standard drinks of alcohol per week. He reports previous drug use. Drug: Marijuana.  Allergies: No Known  Allergies  (Not in a hospital admission)   Results for orders placed or performed during the hospital encounter of 08/16/20 (from the past 48 hour(s))  Type and screen [place]     Status: None (Preliminary result)   Collection Time: 08/16/20  5:13 PM  Result Value Ref Range   ABO/RH(D) O POS    Antibody Screen NEG    Sample Expiration 08/19/2020,2359    Unit Number [id number]    Blood Component Type RED CELLS,LR    Unit division 00    Status of Unit ISSUED    Transfusion Status OK TO TRANSFUSE    Crossmatch Result COMPATIBLE    Unit tag  comment VERBAL ORDERS PER DR [facility]    Unit Number [id number]    Blood Component Type RED CELLS,LR    Unit division 00    Status of Unit ISSUED    Transfusion Status OK TO TRANSFUSE    Crossmatch Result      Compatible Performed at [facility], [place]. [address], [place], [place] [address]   Comprehensive metabolic panel     Status: Abnormal   Collection Time: 08/16/20  5:14 PM  Result Value Ref Range   Sodium 128 (L) 135 - 145 mmol/L   Potassium 3.9 3.5 - 5.1 mmol/L   Chloride 89 (L) 98 - 111 mmol/L   CO2 20 (L) 22 - 32 mmol/L   Glucose, Bld 158 (H) 70 - 99 mg/dL    Comment: Glucose reference range applies only to samples taken after fasting for at least 8 hours.   BUN 18 8 - 23 mg/dL   Creatinine, Ser 1.39 (H) 0.61 - 1.24 mg/dL   Calcium 8.7 (L) 8.9 - 10.3 mg/dL   Total Protein 5.5 (L) 6.5 - 8.1 g/dL   Albumin 3.0 (L) 3.5 - 5.0 g/dL   AST 37 15 - 41 U/L   ALT 40 0 - 44 U/L   Alkaline Phosphatase 66 38 - 126 U/L   Total Bilirubin 2.3 (H) 0.3 - 1.2 mg/dL   GFR, Estimated 56 (L) >60 mL/min    Comment: (NOTE) Calculated using the CKD-EPI Creatinine Equation (2021)    Anion gap 19 (H) 5 - 15    Comment: Performed at [facility], [place] [address], [place], [place] [address]  CBC WITH DIFFERENTIAL     Status: Abnormal   Collection Time: 08/16/20  5:14 PM  Result Value Ref Range   WBC 6.3 4.0 - 10.5 K/uL   RBC 1.31 (L) 4.22 - 5.81 MIL/uL   Hemoglobin 5.3 (LL) 13.0 - 17.0 g/dL    Comment: REPEATED TO VERIFY THIS CRITICAL RESULT HAS VERIFIED AND BEEN CALLED TO C MAS RN BY KYUNG BAEK ON 05 14 2022 AT 1823, AND HAS BEEN READ BACK.     HCT 15.4 (L) 39.0 - 52.0 %   MCV 117.6 (H) 80.0 - 100.0 fL   MCH 40.5 (H) 26.0 - 34.0 pg   MCHC 34.4 30.0 - 36.0 g/dL   RDW 15.1 11.5 - 15.5 %   Platelets 123 (L) 150 - 400 K/uL   nRBC 1.3 (H) 0.0 - 0.2 %   Neutrophils Relative % 78 %   Neutro Abs 4.9 1.7 - 7.7 K/uL   Lymphocytes Relative 9 %   Lymphs Abs 0.6 (L) 0.7 - 4.0 K/uL    Monocytes Relative 11 %   Monocytes Absolute 0.7 0.1 - 1.0 K/uL   Eosinophils Relative 0 %  Eosinophils Absolute 0.0 0.0 - 0.5 K/uL   Basophils Relative 0 %   Basophils Absolute 0.0 0.0 - 0.1 K/uL   Immature Granulocytes 2 %   Abs Immature Granulocytes 0.13 (H) 0.00 - 0.07 K/uL    Comment: Performed at [facility] [address] [place], [place] [address]  Protime-INR     Status: Abnormal   Collection Time: 08/16/20  5:14 PM  Result Value Ref Range   Prothrombin Time 15.4 (H) 11.4 - 15.2 seconds   INR 1.2 0.8 - 1.2    Comment: (NOTE) INR goal varies based on device and disease states. Performed at [facility], [place] [address] [place], [place] [address]   Lactic acid, plasma     Status: Abnormal   Collection Time: 08/16/20  5:14 PM  Result Value Ref Range   Lactic Acid, Venous 6.1 (HH) 0.5 - 1.9 mmol/L    Comment: CRITICAL RESULT CALLED TO, READ BACK BY AND VERIFIED WITH: COURTNEY MAS RN.@[email or web address]  ON 5.14.22 BY TCALDWELL MT. Performed at [facility] [address] [place],  [address]   Resp Panel by RT-PCR (Flu A&B, Covid) Nasopharyngeal Swab     Status: None   Collection Time: 08/16/20  5:15 PM   Specimen: Nasopharyngeal Swab; Nasopharyngeal(NP) swabs in vial transport medium  Result Value Ref Range   SARS Coronavirus 2 by RT PCR NEGATIVE NEGATIVE    Comment: (NOTE) SARS-CoV-2 target nucleic acids are NOT DETECTED.  The SARS-CoV-2 RNA is generally detectable in upper respiratory specimens during the acute phase of infection. The lowest concentration of SARS-CoV-2 viral copies this assay can detect is 138 copies/mL. A negative result does not preclude SARS-Cov-2 infection and should not be used as the sole basis for treatment or other patient management decisions. A negative result may occur with  improper specimen collection/handling, submission of specimen other than nasopharyngeal swab, presence of viral mutation(s) within the areas  targeted by this assay, and inadequate number of viral copies(<138 copies/mL). A negative result must be combined with clinical observations, patient history, and epidemiological information. The expected result is Negative.  Fact Sheet for Patients:  EntrepreneurPulse.com.au  Fact Sheet for Healthcare Providers:  IncredibleEmployment.be  This test is no t yet approved or cleared by the [place] FDA and  has been authorized for detection and/or diagnosis of SARS-CoV-2 by FDA under an Emergency Use Authorization (EUA). This EUA will remain  in effect (meaning this test can be used) for the duration of the COVID-19 declaration under Section 564(b)(1) of the Act, 21 U.S.C.section 360bbb-3(b)(1), unless the authorization is terminated  or revoked sooner.       Influenza A by PCR NEGATIVE NEGATIVE   Influenza B by PCR NEGATIVE NEGATIVE    Comment: (NOTE) The Xpert Xpress SARS-CoV-2/FLU/RSV plus assay is intended as an aid in the diagnosis of influenza from Nasopharyngeal swab specimens and should not be used as a sole basis for treatment. Nasal washings and aspirates are unacceptable for Xpert Xpress SARS-CoV-2/FLU/RSV testing.  Fact Sheet for Patients: EntrepreneurPulse.com.au  Fact Sheet for Healthcare Providers: IncredibleEmployment.be  This test is not yet approved or cleared by the [place] FDA and has been authorized for detection and/or diagnosis of SARS-CoV-2 by FDA under an Emergency Use Authorization (EUA). This EUA will remain in effect (meaning this test can be used) for the duration of the COVID-19 declaration under Section 564(b)(1) of the Act, 21 U.S.C. section 360bbb-3(b)(1), unless the authorization is terminated or revoked.  Performed at [facility], [place] [address], [place], [place] [address]   Ethanol     Status: None   Collection Time: 08/16/20  5:15 PM  Result Value  Ref Range   Alcohol, Ethyl (B) <10 <10 mg/dL    Comment: (NOTE) Lowest detectable limit for serum alcohol is 10 mg/dL.  For medical purposes only. Performed at [facility], [place] [address], [place], [place] [address]   POC occult blood, ED Provider will collect     Status: Abnormal   Collection Time: 08/16/20  5:25 PM  Result Value Ref Range   Fecal Occult Bld POSITIVE (A) NEGATIVE  Prepare RBC (crossmatch)     Status: None   Collection Time: 08/16/20  7:21 PM  Result Value Ref Range   Order Confirmation      ORDER PROCESSED BY BLOOD BANK Performed at [facility], [place] [address] [place], [place] [address]    CT Head Wo Contrast  Result Date: 08/16/2020 CLINICAL DATA:  Weakness with fall, no loss of consciousness. EXAM: CT HEAD WITHOUT CONTRAST TECHNIQUE: Contiguous axial images were obtained from the base of the skull through the vertex without intravenous contrast. COMPARISON:  January 26, 2020 FINDINGS: Brain: No evidence of acute large vascular territory infarction, hemorrhage, hydrocephalus, extra-axial collection or mass lesion/mass effect. Stable mild age related global parenchymal volume loss. Similar mild burden chronic small-vessel white matter ischemic change. 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 Vascular: No hyperdense vessel. Atherosclerotic calcifications of the internal carotid and vertebral arteries at the skull base. Skull: Stable round hyperdense 1.2 cm subcutaneous nodule posterior to the right occiput, likely sebaceous cyst. Negative for fracture or focal lesion. Sinuses/Orbits: Mucosal thickening of the right maxillary sinus is again visualized. Small left mastoid effusion. Other: Dental hardware. IMPRESSION: 1. No acute intracranial findings. 2. Stable mild age related global parenchymal volume loss and chronic small-vessel white matter ischemic change. 3. Small left mastoid effusion. Electronically Signed   By: Dahlia Bailiff MD   On:  08/16/2020 18:58   DG Chest Port 1 View  Result Date: 08/16/2020 CLINICAL DATA:  Fall, hypotension EXAM: PORTABLE CHEST 1 VIEW COMPARISON:  02/02/2020 FINDINGS: The heart size and mediastinal contours are within normal limits. Redemonstrated coarse, fibrotic opacity at the bilateral lung bases. No acute appearing airspace opacity. Status post right shoulder reverse total arthroplasty. IMPRESSION: Redemonstrated coarse, fibrotic opacity at the bilateral lung bases. No acute appearing airspace opacity. Electronically Signed   By: Eddie Candle M.D.   On: 08/16/2020 18:26    Review of Systems  Constitutional: Negative.   HENT: Negative.   Eyes: Negative.   Respiratory: Positive for shortness of breath. Negative for chest tightness.        Shortness of breath on exertion over the past 3 months  Cardiovascular: Negative for chest pain and leg swelling.  Gastrointestinal: Negative for abdominal pain, constipation, diarrhea, nausea, rectal pain and vomiting.  Endocrine: Negative for cold intolerance, heat intolerance, polydipsia and polyuria.  Genitourinary: Negative for difficulty urinating and dysuria.  Musculoskeletal: Negative for neck pain.       Multiple falls- 3 times last week and once today No injuries reported  Neurological: Positive for dizziness and light-headedness. Negative for headaches.  Psychiatric/Behavioral: Negative.   Patient has tested with COVID 19 in the past but is not vaccinated for COVID, Influenza nor Pneumonia.  Blood pressure 91/60, pulse (!) 108, temperature 98.1 F (36.7 C), temperature source Oral, resp. rate Marland Kitchen)  23, height 5\' 6"  (1.676 m), weight 72.6 kg, SpO2 96 %. Physical Exam Vitals reviewed.  HENT:     Head: Normocephalic and atraumatic.     Right Ear: Tympanic membrane normal.     Left Ear: Tympanic membrane normal.     Nose: Nose normal.     Mouth/Throat:     Mouth: Mucous membranes are dry.     Pharynx: Oropharynx is clear.  Eyes:     Extraocular  Movements: Extraocular movements intact.     Pupils: Pupils are equal, round, and reactive to light.  Cardiovascular:     Rate and Rhythm: Normal rate and regular rhythm.     Heart sounds: Normal heart sounds.  Pulmonary:     Effort: Pulmonary effort is normal.     Breath sounds: Normal breath sounds. No wheezing or rhonchi.  Abdominal:     Palpations: Abdomen is soft.     Tenderness: There is no abdominal tenderness.     Comments: obese  Musculoskeletal:        General: No swelling or tenderness. Normal range of motion.     Cervical back: Normal range of motion and neck supple.  Skin:    General: Skin is warm and dry.     Comments: Very dry lower extremities. Old dry abrasion on right shin  Neurological:     Mental Status: He is alert and oriented to person, place, and time.     Comments: No motor or sensory deficit. Poor historian/ memory  Psychiatric:        Mood and Affect: Mood normal.      Assessment/Plan 1. Hemorrhagic shock secondary to acute blood loss anemia. Patient has received 2 units of non cross matched blood because of hypotension. Plan: PRBC, will consider adding platelets, FFP and TXA if any further symptomatic bleeding.  2. Acute blood loss anemia secondary to upper GI bleed. Plan: transfusion as above.   3. 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 Plan: GI already consulted and will do urgent EGD if any further hemodynamic compromise. Protonix bolus and drip. Will empirically start Rocephin as patient likely to have cirrhosis given the  magnitude of his alcohol consumption.  4. Alcoholism/ dependence. Patient reported that he drinks "1/2 a bottle of bourbon daily" Plan: banana bag- transition to PO after EGD, early Librium to avoid DT's. IVF and start nutrition after EGD and when ok with GI team. Needs counseling when insight is evident. Encourage vaccination.  5. Lactic acidosis secondary to tissue  hypoperfusion from GI bleed. Plan: repeat lactic acid post transfusion.  6. Acute kidney injury secondary to hypovolemia.   - hx of nephrolithiasis Plan: hydration.  7. Fall- recurrent.  Likely from undocumented hypotension at the time with condition of GI bleed. Plan: fall precaution.  8. Dysnoea secondary to acute anemia. Plan: 2 D ECHO ? Dilated CMP.  9. Hx Hypertension Plan: all meds on hold for now.  10. Hx of chronic heart failure on Lasix therapy. Plan; all meds on hold. F/U 2 D ECHO  11. Hx Dyslipidemia Plan: check lipid profile  Lines: PIV Drips: Protonix, IVF, Banana bag Prophylaxis: Protonix, SCD Nutrition: NPO except for ice chips for now] Code: Full  Family: brother updated by phone.  Thank you for allowing me the privilege to care for this patient.  I have dedicated a total of 60 minutes in critical care time minus all appropriate exclusions.  [place] Dragon, MD  08/16/2020, 8:37 PM

## 2020-08-16 NOTE — ED Notes (Signed)
Blood consent form signed.

## 2020-08-17 ENCOUNTER — Inpatient Hospital Stay (HOSPITAL_COMMUNITY): Payer: Medicare Other

## 2020-08-17 ENCOUNTER — Inpatient Hospital Stay (HOSPITAL_COMMUNITY): Payer: Medicare Other | Admitting: Anesthesiology

## 2020-08-17 ENCOUNTER — Encounter (HOSPITAL_COMMUNITY)
Admission: EM | Disposition: A | Discharge status: Rehabilitation Facility | Payer: Self-pay | Source: Home / Self Care | Attending: Internal Medicine

## 2020-08-17 DIAGNOSIS — K922 Gastrointestinal hemorrhage, unspecified: Secondary | ICD-10-CM | POA: Diagnosis not present

## 2020-08-17 DIAGNOSIS — R0602 Shortness of breath: Secondary | ICD-10-CM | POA: Diagnosis not present

## 2020-08-17 HISTORY — PX: ESOPHAGOGASTRODUODENOSCOPY (EGD) WITH PROPOFOL: SHX5813

## 2020-08-17 HISTORY — PX: BIOPSY: SHX5522

## 2020-08-17 LAB — GLUCOSE, CAPILLARY
Glucose-Capillary: 112 mg/dL — ABNORMAL HIGH (ref 70–99)
Glucose-Capillary: 149 mg/dL — ABNORMAL HIGH (ref 70–99)
Glucose-Capillary: 92 mg/dL (ref 70–99)
Glucose-Capillary: 96 mg/dL (ref 70–99)

## 2020-08-17 LAB — CBC
HCT: 19.6 % — ABNORMAL LOW (ref 39.0–52.0)
HCT: 21.2 % — ABNORMAL LOW (ref 39.0–52.0)
Hemoglobin: 7.1 g/dL — ABNORMAL LOW (ref 13.0–17.0)
Hemoglobin: 7.6 g/dL — ABNORMAL LOW (ref 13.0–17.0)
MCH: 36.6 pg — ABNORMAL HIGH (ref 26.0–34.0)
MCH: 36.5 pg — ABNORMAL HIGH (ref 26.0–34.0)
MCHC: 36.2 g/dL — ABNORMAL HIGH (ref 30.0–36.0)
MCHC: 35.8 g/dL (ref 30.0–36.0)
MCV: 101 fL — ABNORMAL HIGH (ref 80.0–100.0)
MCV: 101.9 fL — ABNORMAL HIGH (ref 80.0–100.0)
Platelets: 83 10*3/uL — ABNORMAL LOW (ref 150–400)
Platelets: 95 10*3/uL — ABNORMAL LOW (ref 150–400)
RBC: 1.94 MIL/uL — ABNORMAL LOW (ref 4.22–5.81)
RBC: 2.08 MIL/uL — ABNORMAL LOW (ref 4.22–5.81)
RDW: 19.9 % — ABNORMAL HIGH (ref 11.5–15.5)
RDW: 20.4 % — ABNORMAL HIGH (ref 11.5–15.5)
WBC: 4.5 10*3/uL (ref 4.0–10.5)
WBC: 6 10*3/uL (ref 4.0–10.5)
nRBC: 0.4 % — ABNORMAL HIGH (ref 0.0–0.2)
nRBC: 0.3 % — ABNORMAL HIGH (ref 0.0–0.2)

## 2020-08-17 LAB — PHOSPHORUS
Phosphorus: 1.7 mg/dL — ABNORMAL LOW (ref 2.5–4.6)
Phosphorus: 1.1 mg/dL — ABNORMAL LOW (ref 2.5–4.6)

## 2020-08-17 LAB — HEMOGLOBIN A1C
Hgb A1c MFr Bld: 5.4 % (ref 4.8–5.6)
Mean Plasma Glucose: 108.28 mg/dL

## 2020-08-17 LAB — BASIC METABOLIC PANEL
Anion gap: 13 (ref 5–15)
Anion gap: 10 (ref 5–15)
BUN: 16 mg/dL (ref 8–23)
BUN: 14 mg/dL (ref 8–23)
CO2: 24 mmol/L (ref 22–32)
CO2: 25 mmol/L (ref 22–32)
Calcium: 7.6 mg/dL — ABNORMAL LOW (ref 8.9–10.3)
Calcium: 7.9 mg/dL — ABNORMAL LOW (ref 8.9–10.3)
Chloride: 95 mmol/L — ABNORMAL LOW (ref 98–111)
Chloride: 98 mmol/L (ref 98–111)
Creatinine, Ser: 1.03 mg/dL (ref 0.61–1.24)
Creatinine, Ser: 0.9 mg/dL (ref 0.61–1.24)
GFR, Estimated: >60 mL/min (ref >60)
GFR, Estimated: >60 mL/min (ref >60)
Glucose, Bld: 99 mg/dL (ref 70–99)
Glucose, Bld: 129 mg/dL — ABNORMAL HIGH (ref 70–99)
Potassium: 3.1 mmol/L — ABNORMAL LOW (ref 3.5–5.1)
Potassium: 2.9 mmol/L — ABNORMAL LOW (ref 3.5–5.1)
Sodium: 132 mmol/L — ABNORMAL LOW (ref 135–145)
Sodium: 133 mmol/L — ABNORMAL LOW (ref 135–145)

## 2020-08-17 LAB — ECHOCARDIOGRAM COMPLETE
AR max vel: 3.38 cm2
AV Area VTI: 3.34 cm2
AV Area mean vel: 3.07 cm2
AV Mean grad: 3 mmHg
AV Peak grad: 6.2 mmHg
Ao pk vel: 1.24 m/s
Area-P 1/2: 5.5 cm2
Height: 66 in
S' Lateral: 4.4 cm
Weight: 2613.77 oz

## 2020-08-17 LAB — LIPID PANEL
Cholesterol: 94 mg/dL (ref 0–200)
HDL: 46 mg/dL (ref >40)
LDL Cholesterol: 38 mg/dL (ref 0–99)
Total CHOL/HDL Ratio: 2 RATIO
Triglycerides: 51 mg/dL (ref <150)
VLDL: 10 mg/dL (ref 0–40)

## 2020-08-17 LAB — HEMOGLOBIN AND HEMATOCRIT, BLOOD
HCT: 20.1 % — ABNORMAL LOW (ref 39.0–52.0)
Hemoglobin: 7.2 g/dL — ABNORMAL LOW (ref 13.0–17.0)

## 2020-08-17 LAB — MRSA PCR SCREENING: MRSA by PCR: NEGATIVE

## 2020-08-17 LAB — LACTIC ACID, PLASMA: Lactic Acid, Venous: 1 mmol/L (ref 0.5–1.9)

## 2020-08-17 LAB — MAGNESIUM
Magnesium: 1.3 mg/dL — ABNORMAL LOW (ref 1.7–2.4)
Magnesium: 3 mg/dL — ABNORMAL HIGH (ref 1.7–2.4)

## 2020-08-17 SURGERY — ESOPHAGOGASTRODUODENOSCOPY (EGD) WITH PROPOFOL
Anesthesia: Monitor Anesthesia Care

## 2020-08-17 MED ORDER — PHENYLEPHRINE 40 MCG/ML (10ML) SYRINGE FOR IV PUSH (FOR BLOOD PRESSURE SUPPORT)
PREFILLED_SYRINGE | INTRAVENOUS | Status: DC | PRN
  Administered 2020-08-17 (×3): 120 ug via INTRAVENOUS

## 2020-08-17 MED ORDER — PROPOFOL 500 MG/50ML IV EMUL
INTRAVENOUS | Status: DC | PRN
  Administered 2020-08-17: 75 ug/kg/min via INTRAVENOUS

## 2020-08-17 MED ORDER — SODIUM PHOSPHATES 45 MMOLE/15ML IV SOLN
30.0000 mmol | Freq: Once | INTRAVENOUS | Status: AC
  Administered 2020-08-17: 30 mmol via INTRAVENOUS
  Filled 2020-08-17: qty 10

## 2020-08-17 MED ORDER — MAGNESIUM SULFATE 4 GM/100ML IV SOLN
4.0000 g | Freq: Once | INTRAVENOUS | Status: AC
  Administered 2020-08-17: 4 g via INTRAVENOUS
  Filled 2020-08-17: qty 100

## 2020-08-17 MED ORDER — PHENYLEPHRINE HCL-NACL 10-0.9 MG/250ML-% IV SOLN
INTRAVENOUS | Status: DC | PRN
  Administered 2020-08-17: 50 ug/min via INTRAVENOUS

## 2020-08-17 MED ORDER — LORAZEPAM 2 MG/ML IJ SOLN
1.0000 mg | INTRAMUSCULAR | Status: DC | PRN
Start: 2020-08-17 — End: 2020-08-19
  Administered 2020-08-18: 2 mg via INTRAVENOUS
  Administered 2020-08-18 (×2): 1 mg via INTRAVENOUS
  Administered 2020-08-19 (×2): 2 mg via INTRAVENOUS
  Filled 2020-08-17 (×5): qty 1

## 2020-08-17 MED ORDER — POTASSIUM PHOSPHATES 15 MMOLE/5ML IV SOLN
30.0000 mmol | Freq: Once | INTRAVENOUS | Status: AC
  Administered 2020-08-17: 30 mmol via INTRAVENOUS
  Filled 2020-08-17: qty 10

## 2020-08-17 MED ORDER — PERFLUTREN LIPID MICROSPHERE
1.0000 mL | INTRAVENOUS | Status: AC | PRN
  Administered 2020-08-17: 2 mL via INTRAVENOUS
  Filled 2020-08-17: qty 10

## 2020-08-17 MED ORDER — CHLORDIAZEPOXIDE HCL 25 MG PO CAPS
25.0000 mg | ORAL_CAPSULE | Freq: Three times a day (TID) | ORAL | Status: DC
  Administered 2020-08-17 – 2020-08-18 (×3): 25 mg via ORAL
  Filled 2020-08-17 (×3): qty 1

## 2020-08-17 MED ORDER — LACTATED RINGERS IV SOLN
INTRAVENOUS | Status: AC | PRN
  Administered 2020-08-17: 1000 mL via INTRAVENOUS

## 2020-08-17 MED ORDER — FOLIC ACID 1 MG PO TABS
1.0000 mg | ORAL_TABLET | Freq: Every day | ORAL | Status: DC
  Administered 2020-08-17 – 2020-08-18 (×2): 1 mg via ORAL
  Filled 2020-08-17 (×2): qty 1

## 2020-08-17 MED ORDER — POTASSIUM CHLORIDE 20 MEQ PO PACK
60.0000 meq | PACK | Freq: Once | ORAL | Status: AC
  Administered 2020-08-17: 60 meq via ORAL
  Filled 2020-08-17: qty 3

## 2020-08-17 MED ORDER — POTASSIUM CHLORIDE 10 MEQ/100ML IV SOLN
10.0000 meq | INTRAVENOUS | Status: AC
  Administered 2020-08-17 (×2): 10 meq via INTRAVENOUS
  Filled 2020-08-17 (×2): qty 100

## 2020-08-17 MED ORDER — MAGNESIUM SULFATE 2 GM/50ML IV SOLN
2.0000 g | Freq: Once | INTRAVENOUS | Status: AC
  Administered 2020-08-17: 2 g via INTRAVENOUS
  Filled 2020-08-17: qty 50

## 2020-08-17 MED ORDER — THIAMINE HCL 100 MG PO TABS
100.0000 mg | ORAL_TABLET | Freq: Every day | ORAL | Status: DC
  Administered 2020-08-17 – 2020-08-18 (×2): 100 mg via ORAL
  Filled 2020-08-17 (×2): qty 1

## 2020-08-17 MED ORDER — SODIUM CHLORIDE 0.9 % IV SOLN: INTRAVENOUS | Status: DC

## 2020-08-17 MED ORDER — ADULT MULTIVITAMIN W/MINERALS CH
1.0000 | ORAL_TABLET | Freq: Every day | ORAL | Status: DC
  Administered 2020-08-17 – 2020-08-18 (×2): 1 via ORAL
  Filled 2020-08-17 (×2): qty 1

## 2020-08-17 MED ORDER — ONDANSETRON HCL 4 MG/2ML IJ SOLN
INTRAMUSCULAR | Status: DC | PRN
  Administered 2020-08-17: 4 mg via INTRAVENOUS

## 2020-08-17 SURGICAL SUPPLY — 14 items

## 2020-08-17 NOTE — Consult Note (Signed)
Consult Note for Fernando [name]  Reason for Consult: [name] bleed Referring Physician: CCM  Fernando [name] HPI: This is a 65 year old male with a PMH of CHF (EF 25-30%), HTN, ETOH abuse, and hyperlipidemia admitted for a [name] bleed.  The patient reports a 2 days history of melenic stools.  There was also an associated complaint of weakness and several falls.  In the ER he was noted to have an HGB of 5.3 g/dL and he was successfully resuscitated with fluids and blood.  He has a history of a [name] bleed in the past and his last evaluation was with Dr. Loletha Carrow on 01/31/2020.  An EGD was performed at that time for complaints of melena and findings of anemia.  Only a mild portal HTN gastropathy was noted and there was evidence of a nodular duodenum of unknown significance.  There was essentially no source of bleeding identified.  His last colonoscopy was with Dr. Henrene Pastor on 08/11/2017 with findings of a couple of adenomas.  In the ER his rectal examination was positive for melena.  Past Medical History:  Diagnosis Date  . Alcohol dependence ([place])   . Arthritis   . Cataract   . Chronic systolic (congestive) heart failure (HCC)    EF 25-30% in 10/2019  . Dyslipidemia   . Essential hypertension   . History of kidney stones   . Hyperlipidemia   . Hypertension   . Pneumonia   . Prosthetic shoulder infection, initial encounter [facility])     Past Surgical History:  Procedure Laterality Date  . BIOPSY  04/21/2020   Procedure: BIOPSY;  Surgeon: Rush Landmark Telford Nab., MD;  Location: [place];  Service: Gastroenterology;;  . COLONOSCOPY    . COLONOSCOPY     multiple  . ESOPHAGOGASTRODUODENOSCOPY (EGD) WITH PROPOFOL N/A 01/31/2020   Procedure: ESOPHAGOGASTRODUODENOSCOPY (EGD) WITH PROPOFOL;  Surgeon: Doran Stabler, MD;  Location: [place];  Service: Gastroenterology;  Laterality: N/A;  . ESOPHAGOGASTRODUODENOSCOPY (EGD) WITH PROPOFOL N/A 04/21/2020   Procedure: ESOPHAGOGASTRODUODENOSCOPY (EGD) WITH PROPOFOL;   Surgeon: Rush Landmark Telford Nab., MD;  Location: [place];  Service: Gastroenterology;  Laterality: N/A;  . EUS N/A 04/21/2020   Procedure: UPPER ENDOSCOPIC ULTRASOUND (EUS) LINEAR;  Surgeon: Irving Copas., MD;  Location: [place];  Service: Gastroenterology;  Laterality: N/A;  . HERNIA REPAIR  1990  . KNEE SURGERY  1975   left men. repair  . KNEE SURGERY  1975  . REVERSE SHOULDER ARTHROPLASTY Right 06/21/2018   Procedure: REVERSE SHOULDER ARTHROPLASTY;  Surgeon: Hiram Gash, MD;  Location: [facility];  Service: Orthopedics;  Laterality: Right;  . ROTATOR CUFF REPAIR     right  . ROTATOR CUFF REPAIR    . TONSILLECTOMY    . TOTAL SHOULDER REPLACEMENT  2020  . UMBILICAL HERNIA REPAIR      Family History  Problem Relation Age of Onset  . Heart failure Mother   . Lung cancer Father   . Colon cancer Neg Hx   . Esophageal cancer Neg Hx   . Stomach cancer Neg Hx   . Liver cancer Neg Hx   . Pancreatic cancer Neg Hx   . Rectal cancer Neg Hx   . Inflammatory bowel disease Neg Hx     Social History:  reports that he has been smoking. He has a 25.00 pack-year smoking history. He has never used smokeless tobacco. He reports previous alcohol use of about 14.0 standard drinks of alcohol per week. He reports previous drug use.  Drug: Marijuana.  Allergies:  Allergies  Allergen Reactions  . Atorvastatin     Other reaction(s): joints ache  . Doxycycline     Other reaction(s): rash/hive    Medications:  Scheduled: . chlordiazePOXIDE  10 mg Oral TID  . Chlorhexidine Gluconate Cloth  6 each Topical Q0600  . [START ON 08/20/2020] pantoprazole  40 mg Intravenous Q12H   Continuous: . sodium chloride Stopped (08/16/20 2338)  . cefTRIAXone (ROCEPHIN)  IV 2 g (08/17/20 0100)  . pantoprozole (PROTONIX) infusion 8 mg/hr (08/17/20 0100)  . banana bag IV 1000 mL 100 mL/hr at 08/17/20 0000    Results for orders placed or performed during the hospital encounter of 08/16/20 (from the  past 24 hour(s))  Type and screen [place]     Status: None (Preliminary result)   Collection Time: 08/16/20  5:13 PM  Result Value Ref Range   ABO/RH(D) O POS    Antibody Screen NEG    Sample Expiration 08/19/2020,2359    Unit Number [id number]    Blood Component Type RED CELLS,LR    Unit division 00    Status of Unit ISSUED    Transfusion Status OK TO TRANSFUSE    Crossmatch Result COMPATIBLE    Unit tag comment VERBAL ORDERS PER [facility]    Unit Number [id number]    Blood Component Type RED CELLS,LR    Unit division 00    Status of Unit ISSUED    Transfusion Status OK TO TRANSFUSE    Crossmatch Result      Compatible Performed at [facility], [address], [place], [place] [address]   Comprehensive metabolic panel     Status: Abnormal   Collection Time: 08/16/20  5:14 PM  Result Value Ref Range   Sodium 128 (L) 135 - 145 mmol/L   Potassium 3.9 3.5 - 5.1 mmol/L   Chloride 89 (L) 98 - 111 mmol/L   CO2 20 (L) 22 - 32 mmol/L   Glucose, Bld 158 (H) 70 - 99 mg/dL   BUN 18 8 - 23 mg/dL   Creatinine, Ser 1.39 (H) 0.61 - 1.24 mg/dL   Calcium 8.7 (L) 8.9 - 10.3 mg/dL   Total Protein 5.5 (L) 6.5 - 8.1 g/dL   Albumin 3.0 (L) 3.5 - 5.0 g/dL   AST 37 15 - 41 U/L   ALT 40 0 - 44 U/L   Alkaline Phosphatase 66 38 - 126 U/L   Total Bilirubin 2.3 (H) 0.3 - 1.2 mg/dL   GFR, Estimated 56 (L) >60 mL/min   Anion gap 19 (H) 5 - 15  CBC WITH DIFFERENTIAL     Status: Abnormal   Collection Time: 08/16/20  5:14 PM  Result Value Ref Range   WBC 6.3 4.0 - 10.5 K/uL   RBC 1.31 (L) 4.22 - 5.81 MIL/uL   Hemoglobin 5.3 (LL) 13.0 - 17.0 g/dL   HCT 15.4 (L) 39.0 - 52.0 %   MCV 117.6 (H) 80.0 - 100.0 fL   MCH 40.5 (H) 26.0 - 34.0 pg   MCHC 34.4 30.0 - 36.0 g/dL   RDW 15.1 11.5 - 15.5 %   Platelets 123 (L) 150 - 400 K/uL   nRBC 1.3 (H) 0.0 - 0.2 %   Neutrophils Relative % 78 %   Neutro Abs 4.9 1.7 - 7.7 K/uL   Lymphocytes Relative 9 %   Lymphs Abs 0.6  (L) 0.7 - 4.0 K/uL   Monocytes Relative 11 %  Monocytes Absolute 0.7 0.1 - 1.0 K/uL   Eosinophils Relative 0 %   Eosinophils Absolute 0.0 0.0 - 0.5 K/uL   Basophils Relative 0 %   Basophils Absolute 0.0 0.0 - 0.1 K/uL   Immature Granulocytes 2 %   Abs Immature Granulocytes 0.13 (H) 0.00 - 0.07 K/uL  Protime-INR     Status: Abnormal   Collection Time: 08/16/20  5:14 PM  Result Value Ref Range   Prothrombin Time 15.4 (H) 11.4 - 15.2 seconds   INR 1.2 0.8 - 1.2  Lactic acid, plasma     Status: Abnormal   Collection Time: 08/16/20  5:14 PM  Result Value Ref Range   Lactic Acid, Venous 6.1 (HH) 0.5 - 1.9 mmol/L  Resp Panel by RT-PCR (Flu A&B, Covid) Nasopharyngeal Swab     Status: None   Collection Time: 08/16/20  5:15 PM   Specimen: Nasopharyngeal Swab; Nasopharyngeal(NP) swabs in vial transport medium  Result Value Ref Range   SARS Coronavirus 2 by RT PCR NEGATIVE NEGATIVE   Influenza A by PCR NEGATIVE NEGATIVE   Influenza B by PCR NEGATIVE NEGATIVE  Ethanol     Status: None   Collection Time: 08/16/20  5:15 PM  Result Value Ref Range   Alcohol, Ethyl (B) <10 <10 mg/dL  POC occult blood, ED Provider will collect     Status: Abnormal   Collection Time: 08/16/20  5:25 PM  Result Value Ref Range   Fecal Occult Bld POSITIVE (A) NEGATIVE  Prepare RBC (crossmatch)     Status: None   Collection Time: 08/16/20  7:21 PM  Result Value Ref Range   Order Confirmation      ORDER PROCESSED BY BLOOD BANK Performed at [facility], [address], [place], [place] [address]   Glucose, capillary     Status: Abnormal   Collection Time: 08/16/20 11:20 PM  Result Value Ref Range   Glucose-Capillary 122 (H) 70 - 99 mg/dL  Hemoglobin A1c     Status: None   Collection Time: 08/17/20 12:33 AM  Result Value Ref Range   Hgb A1c MFr Bld 5.4 4.8 - 5.6 %   Mean Plasma Glucose 108.28 mg/dL     CT Head Wo Contrast  Result Date: 08/16/2020 CLINICAL DATA:  Weakness with fall, no loss of  consciousness. EXAM: CT HEAD WITHOUT CONTRAST TECHNIQUE: Contiguous axial images were obtained from the base of the skull through the vertex without intravenous contrast. COMPARISON:  January 26, 2020 FINDINGS: Brain: No evidence of acute large vascular territory infarction, hemorrhage, hydrocephalus, extra-axial collection or mass lesion/mass effect. Stable mild age related global parenchymal volume loss. Similar mild burden chronic small-vessel white matter ischemic change. 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 Vascular: No hyperdense vessel. Atherosclerotic calcifications of the internal carotid and vertebral arteries at the skull base. Skull: Stable round hyperdense 1.2 cm subcutaneous nodule posterior to the right occiput, likely sebaceous cyst. Negative for fracture or focal lesion. Sinuses/Orbits: Mucosal thickening of the right maxillary sinus is again visualized. Small left mastoid effusion. Other: Dental hardware. IMPRESSION: 1. No acute intracranial findings. 2. Stable mild age related global parenchymal volume loss and chronic small-vessel white matter ischemic change. 3. Small left mastoid effusion. Electronically Signed   By: Dahlia Bailiff MD   On: 08/16/2020 18:58   DG Chest Port 1 View  Result Date: 08/16/2020 CLINICAL DATA:  Fall, hypotension EXAM: PORTABLE CHEST 1 VIEW COMPARISON:  02/02/2020 FINDINGS: The heart size and mediastinal contours  are within normal limits. Redemonstrated coarse, fibrotic opacity at the bilateral lung bases. No acute appearing airspace opacity. Status post right shoulder reverse total arthroplasty. IMPRESSION: Redemonstrated coarse, fibrotic opacity at the bilateral lung bases. No acute appearing airspace opacity. Electronically Signed   By: Eddie Candle M.D.   On: 08/16/2020 18:26    ROS:  As stated above in the HPI otherwise negative.  Blood pressure (!) 82/48, pulse 100, temperature 97.8 F (36.6 C), temperature source Axillary, resp.  rate (!) 27, height 5\' 6"  (1.676 m), weight 74.1 kg, SpO2 93 %.    PE: Gen: NAD, Alert and Oriented HEENT:  [place]/AT, EOMI Neck: Supple, no LAD Lungs: CTA Bilaterally CV: RRR without M/G/R ABD: Soft, NTND, +BS Ext: No C/C/E  Assessment/Plan: 1) Melena. 2) Anemia. 3) History of ETOH abuse.   He is hemodynamically stable after his fluid and blood resuscitation.  An EGD will be performed for further evaluation.  Plan: 1) EGD now. Fernando [name] D 08/17/2020, 1:16 AM

## 2020-08-17 NOTE — Progress Notes (Addendum)
  Echocardiogram 2D Echocardiogram with contrast has been performed.  Fernando [name] F 08/17/2020, 3:23 PM

## 2020-08-17 NOTE — Anesthesia Preprocedure Evaluation (Addendum)
Anesthesia Evaluation  Patient identified by MRN, date of birth, ID band Patient awake    Reviewed: Allergy & Precautions, NPO status , Patient's Chart, lab work & pertinent test results  History of Anesthesia Complications Negative for: history of anesthetic complications  Airway Mallampati: III  TM Distance: >3 FB Neck ROM: Full    Dental  (+) Dental Advisory Given, Teeth Intact   Pulmonary pneumonia, Current Smoker and Patient abstained from smoking.,    Pulmonary exam normal breath sounds clear to auscultation       Cardiovascular hypertension, Pt. on home beta blockers and Pt. on medications +CHF  Normal cardiovascular exam Rhythm:Regular Rate:Normal  Study Highlights Perfusion study    The left ventricular ejection fraction is mildly decreased (45-54%).  Nuclear stress EF: 50%.  No T wave inversion was noted during stress.  There was no ST segment deviation noted during stress.  This is a low risk study.   No reversible ischemia. LVEF 50% with normal wall motion. This is a low risk study.    ECHO: 1. Cannot exclude apicoseptal mural thrombus. Left ventricular ejection fraction, by estimation, is 25 to 30%. The left ventricle has severely decreased function. The left ventricle demonstrates global hypokinesis. Left ventricular diastolic parameters are consistent with Grade I diastolic dysfunction (impaired relaxation). 2. Right ventricular systolic function is mildly reduced. The right ventricular size is moderately enlarged. Tricuspid regurgitation signal is inadequate for assessing PA pressure. 3. The mitral valve is normal in structure. No evidence of mitral valve regurgitation. 4. The aortic valve is normal in structure. Aortic valve regurgitation is not visualized. No aortic stenosis is present. 5. Aortic dilatation noted. There is mild dilatation of the aortic root, measuring 39 mm.    Neuro/Psych PSYCHIATRIC DISORDERS negative neurological ROS     GI/Hepatic negative GI ROS, (+)     substance abuse  alcohol use, Hepatitis -  Endo/Other  negative endocrine ROS  Renal/GU Renal InsufficiencyRenal disease     Musculoskeletal  (+) Arthritis ,   Abdominal   Peds  Hematology negative hematology ROS (+) anemia , Dyslipidemia   Anesthesia Other Findings melena, upper GI bleed  Reproductive/Obstetrics                            Anesthesia Physical  Anesthesia Plan  ASA: IV  Anesthesia Plan: MAC   Post-op Pain Management:    Induction: Intravenous  PONV Risk Score and Plan: 0 and Propofol infusion, Treatment may vary due to age or medical condition and Midazolam  Airway Management Planned: Nasal Cannula  Additional Equipment:   Intra-op Plan:   Post-operative Plan:   Informed Consent: I have reviewed the patients History and Physical, chart, labs and discussed the procedure including the risks, benefits and alternatives for the proposed anesthesia with the patient or authorized representative who has indicated his/her understanding and acceptance.     Dental advisory given  Plan Discussed with: CRNA  Anesthesia Plan Comments:        Anesthesia Quick Evaluation

## 2020-08-17 NOTE — Anesthesia Procedure Notes (Signed)
Procedure Name: MAC Date/Time: 08/17/2020 8:50 AM Performed by: Griffin Dakin, CRNA Pre-anesthesia Checklist: Patient identified, Emergency Drugs available, Suction available, Patient being monitored and Timeout performed Patient Re-evaluated:Patient Re-evaluated prior to induction Oxygen Delivery Method: Nasal cannula Induction Type: IV induction Airway Equipment and Method: Patient positioned with wedge pillow Placement Confirmation: positive ETCO2 and breath sounds checked- equal and bilateral Dental Injury: Teeth and Oropharynx as per pre-operative assessment

## 2020-08-17 NOTE — Progress Notes (Signed)
K 3.1, Phos 1.7, Mg 1.3 Electrolytes replaced per [facility] electrolyte replacement protocol

## 2020-08-17 NOTE — Transfer of Care (Signed)
Immediate Anesthesia Transfer of Care Note  Patient: Fernando [name]  Procedure(s) Performed: ESOPHAGOGASTRODUODENOSCOPY (EGD) WITH PROPOFOL (N/A ) BIOPSY  Patient Location: PACU  Anesthesia Type:MAC  Level of Consciousness: awake, alert  and oriented  Airway & Oxygen Therapy: Patient Spontanous Breathing and Patient connected to nasal cannula oxygen  Post-op Assessment: Report given to RN and Post -op Vital signs reviewed and stable  Post vital signs: Reviewed and stable  Last Vitals:  Vitals Value Taken Time  BP    Temp    Pulse    Resp    SpO2      Last Pain:  Vitals:   08/17/20 0822  TempSrc: Oral  PainSc: 0-No pain      Patients Stated Pain Goal: 0 (38/45/36 4680)  Complications: No complications documented.

## 2020-08-17 NOTE — Anesthesia Postprocedure Evaluation (Signed)
Anesthesia Post Note  Patient: Fernando [name]  Procedure(s) Performed: ESOPHAGOGASTRODUODENOSCOPY (EGD) WITH PROPOFOL (N/A ) BIOPSY     Patient location during evaluation: PACU Anesthesia Type: MAC Level of consciousness: awake and alert Pain management: pain level controlled Vital Signs Assessment: post-procedure vital signs reviewed and stable Respiratory status: spontaneous breathing Cardiovascular status: stable Anesthetic complications: no   No complications documented.  Last Vitals:  Vitals:   08/17/20 0918 08/17/20 0931  BP: (!) 87/52 (!) 88/60  Pulse: 94 96  Resp: 18 (!) 22  Temp:    SpO2: 93% 92%    Last Pain:  Vitals:   08/17/20 0931  TempSrc:   PainSc: 0-No pain                 Nolon Nations

## 2020-08-17 NOTE — Progress Notes (Signed)
NAME:  Fernando [name], MRN:  [id number], DOB:  September 21, 1955, LOS: 1 ADMISSION DATE:  08/16/2020, CONSULTATION DATE:  5/14 REFERRING MD:  Aris Lot CHIEF COMPLAINT:  Generalized weakness  History of Present Illness:  Mr. Fernando [name] is a 65 year old gentleman with PMH as outlined below. He presented to the ED with compliant of generalized weakness and fatigue. He reported that he has fallen 3 times last week and once today prior to presentation. He denied head trauma. He reported exertional dysnoea but no chest pain. Patient denied taking blood thinners however he reported black stools for 2 days.  Patient admitted to drinking 1/2 a bottle of bourbon daily.  Patient was found to be hypotensive upon arrival to the ED. He received 2 liters of NS bolus. His hemoglobin came back to be 5.3 and his occult blood was positive. He was given uncrossed matched blood because of refractory hypotension. Hence patient was referred to [facility] team for admission.  Pertinent  Medical History  Alcohol dependence CHF, EF 25-30% Hypertension  Significant Hospital Events: Including procedures, antibiotic start and stop dates in addition to other pertinent events   . 5/14 admitted to ICU . 5/15 EGD - Non-bleeding gastric ulcers, portal hypertensive gastropathy, duodenitis  Interim History / Subjective:   Patient requesting food, otherwise without complaints.   He had EGD this morning, no acute bleeding lesions noted. Able to resume regular diet.  Objective   Blood pressure (!) 88/46, pulse 98, temperature 98.4 F (36.9 C), temperature source Temporal, resp. rate 20, height 5\' 6"  (1.676 m), weight 74.1 kg, SpO2 92 %.        Intake/Output Summary (Last 24 hours) at 08/17/2020 1045 Last data filed at 08/17/2020 1000 Gross per 24 hour  Intake 4808.71 ml  Output 1475 ml  Net 3333.71 ml   Filed Weights   08/16/20 2308 08/17/20 0400 08/17/20 0822  Weight: 74.1 kg 74.1 kg 74.1 kg     Examination: General: elderly male, no acute distress HENT: [place]/AT, moist mucous membranes, sclera anicteric Lungs: crackles at bilateral bases. No wheezing. Cardiovascular: rrr, no murmurs Abdomen: soft, non-tender, non-distended, BS+ Extremities: warm, no edema Neuro: alert and oriented, moving all extremities GU: deferred  Labs/imaging that I have personally reviewed  (right click and "Reselect all SmartList Selections" daily)  5/14 CXR - interstitial prominence at bases 05/01/20 CT Abdomen - emphysematous changes in bilateral lung bases. Increased interstitial markings.    5/15 - BMP and CBC  Resolved Hospital Problem list     Assessment & Plan:  Hemorrhagic Shock Acute Upper GI Bleed - portal gastropathy, gastric ulcers, duodenitis Acute Blood Loss Anemia - s/p EGD with no acute intervention required - Cont PPI gtt - transfused 2 units PRBCs so far - Continue to monitor H/H every 12 hours  Alcohol Dependence He has history of alcohol withdrawal seizures - continue librium, increase dose to 25mg  TID from 10mg  - CIWA protocol - Folate, thiamine  Acute Kidney Injury - Improved, cont to monitor renal function  Hx of Heart Failure Hx of Hypertension - hold medications at this time  Electrolyte derangements - repleted by [facility] - repeat BMP, Mg, Phos this afternoon  Best practice (right click and "Reselect all SmartList Selections" daily)  Diet:  Oral Pain/Anxiety/Delirium protocol (if indicated): No VAP protocol (if indicated): Not indicated DVT prophylaxis: Contraindicated GI prophylaxis: PPI Glucose control:  SSI No Central venous access:  N/A Arterial line:  N/A Foley:  N/A Mobility:  OOB  PT consulted: Yes Last date of multidisciplinary goals of care discussion n/a Code Status:  full code Disposition: ICU  Labs   CBC: Recent Labs  Lab 08/16/20 1714 08/17/20 0033 08/17/20 0350  WBC 6.3  --  4.5  NEUTROABS 4.9  --   --   HGB 5.3* 7.2* 7.1*   HCT 15.4* 20.1* 19.6*  MCV 117.6*  --  101.0*  PLT 123*  --  83*    Basic Metabolic Panel: Recent Labs  Lab 08/16/20 1714 08/17/20 0350  NA 128* 132*  K 3.9 3.1*  CL 89* 95*  CO2 20* 24  GLUCOSE 158* 99  BUN 18 16  CREATININE 1.39* 1.03  CALCIUM 8.7* 7.6*  MG  --  1.3*  PHOS  --  1.7*   GFR: Estimated Creatinine Clearance: 62.8 mL/min (by C-G formula based on SCr of 1.03 mg/dL). Recent Labs  Lab 08/16/20 1714 08/17/20 0033 08/17/20 0350  WBC 6.3  --  4.5  LATICACIDVEN 6.1* 1.0  --     Liver Function Tests: Recent Labs  Lab 08/16/20 1714  AST 37  ALT 40  ALKPHOS 66  BILITOT 2.3*  PROT 5.5*  ALBUMIN 3.0*   No results for input(s): LIPASE, AMYLASE in the last 168 hours. No results for input(s): AMMONIA in the last 168 hours.  ABG    Component Value Date/Time   PHART 7.469 (H) 07/30/2008 0837   PCO2ART 45.6 (H) 07/30/2008 0837   PO2ART 74.0 (L) 07/30/2008 0837   HCO3 33.1 (H) 07/30/2008 0837   TCO2 19 (L) 01/26/2020 0227   ACIDBASEDEF 1.0 07/21/2008 0534   O2SAT 95.0 07/30/2008 0837     Coagulation Profile: Recent Labs  Lab 08/16/20 1714  INR 1.2    Cardiac Enzymes: No results for input(s): CKTOTAL, CKMB, CKMBINDEX, TROPONINI in the last 168 hours.  HbA1C: Hgb A1c MFr Bld  Date/Time Value Ref Range Status  08/17/2020 12:33 AM 5.4 4.8 - 5.6 % Final    Comment:    (NOTE) Pre diabetes:          5.7%-6.4%  Diabetes:              >6.4%  Glycemic control for   <7.0% adults with diabetes     CBG: Recent Labs  Lab 08/16/20 2320 08/17/20 0545 08/17/20 0808  GLUCAP 122* 96 92       Critical care time: 43 minutes    Freda Jackson, MD [facility] Office: [phone]   See Amion for personal pager PCCM on call pager [phone] until 7pm. Please call [place] 7p-7a. [phone]

## 2020-08-17 NOTE — Op Note (Signed)
[facility] Patient Name: Fernando [name] Procedure Date : 08/17/2020 MRN: [id number] Attending MD: Carol Ada , MD Date of Birth: Jun 02, 1955 CSN: [id number] Age: 65 Admit Type: Inpatient Procedure:                Upper GI endoscopy Indications:              Melena Providers:                Carol Ada, MD, Glori Bickers, RN, Laverda Sorenson,                            Technician, Viann Fish, CRNA Referring MD:              Medicines:                Propofol per Anesthesia Complications:            No immediate complications. Estimated Blood Loss:     Estimated blood loss was minimal. Procedure:                Pre-Anesthesia Assessment:                           - Prior to the procedure, a History and Physical                            was performed, and patient medications and                            allergies were reviewed. The patient's tolerance of                            previous anesthesia was also reviewed. The risks                            and benefits of the procedure and the sedation                            options and risks were discussed with the patient.                            All questions were answered, and informed consent                            was obtained. Prior Anticoagulants: The patient has                            taken no previous anticoagulant or antiplatelet                            agents. ASA Grade Assessment: III - A patient with                            severe systemic disease. After reviewing the risks  and benefits, the patient was deemed in                            satisfactory condition to undergo the procedure.                           - Sedation was administered by an anesthesia                            professional. Deep sedation was attained.                           After obtaining informed consent, the endoscope was                            passed under direct vision.  Throughout the                            procedure, the patient's blood pressure, pulse, and                            oxygen saturations were monitored continuously. The                            GIF-H190 ([id number] Olympus gastroscope was                            introduced through the mouth, and advanced to the                            second part of duodenum. The upper GI endoscopy was                            accomplished without difficulty. The patient                            tolerated the procedure well. Scope In: Scope Out: Findings:      One benign-appearing, intrinsic mild stenosis was found in the upper       third of the esophagus. This stenosis measured 9 mm (inner diameter) x 2       cm (in length). The stenosis was traversed.      Few non-bleeding cratered and superficial gastric ulcers with a clean       ulcer base (Forrest Class III) were found on the lesser curvature of the       stomach and in the gastric antrum. The largest lesion was 3 mm in       largest dimension. Biopsies were taken with a cold forceps for       Helicobacter pylori testing.      Mild portal hypertensive gastropathy was found in the gastric fundus and       in the gastric body.      Diffuse mild inflammation characterized by erythema was found in the       duodenal bulb.      Few non-bleeding superficial duodenal ulcers with a clean ulcer base       (  Forrest Class III) were found in the duodenal bulb. The largest lesion       was 3 mm in largest dimension.      Superficial ulcerations were found in the antrum and the duodenal bulb.       There was no evidence of any active bleeding. Biopsies were obtained for       H. pylori. There was no evidence of esophageal varices. An incidental       finding of a stricture at the UES/first third of the esophagus was       identified. Dilation was performed with passage of the endoscope. Impression:               - Benign-appearing esophageal  stenosis.                           - Non-bleeding gastric ulcers with a clean ulcer                            base (Forrest Class III). Biopsied.                           - Portal hypertensive gastropathy.                           - Duodenitis.                           - Non-bleeding duodenal ulcers with a clean ulcer                            base (Forrest Class III). Recommendation:           - Return patient to hospital ward for ongoing care.                           - Resume regular diet.                           - Continue present medications.                           - Await pathology results.                           - Monitor HGB and transfuse as necessary.                           - If bleeding continues he will need a colonoscopy.                           - [facility] to assume care in the AM. Procedure Code(s):        --- Professional ---                           [phone], Esophagogastroduodenoscopy, flexible,                            transoral; with  biopsy, single or multiple Diagnosis Code(s):        --- Professional ---                           K25.9, Gastric ulcer, unspecified as acute or                            chronic, without hemorrhage or perforation                           K22.2, Esophageal obstruction                           K76.6, Portal hypertension                           K31.89, Other diseases of stomach and duodenum                           K29.80, Duodenitis without bleeding                           K26.9, Duodenal ulcer, unspecified as acute or                            chronic, without hemorrhage or perforation                           K92.1, Melena (includes Hematochezia) CPT copyright 2019 [employer]. All rights reserved. The codes documented in this report are preliminary and upon coder review may  be revised to meet current compliance requirements. Carol Ada, MD Carol Ada, MD 08/17/2020 9:12:41 AM This  report has been signed electronically. Number of Addenda: 0

## 2020-08-18 ENCOUNTER — Inpatient Hospital Stay (HOSPITAL_COMMUNITY): Payer: Medicare Other

## 2020-08-18 ENCOUNTER — Encounter (HOSPITAL_COMMUNITY): Payer: Self-pay | Admitting: Gastroenterology

## 2020-08-18 DIAGNOSIS — K264 Chronic or unspecified duodenal ulcer with hemorrhage: Secondary | ICD-10-CM | POA: Diagnosis not present

## 2020-08-18 DIAGNOSIS — K7030 Alcoholic cirrhosis of liver without ascites: Secondary | ICD-10-CM

## 2020-08-18 DIAGNOSIS — K862 Cyst of pancreas: Secondary | ICD-10-CM

## 2020-08-18 DIAGNOSIS — K922 Gastrointestinal hemorrhage, unspecified: Secondary | ICD-10-CM | POA: Diagnosis not present

## 2020-08-18 LAB — BASIC METABOLIC PANEL
Anion gap: 7 (ref 5–15)
Anion gap: 8 (ref 5–15)
BUN: 13 mg/dL (ref 8–23)
BUN: 8 mg/dL (ref 8–23)
CO2: 26 mmol/L (ref 22–32)
CO2: 27 mmol/L (ref 22–32)
Calcium: 7.7 mg/dL — ABNORMAL LOW (ref 8.9–10.3)
Calcium: 7.9 mg/dL — ABNORMAL LOW (ref 8.9–10.3)
Chloride: 98 mmol/L (ref 98–111)
Chloride: 96 mmol/L — ABNORMAL LOW (ref 98–111)
Creatinine, Ser: 0.93 mg/dL (ref 0.61–1.24)
Creatinine, Ser: 0.78 mg/dL (ref 0.61–1.24)
GFR, Estimated: >60 mL/min (ref >60)
GFR, Estimated: >60 mL/min (ref >60)
Glucose, Bld: 139 mg/dL — ABNORMAL HIGH (ref 70–99)
Glucose, Bld: 132 mg/dL — ABNORMAL HIGH (ref 70–99)
Potassium: 3.6 mmol/L (ref 3.5–5.1)
Potassium: 3.4 mmol/L — ABNORMAL LOW (ref 3.5–5.1)
Sodium: 131 mmol/L — ABNORMAL LOW (ref 135–145)
Sodium: 131 mmol/L — ABNORMAL LOW (ref 135–145)

## 2020-08-18 LAB — CBC
HCT: 19.3 % — ABNORMAL LOW (ref 39.0–52.0)
HCT: 25.8 % — ABNORMAL LOW (ref 39.0–52.0)
Hemoglobin: 6.8 g/dL — CL (ref 13.0–17.0)
Hemoglobin: 9.1 g/dL — ABNORMAL LOW (ref 13.0–17.0)
MCH: 36.4 pg — ABNORMAL HIGH (ref 26.0–34.0)
MCH: 35 pg — ABNORMAL HIGH (ref 26.0–34.0)
MCHC: 35.2 g/dL (ref 30.0–36.0)
MCHC: 35.3 g/dL (ref 30.0–36.0)
MCV: 103.2 fL — ABNORMAL HIGH (ref 80.0–100.0)
MCV: 99.2 fL (ref 80.0–100.0)
Platelets: 79 10*3/uL — ABNORMAL LOW (ref 150–400)
Platelets: 95 10*3/uL — ABNORMAL LOW (ref 150–400)
RBC: 1.87 MIL/uL — ABNORMAL LOW (ref 4.22–5.81)
RBC: 2.6 MIL/uL — ABNORMAL LOW (ref 4.22–5.81)
RDW: 20.3 % — ABNORMAL HIGH (ref 11.5–15.5)
RDW: 20.8 % — ABNORMAL HIGH (ref 11.5–15.5)
WBC: 5.6 10*3/uL (ref 4.0–10.5)
WBC: 6.4 10*3/uL (ref 4.0–10.5)
nRBC: 0.5 % — ABNORMAL HIGH (ref 0.0–0.2)
nRBC: 0.5 % — ABNORMAL HIGH (ref 0.0–0.2)

## 2020-08-18 LAB — GLUCOSE, CAPILLARY
Glucose-Capillary: 111 mg/dL — ABNORMAL HIGH (ref 70–99)
Glucose-Capillary: 122 mg/dL — ABNORMAL HIGH (ref 70–99)
Glucose-Capillary: 123 mg/dL — ABNORMAL HIGH (ref 70–99)
Glucose-Capillary: 129 mg/dL — ABNORMAL HIGH (ref 70–99)

## 2020-08-18 LAB — MAGNESIUM
Magnesium: 2.8 mg/dL — ABNORMAL HIGH (ref 1.7–2.4)
Magnesium: 2.3 mg/dL (ref 1.7–2.4)
Magnesium: 2.1 mg/dL (ref 1.7–2.4)

## 2020-08-18 LAB — HEMOGLOBIN AND HEMATOCRIT, BLOOD
HCT: 24.2 % — ABNORMAL LOW (ref 39.0–52.0)
Hemoglobin: 8.6 g/dL — ABNORMAL LOW (ref 13.0–17.0)

## 2020-08-18 LAB — PHOSPHORUS: Phosphorus: 3.4 mg/dL (ref 2.5–4.6)

## 2020-08-18 LAB — PREPARE RBC (CROSSMATCH)

## 2020-08-18 MED ORDER — LACTATED RINGERS IV BOLUS
500.0000 mL | Freq: Once | INTRAVENOUS | Status: AC
  Administered 2020-08-18: 500 mL via INTRAVENOUS

## 2020-08-18 MED ORDER — POTASSIUM CHLORIDE CRYS ER 20 MEQ PO TBCR
40.0000 meq | EXTENDED_RELEASE_TABLET | Freq: Once | ORAL | Status: AC
  Administered 2020-08-18: 40 meq via ORAL
  Filled 2020-08-18: qty 2

## 2020-08-18 MED ORDER — PANTOPRAZOLE SODIUM 40 MG IV SOLR
40.0000 mg | Freq: Two times a day (BID) | INTRAVENOUS | Status: DC
  Administered 2020-08-18: 40 mg via INTRAVENOUS
  Filled 2020-08-18: qty 40

## 2020-08-18 MED ORDER — ALBUTEROL SULFATE (2.5 MG/3ML) 0.083% IN NEBU
2.5000 mg | INHALATION_SOLUTION | Freq: Four times a day (QID) | RESPIRATORY_TRACT | Status: AC | PRN
  Administered 2020-08-18 – 2020-08-22 (×2): 2.5 mg via RESPIRATORY_TRACT
  Filled 2020-08-18 (×2): qty 3

## 2020-08-18 MED ORDER — ALBUMIN HUMAN 25 % IV SOLN: 25.0000 g | Freq: Four times a day (QID) | INTRAVENOUS | Status: DC

## 2020-08-18 MED ORDER — ORAL CARE MOUTH RINSE
15.0000 mL | Freq: Two times a day (BID) | OROMUCOSAL | Status: DC
  Administered 2020-08-18 – 2020-08-29 (×22): 15 mL via OROMUCOSAL

## 2020-08-18 MED ORDER — POTASSIUM CHLORIDE CRYS ER 20 MEQ PO TBCR
20.0000 meq | EXTENDED_RELEASE_TABLET | Freq: Once | ORAL | Status: AC
  Administered 2020-08-18: 20 meq via ORAL
  Filled 2020-08-18: qty 1

## 2020-08-18 MED ORDER — ALBUMIN HUMAN 25 % IV SOLN
25.0000 g | Freq: Four times a day (QID) | INTRAVENOUS | Status: DC
  Administered 2020-08-18 (×2): 25 g via INTRAVENOUS
  Filled 2020-08-18 (×2): qty 100

## 2020-08-18 MED ORDER — SODIUM CHLORIDE 0.9% IV SOLUTION: Freq: Once | INTRAVENOUS | Status: DC

## 2020-08-18 MED ORDER — CHLORDIAZEPOXIDE HCL 5 MG PO CAPS
10.0000 mg | ORAL_CAPSULE | Freq: Three times a day (TID) | ORAL | Status: DC
  Administered 2020-08-18 (×2): 10 mg via ORAL
  Filled 2020-08-18 (×2): qty 2

## 2020-08-18 MED ORDER — SODIUM CHLORIDE 0.9 % IV BOLUS
500.0000 mL | Freq: Once | INTRAVENOUS | Status: AC
  Administered 2020-08-18: 500 mL via INTRAVENOUS

## 2020-08-18 NOTE — Progress Notes (Signed)
eLink Physician-Brief Progress Note Patient Name: Fernando [name] DOB: 1956/03/05 MRN: [id number]   Date of Service  08/18/2020  HPI/Events of Note  Patient with a 25 pack year history of tobacco abuse, he is wheezing. He also has cardiomyopathy with an EF of 25 %.  eICU Interventions  Will give him an Albuterol breathing treatment and order a BNP and stat portable CXR to r/o pulmonary edema causing " cardiac wheezing", given that he received multiple PRBC units in the past 24 hours.        Kerry Kass Nevah Dalal 08/18/2020, 11:06 PM

## 2020-08-18 NOTE — Evaluation (Signed)
Physical Therapy Evaluation Patient Details Name: Fernando [name] MRN: [id number] DOB: 1955-09-12 Today's Date: 08/18/2020   History of Present Illness  Pt presented 5/14 with weakness, fatigue, and falls. Pt found to have Hgb 5.3 and transfused. Pt with acute upper GI bleed. PMH - etoh abuse, chf, HTN, dislocated lt shoulder, rt reverse total shoulder replacement.  Clinical Impression  Pt presents to PT with dependencies in all mobility due to weakness, poor balance, and soft BP. Pt lives alone and only has a neighbor to check on him intermittently. At this time recommend SNF but pt may either refuse this option or he may progress rapidly and not need this option. In either case would then maximize home services. Pt has a long history of falling at home and this is likely increased due to his etoh use. Did not attempt ambulation today due to soft BP.   89/50 supine 91/61 sitting EOB 94/78 sitting EOB p 3 minutes 81/57 sitting in chair 88/61 sitting in chair p 5 minutes       Follow Up Recommendations SNF (Pt may refuse this and/or progress beyond this.)    Equipment Recommendations  Other (comment)    Recommendations for Other Services       Precautions / Restrictions Precautions Precautions: Fall;Other (comment) Precaution Comments: watch BP      Mobility  Bed Mobility Overal bed mobility: Needs Assistance Bed Mobility: Supine to Sit     Supine to sit: Min assist;HOB elevated     General bed mobility comments: assist to elevate trunk into sitting    Transfers Overall transfer level: Needs assistance Equipment used: Rolling walker (2 wheeled) Transfers: Sit to/from [facility] Sit to Stand: Min assist Stand pivot transfers: Min assist       General transfer comment: Assist to bring hips up and for balance. Bed to chair with pivotal shuffling steps and use of walker. Assist for balance.  Ambulation/Gait             General Gait Details:  Not attempted due to soft BP  Stairs            Wheelchair Mobility    Modified Rankin (Stroke Patients Only)       Balance Overall balance assessment: Needs assistance Sitting-balance support: Feet supported;Bilateral upper extremity supported Sitting balance-Leahy Scale: Poor Sitting balance - Comments: UE support   Standing balance support: Bilateral upper extremity supported Standing balance-Leahy Scale: Poor Standing balance comment: walker and min assist for static standing                             Pertinent Vitals/Pain Pain Assessment: No/denies pain    Home Living Family/patient expects to be discharged to:: Private residence Living Arrangements: Alone Available Help at Discharge: Neighbor;Available PRN/intermittently Type of Home: House Home Access: Stairs to enter   Entrance Stairs-Number of Steps: 1 Home Layout: One level Home Equipment: Walker - 2 wheels;Cane - single point      Prior Function Level of Independence: Independent         Comments: Retired, used to work building houses     Journalist, newspaper   Dominant Hand: Right    Extremity/Trunk Assessment   Upper Extremity Assessment Upper Extremity Assessment: Defer to OT evaluation    Lower Extremity Assessment Lower Extremity Assessment: Generalized weakness       Communication   Communication: No difficulties  Cognition Arousal/Alertness: Awake/alert Behavior During Therapy: [facility] for  tasks assessed/performed Overall Cognitive Status: Impaired/Different from baseline Area of Impairment: Problem solving;Safety/judgement                         Safety/Judgement: Decreased awareness of safety;Decreased awareness of deficits   Problem Solving: Slow processing        General Comments General comments (skin integrity, edema, etc.): Pt 98% on 2L of O2. Removed O2 with pt dropping to 87% on RA sitting EOB after several minuetes. Replaced O2. See assessment  for BP.    Exercises     Assessment/Plan    PT Assessment Patient needs continued PT services  PT Problem List Decreased strength;Decreased activity tolerance;Decreased balance;Decreased mobility;Decreased cognition;Decreased safety awareness;Cardiopulmonary status limiting activity       PT Treatment Interventions DME instruction;Gait training;Functional mobility training;Therapeutic activities;Therapeutic exercise;Balance training;Patient/family education;Cognitive remediation    PT Goals (Current goals can be found in the Care Plan section)  Acute Rehab PT Goals Patient Stated Goal: return home PT Goal Formulation: With patient Time For Goal Achievement: 09/01/20 Potential to Achieve Goals: Good    Frequency Min 3X/week   Barriers to discharge Decreased caregiver support lives alone    Co-evaluation               AM-PAC PT "6 Clicks" Mobility  Outcome Measure Help needed turning from your back to your side while in a flat bed without using bedrails?: A Little Help needed moving from lying on your back to sitting on the side of a flat bed without using bedrails?: A Little Help needed moving to and from a bed to a chair (including a wheelchair)?: A Little Help needed standing up from a chair using your arms (e.g., wheelchair or bedside chair)?: A Little Help needed to walk in hospital room?: Total Help needed climbing 3-5 steps with a railing? : Total 6 Click Score: 14    End of Session Equipment Utilized During Treatment: Gait belt;Oxygen Activity Tolerance: Patient limited by fatigue;Treatment limited secondary to medical complications (Comment) (low BP) Patient left: in chair;with call bell/phone within reach;with chair alarm set Nurse Communication: Mobility status PT Visit Diagnosis: Unsteadiness on feet (R26.81);Other abnormalities of gait and mobility (R26.89);Muscle weakness (generalized) (M62.81);History of falling (Z91.81)    Time: 9326-7124 PT Time  Calculation (min) (ACUTE ONLY): 26 min   Charges:   PT Evaluation $PT Eval Moderate Complexity: 1 Mod PT Treatments $Therapeutic Activity: 8-22 mins        S[place]y Pager [phone] Office [place] 08/18/2020, 12:08 PM

## 2020-08-18 NOTE — Progress Notes (Signed)
eLink Physician-Brief Progress Note Patient Name: [name] DOB: 02/09/1956 MRN: [id number]   Date of Service  08/18/2020  HPI/Events of Note  RN called about hypotension 66/47. I asked him to send am labs now. Pt has PIV. Echo done yesterday shows EF 55-60%  Discussed with RN. HR 103. 66/48  No bleeding.s/p 2 PRBC. EGD: no bleeding. Duodenits. Class 3 gastric ulcer.   On 3 lit.  eICU Interventions  LR 500 ml bolus     Intervention Category Intermediate Interventions: Hypotension - evaluation and management  Elmer Sow 08/18/2020, 2:48 AM

## 2020-08-18 NOTE — Progress Notes (Addendum)
eLink Physician-Brief Progress Note Patient Name: Fernando [name] DOB: [birth year] MRN: [id number]   Date of Service  08/18/2020  HPI/Events of Note  Hg back at 6.8 No active bleeding, but Hg slowly dropping. No varices. plt stable. Low.   eICU Interventions  PRBC ordered. if active bleeding to call us and GI on call. watch for hypotension and tachycardia     Intervention Category Intermediate Interventions: Other:;Diagnostic test evaluation  Elmer Sow 08/18/2020, 4:01 AM

## 2020-08-18 NOTE — Evaluation (Signed)
Occupational Therapy Evaluation Patient Details Name: Fernando [name] MRN: [id number] DOB: May 09, 1955 Today's Date: 08/18/2020    History of Present Illness Pt presented 5/14 with weakness, fatigue, and falls. Pt found to have Hgb 5.3 and transfused. Pt with acute upper GI bleed. PMH - etoh abuse, chf, HTN, dislocated lt shoulder, rt reverse total shoulder replacement.   Clinical Impression   Pt was independent, including driving prior to admission. Presents with generalized weakness, decreased balance and impaired cognition. Pt requires min assist for transfers with RW and set up to max assist for ADL. Did not ambulate due to hypotension 88/67.  Pt lives alone and has a Industrial/product designer for intermittent support. Recommending SNF for continued rehab upon discharge, but pt may refuse.    Follow Up Recommendations  SNF;Supervision/Assistance - 24 hour    Equipment Recommendations  None recommended by OT    Recommendations for Other Services       Precautions / Restrictions Precautions Precautions: Fall Precaution Comments: watch BP      Mobility Bed Mobility      General bed mobility comments: pt in chair    Transfers Overall transfer level: Needs assistance Equipment used: Rolling walker (2 wheeled) Transfers: Sit to/from [facility] Sit to Stand: Min assist Stand pivot transfers: Min assist       General transfer comment: assist to rise and steady, limited mobility due to soft BP    Balance Overall balance assessment: Needs assistance Sitting-balance support: Feet supported;Bilateral upper extremity supported Sitting balance-Leahy Scale: Fair Sitting balance - Comments: UE support   Standing balance support: Bilateral upper extremity supported Standing balance-Leahy Scale: Poor Standing balance comment: walker and min assist for static standing                           ADL either performed or assessed with clinical judgement   ADL Overall  ADL's : Needs assistance/impaired Eating/Feeding: Independent;Sitting   Grooming: Wash/dry hands;Sitting;Set up   Upper Body Bathing: Moderate assistance;Sitting   Lower Body Bathing: Maximal assistance;Sit to/from stand   Upper Body Dressing : Minimal assistance;Sitting   Lower Body Dressing: Maximal assistance;Sit to/from stand   Toilet Transfer: Minimal assistance;Stand-pivot;BSC;RW   Toileting- Clothing Manipulation and Hygiene: Minimal assistance;Sit to/from stand               Vision Baseline Vision/History: Wears glasses Patient Visual Report: No change from baseline       Perception     Praxis      Pertinent Vitals/Pain Pain Assessment: No/denies pain     Hand Dominance Right   Extremity/Trunk Assessment Upper Extremity Assessment Upper Extremity Assessment: LUE deficits/detail LUE Deficits / Details: 3+/5 shoulder, longstanding LUE Coordination: decreased gross motor   Lower Extremity Assessment Lower Extremity Assessment: Defer to PT evaluation       Communication Communication Communication: No difficulties   Cognition Arousal/Alertness: Awake/alert Behavior During Therapy: Flat affect Overall Cognitive Status: Impaired/Different from baseline Area of Impairment: Problem solving;Safety/judgement                         Safety/Judgement: Decreased awareness of safety;Decreased awareness of deficits   Problem Solving: Slow processing     General Comments      Exercises     Shoulder Instructions      Home Living Family/patient expects to be discharged to:: Private residence Living Arrangements: Alone Available Help at Discharge: Neighbor;Available PRN/intermittently Type of Home: House  Home Access: Stairs to enter Entrance Stairs-Number of Steps: 1   Home Layout: One level     Bathroom Shower/Tub: Occupational psychologist: Standard     Home Equipment: Environmental consultant - 2 wheels;Cane - single point           Prior Functioning/Environment Level of Independence: Independent        Comments: Retired, used to work building houses, drives        OT Problem List: Decreased strength;Decreased activity tolerance;Impaired balance (sitting and/or standing);Decreased cognition;Decreased knowledge of use of DME or AE;Decreased safety awareness;Impaired UE functional use      OT Treatment/Interventions: Self-care/ADL training;DME and/or AE instruction;Balance training;Patient/family education;Therapeutic activities;Cognitive remediation/compensation    OT Goals(Current goals can be found in the care plan section) Acute Rehab OT Goals Patient Stated Goal: pt stating he wants to stop drinking OT Goal Formulation: With patient Time For Goal Achievement: 09/01/20 Potential to Achieve Goals: Good ADL Goals Pt Will Perform Grooming: with supervision;standing Pt Will Perform Upper Body Dressing: with set-up;sitting Pt Will Perform Lower Body Dressing: with supervision;sit to/from stand Pt Will Transfer to Toilet: with supervision;ambulating Pt Will Perform Toileting - Clothing Manipulation and hygiene: with supervision;sit to/from stand Pt Will Perform Tub/Shower Transfer: Shower transfer;with supervision;ambulating  OT Frequency: Min 2X/week   Barriers to D/C:            Co-evaluation              AM-PAC OT "6 Clicks" Daily Activity     Outcome Measure Help from another person eating meals?: None Help from another person taking care of personal grooming?: A Little Help from another person toileting, which includes using toliet, bedpan, or urinal?: A Little Help from another person bathing (including washing, rinsing, drying)?: A Lot Help from another person to put on and taking off regular upper body clothing?: A Little Help from another person to put on and taking off regular lower body clothing?: A Lot 6 Click Score: 17   End of Session Equipment Utilized During Treatment: Gait  belt;Rolling walker  Activity Tolerance: Treatment limited secondary to medical complications (Comment) (hypotensive) Patient left: in chair;with call bell/phone within reach  OT Visit Diagnosis: Unsteadiness on feet (R26.81);Other abnormalities of gait and mobility (R26.89);Muscle weakness (generalized) (M62.81);Other symptoms and signs involving cognitive function                Time: 1950-9326 OT Time Calculation (min): 13 min Charges:  OT General Charges $OT Visit: 1 Visit OT Evaluation $OT Eval Moderate Complexity: 1 Mod  Nestor Lewandowsky, OTR/L Acute Rehabilitation Services Pager: [phone] Office: [phone]  Malka So 08/18/2020, 2:04 PM

## 2020-08-18 NOTE — Progress Notes (Signed)
Daily Rounding Note  08/18/2020, 12:02 PM  LOS: 2 days   SUBJECTIVE:   Chief complaint:  Upper GI  Bleed.  Gastric and duodenal ulcers    Denies n/v, abd pain.  No BM's today but several yesterday, he can not tell me what they looked like.  BPs soft in 90s/70s.    Ultrasound RUQ planned for later tday  OBJECTIVE:         Vital signs in last 24 hours:    Temp:  [97.6 F (36.4 C)-99.2 F (37.3 C)] 97.7 F (36.5 C) (05/16 1100) Pulse Rate:  [83-110] 102 (05/16 1100) Resp:  [9-27] 25 (05/16 1100) BP: (60-104)/(49-71) 93/65 (05/16 1050) SpO2:  [90 %-98 %] 95 % (05/16 1100) Last BM Date: 08/17/20 Filed Weights   08/16/20 2308 08/17/20 0400 08/17/20 0822  Weight: 74.1 kg 74.1 kg 74.1 kg   General: Looks chronically ill and acutely ill as well.  Comfortable and alert   Heart: RRR Chest: clear bil.   No dyspnea Abdomen: soft, NT, ND.  Active BS GU: dark urine Extremities: no CCE Neuro/Psych:  Oriented x 3.  No tremors.    Intake/Output from previous day: 05/15 0701 - 05/16 0700 In: 2112.1 [I.V.:447.2; IV Piggyback:1664.9] Out: 575 [Urine:575]  Intake/Output this shift: Total I/O In: 359.4 [I.V.:44.4; Blood:315] Out: -   Lab Results: Recent Labs    08/17/20 0350 08/17/20 1503 08/18/20 0246 08/18/20 1011  WBC 4.5 6.0 5.6  --   HGB 7.1* 7.6* 6.8* 8.6*  HCT 19.6* 21.2* 19.3* 24.2*  PLT 83* 95* 79*  --    BMET Recent Labs    08/17/20 1503 08/17/20 2000 08/18/20 1011  NA 133* 131* 131*  K 2.9* 3.6 3.4*  CL 98 98 96*  CO2 25 26 27   GLUCOSE 129* 139* 132*  BUN 14 13 8   CREATININE 0.90 0.93 0.78  CALCIUM 7.9* 7.7* 7.9*   LFT Recent Labs    08/16/20 1714  PROT 5.5*  ALBUMIN 3.0*  AST 37  ALT 40  ALKPHOS 66  BILITOT 2.3*   PT/INR Recent Labs    08/16/20 1714  LABPROT 15.4*  INR 1.2   Hepatitis Panel No results for input(s): HEPBSAG, HCVAB, HEPAIGM, HEPBIGM in the last 72  hours.  Studies/Results: CT Head Wo Contrast  Result Date: 08/16/2020 CLINICAL DATA:  Weakness with fall, no loss of consciousness. EXAM: CT HEAD WITHOUT CONTRAST TECHNIQUE: Contiguous axial images were obtained from the base of the skull through the vertex without intravenous contrast. COMPARISON:  January 26, 2020 FINDINGS: Brain: No evidence of acute large vascular territory infarction, hemorrhage, hydrocephalus, extra-axial collection or mass lesion/mass effect. Stable mild age related global parenchymal volume loss. Similar mild burden chronic small-vessel white matter ischemic change. 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 Vascular: No hyperdense vessel. Atherosclerotic calcifications of the internal carotid and vertebral arteries at the skull base. Skull: Stable round hyperdense 1.2 cm subcutaneous nodule posterior to the right occiput, likely sebaceous cyst. Negative for fracture or focal lesion. Sinuses/Orbits: Mucosal thickening of the right maxillary sinus is again visualized. Small left mastoid effusion. Other: Dental hardware. IMPRESSION: 1. No acute intracranial findings. 2. Stable mild age related global parenchymal volume loss and chronic small-vessel white matter ischemic change. 3. Small left mastoid effusion. Electronically Signed   By: Dahlia Bailiff MD   On: 08/16/2020 18:58   DG Chest Port 1 View  Result Date: 08/16/2020 CLINICAL DATA:  Fall, hypotension EXAM: PORTABLE CHEST 1 VIEW COMPARISON:  02/02/2020 FINDINGS: The heart size and mediastinal contours are within normal limits. Redemonstrated coarse, fibrotic opacity at the bilateral lung bases. No acute appearing airspace opacity. Status post right shoulder reverse total arthroplasty. IMPRESSION: Redemonstrated coarse, fibrotic opacity at the bilateral lung bases. No acute appearing airspace opacity. Electronically Signed   By: Eddie Candle M.D.   On: 08/16/2020 18:26   ECHOCARDIOGRAM COMPLETE  Result Date:  08/17/2020 MPRESSIONS  1. Left ventricular ejection fraction, by estimation, is 55 to 60%. The left ventricle has normal function. The left ventricle has no regional wall motion abnormalities. The left ventricular internal cavity size was mildly dilated. There is mild left ventricular hypertrophy. Left ventricular diastolic parameters were normal.  2. Right ventricular systolic function is normal. The right ventricular size is mildly enlarged. Tricuspid regurgitation signal is inadequate for assessing PA pressure.  3. Left atrial size was mildly dilated.  4. The mitral valve is normal in structure. No evidence of mitral valve regurgitation.  5. The aortic valve was not well visualized. Aortic valve regurgitation is not visualized. No aortic stenosis is present.  6. The inferior vena cava is normal in size with greater than 50% respiratory variability, suggesting right atrial pressure of 3 mmHg. Comparison(s): Compared to prior, significant improvement in LV systolic function.   Electronically signed by Oswaldo Milian MD Signature Date/Time: 08/17/2020/3:52:11 PM    Final     ASSESMENT:   *   UGIB, melena. 12/18/2020 EGD: Benign, nonobstructing upper esophageal stenosis.  Clean-based, nonbleeding gastric ulcers.  Portal hypertensive gastropathy.  Duodenitis.  Clean-based, nonbleeding duodenal ulcers.  Pathology pending. Was not taking PPI or H2 blocker at home.   *   ABL anemia.  Macrocytosis. S/p 2 PRBCs.  Hgb 6.8 >> 8.6.  *    Thrombocytopenia.  Platelets 79K. INR normal  *    Hyponatremia.  *    Marked hepatic steatosis on ultrasound in October 2021. Given he has portal hypertensive gastropathy, suspect cirrhosis. T bili 2.3, O/w normal LFTs 2 d ago.   ultrasound RUQ pndg.    *   Solid/cystic 3.4 cm mass in tail of pancreas on CT 01/2019.  Lesion decreased in size on follow-up CT 05/01/2020, felt to likely represent a pseudocyst.  Follow-up CT recommended 3 to 6 months.  CA 19-9 WNL in  December 2021, January 2022. Dr. Henrene Pastor referred the patient to Dr. Rush Landmark for this issue.  04/21/2020 upper EUS/EGD.  Blood vessel crossing the cyst, possibly representing pseudoaneurysm.  Tissue fluid sample not obtained due to concern for pseudoaneurysm and interventional related bleeding.  The cyst itself suggested pancreatic pseudocyst more than malignant mass.  Hyperechoic strands throughout the pancreas.  CBD, CHD normal.  Nonmalignant celiac adenopathy as well as adenopathy in the peripancreatic and porta hepatis region.  Incidental note made of duodenitis, small duodenal ulcers, gastritis and these were biopsied. Pathology showed peptic duodenitis and mild chronic gastritis, no H. pylori.  *    Interstitial lung disease.  *   Chronic alcohol abuse, 1/2 gal bourbon q 4 days.     PLAN   *   Await ultrasound.    *   Ok t stop PPI  Gtt after current bag finishes, then switch to Protonix 40 po bid.      Azucena Freed  08/18/2020, 12:02 PM Phone [phone]

## 2020-08-18 NOTE — Progress Notes (Signed)
NAME:  Fernando [name], MRN:  [id number], DOB:  [birth year], LOS: 2 ADMISSION DATE:  08/16/2020, CONSULTATION DATE:  5/14 REFERRING MD:  Aris Lot CHIEF COMPLAINT:  Generalized weakness  History of Present Illness:  Mr. Fernando [name] is a 65 year old gentleman with PMH as outlined below. He presented to the ED with compliant of generalized weakness and fatigue. He reported that he has fallen 3 times last week and once today prior to presentation. He denied head trauma. He reported exertional dysnoea but no chest pain. Patient denied taking blood thinners however he reported black stools for 2 days.  Patient admitted to drinking 1/2 a bottle of bourbon daily.  Patient was found to be hypotensive upon arrival to the ED. He received 2 liters of NS bolus. His hemoglobin came back to be 5.3 and his occult blood was positive. He was given uncrossed matched blood because of refractory hypotension. Hence patient was referred to [facility] team for admission.  Pertinent  Medical History  Alcohol dependence CHF, EF 55-60% 5/15 Hypertension  Significant Hospital Events: Including procedures, antibiotic start and stop dates in addition to other pertinent events   . 5/14 admitted to ICU . 5/15 EGD - Non-bleeding gastric ulcers, portal hypertensive gastropathy, duodenitis  Interim History / Subjective:   Patient was hypotensive overnight, hgb dropped to 6.8, he received 1 unit pRBCs.  Eating breakfast this AM. Denies any tremors or anxiety. Denies chest pain, dizziness or abdominal pain.  Objective   Blood pressure 98/63, pulse (!) 110, temperature 97.6 F (36.4 C), temperature source Oral, resp. rate 20, height 5\' 6"  (1.676 m), weight 74.1 kg, SpO2 90 %.        Intake/Output Summary (Last 24 hours) at 08/18/2020 0854 Last data filed at 08/18/2020 0830 Gross per 24 hour  Intake 2227.08 ml  Output 575 ml  Net 1652.08 ml   Filed Weights   08/16/20 2308 08/17/20 0400 08/17/20 2423  Weight: 74.1  kg 74.1 kg 74.1 kg    Examination: General: elderly male, sitting up in bed, no acute distress HENT: moist mucus membranes. No scleral icterus. Lungs: coarse lung sounds bilaterally Cardiovascular: tachycardic. Regular rhythm. No murmurs, rubs, gallops. Abdomen: soft, nontender. +bowel sounds. Extremities: warm, dry Neuro: awake, alert GU: deferred  Labs/imaging that I have personally reviewed  (right click and "Reselect all SmartList Selections" daily)  Hgb 6.8 overnight Plt 79 (downtrending 123 on arrival 5/14) Repeat BMP from this AM pending, last night Na 131, K 3.6  Resolved Hospital Problem list   AKI  Assessment & Plan:  Hemorrhagic Shock Acute Upper GI Bleed - portal gastropathy, gastric ulcers, duodenitis Acute Blood Loss Anemia - s/p EGD with no acute intervention required - Cont PPI gtt - Rocephin for empiric treatment of SBP - transfused 3 units PRBCs so far - Repeat H/H post-transfusion this morning, pending - Continue to monitor H/H every 12 hours - If labs remain stable, can transfer out of the ICU today  Alcohol Use Disorder - History of alcohol withdrawal seizures - Pt is now ~60 hours out from last drink and risk of seizures is low. Still at risk of DTs, but physical exam and vitals are not concerning for DT at this time. - Heavy alcohol use, hyponatremia, hypokalemia, may be secondary to cirrhosis. Abd ultrasound from 10/2019 showed hepatic steatosis. AST, ALT wnl this admission, have been elevated in the past. P: - Decrease librium from 25mg  TID --> 10mg  TID - CIWA protocol - Folate, thiamine -  Abdominal u/s to evaluate for cirrhosis  Hx of Heart Failure Hx of Hypertension - hold medications at this time  Electrolyte derangements - Repeat BMP  Best practice (right click and "Reselect all SmartList Selections" daily)  Diet:  Oral Pain/Anxiety/Delirium protocol (if indicated): No VAP protocol (if indicated): Not indicated DVT prophylaxis:  Contraindicated GI prophylaxis: PPI Glucose control:  SSI No Central venous access:  N/A Arterial line:  N/A Foley:  N/A Mobility:  OOB  PT consulted: Yes Last date of multidisciplinary goals of care discussion n/a Code Status:  full code Disposition: ICU  Letitia Neri, MS4

## 2020-08-19 DIAGNOSIS — K922 Gastrointestinal hemorrhage, unspecified: Secondary | ICD-10-CM

## 2020-08-19 DIAGNOSIS — K250 Acute gastric ulcer with hemorrhage: Secondary | ICD-10-CM

## 2020-08-19 DIAGNOSIS — K269 Duodenal ulcer, unspecified as acute or chronic, without hemorrhage or perforation: Secondary | ICD-10-CM | POA: Diagnosis not present

## 2020-08-19 LAB — BASIC METABOLIC PANEL
Anion gap: 10 (ref 5–15)
BUN: 6 mg/dL — ABNORMAL LOW (ref 8–23)
CO2: 26 mmol/L (ref 22–32)
Calcium: 8.4 mg/dL — ABNORMAL LOW (ref 8.9–10.3)
Chloride: 98 mmol/L (ref 98–111)
Creatinine, Ser: 0.78 mg/dL (ref 0.61–1.24)
GFR, Estimated: >60 mL/min (ref >60)
Glucose, Bld: 123 mg/dL — ABNORMAL HIGH (ref 70–99)
Potassium: 4.3 mmol/L (ref 3.5–5.1)
Sodium: 134 mmol/L — ABNORMAL LOW (ref 135–145)

## 2020-08-19 LAB — BRAIN NATRIURETIC PEPTIDE: B Natriuretic Peptide: 449.3 pg/mL — ABNORMAL HIGH (ref 0.0–100.0)

## 2020-08-19 LAB — SURGICAL PATHOLOGY

## 2020-08-19 LAB — GLUCOSE, CAPILLARY
Glucose-Capillary: 134 mg/dL — ABNORMAL HIGH (ref 70–99)
Glucose-Capillary: 110 mg/dL — ABNORMAL HIGH (ref 70–99)
Glucose-Capillary: 144 mg/dL — ABNORMAL HIGH (ref 70–99)

## 2020-08-19 MED ORDER — DEXMEDETOMIDINE HCL IN NACL 400 MCG/100ML IV SOLN: 0.4000 ug/kg/h | INTRAVENOUS | Status: DC

## 2020-08-19 MED ORDER — FUROSEMIDE 10 MG/ML IJ SOLN
20.0000 mg | Freq: Once | INTRAMUSCULAR | Status: AC
  Administered 2020-08-19: 20 mg via INTRAVENOUS
  Filled 2020-08-19: qty 2

## 2020-08-19 MED ORDER — ARFORMOTEROL TARTRATE 15 MCG/2ML IN NEBU
15.0000 ug | INHALATION_SOLUTION | Freq: Two times a day (BID) | RESPIRATORY_TRACT | Status: DC
  Administered 2020-08-19 – 2020-08-29 (×21): 15 ug via RESPIRATORY_TRACT
  Filled 2020-08-19 (×20): qty 2

## 2020-08-19 MED ORDER — FOLIC ACID 1 MG PO TABS
1.0000 mg | ORAL_TABLET | Freq: Every day | ORAL | Status: DC
  Administered 2020-08-19 – 2020-08-29 (×11): 1 mg via ORAL
  Filled 2020-08-19 (×11): qty 1

## 2020-08-19 MED ORDER — METHYLPREDNISOLONE SODIUM SUCC 40 MG IJ SOLR
40.0000 mg | Freq: Every day | INTRAMUSCULAR | Status: AC
  Administered 2020-08-19 – 2020-08-23 (×5): 40 mg via INTRAVENOUS
  Filled 2020-08-19 (×5): qty 1

## 2020-08-19 MED ORDER — LORAZEPAM 2 MG/ML IJ SOLN
1.0000 mg | INTRAMUSCULAR | Status: DC | PRN
Start: 2020-08-19 — End: 2020-08-29
  Administered 2020-08-19 – 2020-08-20 (×4): 2 mg via INTRAVENOUS
  Administered 2020-08-22 (×2): 1 mg via INTRAVENOUS
  Administered 2020-08-22: 2 mg via INTRAVENOUS
  Administered 2020-08-22: 1 mg via INTRAVENOUS
  Administered 2020-08-22 – 2020-08-26 (×5): 2 mg via INTRAVENOUS
  Administered 2020-08-26: 1 mg via INTRAVENOUS
  Administered 2020-08-27: 2 mg via INTRAVENOUS
  Filled 2020-08-19 (×16): qty 1

## 2020-08-19 MED ORDER — DEXMEDETOMIDINE HCL IN NACL 400 MCG/100ML IV SOLN
0.2000 ug/kg/h | INTRAVENOUS | Status: DC
  Administered 2020-08-19: 0.3 ug/kg/h via INTRAVENOUS
  Administered 2020-08-19: 0.2 ug/kg/h via INTRAVENOUS
  Administered 2020-08-19: 0.5 ug/kg/h via INTRAVENOUS
  Administered 2020-08-19: 0.4 ug/kg/h via INTRAVENOUS
  Filled 2020-08-19: qty 100

## 2020-08-19 MED ORDER — NICOTINE 14 MG/24HR TD PT24
14.0000 mg | MEDICATED_PATCH | Freq: Every day | TRANSDERMAL | Status: DC
  Administered 2020-08-19 – 2020-08-29 (×11): 14 mg via TRANSDERMAL
  Filled 2020-08-19 (×11): qty 1

## 2020-08-19 MED ORDER — PANTOPRAZOLE SODIUM 40 MG PO TBEC
40.0000 mg | DELAYED_RELEASE_TABLET | Freq: Two times a day (BID) | ORAL | Status: DC
  Administered 2020-08-19 – 2020-08-29 (×21): 40 mg via ORAL
  Filled 2020-08-19 (×21): qty 1

## 2020-08-19 MED ORDER — ADULT MULTIVITAMIN W/MINERALS CH
1.0000 | ORAL_TABLET | Freq: Every day | ORAL | Status: DC
  Administered 2020-08-19 – 2020-08-29 (×11): 1 via ORAL
  Filled 2020-08-19 (×11): qty 1

## 2020-08-19 MED ORDER — REVEFENACIN 175 MCG/3ML IN SOLN
175.0000 ug | Freq: Every day | RESPIRATORY_TRACT | Status: DC
  Administered 2020-08-19 – 2020-08-28 (×10): 175 ug via RESPIRATORY_TRACT
  Filled 2020-08-19 (×11): qty 3

## 2020-08-19 MED ORDER — THIAMINE HCL 100 MG PO TABS
100.0000 mg | ORAL_TABLET | Freq: Every day | ORAL | Status: DC
  Administered 2020-08-19 – 2020-08-29 (×11): 100 mg via ORAL
  Filled 2020-08-19 (×11): qty 1

## 2020-08-19 MED ORDER — CHLORDIAZEPOXIDE HCL 25 MG PO CAPS
25.0000 mg | ORAL_CAPSULE | Freq: Three times a day (TID) | ORAL | Status: DC
  Administered 2020-08-19 – 2020-08-26 (×22): 25 mg via ORAL
  Filled 2020-08-19 (×22): qty 1

## 2020-08-19 NOTE — Progress Notes (Signed)
Daily Rounding Note  08/19/2020, 9:44 AM  LOS: 3 days   SUBJECTIVE:   Chief complaint:   Acute UGIB.    Gastric and duodenal ulcers.  Portal hypertensive gastropathy.  BPs as low as 69/45 this morning.  Currently 101/88.  Has consistently been having low pressures.  Heart rates in the low 100s to 1 teens. Received dose of IV Lasix for pulmonary edema overnight. Active alcohol withdrawal, Precedex ordered.  OBJECTIVE:         Vital signs in last 24 hours:    Temp:  [96.7 F (35.9 C)-98.9 F (37.2 C)] 96.7 F (35.9 C) (05/17 0715) Pulse Rate:  [88-124] 103 (05/17 0815) Resp:  [8-31] 16 (05/17 0815) BP: (55-126)/(27-96) 101/88 (05/17 0815) SpO2:  [80 %-100 %] 99 % (05/17 0815) Last BM Date: 08/17/20 Filed Weights   08/16/20 2308 08/17/20 0400 08/17/20 0822  Weight: 74.1 kg 74.1 kg 74.1 kg   General: Looks unwell but alert and cooperative.  Comfortable. Heart: RRR. Chest: Wet sounding breathing.  Some increased work of breathing. Abdomen: Nontender, varies between tense and soft.  Bowel sounds active. Extremities: No CCE. Neuro/Psych: Oriented to [facility], year, self.  Psychomotor retardation.  Intake/Output from previous day: 05/16 0701 - 05/17 0700 In: 1318.1 [I.V.:158.7; Blood:315; IV Piggyback:844.4] Out: 2050 [Urine:2050]  Intake/Output this shift: Total I/O In: 2.2 [I.V.:2.2] Out: 450 [Urine:450]  Lab Results: Recent Labs    08/17/20 1503 08/18/20 0246 08/18/20 1011 08/18/20 1507  WBC 6.0 5.6  --  6.4  HGB 7.6* 6.8* 8.6* 9.1*  HCT 21.2* 19.3* 24.2* 25.8*  PLT 95* 79*  --  95*   BMET Recent Labs    08/17/20 2000 08/18/20 1011 08/19/20 0155  NA 131* 131* 134*  K 3.6 3.4* 4.3  CL 98 96* 98  CO2 26 27 26   GLUCOSE 139* 132* 123*  BUN 13 8 6*  CREATININE 0.93 0.78 0.78  CALCIUM 7.7* 7.9* 8.4*   LFT Recent Labs    08/16/20 1714  PROT 5.5*  ALBUMIN 3.0*  AST 37  ALT 40   ALKPHOS 66  BILITOT 2.3*   PT/INR Recent Labs    08/16/20 1714  LABPROT 15.4*  INR 1.2   Hepatitis Panel No results for input(s): HEPBSAG, HCVAB, HEPAIGM, HEPBIGM in the last 72 hours.  Studies/Results: DG Chest Port 1 View  Result Date: 08/18/2020 CLINICAL DATA:  Difficulty breathing EXAM: PORTABLE CHEST 1 VIEW COMPARISON:  08/16/20 FINDINGS: Cardiac shadow remains enlarged. Increasing vascular congestion is noted with some edematous changes bilaterally. No acute bony abnormality is seen. Prior right shoulder replacement is seen. IMPRESSION: Increasing vascular congestion with parenchymal edema. Electronically Signed   By: Inez Catalina M.D.   On: 08/18/2020 23:32   ECHOCARDIOGRAM COMPLETE  Result Date: 08/17/2020 MPRESSIONS  1. Left ventricular ejection fraction, by estimation, is 55 to 60%. The left ventricle has normal function. The left ventricle has no regional wall motion abnormalities. The left ventricular internal cavity size was mildly dilated. There is mild left ventricular hypertrophy. Left ventricular diastolic parameters were normal.  2. Right ventricular systolic function is normal. The right ventricular size is mildly enlarged. Tricuspid regurgitation signal is inadequate for assessing PA pressure.  3. Left atrial size was mildly dilated.  4. The mitral valve is normal in structure. No evidence of mitral valve regurgitation.  5. The aortic valve was not well visualized. Aortic valve regurgitation is not visualized. No aortic stenosis is  present.  6. The inferior vena cava is normal in size with greater than 50% respiratory variability, suggesting right atrial pressure of 3 mmHg. Comparison(s): Compared to prior, significant improvement in LV systolic function.   Electronically signed by Oswaldo Milian MD Signature Date/Time: 08/17/2020/3:52:11 PM    Final  Scheduled Meds: . sodium chloride   Intravenous Once  . chlordiazePOXIDE  25 mg Oral TID  . Chlorhexidine Gluconate  Cloth  6 each Topical Q0600  . folic acid  1 mg Oral Daily  . mouth rinse  15 mL Mouth Rinse BID  . multivitamin with minerals  1 tablet Oral Daily  . nicotine  14 mg Transdermal Daily  . pantoprazole  40 mg Oral BID  . thiamine  100 mg Oral Daily   Continuous Infusions: . cefTRIAXone (ROCEPHIN)  IV Stopped (08/18/20 2220)  . dexmedetomidine (PRECEDEX) IV infusion 0.2 mcg/kg/hr (08/19/20 0800)   PRN Meds:.albuterol, docusate sodium, LORazepam, polyethylene glycol   ASSESMENT:   *   UGIB, melena. 12/18/2020 EGD: Benign, nonobstructing upper esophageal stenosis.  Clean-based, nonbleeding gastric ulcers.  Portal hypertensive gastropathy.  Duodenitis. Clean-based, nonbleeding duodenal ulcers.  Pathology pending. Was not taking PPI or H2 blocker at home.   Protonix 40 po bid in place.  *   ABL anemia.  Macrocytosis. S/p 2 PRBCs.  Hgb 6.8 >> 8.6 >> 9.1.   *   Hypotension.    *    Thrombocytopenia.  Platelets 95.   INR normal  *    Hyponatremia.  *    Marked hepatic steatosis on ultrasound in October 2021 and on repeat US 08/18/20. Given he has portal hypertensive gastropathy, despite not being seen on ultrasound suspect cirrhosis. T bili 2.3, O/w normal LFTs 2 d ago.   Day 4/6 empiric Rocephin for GI bleed inpatient with possible cirrhosis.  *   Solid/cystic 3.4 cm mass in tail of pancreas on CT 01/2019.  Lesion decreased in size on follow-up CT 05/01/2020, felt to likely represent a pseudocyst.  Follow-up CT recommended 3 to 6 months.  CA 19-9 WNL in December 2021, January 2022. Dr. Henrene Pastor referred the patient to Dr. Rush Landmark for this issue.  04/21/2020 upper EUS/EGD.  Blood vessel crossing the cyst, possibly representing pseudoaneurysm.  Tissue fluid sample not obtained due to concern for pseudoaneurysm and interventional related bleeding.  The cyst itself suggested pancreatic pseudocyst more than malignant mass.  Hyperechoic strands throughout the pancreas.  CBD, CHD normal.   Nonmalignant celiac adenopathy as well as adenopathy in the peripancreatic and porta hepatis region.  Incidental note made of duodenitis, small duodenal ulcers, gastritis and these were biopsied. Pathology showed peptic duodenitis and mild chronic gastritis, no H. pylori.  *    Interstitial lung disease.  *   Chronic alcohol abuse, 1/2 gal bourbon q 4 days.    Active withdrawal being managed.   PLAN   *   CBC in AM.  Continue bid Protonix  *    okay to have clear liquids as tolerated, so long as he is alert enough to safely swallow.    Azucena Freed  08/19/2020, 9:44 AM Phone [phone]

## 2020-08-19 NOTE — Progress Notes (Signed)
PT Cancellation Note  Patient Details Name: Fernando [name] MRN: [id number] DOB: Sep 18, 1955   Cancelled Treatment:    Reason Eval/Treat Not Completed: Medical issues which prohibited therapy. Pt in active etoh withdrawal. Will continue to follow.   Shary Decamp [facility] 08/19/2020, 9:44 AM [place] Pager [phone] Office [phone]

## 2020-08-19 NOTE — Progress Notes (Signed)
Agreed with previous RN Assessment.

## 2020-08-19 NOTE — Progress Notes (Signed)
Patient given 2mg  Ativan prn for agitation. Patient exhibiting s/s of withdrawal. Will continue to monitor.

## 2020-08-19 NOTE — Progress Notes (Signed)
NAME:  Fernando [name], MRN:  [id number], DOB:  06-02-1955, LOS: 3 ADMISSION DATE:  08/16/2020, CONSULTATION DATE:  5/14 REFERRING MD:  Aris Lot CHIEF COMPLAINT:  Generalized weakness  History of Present Illness:  Mr. Fernando [name] is a 65 year old gentleman with PMH as outlined below. He presented to the ED with compliant of generalized weakness and fatigue. He reported that he has fallen 3 times last week and once today prior to presentation. He denied head trauma. He reported exertional dysnoea but no chest pain. Patient denied taking blood thinners however he reported black stools for 2 days.  Patient admitted to drinking 1/2 a bottle of bourbon daily.  Patient was found to be hypotensive upon arrival to the ED. He received 2 liters of NS bolus. His hemoglobin came back to be 5.3 and his occult blood was positive. He was given uncrossed matched blood because of refractory hypotension. Hence patient was referred to [facility] team for admission.  Pertinent  Medical History  Alcohol dependence CHF, EF 55-60% 5/15 Hypertension  Significant Hospital Events: Including procedures, antibiotic start and stop dates in addition to other pertinent events   . 5/14 admitted to ICU . 5/15 EGD - Non-bleeding gastric ulcers, portal hypertensive gastropathy, duodenitis  Interim History / Subjective:   Patient had dyspnea and wheezing overnight. Received albuterol breathing treatment. BNP ordered and elevated at 449. CXR showed pulmonary edema. Pt received Lasix 20mg  and IVFs discontinued. Patient also had withdrawal symptoms overnight and received 2mg  Ativan and was started on precedex.  This morning pt reports he does not feel well and is having some difficulty breathing. He does believe that his symptoms have improved since last night. He is on 2L , O2 in upper 90s.  Objective   Blood pressure (!) 112/96, pulse (!) 109, temperature 98.6 F (37 C), temperature source Oral, resp. rate 16, height  5\' 6"  (1.676 m), weight 74.1 kg, SpO2 99 %.        Intake/Output Summary (Last 24 hours) at 08/19/2020 1229 Last data filed at 08/19/2020 1157 Gross per 24 hour  Intake 962.55 ml  Output 2750 ml  Net -1787.45 ml   Filed Weights   08/16/20 2308 08/17/20 0400 08/17/20 0822  Weight: 74.1 kg 74.1 kg 74.1 kg    Examination: General: elderly male, lying in bed. Appears fatigued and pale. No acute distress. HENT: Moist mucus membranes. No scleral icterus. Lungs: Wheezing throughout both lung fields. Equal chest wall expansion. Breathing somewhat labored, but normal respiratory rate. Cardiovascular: Tachycardic. Regular rhythm. No murmurs, rubs or gallops. Abdomen: soft, nontender. Extremities: warm, dry. No lower extremity edema. Neuro: Awake but mildly sedated. Answers most questions appropriately, but intermittently seems confused. GU: deferred Skin: Area of ecchymosis on left upper thigh.  Labs/imaging that I have personally reviewed  (right click and "Reselect all SmartList Selections" daily)  Hgb stable at 9.1 Plt 95 Na 134 K 4.3 BNP 449 CXR vascular congestion  Resolved Hospital Problem list   AKI  Assessment & Plan:  Hemorrhagic Shock Acute Upper GI Bleed - portal gastropathy, gastric ulcers, duodenitis Acute Blood Loss Anemia - s/p EGD with no acute intervention required - Hgb stable overnight, most recently 9.1.  - In total, has received 3 units PRBCs  - GI continues to follow, appreciate recommendations P: - Cont pantoprazole tablet 40mg  BID - Rocephin for empiric treatment of SBP - Daily CBC  Alcohol Use Disorder - History of alcohol withdrawal seizures - Reported last drink evening  of 5/13. Pt > 36 hours from last drink, so risk of seizures is low. Pt did experience withdrawal symptoms overnight, and CIWA was 15. - Heavy alcohol use, hyponatremia, hypokalemia, raises concern for cirrhosis. Abd ultrasound from 10/2019 showed hepatic steatosis. AST, ALT wnl this  admission, have been elevated in the past. P: - Increase Librium back up to 25mg  TID for alcohol withdrawal - Stop precedex - CIWA protocol - Folate, thiamine - Abdominal u/s to evaluate for cirrhosis  Dyspnea - Pt received albuterol breathing treatment and lasix overnight with subjective improvement in symptoms. - CXR showed vascular congestion and interstitial infiltrates consistent with volume overload. Echo performed this hospital admission showed normal LV and RV function with a normal EF (55-60%).  - Patient has a smoking history and wheezing and dyspnea makes a COPD exacerbation a likely diagnosis. Acute heart failure exacerbation also possible, but less likely in setting of recently normal echo and lack of peripheral edema P: - Brovana and yupelri nebs - solumedrol 40mg  qd - albuterol neb prn - may consider additional dose of lasix if symptoms do not improve with breathing treatments  Hx of Heart Failure Hx of Hypertension - hold medications at this time  Electrolyte derangements - Repeat BMP  Best practice (right click and "Reselect all SmartList Selections" daily)  Diet:  Oral Pain/Anxiety/Delirium protocol (if indicated): No VAP protocol (if indicated): Not indicated DVT prophylaxis: Contraindicated GI prophylaxis: PPI Glucose control:  SSI No Central venous access:  N/A Arterial line:  N/A Foley:  N/A Mobility:  OOB  PT consulted: Yes Last date of multidisciplinary goals of care discussion n/a Code Status:  full code Disposition: ICU  Letitia Neri, MS4

## 2020-08-19 NOTE — Progress Notes (Addendum)
eLink Physician-Brief Progress Note Patient Name: LAMOYNE HESSEL DOB: 1955-10-02 MRN: [id number]   Date of Service  08/19/2020  HPI/Events of Note  Patient in active ETOH withdrawal, CXR consistent with pulmonary edema.  eICU Interventions  Lasix 20 mg iv x 1 ordered, ICU ETOH withdrawal protocol ordered, Precedex ordered. Intravenous fluids discontinued.        Kerry Kass Tamanika Heiney 08/19/2020, 2:37 AM

## 2020-08-19 NOTE — Plan of Care (Signed)

## 2020-08-20 DIAGNOSIS — K7030 Alcoholic cirrhosis of liver without ascites: Secondary | ICD-10-CM | POA: Diagnosis not present

## 2020-08-20 DIAGNOSIS — K922 Gastrointestinal hemorrhage, unspecified: Secondary | ICD-10-CM | POA: Diagnosis not present

## 2020-08-20 LAB — BASIC METABOLIC PANEL
Anion gap: 10 (ref 5–15)
BUN: <5 mg/dL — ABNORMAL LOW (ref 8–23)
CO2: 30 mmol/L (ref 22–32)
Calcium: 8.6 mg/dL — ABNORMAL LOW (ref 8.9–10.3)
Chloride: 94 mmol/L — ABNORMAL LOW (ref 98–111)
Creatinine, Ser: 0.72 mg/dL (ref 0.61–1.24)
GFR, Estimated: >60 mL/min (ref >60)
Glucose, Bld: 100 mg/dL — ABNORMAL HIGH (ref 70–99)
Potassium: 3.7 mmol/L (ref 3.5–5.1)
Sodium: 134 mmol/L — ABNORMAL LOW (ref 135–145)

## 2020-08-20 LAB — BPAM RBC
Blood Product Expiration Date: 202205242359
Blood Product Expiration Date: 202206032359
Blood Product Expiration Date: 202206042359
Blood Product Expiration Date: 202206042359
ISSUE DATE / TIME: 202205141720
ISSUE DATE / TIME: 202205141950
ISSUE DATE / TIME: 202205150755
ISSUE DATE / TIME: 202205160606
Unit Type and Rh: 5100
Unit Type and Rh: 5100
Unit Type and Rh: 5100
Unit Type and Rh: 5100

## 2020-08-20 LAB — TYPE AND SCREEN
ABO/RH(D): O POS
Antibody Screen: NEGATIVE
Unit division: 0
Unit division: 0
Unit division: 0
Unit division: 0

## 2020-08-20 LAB — CBC
HCT: 26.3 % — ABNORMAL LOW (ref 39.0–52.0)
Hemoglobin: 8.9 g/dL — ABNORMAL LOW (ref 13.0–17.0)
MCH: 34.6 pg — ABNORMAL HIGH (ref 26.0–34.0)
MCHC: 33.8 g/dL (ref 30.0–36.0)
MCV: 102.3 fL — ABNORMAL HIGH (ref 80.0–100.0)
Platelets: UNDETERMINED 10*3/uL (ref 150–400)
RBC: 2.57 MIL/uL — ABNORMAL LOW (ref 4.22–5.81)
RDW: 19.2 % — ABNORMAL HIGH (ref 11.5–15.5)
WBC: 5.2 10*3/uL (ref 4.0–10.5)
nRBC: 0 % (ref 0.0–0.2)

## 2020-08-20 LAB — GLUCOSE, CAPILLARY
Glucose-Capillary: 120 mg/dL — ABNORMAL HIGH (ref 70–99)
Glucose-Capillary: 127 mg/dL — ABNORMAL HIGH (ref 70–99)
Glucose-Capillary: 152 mg/dL — ABNORMAL HIGH (ref 70–99)

## 2020-08-20 MED ORDER — LACTATED RINGERS IV SOLN: INTRAVENOUS | Status: AC

## 2020-08-20 NOTE — Care Management Important Message (Signed)
Important Message  Patient Details  Name: Fernando [name] MRN: [id number] Date of Birth: [birth year]   Medicare Important Message Given:  Yes     Orbie Pyo 08/20/2020, 2:32 PM

## 2020-08-20 NOTE — Progress Notes (Addendum)
NAME:  KAIKOA MAGRO, MRN:  [id number], DOB:  October 20, 1955, LOS: 4 ADMISSION DATE:  08/16/2020, CONSULTATION DATE:  5/14 REFERRING MD:  Aris Lot CHIEF COMPLAINT:  Generalized weakness  History of Present Illness:  Mr. MERRICK MAGGIO is a 65 year old gentleman with PMH as outlined below. He presented to the ED with compliant of generalized weakness and fatigue. He reported that he has fallen 3 times last week and once today prior to presentation. He denied head trauma. He reported exertional dysnoea but no chest pain. Patient denied taking blood thinners however he reported black stools for 2 days.  Patient admitted to drinking 1/2 a bottle of bourbon daily.  Patient was found to be hypotensive upon arrival to the ED. He received 2 liters of NS bolus. His hemoglobin came back to be 5.3 and his occult blood was positive. He was given uncrossed matched blood because of refractory hypotension. Hence patient was referred to [facility] team for admission.  Pertinent  Medical History  Alcohol dependence CHF, EF 55-60% 5/15 Hypertension  Significant Hospital Events: Including procedures, antibiotic start and stop dates in addition to other pertinent events   . 5/14 admitted to ICU . 5/15 EGD - Non-bleeding gastric ulcers, portal hypertensive gastropathy, duodenitis . 5/17 Moved to progressive care.  Interim History / Subjective:  States that he feels slightly short of breath, denies chest pain.   Objective   Blood pressure (!) 107/59, pulse (!) 111, temperature 98.6 F (37 C), temperature source Axillary, resp. rate 18, height 5\' 6"  (1.676 m), weight 24.4 kg, SpO2 95 %. on RA        Intake/Output Summary (Last 24 hours) at 08/20/2020 0847 Last data filed at 08/19/2020 1849 Gross per 24 hour  Intake 225.48 ml  Output 2875 ml  Net -2649.52 ml   Filed Weights   08/17/20 0400 08/17/20 0822 08/20/20 0354  Weight: 74.1 kg 74.1 kg 24.4 kg    Examination: General: in bed, no acute distress,  appears comfortable  HEENT: MM pink and dry, anicteric, trachea midline  Neuro: GCS 14, confused to location, RASS 0, PERRL25mm CV: S1S2, ST, no m/r/g appreciated PULM: Expiratory wheezes in the upper lobes an in the lower lobes, chest expansion symmetric, strong cough GI: soft, bsx4 active, nontender Extremities: warm/dry, no pretibal edema, capillary refill less than 3 seconds  Skin: no rashes or lesions noted  Labs/imaging that I have personally reviewed  (right click and "Reselect all SmartList Selections" daily)  CBC BMP  Resolved Hospital Problem list   AKI Hemmorhagic Shock  Assessment & Plan:   Acute Blood Loss Anemia, D/T Acute Upper GIB, portal gastropathy, gastric ulcers, duodenitis Tachycardic on exam, dry mucous membranes, denies hematochesia and melena. 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 Unable to obtain at this time due to active D/T. Continue to reassess when appropriate -Continue Rocephin, stop date 5/20. -Continue diet  Alcohol Use Disorder Hx of ETOH withdraw with seizures Reported last drink evening of 5/13. Pt > 36 hours from last drink. 3 doses PRN ativan given overnight. CWIA 10-13 ovenight -Continue CIWA protocol -Folate, thiamine -Librium 25mg  TID  A/E COPD Wheezing on exam, on 2L. History of smoking.  -Continue Brovana and yupelri nebs -Continue solumedrol  40mg  qd -albuterol neb as needed -No diuresing at this time.  -Wean O2 Goal sat 88-94  HX HF HX HTN ECHO shows, 5/15 LVEF 55-60, RVSF normal -Gentle fluid administration as discussed -Continue holding home Coreg and losartan  Electrolyte Derangements- Hyponatremia, Hyperkalemia Na 134>134 -Monitor NA -AM BMP -Replete K  Will  transfer to triad to pick up 5/19 with CCM off  Best practice (right click and "Reselect all SmartList Selections" daily)  Diet:  NPO Pain/Anxiety/Delirium protocol (if indicated): No VAP protocol (if indicated): Not indicated DVT prophylaxis: Contraindicated GI prophylaxis: PPI Glucose control:  SSI No Central venous access:  N/A Arterial line:  N/A Foley:  N/A Mobility:  OOB  PT consulted: Yes Last date of multidisciplinary goals of care discussion n/a Code Status:  full code Disposition: Progressive care  Redmond School., MSN, APRN, AGACNP-BC [facility]  08/20/2020 , 8:48 AM  Please see [email or web address] for pager details  If no response, please call [phone] After hours, please call Elink at [phone]

## 2020-08-20 NOTE — Progress Notes (Signed)
Physical Therapy Treatment Patient Details Name: Fernando [name] MRN: [id number] DOB: 03/01/1956 Today's Date: 08/20/2020    History of Present Illness 65 yo male presented on 5/14 with weakness, fatigue, and falls. Pt found to have Hgb 5.3 and transfused. Pt with acute upper GI bleed. PMH - etoh abuse, chf, HTN, dislocated lt shoulder, rt reverse total shoulder replacement.    PT Comments    Pt was seen for mobility on side of bed with help for sitting balance control to get to fair.  Pt then was assisted to stand and demonstrates limited control of walker, but with min assist to mod assist can get to chair.  Impulsively tries to sit suddenly before getting to chair and had to remind pt about safety and assist him to finish the task.  Pt is unsafe to go home alone and will recommend SNF for safety and intensive rehab to get back his endurance, monitor chronic BP drops and to increase balance and strength of LE's toward safer transition home.  Pt is not likely to be able to go home alone.  Current situation has been dangerous and has led to mult falls.  Follow Up Recommendations  SNF     Equipment Recommendations  None recommended by PT    Recommendations for Other Services       Precautions / Restrictions Precautions Precautions: Fall Precaution Comments: monitor vitals Restrictions Weight Bearing Restrictions: No    Mobility  Bed Mobility Overal bed mobility: Needs Assistance Bed Mobility: Supine to Sit     Supine to sit: Mod assist     General bed mobility comments: mod assist to support up to side of bed    Transfers Overall transfer level: Needs assistance Equipment used: Rolling walker (2 wheeled) Transfers: Sit to/from Stand Sit to Stand: Min assist;Mod assist         General transfer comment: assisted to stand and then to chair  Ambulation/Gait Ambulation/Gait assistance: Mod assist;Min assist Gait Distance (Feet): 4 Feet Assistive device: Rolling walker  (2 wheeled);1 person hand held assist Gait Pattern/deviations: Step-to pattern;Decreased stride length;Wide base of support;Shuffle     General Gait Details: had limited endurance to stand but better BP   Stairs             Wheelchair Mobility    Modified Rankin (Stroke Patients Only)       Balance Overall balance assessment: Needs assistance Sitting-balance support: Feet supported Sitting balance-Leahy Scale: Fair Sitting balance - Comments: lists back with movement of legs   Standing balance support: Bilateral upper extremity supported Standing balance-Leahy Scale: Poor Standing balance comment: walker assist to move and control balance                            Cognition Arousal/Alertness: Awake/alert Behavior During Therapy: Flat affect Overall Cognitive Status: Impaired/Different from baseline Area of Impairment: Awareness;Problem solving;Safety/judgement;Following commands;Memory;Attention;Orientation                 Orientation Level: Situation Current Attention Level: Selective Memory: Decreased short-term memory Following Commands: Follows one step commands with increased time Safety/Judgement: Decreased awareness of safety Awareness: Intellectual Problem Solving: Slow processing;Requires verbal cues General Comments: pt will make unsafe impulsive choices at times      Exercises      General Comments General comments (skin integrity, edema, etc.): pt was seen for mobility on side of bed, unable to step initially toward chair but with another attempt was  more stable and more steady      Pertinent Vitals/Pain Pain Assessment: No/denies pain    Home Living                      Prior Function            PT Goals (current goals can now be found in the care plan section) Acute Rehab PT Goals Patient Stated Goal: none stated Progress towards PT goals: Progressing toward goals    Frequency    Min 3X/week       PT Plan Current plan remains appropriate    Co-evaluation              AM-PAC PT "6 Clicks" Mobility   Outcome Measure  Help needed turning from your back to your side while in a flat bed without using bedrails?: A Little Help needed moving from lying on your back to sitting on the side of a flat bed without using bedrails?: A Little Help needed moving to and from a bed to a chair (including a wheelchair)?: A Little Help needed standing up from a chair using your arms (e.g., wheelchair or bedside chair)?: A Lot Help needed to walk in hospital room?: A Lot Help needed climbing 3-5 steps with a railing? : A Lot 6 Click Score: 15    End of Session Equipment Utilized During Treatment: Gait belt;Oxygen Activity Tolerance: Patient limited by fatigue;Treatment limited secondary to medical complications (Comment) Patient left: in chair;with call bell/phone within reach;with chair alarm set Nurse Communication: Mobility status PT Visit Diagnosis: Unsteadiness on feet (R26.81);Other abnormalities of gait and mobility (R26.89);Muscle weakness (generalized) (M62.81);History of falling (Z91.81)     Time: 1287-8676 PT Time Calculation (min) (ACUTE ONLY): 30 min  Charges:  $Gait Training: 8-22 mins $Therapeutic Activity: 8-22 mins              Ramond Dial 08/20/2020, 5:55 PM Mee Hives, PT MS Acute Rehab Dept. Number: [place] [facility] [place]

## 2020-08-21 DIAGNOSIS — F10230 Alcohol dependence with withdrawal, uncomplicated: Secondary | ICD-10-CM | POA: Diagnosis not present

## 2020-08-21 DIAGNOSIS — I10 Essential (primary) hypertension: Secondary | ICD-10-CM | POA: Diagnosis not present

## 2020-08-21 DIAGNOSIS — K922 Gastrointestinal hemorrhage, unspecified: Secondary | ICD-10-CM | POA: Diagnosis not present

## 2020-08-21 DIAGNOSIS — K7030 Alcoholic cirrhosis of liver without ascites: Secondary | ICD-10-CM | POA: Diagnosis not present

## 2020-08-21 LAB — BASIC METABOLIC PANEL
Anion gap: 8 (ref 5–15)
BUN: <5 mg/dL — ABNORMAL LOW (ref 8–23)
CO2: 33 mmol/L — ABNORMAL HIGH (ref 22–32)
Calcium: 9.2 mg/dL (ref 8.9–10.3)
Chloride: 96 mmol/L — ABNORMAL LOW (ref 98–111)
Creatinine, Ser: 0.75 mg/dL (ref 0.61–1.24)
GFR, Estimated: >60 mL/min (ref >60)
Glucose, Bld: 107 mg/dL — ABNORMAL HIGH (ref 70–99)
Potassium: 3.5 mmol/L (ref 3.5–5.1)
Sodium: 137 mmol/L (ref 135–145)

## 2020-08-21 LAB — CBC
HCT: 29.8 % — ABNORMAL LOW (ref 39.0–52.0)
Hemoglobin: 9.7 g/dL — ABNORMAL LOW (ref 13.0–17.0)
MCH: 34.5 pg — ABNORMAL HIGH (ref 26.0–34.0)
MCHC: 32.6 g/dL (ref 30.0–36.0)
MCV: 106 fL — ABNORMAL HIGH (ref 80.0–100.0)
Platelets: 128 10*3/uL — ABNORMAL LOW (ref 150–400)
RBC: 2.81 MIL/uL — ABNORMAL LOW (ref 4.22–5.81)
RDW: 19.3 % — ABNORMAL HIGH (ref 11.5–15.5)
WBC: 6.5 10*3/uL (ref 4.0–10.5)
nRBC: 0 % (ref 0.0–0.2)

## 2020-08-21 LAB — CANCER ANTIGEN 19-9: CA 19-9: 14 U/mL (ref 0–35)

## 2020-08-21 LAB — GLUCOSE, CAPILLARY
Glucose-Capillary: 114 mg/dL — ABNORMAL HIGH (ref 70–99)
Glucose-Capillary: 129 mg/dL — ABNORMAL HIGH (ref 70–99)
Glucose-Capillary: 163 mg/dL — ABNORMAL HIGH (ref 70–99)
Glucose-Capillary: 117 mg/dL — ABNORMAL HIGH (ref 70–99)

## 2020-08-21 NOTE — Progress Notes (Signed)
Occupational Therapy Treatment Patient Details Name: Fernando [name] MRN: [id number] DOB: 1955/05/06 Today's Date: 08/21/2020    History of present illness 65 yo male presented on 5/14 with weakness, fatigue, and falls. Pt found to have Hgb 5.3 and transfused. Pt with acute upper GI bleed. PMH - etoh abuse, chf, HTN, dislocated lt shoulder, rt reverse total shoulder replacement.   OT comments  Patient progressing slowly toward goals. OT treatment session with focus on functional transfers and safety with RW with patient demonstrating continued impulsivity and posterior LOB x2 indicating increased fall risk. Session also focused on BUE strengthening with HEP. Noted decreased AROM in L shoulder 2/2 previous injury/surgery. Patient would benefit from continued acute OT services in prep for safe d/c to next level of care. Recommendation for SNF remains appropriate.    Follow Up Recommendations  SNF;Supervision/Assistance - 24 hour    Equipment Recommendations  None recommended by OT    Recommendations for Other Services      Precautions / Restrictions Precautions Precautions: Fall Precaution Comments: monitor vitals Restrictions Weight Bearing Restrictions: No       Mobility Bed Mobility Overal bed mobility: Needs Assistance Bed Mobility: Sit to Supine     Supine to sit: Min assist Sit to supine: Supervision   General bed mobility comments: Supervision A for safety. Does not require external assist.    Transfers Overall transfer level: Needs assistance Equipment used: Rolling walker (2 wheeled) Transfers: Sit to/from Stand Sit to Stand: Min assist;Min guard         General transfer comment: Posterior LOB with sit to stand from low recliner requiring Min A to correct. Cues for safety and hand placement. At EOB patient with sat prematurely. Patient is impulsive.    Balance Overall balance assessment: Needs assistance Sitting-balance support: Feet supported Sitting  balance-Leahy Scale: Fair Sitting balance - Comments: Posterior LOB with BUE HEP seated EOB without UE support on bed surface.   Standing balance support: Bilateral upper extremity supported Standing balance-Leahy Scale: Poor Standing balance comment: Reliant on BUE on RW.                           ADL either performed or assessed with clinical judgement   ADL                                               Vision       Perception     Praxis      Cognition Arousal/Alertness: Awake/alert Behavior During Therapy: Flat affect Overall Cognitive Status: Impaired/Different from baseline Area of Impairment: Awareness;Problem solving;Safety/judgement;Following commands;Memory;Attention;Orientation                 Orientation Level: Situation Current Attention Level: Selective Memory: Decreased short-term memory Following Commands: Follows one step commands with increased time Safety/Judgement: Decreased awareness of safety Awareness: Intellectual Problem Solving: Slow processing;Requires verbal cues General Comments: Decreased safety awareness and decreased knowledge of current deficits.        Exercises     Shoulder Instructions       General Comments SpO2 99% upon entry with patient seated in recliner. SpO2 96% on 1L. Max HR 120's with functional transfers.    Pertinent Vitals/ Pain       Pain Assessment: No/denies pain  Home Living  Prior Functioning/Environment              Frequency  Min 2X/week        Progress Toward Goals  OT Goals(current goals can now be found in the care plan section)  Progress towards OT goals: Progressing toward goals  Acute Rehab OT Goals Patient Stated Goal: To get better. OT Goal Formulation: With patient Time For Goal Achievement: 09/01/20 Potential to Achieve Goals: Good ADL Goals Pt Will Perform Grooming: with  supervision;standing Pt Will Perform Upper Body Dressing: with set-up;sitting Pt Will Perform Lower Body Dressing: with supervision;sit to/from stand Pt Will Transfer to Toilet: with supervision;ambulating Pt Will Perform Toileting - Clothing Manipulation and hygiene: with supervision;sit to/from stand Pt Will Perform Tub/Shower Transfer: Shower transfer;with supervision;ambulating  Plan Discharge plan remains appropriate;Frequency remains appropriate    Co-evaluation                 AM-PAC OT "6 Clicks" Daily Activity     Outcome Measure   Help from another person eating meals?: None Help from another person taking care of personal grooming?: A Little Help from another person toileting, which includes using toliet, bedpan, or urinal?: A Little Help from another person bathing (including washing, rinsing, drying)?: A Lot Help from another person to put on and taking off regular upper body clothing?: A Little Help from another person to put on and taking off regular lower body clothing?: A Lot 6 Click Score: 17    End of Session Equipment Utilized During Treatment: Gait belt;Rolling walker  OT Visit Diagnosis: Unsteadiness on feet (R26.81);Other abnormalities of gait and mobility (R26.89);Muscle weakness (generalized) (M62.81);Other symptoms and signs involving cognitive function   Activity Tolerance Patient tolerated treatment well   Patient Left in bed;with call bell/phone within reach;Other (comment) (Bed alarm will not set 2/2 "low weight")   Nurse Communication Mobility status;Other (comment) (Bed alarm does not set.)        Time: 7544-9201 OT Time Calculation (min): 25 min  Charges: OT General Charges $OT Visit: 1 Visit OT Treatments $Therapeutic Activity: 8-22 mins $Therapeutic Exercise: 8-22 mins  Aalia Greulich H. OTR/L Supplemental OT, Department of rehab services [phone]   Fernando Daino R H. 08/21/2020, 2:43 PM

## 2020-08-21 NOTE — Progress Notes (Addendum)
PROGRESS NOTE  [name] [name]:[id number] DOB: Apr 20, 1955 DOA: 08/16/2020 PCP: Chilton Greathouse, MD   LOS: 5 days   Brief narrative: Mr. Fernando [name] is a 65 year old male with past medical history of alcohol abuse, congestive heart failure ejection fraction of 55 to 60%, hypertension and chronic smoking presented to hospital with generalized weakness and fatigue with falls and exertional dyspnea.  He reported having black stools.  He does have history of chronic daily drinking.  In the ED he was hypotensive and received 2 L of normal saline bolus.  Hemoglobin was 5.3 and stool occult was positive.  He was given blood transfusion and was admitted to the ICU for hypotension.  Subsequently, his blood pressure improved.  Patient was then considered stable for transfer out of the ICU.  Assessment/Plan:  Principal Problem:   Acute GI bleeding Active Problems:   Generalized weakness   Essential hypertension   Alcohol dependence (HCC)   Tobacco dependence   Alcoholic cirrhosis of liver without ascites (HCC)  Acute blood Loss Anemia  secondary to upper GI bleed portal gastropathy and gastric ulcers duodenitis.   Acute Upper GIB, portal gastropathy, gastric ulcers, duodenitis Patient got IV fluid resuscitation and PRBC transfusion.  GI was consulted.  Continue Protonix.  GI recommends PPI for next 12 weeks.  She does have history of pancreatic cyst and will need pancreatic CT protocol to assess for his pancreatic cyst.  Prophylactic Rocephin until 520.  Oral diet has been initiated.  Patient had EGD January 2022. Gastric biopsies showed nonspecific reactive gastropathy with erosion.  Patient was advised against NSAIDs.  Alcohol dependence with history of ETOH withdrawalwith seizures Last drink was on 08/15/2020.  Received CIWA protocol.  Denies any withdrawal symptoms at this time.  Continue thiamine folic acid and Librium.   Acute exacerbation of COPD Continue Brovana and yupelri nebs.   Continue Solu-Medrol for now.  Continue albuterol nebulizer as needed.  Patient was counseled against smoking.  Continue nicotine patch.  Chronic diastolic heart failure.  Currently compensated.  Hypertension. Patient was initially hypertensive.  Holding Coreg and losartan.  Blood pressure still on the lower side.  2D echocardiogram with left ventricular ejection fraction of 55 to 60%.  Received IV fluid hydration and PRBC during hospitalization.   Hyponatremia, Improved.   Hyperkalemia Improved.  We will continue to monitor closely.  Debility, deconditioning.  Physical therapy recommends skilled nursing facility placement at this time.  We will continue physical therapy while in the hospital  DVT prophylaxis: SCDs Start: 08/16/20 2028    Code Status: Full code  Family Communication: None  Status is: Inpatient  Remains inpatient appropriate because:IV treatments appropriate due to intensity of illness or inability to take PO, Inpatient level of care appropriate due to severity of illness and Debility deconditioning, CIWA protocol, need for rehabilitation   Dispo: The patient is from: Home              Anticipated d/c is to: Home with home health versus a skilled nursing facility.  PT recommended skilled nursing facility.  We will continue PT while in hospital.              Patient currently is not medically stable to d/c.   Difficult to place patient No   Consultants:  GI  PCCM  Procedures:  PRBC transfusion  Anti-infectives:  Marland Kitchen Rocephin IV  Anti-infectives (From admission, onward)   Start     Dose/Rate Route Frequency Ordered Stop  08/16/20 2200  cefTRIAXone (ROCEPHIN) 2 g in sodium chloride 0.9 % 100 mL IVPB        2 g 200 mL/hr over 30 Minutes Intravenous Every 24 hours 08/16/20 2108 08/21/20 2359       Subjective: Today, patient was seen and examined at bedside.  Is much better than yesterday in terms of energy.  Had a brown stool without any blood.  No  nausea vomiting abdominal pain.  Has mild shortness of breath but no chest pain or sputum production.  Denies any withdrawal symptoms including diaphoresis sweating  Objective: Vitals:   08/21/20 0738 08/21/20 0916  BP: (!) 102/58   Pulse: (!) 114 (!) 106  Resp:    Temp: 97.7 F (36.5 C)   SpO2: 94%     Intake/Output Summary (Last 24 hours) at 08/21/2020 1109 Last data filed at 08/21/2020 0500 Gross per 24 hour  Intake --  Output 600 ml  Net -600 ml   Filed Weights   08/17/20 0822 08/20/20 0354 08/21/20 0607  Weight: 74.1 kg 24.4 kg 24.4 kg   Body mass index is 8.68 kg/m.   Physical Exam: GENERAL: Patient is alert awake and oriented. Not in obvious distress.  On nasal cannula oxygen HENT: No scleral pallor or icterus. Pupils equally reactive to light. Oral mucosa is moist NECK: is supple, no gross swelling noted. CHEST: Diminished breath sounds bilaterally, coarse breath sounds noted CVS: S1 and S2 heard, no murmur. Regular rate and rhythm.  ABDOMEN: Soft, non-tender, bowel sounds are present. EXTREMITIES: No edema. CNS: Cranial nerves are intact. No focal motor deficits. SKIN: warm and dry without rashes.  Data Review: I have personally reviewed the following laboratory data and studies,  CBC: Recent Labs  Lab 08/16/20 1714 08/17/20 0033 08/17/20 1503 08/18/20 0246 08/18/20 1011 08/18/20 1507 08/20/20 0210 08/21/20 0342  WBC 6.3   < > 6.0 5.6  --  6.4 5.2 6.5  NEUTROABS 4.9  --   --   --   --   --   --   --   HGB 5.3*   < > 7.6* 6.8* 8.6* 9.1* 8.9* 9.7*  HCT 15.4*   < > 21.2* 19.3* 24.2* 25.8* 26.3* 29.8*  MCV 117.6*   < > 101.9* 103.2*  --  99.2 102.3* 106.0*  PLT 123*   < > 95* 79*  --  95* PLATELET CLUMPS NOTED ON SMEAR, UNABLE TO ESTIMATE 128*   < > = values in this interval not displayed.   Basic Metabolic Panel: Recent Labs  Lab 08/17/20 0350 08/17/20 1503 08/17/20 2000 08/18/20 0246 08/18/20 1011 08/19/20 0155 08/20/20 0210 08/21/20 0342  NA  132* 133* 131*  --  131* 134* 134* 137  K 3.1* 2.9* 3.6  --  3.4* 4.3 3.7 3.5  CL 95* 98 98  --  96* 98 94* 96*  CO2 24 25 26   --  27 26 30  33*  GLUCOSE 99 129* 139*  --  132* 123* 100* 107*  BUN 16 14 13   --  8 6* <5* <5*  CREATININE 1.03 0.90 0.93  --  0.78 0.78 0.72 0.75  CALCIUM 7.6* 7.9* 7.7*  --  7.9* 8.4* 8.6* 9.2  MG 1.3* 3.0* 2.8* 2.3 2.1  --   --   --   PHOS 1.7* 1.1*  --   --  3.4  --   --   --    Liver Function Tests: Recent Labs  Lab 08/16/20 1714  AST  37  ALT 40  ALKPHOS 66  BILITOT 2.3*  PROT 5.5*  ALBUMIN 3.0*   No results for input(s): LIPASE, AMYLASE in the last 168 hours. No results for input(s): AMMONIA in the last 168 hours. Cardiac Enzymes: No results for input(s): CKTOTAL, CKMB, CKMBINDEX, TROPONINI in the last 168 hours. BNP (last 3 results) Recent Labs    10/25/19 1240 08/19/20 0155  BNP 349.4* 449.3*    ProBNP (last 3 results) No results for input(s): PROBNP in the last 8760 hours.  CBG: Recent Labs  Lab 08/20/20 0011 08/20/20 0618 08/20/20 1207 08/21/20 0130 08/21/20 0600  GLUCAP 120* 127* 152* 114* 129*   Recent Results (from the past 240 hour(s))  Resp Panel by RT-PCR (Flu A&B, Covid) Nasopharyngeal Swab     Status: None   Collection Time: 08/16/20  5:15 PM   Specimen: Nasopharyngeal Swab; Nasopharyngeal(NP) swabs in vial transport medium  Result Value Ref Range Status   SARS Coronavirus 2 by RT PCR NEGATIVE NEGATIVE Final    Comment: (NOTE) SARS-CoV-2 target nucleic acids are NOT DETECTED.  The SARS-CoV-2 RNA is generally detectable in upper respiratory specimens during the acute phase of infection. The lowest concentration of SARS-CoV-2 viral copies this assay can detect is 138 copies/mL. A negative result does not preclude SARS-Cov-2 infection and should not be used as the sole basis for treatment or other patient management decisions. A negative result may occur with  improper specimen collection/handling, submission of  specimen other than nasopharyngeal swab, presence of viral mutation(s) within the areas targeted by this assay, and inadequate number of viral copies(<138 copies/mL). A negative result must be combined with clinical observations, patient history, and epidemiological information. The expected result is Negative.  Fact Sheet for Patients:  EntrepreneurPulse.com.au  Fact Sheet for Healthcare Providers:  IncredibleEmployment.be  This test is no t yet approved or cleared by the [place] FDA and  has been authorized for detection and/or diagnosis of SARS-CoV-2 by FDA under an Emergency Use Authorization (EUA). This EUA will remain  in effect (meaning this test can be used) for the duration of the COVID-19 declaration under Section 564(b)(1) of the Act, 21 U.S.C.section 360bbb-3(b)(1), unless the authorization is terminated  or revoked sooner.       Influenza A by PCR NEGATIVE NEGATIVE Final   Influenza B by PCR NEGATIVE NEGATIVE Final    Comment: (NOTE) The Xpert Xpress SARS-CoV-2/FLU/RSV plus assay is intended as an aid in the diagnosis of influenza from Nasopharyngeal swab specimens and should not be used as a sole basis for treatment. Nasal washings and aspirates are unacceptable for Xpert Xpress SARS-CoV-2/FLU/RSV testing.  Fact Sheet for Patients: EntrepreneurPulse.com.au  Fact Sheet for Healthcare Providers: IncredibleEmployment.be  This test is not yet approved or cleared by the [place] FDA and has been authorized for detection and/or diagnosis of SARS-CoV-2 by FDA under an Emergency Use Authorization (EUA). This EUA will remain in effect (meaning this test can be used) for the duration of the COVID-19 declaration under Section 564(b)(1) of the Act, 21 U.S.C. section 360bbb-3(b)(1), unless the authorization is terminated or revoked.  Performed at [facility], [place] [address]  [place], [place] [address]   MRSA PCR Screening     Status: None   Collection Time: 08/16/20 11:18 PM   Specimen: Nasal Mucosa; Nasopharyngeal  Result Value Ref Range Status   MRSA by PCR NEGATIVE NEGATIVE Final    Comment:        The GeneXpert MRSA Assay (FDA  approved for NASAL specimens only), is one component of a comprehensive MRSA colonization surveillance program. It is not intended to diagnose MRSA infection nor to guide or monitor treatment for MRSA infections. Performed at [facility], [place] [address] [place], [place] [address]      Studies: No results found.   Flora Lipps, MD  [facility] 08/21/2020  If 7PM-7AM, please contact night-coverage

## 2020-08-21 NOTE — Progress Notes (Addendum)
While giving patient morning medications, patient started coughing minimally. Patient states he has throat pain when he swallows. Notified MD L. Pokhrel and asked for speech to evaluate patient.   Also, patient trying to get out of bed and pulling off nasal cannula and progressive monitor leads stating, "I have to get out of here." Pt. Was reoriented and soft hands mitts were placed. Bed alarm on, bed in lowest position, fall wrist bracelet on, call light and belongings in pt.'s reach and yellow non-skid socks on.

## 2020-08-21 NOTE — Plan of Care (Signed)

## 2020-08-21 NOTE — TOC Initial Note (Addendum)
Transition of Care [facility]) - Initial/Assessment Note    Patient Details  Name: Fernando [name] MRN: [id number] Date of Birth: Apr 23, 1955  [facility]) CM/SW Contact:    Benard Halsted, LCSW Phone Number: 08/21/2020, 10:01 AM  Clinical Narrative:                 CSW received consult for possible SNF placement at time of discharge. CSW left voicemail for patient's son, [name]. CSW spoke with patient's contact Jocelyn Lamer. She stated she is patient's neighbor and long time friend/caretaker. She stated patient's son lives two hours away so she is the only "family" he has locally. She reported given patient's current physical needs and fall risk, she feels patient does not need to come home right away. She expressed understanding of PT recommendation and is agreeable to SNF placement at time of discharge, though she thinks he may refuse once he becomes medically cleared and more oriented. She reports preference for [employer] or [employer]. CSW discussed insurance authorization process. Patient has received the COVID vaccines and booster. CSW will send SNF referral out once CIWA score is less.   UPDATE: CSW received call from patient's son, [name]. He is also in agreement with SNF placement and stated Jocelyn Lamer can help choose the facility as he lives in [place].    Expected Discharge Plan: Skilled Nursing Facility Barriers to Discharge: Continued Medical Work up   Patient Goals and CMS Choice Patient states their goals for this hospitalization and ongoing recovery are:: Rehab CMS Medicare.gov Compare Post Acute Care list provided to:: Patient Represenative (must comment) Choice offered to / list presented to :  (Friend/neighbor)  Expected Discharge Plan and Services Expected Discharge Plan: [place] In-house Referral: Clinical Social Work   Post Acute Care Choice: [place] Living arrangements for the past 2 months: [place]                                       Prior Living Arrangements/Services Living arrangements for the past 2 months: Single Family Home Lives with:: Self Patient language and need for interpreter reviewed:: Yes Do you feel safe going back to the place where you live?: Yes      Need for Family Participation in Patient Care: Yes (Comment) Care giver support system in place?: Yes (comment)   Criminal Activity/Legal Involvement Pertinent to Current Situation/Hospitalization: No - Comment as needed  Activities of Daily Living      Permission Sought/Granted Permission sought to share information with : Facility Retail banker granted to share information with : Yes, Verbal Permission Granted  Share Information with NAME: [name]/Vicki  Permission granted to share info w AGENCY: SNFs  Permission granted to share info w Relationship: Son/Neighbor     Emotional Assessment Appearance:: Appears stated age Attitude/Demeanor/Rapport: Unable to Assess Affect (typically observed): Unable to Assess Orientation: : Oriented to Self,Oriented to  Time,Oriented to Situation Alcohol / Substance Use: Alcohol Use Psych Involvement: No (comment)  Admission diagnosis:  Hemorrhagic shock ([place]) [R57.8] Lactic acidosis [E87.2] Acute post-hemorrhagic anemia [D62] Alcohol abuse [F10.10] GI bleed [K92.2] Acute GI bleeding [K92.2] Patient Active Problem List   Diagnosis Date Noted  . Alcoholic cirrhosis of liver without ascites ([place])   . Acute GI bleeding 08/16/2020  . Abnormal CT of the abdomen 05/16/2020  . Pancreatic disease 05/16/2020  . Cyst of pancreas 05/16/2020  .  Acute on chronic combined systolic and diastolic CHF (congestive heart failure) ([place]) 02/05/2020  . Anemia of chronic disease   . Alcoholic hepatitis without ascites   . Portal hypertensive gastropathy ([place])   . Pancreatic mass   . Sinus tachycardia   . AKI (acute kidney injury) ([place]) 01/27/2020  . Fall at home,  initial encounter 01/26/2020  . Hypotension 01/26/2020  . Tobacco dependence 01/26/2020  . Dislocation of prosthetic shoulder joint, initial encounter ([place])   . Essential hypertension   . Dyslipidemia   . Alcohol dependence ([place])   . Pneumonitis   . Generalized weakness 10/25/2019  . Alcoholic hepatitis 25/00/3704  . Sinus tachycardia 10/25/2019  . Hyperammonemia ([place]) 10/25/2019  . Alcohol withdrawal ([place]) 10/25/2019  . Acute hypoxemic respiratory failure ([place]) 10/25/2019  . Macrocytic anemia 10/25/2019  . Thrombocytopenia ([place]) 10/25/2019  . Hypertension   . Gout   . Tobacco abuse   . Stasis dermatitis of both legs 09/04/2018  . Medication monitoring encounter 08/09/2018  . 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 Rotator cuff tear arthropathy, right 06/21/2018   PCP:  [name], MD Pharmacy:   [id number] - [place], [place] [address] [place] [address] Phone: [phone] Fax: [phone]     Social Determinants of Health (SDOH) Interventions    Readmission Risk Interventions No flowsheet data found.

## 2020-08-21 NOTE — Evaluation (Signed)
Clinical/Bedside Swallow Evaluation Patient Details  Name: Fernando [name] MRN: [id number] Date of Birth: 12/22/1955  Today's Date: 08/21/2020 Time: SLP Start Time (ACUTE ONLY): 1702 SLP Stop Time (ACUTE ONLY): 1726 SLP Time Calculation (min) (ACUTE ONLY): 24.32 min  Past Medical History:  Past Medical History:  Diagnosis Date  . Alcohol dependence ([place])   . Arthritis   . Cataract   . Chronic systolic (congestive) heart failure (HCC)    EF 25-30% in 10/2019  . Dyslipidemia   . Essential hypertension   . History of kidney stones   . Hyperlipidemia   . Hypertension   . Pneumonia   . Prosthetic shoulder infection, initial encounter [facility])    Past Surgical History:  Past Surgical History:  Procedure Laterality Date  . BIOPSY  04/21/2020   Procedure: BIOPSY;  Surgeon: Rush Landmark Telford Nab., MD;  Location: [place];  Service: Gastroenterology;;  . BIOPSY  08/17/2020   Procedure: BIOPSY;  Surgeon: Carol Ada, MD;  Location: [place];  Service: Endoscopy;;  . COLONOSCOPY    . COLONOSCOPY     multiple  . ESOPHAGOGASTRODUODENOSCOPY (EGD) WITH PROPOFOL N/A 01/31/2020   Procedure: ESOPHAGOGASTRODUODENOSCOPY (EGD) WITH PROPOFOL;  Surgeon: Doran Stabler, MD;  Location: [place];  Service: Gastroenterology;  Laterality: N/A;  . ESOPHAGOGASTRODUODENOSCOPY (EGD) WITH PROPOFOL N/A 04/21/2020   Procedure: ESOPHAGOGASTRODUODENOSCOPY (EGD) WITH PROPOFOL;  Surgeon: Rush Landmark Telford Nab., MD;  Location: [place];  Service: Gastroenterology;  Laterality: N/A;  . ESOPHAGOGASTRODUODENOSCOPY (EGD) WITH PROPOFOL N/A 08/17/2020   Procedure: ESOPHAGOGASTRODUODENOSCOPY (EGD) WITH PROPOFOL;  Surgeon: Carol Ada, MD;  Location: [place];  Service: Endoscopy;  Laterality: N/A;  . EUS N/A 04/21/2020   Procedure: UPPER ENDOSCOPIC ULTRASOUND (EUS) LINEAR;  Surgeon: Irving Copas., MD;  Location: [place];  Service: Gastroenterology;  Laterality: N/A;  . HERNIA REPAIR  1990   . KNEE SURGERY  1975   left men. repair  . KNEE SURGERY  1975  . REVERSE SHOULDER ARTHROPLASTY Right 06/21/2018   Procedure: REVERSE SHOULDER ARTHROPLASTY;  Surgeon: Hiram Gash, MD;  Location: [facility];  Service: Orthopedics;  Laterality: Right;  . ROTATOR CUFF REPAIR     right  . ROTATOR CUFF REPAIR    . TONSILLECTOMY    . TOTAL SHOULDER REPLACEMENT  2020  . UMBILICAL HERNIA REPAIR     HPI:  Pt is a 65 yo male who presented on 5/14 with weakness, fatigue, and falls. Pt found to have Hgb 5.3, acute upper GI bleed. Upper endoscopy 5/15: Benign-appearing esophageal stenosis, non-bleeding gastric ulcers, duodenitis, non-bleeding duodenal ulcers. Clear liquid diet initiated with recommendation from GI on 5/17 to advance as tolerated. SLP consulted due to signs of aspiration with pills on 5/19 and c/o of odynophagia. PMH - ETOHabuse, chf, HTN, dislocated lt shoulder, rt reverse total shoulder replacement.   Assessment / Plan / Recommendation Clinical Impression  Pt was seen for bedside swallow evaluation and he denied a history of dysphagia. Oral mechanism exam was [facility] and dentition was adequate. He demonstrated symptoms of pharyngeal dysphagia characterized by coughing with liquids via straw and gelatin, and a wet vocal quality with regular texture solids. A modified barium swallow study is recommended to further assess swallow function and it will be planned for 5/20. A dysphagia 1 diet with thin liquids is recommended at this time with observance of swallowing precautions until the study is completed.  SLP Visit Diagnosis: Dysphagia, unspecified (R13.10)    Aspiration Risk  Mild aspiration risk    Diet  Recommendation Thin liquid;Dysphagia 1 (Puree)   Liquid Administration via: Cup;No straw Medication Administration: Whole meds with puree Supervision: Staff to assist with self feeding Compensations: Slow rate;Small sips/bites Postural Changes: Seated upright at 90 degrees    Other   Recommendations Oral Care Recommendations: Oral care BID   Follow up Recommendations  (TBD)      Frequency and Duration min 2x/week  2 weeks       Prognosis Prognosis for Safe Diet Advancement: Good      Swallow Study   General Date of Onset: 08/21/20 HPI: Pt is a 65 yo male who presented on 5/14 with weakness, fatigue, and falls. Pt found to have Hgb 5.3, acute upper GI bleed. Upper endoscopy 5/15: Benign-appearing esophageal stenosis, non-bleeding gastric ulcers, duodenitis, non-bleeding duodenal ulcers. Clear liquid diet initiated with recommendation from GI on 5/17 to advance as tolerated. SLP consulted due to signs of aspiration with pills on 5/19 and c/o of odynophagia. PMH - ETOHabuse, chf, HTN, dislocated lt shoulder, rt reverse total shoulder replacement. Type of Study: Bedside Swallow Evaluation Previous Swallow Assessment: none Diet Prior to this Study: Thin liquids (clear liquids.) Temperature Spikes Noted: No Respiratory Status: Nasal cannula History of Recent Intubation: No Behavior/Cognition: Alert;Pleasant mood;Cooperative Oral Cavity Assessment: Within Functional Limits Oral Care Completed by SLP: No Oral Cavity - Dentition: Adequate natural dentition Vision: Functional for self-feeding Self-Feeding Abilities: Able to feed self Patient Positioning: Upright in chair;Postural control adequate for testing Baseline Vocal Quality: Normal Volitional Cough: Strong Volitional Swallow: Able to elicit    Oral/Motor/Sensory Function Overall Oral Motor/Sensory Function: Within functional limits   Ice Chips Ice chips: Within functional limits Presentation: Spoon   Thin Liquid Thin Liquid: Impaired Presentation: Straw Pharyngeal  Phase Impairments: Cough - Immediate;Throat Clearing - Delayed (with thin liquids via straw and with gelatin)    Nectar Thick Nectar Thick Liquid: Not tested   Honey Thick Honey Thick Liquid: Not tested   Puree Puree: Within functional limits    Solid     Solid: Impaired Presentation: Self Fed Pharyngeal Phase Impairments: Wet Vocal Quality     Leven Hoel I. Hardin Negus, [place], [place] Office number [phone] Pager [phone]  Horton Marshall 08/21/2020,5:30 PM

## 2020-08-21 NOTE — NC FL2 (Signed)
[place] LEVEL OF CARE SCREENING TOOL     IDENTIFICATION  Patient Name: Fernando [name] Birthdate: 1956/04/04 Sex: male Admission Date (Current Location): 08/16/2020  [facility] and [place] Number:  Herbalist and Address:  [facility] [place], [place] [address] [place], [place] [address]      Provider Number: [id number]  Attending Physician Name and Address:  Flora Lipps, MD  Relative Name and Phone Number:  Jocelyn Lamer, friend and neighbor [phone]/ [name], son [phone]    Current Level of Care: Hospital Recommended Level of Care: [place] Prior Approval Number:    Date Approved/Denied:   PASRR Number: [id number]  Discharge Plan: SNF    Current Diagnoses: Patient Active Problem List   Diagnosis Date Noted  . Alcoholic cirrhosis of liver without ascites ([place])   . Acute GI bleeding 08/16/2020  . Abnormal CT of the abdomen 05/16/2020  . Pancreatic disease 05/16/2020  . Cyst of pancreas 05/16/2020  . Acute on chronic combined systolic and diastolic CHF (congestive heart failure) ([place]) 02/05/2020  . Anemia of chronic disease   . Alcoholic hepatitis without ascites   . Portal hypertensive gastropathy ([place])   . Pancreatic mass   . Sinus tachycardia   . AKI (acute kidney injury) ([place]) 01/27/2020  . Fall at home, initial encounter 01/26/2020  . Hypotension 01/26/2020  . Tobacco dependence 01/26/2020  . Dislocation of prosthetic shoulder joint, initial encounter ([place])   . Essential hypertension   . Dyslipidemia   . Alcohol dependence ([place])   . Pneumonitis   . Generalized weakness 10/25/2019  . Alcoholic hepatitis 36/64/4034  . Sinus tachycardia 10/25/2019  . Hyperammonemia ([place]) 10/25/2019  . Alcohol withdrawal ([place]) 10/25/2019  . Acute hypoxemic respiratory failure ([place]) 10/25/2019  . Macrocytic anemia 10/25/2019  . Thrombocytopenia ([place]) 10/25/2019  . Hypertension   . Gout   . Tobacco abuse   . Stasis  dermatitis of both legs 09/04/2018  . Medication monitoring encounter 08/09/2018  . 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[place]) 07/12/2018  . Rotator cuff tear arthropathy, right 06/21/2018    Orientation RESPIRATION BLADDER Height & Weight     Self,Time,Situation  Normal Incontinent,External catheter Weight: 53 lb 12.7 oz (24.4 kg) Height:  5\' 6"  (167.6 cm)  BEHAVIORAL SYMPTOMS/MOOD NEUROLOGICAL BOWEL NUTRITION STATUS      Incontinent Diet (Please see DC Summary)  AMBULATORY STATUS COMMUNICATION OF NEEDS Skin   Extensive Assist Verbally Normal                       Personal Care Assistance Level of Assistance  Bathing,Feeding,Dressing Bathing Assistance: Maximum assistance Feeding assistance: Limited assistance Dressing Assistance: Limited assistance     Functional Limitations Info  Sight Sight Info: Impaired        SPECIAL CARE FACTORS FREQUENCY  PT (By licensed PT),OT (By licensed OT)     PT Frequency: 5x/week OT Frequency: 5x/week            Contractures Contractures Info: Not present    Additional Factors Info  Code Status,Allergies Code Status Info: Full Allergies Info: Atorvastatin, Doxycycline           Current Medications (08/21/2020):  This is the current hospital active medication list Current Facility-Administered Medications  Medication Dose Route Frequency Provider Last Rate Last Admin  . 0.9 %  sodium chloride infusion (Manually program via Guardrails IV Fluids)   Intravenous Once Elmer Sow, MD      .  albuterol (PROVENTIL) (2.5 MG/3ML) 0.083% nebulizer solution 2.5 mg  2.5 mg Nebulization Q6H PRN Frederik Pear, MD   2.5 mg at 08/18/20 2333  . arformoterol (BROVANA) nebulizer solution 15 mcg  15 mcg Nebulization BID Freddi Starr, MD   15 mcg at 08/21/20 0748  . cefTRIAXone (ROCEPHIN) 2 g in sodium chloride 0.9 % 100 mL IVPB  2 g Intravenous Q24H Freda Jackson B, MD 200 mL/hr at 08/20/20 2049 2 g at 08/20/20 2049  .  chlordiazePOXIDE (LIBRIUM) capsule 25 mg  25 mg Oral TID Freddi Starr, MD   25 mg at 08/21/20 0917  . Chlorhexidine Gluconate Cloth 2 % PADS 6 each  6 each Topical Q0600 Carol Ada, MD   6 each at 08/21/20 [phone]  . docusate sodium (COLACE) capsule 100 mg  100 mg Oral BID PRN Carol Ada, MD      . folic acid (FOLVITE) tablet 1 mg  1 mg Oral Daily Frederik Pear, MD   1 mg at 08/21/20 0917  . LORazepam (ATIVAN) injection 1-2 mg  1-2 mg Intravenous Q1H PRN Frederik Pear, MD   2 mg at 08/20/20 1705  . MEDLINE mouth rinse  15 mL Mouth Rinse BID Freddi Starr, MD   15 mL at 08/21/20 0917  . methylPREDNISolone sodium succinate (SOLU-MEDROL) 40 mg/mL injection 40 mg  40 mg Intravenous Daily Freddi Starr, MD   40 mg at 08/21/20 7867  . multivitamin with minerals tablet 1 tablet  1 tablet Oral Daily Frederik Pear, MD   1 tablet at 08/21/20 0918  . nicotine (NICODERM CQ - dosed in mg/24 hours) patch 14 mg  14 mg Transdermal Daily Freddi Starr, MD   14 mg at 08/21/20 0919  . pantoprazole (PROTONIX) EC tablet 40 mg  40 mg Oral BID Vena Rua, PA-C   40 mg at 08/21/20 6720  . polyethylene glycol (MIRALAX / GLYCOLAX) packet 17 g  17 g Oral Daily PRN Carol Ada, MD      . revefenacin [facility]) nebulizer solution 175 mcg  175 mcg Nebulization Daily Freddi Starr, MD   175 mcg at 08/21/20 0750  . thiamine tablet 100 mg  100 mg Oral Daily Frederik Pear, MD   100 mg at 08/21/20 9470     Discharge Medications: Please see discharge summary for a list of discharge medications.  Relevant Imaging Results:  Relevant Lab Results:   Additional Information SSN: [id number]. Pfizer 06/24/19, Pfizer 07/15/19, Moderna 03/18/20  Benard Halsted, LCSW

## 2020-08-21 NOTE — Progress Notes (Addendum)
Physical Therapy Treatment Patient Details Name: Fernando [name] MRN: [id number] DOB: 05-18-1955 Today's Date: 08/21/2020    History of Present Illness 65 yo male presented on 5/14 with weakness, fatigue, and falls. Pt found to have Hgb 5.3 and transfused. Pt with acute upper GI bleed. PMH - etoh abuse, chf, HTN, dislocated lt shoulder, rt reverse total shoulder replacement.    PT Comments    Pt admitted with above diagnosis. Pt incr distance with ambulation using rW with overall good safety with RW. Fatigues quickly and desaturation to 86% on 2L with short distance with pt needing break.  Pt progress slow.    Pt currently with functional limitations due to balance and endurance deficits. Pt will benefit from skilled PT to increase their independence and safety with mobility to allow discharge to the venue listed below.     Follow Up Recommendations  SNF     Equipment Recommendations  None recommended by PT    Recommendations for Other Services       Precautions / Restrictions Precautions Precautions: Fall Precaution Comments: monitor vitals Restrictions Weight Bearing Restrictions: No    Mobility  Bed Mobility Overal bed mobility: Needs Assistance Bed Mobility: Supine to Sit     Supine to sit: Min assist     General bed mobility comments: min assist to support up to side of bed    Transfers Overall transfer level: Needs assistance Equipment used: 2 person hand held assist Transfers: Sit to/from Stand Sit to Stand: Min assist;Min guard         General transfer comment: Pt needed cues for hand placement but was able to stand to RW and support self statically wtih rW.  Ambulation/Gait Ambulation/Gait assistance: Min assist Gait Distance (Feet): 40 Feet Assistive device: Rolling walker (2 wheeled) Gait Pattern/deviations: Step-to pattern;Decreased stride length;Wide base of support;Shuffle   Gait velocity interpretation: <1.31 ft/sec, indicative of household  ambulator General Gait Details: Pt able to ambulate in roomwith RW - fatigues quickly with slightly flexed posture.   Stairs             Wheelchair Mobility    Modified Rankin (Stroke Patients Only)       Balance Overall balance assessment: Needs assistance Sitting-balance support: Feet supported Sitting balance-Leahy Scale: Fair Sitting balance - Comments: lists back with movement of legs   Standing balance support: Bilateral upper extremity supported Standing balance-Leahy Scale: Poor Standing balance comment: Assist for balance with min guard  for static stance and UE support                            Cognition Arousal/Alertness: Awake/alert Behavior During Therapy: Flat affect Overall Cognitive Status: Impaired/Different from baseline Area of Impairment: Awareness;Problem solving;Safety/judgement;Following commands;Memory;Attention;Orientation                 Orientation Level: Situation Current Attention Level: Selective Memory: Decreased short-term memory Following Commands: Follows one step commands with increased time Safety/Judgement: Decreased awareness of safety Awareness: Intellectual Problem Solving: Slow processing;Requires verbal cues General Comments: pt will make unsafe impulsive choices at times      Exercises      General Comments General comments (skin integrity, edema, etc.): 93% on 2L at rest.  Desat to 86% with activity.  HR 110 -130 bpm.  Pre BP 124/80 and to 99/53 after walk.      Pertinent Vitals/Pain Pain Assessment: No/denies pain    Home Living  Prior Function            PT Goals (current goals can now be found in the care plan section) Acute Rehab PT Goals Patient Stated Goal: none stated Progress towards PT goals: Progressing toward goals    Frequency    Min 3X/week      PT Plan Current plan remains appropriate    Co-evaluation              AM-PAC PT "6  Clicks" Mobility   Outcome Measure  Help needed turning from your back to your side while in a flat bed without using bedrails?: A Little Help needed moving from lying on your back to sitting on the side of a flat bed without using bedrails?: A Little Help needed moving to and from a bed to a chair (including a wheelchair)?: A Little Help needed standing up from a chair using your arms (e.g., wheelchair or bedside chair)?: A Little Help needed to walk in hospital room?: A Little Help needed climbing 3-5 steps with a railing? : A Lot 6 Click Score: 17    End of Session Equipment Utilized During Treatment: Gait belt;Oxygen Activity Tolerance: Patient limited by fatigue Patient left: with call bell/phone within reach;in chair;with chair alarm set Nurse Communication: Mobility status PT Visit Diagnosis: Unsteadiness on feet (R26.81);Other abnormalities of gait and mobility (R26.89);Muscle weakness (generalized) (M62.81);History of falling (Z91.81)     Time: 0174-9449 PT Time Calculation (min) (ACUTE ONLY): 21 min  Charges:  $Therapeutic Activity: 8-22 mins                     Novalyn Lajara M,PT Acute Rehab Services [phone] (pager)   Alvira Philips 08/21/2020, 1:59 PM

## 2020-08-22 ENCOUNTER — Inpatient Hospital Stay (HOSPITAL_COMMUNITY): Payer: Medicare Other

## 2020-08-22 DIAGNOSIS — K922 Gastrointestinal hemorrhage, unspecified: Secondary | ICD-10-CM | POA: Diagnosis not present

## 2020-08-22 DIAGNOSIS — K7030 Alcoholic cirrhosis of liver without ascites: Secondary | ICD-10-CM | POA: Diagnosis not present

## 2020-08-22 DIAGNOSIS — I10 Essential (primary) hypertension: Secondary | ICD-10-CM | POA: Diagnosis not present

## 2020-08-22 DIAGNOSIS — F10230 Alcohol dependence with withdrawal, uncomplicated: Secondary | ICD-10-CM | POA: Diagnosis not present

## 2020-08-22 LAB — GLUCOSE, CAPILLARY
Glucose-Capillary: 110 mg/dL — ABNORMAL HIGH (ref 70–99)
Glucose-Capillary: 111 mg/dL — ABNORMAL HIGH (ref 70–99)
Glucose-Capillary: 155 mg/dL — ABNORMAL HIGH (ref 70–99)
Glucose-Capillary: 189 mg/dL — ABNORMAL HIGH (ref 70–99)
Glucose-Capillary: 116 mg/dL — ABNORMAL HIGH (ref 70–99)

## 2020-08-22 LAB — BLOOD GAS, ARTERIAL
Acid-Base Excess: 5.3 mmol/L — ABNORMAL HIGH (ref 0.0–2.0)
Bicarbonate: 28.2 mmol/L — ABNORMAL HIGH (ref 20.0–28.0)
FIO2: 22
O2 Saturation: 99.5 %
Patient temperature: 37
pCO2 arterial: 33 mmHg (ref 32.0–48.0)
pH, Arterial: 7.54 — ABNORMAL HIGH (ref 7.350–7.450)
pO2, Arterial: 148 mmHg — ABNORMAL HIGH (ref 83.0–108.0)

## 2020-08-22 LAB — BASIC METABOLIC PANEL
Anion gap: 7 (ref 5–15)
BUN: 5 mg/dL — ABNORMAL LOW (ref 8–23)
CO2: 31 mmol/L (ref 22–32)
Calcium: 8.8 mg/dL — ABNORMAL LOW (ref 8.9–10.3)
Chloride: 100 mmol/L (ref 98–111)
Creatinine, Ser: 0.8 mg/dL (ref 0.61–1.24)
GFR, Estimated: >60 mL/min (ref >60)
Glucose, Bld: 95 mg/dL (ref 70–99)
Potassium: 3.4 mmol/L — ABNORMAL LOW (ref 3.5–5.1)
Sodium: 138 mmol/L (ref 135–145)

## 2020-08-22 LAB — BRAIN NATRIURETIC PEPTIDE: B Natriuretic Peptide: 488.2 pg/mL — ABNORMAL HIGH (ref 0.0–100.0)

## 2020-08-22 MED ORDER — FUROSEMIDE 10 MG/ML IJ SOLN
40.0000 mg | Freq: Once | INTRAMUSCULAR | Status: AC
  Administered 2020-08-22: 40 mg via INTRAVENOUS
  Filled 2020-08-22: qty 4

## 2020-08-22 MED ORDER — POTASSIUM CHLORIDE CRYS ER 20 MEQ PO TBCR
40.0000 meq | EXTENDED_RELEASE_TABLET | Freq: Once | ORAL | Status: AC
  Administered 2020-08-22: 40 meq via ORAL
  Filled 2020-08-22: qty 2

## 2020-08-22 MED ORDER — MAGNESIUM OXIDE -MG SUPPLEMENT 400 (240 MG) MG PO TABS
400.0000 mg | ORAL_TABLET | Freq: Two times a day (BID) | ORAL | Status: DC
  Administered 2020-08-22 – 2020-08-29 (×15): 400 mg via ORAL
  Filled 2020-08-22 (×15): qty 1

## 2020-08-22 MED ORDER — POTASSIUM CHLORIDE CRYS ER 20 MEQ PO TBCR
40.0000 meq | EXTENDED_RELEASE_TABLET | Freq: Once | ORAL | Status: AC
  Administered 2020-08-22: 40 meq via ORAL

## 2020-08-22 NOTE — Progress Notes (Signed)
Pt. Keeps attempting to climb out of bed and taking off all leads and nasal cannula. Pt. Is very agitated and restless. Pt. Was educated the importance of keeping leads on to monitor HR, O2, and VS. Patient reoriented.

## 2020-08-22 NOTE — Progress Notes (Signed)
Notified by RN that pt has change in mental status. At shift change he was alert and oriented and talking coherently to RN. Now he is confused and not as talkative. His CIWA score is 14. Has wet lung sounds and RT at bedside to give breathing treatment. Has SOB.  Has hx of CHF.  Check CXR and obtain ABG to make sure not hypercapnic.  Given lasix 40 mg IV now. Check BNP Check Head CT with AMS.

## 2020-08-22 NOTE — Plan of Care (Signed)

## 2020-08-22 NOTE — Progress Notes (Signed)
Pt restless per shift. Waiting for IV team for new IV.

## 2020-08-22 NOTE — Progress Notes (Signed)
PROGRESS NOTE  Fernando [name] RXV:[id number] DOB: 1956-03-10 DOA: 08/16/2020 PCP: [name], MD   LOS: 6 days   Brief narrative: Mr. Fernando [name] is a 65 year old male with past medical history of alcohol abuse, congestive heart failure ejection fraction of 55 to 60%, hypertension and chronic smoking presented to hospital with generalized weakness and fatigue with falls and exertional dyspnea.  He reported having black stools.  He does have history of chronic daily drinking.  In the ED he was hypotensive and received 2 L of normal saline bolus.  Hemoglobin was 5.3 and stool occult was positive.  He was given blood transfusion and was admitted to the ICU for hypotension.  Subsequently, his blood pressure improved.  Patient was then considered stable for transfer out of the ICU.  Assessment/Plan:  Principal Problem:   Acute GI bleeding Active Problems:   Generalized weakness   Essential hypertension   Alcohol dependence (HCC)   Tobacco dependence   Alcoholic cirrhosis of liver without ascites (HCC)  Acute blood Loss Anemia  secondary to upper GI bleed portal gastropathy and gastric ulcers duodenitis.   Acute Upper GIB, portal gastropathy, gastric ulcers, duodenitis Patient got IV fluid resuscitation and PRBC transfusion.  GI was consulted due to GI bleed..  Continue Protonix.  GI recommends PPI for next 12 weeks.  Patient does have history of pancreatic cyst and will need pancreatic CT protocol to assess for his pancreatic cyst.  CA 19-9 was 14.  Prophylactic Rocephin until 5/20.  Oral diet has been initiated.  Patient had EGD January 2022. Gastric biopsies showed nonspecific reactive gastropathy with erosion.  Patient was advised against NSAIDs.  Alcohol dependence with history of ETOH withdrawalwith seizures Last drink was on 08/15/2020.  Received CIWA protocol.  Had mild agitation this morning.  Continue with IV Ativan.  Patient is oriented but occasionally confused..  Continue  thiamine folic acid and Librium.    Acute exacerbation of COPD Continue Brovana and yupelri nebs.  Continue Solu-Medrol for now.  Continue albuterol nebulizer as needed.  Patient was counseled against smoking.  Continue nicotine patch.  Chronic diastolic heart failure.  Currently compensated.  Hypertension. Patient was initially hypotensive.  Holding Coreg and losartan.  Blood pressure still on the lower side.  2D echocardiogram with left ventricular ejection fraction of 55 to 60%.  Received IV fluid hydration and PRBC during hospitalization.   Hyponatremia, Improved.  Sodium of 130 today.  Continue to monitor.   Hyperkalemia Improved.  Mild hypokalemia.  We will replenish with oral potassium.  Add magnesium oxide as well.  Debility, deconditioning.  Physical therapy recommends skilled nursing facility placement at this time.  We will continue physical therapy while in the hospital.  DVT prophylaxis: SCDs Start: 08/16/20 2028   Code Status: Full code  Family Communication:  I spoke with the patient's significant other Ms Jocelyn Lamer on the phone and updated her about the clinical condition.  Status is: Inpatient  Remains inpatient appropriate because:IV treatments appropriate due to intensity of illness or inability to take PO, Inpatient level of care appropriate due to severity of illness and Debility deconditioning, CIWA protocol, need for rehabilitation   Dispo: The patient is from: Home              Anticipated d/c is to: Home with home health versus a skilled nursing facility.  PT recommended skilled nursing facility.  We will continue PT while in hospital.  Patient currently is not medically stable to d/c.   Difficult to place patient No  Consultants:  GI  PCCM  Procedures:  PRBC transfusion  Anti-infectives:  Marland Kitchen Rocephin IV 5/14>  Anti-infectives (From admission, onward)   Start     Dose/Rate Route Frequency Ordered Stop   08/16/20 2200  cefTRIAXone  (ROCEPHIN) 2 g in sodium chloride 0.9 % 100 mL IVPB        2 g 200 mL/hr over 30 Minutes Intravenous Every 24 hours 08/16/20 2108 08/21/20 2156       Subjective: Today, patient was seen and examined at bedside.  Nursing staff reported that he was little agitated.  He was oriented and answering appropriately at the time of my exam.  Denies any shortness of breath cough fever chills.  Objective: Vitals:   08/22/20 0756 08/22/20 1000  BP: 100/67 (!) 101/59  Pulse: (!) 110 100  Resp: 15   Temp: 98.4 F (36.9 C)   SpO2: 96%     Intake/Output Summary (Last 24 hours) at 08/22/2020 1136 Last data filed at 08/21/2020 1945 Gross per 24 hour  Intake 240 ml  Output --  Net 240 ml   Filed Weights   08/17/20 0822 08/20/20 0354 08/21/20 0607  Weight: 74.1 kg 24.4 kg 24.4 kg   Body mass index is 8.68 kg/m.   Physical Exam:  GENERAL: Patient is alert awake and oriented but intermittently confused.. Not in obvious distress.  On nasal cannula oxygen HENT: No scleral pallor or icterus. Pupils equally reactive to light. Oral mucosa is moist NECK: is supple, no gross swelling noted. CHEST: Diminished breath sounds bilaterally, coarse breath sounds noted CVS: S1 and S2 heard, no murmur. Regular rate and rhythm.  ABDOMEN: Soft, non-tender, bowel sounds are present. EXTREMITIES: No edema.  No obvious tremors noted. CNS: Cranial nerves are intact. No focal motor deficits. SKIN: warm and dry without rashes.  Data Review: I have personally reviewed the following laboratory data and studies,  CBC: Recent Labs  Lab 08/16/20 1714 08/17/20 0033 08/17/20 1503 08/18/20 0246 08/18/20 1011 08/18/20 1507 08/20/20 0210 08/21/20 0342  WBC 6.3   < > 6.0 5.6  --  6.4 5.2 6.5  NEUTROABS 4.9  --   --   --   --   --   --   --   HGB 5.3*   < > 7.6* 6.8* 8.6* 9.1* 8.9* 9.7*  HCT 15.4*   < > 21.2* 19.3* 24.2* 25.8* 26.3* 29.8*  MCV 117.6*   < > 101.9* 103.2*  --  99.2 102.3* 106.0*  PLT 123*   < >  95* 79*  --  95* PLATELET CLUMPS NOTED ON SMEAR, UNABLE TO ESTIMATE 128*   < > = values in this interval not displayed.   Basic Metabolic Panel: Recent Labs  Lab 08/17/20 0350 08/17/20 1503 08/17/20 2000 08/18/20 0246 08/18/20 1011 08/19/20 0155 08/20/20 0210 08/21/20 0342 08/22/20 0637  NA 132* 133* 131*  --  131* 134* 134* 137 138  K 3.1* 2.9* 3.6  --  3.4* 4.3 3.7 3.5 3.4*  CL 95* 98 98  --  96* 98 94* 96* 100  CO2 24 25 26   --  27 26 30  33* 31  GLUCOSE 99 129* 139*  --  132* 123* 100* 107* 95  BUN 16 14 13   --  8 6* <5* <5* 5*  CREATININE 1.03 0.90 0.93  --  0.78 0.78 0.72 0.75 0.80  CALCIUM 7.6* 7.9* 7.7*  --  7.9* 8.4* 8.6* 9.2 8.8*  MG 1.3* 3.0* 2.8* 2.3 2.1  --   --   --   --   PHOS 1.7* 1.1*  --   --  3.4  --   --   --   --    Liver Function Tests: Recent Labs  Lab 08/16/20 1714  AST 37  ALT 40  ALKPHOS 66  BILITOT 2.3*  PROT 5.5*  ALBUMIN 3.0*   No results for input(s): LIPASE, AMYLASE in the last 168 hours. No results for input(s): AMMONIA in the last 168 hours. Cardiac Enzymes: No results for input(s): CKTOTAL, CKMB, CKMBINDEX, TROPONINI in the last 168 hours. BNP (last 3 results) Recent Labs    10/25/19 1240 08/19/20 0155  BNP 349.4* 449.3*    ProBNP (last 3 results) No results for input(s): PROBNP in the last 8760 hours.  CBG: Recent Labs  Lab 08/21/20 0600 08/21/20 1209 08/21/20 2122 08/22/20 0652 08/22/20 0745  GLUCAP 129* 163* 117* 110* 111*   Recent Results (from the past 240 hour(s))  Resp Panel by RT-PCR (Flu A&B, Covid) Nasopharyngeal Swab     Status: None   Collection Time: 08/16/20  5:15 PM   Specimen: Nasopharyngeal Swab; Nasopharyngeal(NP) swabs in vial transport medium  Result Value Ref Range Status   SARS Coronavirus 2 by RT PCR NEGATIVE NEGATIVE Final    Comment: (NOTE) SARS-CoV-2 target nucleic acids are NOT DETECTED.  The SARS-CoV-2 RNA is generally detectable in upper respiratory specimens during the acute phase of  infection. The lowest concentration of SARS-CoV-2 viral copies this assay can detect is 138 copies/mL. A negative result does not preclude SARS-Cov-2 infection and should not be used as the sole basis for treatment or other patient management decisions. A negative result may occur with  improper specimen collection/handling, submission of specimen other than nasopharyngeal swab, presence of viral mutation(s) within the areas targeted by this assay, and inadequate number of viral copies(<138 copies/mL). A negative result must be combined with clinical observations, patient history, and epidemiological information. The expected result is Negative.  Fact Sheet for Patients:  EntrepreneurPulse.com.au  Fact Sheet for Healthcare Providers:  IncredibleEmployment.be  This test is no t yet approved or cleared by the [place] FDA and  has been authorized for detection and/or diagnosis of SARS-CoV-2 by FDA under an Emergency Use Authorization (EUA). This EUA will remain  in effect (meaning this test can be used) for the duration of the COVID-19 declaration under Section 564(b)(1) of the Act, 21 U.S.C.section 360bbb-3(b)(1), unless the authorization is terminated  or revoked sooner.       Influenza A by PCR NEGATIVE NEGATIVE Final   Influenza B by PCR NEGATIVE NEGATIVE Final    Comment: (NOTE) The Xpert Xpress SARS-CoV-2/FLU/RSV plus assay is intended as an aid in the diagnosis of influenza from Nasopharyngeal swab specimens and should not be used as a sole basis for treatment. Nasal washings and aspirates are unacceptable for Xpert Xpress SARS-CoV-2/FLU/RSV testing.  Fact Sheet for Patients: EntrepreneurPulse.com.au  Fact Sheet for Healthcare Providers: IncredibleEmployment.be  This test is not yet approved or cleared by the [place] FDA and has been authorized for detection and/or diagnosis of SARS-CoV-2  by FDA under an Emergency Use Authorization (EUA). This EUA will remain in effect (meaning this test can be used) for the duration of the COVID-19 declaration under Section 564(b)(1) of the Act, 21 U.S.C. section 360bbb-3(b)(1), unless the authorization is terminated or revoked.  Performed at [facility], [address], [place], [place] [address]   MRSA PCR Screening     Status: None   Collection Time: 08/16/20 11:18 PM   Specimen: Nasal Mucosa; Nasopharyngeal  Result Value Ref Range Status   MRSA by PCR NEGATIVE NEGATIVE Final    Comment:        The GeneXpert MRSA Assay (FDA approved for NASAL specimens only), is one component of a comprehensive MRSA colonization surveillance program. It is not intended to diagnose MRSA infection nor to guide or monitor treatment for MRSA infections. Performed at [facility], [place] [address] [place], [place] [address]      Studies: No results found.   Flora Lipps, MD  [facility] 08/22/2020  If 7PM-7AM, please contact night-coverage

## 2020-08-22 NOTE — Progress Notes (Signed)
Pt is restless and trying to get out of bed for the last 4 hours. This nurse in his room redirecting him multiple times. Pt got wheezy and needed a breathing treatment.

## 2020-08-22 NOTE — TOC Progression Note (Signed)
Transition of Care [facility]) - Progression Note    Patient Details  Name: Fernando [name] MRN: [id number] Date of Birth: [birth year]  Transition of Care [facility] CM/SW Contact  Joanne Chars, LCSW Phone Number: 08/22/2020, 1:05 PM  Clinical Narrative:  Pt referral sent out in hub for SNF.     Expected Discharge Plan: [place] Barriers to Discharge: Continued Medical Work up  Expected Discharge Plan and Services Expected Discharge Plan: [place] In-house Referral: Clinical Social Work   Post Acute Care Choice: [place] Living arrangements for the past 2 months: Single Family Home                                       Social Determinants of Health (SDOH) Interventions    Readmission Risk Interventions No flowsheet data found.

## 2020-08-22 NOTE — Progress Notes (Signed)
Modified Barium Swallow Progress Note  Patient Details  Name: Fernando [name] MRN: [id number] Date of Birth: 19-Apr-1955  Today's Date: 08/22/2020  Modified Barium Swallow completed.  Full report located under Chart Review in the Imaging Section.  Brief recommendations include the following:  Clinical Impression  Pt presented with oropharyngeal dysphagia characterized by intemittent oral holding, reduced bolus cohesion, a pharyngeal delay, mildly reduced lingual retraction, and reduced anterior laryngeal movement. He demonstrated premature spillage to the valleculae and pyriform sinuses, and mild vallecular and pyriform sinus residue. Pharyngeal residue was improved with a liquid wash. Pt exhibited intermittent penetration (PAS 3, 5) of individual sips of thin liquids with trace delayed aspiration (PAS 7). Immediate aspiration (PAS 7) was noted once with thin liquids via straw. Instances of aspiration consistently triggered coughing but this was ineffective in expelling aspirated material. No penetration/aspiration was noted with solids or liquids. It is anticipated that the pt's dyspahgia is at least partly cognitively based. A dysphagia 3 diet with nectar thick liquids will be initiated at this time. SLP will follow for dysphagia treatment and his prognosis for diet advancement is judged to be good at this time.   Swallow Evaluation Recommendations       SLP Diet Recommendations: Dysphagia 3 (Mech soft) solids;Nectar thick liquid   Liquid Administration via: Cup;Straw   Medication Administration: Whole meds with liquid   Supervision: Patient able to self feed   Compensations: Slow rate;Small sips/bites   Postural Changes: Seated upright at 90 degrees   Oral Care Recommendations: Oral care BID;Staff/trained caregiver to provide oral care     [name], [name], [name] Office number [phone] Pager [name] 08/22/2020,2:11 PM

## 2020-08-23 ENCOUNTER — Inpatient Hospital Stay (HOSPITAL_COMMUNITY): Payer: Medicare Other

## 2020-08-23 DIAGNOSIS — F10230 Alcohol dependence with withdrawal, uncomplicated: Secondary | ICD-10-CM | POA: Diagnosis not present

## 2020-08-23 DIAGNOSIS — I10 Essential (primary) hypertension: Secondary | ICD-10-CM | POA: Diagnosis not present

## 2020-08-23 DIAGNOSIS — K922 Gastrointestinal hemorrhage, unspecified: Secondary | ICD-10-CM | POA: Diagnosis not present

## 2020-08-23 DIAGNOSIS — K7030 Alcoholic cirrhosis of liver without ascites: Secondary | ICD-10-CM | POA: Diagnosis not present

## 2020-08-23 LAB — BASIC METABOLIC PANEL
Anion gap: 9 (ref 5–15)
BUN: 6 mg/dL — ABNORMAL LOW (ref 8–23)
CO2: 34 mmol/L — ABNORMAL HIGH (ref 22–32)
Calcium: 9.1 mg/dL (ref 8.9–10.3)
Chloride: 96 mmol/L — ABNORMAL LOW (ref 98–111)
Creatinine, Ser: 0.86 mg/dL (ref 0.61–1.24)
GFR, Estimated: >60 mL/min (ref >60)
Glucose, Bld: 93 mg/dL (ref 70–99)
Potassium: 3.6 mmol/L (ref 3.5–5.1)
Sodium: 139 mmol/L (ref 135–145)

## 2020-08-23 LAB — CBC
HCT: 28.5 % — ABNORMAL LOW (ref 39.0–52.0)
Hemoglobin: 9.2 g/dL — ABNORMAL LOW (ref 13.0–17.0)
MCH: 34.6 pg — ABNORMAL HIGH (ref 26.0–34.0)
MCHC: 32.3 g/dL (ref 30.0–36.0)
MCV: 107.1 fL — ABNORMAL HIGH (ref 80.0–100.0)
Platelets: 176 10*3/uL (ref 150–400)
RBC: 2.66 MIL/uL — ABNORMAL LOW (ref 4.22–5.81)
RDW: 18.7 % — ABNORMAL HIGH (ref 11.5–15.5)
WBC: 5.7 10*3/uL (ref 4.0–10.5)
nRBC: 0 % (ref 0.0–0.2)

## 2020-08-23 LAB — GLUCOSE, CAPILLARY: Glucose-Capillary: 127 mg/dL — ABNORMAL HIGH (ref 70–99)

## 2020-08-23 LAB — MAGNESIUM: Magnesium: 1.5 mg/dL — ABNORMAL LOW (ref 1.7–2.4)

## 2020-08-23 MED ORDER — IPRATROPIUM-ALBUTEROL 0.5-2.5 (3) MG/3ML IN SOLN
3.0000 mL | RESPIRATORY_TRACT | Status: DC | PRN
  Administered 2020-08-23 – 2020-08-25 (×3): 3 mL via RESPIRATORY_TRACT
  Filled 2020-08-23 (×3): qty 3

## 2020-08-23 MED ORDER — LACTATED RINGERS IV BOLUS
1000.0000 mL | Freq: Once | INTRAVENOUS | Status: AC
  Administered 2020-08-23: 1000 mL via INTRAVENOUS

## 2020-08-23 MED ORDER — SODIUM CHLORIDE 0.9 % IV BOLUS
500.0000 mL | Freq: Once | INTRAVENOUS | Status: AC
  Administered 2020-08-23: 500 mL via INTRAVENOUS

## 2020-08-23 MED ORDER — FUROSEMIDE 10 MG/ML IJ SOLN
40.0000 mg | Freq: Once | INTRAMUSCULAR | Status: AC
  Administered 2020-08-23: 40 mg via INTRAVENOUS
  Filled 2020-08-23: qty 4

## 2020-08-23 MED ORDER — GUAIFENESIN-DM 100-10 MG/5ML PO SYRP
10.0000 mL | ORAL_SOLUTION | Freq: Four times a day (QID) | ORAL | Status: DC | PRN
  Administered 2020-08-23 – 2020-08-25 (×5): 10 mL via ORAL
  Filled 2020-08-23 (×5): qty 10

## 2020-08-23 NOTE — Progress Notes (Signed)
PROGRESS NOTE  Fernando [name] UGQ:[id number] DOB: 26-Sep-1955 DOA: 08/16/2020 PCP: Chilton Greathouse, MD   LOS: 7 days   Brief narrative: Fernando [name] is a 65 year old male with past medical history of alcohol abuse, congestive heart failure ejection fraction of 55 to 60%, hypertension and chronic smoking presented to hospital with generalized weakness and fatigue with falls and exertional dyspnea.  Patient reported having black stools.  He does have history of chronic daily drinking.  In the ED, he was hypotensive and received 2 L of normal saline bolus.  Hemoglobin was 5.3 and stool occult was positive.  He was given blood transfusion and was admitted to the ICU for hypotension.  Subsequently, his blood pressure improved.  Patient was then considered stable for transfer out of the ICU.  At this time, patient is extremely deconditioned and has been considered for skilled nursing facility placement.  Assessment/Plan:  Principal Problem:   Acute GI bleeding Active Problems:   Generalized weakness   Essential hypertension   Alcohol dependence (HCC)   Tobacco dependence   Alcoholic cirrhosis of liver without ascites (HCC)  Acute blood Loss Anemia  secondary to upper GI bleed portal gastropathy and gastric ulcers duodenitis.   Acute Upper GIB, portal gastropathy, gastric ulcers, duodenitis Patient got IV fluid resuscitation and PRBC transfusion.  GI was consulted due to GI bleed.  Continue Protonix.  GI recommends PPI for next 12 weeks.  Patient does have history of pancreatic cyst and will need pancreatic CT protocol to assess for his pancreatic cyst.  CA 19-9 was 14.  Patient has completed the prophylactic Rocephin.  Oral diet has been initiated.  Patient had EGD January 2022. Gastric biopsies showed nonspecific reactive gastropathy with erosion.  Patient was advised against NSAIDs.  Alcohol dependence with history of ETOH withdrawal with seizures Last drink was on 08/15/2020.  Received  CIWA protocol.  No overt withdrawal symptoms but was little confused as per the nursing staff overnight.  On IV Ativan..  Patient is oriented at this time.  Continue thiamine folic acid and Librium.    Acute exacerbation of COPD Continue Brovana and yupelri nebs.  Has completed Solu-Medrol.  Continue albuterol nebulizer as needed.  Continue nicotine patch.  Chronic diastolic heart failure.  Currently compensated.  Essential hypertension. Patient was initially hypotensive.  Coreg and losartan on hold.  Blood pressure still on the lower side.  2D echocardiogram with left ventricular ejection fraction of 55 to 60%.  Received IV fluid hydration and PRBC during hospitalization.  We will continue to monitor closely.   Hyponatremia, Improved.  Sodium of 139 today.   Hyperkalemia Normalized at this time  Hypomagnesemia. Continue magnesium oxide orally.  Debility, deconditioning.  Physical therapy recommends skilled nursing facility placement at this time.  We will continue physical therapy while in the hospital.  DVT prophylaxis: SCDs Start: 08/16/20 2028  Code Status: Full code  Family Communication:  None today.  I spoke with the patient's significant other Ms. Chip Boer on the phone and updated her about the clinical condition.  Status is: Inpatient  Remains inpatient appropriate because:IV treatments appropriate due to intensity of illness or inability to take PO, Inpatient level of care appropriate due to severity of illness and Debility deconditioning, CIWA protocol, need for rehabilitation   Dispo: The patient is from: Home              Anticipated d/c is to: Home with home health versus skilled nursing facility.  PT recommended  skilled nursing facility.                Patient currently is not medically stable to d/c.   Difficult to place patient No  Consultants:  GI  PCCM  Procedures:  PRBC transfusion  Anti-infectives:  Marland Kitchen Rocephin IV 5/14>  Anti-infectives (From  admission, onward)   Start     Dose/Rate Route Frequency Ordered Stop   08/16/20 2200  cefTRIAXone (ROCEPHIN) 2 g in sodium chloride 0.9 % 100 mL IVPB        2 g 200 mL/hr over 30 Minutes Intravenous Every 24 hours 08/16/20 2108 08/22/20 1951     Subjective: Today, patient was seen and examined at bedside.  Patient states that he feels okay.  Denies any nausea vomiting fever chills or rigor.  Denies any hallucinations.  Objective: Vitals:   08/23/20 0752 08/23/20 0757  BP: (!) 72/45 (!) 100/49  Pulse: 96   Resp: 17 20  Temp: (!) 97.2 F (36.2 C)   SpO2:      Intake/Output Summary (Last 24 hours) at 08/23/2020 1049 Last data filed at 08/23/2020 0600 Gross per 24 hour  Intake 240 ml  Output 1950 ml  Net -1710 ml   Filed Weights   08/17/20 0822 08/20/20 0354 08/21/20 0607  Weight: 74.1 kg 24.4 kg 24.4 kg   Body mass index is 8.68 kg/m.   Physical Exam:  GENERAL: Patient is alert awake and oriented,. Not in obvious distress.  On nasal cannula oxygen HENT: No scleral pallor or icterus. Pupils equally reactive to light. Oral mucosa is dry. NECK: is supple, no gross swelling noted. CHEST: Diminished breath sounds bilaterally CVS: S1 and S2 heard, no murmur. Regular rate and rhythm.  ABDOMEN: Soft, non-tender, bowel sounds are present. EXTREMITIES: No edema.  No obvious tremors noted. CNS: Cranial nerves are intact. No focal motor deficits. SKIN: warm and dry without rashes.  Data Review: I have personally reviewed the following laboratory data and studies,  CBC: Recent Labs  Lab 08/16/20 1714 08/17/20 0033 08/18/20 0246 08/18/20 1011 08/18/20 1507 08/20/20 0210 08/21/20 0342 08/23/20 0322  WBC 6.3   < > 5.6  --  6.4 5.2 6.5 5.7  NEUTROABS 4.9  --   --   --   --   --   --   --   HGB 5.3*   < > 6.8* 8.6* 9.1* 8.9* 9.7* 9.2*  HCT 15.4*   < > 19.3* 24.2* 25.8* 26.3* 29.8* 28.5*  MCV 117.6*   < > 103.2*  --  99.2 102.3* 106.0* 107.1*  PLT 123*   < > 79*  --  95*  PLATELET CLUMPS NOTED ON SMEAR, UNABLE TO ESTIMATE 128* 176   < > = values in this interval not displayed.   Basic Metabolic Panel: Recent Labs  Lab 08/17/20 0350 08/17/20 1503 08/17/20 2000 08/18/20 0246 08/18/20 1011 08/19/20 0155 08/20/20 0210 08/21/20 0342 08/22/20 0637 08/23/20 0322  NA 132* 133* 131*  --  131* 134* 134* 137 138 139  K 3.1* 2.9* 3.6  --  3.4* 4.3 3.7 3.5 3.4* 3.6  CL 95* 98 98  --  96* 98 94* 96* 100 96*  CO2 24 25 26   --  27 26 30  33* 31 34*  GLUCOSE 99 129* 139*  --  132* 123* 100* 107* 95 93  BUN 16 14 13   --  8 6* <5* <5* 5* 6*  CREATININE 1.03 0.90 0.93  --  0.78 0.78 0.72  0.75 0.80 0.86  CALCIUM 7.6* 7.9* 7.7*  --  7.9* 8.4* 8.6* 9.2 8.8* 9.1  MG 1.3* 3.0* 2.8* 2.3 2.1  --   --   --   --  1.5*  PHOS 1.7* 1.1*  --   --  3.4  --   --   --   --   --    Liver Function Tests: Recent Labs  Lab 08/16/20 1714  AST 37  ALT 40  ALKPHOS 66  BILITOT 2.3*  PROT 5.5*  ALBUMIN 3.0*   No results for input(s): LIPASE, AMYLASE in the last 168 hours. No results for input(s): AMMONIA in the last 168 hours. Cardiac Enzymes: No results for input(s): CKTOTAL, CKMB, CKMBINDEX, TROPONINI in the last 168 hours. BNP (last 3 results) Recent Labs    10/25/19 1240 08/19/20 0155 08/22/20 2134  BNP 349.4* 449.3* 488.2*    ProBNP (last 3 results) No results for input(s): PROBNP in the last 8760 hours.  CBG: Recent Labs  Lab 08/22/20 0745 08/22/20 1306 08/22/20 1549 08/22/20 2143 08/23/20 0654  GLUCAP 111* 155* 189* 116* 127*   Recent Results (from the past 240 hour(s))  Resp Panel by RT-PCR (Flu A&B, Covid) Nasopharyngeal Swab     Status: None   Collection Time: 08/16/20  5:15 PM   Specimen: Nasopharyngeal Swab; Nasopharyngeal(NP) swabs in vial transport medium  Result Value Ref Range Status   SARS Coronavirus 2 by RT PCR NEGATIVE NEGATIVE Final    Comment: (NOTE) SARS-CoV-2 target nucleic acids are NOT DETECTED.  The SARS-CoV-2 RNA is generally  detectable in upper respiratory specimens during the acute phase of infection. The lowest concentration of SARS-CoV-2 viral copies this assay can detect is 138 copies/mL. A negative result does not preclude SARS-Cov-2 infection and should not be used as the sole basis for treatment or other patient management decisions. A negative result may occur with  improper specimen collection/handling, submission of specimen other than nasopharyngeal swab, presence of viral mutation(s) within the areas targeted by this assay, and inadequate number of viral copies(<138 copies/mL). A negative result must be combined with clinical observations, patient history, and epidemiological information. The expected result is Negative.  Fact Sheet for Patients:  EntrepreneurPulse.com.au  Fact Sheet for Healthcare Providers:  IncredibleEmployment.be  This test is no t yet approved or cleared by the [place] FDA and  has been authorized for detection and/or diagnosis of SARS-CoV-2 by FDA under an Emergency Use Authorization (EUA). This EUA will remain  in effect (meaning this test can be used) for the duration of the COVID-19 declaration under Section 564(b)(1) of the Act, 21 U.S.C.section 360bbb-3(b)(1), unless the authorization is terminated  or revoked sooner.       Influenza A by PCR NEGATIVE NEGATIVE Final   Influenza B by PCR NEGATIVE NEGATIVE Final    Comment: (NOTE) The Xpert Xpress SARS-CoV-2/FLU/RSV plus assay is intended as an aid in the diagnosis of influenza from Nasopharyngeal swab specimens and should not be used as a sole basis for treatment. Nasal washings and aspirates are unacceptable for Xpert Xpress SARS-CoV-2/FLU/RSV testing.  Fact Sheet for Patients: EntrepreneurPulse.com.au  Fact Sheet for Healthcare Providers: IncredibleEmployment.be  This test is not yet approved or cleared by the [place] FDA  and has been authorized for detection and/or diagnosis of SARS-CoV-2 by FDA under an Emergency Use Authorization (EUA). This EUA will remain in effect (meaning this test can be used) for the duration of the COVID-19 declaration under Section 564(b)(1) of the Act,  21 U.S.C. section 360bbb-3(b)(1), unless the authorization is terminated or revoked.  Performed at [facility], [place] [address], [place], [place] [address]   MRSA PCR Screening     Status: None   Collection Time: 08/16/20 11:18 PM   Specimen: Nasal Mucosa; Nasopharyngeal  Result Value Ref Range Status   MRSA by PCR NEGATIVE NEGATIVE Final    Comment:        The GeneXpert MRSA Assay (FDA approved for NASAL specimens only), is one component of a comprehensive MRSA colonization surveillance program. It is not intended to diagnose MRSA infection nor to guide or monitor treatment for MRSA infections. Performed at [facility], [place] [address] [place], [place] [address]      Studies: CT HEAD WO CONTRAST  Result Date: 08/22/2020 CLINICAL DATA:  Mental status change. EXAM: CT HEAD WITHOUT CONTRAST TECHNIQUE: Contiguous axial images were obtained from the base of the skull through the vertex without intravenous contrast. COMPARISON:  Head CT 08/16/2020 FINDINGS: Brain: Unchanged degree of atrophy and chronic small vessel ischemia from previous. No intracranial hemorrhage, mass effect, or midline shift. No hydrocephalus. The basilar cisterns are patent. No evidence of territorial infarct or acute ischemia. No extra-axial or intracranial fluid collection. Vascular: Atherosclerosis of skullbase vasculature without hyperdense vessel or abnormal calcification. Skull: No fracture or focal lesion. Sinuses/Orbits: Again seen mucosal thickening involving the right maxillary sinus. Similar opacification of lower left mastoid air cells. Bilateral cataract resection. No acute findings. Other: Right parietal sebaceous cyst.  IMPRESSION: 1. No acute intracranial abnormality. 2. Unchanged atrophy and chronic small vessel ischemia. Electronically Signed   By: Keith Rake M.D.   On: 08/22/2020 23:11   DG CHEST PORT 1 VIEW  Result Date: 08/22/2020 CLINICAL DATA:  Shortness of breath EXAM: PORTABLE CHEST 1 VIEW COMPARISON:  08/18/2020 FINDINGS: Cardiac shadow is stable. Previously seen vascular congestion and edema have resolved in the interval from the prior exam. Minimal basilar atelectasis is noted. No acute bony abnormality is noted. IMPRESSION: Resolution of previously seen vascular congestion and edema. Mild bibasilar atelectasis. Electronically Signed   By: Inez Catalina M.D.   On: 08/22/2020 21:18   DG Swallowing Func-Speech Pathology  Result Date: 08/22/2020 Objective Swallowing Evaluation: Type of Study: MBS-Modified Barium Swallow Study  Patient Details Name: SHAAN RHOADS MRN: [id number] Date of Birth: November 14, 1955 Today's Date: 08/22/2020 Time: SLP Start Time (ACUTE ONLY): 1225 -SLP Stop Time (ACUTE ONLY): 1242 SLP Time Calculation (min) (ACUTE ONLY): 16.25 min Past Medical History: Past Medical History: Diagnosis Date . Alcohol dependence ([place])  . Arthritis  . Cataract  . Chronic systolic (congestive) heart failure (HCC)   EF 25-30% in 10/2019 . Dyslipidemia  . Essential hypertension  . History of kidney stones  . Hyperlipidemia  . Hypertension  . Pneumonia  . Prosthetic shoulder infection, initial encounter [facility])  Past Surgical History: Past Surgical History: Procedure Laterality Date . BIOPSY  04/21/2020  Procedure: BIOPSY;  Surgeon: Rush Landmark Telford Nab., MD;  Location: [facility];  Service: Gastroenterology;; . BIOPSY  08/17/2020  Procedure: BIOPSY;  Surgeon: Carol Ada, MD;  Location: [place] [facility];  Service: Endoscopy;; . COLONOSCOPY   . COLONOSCOPY    multiple . ESOPHAGOGASTRODUODENOSCOPY (EGD) WITH PROPOFOL N/A 01/31/2020  Procedure: ESOPHAGOGASTRODUODENOSCOPY (EGD) WITH PROPOFOL;  Surgeon: Doran Stabler,  MD;  Location: [facility];  Service: Gastroenterology;  Laterality: N/A; . ESOPHAGOGASTRODUODENOSCOPY (EGD) WITH PROPOFOL N/A 04/21/2020  Procedure: ESOPHAGOGASTRODUODENOSCOPY (EGD) WITH PROPOFOL;  Surgeon: Rush Landmark Telford Nab., MD;  Location:  [facility];  Service: Gastroenterology;  Laterality: N/A; . ESOPHAGOGASTRODUODENOSCOPY (EGD) WITH PROPOFOL N/A 08/17/2020  Procedure: ESOPHAGOGASTRODUODENOSCOPY (EGD) WITH PROPOFOL;  Surgeon: Carol Ada, MD;  Location: [facility];  Service: Endoscopy;  Laterality: N/A; . EUS N/A 04/21/2020  Procedure: UPPER ENDOSCOPIC ULTRASOUND (EUS) LINEAR;  Surgeon: Irving Copas., MD;  Location: [facility];  Service: Gastroenterology;  Laterality: N/A; . HERNIA REPAIR  1990 . KNEE SURGERY  1975  left men. repair . KNEE SURGERY  1975 . REVERSE SHOULDER ARTHROPLASTY Right 06/21/2018  Procedure: REVERSE SHOULDER ARTHROPLASTY;  Surgeon: Hiram Gash, MD;  Location: [facility];  Service: Orthopedics;  Laterality: Right; . ROTATOR CUFF REPAIR    right . ROTATOR CUFF REPAIR   . TONSILLECTOMY   . TOTAL SHOULDER REPLACEMENT  2020 . UMBILICAL HERNIA REPAIR   HPI: Pt is a 65 yo male who presented on 5/14 with weakness, fatigue, and falls. Pt found to have Hgb 5.3, acute upper GI bleed. Upper endoscopy 5/15: Benign-appearing esophageal stenosis, non-bleeding gastric ulcers, duodenitis, non-bleeding duodenal ulcers. Clear liquid diet initiated with recommendation from GI on 5/17 to advance as tolerated. SLP consulted due to signs of aspiration with pills on 5/19 and c/o of odynophagia. PMH - ETOHabuse, chf, HTN, dislocated lt shoulder, rt reverse total shoulder replacement.  No data recorded Assessment / Plan / Recommendation CHL IP CLINICAL IMPRESSIONS 08/22/2020 Clinical Impression Pt presented with oropharyngeal dysphagia characterized by intemittent oral holding, reduced bolus cohesion, a pharyngeal delay, mildly reduced lingual retraction, and reduced anterior laryngeal movement. He  demonstrated premature spillage to the valleculae and pyriform sinuses, and mild vallecular and pyriform sinus residue. Pharyngeal residue was improved with a liquid wash. Pt exhibited intermittent penetration (PAS 3, 5) of individual sips of thin liquids with trace delayed aspiration (PAS 7). Immediate aspiration (PAS 7) was noted once with thin liquids via straw. Instances of aspiration consistently triggered coughing but this was ineffective in expelling aspirated material. No penetration/aspiration was noted with solids or liquids. It is anticipated that the pt's dyspahgia is at least partly cognitively based. A dysphagia 3 diet with nectar thick liquids will be initiated at this time. SLP will follow for dysphagia treatment and his prognosis for diet advancement is judged to be good at this time. SLP Visit Diagnosis Dysphagia, oropharyngeal phase (R13.12) Attention and concentration deficit following -- Frontal lobe and executive function deficit following -- Impact on safety and function Mild aspiration risk   CHL IP TREATMENT RECOMMENDATION 08/22/2020 Treatment Recommendations Therapy as outlined in treatment plan below   Prognosis 08/22/2020 Prognosis for Safe Diet Advancement Good Barriers to Reach Goals Cognitive deficits Barriers/Prognosis Comment -- CHL IP DIET RECOMMENDATION 08/22/2020 SLP Diet Recommendations Dysphagia 3 (Mech soft) solids;Nectar thick liquid Liquid Administration via Cup;Straw Medication Administration Whole meds with liquid Compensations Slow rate;Small sips/bites Postural Changes Seated upright at 90 degrees   CHL IP OTHER RECOMMENDATIONS 08/22/2020 Recommended Consults -- Oral Care Recommendations Oral care BID;Staff/trained caregiver to provide oral care Other Recommendations --   CHL IP FOLLOW UP RECOMMENDATIONS 08/22/2020 Follow up Recommendations Skilled Nursing facility   [facility] IP FREQUENCY AND DURATION 08/22/2020 Speech Therapy Frequency (ACUTE ONLY) min 2x/week Treatment Duration 2  weeks      CHL IP ORAL PHASE 08/22/2020 Oral Phase Impaired Oral - Pudding Teaspoon -- Oral - Pudding Cup -- Oral - Honey Teaspoon -- Oral - Honey Cup -- Oral - Nectar Teaspoon -- Oral - Nectar Cup Holding of bolus;Decreased bolus cohesion;Premature spillage Oral - Nectar Straw Holding of bolus;Decreased  bolus cohesion;Premature spillage Oral - Thin Teaspoon -- Oral - Thin Cup Holding of bolus;Decreased bolus cohesion;Premature spillage Oral - Thin Straw Holding of bolus;Decreased bolus cohesion;Premature spillage Oral - Puree Holding of bolus;Premature spillage Oral - Mech Soft -- Oral - Regular Holding of bolus;Delayed oral transit Oral - Multi-Consistency -- Oral - Pill -- Oral Phase - Comment --  CHL IP PHARYNGEAL PHASE 08/22/2020 Pharyngeal Phase Impaired Pharyngeal- Pudding Teaspoon -- Pharyngeal -- Pharyngeal- Pudding Cup -- Pharyngeal -- Pharyngeal- Honey Teaspoon -- Pharyngeal -- Pharyngeal- Honey Cup -- Pharyngeal -- Pharyngeal- Nectar Teaspoon -- Pharyngeal -- Pharyngeal- Nectar Cup Reduced anterior laryngeal mobility;Delayed swallow initiation-pyriform sinuses;Pharyngeal residue - valleculae;Pharyngeal residue - pyriform;Reduced tongue base retraction Pharyngeal -- Pharyngeal- Nectar Straw Reduced anterior laryngeal mobility;Delayed swallow initiation-pyriform sinuses;Pharyngeal residue - valleculae;Pharyngeal residue - pyriform;Reduced tongue base retraction Pharyngeal -- Pharyngeal- Thin Teaspoon -- Pharyngeal -- Pharyngeal- Thin Cup Reduced anterior laryngeal mobility;Delayed swallow initiation-pyriform sinuses;Pharyngeal residue - valleculae;Pharyngeal residue - pyriform;Reduced tongue base retraction;Penetration/Aspiration during swallow;Penetration/Apiration after swallow Pharyngeal Material enters airway, remains ABOVE vocal cords and not ejected out;Material enters airway, CONTACTS cords and not ejected out;Material enters airway, passes BELOW cords and not ejected out despite cough attempt by  patient Pharyngeal- Thin Straw Reduced anterior laryngeal mobility;Delayed swallow initiation-pyriform sinuses;Pharyngeal residue - valleculae;Pharyngeal residue - pyriform;Reduced tongue base retraction;Penetration/Aspiration during swallow Pharyngeal Material enters airway, passes BELOW cords and not ejected out despite cough attempt by patient Pharyngeal- Puree Delayed swallow initiation-vallecula Pharyngeal -- Pharyngeal- Mechanical Soft -- Pharyngeal -- Pharyngeal- Regular Reduced anterior laryngeal mobility;Delayed swallow initiation-pyriform sinuses;Pharyngeal residue - valleculae;Pharyngeal residue - pyriform;Reduced tongue base retraction Pharyngeal -- Pharyngeal- Multi-consistency -- Pharyngeal -- Pharyngeal- Pill Reduced anterior laryngeal mobility;Delayed swallow initiation-pyriform sinuses;Pharyngeal residue - valleculae;Pharyngeal residue - pyriform;Reduced tongue base retraction Pharyngeal -- Pharyngeal Comment --  CHL IP CERVICAL ESOPHAGEAL PHASE 08/22/2020 Cervical Esophageal Phase WFL Pudding Teaspoon -- Pudding Cup -- Honey Teaspoon -- Honey Cup -- Nectar Teaspoon -- Nectar Cup -- Nectar Straw -- Thin Teaspoon -- Thin Cup -- Thin Straw -- Puree -- Mechanical Soft -- Regular -- Multi-consistency -- Pill -- Cervical Esophageal Comment -- Shanika I. Hardin Negus, [place], [place] Office number [phone] Pager [phone] Horton Marshall 08/22/2020, 2:14 PM                Flora Lipps, MD  [facility] 08/23/2020  If 7PM-7AM, please contact night-coverage

## 2020-08-23 NOTE — Progress Notes (Signed)
Patient continues to remove oxygen and monitor and get out of bed unassisted.  Oxygen drops to 80s when oxygen is removed.  Unable to redirect or reorient patient as he is becoming more agitated. Dr Tonie Griffith notified, requested restraints.  See orders

## 2020-08-23 NOTE — Progress Notes (Signed)
Patient continuously tries to get out of bed unassisted.  Patient is agitated and confused at this time, reoriented patient to hospital room, lasts only a short time before patient trying to get up again.   PRN Ativan given for agitation.  Bed alarm set to sensitive setting.  Condom cathether explained to patent.  Call bell within reach.

## 2020-08-23 NOTE — TOC Progression Note (Signed)
Transition of Care [facility]) - Progression Note    Patient Details  Name: Fernando [name] MRN: [id number] Date of Birth: 10-25-1955  Transition of Care [facility]) CM/SW [place], [place] Phone Number: 08/23/2020, 10:36 AM  Clinical Narrative:    CSW left a VM and spoke with Jocelyn Lamer, pt's neighbor to give bed offers. Bed offers were given and medicare. gov was recommended. Jocelyn Lamer noted she would follow up with SW after reviewing facilities. SW will continue to follow.   Expected Discharge Plan: [place] Barriers to Discharge: Continued Medical Work up  Expected Discharge Plan and Services Expected Discharge Plan: [place] In-house Referral: Clinical Social Work   Post Acute Care Choice: [place] Living arrangements for the past 2 months: Single Family Home                                       Social Determinants of Health (SDOH) Interventions    Readmission Risk Interventions No flowsheet data found.

## 2020-08-24 DIAGNOSIS — K922 Gastrointestinal hemorrhage, unspecified: Secondary | ICD-10-CM | POA: Diagnosis not present

## 2020-08-24 DIAGNOSIS — I10 Essential (primary) hypertension: Secondary | ICD-10-CM | POA: Diagnosis not present

## 2020-08-24 DIAGNOSIS — F10230 Alcohol dependence with withdrawal, uncomplicated: Secondary | ICD-10-CM | POA: Diagnosis not present

## 2020-08-24 DIAGNOSIS — K7030 Alcoholic cirrhosis of liver without ascites: Secondary | ICD-10-CM | POA: Diagnosis not present

## 2020-08-24 LAB — CBC
HCT: 24.8 % — ABNORMAL LOW (ref 39.0–52.0)
Hemoglobin: 8.2 g/dL — ABNORMAL LOW (ref 13.0–17.0)
MCH: 35.3 pg — ABNORMAL HIGH (ref 26.0–34.0)
MCHC: 33.1 g/dL (ref 30.0–36.0)
MCV: 106.9 fL — ABNORMAL HIGH (ref 80.0–100.0)
Platelets: 168 10*3/uL (ref 150–400)
RBC: 2.32 MIL/uL — ABNORMAL LOW (ref 4.22–5.81)
RDW: 18.2 % — ABNORMAL HIGH (ref 11.5–15.5)
WBC: 5.4 10*3/uL (ref 4.0–10.5)
nRBC: 0 % (ref 0.0–0.2)

## 2020-08-24 LAB — BASIC METABOLIC PANEL
Anion gap: 8 (ref 5–15)
BUN: 18 mg/dL (ref 8–23)
CO2: 33 mmol/L — ABNORMAL HIGH (ref 22–32)
Calcium: 8.3 mg/dL — ABNORMAL LOW (ref 8.9–10.3)
Chloride: 97 mmol/L — ABNORMAL LOW (ref 98–111)
Creatinine, Ser: 1.23 mg/dL (ref 0.61–1.24)
GFR, Estimated: >60 mL/min (ref >60)
Glucose, Bld: 110 mg/dL — ABNORMAL HIGH (ref 70–99)
Potassium: 3.8 mmol/L (ref 3.5–5.1)
Sodium: 138 mmol/L (ref 135–145)

## 2020-08-24 LAB — PHOSPHORUS: Phosphorus: 4.8 mg/dL — ABNORMAL HIGH (ref 2.5–4.6)

## 2020-08-24 LAB — MAGNESIUM: Magnesium: 1.5 mg/dL — ABNORMAL LOW (ref 1.7–2.4)

## 2020-08-24 LAB — GLUCOSE, CAPILLARY
Glucose-Capillary: 107 mg/dL — ABNORMAL HIGH (ref 70–99)
Glucose-Capillary: 111 mg/dL — ABNORMAL HIGH (ref 70–99)
Glucose-Capillary: 129 mg/dL — ABNORMAL HIGH (ref 70–99)
Glucose-Capillary: 144 mg/dL — ABNORMAL HIGH (ref 70–99)

## 2020-08-24 MED ORDER — STARCH (THICKENING) PO POWD: ORAL | Status: DC | PRN

## 2020-08-24 MED ORDER — FOOD THICKENER (SIMPLYTHICK)
1.0000 | ORAL | Status: DC | PRN
  Filled 2020-08-24: qty 1

## 2020-08-24 NOTE — Progress Notes (Signed)
PROGRESS NOTE  ARLYNN VEASLEY [id number] DOB: 1956-02-18 DOA: 08/16/2020 PCP: [name], MD   LOS: 8 days   Brief narrative: Mr. RAKAN LIMES is a 65 year old male with past medical history of alcohol abuse, congestive heart failure ejection fraction of 55 to 60%, hypertension and chronic smoking presented to hospital with generalized weakness and fatigue with falls and exertional dyspnea.  Patient reported having black stools.  He does have history of chronic daily drinking.  In the ED, he was hypotensive and received 2 L of normal saline bolus.  Hemoglobin was 5.3 and stool occult was positive.  He was given blood transfusion and was admitted to the ICU for hypotension.  Subsequently, his blood pressure improved.  Patient was then considered stable for transfer out of the ICU.  At this time, patient is extremely deconditioned and has been considered for skilled nursing facility placement.  Assessment/Plan:  Principal Problem:   Acute GI bleeding Active Problems:   Generalized weakness   Essential hypertension   Alcohol dependence (HCC)   Tobacco dependence   Alcoholic cirrhosis of liver without ascites (HCC)  Acute blood Loss Anemia  secondary to upper GI bleed portal gastropathy and gastric ulcers duodenitis.   Acute Upper GIB, portal gastropathy, gastric ulcers, duodenitis Patient got IV fluid resuscitation and PRBC transfusion.  GI was consulted due to GI bleed.  Continue Protonix.  GI recommends PPI for next 12 weeks.  Patient does have history of pancreatic cyst and will need pancreatic CT protocol to assess for his pancreatic cyst.  CA 19-9 was 14.  Patient has completed the prophylactic Rocephin.  Oral diet has been initiated.  Patient had EGD January 2022. Gastric biopsies showed nonspecific reactive gastropathy with erosion.  Patient was advised against NSAIDs.  Alcohol dependence with history of ETOH withdrawal with seizures Last drink was on 08/15/2020.  Received  CIWA protocol.  No overt withdrawal symptoms.continue CIWA protocol.  Continue thiamine folic acid and Librium.    Acute exacerbation of COPD Continue Brovana and yupelri nebs.  Has completed Solu-Medrol.  Continue albuterol nebulizer as needed.  Continue nicotine patch.  Chronic diastolic heart failure.  Currently compensated.  Essential hypertension. Will mildly hypotensive yesterday.  Coreg and losartan on hold..  Coreg and losartan on hold.  Blood pressure still on the lower side.  2D echocardiogram with left ventricular ejection fraction of 55 to 60%.  Received IV fluid hydration and PRBC during hospitalization.  We will continue to monitor closely.   Hyponatremia, Improved.  Sodium of 138 today.   Hyperkalemia Normalized at this time.  Latest potassium of 3.8.  Hypomagnesemia. Continue magnesium oxide orally.  Magnesium of 1.5 today.  Debility, deconditioning.  Physical therapy recommends skilled nursing facility placement at this time.    DVT prophylaxis: SCDs Start: 08/16/20 2028  Code Status: Full code  Family Communication:  None today.    Status is: Inpatient  Remains inpatient appropriate because:IV treatments appropriate due to intensity of illness or inability to take PO, Inpatient level of care appropriate due to severity of illness and Debility deconditioning, CIWA protocol, need for rehabilitation   Dispo: The patient is from: Home              Anticipated d/c is to: Home with home health versus skilled nursing facility.  PT recommended skilled nursing facility.                Patient currently is not medically stable to d/c.   Difficult  to place patient No  Consultants:  GI  PCCM  Procedures:  PRBC transfusion  Anti-infectives:  Marland Kitchen Rocephin IV 5/14>  Anti-infectives (From admission, onward)   Start     Dose/Rate Route Frequency Ordered Stop   08/16/20 2200  cefTRIAXone (ROCEPHIN) 2 g in sodium chloride 0.9 % 100 mL IVPB        2 g 200 mL/hr  over 30 Minutes Intravenous Every 24 hours 08/16/20 2108 08/22/20 1951     Subjective: Today, patient was seen and examined at bedside.  Complains of being weak.  Sitting on the chair.  Denies any pain, nausea, vomiting, tremors or hallucinations.   Objective: Vitals:   08/24/20 0749 08/24/20 0801  BP:  (!) 93/56  Pulse:  100  Resp:  18  Temp:  98.6 F (37 C)  SpO2: 98% 98%    Intake/Output Summary (Last 24 hours) at 08/24/2020 1126 Last data filed at 08/24/2020 0340 Gross per 24 hour  Intake 1101.76 ml  Output --  Net 1101.76 ml   Filed Weights   08/17/20 0822 08/20/20 0354 08/21/20 0607  Weight: 74.1 kg 24.4 kg 24.4 kg   Body mass index is 8.68 kg/m.   Physical Exam:  General:  Average built, not in obvious distress, on nasal cannula oxygen HENT:   No scleral pallor or icterus noted. Oral mucosa is moist.  Chest: Diminished breath sounds bilaterally.   CVS: S1 &S2 heard. No murmur.  Regular rate and rhythm. Abdomen: Soft, nontender, nondistended.  Bowel sounds are heard.   Extremities: No cyanosis, clubbing or edema.  Peripheral pulses are palpable. Psych: Alert, awake and oriented, normal mood CNS:  No cranial nerve deficits.  Power equal in all extremities.   Skin: Warm and dry.  No rashes noted.   Data Review: I have personally reviewed the following laboratory data and studies,  CBC: Recent Labs  Lab 08/18/20 1507 08/20/20 0210 08/21/20 0342 08/23/20 0322 08/24/20 0058  WBC 6.4 5.2 6.5 5.7 5.4  HGB 9.1* 8.9* 9.7* 9.2* 8.2*  HCT 25.8* 26.3* 29.8* 28.5* 24.8*  MCV 99.2 102.3* 106.0* 107.1* 106.9*  PLT 95* PLATELET CLUMPS NOTED ON SMEAR, UNABLE TO ESTIMATE 128* 176 168   Basic Metabolic Panel: Recent Labs  Lab 08/17/20 1503 08/17/20 2000 08/18/20 0246 08/18/20 1011 08/19/20 0155 08/20/20 0210 08/21/20 0342 08/22/20 0637 08/23/20 0322 08/24/20 0058  NA 133* 131*  --  131*   < > 134* 137 138 139 138  K 2.9* 3.6  --  3.4*   < > 3.7 3.5 3.4* 3.6  3.8  CL 98 98  --  96*   < > 94* 96* 100 96* 97*  CO2 25 26  --  27   < > 30 33* 31 34* 33*  GLUCOSE 129* 139*  --  132*   < > 100* 107* 95 93 110*  BUN 14 13  --  8   < > <5* <5* 5* 6* 18  CREATININE 0.90 0.93  --  0.78   < > 0.72 0.75 0.80 0.86 1.23  CALCIUM 7.9* 7.7*  --  7.9*   < > 8.6* 9.2 8.8* 9.1 8.3*  MG 3.0* 2.8* 2.3 2.1  --   --   --   --  1.5* 1.5*  PHOS 1.1*  --   --  3.4  --   --   --   --   --  4.8*   < > = values in this interval not displayed.  Liver Function Tests: No results for input(s): AST, ALT, ALKPHOS, BILITOT, PROT, ALBUMIN in the last 168 hours. No results for input(s): LIPASE, AMYLASE in the last 168 hours. No results for input(s): AMMONIA in the last 168 hours. Cardiac Enzymes: No results for input(s): CKTOTAL, CKMB, CKMBINDEX, TROPONINI in the last 168 hours. BNP (last 3 results) Recent Labs    10/25/19 1240 08/19/20 0155 08/22/20 2134  BNP 349.4* 449.3* 488.2*    ProBNP (last 3 results) No results for input(s): PROBNP in the last 8760 hours.  CBG: Recent Labs  Lab 08/22/20 2143 08/23/20 0654 08/23/20 2336 08/24/20 0639 08/24/20 0800  GLUCAP 116* 127* 129* 107* 144*   Recent Results (from the past 240 hour(s))  Resp Panel by RT-PCR (Flu A&B, Covid) Nasopharyngeal Swab     Status: None   Collection Time: 08/16/20  5:15 PM   Specimen: Nasopharyngeal Swab; Nasopharyngeal(NP) swabs in vial transport medium  Result Value Ref Range Status   SARS Coronavirus 2 by RT PCR NEGATIVE NEGATIVE Final    Comment: (NOTE) SARS-CoV-2 target nucleic acids are NOT DETECTED.  The SARS-CoV-2 RNA is generally detectable in upper respiratory specimens during the acute phase of infection. The lowest concentration of SARS-CoV-2 viral copies this assay can detect is 138 copies/mL. A negative result does not preclude SARS-Cov-2 infection and should not be used as the sole basis for treatment or other patient management decisions. A negative result may occur with   improper specimen collection/handling, submission of specimen other than nasopharyngeal swab, presence of viral mutation(s) within the areas targeted by this assay, and inadequate number of viral copies(<138 copies/mL). A negative result must be combined with clinical observations, patient history, and epidemiological information. The expected result is Negative.  Fact Sheet for Patients:  EntrepreneurPulse.com.au  Fact Sheet for Healthcare Providers:  IncredibleEmployment.be  This test is no t yet approved or cleared by the [place] FDA and  has been authorized for detection and/or diagnosis of SARS-CoV-2 by FDA under an Emergency Use Authorization (EUA). This EUA will remain  in effect (meaning this test can be used) for the duration of the COVID-19 declaration under Section 564(b)(1) of the Act, 21 U.S.C.section 360bbb-3(b)(1), unless the authorization is terminated  or revoked sooner.       Influenza A by PCR NEGATIVE NEGATIVE Final   Influenza B by PCR NEGATIVE NEGATIVE Final    Comment: (NOTE) The Xpert Xpress SARS-CoV-2/FLU/RSV plus assay is intended as an aid in the diagnosis of influenza from Nasopharyngeal swab specimens and should not be used as a sole basis for treatment. Nasal washings and aspirates are unacceptable for Xpert Xpress SARS-CoV-2/FLU/RSV testing.  Fact Sheet for Patients: EntrepreneurPulse.com.au  Fact Sheet for Healthcare Providers: IncredibleEmployment.be  This test is not yet approved or cleared by the [place] FDA and has been authorized for detection and/or diagnosis of SARS-CoV-2 by FDA under an Emergency Use Authorization (EUA). This EUA will remain in effect (meaning this test can be used) for the duration of the COVID-19 declaration under Section 564(b)(1) of the Act, 21 U.S.C. section 360bbb-3(b)(1), unless the authorization is terminated  or revoked.  Performed at [facility], [place] [address] [place], [place] [address]   MRSA PCR Screening     Status: None   Collection Time: 08/16/20 11:18 PM   Specimen: Nasal Mucosa; Nasopharyngeal  Result Value Ref Range Status   MRSA by PCR NEGATIVE NEGATIVE Final    Comment:        The GeneXpert  MRSA Assay (FDA approved for NASAL specimens only), is one component of a comprehensive MRSA colonization surveillance program. It is not intended to diagnose MRSA infection nor to guide or monitor treatment for MRSA infections. Performed at [facility], [place] [address] [place], [place] [address]      Studies: CT HEAD WO CONTRAST  Result Date: 08/22/2020 CLINICAL DATA:  Mental status change. EXAM: CT HEAD WITHOUT CONTRAST TECHNIQUE: Contiguous axial images were obtained from the base of the skull through the vertex without intravenous contrast. COMPARISON:  Head CT 08/16/2020 FINDINGS: Brain: Unchanged degree of atrophy and chronic small vessel ischemia from previous. No intracranial hemorrhage, mass effect, or midline shift. No hydrocephalus. The basilar cisterns are patent. No evidence of territorial infarct or acute ischemia. No extra-axial or intracranial fluid collection. Vascular: Atherosclerosis of skullbase vasculature without hyperdense vessel or abnormal calcification. Skull: No fracture or focal lesion. Sinuses/Orbits: Again seen mucosal thickening involving the right maxillary sinus. Similar opacification of lower left mastoid air cells. Bilateral cataract resection. No acute findings. Other: Right parietal sebaceous cyst. IMPRESSION: 1. No acute intracranial abnormality. 2. Unchanged atrophy and chronic small vessel ischemia. Electronically Signed   By: Keith Rake M.D.   On: 08/22/2020 23:11   DG CHEST PORT 1 VIEW  Result Date: 08/23/2020 CLINICAL DATA:  Shortness of breath. EXAM: PORTABLE CHEST 1 VIEW COMPARISON:  Aug 22, 2020 FINDINGS: Mildly decreased  lung volumes are seen with very mild, diffuse, chronic appearing increased interstitial lung markings. Mild scarring and/or atelectasis is seen within the bilateral lung bases. There is no evidence of a pleural effusion or pneumothorax. The cardiac silhouette is mildly enlarged and unchanged in size. A radiopaque right shoulder replacement is seen. Degenerative changes are noted throughout the thoracic spine. IMPRESSION: Mild bibasilar scarring and/or atelectasis. Electronically Signed   By: Virgina Norfolk M.D.   On: 08/23/2020 23:52   DG CHEST PORT 1 VIEW  Result Date: 08/22/2020 CLINICAL DATA:  Shortness of breath EXAM: PORTABLE CHEST 1 VIEW COMPARISON:  08/18/2020 FINDINGS: Cardiac shadow is stable. Previously seen vascular congestion and edema have resolved in the interval from the prior exam. Minimal basilar atelectasis is noted. No acute bony abnormality is noted. IMPRESSION: Resolution of previously seen vascular congestion and edema. Mild bibasilar atelectasis. Electronically Signed   By: Inez Catalina M.D.   On: 08/22/2020 21:18   DG Swallowing Func-Speech Pathology  Result Date: 08/22/2020 Objective Swallowing Evaluation: Type of Study: MBS-Modified Barium Swallow Study  Patient Details Name: MICAIAH LITLE MRN: [id number] Date of Birth: 09-18-1955 Today's Date: 08/22/2020 Time: SLP Start Time (ACUTE ONLY): 1225 -SLP Stop Time (ACUTE ONLY): 1242 SLP Time Calculation (min) (ACUTE ONLY): 16.25 min Past Medical History: Past Medical History: Diagnosis Date . Alcohol dependence ([place])  . Arthritis  . Cataract  . Chronic systolic (congestive) heart failure (HCC)   EF 25-30% in 10/2019 . Dyslipidemia  . Essential hypertension  . History of kidney stones  . Hyperlipidemia  . Hypertension  . Pneumonia  . Prosthetic shoulder infection, initial encounter [facility])  Past Surgical History: Past Surgical History: Procedure Laterality Date . BIOPSY  04/21/2020  Procedure: BIOPSY;  Surgeon: Rush Landmark Telford Nab., MD;   Location: [place];  Service: Gastroenterology;; . BIOPSY  08/17/2020  Procedure: BIOPSY;  Surgeon: Carol Ada, MD;  Location: [place];  Service: Endoscopy;; . COLONOSCOPY   . COLONOSCOPY    multiple . ESOPHAGOGASTRODUODENOSCOPY (EGD) WITH PROPOFOL N/A 01/31/2020  Procedure: ESOPHAGOGASTRODUODENOSCOPY (EGD) WITH PROPOFOL;  Surgeon: Wilfrid Lund  L III, MD;  Location: [place];  Service: Gastroenterology;  Laterality: N/A; . ESOPHAGOGASTRODUODENOSCOPY (EGD) WITH PROPOFOL N/A 04/21/2020  Procedure: ESOPHAGOGASTRODUODENOSCOPY (EGD) WITH PROPOFOL;  Surgeon: Rush Landmark Telford Nab., MD;  Location: [place];  Service: Gastroenterology;  Laterality: N/A; . ESOPHAGOGASTRODUODENOSCOPY (EGD) WITH PROPOFOL N/A 08/17/2020  Procedure: ESOPHAGOGASTRODUODENOSCOPY (EGD) WITH PROPOFOL;  Surgeon: Carol Ada, MD;  Location: [place];  Service: Endoscopy;  Laterality: N/A; . EUS N/A 04/21/2020  Procedure: UPPER ENDOSCOPIC ULTRASOUND (EUS) LINEAR;  Surgeon: Irving Copas., MD;  Location: [place];  Service: Gastroenterology;  Laterality: N/A; . HERNIA REPAIR  1990 . KNEE SURGERY  1975  left men. repair . KNEE SURGERY  1975 . REVERSE SHOULDER ARTHROPLASTY Right 06/21/2018  Procedure: REVERSE SHOULDER ARTHROPLASTY;  Surgeon: Hiram Gash, MD;  Location: [facility];  Service: Orthopedics;  Laterality: Right; . ROTATOR CUFF REPAIR    right . ROTATOR CUFF REPAIR   . TONSILLECTOMY   . TOTAL SHOULDER REPLACEMENT  2020 . UMBILICAL HERNIA REPAIR   HPI: Pt is a 65 yo male who presented on 5/14 with weakness, fatigue, and falls. Pt found to have Hgb 5.3, acute upper GI bleed. Upper endoscopy 5/15: Benign-appearing esophageal stenosis, non-bleeding gastric ulcers, duodenitis, non-bleeding duodenal ulcers. Clear liquid diet initiated with recommendation from GI on 5/17 to advance as tolerated. SLP consulted due to signs of aspiration with pills on 5/19 and c/o of odynophagia. PMH - ETOHabuse, chf, HTN, dislocated lt  shoulder, rt reverse total shoulder replacement.  No data recorded Assessment / Plan / Recommendation CHL IP CLINICAL IMPRESSIONS 08/22/2020 Clinical Impression Pt presented with oropharyngeal dysphagia characterized by intemittent oral holding, reduced bolus cohesion, a pharyngeal delay, mildly reduced lingual retraction, and reduced anterior laryngeal movement. He demonstrated premature spillage to the valleculae and pyriform sinuses, and mild vallecular and pyriform sinus residue. Pharyngeal residue was improved with a liquid wash. Pt exhibited intermittent penetration (PAS 3, 5) of individual sips of thin liquids with trace delayed aspiration (PAS 7). Immediate aspiration (PAS 7) was noted once with thin liquids via straw. Instances of aspiration consistently triggered coughing but this was ineffective in expelling aspirated material. No penetration/aspiration was noted with solids or liquids. It is anticipated that the pt's dyspahgia is at least partly cognitively based. A dysphagia 3 diet with nectar thick liquids will be initiated at this time. SLP will follow for dysphagia treatment and his prognosis for diet advancement is judged to be good at this time. SLP Visit Diagnosis Dysphagia, oropharyngeal phase (R13.12) Attention and concentration deficit following -- Frontal lobe and executive function deficit following -- Impact on safety and function Mild aspiration risk   CHL IP TREATMENT RECOMMENDATION 08/22/2020 Treatment Recommendations Therapy as outlined in treatment plan below   Prognosis 08/22/2020 Prognosis for Safe Diet Advancement Good Barriers to Reach Goals Cognitive deficits Barriers/Prognosis Comment -- CHL IP DIET RECOMMENDATION 08/22/2020 SLP Diet Recommendations Dysphagia 3 (Mech soft) solids;Nectar thick liquid Liquid Administration via Cup;Straw Medication Administration Whole meds with liquid Compensations Slow rate;Small sips/bites Postural Changes Seated upright at 90 degrees   CHL IP OTHER  RECOMMENDATIONS 08/22/2020 Recommended Consults -- Oral Care Recommendations Oral care BID;Staff/trained caregiver to provide oral care Other Recommendations --   CHL IP FOLLOW UP RECOMMENDATIONS 08/22/2020 Follow up Recommendations Skilled Nursing facility   [facility] IP FREQUENCY AND DURATION 08/22/2020 Speech Therapy Frequency (ACUTE ONLY) min 2x/week Treatment Duration 2 weeks      CHL IP ORAL PHASE 08/22/2020 Oral Phase Impaired Oral - Pudding Teaspoon -- Oral - Pudding  Cup -- Oral - Honey Teaspoon -- Oral - Honey Cup -- Oral - Nectar Teaspoon -- Oral - Nectar Cup Holding of bolus;Decreased bolus cohesion;Premature spillage Oral - Nectar Straw Holding of bolus;Decreased bolus cohesion;Premature spillage Oral - Thin Teaspoon -- Oral - Thin Cup Holding of bolus;Decreased bolus cohesion;Premature spillage Oral - Thin Straw Holding of bolus;Decreased bolus cohesion;Premature spillage Oral - Puree Holding of bolus;Premature spillage Oral - Mech Soft -- Oral - Regular Holding of bolus;Delayed oral transit Oral - Multi-Consistency -- Oral - Pill -- Oral Phase - Comment --  CHL IP PHARYNGEAL PHASE 08/22/2020 Pharyngeal Phase Impaired Pharyngeal- Pudding Teaspoon -- Pharyngeal -- Pharyngeal- Pudding Cup -- Pharyngeal -- Pharyngeal- Honey Teaspoon -- Pharyngeal -- Pharyngeal- Honey Cup -- Pharyngeal -- Pharyngeal- Nectar Teaspoon -- Pharyngeal -- Pharyngeal- Nectar Cup Reduced anterior laryngeal mobility;Delayed swallow initiation-pyriform sinuses;Pharyngeal residue - valleculae;Pharyngeal residue - pyriform;Reduced tongue base retraction Pharyngeal -- Pharyngeal- Nectar Straw Reduced anterior laryngeal mobility;Delayed swallow initiation-pyriform sinuses;Pharyngeal residue - valleculae;Pharyngeal residue - pyriform;Reduced tongue base retraction Pharyngeal -- Pharyngeal- Thin Teaspoon -- Pharyngeal -- Pharyngeal- Thin Cup Reduced anterior laryngeal mobility;Delayed swallow initiation-pyriform sinuses;Pharyngeal residue -  valleculae;Pharyngeal residue - pyriform;Reduced tongue base retraction;Penetration/Aspiration during swallow;Penetration/Apiration after swallow Pharyngeal Material enters airway, remains ABOVE vocal cords and not ejected out;Material enters airway, CONTACTS cords and not ejected out;Material enters airway, passes BELOW cords and not ejected out despite cough attempt by patient Pharyngeal- Thin Straw Reduced anterior laryngeal mobility;Delayed swallow initiation-pyriform sinuses;Pharyngeal residue - valleculae;Pharyngeal residue - pyriform;Reduced tongue base retraction;Penetration/Aspiration during swallow Pharyngeal Material enters airway, passes BELOW cords and not ejected out despite cough attempt by patient Pharyngeal- Puree Delayed swallow initiation-vallecula Pharyngeal -- Pharyngeal- Mechanical Soft -- Pharyngeal -- Pharyngeal- Regular Reduced anterior laryngeal mobility;Delayed swallow initiation-pyriform sinuses;Pharyngeal residue - valleculae;Pharyngeal residue - pyriform;Reduced tongue base retraction Pharyngeal -- Pharyngeal- Multi-consistency -- Pharyngeal -- Pharyngeal- Pill Reduced anterior laryngeal mobility;Delayed swallow initiation-pyriform sinuses;Pharyngeal residue - valleculae;Pharyngeal residue - pyriform;Reduced tongue base retraction Pharyngeal -- Pharyngeal Comment --  CHL IP CERVICAL ESOPHAGEAL PHASE 08/22/2020 Cervical Esophageal Phase WFL Pudding Teaspoon -- Pudding Cup -- Honey Teaspoon -- Honey Cup -- Nectar Teaspoon -- Nectar Cup -- Nectar Straw -- Thin Teaspoon -- Thin Cup -- Thin Straw -- Puree -- Mechanical Soft -- Regular -- Multi-consistency -- Pill -- Cervical Esophageal Comment -- Shanika I. Hardin Negus, [place], [place] Office number [phone] Pager [phone] Horton Marshall 08/22/2020, 2:14 PM                Flora Lipps, MD  [facility] 08/24/2020  If 7PM-7AM, please contact night-coverage

## 2020-08-24 NOTE — Progress Notes (Signed)
Pt was noted with low BP this shift x2. On call provider was notified about patient's condition. Obtained new IVF orders and STAT PCXR. See mar for order details.

## 2020-08-24 NOTE — Plan of Care (Signed)
  Problem: Bowel/Gastric: Goal: Will show no signs and symptoms of gastrointestinal bleeding Outcome: Progressing   Problem: Fluid Volume: Goal: Will show no signs and symptoms of excessive bleeding Outcome: Progressing   Problem: Clinical Measurements: Goal: Complications related to the disease process, condition or treatment will be avoided or minimized Outcome: Progressing   Problem: Health Behavior/Discharge Planning: Goal: Ability to manage health-related needs will improve Outcome: Progressing   Problem: Clinical Measurements: Goal: Ability to maintain clinical measurements within normal limits will improve Outcome: Progressing

## 2020-08-25 LAB — GLUCOSE, CAPILLARY
Glucose-Capillary: 106 mg/dL — ABNORMAL HIGH (ref 70–99)
Glucose-Capillary: 107 mg/dL — ABNORMAL HIGH (ref 70–99)
Glucose-Capillary: 123 mg/dL — ABNORMAL HIGH (ref 70–99)
Glucose-Capillary: 147 mg/dL — ABNORMAL HIGH (ref 70–99)

## 2020-08-25 LAB — COMPREHENSIVE METABOLIC PANEL
ALT: 44 U/L (ref 0–44)
AST: 26 U/L (ref 15–41)
Albumin: 2.8 g/dL — ABNORMAL LOW (ref 3.5–5.0)
Alkaline Phosphatase: 66 U/L (ref 38–126)
Anion gap: 10 (ref 5–15)
BUN: 17 mg/dL (ref 8–23)
CO2: 31 mmol/L (ref 22–32)
Calcium: 8.4 mg/dL — ABNORMAL LOW (ref 8.9–10.3)
Chloride: 98 mmol/L (ref 98–111)
Creatinine, Ser: 1.13 mg/dL (ref 0.61–1.24)
GFR, Estimated: >60 mL/min (ref >60)
Glucose, Bld: 89 mg/dL (ref 70–99)
Potassium: 3.6 mmol/L (ref 3.5–5.1)
Sodium: 139 mmol/L (ref 135–145)
Total Bilirubin: 1 mg/dL (ref 0.3–1.2)
Total Protein: 5.6 g/dL — ABNORMAL LOW (ref 6.5–8.1)

## 2020-08-25 LAB — PHOSPHORUS: Phosphorus: 4.2 mg/dL (ref 2.5–4.6)

## 2020-08-25 LAB — CBC
HCT: 27.1 % — ABNORMAL LOW (ref 39.0–52.0)
Hemoglobin: 8.9 g/dL — ABNORMAL LOW (ref 13.0–17.0)
MCH: 35.2 pg — ABNORMAL HIGH (ref 26.0–34.0)
MCHC: 32.8 g/dL (ref 30.0–36.0)
MCV: 107.1 fL — ABNORMAL HIGH (ref 80.0–100.0)
Platelets: 171 10*3/uL (ref 150–400)
RBC: 2.53 MIL/uL — ABNORMAL LOW (ref 4.22–5.81)
RDW: 18.2 % — ABNORMAL HIGH (ref 11.5–15.5)
WBC: 6.4 10*3/uL (ref 4.0–10.5)
nRBC: 0 % (ref 0.0–0.2)

## 2020-08-25 LAB — MAGNESIUM: Magnesium: 1.5 mg/dL — ABNORMAL LOW (ref 1.7–2.4)

## 2020-08-25 MED ORDER — DOCUSATE SODIUM 100 MG PO CAPS: 100.0000 mg | ORAL_CAPSULE | Freq: Two times a day (BID) | ORAL | 0 refills | Status: DC | PRN

## 2020-08-25 MED ORDER — LOSARTAN POTASSIUM 25 MG PO TABS: 12.5000 mg | ORAL_TABLET | Freq: Every day | ORAL | 11 refills | Status: DC

## 2020-08-25 MED ORDER — CARVEDILOL 3.125 MG PO TABS: 3.1250 mg | ORAL_TABLET | Freq: Two times a day (BID) | ORAL | Status: DC

## 2020-08-25 MED ORDER — NICOTINE 14 MG/24HR TD PT24: 14.0000 mg | MEDICATED_PATCH | Freq: Every day | TRANSDERMAL | 0 refills | Status: DC

## 2020-08-25 MED ORDER — THIAMINE HCL 100 MG PO TABS: 100.0000 mg | ORAL_TABLET | Freq: Every day | ORAL | 0 refills | Status: DC

## 2020-08-25 MED ORDER — PANTOPRAZOLE SODIUM 40 MG PO TBEC: DELAYED_RELEASE_TABLET | ORAL | Status: DC

## 2020-08-25 MED ORDER — GUAIFENESIN-DM 100-10 MG/5ML PO SYRP: 10.0000 mL | ORAL_SOLUTION | Freq: Four times a day (QID) | ORAL | 0 refills | Status: DC | PRN

## 2020-08-25 MED ORDER — MAGNESIUM OXIDE -MG SUPPLEMENT 400 (240 MG) MG PO TABS: 400.0000 mg | ORAL_TABLET | Freq: Two times a day (BID) | ORAL | Status: AC

## 2020-08-25 MED ORDER — CHLORDIAZEPOXIDE HCL 25 MG PO CAPS: ORAL_CAPSULE | ORAL | 0 refills | Status: DC

## 2020-08-25 NOTE — Progress Notes (Signed)
  Speech Language Pathology Treatment: Dysphagia  Patient Details Name: Fernando [name] MRN: [id number] DOB: 09/23/1955 Today's Date: 08/25/2020 Time: 4268-3419 SLP Time Calculation (min) (ACUTE ONLY): 24.82 min  Assessment / Plan / Recommendation Clinical Impression  Pt was seen for dysphagia treatment and he was cooperative during the session. Pt was adequately alert for p.o. intake, but his eyes were noted to close when stimulation was removed. Multiple cups of thin liquids were noted on tray table with straws in place. Current staff was unsure of when they arrived, but they were removed and his ginger ale thickened by this SLP. Pt was educated regarding the results of the modified barium swallow study, diet recommendations, and swallowing precautions. Video recording of the study was used to facilitate education and pt verbalized understanding regarding all areas of education. Pt continues to demonstrate signs of aspiration with thin liquids via cup even when reduced bolus sizes are used. No s/sx of aspiration were noted with nectar thick or dysphagia 3 solids. Mastication and oral clearance were adequate with regular textures, but pt reported that he prefers softer solids. Pt's current diet of dysphagia 3 solids and nectar thick liquids will be continued. Pt currently has discharge orders and he is in agreement with continuing dysphagia treatment after discharge.    HPI HPI: Pt is a 65 yo male who presented on 5/14 with weakness, fatigue, and falls. Pt found to have Hgb 5.3, acute upper GI bleed. Upper endoscopy 5/15: Benign-appearing esophageal stenosis, non-bleeding gastric ulcers, duodenitis, non-bleeding duodenal ulcers. Clear liquid diet initiated with recommendation from GI on 5/17 to advance as tolerated. SLP consulted due to signs of aspiration with pills on 5/19 and c/o of odynophagia. PMH - ETOHabuse, chf, HTN, dislocated lt shoulder, rt reverse total shoulder replacement.      SLP Plan   Continue with current plan of care       Recommendations  Diet recommendations: Dysphagia 3 (mechanical soft);Nectar-thick liquid Liquids provided via: Cup;Straw Medication Administration: Whole meds with puree Supervision: Patient able to self feed;Intermittent supervision to cue for compensatory strategies Compensations: Slow rate;Small sips/bites;Follow solids with liquid Postural Changes and/or Swallow Maneuvers: Seated upright 90 degrees                Oral Care Recommendations: Oral care BID Follow up Recommendations: Skilled Nursing facility SLP Visit Diagnosis: Dysphagia, oropharyngeal phase (R13.12) Plan: Continue with current plan of care       Delmore Sear I. Hardin Negus, [place], [place] Office number [phone] Pager [phone]                Horton Marshall 08/25/2020, 11:28 AM

## 2020-08-25 NOTE — Care Management Important Message (Signed)
Important Message  Patient Details  Name: Fernando [name] MRN: [id number] Date of Birth: 1955-09-27   Medicare Important Message Given:  Yes     [name] 08/25/2020, 12:03 PM

## 2020-08-25 NOTE — Progress Notes (Signed)
Physical Therapy Treatment Patient Details Name: Fernando [name] MRN: [id number] DOB: [birth year] Today's Date: 08/25/2020    History of Present Illness 65 yo male presented on 5/14 with weakness, fatigue, and falls. Pt found to have Hgb 5.3 and transfused. Pt with acute upper GI bleed. PMH - etoh abuse, chf, HTN, dislocated lt shoulder, rt reverse total shoulder replacement.    PT Comments    Pt making steady progress with mobility. Continue to recommend ST-SNF. Will plan to continue to work on balance and incr ambulation distance.    Follow Up Recommendations  SNF     Equipment Recommendations  None recommended by PT    Recommendations for Other Services       Precautions / Restrictions Precautions Precautions: Fall Precaution Comments: watch BP    Mobility  Bed Mobility               General bed mobility comments: Pt up in recliner    Transfers Overall transfer level: Needs assistance Equipment used: Rolling walker (2 wheeled) Transfers: Sit to/from Stand Sit to Stand: Min assist         General transfer comment: Assist for balance. Verbal cues for hand placement  Ambulation/Gait Ambulation/Gait assistance: Min assist;+2 safety/equipment Gait Distance (Feet): 75 Feet Assistive device: Rolling walker (2 wheeled) Gait Pattern/deviations: Decreased stride length;Wide base of support;Step-through pattern;Trunk flexed Gait velocity: decr Gait velocity interpretation: <1.31 ft/sec, indicative of household ambulator General Gait Details: Assist for balance.   Stairs             Wheelchair Mobility    Modified Rankin (Stroke Patients Only)       Balance Overall balance assessment: Needs assistance Sitting-balance support: Feet supported;No upper extremity supported Sitting balance-Leahy Scale: Fair     Standing balance support: Bilateral upper extremity supported Standing balance-Leahy Scale: Poor Standing balance comment: walker and min  guard for static standing                            Cognition Arousal/Alertness: Awake/alert Behavior During Therapy: Flat affect Overall Cognitive Status: Impaired/Different from baseline Area of Impairment: Awareness;Problem solving;Safety/judgement;Following commands;Memory;Attention;Orientation                 Orientation Level: Situation Current Attention Level: Selective Memory: Decreased short-term memory Following Commands: Follows one step commands consistently Safety/Judgement: Decreased awareness of safety Awareness: Intellectual Problem Solving: Slow processing;Requires verbal cues        Exercises      General Comments General comments (skin integrity, edema, etc.): SpO2 87% on RA with amb      Pertinent Vitals/Pain Pain Assessment: No/denies pain    Home Living                      Prior Function            PT Goals (current goals can now be found in the care plan section) Acute Rehab PT Goals Patient Stated Goal: none stated Progress towards PT goals: Progressing toward goals    Frequency    Min 2X/week      PT Plan Current plan remains appropriate;Frequency needs to be updated    Co-evaluation              AM-PAC PT "6 Clicks" Mobility   Outcome Measure  Help needed turning from your back to your side while in a flat bed without using bedrails?: A Little Help needed moving from  lying on your back to sitting on the side of a flat bed without using bedrails?: A Little Help needed moving to and from a bed to a chair (including a wheelchair)?: A Little Help needed standing up from a chair using your arms (e.g., wheelchair or bedside chair)?: A Little Help needed to walk in hospital room?: A Little Help needed climbing 3-5 steps with a railing? : A Lot 6 Click Score: 17    End of Session Equipment Utilized During Treatment: Gait belt Activity Tolerance: Patient limited by fatigue Patient left: with call  bell/phone within reach;in chair;with chair alarm set Nurse Communication: Mobility status PT Visit Diagnosis: Unsteadiness on feet (R26.81);Other abnormalities of gait and mobility (R26.89);Muscle weakness (generalized) (M62.81);History of falling (Z91.81)     Time: 8469-6295 PT Time Calculation (min) (ACUTE ONLY): 11 min  Charges:  $Gait Training: 8-22 mins                     [place] Pager [phone] Office [place] 08/25/2020, 12:58 PM

## 2020-08-25 NOTE — TOC Progression Note (Addendum)
Transition of Care [facility]) - Progression Note    Patient Details  Name: HAVARD RADIGAN MRN: [id number] Date of Birth: [birth year]  [facility] CM/SW Contact  Joanne Chars, LCSW Phone Number: 08/25/2020, 10:05 AM  Clinical Narrative:   CSW spoke with pt nieghbor Jocelyn Lamer and pt son [name] regarding facility choice.  They agree on [facility], CSW spoke with Helene Kelp at [facility] to confirm bed, she can receive pt today.  She does not need update covid test. MD informed.  1150: CSW contacted by PT who discovered pt is currently restrained.  There is an order from yesterday evening.  No note in epic but flowsheets document pt pulling at lines.  1230:  CSW spoke to [place] at [facility].  They cannot take pt until out of restraints 48 hours, CSW questioned this as usual standard is 24 hours, Helene Kelp to check on this. Chat with PT, RN, MD requesting if we can DC restraint order.  Neighbor Jocelyn Lamer and son [name] informed of delay in DC.    Expected Discharge Plan: [place] Barriers to Discharge: Continued Medical Work up  Expected Discharge Plan and Services Expected Discharge Plan: [place] In-house Referral: Clinical Social Work   Post Acute Care Choice: [place] Living arrangements for the past 2 months: Single Family Home                                       Social Determinants of Health (SDOH) Interventions    Readmission Risk Interventions No flowsheet data found.

## 2020-08-25 NOTE — Discharge Summary (Signed)
Physician Discharge Summary  Fernando [name] LFY:[id number] DOB: 1955/10/16 DOA: 08/16/2020  PCP: [name], MD  Admit date: 08/16/2020 Discharge date: 08/25/2020  Admitted From: Home  Discharge disposition: Skilled nursing facility  Recommendations for Outpatient Follow-Up:   . Follow up with your primary care provider at the skilled nursing facility in 3 to 5 days. . Check CBC, BMP, magnesium in the next visit . Patient will need to get MRI of the pancreas in 3 to 6 months to assess for pancreatic cyst. . Supplemental oxygen at 2 to 3 L/min, wean as possible.  Discharge Diagnosis:   Principal Problem:   Acute GI bleeding Active Problems:   Generalized weakness   Essential hypertension   Alcohol dependence (HCC)   Tobacco dependence   Alcoholic cirrhosis of liver without ascites ([place])   Discharge Condition: Improved.  Diet recommendation: Dysphagia 3 diet.  Wound care: None.  Code status: Full.   History of Present Illness:   Mr. Fernando [name] is a 65 year old male with past medical history of alcohol abuse, congestive heart failure ejection fraction of 55 to 60%, hypertension and chronic smoking presented to hospital with generalized weakness and fatigue with falls and exertional dyspnea.  Patient reported having black stools.  He does have history of chronic daily drinking.  In the ED, he was hypotensive and received 2 L of normal saline bolus.  Hemoglobin was 5.3 and stool occult was positive.  He was given blood transfusion and was admitted to the ICU for hypotension. Subsequently, his blood pressure improved.  Patient was then considered stable for transfer out of the ICU.  At this time, patient is extremely deconditioned and has been considered for skilled nursing facility placement.  Hospital Course:   Following conditions were addressed during hospitalization as listed below,  Acute blood Loss Anemia  secondary to upper GI bleed portal gastropathy and  gastric ulcers duodenitis.   Acute Upper GIB, portal gastropathy, gastric ulcers, duodenitis Patient got IV fluid resuscitation and PRBC transfusion.  GI was consulted due to GI bleed.   GI recommends PPI for next 12 weeks.  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  CA 19-9 was 14.  Patient has completed the prophylactic Rocephin.  Oral diet has been initiated.  Patient had EGD January 2022. Gastric biopsies showed nonspecific reactive gastropathy with erosion.  Patient was advised against NSAIDs.  Alcohol dependence with history of ETOH withdrawal with seizures Last drink was on 08/15/2020.  Received CIWA protocol.  No overt withdrawal symptoms.  Continue thiamine folic acid and Librium on discharge..    Acute exacerbation of COPD Received nebulizers and Solu-Medrol. Continue albuterol nebulizer Continue nicotine patch.  Chronic diastolic heart failure.  Currently compensated.  Essential hypertension. .  2D echocardiogram with left ventricular ejection fraction of 55 to 60%.  Received IV fluid hydration and PRBC during hospitalization.  We will continue to monitor closely.  Has history of congestive heart failure so we will continue losartan and Coreg with holding parameters.   Hyponatremia, Improved.    Latest sodium of 139 prior to discharge   Hyperkalemia Normalized at this time.  Latest potassium of 3.6  Hypomagnesemia. Continue magnesium oxide orally on discharge..  Magnesium of 1.5 today.  Debility, deconditioning.  Physical therapy recommends skilled nursing facility placement at this time.    Patient would highly benefit from rehabilitation on discharge.   Disposition.  At this time, patient is stable for disposition to skilled nursing facility today.  Medical Consultants:    GI  PCCM  Procedures:     PRBC transfusion  Subjective:   Today, patient was seen and  examined at bedside.  Feels okay.  No nausea vomiting, abdominal pain, fever or chills.  Denies any hallucinations, tremor, diaphoresis.  Discharge Exam:   Vitals:   08/25/20 0806 08/25/20 0820  BP:  (!) 96/48  Pulse:  94  Resp:    Temp:  98.6 F (37 C)  SpO2: 100% (!) 73%   Vitals:   08/25/20 0500 08/25/20 0525 08/25/20 0806 08/25/20 0820  BP:  95/66  (!) 96/48  Pulse:  97  94  Resp:  17    Temp:  98.1 F (36.7 C)  98.6 F (37 C)  TempSrc:  Oral  Oral  SpO2:  100% 100% (!) 73%  Weight: 78.5 kg     Height:       General: Alert awake, not in obvious distress, on nasal cannula oxygen HENT: pupils equally reacting to light,  No scleral pallor or icterus noted. Oral mucosa is moist.  Chest:   Diminished breath sounds bilaterally. No crackles or wheezes.  CVS: S1 &S2 heard. No murmur.  Regular rate and rhythm. Abdomen: Soft, nontender, nondistended.  Bowel sounds are heard.   Extremities: No cyanosis, clubbing or edema.  Peripheral pulses are palpable. Psych: Alert, awake and oriented, normal mood CNS:  No cranial nerve deficits.  Generalized weakness noted. Skin: Warm and dry.  No rashes noted.  The results of significant diagnostics from this hospitalization (including imaging, microbiology, ancillary and laboratory) are listed below for reference.     Diagnostic Studies:   CT Head Wo Contrast  Result Date: 08/16/2020 CLINICAL DATA:  Weakness with fall, no loss of consciousness. EXAM: CT HEAD WITHOUT CONTRAST TECHNIQUE: Contiguous axial images were obtained from the base of the skull through the vertex without intravenous contrast. COMPARISON:  January 26, 2020 FINDINGS: Brain: No evidence of acute large vascular territory infarction, hemorrhage, hydrocephalus, extra-axial collection or mass lesion/mass effect. Stable mild age related global parenchymal volume loss. Similar mild burden chronic small-vessel white matter ischemic change. 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 Vascular: No hyperdense vessel. Atherosclerotic calcifications of the internal carotid and vertebral arteries at the skull base. Skull: Stable round hyperdense 1.2 cm subcutaneous nodule posterior to the right occiput, likely sebaceous cyst. Negative for fracture or focal lesion. Sinuses/Orbits: Mucosal thickening of the right maxillary sinus is again visualized. Small left mastoid effusion. Other: Dental hardware. IMPRESSION: 1. No acute intracranial findings. 2. Stable mild age related global parenchymal volume loss and chronic small-vessel white matter ischemic change. 3. Small left mastoid effusion. Electronically Signed   By: Dahlia Bailiff MD   On: 08/16/2020 18:58   DG Chest Port 1 View  Result Date: 08/16/2020 CLINICAL DATA:  Fall, hypotension EXAM: PORTABLE CHEST 1 VIEW COMPARISON:  02/02/2020 FINDINGS: The heart size and mediastinal contours are within normal limits. Redemonstrated coarse, fibrotic opacity at the bilateral lung bases. No acute appearing airspace opacity. Status post right shoulder reverse total arthroplasty. IMPRESSION: Redemonstrated coarse, fibrotic opacity at the bilateral lung bases. No acute appearing airspace opacity. Electronically Signed   By: Eddie Candle M.D.   On: 08/16/2020 18:26   ECHOCARDIOGRAM COMPLETE  Result Date: 08/17/2020    ECHOCARDIOGRAM REPORT   Patient Name:   TRAMMELL BOWDEN Date of Exam: 08/17/2020 Medical Rec #:  [id number]      Height:       66.0 in Accession #:    [id number]     Weight:       163.4 lb Date of Birth:  1955-06-27      BSA:          1.835 m Patient Age:    82 years       BP:           81/52 mmHg Patient Gender: M              HR:           95 bpm. Exam Location:  Inpatient Procedure: 2D Echo, Cardiac Doppler, Color Doppler and Intracardiac            Opacification Agent Indications:    R06.02 SOB  History:        Patient has prior history of Echocardiogram examinations, most                 recent 01/29/2020. CHF.   Sonographer:    Merrie Roof Referring Phys: [id number] [place] A RICHARDS IMPRESSIONS  1. Left ventricular ejection fraction, by estimation, is 55 to 60%. The left ventricle has normal function. The left ventricle has no regional wall motion abnormalities. The left ventricular internal cavity size was mildly dilated. There is mild left ventricular hypertrophy. Left ventricular diastolic parameters were normal.  2. Right ventricular systolic function is normal. The right ventricular size is mildly enlarged. Tricuspid regurgitation signal is inadequate for assessing PA pressure.  3. Left atrial size was mildly dilated.  4. The mitral valve is normal in structure. No evidence of mitral valve regurgitation.  5. The aortic valve was not well visualized. Aortic valve regurgitation is not visualized. No aortic stenosis is present.  6. The inferior vena cava is normal in size with greater than 50% respiratory variability, suggesting right atrial pressure of 3 mmHg. Comparison(s): Compared to prior, significant improvement in LV systolic function. FINDINGS  Left Ventricle: Left ventricular ejection fraction, by estimation, is 55 to 60%. The left ventricle has normal function. The left ventricle has no regional wall motion abnormalities. The left ventricular internal cavity size was mildly dilated. There is  mild left ventricular hypertrophy. Left ventricular diastolic parameters were normal. Right Ventricle: The right ventricular size is mildly enlarged. No increase in right ventricular wall thickness. Right ventricular systolic function is normal. Tricuspid regurgitation signal is inadequate for assessing PA pressure. Left Atrium: Left atrial size was mildly dilated. Right Atrium: Right atrial size was not well visualized. Pericardium: Trivial pericardial effusion is present. Mitral Valve: The mitral valve is normal in structure. No evidence of mitral valve regurgitation. Tricuspid Valve: The tricuspid valve is not well  visualized. Tricuspid valve regurgitation is not demonstrated. Aortic Valve: The aortic valve was not well visualized. Aortic valve regurgitation is not visualized. No aortic stenosis is present. Aortic valve mean gradient measures 3.0 mmHg. Aortic valve peak gradient measures 6.2 mmHg. Aortic valve area, by VTI measures 3.34 cm. Pulmonic Valve: The pulmonic valve was not well visualized. Pulmonic valve regurgitation is not visualized. Aorta: The aortic root is normal in size and structure. Venous: The inferior vena cava is normal in size with greater than 50% respiratory variability, suggesting right atrial pressure of 3 mmHg. IAS/Shunts: The interatrial septum was not well visualized.  LEFT VENTRICLE PLAX 2D LVIDd:         5.30 cm  Diastology LVIDs:  4.40 cm  LV e' medial:    10.90 cm/s LV PW:         1.00 cm  LV E/e' medial:  8.6 LV IVS:        0.80 cm  LV e' lateral:   13.60 cm/s LVOT diam:     2.30 cm  LV E/e' lateral: 6.9 LV SV:         71 LV SV Index:   39 LVOT Area:     4.15 cm  RIGHT VENTRICLE             IVC RV Basal diam:  4.30 cm     IVC diam: 1.40 cm RV Mid diam:    4.10 cm RV S prime:     10.40 cm/s TAPSE (M-mode): 1.9 cm LEFT ATRIUM             Index       RIGHT ATRIUM           Index LA diam:        3.20 cm 1.74 cm/m  RA Area:     25.20 cm LA Vol (A2C):   76.3 ml 41.58 ml/m RA Volume:   90.40 ml  49.26 ml/m LA Vol (A4C):   52.7 ml 28.72 ml/m LA Biplane Vol: 64.7 ml 35.26 ml/m  AORTIC VALVE AV Area (Vmax):    3.38 cm AV Area (Vmean):   3.07 cm AV Area (VTI):     3.34 cm AV Vmax:           124.00 cm/s AV Vmean:          77.800 cm/s AV VTI:            0.213 m AV Peak Grad:      6.2 mmHg AV Mean Grad:      3.0 mmHg LVOT Vmax:         101.00 cm/s LVOT Vmean:        57.500 cm/s LVOT VTI:          0.171 m LVOT/AV VTI ratio: 0.80  AORTA Ao Root diam: 2.80 cm Ao Asc diam:  3.40 cm MITRAL VALVE MV Area (PHT): 5.50 cm     SHUNTS MV Decel Time: 138 msec     Systemic VTI:  0.17 m MV E velocity:  93.50 cm/s   Systemic Diam: 2.30 cm MV A velocity: 121.00 cm/s MV E/A ratio:  0.77 Oswaldo Milian MD Electronically signed by Oswaldo Milian MD Signature Date/Time: 08/17/2020/3:52:11 PM    Final      Labs:   Basic Metabolic Panel: Recent Labs  Lab 08/21/20 0342 08/22/20 [id number] 08/23/20 0322 08/24/20 0058 08/25/20 0330  NA 137 138 139 138 139  K 3.5 3.4* 3.6 3.8 3.6  CL 96* 100 96* 97* 98  CO2 33* 31 34* 33* 31  GLUCOSE 107* 95 93 110* 89  BUN <5* 5* 6* 18 17  CREATININE 0.75 0.80 0.86 1.23 1.13  CALCIUM 9.2 8.8* 9.1 8.3* 8.4*  MG  --   --  1.5* 1.5* 1.5*  PHOS  --   --   --  4.8* 4.2   GFR Estimated Creatinine Clearance: 62.5 mL/min (by C-G formula based on SCr of 1.13 mg/dL). Liver Function Tests: Recent Labs  Lab 08/25/20 0330  AST 26  ALT 44  ALKPHOS 66  BILITOT 1.0  PROT 5.6*  ALBUMIN 2.8*   No results for input(s): LIPASE, AMYLASE in the last 168 hours. No results for input(s):  AMMONIA in the last 168 hours. Coagulation profile No results for input(s): INR, PROTIME in the last 168 hours.  CBC: Recent Labs  Lab 08/20/20 0210 08/21/20 0342 08/23/20 0322 08/24/20 0058 08/25/20 0330  WBC 5.2 6.5 5.7 5.4 6.4  HGB 8.9* 9.7* 9.2* 8.2* 8.9*  HCT 26.3* 29.8* 28.5* 24.8* 27.1*  MCV 102.3* 106.0* 107.1* 106.9* 107.1*  PLT PLATELET CLUMPS NOTED ON SMEAR, UNABLE TO ESTIMATE 128* 176 168 171   Cardiac Enzymes: No results for input(s): CKTOTAL, CKMB, CKMBINDEX, TROPONINI in the last 168 hours. BNP: Invalid input(s): POCBNP CBG: Recent Labs  Lab 08/24/20 0639 08/24/20 0800 08/24/20 1622 08/25/20 0010 08/25/20 0609  GLUCAP 107* 144* 111* 106* 107*   D-Dimer No results for input(s): DDIMER in the last 72 hours. Hgb A1c No results for input(s): HGBA1C in the last 72 hours. Lipid Profile No results for input(s): CHOL, HDL, LDLCALC, TRIG, CHOLHDL, LDLDIRECT in the last 72 hours. Thyroid function studies No results for input(s): TSH, T4TOTAL,  T3FREE, THYROIDAB in the last 72 hours.  Invalid input(s): FREET3 Anemia work up No results for input(s): VITAMINB12, FOLATE, FERRITIN, TIBC, IRON, RETICCTPCT in the last 72 hours. Microbiology Recent Results (from the past 240 hour(s))  Resp Panel by RT-PCR (Flu A&B, Covid) Nasopharyngeal Swab     Status: None   Collection Time: 08/16/20  5:15 PM   Specimen: Nasopharyngeal Swab; Nasopharyngeal(NP) swabs in vial transport medium  Result Value Ref Range Status   SARS Coronavirus 2 by RT PCR NEGATIVE NEGATIVE Final    Comment: (NOTE) SARS-CoV-2 target nucleic acids are NOT DETECTED.  The SARS-CoV-2 RNA is generally detectable in upper respiratory specimens during the acute phase of infection. The lowest concentration of SARS-CoV-2 viral copies this assay can detect is 138 copies/mL. A negative result does not preclude SARS-Cov-2 infection and should not be used as the sole basis for treatment or other patient management decisions. A negative result may occur with  improper specimen collection/handling, submission of specimen other than nasopharyngeal swab, presence of viral mutation(s) within the areas targeted by this assay, and inadequate number of viral copies(<138 copies/mL). A negative result must be combined with clinical observations, patient history, and epidemiological information. The expected result is Negative.  Fact Sheet for Patients:  EntrepreneurPulse.com.au  Fact Sheet for Healthcare Providers:  IncredibleEmployment.be  This test is no t yet approved or cleared by the [place] FDA and  has been authorized for detection and/or diagnosis of SARS-CoV-2 by FDA under an Emergency Use Authorization (EUA). This EUA will remain  in effect (meaning this test can be used) for the duration of the COVID-19 declaration under Section 564(b)(1) of the Act, 21 U.S.C.section 360bbb-3(b)(1), unless the authorization is terminated  or  revoked sooner.       Influenza A by PCR NEGATIVE NEGATIVE Final   Influenza B by PCR NEGATIVE NEGATIVE Final    Comment: (NOTE) The Xpert Xpress SARS-CoV-2/FLU/RSV plus assay is intended as an aid in the diagnosis of influenza from Nasopharyngeal swab specimens and should not be used as a sole basis for treatment. Nasal washings and aspirates are unacceptable for Xpert Xpress SARS-CoV-2/FLU/RSV testing.  Fact Sheet for Patients: EntrepreneurPulse.com.au  Fact Sheet for Healthcare Providers: IncredibleEmployment.be  This test is not yet approved or cleared by the [place] FDA and has been authorized for detection and/or diagnosis of SARS-CoV-2 by FDA under an Emergency Use Authorization (EUA). This EUA will remain in effect (meaning this test can be used) for the duration of  the COVID-19 declaration under Section 564(b)(1) of the Act, 21 U.S.C. section 360bbb-3(b)(1), unless the authorization is terminated or revoked.  Performed at [facility], [place] [address], [place], [place] [address]   MRSA PCR Screening     Status: None   Collection Time: 08/16/20 11:18 PM   Specimen: Nasal Mucosa; Nasopharyngeal  Result Value Ref Range Status   MRSA by PCR NEGATIVE NEGATIVE Final    Comment:        The GeneXpert MRSA Assay (FDA approved for NASAL specimens only), is one component of a comprehensive MRSA colonization surveillance program. It is not intended to diagnose MRSA infection nor to guide or monitor treatment for MRSA infections. Performed at [facility], [place] [address], [place],  [address]      Discharge Instructions:   Discharge Instructions    Diet - low sodium heart healthy   Complete by: As directed    Dysphagia III diet with nectar thick   Discharge instructions   Complete by: As directed    Follow-up with your primary care provider at the skilled nursing facility in 3 to 5 days.  No alcohol.   Continue physical therapy.  Continue medications as prescribed.  You will need to get MRI of the pancreas in 3 to 6 months.  Discuss this with your primary care physician.   Increase activity slowly   Complete by: As directed      Allergies as of 08/25/2020      Reactions   Atorvastatin    Other reaction(s): joints ache   Doxycycline    Other reaction(s): rash/hive      Medication List    STOP taking these medications   famotidine 40 MG tablet Commonly known as: PEPCID   omeprazole 40 MG capsule Commonly known as: PRILOSEC     TAKE these medications   albuterol (2.5 MG/3ML) 0.083% nebulizer solution Commonly known as: PROVENTIL Take 3 mLs (2.5 mg total) by nebulization every 6 (six) hours as needed for wheezing.   allopurinol 100 MG tablet Commonly known as: ZYLOPRIM Take 200 mg by mouth daily.   carvedilol 3.125 MG tablet Commonly known as: COREG Take 1 tablet (3.125 mg total) by mouth 2 (two) times daily. Do not take is Systolic BP is [id number] What changed: additional instructions   chlordiazePOXIDE 25 MG capsule Commonly known as: LIBRIUM Take 1 capsule (25 mg total) by mouth 2 (two) times daily for 2 days, THEN 1 capsule (25 mg total) daily for 2 days. Start taking on: Aug 25, 2020   docusate sodium 100 MG capsule Commonly known as: COLACE Take 1 capsule (100 mg total) by mouth 2 (two) times daily as needed for mild constipation.   folic acid 1 MG tablet Commonly known as: FOLVITE Take 1 mg by mouth daily.   furosemide 40 MG tablet Commonly known as: Lasix Take 1 tablet (40 mg total) by mouth daily.   guaiFENesin-dextromethorphan 100-10 MG/5ML syrup Commonly known as: ROBITUSSIN DM Take 10 mLs by mouth every 6 (six) hours as needed for cough.   losartan 25 MG tablet Commonly known as: Cozaar Take 0.5 tablets (12.5 mg total) by mouth daily. DO not take if systolic blood pressure [id number] What changed: additional instructions   magnesium oxide 400 (240 Mg) MG  tablet Commonly known as: MAG-OX Take 1 tablet (400 mg total) by mouth 2 (two) times daily.   multivitamin tablet Take 1 tablet by mouth daily.   nicotine 14 mg/24hr patch Commonly  known as: NICODERM CQ - dosed in mg/24 hours Place 1 patch (14 mg total) onto the skin daily. Start taking on: Aug 26, 2020   pantoprazole 40 MG tablet Commonly known as: PROTONIX Take 1 tablet (40 mg total) by mouth 2 (two) times daily for 14 days, THEN 1 tablet (40 mg total) daily. Start taking on: Aug 25, 2020   thiamine 100 MG tablet Take 1 tablet (100 mg total) by mouth daily. Start taking on: Aug 26, 2020       Contact information for after-discharge care    Destination    [facility] .   Service: Skilled Nursing Contact information: [place] [phone]                   Time coordinating discharge: 39 minutes  Signed:  Berlynn Warsame  [facility] 08/25/2020, 11:12 AM

## 2020-08-26 DIAGNOSIS — I10 Essential (primary) hypertension: Secondary | ICD-10-CM | POA: Diagnosis not present

## 2020-08-26 DIAGNOSIS — R29898 Other symptoms and signs involving the musculoskeletal system: Secondary | ICD-10-CM

## 2020-08-26 DIAGNOSIS — F10230 Alcohol dependence with withdrawal, uncomplicated: Secondary | ICD-10-CM | POA: Diagnosis not present

## 2020-08-26 DIAGNOSIS — K7030 Alcoholic cirrhosis of liver without ascites: Secondary | ICD-10-CM | POA: Diagnosis not present

## 2020-08-26 DIAGNOSIS — K922 Gastrointestinal hemorrhage, unspecified: Secondary | ICD-10-CM | POA: Diagnosis not present

## 2020-08-26 LAB — HEMOGLOBIN AND HEMATOCRIT, BLOOD
HCT: 26.4 % — ABNORMAL LOW (ref 39.0–52.0)
Hemoglobin: 8.3 g/dL — ABNORMAL LOW (ref 13.0–17.0)

## 2020-08-26 LAB — GLUCOSE, CAPILLARY
Glucose-Capillary: 144 mg/dL — ABNORMAL HIGH (ref 70–99)
Glucose-Capillary: 114 mg/dL — ABNORMAL HIGH (ref 70–99)

## 2020-08-26 MED ORDER — SODIUM CHLORIDE 0.9 % IV BOLUS
1000.0000 mL | Freq: Once | INTRAVENOUS | Status: AC
  Administered 2020-08-26: 1000 mL via INTRAVENOUS

## 2020-08-26 MED ORDER — LORAZEPAM 1 MG PO TABS
1.0000 mg | ORAL_TABLET | Freq: Three times a day (TID) | ORAL | Status: DC
  Administered 2020-08-26 – 2020-08-29 (×8): 1 mg via ORAL
  Filled 2020-08-26 (×8): qty 1

## 2020-08-26 MED ORDER — BUDESONIDE 0.25 MG/2ML IN SUSP
0.2500 mg | Freq: Two times a day (BID) | RESPIRATORY_TRACT | Status: DC
  Administered 2020-08-26 – 2020-08-29 (×7): 0.25 mg via RESPIRATORY_TRACT
  Filled 2020-08-26 (×6): qty 2

## 2020-08-26 MED ORDER — SODIUM CHLORIDE 0.9 % IV BOLUS
500.0000 mL | Freq: Once | INTRAVENOUS | Status: AC
  Administered 2020-08-26: 500 mL via INTRAVENOUS

## 2020-08-26 NOTE — TOC Progression Note (Addendum)
Transition of Care [facility]) - Progression Note    Patient Details  Name: Fernando [name] MRN: [id number] Date of Birth: 02-10-1956  [facility] CM/SW Contact  Joanne Chars, LCSW Phone Number: 08/26/2020, 9:14 AM  Clinical Narrative:   [facility] filled the bed and cannot accept pt today.  May be Thursday before another bed is open.  CSW spoke with Jocelyn Lamer, her next choice would be [facility].  Helene Kelp will also review referral.  1000: CSW spoke with pt in his room.  Pt was on phone with son [name] at the time, [name] placed on speakerphone.  Pt stating he does not want to go to SNF, wants to go home, stating Jocelyn Lamer and [name] will help him.  [name] encouraging pt to go to SNF. Pt continues to insist he wants to go home.  1200: Phone call from son [name].  Pt is now agreeing to go to SNF. [facility] cannot take today.  [facility] reviewing.    Expected Discharge Plan: [place] Barriers to Discharge: Continued Medical Work up  Expected Discharge Plan and Services Expected Discharge Plan: [place] In-house Referral: Clinical Social Work   Post Acute Care Choice: [place] Living arrangements for the past 2 months: Single Family Home Expected Discharge Date: 08/25/20                                     Social Determinants of Health (SDOH) Interventions    Readmission Risk Interventions No flowsheet data found.

## 2020-08-26 NOTE — Progress Notes (Signed)
PROGRESS NOTE  Fernando [name]:[id number] DOB: 1955-12-21 DOA: 08/16/2020 PCP: [name], MD   LOS: 10 days   Brief narrative: Fernando [name] is a 65 year old male with past medical history of alcohol abuse, congestive heart failure ejection fraction of 55 to 60%, hypertension and chronic smoking presented to hospital with generalized weakness and fatigue with falls and exertional dyspnea.  Patient reported having black stools.  He does have history of chronic daily drinking.  In the ED, he was hypotensive and received 2 L of normal saline bolus.  Hemoglobin was 5.3 and stool occult was positive.  He was given blood transfusion and was admitted to the ICU for hypotension.  Subsequently, his blood pressure improved.  Patient was then considered stable for transfer out of the ICU.  At this time, patient is extremely deconditioned and has been considered for skilled nursing facility placement.  Patient is not experiencing any active withdrawal symptoms.  Assessment/Plan:  Principal Problem:   Acute GI bleeding Active Problems:   Generalized weakness   Essential hypertension   Alcohol dependence (HCC)   Tobacco dependence   Alcoholic cirrhosis of liver without ascites (HCC)  Acute blood loss anemia  secondary to upper GI bleed portal gastropathy and gastric ulcers duodenitis.   Acute Upper GIB, portal gastropathy, gastric ulcers, duodenitis Patient got IV fluid resuscitation and PRBC transfusion.  GI was consulted due to GI bleed.  Continue Protonix.  GI recommends PPI for next 12 weeks.  Patient does have history of pancreatic cyst and will need pancreatic CT protocol to assess for his pancreatic cyst. CA 19-9 was 14.  Patient has completed the prophylactic Rocephin.  Oral diet has been initiated.  Patient had EGD January 2022. Gastric biopsies showed nonspecific reactive gastropathy with erosion.  Patient was advised against NSAIDs. CBC Latest Ref Rng & Units 08/25/2020 08/24/2020  08/23/2020  WBC 4.0 - 10.5 K/uL 6.4 5.4 5.7  Hemoglobin 13.0 - 17.0 g/dL 8.9(L) 8.2(L) 9.2(L)  Hematocrit 39.0 - 52.0 % 27.1(L) 24.8(L) 28.5(L)  Platelets 150 - 400 K/uL 171 168 176    Alcohol dependence with history of ETOH withdrawal with seizures Last drink was on 08/15/2020.  on CIWA protocol.  No overt withdrawal symptoms.continue. Continue thiamine folic acid and Librium as needed Ativan.  Patient was mildly agitated yesterday but is not on any restraints or one-to-one this time.  Acute exacerbation of COPD with acute hypoxic respiratory failure. Continue Brovana and yupelri nebs.  Has completed Solu-Medrol.  Continue albuterol nebulizer as needed.  Continue nicotine patch.  Chest x-ray is showing basal atelectasis.  Need oxygen on discharge.  Chronic diastolic heart failure.  Currently compensated.  Could consider Lasix if the blood pressure improved.  Essential hypertension.  2D echocardiogram with left ventricular ejection fraction of 55 to 60%.  Received IV fluid hydration and PRBC during hospitalization. losartan and Coreg on hold at this time due to low blood pressure.  Could resume as tolerated.  He was on Coreg 3.25 mg twice a day and losartan 12.5 mg today.   Hyponatremia, Improved.  Sodium of 139.   Hyperkalemia Normalized at this time.  Latest potassium of 3.6.  Hypomagnesemia.  Latest magnesium of 1.5.  Continue with magnesium oxide  Debility, deconditioning.  Physical therapy recommends skilled nursing facility placement at this time.    DVT prophylaxis: SCDs Start: 08/16/20 2028  Code Status: Full code  Family Communication:  None today.    Status is: Inpatient  Remains inpatient  appropriate because:IV treatments appropriate due to intensity of illness or inability to take PO, Inpatient level of care appropriate due to severity of illness and Debility deconditioning, CIWA protocol, need for rehabilitation   Dispo: The patient is from: Home               Anticipated d/c is to:  PT recommended skilled nursing facility.  Discharge was planned on 08/25/2020 but due to recent restraints it was canceled.              Patient currently is medically stable to d/c.   Difficult to place patient No  Consultants:  GI  PCCM  Procedures:  PRBC transfusion  Anti-infectives:  Marland Kitchen Rocephin IV 5/14>5/20  Anti-infectives (From admission, onward)   Start     Dose/Rate Route Frequency Ordered Stop   08/16/20 2200  cefTRIAXone (ROCEPHIN) 2 g in sodium chloride 0.9 % 100 mL IVPB        2 g 200 mL/hr over 30 Minutes Intravenous Every 24 hours 08/16/20 2108 08/22/20 1951     Subjective: Today, patient was seen and examined at bedside.  Complains of generalized weakness.  Denies any nausea, vomiting abdominal pain dizziness lightheadedness.  Denies any hallucinations.  Objective: Vitals:   08/26/20 0756 08/26/20 0801  BP:  (!) 140/57  Pulse:  (!) 104  Resp:  12  Temp:  98.2 F (36.8 C)  SpO2: 95% 98%    Intake/Output Summary (Last 24 hours) at 08/26/2020 1046 Last data filed at 08/26/2020 0749 Gross per 24 hour  Intake --  Output 900 ml  Net -900 ml   Filed Weights   08/21/20 0607 08/25/20 0500 08/26/20 0500  Weight: 24.4 kg 78.5 kg 80 kg   Body mass index is 28.47 kg/m.   Physical Exam:  General:  Average built, not in obvious distress, on nasal cannula oxygen HENT:   No scleral pallor or icterus noted. Oral mucosa is moist.  Chest: Coarse breath sounds noted.  Diminished breath sounds bilaterally. No crackles or wheezes.  CVS: S1 &S2 heard. No murmur.  Regular rate and rhythm. Abdomen: Soft, nontender, nondistended.  Bowel sounds are heard.   Extremities: No cyanosis, clubbing or edema.  Peripheral pulses are palpable.  Mild tremors noted Psych: Alert, awake and oriented, normal mood CNS:  No cranial nerve deficits.  Power equal in all extremities.   Skin: Warm and dry.    Data Review: I have personally reviewed the following  laboratory data and studies,  CBC: Recent Labs  Lab 08/20/20 0210 08/21/20 0342 08/23/20 0322 08/24/20 0058 08/25/20 0330  WBC 5.2 6.5 5.7 5.4 6.4  HGB 8.9* 9.7* 9.2* 8.2* 8.9*  HCT 26.3* 29.8* 28.5* 24.8* 27.1*  MCV 102.3* 106.0* 107.1* 106.9* 107.1*  PLT PLATELET CLUMPS NOTED ON SMEAR, UNABLE TO ESTIMATE 128* 176 168 034   Basic Metabolic Panel: Recent Labs  Lab 08/21/20 0342 08/22/20 0637 08/23/20 0322 08/24/20 0058 08/25/20 0330  NA 137 138 139 138 139  K 3.5 3.4* 3.6 3.8 3.6  CL 96* 100 96* 97* 98  CO2 33* 31 34* 33* 31  GLUCOSE 107* 95 93 110* 89  BUN <5* 5* 6* 18 17  CREATININE 0.75 0.80 0.86 1.23 1.13  CALCIUM 9.2 8.8* 9.1 8.3* 8.4*  MG  --   --  1.5* 1.5* 1.5*  PHOS  --   --   --  4.8* 4.2   Liver Function Tests: Recent Labs  Lab 08/25/20 0330  AST 26  ALT 44  ALKPHOS 66  BILITOT 1.0  PROT 5.6*  ALBUMIN 2.8*   No results for input(s): LIPASE, AMYLASE in the last 168 hours. No results for input(s): AMMONIA in the last 168 hours. Cardiac Enzymes: No results for input(s): CKTOTAL, CKMB, CKMBINDEX, TROPONINI in the last 168 hours. BNP (last 3 results) Recent Labs    10/25/19 1240 08/19/20 0155 08/22/20 2134  BNP 349.4* 449.3* 488.2*    ProBNP (last 3 results) No results for input(s): PROBNP in the last 8760 hours.  CBG: Recent Labs  Lab 08/24/20 1622 08/25/20 0010 08/25/20 0609 08/25/20 1221 08/25/20 2338  GLUCAP 111* 106* 107* 147* 123*   Recent Results (from the past 240 hour(s))  Resp Panel by RT-PCR (Flu A&B, Covid) Nasopharyngeal Swab     Status: None   Collection Time: 08/16/20  5:15 PM   Specimen: Nasopharyngeal Swab; Nasopharyngeal(NP) swabs in vial transport medium  Result Value Ref Range Status   SARS Coronavirus 2 by RT PCR NEGATIVE NEGATIVE Final    Comment: (NOTE) SARS-CoV-2 target nucleic acids are NOT DETECTED.  The SARS-CoV-2 RNA is generally detectable in upper respiratory specimens during the acute phase of  infection. The lowest concentration of SARS-CoV-2 viral copies this assay can detect is 138 copies/mL. A negative result does not preclude SARS-Cov-2 infection and should not be used as the sole basis for treatment or other patient management decisions. A negative result may occur with  improper specimen collection/handling, submission of specimen other than nasopharyngeal swab, presence of viral mutation(s) within the areas targeted by this assay, and inadequate number of viral copies(<138 copies/mL). A negative result must be combined with clinical observations, patient history, and epidemiological information. The expected result is Negative.  Fact Sheet for Patients:  EntrepreneurPulse.com.au  Fact Sheet for Healthcare Providers:  IncredibleEmployment.be  This test is no t yet approved or cleared by the [place] FDA and  has been authorized for detection and/or diagnosis of SARS-CoV-2 by FDA under an Emergency Use Authorization (EUA). This EUA will remain  in effect (meaning this test can be used) for the duration of the COVID-19 declaration under Section 564(b)(1) of the Act, 21 U.S.C.section 360bbb-3(b)(1), unless the authorization is terminated  or revoked sooner.       Influenza A by PCR NEGATIVE NEGATIVE Final   Influenza B by PCR NEGATIVE NEGATIVE Final    Comment: (NOTE) The Xpert Xpress SARS-CoV-2/FLU/RSV plus assay is intended as an aid in the diagnosis of influenza from Nasopharyngeal swab specimens and should not be used as a sole basis for treatment. Nasal washings and aspirates are unacceptable for Xpert Xpress SARS-CoV-2/FLU/RSV testing.  Fact Sheet for Patients: EntrepreneurPulse.com.au  Fact Sheet for Healthcare Providers: IncredibleEmployment.be  This test is not yet approved or cleared by the [place] FDA and has been authorized for detection and/or diagnosis of SARS-CoV-2  by FDA under an Emergency Use Authorization (EUA). This EUA will remain in effect (meaning this test can be used) for the duration of the COVID-19 declaration under Section 564(b)(1) of the Act, 21 U.S.C. section 360bbb-3(b)(1), unless the authorization is terminated or revoked.  Performed at [facility], [place] [address] [place], [place] [address]   MRSA PCR Screening     Status: None   Collection Time: 08/16/20 11:18 PM   Specimen: Nasal Mucosa; Nasopharyngeal  Result Value Ref Range Status   MRSA by PCR NEGATIVE NEGATIVE Final    Comment:        The GeneXpert MRSA Assay (  FDA approved for NASAL specimens only), is one component of a comprehensive MRSA colonization surveillance program. It is not intended to diagnose MRSA infection nor to guide or monitor treatment for MRSA infections. Performed at [facility], [place] [address], [place], [place] [address]      Studies: No results found.   Flora Lipps, MD  [facility] 08/26/2020  If 7PM-7AM, please contact night-coverage

## 2020-08-26 NOTE — Progress Notes (Signed)
Occupational Therapy Treatment Patient Details Name: Fernando [name] MRN: [id number] DOB: 08/19/1955 Today's Date: 08/26/2020    History of present illness 65 yo male presented on 5/14 with weakness, fatigue, and falls. Pt found to have Hgb 5.3 and transfused. Pt with acute upper GI bleed. PMH - etoh abuse, chf, HTN, dislocated lt shoulder, rt reverse total shoulder replacement.   OT comments  Patient making slow progress toward goals. Session limited secondary to symptomatic hypotension with SBP in 70's and DBP in 40's. RN made aware. Treatment session with focus on BUE strengthening, seated grooming tasks, and functional transfers. OT will continue to follow acutely.    Follow Up Recommendations  SNF;Supervision/Assistance - 24 hour    Equipment Recommendations  None recommended by OT    Recommendations for Other Services      Precautions / Restrictions Precautions Precautions: Fall Precaution Comments: watch BP Restrictions Weight Bearing Restrictions: No       Mobility Bed Mobility Overal bed mobility: Needs Assistance Bed Mobility: Sit to Supine       Sit to supine: Supervision   General bed mobility comments: Supervision A for safety/line management.    Transfers Overall transfer level: Needs assistance Equipment used: Rolling walker (2 wheeled) Transfers: Sit to/from Stand Sit to Stand: Min assist         General transfer comment: Min A for steadying/balance. Limited 2/2 symptomatic hypotension.    Balance Overall balance assessment: Needs assistance Sitting-balance support: Feet supported;No upper extremity supported Sitting balance-Leahy Scale: Fair     Standing balance support: Bilateral upper extremity supported Standing balance-Leahy Scale: Poor                             ADL either performed or assessed with clinical judgement   ADL Overall ADL's : Needs assistance/impaired     Grooming: Set up;Sitting Grooming Details  (indicate cue type and reason): Unable to perform grooming tasks standing at sink level 2/2 symptomatic hypotension.                 Toilet Transfer: Minimal Designer, jewellery Details (indicate cue type and reason): Simulated with transfer to EOB with use of RW and light Min A.                 Vision       Perception     Praxis      Cognition Arousal/Alertness: Awake/alert Behavior During Therapy: Flat affect Overall Cognitive Status: Impaired/Different from baseline Area of Impairment: Awareness;Problem solving;Safety/judgement;Following commands;Memory;Attention;Orientation                 Orientation Level: Situation Current Attention Level: Selective Memory: Decreased short-term memory Following Commands: Follows one step commands consistently Safety/Judgement: Decreased awareness of safety Awareness: Intellectual Problem Solving: Slow processing;Requires verbal cues General Comments: Decreased safety awareness and decreased knowledge of current deficits.        Exercises Exercises: General Upper Extremity;General Lower Extremity General Exercises - Upper Extremity Shoulder Flexion: AROM;Left;10 reps;Seated Elbow Flexion: AROM;Both;10 reps;Seated Elbow Extension: AROM;Both;10 reps;Seated General Exercises - Lower Extremity Ankle Circles/Pumps: AROM;Both;10 reps;Seated Straight Leg Raises: AROM;Both;10 reps;Seated   Shoulder Instructions       General Comments BP 77/42 upon entry with patient seated in recliner without LE elevated. Patient reporting dizziness, BLE elevated. After ankle pumps and seated ADLs BP 72/49. RN made aware. Patient returned to supine.    Pertinent Vitals/ Pain  Pain Assessment: No/denies pain  Home Living                                          Prior Functioning/Environment              Frequency  Min 2X/week        Progress Toward Goals  OT Goals(current  goals can now be found in the care plan section)  Progress towards OT goals: Progressing toward goals  Acute Rehab OT Goals Patient Stated Goal: none stated OT Goal Formulation: With patient Time For Goal Achievement: 09/01/20 Potential to Achieve Goals: Good ADL Goals Pt Will Perform Grooming: with supervision;standing Pt Will Perform Upper Body Dressing: with set-up;sitting Pt Will Perform Lower Body Dressing: with supervision;sit to/from stand Pt Will Transfer to Toilet: with supervision;ambulating Pt Will Perform Toileting - Clothing Manipulation and hygiene: with supervision;sit to/from stand Pt Will Perform Tub/Shower Transfer: Shower transfer;with supervision;ambulating  Plan Discharge plan remains appropriate;Frequency remains appropriate    Co-evaluation                 AM-PAC OT "6 Clicks" Daily Activity     Outcome Measure   Help from another person eating meals?: None Help from another person taking care of personal grooming?: A Little Help from another person toileting, which includes using toliet, bedpan, or urinal?: A Little Help from another person bathing (including washing, rinsing, drying)?: A Lot Help from another person to put on and taking off regular upper body clothing?: A Little Help from another person to put on and taking off regular lower body clothing?: A Lot 6 Click Score: 17    End of Session Equipment Utilized During Treatment: Gait belt;Rolling walker;Oxygen  OT Visit Diagnosis: Unsteadiness on feet (R26.81);Other abnormalities of gait and mobility (R26.89);Muscle weakness (generalized) (M62.81);Other symptoms and signs involving cognitive function   Activity Tolerance Patient tolerated treatment well   Patient Left in bed;with call bell/phone within reach;with bed alarm set   Nurse Communication Mobility status;Other (comment) (Symptomatic hypotension)        Time: 1497-0263 OT Time Calculation (min): 29 min  Charges: OT General  Charges $OT Visit: 1 Visit OT Treatments $Self Care/Home Management : 8-22 mins $Therapeutic Exercise: 8-22 mins  Fernando [name] H. OTR/L Supplemental OT, Department of rehab services [phone]   Fernando [name] R H. 08/26/2020, 2:24 PM

## 2020-08-26 NOTE — Plan of Care (Signed)
  Problem: Education: Goal: Knowledge of General Education information will improve Description: Including pain rating scale, medication(s)/side effects and non-pharmacologic comfort measures Outcome: Progressing   Problem: Health Behavior/Discharge Planning: Goal: Ability to manage health-related needs will improve Outcome: Progressing   Problem: Clinical Measurements: Goal: Respiratory complications will improve Outcome: Progressing   Problem: Activity: Goal: Risk for activity intolerance will decrease Outcome: Progressing   Problem: Nutrition: Goal: Adequate nutrition will be maintained Outcome: Progressing   Problem: Coping: Goal: Level of anxiety will decrease Outcome: Progressing   Problem: Safety: Goal: Ability to remain free from injury will improve Outcome: Progressing   Problem: Skin Integrity: Goal: Risk for impaired skin integrity will decrease Outcome: Progressing   Problem: Education: Goal: Ability to identify signs and symptoms of gastrointestinal bleeding will improve Outcome: Completed/Met   Problem: Bowel/Gastric: Goal: Will show no signs and symptoms of gastrointestinal bleeding Outcome: Completed/Met   Problem: Fluid Volume: Goal: Will show no signs and symptoms of excessive bleeding Outcome: Completed/Met   Problem: Clinical Measurements: Goal: Complications related to the disease process, condition or treatment will be avoided or minimized Outcome: Completed/Met   Problem: Safety: Goal: Non-violent Restraint(s) Outcome: Completed/Met

## 2020-08-26 NOTE — Plan of Care (Signed)
  Problem: Safety: Goal: Non-violent Restraint(s) Outcome: Progressing   Problem: Nutrition: Goal: Adequate nutrition will be maintained Outcome: Progressing   Problem: Coping: Goal: Level of anxiety will decrease Outcome: Progressing   Problem: Elimination: Goal: Will not experience complications related to bowel motility Outcome: Progressing   Problem: Safety: Goal: Ability to remain free from injury will improve Outcome: Progressing   Problem: Skin Integrity: Goal: Risk for impaired skin integrity will decrease Outcome: Progressing

## 2020-08-26 NOTE — Progress Notes (Signed)
RT NOTES: Pt's sats 87% upon entering room on 3lpm nasal cannula. Pulse ox probe on toe. Moved pulse ox probe to left finger. Sats remain the same. Increased o2 to 5lpm. Sats now 95%. Will continue to monitor.

## 2020-08-26 NOTE — Progress Notes (Signed)
Two separate episodes of hypotension w/ lightheadedness and paleness during shift.  Around 1100 BP 81/40 w/ above symptoms. Legs elevated, head laid back in chair, and Dr. Louanne Belton notified. 531mL NS bolus given w/ increase in BP 99/50.  Called to beside by OT at 1345 for BP 77/42 w/ same symptoms. Dr. Louanne Belton paged again. 1069mL NS bolus currently infusing. Manual BP 92/40 after returning to bed.   Will continue to closely monitor hypotension and need for further intervention.

## 2020-08-26 NOTE — Progress Notes (Signed)
RT NOTES: Sats now 100%. Decreased o2 back to 3 lpm. Please see previous note.

## 2020-08-27 DIAGNOSIS — I10 Essential (primary) hypertension: Secondary | ICD-10-CM

## 2020-08-27 DIAGNOSIS — K7030 Alcoholic cirrhosis of liver without ascites: Secondary | ICD-10-CM | POA: Diagnosis not present

## 2020-08-27 DIAGNOSIS — F10230 Alcohol dependence with withdrawal, uncomplicated: Secondary | ICD-10-CM

## 2020-08-27 DIAGNOSIS — K922 Gastrointestinal hemorrhage, unspecified: Secondary | ICD-10-CM | POA: Diagnosis not present

## 2020-08-27 DIAGNOSIS — R531 Weakness: Secondary | ICD-10-CM

## 2020-08-27 DIAGNOSIS — F17200 Nicotine dependence, unspecified, uncomplicated: Secondary | ICD-10-CM

## 2020-08-27 LAB — CBC
HCT: 24.9 % — ABNORMAL LOW (ref 39.0–52.0)
Hemoglobin: 8.1 g/dL — ABNORMAL LOW (ref 13.0–17.0)
MCH: 35.5 pg — ABNORMAL HIGH (ref 26.0–34.0)
MCHC: 32.5 g/dL (ref 30.0–36.0)
MCV: 109.2 fL — ABNORMAL HIGH (ref 80.0–100.0)
Platelets: 162 10*3/uL (ref 150–400)
RBC: 2.28 MIL/uL — ABNORMAL LOW (ref 4.22–5.81)
RDW: 18.7 % — ABNORMAL HIGH (ref 11.5–15.5)
WBC: 5.6 10*3/uL (ref 4.0–10.5)
nRBC: 0 % (ref 0.0–0.2)

## 2020-08-27 LAB — BASIC METABOLIC PANEL
Anion gap: 8 (ref 5–15)
BUN: 9 mg/dL (ref 8–23)
CO2: 29 mmol/L (ref 22–32)
Calcium: 8.7 mg/dL — ABNORMAL LOW (ref 8.9–10.3)
Chloride: 102 mmol/L (ref 98–111)
Creatinine, Ser: 0.85 mg/dL (ref 0.61–1.24)
GFR, Estimated: >60 mL/min (ref >60)
Glucose, Bld: 86 mg/dL (ref 70–99)
Potassium: 3.7 mmol/L (ref 3.5–5.1)
Sodium: 139 mmol/L (ref 135–145)

## 2020-08-27 LAB — MAGNESIUM: Magnesium: 1.6 mg/dL — ABNORMAL LOW (ref 1.7–2.4)

## 2020-08-27 LAB — GLUCOSE, CAPILLARY
Glucose-Capillary: 106 mg/dL — ABNORMAL HIGH (ref 70–99)
Glucose-Capillary: 120 mg/dL — ABNORMAL HIGH (ref 70–99)
Glucose-Capillary: 121 mg/dL — ABNORMAL HIGH (ref 70–99)

## 2020-08-27 NOTE — TOC Progression Note (Addendum)
Transition of Care [facility]) - Progression Note    Patient Details  Name: Fernando [name] MRN: [id number] Date of Birth: [birth year]  Transition of Care [facility]) CM/SW [place], [place] Phone Number: 08/27/2020, 10:09 AM  Clinical Narrative:     CSW contacted [facility] to double check if a bed opened today; CSW is notified they may not have a bed until they have discharges this weekend.   CSW contacted [facility] and [facility] for bed availability; awaiting responses.   1024: [facility] cannot accept pt           [facility] still reviewing 1[facility]: [facility] will review but won't have a bed until after 5pm tomorrow  Expected Discharge Plan: [place] Barriers to Discharge: Continued Medical Work up  Expected Discharge Plan and Services Expected Discharge Plan: [place] In-house Referral: Clinical Social Work   Post Acute Care Choice: [place] Living arrangements for the past 2 months: Single Family Home Expected Discharge Date: 08/27/20                                     Social Determinants of Health (SDOH) Interventions    Readmission Risk Interventions No flowsheet data found.

## 2020-08-27 NOTE — Care Management Important Message (Signed)
Important Message  Patient Details  Name: Fernando [name] MRN: [id number] Date of Birth: 1955/12/20   Medicare Important Message Given:  Yes     Orbie Pyo 08/27/2020, 2:12 PM

## 2020-08-27 NOTE — Progress Notes (Signed)
PROGRESS NOTE  Fernando [name]  XJO:[id number] DOB: [birth year] DOA: 08/16/2020 PCP: [name], MD   Brief Narrative: Fernando [name] is a 65 y.o. male with a history of alcohol abuse, chronic HFpEF, HTN, and tobacco use who presented to the ED 5/14 with frequent falls and exertional dyspnea with melenotic stools. He was hypotensive with hgb 5.3g/dl and +FOBT, subsequently admitted to ICU, given 2u PRBCs with improvement in hypotension. EGD 5/15 revealed non-bleeding gactric ulcers, portal hypertensive gastropathy, and duodenitis. The patient had evidence of alcohol withdrawal during admission for which CIWA protocol with librium taper was initiated, as well as COPD exacerbation for which steroids were started with bronchodilators   Assessment & Plan: Principal Problem:   Acute GI bleeding Active Problems:   Generalized weakness   Essential hypertension   Alcohol dependence ([place])   Tobacco dependence   Alcoholic cirrhosis of liver without ascites (HCC)  Acute blood loss anemia  secondary to upper GI bleed portal gastropathy, gastric ulcers, duodenitis: Seen at EGD 5/15. Had negative H. pylori Jan 2022.  - Continue protonix BID for 12 weeks at which time EGD should be repeated to document healing.  - Abstain from alcohol as below.  - Trend hgb, has remained stable since 2u PRBCs.  Pancreatic pseudocyst: CA 19-9 was 14.  - GI to arrange pancreatic protocol CT as outpatient.   Alcohol abuse, dependence, and withdrawal:  - Pt now 10 days into admission, low risk for worsening withdrawals. - Continue thiamine folic acid  Acute hypoxic respiratory failure due to acute exacerbation of COPD, likely also a component of atelectasis based on CXR: - Has completed IV steroids, wheezing resolved. Wean oxygen as tolerated. If unable to to do so, would check CXR and consider diuresis.  - Continue brovana and yupelri, prn albuterol. - Incentive spirometry, OOB  Tobacco use:  - Cessation  counseling - Nicotine patch.   Thrombocytopenia: Resolved. No splenomegaly on imaging.  - Cirrhosis work up as outpatient is recommended.   Chronic HFpEF: LVEF 55-60%.  - No need for diuretic at this time.   HTN:  - Holding losartan, coreg due to hypotension. Note only NSR and sinus tachycardia on telemetry personally reviewed this morning. To facilitate mobility will DC telemetry monitoring.  Hyponatremia: Improved  Hyperkalemia: Normalized   Hypomagnesemia: Supplementing  Debility, deconditioning.  Physical therapy recommends skilled nursing facility placement which is being pursued.   DVT prophylaxis: SCDs Code Status: Full Family Communication: None at bedside Disposition Plan:  Status is: Inpatient  Remains inpatient appropriate because:Unsafe d/c plan   Dispo: The patient is from: Home              Anticipated d/c is to: SNF              Patient currently is medically stable to d/c.   Difficult to place patient No  Consultants:   GI  PCCM  Procedures:   None  Antimicrobials:  Ceftriaxone 5/14 - 5/20  Subjective: Complains of not being allowed to mobilize as much as he prefers. No bleeding, bruising, chest pain or dyspnea.   Objective: Vitals:   08/26/20 2324 08/27/20 0317 08/27/20 0737 08/27/20 0800  BP: (!) 119/59 (!) 120/58  108/66  Pulse: 100 100 (!) 104 100  Resp: 16 12 18 17   Temp:    98.5 F (36.9 C)  TempSrc:    Oral  SpO2: 91% 97% 95% 97%  Weight:  82 kg    Height:  Intake/Output Summary (Last 24 hours) at 08/27/2020 1417 Last data filed at 08/27/2020 4403 Gross per 24 hour  Intake 1370.64 ml  Output 100 ml  Net 1270.64 ml   Filed Weights   08/25/20 0500 08/26/20 0500 08/27/20 0317  Weight: 78.5 kg 80 kg 82 kg    Gen: Chronically ill-appearing male in no distress Pulm: Non-labored breathing supplemental oxygen, scant bibasilar crackles.  CV: Regular rate and rhythm. No murmur, rub, or gallop. No JVD, no pitting pedal  edema. GI: Abdomen soft, non-tender, non-distended, with normoactive bowel sounds. No organomegaly or masses felt. Ext: Warm, no deformities Skin: No rashes, lesions or ulcers on visualized skin. Diffuse ichthyosis. Neuro: Alert and oriented. No focal neurological deficits. Psych: Judgement and insight appear normal. Mood & affect appropriate.   Data Reviewed: I have personally reviewed following labs and imaging studies  CBC: Recent Labs  Lab 08/21/20 0342 08/23/20 0322 08/24/20 0058 08/25/20 0330 08/26/20 1505 08/27/20 0221  WBC 6.5 5.7 5.4 6.4  --  5.6  HGB 9.7* 9.2* 8.2* 8.9* 8.3* 8.1*  HCT 29.8* 28.5* 24.8* 27.1* 26.4* 24.9*  MCV 106.0* 107.1* 106.9* 107.1*  --  109.2*  PLT 128* 176 168 171  --  474   Basic Metabolic Panel: Recent Labs  Lab 08/22/20 0637 08/23/20 0322 08/24/20 0058 08/25/20 0330 08/27/20 0221  NA 138 139 138 139 139  K 3.4* 3.6 3.8 3.6 3.7  CL 100 96* 97* 98 102  CO2 31 34* 33* 31 29  GLUCOSE 95 93 110* 89 86  BUN 5* 6* 18 17 9   CREATININE 0.80 0.86 1.23 1.13 0.85  CALCIUM 8.8* 9.1 8.3* 8.4* 8.7*  MG  --  1.5* 1.5* 1.5* 1.6*  PHOS  --   --  4.8* 4.2  --    GFR: Estimated Creatinine Clearance: 84.8 mL/min (by C-G formula based on SCr of 0.85 mg/dL). Liver Function Tests: Recent Labs  Lab 08/25/20 0330  AST 26  ALT 44  ALKPHOS 66  BILITOT 1.0  PROT 5.6*  ALBUMIN 2.8*   No results for input(s): LIPASE, AMYLASE in the last 168 hours. No results for input(s): AMMONIA in the last 168 hours. Coagulation Profile: No results for input(s): INR, PROTIME in the last 168 hours. Cardiac Enzymes: No results for input(s): CKTOTAL, CKMB, CKMBINDEX, TROPONINI in the last 168 hours. BNP (last 3 results) No results for input(s): PROBNP in the last 8760 hours. HbA1C: No results for input(s): HGBA1C in the last 72 hours. CBG: Recent Labs  Lab 08/25/20 2338 08/26/20 1112 08/26/20 1905 08/27/20 0029 08/27/20 1137  GLUCAP 123* 144* 114* 106* 120*    Lipid Profile: No results for input(s): CHOL, HDL, LDLCALC, TRIG, CHOLHDL, LDLDIRECT in the last 72 hours. Thyroid Function Tests: No results for input(s): TSH, T4TOTAL, FREET4, T3FREE, THYROIDAB in the last 72 hours. Anemia Panel: No results for input(s): VITAMINB12, FOLATE, FERRITIN, TIBC, IRON, RETICCTPCT in the last 72 hours. Urine analysis:    Component Value Date/Time   COLORURINE AMBER (A) 10/25/2019 0909   APPEARANCEUR CLEAR 10/25/2019 0909   LABSPEC 1.017 10/25/2019 0909   PHURINE 6.0 10/25/2019 0909   GLUCOSEU NEGATIVE 10/25/2019 0909   HGBUR SMALL (A) 10/25/2019 0909   BILIRUBINUR NEGATIVE 10/25/2019 0909   KETONESUR 5 (A) 10/25/2019 0909   PROTEINUR 100 (A) 10/25/2019 0909   UROBILINOGEN 1.0 07/21/2008 0647   NITRITE NEGATIVE 10/25/2019 0909   LEUKOCYTESUR NEGATIVE 10/25/2019 0909   No results found for this or any previous visit (from the  past 240 hour(s)).    Radiology Studies: No results found.  Scheduled Meds: . sodium chloride   Intravenous Once  . arformoterol  15 mcg Nebulization BID  . budesonide (PULMICORT) nebulizer solution  0.25 mg Nebulization BID  . Chlorhexidine Gluconate Cloth  6 each Topical Q0600  . folic acid  1 mg Oral Daily  . LORazepam  1 mg Oral TID  . magnesium oxide  400 mg Oral BID  . mouth rinse  15 mL Mouth Rinse BID  . multivitamin with minerals  1 tablet Oral Daily  . nicotine  14 mg Transdermal Daily  . pantoprazole  40 mg Oral BID  . revefenacin  175 mcg Nebulization Daily  . thiamine  100 mg Oral Daily   Continuous Infusions:   LOS: 11 days   Time spent: 25 minutes.  Patrecia Pour, MD [facility] [email or web address] 08/27/2020, 2:17 PM

## 2020-08-28 DIAGNOSIS — I10 Essential (primary) hypertension: Secondary | ICD-10-CM | POA: Diagnosis not present

## 2020-08-28 DIAGNOSIS — K922 Gastrointestinal hemorrhage, unspecified: Secondary | ICD-10-CM | POA: Diagnosis not present

## 2020-08-28 DIAGNOSIS — K7030 Alcoholic cirrhosis of liver without ascites: Secondary | ICD-10-CM | POA: Diagnosis not present

## 2020-08-28 DIAGNOSIS — F10230 Alcohol dependence with withdrawal, uncomplicated: Secondary | ICD-10-CM | POA: Diagnosis not present

## 2020-08-28 LAB — SARS CORONAVIRUS 2 (TAT 6-24 HRS): SARS Coronavirus 2: NEGATIVE

## 2020-08-28 NOTE — TOC Transition Note (Signed)
Transition of Care [facility]) - CM/SW Discharge Note   Patient Details  Name: [name] MRN: [id number] Date of Birth: 04/12/1955  [facility] CM/SW Contact:  Joanne Chars, LCSW Phone Number: 08/28/2020, 3:57 PM   Clinical Narrative: Pt discharging to [facility] room 23B.  RN call report to  [phone].  Please call PTAR at [phone].        Final next level of care: Skilled Nursing Facility Barriers to Discharge: Barriers Resolved   Patient Goals and CMS Choice Patient states their goals for this hospitalization and ongoing recovery are:: Rehab CMS Medicare.gov Compare Post Acute Care list provided to:: Patient Represenative (must comment) Choice offered to / list presented to :  (Friend/neighbor)  Discharge Placement              Patient chooses bed at:  [facility]) Patient to be transferred to facility by: [place] Name of family member notified: son [name] Patient and family notified of of transfer: 08/28/20  Discharge Plan and Services In-house Referral: Clinical Social Work   Post Acute Care Choice: [place]                               Social Determinants of Health ([place]) Interventions     Readmission Risk Interventions No flowsheet data found.

## 2020-08-28 NOTE — Progress Notes (Signed)
Physical Therapy Treatment Patient Details Name: Fernando [name] MRN: [id number] DOB: April 16, 1955 Today's Date: 08/28/2020    History of Present Illness 65 yo male presented on 5/14 with weakness, fatigue, and falls. Pt found to have Hgb 5.3 and transfused. Pt with acute upper GI bleed. PMH - etoh abuse, chf, HTN, dislocated lt shoulder, rt reverse total shoulder replacement.    PT Comments    Pt with slow progress with mobility and decr activity tolerance. Continue to work toward incr ambulation distance and activity tolerance. Continue to recommend SNF at dc.    Follow Up Recommendations  SNF     Equipment Recommendations  None recommended by PT    Recommendations for Other Services       Precautions / Restrictions Precautions Precautions: Fall Precaution Comments: watch BP    Mobility  Bed Mobility Overal bed mobility: Needs Assistance Bed Mobility: Supine to Sit     Supine to sit: Supervision;HOB elevated     General bed mobility comments: Assist for safety and incr time and effort for pt. Used bed rails    Transfers Overall transfer level: Needs assistance Equipment used: Rolling walker (2 wheeled) Transfers: Sit to/from Stand Sit to Stand: Min assist         General transfer comment: Assist to bring hips up and for balance. Verbal cues for hand placement  Ambulation/Gait Ambulation/Gait assistance: Min assist;+2 safety/equipment Gait Distance (Feet): 40 Feet (40'x1, 10' x 1) Assistive device: Rolling walker (2 wheeled) Gait Pattern/deviations: Decreased stride length;Wide base of support;Step-through pattern;Trunk flexed Gait velocity: decr Gait velocity interpretation: <1.31 ft/sec, indicative of household ambulator General Gait Details: Assist for balance. Verbal cues to look up.   Stairs             Wheelchair Mobility    Modified Rankin (Stroke Patients Only)       Balance Overall balance assessment: Needs assistance Sitting-balance  support: Feet supported;No upper extremity supported Sitting balance-Leahy Scale: Fair     Standing balance support: Bilateral upper extremity supported Standing balance-Leahy Scale: Poor Standing balance comment: walker and min guard for static standing                            Cognition Arousal/Alertness: Awake/alert Behavior During Therapy: Flat affect Overall Cognitive Status: Impaired/Different from baseline Area of Impairment: Awareness;Problem solving;Safety/judgement;Following commands;Memory;Attention;Orientation                 Orientation Level: Situation Current Attention Level: Selective Memory: Decreased short-term memory Following Commands: Follows one step commands consistently Safety/Judgement: Decreased awareness of safety Awareness: Intellectual Problem Solving: Slow processing;Requires verbal cues        Exercises      General Comments General comments (skin integrity, edema, etc.): Pt denied dizziness/lightheadedness throughout. BP 105/63 sitting in chair after amb. SpO2 89% on RA sitting after amb. Replaced O2      Pertinent Vitals/Pain      Home Living                      Prior Function            PT Goals (current goals can now be found in the care plan section) Acute Rehab PT Goals Patient Stated Goal: none stated Progress towards PT goals: Progressing toward goals    Frequency    Min 2X/week      PT Plan Current plan remains appropriate;Frequency needs to be updated  Co-evaluation              AM-PAC PT "6 Clicks" Mobility   Outcome Measure  Help needed turning from your back to your side while in a flat bed without using bedrails?: A Little Help needed moving from lying on your back to sitting on the side of a flat bed without using bedrails?: A Little Help needed moving to and from a bed to a chair (including a wheelchair)?: A Little Help needed standing up from a chair using your arms  (e.g., wheelchair or bedside chair)?: A Little Help needed to walk in hospital room?: A Little Help needed climbing 3-5 steps with a railing? : A Lot 6 Click Score: 17    End of Session Equipment Utilized During Treatment: Gait belt Activity Tolerance: Patient limited by fatigue Patient left: with call bell/phone within reach;in chair;with chair alarm set Nurse Communication: Mobility status PT Visit Diagnosis: Unsteadiness on feet (R26.81);Other abnormalities of gait and mobility (R26.89);Muscle weakness (generalized) (M62.81);History of falling (Z91.81)     Time: 8329-1916 PT Time Calculation (min) (ACUTE ONLY): 23 min  Charges:  $Gait Training: 23-37 mins                     [place] Pager [phone] Office [place] 08/28/2020, 9:59 AM

## 2020-08-28 NOTE — Discharge Summary (Addendum)
Physician Discharge Summary  Fernando [name] TKZ:[id number] DOB: Aug 30, 1955 DOA: 08/16/2020  PCP: [name], MD  Admit date: 08/16/2020 Discharge date: 08/29/2020  Admitted From: Home Disposition: SNF   Recommendations for Outpatient Follow-up:  1. Follow up with PCP in 1-2 weeks 2. Follow up with GI as outpatient for repeat EGD in 12 weeks to document ulcer healing and to arrange pancreatic protocol CT. 3. Repeat CBC in the next week. Hgb stable at 8.1g/dl without further bleeding.  Home Health: NA Equipment/Devices: 2L O2 Discharge Condition: Stable CODE STATUS: Full Diet recommendation: Dysphagia 3  Brief/Interim Summary: Fernando [name] is a 65 y.o. male with a history of alcohol abuse, chronic HFpEF, HTN, and tobacco use who presented to the ED 5/14 with frequent falls and exertional dyspnea with melenotic stools. He was hypotensive with hgb 5.3g/dl and +FOBT, subsequently admitted to ICU, given 2u PRBCs with improvement in hypotension. EGD 5/15 revealed non-bleeding gactric ulcers, portal hypertensive gastropathy, and duodenitis. The patient had evidence of alcohol withdrawal during admission for which CIWA protocol with librium taper was initiated, as well as COPD exacerbation for which steroids were started with bronchodilators. He has completed treatment for COPD with improvement in respiratory effort and hypoxia. Librium has been tapered with no further evidence of ongoing alcohol withdrawals. He remains severely debilitated and will require PT rehabilitation at SNF to regain PLOF.  Discharge Diagnoses:  Principal Problem:   Acute GI bleeding Active Problems:   Generalized weakness   Essential hypertension   Alcohol dependence (HCC)   Tobacco dependence   Alcoholic cirrhosis of liver without ascites (HCC)  Acute bloodlossanemia secondary to upper GI bleed portal gastropathy, gastric ulcers, duodenitis: Seen at EGD 5/15. Had negative H. pylori Jan 2022.  - Continue  protonix BID for 12 weeks at which time EGD should be repeated to document healing.  - Abstain from alcohol as below.  - Trend hgb, has remained stable since 2u PRBCs.  Pancreatic pseudocyst: CA 19-9 was 14.  - GI to arrange pancreatic protocol CT as outpatient.   Alcohol abuse, dependence, and withdrawal:  - Pt now > 1 week into admission without further evidence of alcohol withdrawal. Does not require librium taper.  Acute hypoxic respiratory failure due to acute exacerbation of COPD, likely also a component of atelectasis based on CXR: - Has completed IV steroids, wheezing resolved. Respiratory effort normalized. CXR on day of discharge revealing improved atelectasis, no infiltrates or pulmonary edema. There is suggestion of chronic interstitial fibrosis which may be reason for protracted oxygen requirement. If persistent, would recommend pulmonary follow up. - Continue brovana, pulmicort, yupelri, prn albuterol. - Incentive spirometry, OOB  Tobacco use:  - Cessation counseling  Thrombocytopenia: Resolved. No splenomegaly on imaging.  - Cirrhosis work up as outpatient is recommended.   Chronic HFpEF: LVEF 55-60%.  - No need for diuretic at this time.   HTN:  - Holding losartan, coreg due to hypotension.   Hyponatremia: Improved  Hyperkalemia: Normalized   Hypomagnesemia: Supplementing  Debility, deconditioning.Physical therapy recommends skilled nursing facility placement which is being pursued.   Discharge Instructions Discharge Instructions    Diet - low sodium heart healthy   Complete by: As directed    Dysphagia III diet with nectar thick   Discharge instructions   Complete by: As directed    Follow-up with your primary care provider at the skilled nursing facility in 3 to 5 days.  No alcohol.  Continue physical therapy.  Continue medications as prescribed.  You  will need to get MRI of the pancreas in 3 to 6 months.  Discuss this with your primary care  physician.   Increase activity slowly   Complete by: As directed      Allergies as of 08/29/2020      Reactions   Atorvastatin    Other reaction(s): joints ache   Doxycycline    Other reaction(s): rash/hive      Medication List    STOP taking these medications   famotidine 40 MG tablet Commonly known as: PEPCID   omeprazole 40 MG capsule Commonly known as: PRILOSEC     TAKE these medications   albuterol (2.5 MG/3ML) 0.083% nebulizer solution Commonly known as: PROVENTIL Take 3 mLs (2.5 mg total) by nebulization every 6 (six) hours as needed for wheezing.   allopurinol 100 MG tablet Commonly known as: ZYLOPRIM Take 200 mg by mouth daily.   arformoterol 15 MCG/2ML Nebu Commonly known as: BROVANA Take 2 mLs (15 mcg total) by nebulization 2 (two) times daily.   budesonide 0.25 MG/2ML nebulizer solution Commonly known as: PULMICORT Take 2 mLs (0.25 mg total) by nebulization 2 (two) times daily.   carvedilol 3.125 MG tablet Commonly known as: COREG Take 1 tablet (3.125 mg total) by mouth 2 (two) times daily. Do not take is Systolic BP is <888 What changed: additional instructions   docusate sodium 100 MG capsule Commonly known as: COLACE Take 1 capsule (100 mg total) by mouth 2 (two) times daily as needed for mild constipation.   folic acid 1 MG tablet Commonly known as: FOLVITE Take 1 mg by mouth daily.   furosemide 40 MG tablet Commonly known as: Lasix Take 1 tablet (40 mg total) by mouth daily.   guaiFENesin-dextromethorphan 100-10 MG/5ML syrup Commonly known as: ROBITUSSIN DM Take 10 mLs by mouth every 6 (six) hours as needed for cough.   losartan 25 MG tablet Commonly known as: Cozaar Take 0.5 tablets (12.5 mg total) by mouth daily. DO not take if systolic blood pressure <916 What changed: additional instructions   magnesium oxide 400 (240 Mg) MG tablet Commonly known as: MAG-OX Take 1 tablet (400 mg total) by mouth 2 (two) times daily.    multivitamin tablet Take 1 tablet by mouth daily.   nicotine 14 mg/24hr patch Commonly known as: NICODERM CQ - dosed in mg/24 hours Place 1 patch (14 mg total) onto the skin daily.   pantoprazole 40 MG tablet Commonly known as: PROTONIX Take 1 tablet (40 mg total) by mouth 2 (two) times daily for 14 days, THEN 1 tablet (40 mg total) daily. Start taking on: Aug 25, 2020   revefenacin 175 MCG/3ML nebulizer solution Commonly known as: YUPELRI Take 3 mLs (175 mcg total) by nebulization daily. Start taking on: Aug 30, 2020   thiamine 100 MG tablet Take 1 tablet (100 mg total) by mouth daily.       Contact information for follow-up providers    Avva, Ravisankar, MD Follow up.   Specialty: Internal Medicine Contact information: [place] [address] [phone]        Buford Dresser, MD .   Specialty: Cardiology Contact information: [address] [place] [address] [phone]        [facility] Follow up.   Specialty: Pulmonology Contact information: [place] [address] [phone]           Contact information for after-discharge care    Destination  [facility] .   Service: Skilled Nursing Contact information: [place] [phone]                 Allergies  Allergen Reactions  . Atorvastatin     Other reaction(s): joints ache  . Doxycycline     Other reaction(s): rash/hive    Consultations:  GI  PCCM  Procedures/Studies: CT HEAD WO CONTRAST  Result Date: 08/22/2020 CLINICAL DATA:  Mental status change. EXAM: CT HEAD WITHOUT CONTRAST TECHNIQUE: Contiguous axial images were obtained from the base of the skull through the vertex without intravenous contrast. COMPARISON:  Head CT 08/16/2020 FINDINGS: Brain: Unchanged degree of atrophy and chronic small vessel ischemia from  previous. No intracranial hemorrhage, mass effect, or midline shift. No hydrocephalus. The basilar cisterns are patent. No evidence of territorial infarct or acute ischemia. No extra-axial or intracranial fluid collection. Vascular: Atherosclerosis of skullbase vasculature without hyperdense vessel or abnormal calcification. Skull: No fracture or focal lesion. Sinuses/Orbits: Again seen mucosal thickening involving the right maxillary sinus. Similar opacification of lower left mastoid air cells. Bilateral cataract resection. No acute findings. Other: Right parietal sebaceous cyst. IMPRESSION: 1. No acute intracranial abnormality. 2. Unchanged atrophy and chronic small vessel ischemia. Electronically Signed   By: Keith Rake M.D.   On: 08/22/2020 23:11   CT Head Wo Contrast  Result Date: 08/16/2020 CLINICAL DATA:  Weakness with fall, no loss of consciousness. EXAM: CT HEAD WITHOUT CONTRAST TECHNIQUE: Contiguous axial images were obtained from the base of the skull through the vertex without intravenous contrast. COMPARISON:  January 26, 2020 FINDINGS: Brain: No evidence of acute large vascular territory infarction, hemorrhage, hydrocephalus, extra-axial collection or mass lesion/mass effect. Stable mild age related global parenchymal volume loss. Similar mild burden chronic small-vessel white matter ischemic change. 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 Vascular: No hyperdense vessel. Atherosclerotic calcifications of the internal carotid and vertebral arteries at the skull base. Skull: Stable round hyperdense 1.2 cm subcutaneous nodule posterior to the right occiput, likely sebaceous cyst. Negative for fracture or focal lesion. Sinuses/Orbits: Mucosal thickening of the right maxillary sinus is again visualized. Small left mastoid effusion. Other: Dental hardware. IMPRESSION: 1. No acute intracranial findings. 2. Stable mild age related global parenchymal volume loss and chronic  small-vessel white matter ischemic change. 3. Small left mastoid effusion. Electronically Signed   By: Dahlia Bailiff MD   On: 08/16/2020 18:58   DG CHEST PORT 1 VIEW  Result Date: 08/29/2020 CLINICAL DATA:  Shortness of breath, bibasilar scarring versus atelectasis EXAM: PORTABLE CHEST 1 VIEW COMPARISON:  Portable exam 0923 hours compared to 08/23/2020 FINDINGS: Upper normal size of cardiac silhouette. Stable mediastinal contours. Chronic interstitial lung disease changes throughout both lungs, greatest at bases and unchanged since the previous exam. Superimposed mild LEFT basilar atelectasis on previous exam resolved. No acute segmental consolidation, pleural effusion, or pneumothorax. RIGHT shoulder prosthesis noted. IMPRESSION: Chronic interstitial lung disease, greatest at lung bases. Resolved subsegmental atelectasis at LEFT base. Electronically Signed   By: Lavonia Dana M.D.   On: 08/29/2020 09:59   DG CHEST PORT 1 VIEW  Result Date: 08/23/2020 CLINICAL DATA:  Shortness of breath. EXAM: PORTABLE CHEST 1 VIEW COMPARISON:  Aug 22, 2020 FINDINGS: Mildly decreased lung volumes are seen with very mild, diffuse, chronic appearing increased interstitial lung markings. Mild scarring and/or atelectasis is seen within the bilateral lung bases. There is no evidence of a pleural effusion or pneumothorax. The  cardiac silhouette is mildly enlarged and unchanged in size. A radiopaque right shoulder replacement is seen. Degenerative changes are noted throughout the thoracic spine. IMPRESSION: Mild bibasilar scarring and/or atelectasis. Electronically Signed   By: Virgina Norfolk M.D.   On: 08/23/2020 23:52   DG CHEST PORT 1 VIEW  Result Date: 08/22/2020 CLINICAL DATA:  Shortness of breath EXAM: PORTABLE CHEST 1 VIEW COMPARISON:  08/18/2020 FINDINGS: Cardiac shadow is stable. Previously seen vascular congestion and edema have resolved in the interval from the prior exam. Minimal basilar atelectasis is noted. No  acute bony abnormality is noted. IMPRESSION: Resolution of previously seen vascular congestion and edema. Mild bibasilar atelectasis. Electronically Signed   By: Inez Catalina M.D.   On: 08/22/2020 21:18   DG Chest Port 1 View  Result Date: 08/18/2020 CLINICAL DATA:  Difficulty breathing EXAM: PORTABLE CHEST 1 VIEW COMPARISON:  08/16/20 FINDINGS: Cardiac shadow remains enlarged. Increasing vascular congestion is noted with some edematous changes bilaterally. No acute bony abnormality is seen. Prior right shoulder replacement is seen. IMPRESSION: Increasing vascular congestion with parenchymal edema. Electronically Signed   By: Inez Catalina M.D.   On: 08/18/2020 23:32   DG Chest Port 1 View  Result Date: 08/16/2020 CLINICAL DATA:  Fall, hypotension EXAM: PORTABLE CHEST 1 VIEW COMPARISON:  02/02/2020 FINDINGS: The heart size and mediastinal contours are within normal limits. Redemonstrated coarse, fibrotic opacity at the bilateral lung bases. No acute appearing airspace opacity. Status post right shoulder reverse total arthroplasty. IMPRESSION: Redemonstrated coarse, fibrotic opacity at the bilateral lung bases. No acute appearing airspace opacity. Electronically Signed   By: Eddie Candle M.D.   On: 08/16/2020 18:26   DG Swallowing Func-Speech Pathology  Result Date: 08/22/2020 Objective Swallowing Evaluation: Type of Study: MBS-Modified Barium Swallow Study  Patient Details Name: JONTA GASTINEAU MRN: [id number] Date of Birth: 1955/11/27 Today's Date: 08/22/2020 Time: SLP Start Time (ACUTE ONLY): 1225 -SLP Stop Time (ACUTE ONLY): 1242 SLP Time Calculation (min) (ACUTE ONLY): 16.25 min Past Medical History: Past Medical History: Diagnosis Date . Alcohol dependence ([place])  . Arthritis  . Cataract  . Chronic systolic (congestive) heart failure (HCC)   EF 25-30% in 10/2019 . Dyslipidemia  . Essential hypertension  . History of kidney stones  . Hyperlipidemia  . Hypertension  . Pneumonia  . Prosthetic shoulder  infection, initial encounter [facility])  Past Surgical History: Past Surgical History: Procedure Laterality Date . BIOPSY  04/21/2020  Procedure: BIOPSY;  Surgeon: Rush Landmark Telford Nab., MD;  Location: [place];  Service: Gastroenterology;; . BIOPSY  08/17/2020  Procedure: BIOPSY;  Surgeon: Carol Ada, MD;  Location: [place];  Service: Endoscopy;; . COLONOSCOPY   . COLONOSCOPY    multiple . ESOPHAGOGASTRODUODENOSCOPY (EGD) WITH PROPOFOL N/A 01/31/2020  Procedure: ESOPHAGOGASTRODUODENOSCOPY (EGD) WITH PROPOFOL;  Surgeon: Doran Stabler, MD;  Location: [place];  Service: Gastroenterology;  Laterality: N/A; . ESOPHAGOGASTRODUODENOSCOPY (EGD) WITH PROPOFOL N/A 04/21/2020  Procedure: ESOPHAGOGASTRODUODENOSCOPY (EGD) WITH PROPOFOL;  Surgeon: Rush Landmark Telford Nab., MD;  Location: [place];  Service: Gastroenterology;  Laterality: N/A; . ESOPHAGOGASTRODUODENOSCOPY (EGD) WITH PROPOFOL N/A 08/17/2020  Procedure: ESOPHAGOGASTRODUODENOSCOPY (EGD) WITH PROPOFOL;  Surgeon: Carol Ada, MD;  Location: [place];  Service: Endoscopy;  Laterality: N/A; . EUS N/A 04/21/2020  Procedure: UPPER ENDOSCOPIC ULTRASOUND (EUS) LINEAR;  Surgeon: Irving Copas., MD;  Location: [place];  Service: Gastroenterology;  Laterality: N/A; . HERNIA REPAIR  1990 . KNEE SURGERY  1975  left men. repair . KNEE SURGERY  1975 . REVERSE SHOULDER ARTHROPLASTY Right  06/21/2018  Procedure: REVERSE SHOULDER ARTHROPLASTY;  Surgeon: Hiram Gash, MD;  Location: [facility];  Service: Orthopedics;  Laterality: Right; . ROTATOR CUFF REPAIR    right . ROTATOR CUFF REPAIR   . TONSILLECTOMY   . TOTAL SHOULDER REPLACEMENT  2020 . UMBILICAL HERNIA REPAIR   HPI: Pt is a 65 yo male who presented on 5/14 with weakness, fatigue, and falls. Pt found to have Hgb 5.3, acute upper GI bleed. Upper endoscopy 5/15: Benign-appearing esophageal stenosis, non-bleeding gastric ulcers, duodenitis, non-bleeding duodenal ulcers. Clear liquid diet initiated  with recommendation from GI on 5/17 to advance as tolerated. SLP consulted due to signs of aspiration with pills on 5/19 and c/o of odynophagia. PMH - ETOHabuse, chf, HTN, dislocated lt shoulder, rt reverse total shoulder replacement.  No data recorded Assessment / Plan / Recommendation CHL IP CLINICAL IMPRESSIONS 08/22/2020 Clinical Impression Pt presented with oropharyngeal dysphagia characterized by intemittent oral holding, reduced bolus cohesion, a pharyngeal delay, mildly reduced lingual retraction, and reduced anterior laryngeal movement. He demonstrated premature spillage to the valleculae and pyriform sinuses, and mild vallecular and pyriform sinus residue. Pharyngeal residue was improved with a liquid wash. Pt exhibited intermittent penetration (PAS 3, 5) of individual sips of thin liquids with trace delayed aspiration (PAS 7). Immediate aspiration (PAS 7) was noted once with thin liquids via straw. Instances of aspiration consistently triggered coughing but this was ineffective in expelling aspirated material. No penetration/aspiration was noted with solids or liquids. It is anticipated that the pt's dyspahgia is at least partly cognitively based. A dysphagia 3 diet with nectar thick liquids will be initiated at this time. SLP will follow for dysphagia treatment and his prognosis for diet advancement is judged to be good at this time. SLP Visit Diagnosis Dysphagia, oropharyngeal phase (R13.12) Attention and concentration deficit following -- Frontal lobe and executive function deficit following -- Impact on safety and function Mild aspiration risk   CHL IP TREATMENT RECOMMENDATION 08/22/2020 Treatment Recommendations Therapy as outlined in treatment plan below   Prognosis 08/22/2020 Prognosis for Safe Diet Advancement Good Barriers to Reach Goals Cognitive deficits Barriers/Prognosis Comment -- CHL IP DIET RECOMMENDATION 08/22/2020 SLP Diet Recommendations Dysphagia 3 (Mech soft) solids;Nectar thick liquid  Liquid Administration via Cup;Straw Medication Administration Whole meds with liquid Compensations Slow rate;Small sips/bites Postural Changes Seated upright at 90 degrees   CHL IP OTHER RECOMMENDATIONS 08/22/2020 Recommended Consults -- Oral Care Recommendations Oral care BID;Staff/trained caregiver to provide oral care Other Recommendations --   CHL IP FOLLOW UP RECOMMENDATIONS 08/22/2020 Follow up Recommendations Skilled Nursing facility   [facility] IP FREQUENCY AND DURATION 08/22/2020 Speech Therapy Frequency (ACUTE ONLY) min 2x/week Treatment Duration 2 weeks      CHL IP ORAL PHASE 08/22/2020 Oral Phase Impaired Oral - Pudding Teaspoon -- Oral - Pudding Cup -- Oral - Honey Teaspoon -- Oral - Honey Cup -- Oral - Nectar Teaspoon -- Oral - Nectar Cup Holding of bolus;Decreased bolus cohesion;Premature spillage Oral - Nectar Straw Holding of bolus;Decreased bolus cohesion;Premature spillage Oral - Thin Teaspoon -- Oral - Thin Cup Holding of bolus;Decreased bolus cohesion;Premature spillage Oral - Thin Straw Holding of bolus;Decreased bolus cohesion;Premature spillage Oral - Puree Holding of bolus;Premature spillage Oral - Mech Soft -- Oral - Regular Holding of bolus;Delayed oral transit Oral - Multi-Consistency -- Oral - Pill -- Oral Phase - Comment --  CHL IP PHARYNGEAL PHASE 08/22/2020 Pharyngeal Phase Impaired Pharyngeal- Pudding Teaspoon -- Pharyngeal -- Pharyngeal- Pudding Cup -- Pharyngeal -- Pharyngeal- Honey Teaspoon -- Pharyngeal --  Pharyngeal- Honey Cup -- Pharyngeal -- Pharyngeal- Nectar Teaspoon -- Pharyngeal -- Pharyngeal- Nectar Cup Reduced anterior laryngeal mobility;Delayed swallow initiation-pyriform sinuses;Pharyngeal residue - valleculae;Pharyngeal residue - pyriform;Reduced tongue base retraction Pharyngeal -- Pharyngeal- Nectar Straw Reduced anterior laryngeal mobility;Delayed swallow initiation-pyriform sinuses;Pharyngeal residue - valleculae;Pharyngeal residue - pyriform;Reduced tongue base retraction  Pharyngeal -- Pharyngeal- Thin Teaspoon -- Pharyngeal -- Pharyngeal- Thin Cup Reduced anterior laryngeal mobility;Delayed swallow initiation-pyriform sinuses;Pharyngeal residue - valleculae;Pharyngeal residue - pyriform;Reduced tongue base retraction;Penetration/Aspiration during swallow;Penetration/Apiration after swallow Pharyngeal Material enters airway, remains ABOVE vocal cords and not ejected out;Material enters airway, CONTACTS cords and not ejected out;Material enters airway, passes BELOW cords and not ejected out despite cough attempt by patient Pharyngeal- Thin Straw Reduced anterior laryngeal mobility;Delayed swallow initiation-pyriform sinuses;Pharyngeal residue - valleculae;Pharyngeal residue - pyriform;Reduced tongue base retraction;Penetration/Aspiration during swallow Pharyngeal Material enters airway, passes BELOW cords and not ejected out despite cough attempt by patient Pharyngeal- Puree Delayed swallow initiation-vallecula Pharyngeal -- Pharyngeal- Mechanical Soft -- Pharyngeal -- Pharyngeal- Regular Reduced anterior laryngeal mobility;Delayed swallow initiation-pyriform sinuses;Pharyngeal residue - valleculae;Pharyngeal residue - pyriform;Reduced tongue base retraction Pharyngeal -- Pharyngeal- Multi-consistency -- Pharyngeal -- Pharyngeal- Pill Reduced anterior laryngeal mobility;Delayed swallow initiation-pyriform sinuses;Pharyngeal residue - valleculae;Pharyngeal residue - pyriform;Reduced tongue base retraction Pharyngeal -- Pharyngeal Comment --  CHL IP CERVICAL ESOPHAGEAL PHASE 08/22/2020 Cervical Esophageal Phase WFL Pudding Teaspoon -- Pudding Cup -- Honey Teaspoon -- Honey Cup -- Nectar Teaspoon -- Nectar Cup -- Nectar Straw -- Thin Teaspoon -- Thin Cup -- Thin Straw -- Puree -- Mechanical Soft -- Regular -- Multi-consistency -- Pill -- Cervical Esophageal Comment -- Shanika I. Hardin Negus, [place], [place] Office number [phone] Pager [phone] Horton Marshall 08/22/2020, 2:14 PM              ECHOCARDIOGRAM COMPLETE  Result Date: 08/17/2020    ECHOCARDIOGRAM REPORT   Patient Name:   DEBBIE BELLUCCI Date of Exam: 08/17/2020 Medical Rec #:  [id number]      Height:       66.0 in Accession #:    [id number]     Weight:       163.4 lb Date of Birth:  [birth year]      BSA:          1.835 m Patient Age:    65 years       BP:           81/52 mmHg Patient Gender: M              HR:           95 bpm. Exam Location:  Inpatient Procedure: 2D Echo, Cardiac Doppler, Color Doppler and Intracardiac            Opacification Agent Indications:    R06.02 SOB  History:        Patient has prior history of Echocardiogram examinations, most                 recent 01/29/2020. CHF.  Sonographer:    Merrie Roof Referring Phys: [id number] [place] A RICHARDS IMPRESSIONS  1. Left ventricular ejection fraction, by estimation, is 55 to 60%. The left ventricle has normal function. The left ventricle has no regional wall motion abnormalities. The left ventricular internal cavity size was mildly dilated. There is mild left ventricular hypertrophy. Left ventricular diastolic parameters were normal.  2. Right ventricular systolic function is normal. The right ventricular size is mildly enlarged. Tricuspid regurgitation signal is inadequate for assessing PA pressure.  3.  Left atrial size was mildly dilated.  4. The mitral valve is normal in structure. No evidence of mitral valve regurgitation.  5. The aortic valve was not well visualized. Aortic valve regurgitation is not visualized. No aortic stenosis is present.  6. The inferior vena cava is normal in size with greater than 50% respiratory variability, suggesting right atrial pressure of 3 mmHg. Comparison(s): Compared to prior, significant improvement in LV systolic function. FINDINGS  Left Ventricle: Left ventricular ejection fraction, by estimation, is 55 to 60%. The left ventricle has normal function. The left ventricle has no regional wall motion  abnormalities. The left ventricular internal cavity size was mildly dilated. There is  mild left ventricular hypertrophy. Left ventricular diastolic parameters were normal. Right Ventricle: The right ventricular size is mildly enlarged. No increase in right ventricular wall thickness. Right ventricular systolic function is normal. Tricuspid regurgitation signal is inadequate for assessing PA pressure. Left Atrium: Left atrial size was mildly dilated. Right Atrium: Right atrial size was not well visualized. Pericardium: Trivial pericardial effusion is present. Mitral Valve: The mitral valve is normal in structure. No evidence of mitral valve regurgitation. Tricuspid Valve: The tricuspid valve is not well visualized. Tricuspid valve regurgitation is not demonstrated. Aortic Valve: The aortic valve was not well visualized. Aortic valve regurgitation is not visualized. No aortic stenosis is present. Aortic valve mean gradient measures 3.0 mmHg. Aortic valve peak gradient measures 6.2 mmHg. Aortic valve area, by VTI measures 3.34 cm. Pulmonic Valve: The pulmonic valve was not well visualized. Pulmonic valve regurgitation is not visualized. Aorta: The aortic root is normal in size and structure. Venous: The inferior vena cava is normal in size with greater than 50% respiratory variability, suggesting right atrial pressure of 3 mmHg. IAS/Shunts: The interatrial septum was not well visualized.  LEFT VENTRICLE PLAX 2D LVIDd:         5.30 cm  Diastology LVIDs:         4.40 cm  LV e' medial:    10.90 cm/s LV PW:         1.00 cm  LV E/e' medial:  8.6 LV IVS:        0.80 cm  LV e' lateral:   13.60 cm/s LVOT diam:     2.30 cm  LV E/e' lateral: 6.9 LV SV:         71 LV SV Index:   39 LVOT Area:     4.15 cm  RIGHT VENTRICLE             IVC RV Basal diam:  4.30 cm     IVC diam: 1.40 cm RV Mid diam:    4.10 cm RV S prime:     10.40 cm/s TAPSE (M-mode): 1.9 cm LEFT ATRIUM             Index       RIGHT ATRIUM           Index LA diam:         3.20 cm 1.74 cm/m  RA Area:     25.20 cm LA Vol (A2C):   76.3 ml 41.58 ml/m RA Volume:   90.40 ml  49.26 ml/m LA Vol (A4C):   52.7 ml 28.72 ml/m LA Biplane Vol: 64.7 ml 35.26 ml/m  AORTIC VALVE AV Area (Vmax):    3.38 cm AV Area (Vmean):   3.07 cm AV Area (VTI):     3.34 cm AV Vmax:  124.00 cm/s AV Vmean:          77.800 cm/s AV VTI:            0.213 m AV Peak Grad:      6.2 mmHg AV Mean Grad:      3.0 mmHg LVOT Vmax:         101.00 cm/s LVOT Vmean:        57.500 cm/s LVOT VTI:          0.171 m LVOT/AV VTI ratio: 0.80  AORTA Ao Root diam: 2.80 cm Ao Asc diam:  3.40 cm MITRAL VALVE MV Area (PHT): 5.50 cm     SHUNTS MV Decel Time: 138 msec     Systemic VTI:  0.17 m MV E velocity: 93.50 cm/s   Systemic Diam: 2.30 cm MV A velocity: 121.00 cm/s MV E/A ratio:  0.77 Oswaldo Milian MD Electronically signed by Oswaldo Milian MD Signature Date/Time: 08/17/2020/3:52:11 PM    Final    Subjective: Denies bleeding, bruising, chest pain or dyspnea. No abd pain, N/V. No reports of recurrent alcohol withdrawal.  Discharge Exam: Vitals:   08/29/20 0837 08/29/20 1018  BP:    Pulse:    Resp:    Temp:    SpO2: 98% 98%   General: Pt is alert, awake, not in acute distress Cardiovascular: RRR, S1/S2 +, no rubs, no gallops Respiratory: CTA bilaterally, no wheezing, no rhonchi Abdominal: Soft, NT, ND, bowel sounds + Extremities: No pitting edema, no cyanosis  Labs: BNP (last 3 results) Recent Labs    10/25/19 1240 08/19/20 0155 08/22/20 2134  BNP 349.4* 449.3* 465.0*   Basic Metabolic Panel: Recent Labs  Lab 08/23/20 0322 08/24/20 0058 08/25/20 0330 08/27/20 0221  NA 139 138 139 139  K 3.6 3.8 3.6 3.7  CL 96* 97* 98 102  CO2 34* 33* 31 29  GLUCOSE 93 110* 89 86  BUN 6* 18 17 9   CREATININE 0.86 1.23 1.13 0.85  CALCIUM 9.1 8.3* 8.4* 8.7*  MG 1.5* 1.5* 1.5* 1.6*  PHOS  --  4.8* 4.2  --    Liver Function Tests: Recent Labs  Lab 08/25/20 0330  AST 26  ALT 44   ALKPHOS 66  BILITOT 1.0  PROT 5.6*  ALBUMIN 2.8*   CBC: Recent Labs  Lab 08/23/20 0322 08/24/20 0058 08/25/20 0330 08/26/20 1505 08/27/20 0221  WBC 5.7 5.4 6.4  --  5.6  HGB 9.2* 8.2* 8.9* 8.3* 8.1*  HCT 28.5* 24.8* 27.1* 26.4* 24.9*  MCV 107.1* 106.9* 107.1*  --  109.2*  PLT 176 168 171  --  162   CBG: Recent Labs  Lab 08/26/20 1112 08/26/20 1905 08/27/20 0029 08/27/20 1137 08/27/20 1804  GLUCAP 144* 114* 106* 120* 121*    Time coordinating discharge: Approximately 40 minutes  Patrecia Pour, MD  [facility] 08/29/2020, 11:56 AM

## 2020-08-28 NOTE — Progress Notes (Signed)
  Speech Language Pathology Treatment: Dysphagia  Patient Details Name: Fernando [name] MRN: [id number] DOB: 1955/09/11 Today's Date: 08/28/2020 Time: 5038-8828 SLP Time Calculation (min) (ACUTE ONLY): 15.57 min  Assessment / Plan / Recommendation Clinical Impression  Pt was seen for dysphagia treatment with his son present. Pt's son was educated regarding the results of the modified barium swallow study, diet recommendations, and swallowing precautions. Video recording of the study was used to facilitate education and he verbalized understanding regarding all areas of education. Pt's son reported that the pt was asymptomatic prior to admission. Pt was seen during lunch with a meal of beef pot roast, mashed potatoes, sliced carrots, and nectar thick liquids. Pt was able to verbalize need for smaller boluses, but his observance of this was inconsistent. Coughing was intermittently noted and appeared to correlate with intake of larger boluses and no use of a liquid wash. Intermittent supervision is still recommended for cueing. Pt currently has discharge orders and pt's son reported that they are hopeful that the pt will leave today. Continued dysphagia treatment is recommended at next venue of care.   HPI HPI: Pt is a 65 yo male who presented on 5/14 with weakness, fatigue, and falls. Pt found to have Hgb 5.3, acute upper GI bleed. Upper endoscopy 5/15: Benign-appearing esophageal stenosis, non-bleeding gastric ulcers, duodenitis, non-bleeding duodenal ulcers. Clear liquid diet initiated with recommendation from GI on 5/17 to advance as tolerated. SLP consulted due to signs of aspiration with pills on 5/19 and c/o of odynophagia. PMH - ETOHabuse, chf, HTN, dislocated lt shoulder, rt reverse total shoulder replacement.      SLP Plan  Continue with current plan of care       Recommendations  Diet recommendations: Dysphagia 3 (mechanical soft);Nectar-thick liquid Liquids provided via:  Cup;Straw Medication Administration: Whole meds with puree Supervision: Patient able to self feed;Intermittent supervision to cue for compensatory strategies Compensations: Slow rate;Small sips/bites;Follow solids with liquid Postural Changes and/or Swallow Maneuvers: Seated upright 90 degrees                Oral Care Recommendations: Oral care BID Follow up Recommendations: Skilled Nursing facility SLP Visit Diagnosis: Dysphagia, oropharyngeal phase (R13.12) Plan: Continue with current plan of care       Nevin Grizzle I. Hardin Negus, [place], [place] Office number [phone] Pager [place] 08/28/2020, 1:29 PM

## 2020-08-28 NOTE — TOC Progression Note (Addendum)
Transition of Care [facility]) - Progression Note    Patient Details  Name: Fernando [name] MRN: [id number] Date of Birth: July 08, 1955  Transition of Care [facility]) CM/SW Contact  Joanne Chars, LCSW Phone Number: 08/28/2020, 9:04 AM  Clinical Narrative:  CSW continuing to work on securing SNF bed:  Helene Kelp at [facility] reports no beds until next Thursday, June 2. Lashay at [facility] will not have bed until Tuesday, May 31.   Tanya at [facility] reviewing pt and checking bed availability.  Bed offer previously made. Juliann Pulse at [facility] has retracted bed offer.    1000: Tonya at [facility] can accept pt today.  CSW contacted pt son [name], updated him on bed offers, he is agreeable to discharge to [facility].  Pt going to room 23B.       Expected Discharge Plan: [place] Barriers to Discharge: Continued Medical Work up  Expected Discharge Plan and Services Expected Discharge Plan: [place] In-house Referral: Clinical Social Work   Post Acute Care Choice: [place] Living arrangements for the past 2 months: Single Family Home Expected Discharge Date: 08/28/20                                     Social Determinants of Health (SDOH) Interventions    Readmission Risk Interventions No flowsheet data found.

## 2020-08-28 NOTE — Plan of Care (Signed)

## 2020-08-28 NOTE — Progress Notes (Signed)
Report called to Claris Gower, Therapist, sports at [facility]. Questions and concerns answered. [facility] called for patient transportation and they stated they have 12 patients ahead of him and the patient was on their list to be picked up. Informed the patient's son who is in the patient's room.

## 2020-08-29 ENCOUNTER — Inpatient Hospital Stay (HOSPITAL_COMMUNITY): Payer: Medicare Other

## 2020-08-29 MED ORDER — BUDESONIDE 0.25 MG/2ML IN SUSP: 0.2500 mg | Freq: Two times a day (BID) | RESPIRATORY_TRACT | 12 refills | Status: DC

## 2020-08-29 MED ORDER — ARFORMOTEROL TARTRATE 15 MCG/2ML IN NEBU: 15.0000 ug | INHALATION_SOLUTION | Freq: Two times a day (BID) | RESPIRATORY_TRACT | Status: DC

## 2020-08-29 MED ORDER — REVEFENACIN 175 MCG/3ML IN SOLN: 175.0000 ug | Freq: Every day | RESPIRATORY_TRACT | Status: DC

## 2020-08-29 MED ORDER — FUROSEMIDE 10 MG/ML IJ SOLN
40.0000 mg | Freq: Once | INTRAMUSCULAR | Status: AC
  Administered 2020-08-29: 40 mg via INTRAVENOUS
  Filled 2020-08-29: qty 4

## 2020-08-29 MED ORDER — LORAZEPAM 1 MG PO TABS: 1.0000 mg | ORAL_TABLET | Freq: Four times a day (QID) | ORAL | 0 refills | Status: DC | PRN

## 2020-08-29 NOTE — TOC Transition Note (Signed)
Transition of Care [facility]) - CM/SW Discharge Note   Patient Details  Name: Fernando [name] MRN: [id number] Date of Birth: [birth year]  [facility] of [facility]) CM/SW Contact:  Bethann Berkshire, [place] Phone Number: 08/29/2020, 11:59 AM   Clinical Narrative:     Patient will DC to: [facility] Anticipated DC date: 08/29/20 Family notified: Pt son [name] Transport by: Corey Harold   Per MD patient ready for DC to [facility]. RN, patient, patient's family, and facility notified of DC. Discharge Summary and FL2 sent to facility. RN to call report prior to discharge [phone] room 23B). DC packet on chart. Ambulance transport requested for patient.   CSW will sign off for now as social work intervention is no longer needed. Please consult [place] again if new needs arise.   Final next level of care: Skilled Nursing Facility Barriers to Discharge: Barriers Resolved   Patient Goals and CMS Choice Patient states their goals for this hospitalization and ongoing recovery are:: Rehab CMS Medicare.gov Compare Post Acute Care list provided to:: Patient Represenative (must comment) Choice offered to / list presented to :  (Friend/neighbor)  Discharge Placement              Patient chooses bed at:  [facility]) Patient to be transferred to facility by: [place] Name of family member notified: son [name] Patient and family notified of of transfer: 08/28/20  Discharge Plan and Services In-house Referral: Clinical Social Work   Post Acute Care Choice: [place]                               Social Determinants of Health ([place]) Interventions     Readmission Risk Interventions No flowsheet data found.

## 2020-08-29 NOTE — Progress Notes (Signed)
[facility] came to pick up patient for discharge to SNF. 02  Sats 79% on RA. Sats increased to 99% on 2L n/c. Facility notified by [facility]. Stated they could not accept him due to staff and no way to get O2 this shift.[facility] Hospitalist notified of delayed discharge.

## 2020-08-29 NOTE — Progress Notes (Addendum)
Patient discharged yesterday morning without plan for oxygen after RN reported SpO2 in high 90%'s on room air. [facility] arrived at 3am today to transport patient when supposedly the patient had saturations in the 70's. Other vital signs stable, no mention of respiratory effort, and somehow the patient's oxygen came up to 99% with just 2L.   Seen at bedside this morning, feels well, wants to discharge this morning. No dyspnea, says he always sounds crackly to doctors in his lungs. No cough, leg swelling, chest pain, or bleeding. Was turned up to 5L O2 at some point without documented hypoxia. This was turned down to 2L early this morning.   BP (!) 124/56 (BP Location: Right Arm)   Pulse (!) 103   Temp 98.3 F (36.8 C) (Oral)   Resp 18   Ht 5\' 6"  (1.676 m)   Wt 81.8 kg   SpO2 98%   BMI 29.11 kg/m   Gen: 64 y.o. male in no distress Pulm: Nonlabored breathing supplemental oxygen, some crackles diffusely without wheezing. CV: Regular rate and rhythm. No murmur, rub, or gallop. No JVD, no significant dependent edema. GI: Abdomen soft, non-tender, non-distended, with normoactive bowel sounds.  Ext: Warm, no deformities Skin: No new rashes, lesions or ulcers on visualized skin. Neuro: Alert and oriented. No focal neurological deficits. Psych: Judgement and insight appear fair. Mood euthymic & affect congruent. Behavior is appropriate.    CXR personally reviewed this morning shows improved LLL basilar aeration, no effusion, PTX, edema, or infiltrate. There is prominent insterstitial markings that may have chronic component. Given lasix IV x1 this morning.   He has remained under close observation all morning by RN and myself, has not been hypoxic on 2L O2 and remains feeling well.   A/P: Patient remains stable for discharge. I have addended DC summary to include recommendations to try to liberate from oxygen and follow up with pulmonary after discharge.   Vance Gather, MD 08/29/2020 6:48 AM

## 2020-08-29 NOTE — Progress Notes (Signed)
Attempted to call report at [phone], on HOLD for 12 mins but no answer. Will try again later.

## 2020-08-29 NOTE — Progress Notes (Signed)
Occupational Therapy Treatment Patient Details Name: Fernando [name] MRN: [id number] DOB: Aug 16, 1955 Today's Date: 08/29/2020    History of present illness 65 yo male presented on 5/14 with weakness, fatigue, and falls. Pt found to have Hgb 5.3 and transfused. Pt with acute upper GI bleed. PMH - etoh abuse, chf, HTN, dislocated lt shoulder, rt reverse total shoulder replacement.   OT comments  Patient making progress toward goals. Focus of session on muscle strengthening, functional transfers in prep for ADLs, and activity tolerance. Patient continues to require Mod A overall for ADLs grossly with use of RW with deficits in strength, cognition, and activity tolerance. OT will continue to follow acutely.    Follow Up Recommendations  SNF;Supervision/Assistance - 24 hour    Equipment Recommendations  Other (comment) (Defer to next level of care.)    Recommendations for Other Services      Precautions / Restrictions Precautions Precautions: Fall Precaution Comments: watch vitals Restrictions Weight Bearing Restrictions: No       Mobility Bed Mobility Overal bed mobility: Needs Assistance Bed Mobility: Supine to Sit     Supine to sit: Supervision;HOB elevated     General bed mobility comments: Supervision A for safety/line management.    Transfers Overall transfer level: Needs assistance Equipment used: Rolling walker (2 wheeled) Transfers: Sit to/from Stand Sit to Stand: Min assist         General transfer comment: Min A for steadying/balance. Limited 2/2 symptomatic hypotension.    Balance Overall balance assessment: Needs assistance Sitting-balance support: Feet supported;No upper extremity supported Sitting balance-Leahy Scale: Fair     Standing balance support: Bilateral upper extremity supported Standing balance-Leahy Scale: Poor                             ADL either performed or assessed with clinical judgement   ADL Overall ADL's : Needs  assistance/impaired                     Lower Body Dressing: Moderate assistance;Sit to/from stand Lower Body Dressing Details (indicate cue type and reason): Mod A to don footwear seated EOB. Patient quick to fatigue. Able to attain figure-4 position with RLE over L knee but unable to cross LLE over R knee. Toilet Transfer: Minimal Designer, jewellery Details (indicate cue type and reason): Simulated with transfer to EOB with use of RW and light Min A. Toileting- Clothing Manipulation and Hygiene: Minimal assistance;Sit to/from stand Toileting - Clothing Manipulation Details (indicate cue type and reason): With use of urinal. Patient uanble to empty bladder.             Vision       Perception     Praxis      Cognition Arousal/Alertness: Awake/alert Behavior During Therapy: Flat affect Overall Cognitive Status: Impaired/Different from baseline Area of Impairment: Awareness;Problem solving;Safety/judgement;Following commands;Memory;Attention;Orientation                 Orientation Level: Situation Current Attention Level: Selective Memory: Decreased short-term memory Following Commands: Follows one step commands consistently Safety/Judgement: Decreased awareness of safety Awareness: Intellectual Problem Solving: Slow processing;Requires verbal cues General Comments: Decreased safety awareness and decreased knowledge of current deficits.        Exercises Exercises: General Lower Extremity General Exercises - Lower Extremity Straight Leg Raises: AROM;Both;10 reps;Seated Hip Flexion/Marching: AROM;Both;10 reps   Shoulder Instructions       General Comments SpO2 100% on 2L upon  entry. Desat to 92% with EOB activity and functional transfer. Rebounded to 98% within several minutes.    Pertinent Vitals/ Pain       Pain Assessment: No/denies pain  Home Living                                          Prior  Functioning/Environment              Frequency  Min 2X/week        Progress Toward Goals  OT Goals(current goals can now be found in the care plan section)  Progress towards OT goals: Progressing toward goals  Acute Rehab OT Goals Patient Stated Goal: To return home. OT Goal Formulation: With patient Time For Goal Achievement: 09/01/20 Potential to Achieve Goals: Good ADL Goals Pt Will Perform Grooming: with supervision;standing Pt Will Perform Upper Body Dressing: with set-up;sitting Pt Will Perform Lower Body Dressing: with supervision;sit to/from stand Pt Will Transfer to Toilet: with supervision;ambulating Pt Will Perform Toileting - Clothing Manipulation and hygiene: with supervision;sit to/from stand Pt Will Perform Tub/Shower Transfer: Shower transfer;ambulating;with supervision  Plan Discharge plan remains appropriate;Frequency remains appropriate    Co-evaluation                 AM-PAC OT "6 Clicks" Daily Activity     Outcome Measure   Help from another person eating meals?: None Help from another person taking care of personal grooming?: A Little Help from another person toileting, which includes using toliet, bedpan, or urinal?: A Little Help from another person bathing (including washing, rinsing, drying)?: A Lot Help from another person to put on and taking off regular upper body clothing?: A Little Help from another person to put on and taking off regular lower body clothing?: A Lot 6 Click Score: 17    End of Session Equipment Utilized During Treatment: Gait belt;Rolling walker;Oxygen  OT Visit Diagnosis: Unsteadiness on feet (R26.81);Other abnormalities of gait and mobility (R26.89);Muscle weakness (generalized) (M62.81);Other symptoms and signs involving cognitive function   Activity Tolerance Patient tolerated treatment well   Patient Left in chair;with call bell/phone within reach;with chair alarm set   Nurse Communication Mobility  status        Time: 1040-1100 OT Time Calculation (min): 20 min  Charges: OT General Charges $OT Visit: 1 Visit OT Treatments $Therapeutic Activity: 8-22 mins  Fernando Mcgurn H. OTR/L Supplemental OT, Department of rehab services [phone]    Felipe Drone 08/29/2020, 12:48 PM

## 2020-08-29 NOTE — Progress Notes (Signed)
Patient discharged via stretcher with [facility]. Patient hemodynamically stable during discharge.

## 2020-08-29 NOTE — Progress Notes (Signed)
Report called to nurse Hadley Pen at [facility]. Questions and concerns answered.

## 2020-09-01 ENCOUNTER — Emergency Department
Admission: EM | Admit: 2020-09-01 | Discharge: 2020-09-01 | Disposition: A | Discharge status: Home / Self Care | Payer: Medicare Other | Attending: Emergency Medicine | Admitting: Emergency Medicine

## 2020-09-01 ENCOUNTER — Emergency Department: Payer: Medicare Other

## 2020-09-01 ENCOUNTER — Encounter: Payer: Self-pay | Admitting: *Deleted

## 2020-09-01 ENCOUNTER — Other Ambulatory Visit: Payer: Self-pay

## 2020-09-01 DIAGNOSIS — F17210 Nicotine dependence, cigarettes, uncomplicated: Secondary | ICD-10-CM | POA: Diagnosis not present

## 2020-09-01 DIAGNOSIS — Z79899 Other long term (current) drug therapy: Secondary | ICD-10-CM | POA: Insufficient documentation

## 2020-09-01 DIAGNOSIS — I5043 Acute on chronic combined systolic (congestive) and diastolic (congestive) heart failure: Secondary | ICD-10-CM | POA: Diagnosis not present

## 2020-09-01 DIAGNOSIS — I110 Hypertensive heart disease with heart failure: Secondary | ICD-10-CM | POA: Insufficient documentation

## 2020-09-01 DIAGNOSIS — S0101XA Laceration without foreign body of scalp, initial encounter: Secondary | ICD-10-CM

## 2020-09-01 DIAGNOSIS — W19XXXA Unspecified fall, initial encounter: Secondary | ICD-10-CM

## 2020-09-01 DIAGNOSIS — S0990XA Unspecified injury of head, initial encounter: Secondary | ICD-10-CM | POA: Diagnosis present

## 2020-09-01 DIAGNOSIS — Z96611 Presence of right artificial shoulder joint: Secondary | ICD-10-CM | POA: Insufficient documentation

## 2020-09-01 MED ORDER — LIDOCAINE-EPINEPHRINE 2 %-1:100000 IJ SOLN
30.0000 mL | Freq: Once | INTRAMUSCULAR | Status: AC
  Administered 2020-09-01: 10 mL via INTRADERMAL
  Filled 2020-09-01: qty 2

## 2020-09-01 NOTE — ED Notes (Signed)
[facility]  CALLED FOR  TRANSPORT  TO  [facility] 

## 2020-09-01 NOTE — ED Triage Notes (Signed)
Arrives by [facility] s/p fall. A&O to baseline. Interactive, appropriate. H/o hepatic encephalopathy, COPD, CHF, COPD, HTN, and multiple falls. Witnessed fall, hit back of head. 3-5" lac posterior head, and L posterior shoulder abrasion. Multiple older wounds.   EMS reports: NSL 20g L AC, RBBB, cbg 147, c/o HA, denies neck or other pain, or other sx. 96/54, 95% on 2L, on home O2 2L. No Blood thinners.

## 2020-09-01 NOTE — ED Notes (Signed)
To CT, resting, no changes

## 2020-09-01 NOTE — ED Provider Notes (Signed)
[facility] Emergency Department Provider Note   ____________________________________________    I have reviewed the triage vital signs and the nursing notes.   HISTORY  Chief Complaint Fall  Patient is unable to give significant   HPI Fernando [name] is a 65 y.o. male with history as noted below who has had frequent falls presents today after a witnessed fall, with head injury.  He is reportedly at his baseline.  He is not on blood thinners.  No other injuries reported  Past Medical History:  Diagnosis Date  . Alcohol dependence ([place])   . Arthritis   . Cataract   . Chronic systolic (congestive) heart failure (HCC)    EF 25-30% in 10/2019  . Dyslipidemia   . Essential hypertension   . History of kidney stones   . Hyperlipidemia   . Hypertension   . Pneumonia   . Prosthetic shoulder infection, initial encounter [facility])     Patient Active Problem List   Diagnosis Date Noted  . Alcoholic cirrhosis of liver without ascites ([place])   . Acute GI bleeding 08/16/2020  . Abnormal CT of the abdomen 05/16/2020  . Pancreatic disease 05/16/2020  . Cyst of pancreas 05/16/2020  . Acute on chronic combined systolic and diastolic CHF (congestive heart failure) ([place]) 02/05/2020  . Anemia of chronic disease   . Alcoholic hepatitis without ascites   . Portal hypertensive gastropathy ([place])   . Pancreatic mass   . Sinus tachycardia   . AKI (acute kidney injury) ([place]) 01/27/2020  . Fall at home, initial encounter 01/26/2020  . Hypotension 01/26/2020  . Tobacco dependence 01/26/2020  . Dislocation of prosthetic shoulder joint, initial encounter ([place])   . Essential hypertension   . Dyslipidemia   . Alcohol dependence ([place])   . Pneumonitis   . Generalized weakness 10/25/2019  . Alcoholic hepatitis 09/26/7626  . Sinus tachycardia 10/25/2019  . Hyperammonemia ([place]) 10/25/2019  . Alcohol withdrawal ([place]) 10/25/2019  . Acute hypoxemic respiratory failure ([place])  10/25/2019  . Macrocytic anemia 10/25/2019  . Thrombocytopenia ([place]) 10/25/2019  . Hypertension   . Gout   . Tobacco abuse   . Stasis dermatitis of both legs 09/04/2018  . Medication monitoring encounter 08/09/2018  . 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 Rotator cuff tear arthropathy, right 06/21/2018    Past Surgical History:  Procedure Laterality Date  . BIOPSY  04/21/2020   Procedure: BIOPSY;  Surgeon: Rush Landmark Telford Nab., MD;  Location: [place];  Service: Gastroenterology;;  . BIOPSY  08/17/2020   Procedure: BIOPSY;  Surgeon: Carol Ada, MD;  Location: [place];  Service: Endoscopy;;  . COLONOSCOPY    . COLONOSCOPY     multiple  . ESOPHAGOGASTRODUODENOSCOPY (EGD) WITH PROPOFOL N/A 01/31/2020   Procedure: ESOPHAGOGASTRODUODENOSCOPY (EGD) WITH PROPOFOL;  Surgeon: Doran Stabler, MD;  Location: [place];  Service: Gastroenterology;  Laterality: N/A;  . ESOPHAGOGASTRODUODENOSCOPY (EGD) WITH PROPOFOL N/A 04/21/2020   Procedure: ESOPHAGOGASTRODUODENOSCOPY (EGD) WITH PROPOFOL;  Surgeon: Rush Landmark Telford Nab., MD;  Location: [place];  Service: Gastroenterology;  Laterality: N/A;  . ESOPHAGOGASTRODUODENOSCOPY (EGD) WITH PROPOFOL N/A 08/17/2020   Procedure: ESOPHAGOGASTRODUODENOSCOPY (EGD) WITH PROPOFOL;  Surgeon: Carol Ada, MD;  Location: [place];  Service: Endoscopy;  Laterality: N/A;  . EUS N/A 04/21/2020   Procedure: UPPER ENDOSCOPIC ULTRASOUND (EUS) LINEAR;  Surgeon: Irving Copas., MD;  Location: [place];  Service: Gastroenterology;  Laterality: N/A;  . HERNIA REPAIR  1990  . [place]  left men. repair  . KNEE SURGERY  1975  . REVERSE SHOULDER ARTHROPLASTY Right 06/21/2018   Procedure: REVERSE SHOULDER ARTHROPLASTY;  Surgeon: Hiram Gash, MD;  Location: [facility];  Service: Orthopedics;  Laterality: Right;  . ROTATOR CUFF REPAIR     right  . ROTATOR CUFF REPAIR    . TONSILLECTOMY    . TOTAL SHOULDER  REPLACEMENT  2020  . UMBILICAL HERNIA REPAIR      Prior to Admission medications   Medication Sig Start Date End Date Taking? Authorizing Provider  albuterol (PROVENTIL) (2.5 MG/3ML) 0.083% nebulizer solution Take 3 mLs (2.5 mg total) by nebulization every 6 (six) hours as needed for wheezing. 02/05/20   Debbe Odea, MD  allopurinol (ZYLOPRIM) 100 MG tablet Take 200 mg by mouth daily.  08/09/18   [provider]  arformoterol (BROVANA) 15 MCG/2ML NEBU Take 2 mLs (15 mcg total) by nebulization 2 (two) times daily. 08/29/20   Patrecia Pour, MD  budesonide (PULMICORT) 0.25 MG/2ML nebulizer solution Take 2 mLs (0.25 mg total) by nebulization 2 (two) times daily. 08/29/20   Patrecia Pour, MD  carvedilol (COREG) 3.125 MG tablet Take 1 tablet (3.125 mg total) by mouth 2 (two) times daily. Do not take is Systolic BP is <119 1/47/82   Pokhrel, Corrie Mckusick, MD  docusate sodium (COLACE) 100 MG capsule Take 1 capsule (100 mg total) by mouth 2 (two) times daily as needed for mild constipation. 08/25/20   Pokhrel, Corrie Mckusick, MD  folic acid (FOLVITE) 1 MG tablet Take 1 mg by mouth daily. 12/03/19   [provider]  furosemide (LASIX) 40 MG tablet Take 1 tablet (40 mg total) by mouth daily. 02/05/20 02/04/21  Debbe Odea, MD  guaiFENesin-dextromethorphan (ROBITUSSIN DM) 100-10 MG/5ML syrup Take 10 mLs by mouth every 6 (six) hours as needed for cough. 08/25/20   Pokhrel, Corrie Mckusick, MD  LORazepam (ATIVAN) 1 MG tablet Take 1 tablet (1 mg total) by mouth every 6 (six) hours as needed for anxiety. 08/29/20   Patrecia Pour, MD  losartan (COZAAR) 25 MG tablet Take 0.5 tablets (12.5 mg total) by mouth daily. DO not take if systolic blood pressure <956 08/25/20   Pokhrel, [place], MD  magnesium oxide (MAG-OX) 400 (240 Mg) MG tablet Take 1 tablet (400 mg total) by mouth 2 (two) times daily. 08/25/20 09/24/20  Pokhrel, Corrie Mckusick, MD  Multiple Vitamin (MULTIVITAMIN) tablet Take 1 tablet by mouth daily.     [provider]   nicotine (NICODERM CQ - DOSED IN MG/24 HOURS) 14 mg/24hr patch Place 1 patch (14 mg total) onto the skin daily. 08/26/20   Pokhrel, Corrie Mckusick, MD  pantoprazole (PROTONIX) 40 MG tablet Take 1 tablet (40 mg total) by mouth 2 (two) times daily for 14 days, THEN 1 tablet (40 mg total) daily. 08/25/20 11/17/20  Pokhrel, Corrie Mckusick, MD  revefenacin (YUPELRI) 175 MCG/3ML nebulizer solution Take 3 mLs (175 mcg total) by nebulization daily. 08/30/20   Patrecia Pour, MD  thiamine 100 MG tablet Take 1 tablet (100 mg total) by mouth daily. 08/26/20   Pokhrel, Corrie Mckusick, MD     Allergies Atorvastatin and Doxycycline  Family History  Problem Relation Age of Onset  . Heart failure Mother   . Lung cancer Father   . Colon cancer Neg Hx   . Esophageal cancer Neg Hx   . Stomach cancer Neg Hx   . Liver cancer Neg Hx   . Pancreatic cancer Neg Hx   . Rectal cancer Neg Hx   .  Inflammatory bowel disease Neg Hx     Social History Social History   Tobacco Use  . Smoking status: Current Every Day Smoker    Packs/day: 0.50    Years: 50.00    Pack years: 25.00  . Smokeless tobacco: Never Used  Vaping Use  . Vaping Use: Never used  Substance Use Topics  . Alcohol use: Not Currently    Alcohol/week: 14.0 standard drinks    Types: 14 Shots of liquor per week  . Drug use: Not Currently    Types: Marijuana    Review of Systems  Constitutional: No dizziness Eyes: No visual changes.  ENT: No facial injury Cardiovascular: No chest wall pain Respiratory: Denies shortness of breath. Gastrointestinal: No abdominal pain.     Musculoskeletal: No extremity injury Skin: Scalp laceration Neurological: No focal weakness   ____________________________________________   PHYSICAL EXAM:  VITAL SIGNS: ED Triage Vitals  Enc Vitals Group     BP 09/01/20 1336 114/66     Pulse Rate 09/01/20 1336 91     Resp 09/01/20 1336 18     Temp 09/01/20 1336 98 F (36.7 C)     Temp Source 09/01/20 1336 Oral     SpO2 09/01/20  1336 94 %     Weight 09/01/20 1337 73 kg (161 lb)     Height 09/01/20 1337 1.651 m (5\' 5" )     Head Circumference --      Peak Flow --      Pain Score 09/01/20 1336 0     Pain Loc --      Pain Edu? --      Excl. in [place]? --     Constitutional: [place] Eyes: Conjunctivae are normal.  Head: 7 cm vertical laceration posterior scalp, bleeding controlled Nose: No congestion/rhinnorhea. Mouth/Throat: Mucous membranes are moist.   Neck:  Painless ROM, no vertebral tenderness to palpation Cardiovascular: Normal rate, regular rhythm.   Good peripheral circulation.  No chest wall tenderness palpation Respiratory: Normal respiratory effort.  No retractions. Lungs CTAB. Gastrointestinal: Soft and nontender. No distention.    Musculoskeletal: No vertebral has palpation.  No pain with axial load on both hips, full range of motion of upper and lower extremities.  Warm and well perfused Neurologic:  Normal speech and language. No gross focal neurologic deficits are appreciated.  Skin:  Skin is warm, dry and intact. No rash noted. Psychiatric: Mood and affect are normal. Speech and behavior are normal.  ____________________________________________   LABS (all labs ordered are listed, but only abnormal results are displayed)  Labs Reviewed - No data to display ____________________________________________  EKG  None ____________________________________________  RADIOLOGY  CT head and cervical spine reviewed by me ____________________________________________   PROCEDURES  Procedure(s) performed: yes  Procedures   Critical Care performed: No ____________________________________________   INITIAL IMPRESSION / ASSESSMENT AND PLAN / ED COURSE  Pertinent labs & imaging results that were available during my care of the patient were reviewed by me and considered in my medical decision making (see chart for details).  Patient presents with witnessed reported mechanical fall not on blood  thinners.  Head laceration as detailed above.  Patient is at his baseline reportedly.  Pending CT head and cervical spine.  Will require laceration repair.  CT head and cervical spine are reassuring, laceration repaired, appropriate for discharge at this time    ____________________________________________   FINAL CLINICAL IMPRESSION(S) / ED DIAGNOSES  Final diagnoses:  Fall, initial encounter  Laceration of scalp, initial encounter  Note:  This document was prepared using Dragon voice recognition software and may include unintentional dictation errors.   Lavonia Drafts, MD 09/01/20 [phone]

## 2020-09-01 NOTE — ED Notes (Signed)
Pt resting. No changes. EDP at [facility] for scalp suturing.

## 2020-11-03 NOTE — Addendum Note (Signed)
Encounter addended by: Annie Paras on: 11/03/2020 6:00 PM  Actions taken: Letter saved

## 2020-12-18 ENCOUNTER — Emergency Department: Payer: Medicare Other

## 2020-12-18 ENCOUNTER — Inpatient Hospital Stay
Admission: EM | Admit: 2020-12-18 | Discharge: 2020-12-24 | DRG: 896 | Disposition: A | Discharge status: Home Health | Payer: Medicare Other | Attending: Internal Medicine | Admitting: Internal Medicine

## 2020-12-18 ENCOUNTER — Inpatient Hospital Stay: Payer: Medicare Other

## 2020-12-18 ENCOUNTER — Other Ambulatory Visit: Payer: Self-pay

## 2020-12-18 ENCOUNTER — Encounter: Payer: Self-pay | Admitting: Emergency Medicine

## 2020-12-18 DIAGNOSIS — K254 Chronic or unspecified gastric ulcer with hemorrhage: Secondary | ICD-10-CM | POA: Diagnosis present

## 2020-12-18 DIAGNOSIS — E785 Hyperlipidemia, unspecified: Secondary | ICD-10-CM | POA: Diagnosis present

## 2020-12-18 DIAGNOSIS — Z79899 Other long term (current) drug therapy: Secondary | ICD-10-CM

## 2020-12-18 DIAGNOSIS — Z96611 Presence of right artificial shoulder joint: Secondary | ICD-10-CM | POA: Diagnosis present

## 2020-12-18 DIAGNOSIS — Z8249 Family history of ischemic heart disease and other diseases of the circulatory system: Secondary | ICD-10-CM | POA: Diagnosis not present

## 2020-12-18 DIAGNOSIS — D6959 Other secondary thrombocytopenia: Secondary | ICD-10-CM | POA: Diagnosis present

## 2020-12-18 DIAGNOSIS — Z7951 Long term (current) use of inhaled steroids: Secondary | ICD-10-CM

## 2020-12-18 DIAGNOSIS — E872 Acidosis, unspecified: Secondary | ICD-10-CM

## 2020-12-18 DIAGNOSIS — E861 Hypovolemia: Secondary | ICD-10-CM | POA: Diagnosis present

## 2020-12-18 DIAGNOSIS — K209 Esophagitis, unspecified without bleeding: Secondary | ICD-10-CM | POA: Diagnosis not present

## 2020-12-18 DIAGNOSIS — F10239 Alcohol dependence with withdrawal, unspecified: Secondary | ICD-10-CM | POA: Diagnosis present

## 2020-12-18 DIAGNOSIS — F10230 Alcohol dependence with withdrawal, uncomplicated: Secondary | ICD-10-CM | POA: Diagnosis not present

## 2020-12-18 DIAGNOSIS — I10 Essential (primary) hypertension: Secondary | ICD-10-CM | POA: Diagnosis present

## 2020-12-18 DIAGNOSIS — K2100 Gastro-esophageal reflux disease with esophagitis, without bleeding: Secondary | ICD-10-CM | POA: Diagnosis present

## 2020-12-18 DIAGNOSIS — K7030 Alcoholic cirrhosis of liver without ascites: Secondary | ICD-10-CM | POA: Diagnosis present

## 2020-12-18 DIAGNOSIS — Z20822 Contact with and (suspected) exposure to covid-19: Secondary | ICD-10-CM | POA: Diagnosis present

## 2020-12-18 DIAGNOSIS — D62 Acute posthemorrhagic anemia: Secondary | ICD-10-CM | POA: Diagnosis present

## 2020-12-18 DIAGNOSIS — K766 Portal hypertension: Secondary | ICD-10-CM | POA: Diagnosis present

## 2020-12-18 DIAGNOSIS — K862 Cyst of pancreas: Secondary | ICD-10-CM | POA: Diagnosis present

## 2020-12-18 DIAGNOSIS — E871 Hypo-osmolality and hyponatremia: Secondary | ICD-10-CM | POA: Diagnosis present

## 2020-12-18 DIAGNOSIS — R0602 Shortness of breath: Secondary | ICD-10-CM

## 2020-12-18 DIAGNOSIS — Z888 Allergy status to other drugs, medicaments and biological substances status: Secondary | ICD-10-CM | POA: Diagnosis not present

## 2020-12-18 DIAGNOSIS — K922 Gastrointestinal hemorrhage, unspecified: Secondary | ICD-10-CM | POA: Diagnosis not present

## 2020-12-18 DIAGNOSIS — F1020 Alcohol dependence, uncomplicated: Secondary | ICD-10-CM | POA: Diagnosis present

## 2020-12-18 DIAGNOSIS — F17210 Nicotine dependence, cigarettes, uncomplicated: Secondary | ICD-10-CM | POA: Diagnosis present

## 2020-12-18 DIAGNOSIS — K253 Acute gastric ulcer without hemorrhage or perforation: Secondary | ICD-10-CM | POA: Diagnosis not present

## 2020-12-18 DIAGNOSIS — E876 Hypokalemia: Secondary | ICD-10-CM | POA: Diagnosis present

## 2020-12-18 DIAGNOSIS — K921 Melena: Secondary | ICD-10-CM | POA: Diagnosis not present

## 2020-12-18 DIAGNOSIS — I110 Hypertensive heart disease with heart failure: Secondary | ICD-10-CM | POA: Diagnosis present

## 2020-12-18 DIAGNOSIS — K3189 Other diseases of stomach and duodenum: Secondary | ICD-10-CM | POA: Diagnosis present

## 2020-12-18 DIAGNOSIS — J690 Pneumonitis due to inhalation of food and vomit: Secondary | ICD-10-CM | POA: Diagnosis not present

## 2020-12-18 DIAGNOSIS — D696 Thrombocytopenia, unspecified: Secondary | ICD-10-CM | POA: Diagnosis not present

## 2020-12-18 DIAGNOSIS — E440 Moderate protein-calorie malnutrition: Secondary | ICD-10-CM | POA: Diagnosis present

## 2020-12-18 DIAGNOSIS — Z881 Allergy status to other antibiotic agents status: Secondary | ICD-10-CM | POA: Diagnosis not present

## 2020-12-18 DIAGNOSIS — R062 Wheezing: Secondary | ICD-10-CM

## 2020-12-18 DIAGNOSIS — I5032 Chronic diastolic (congestive) heart failure: Secondary | ICD-10-CM | POA: Diagnosis present

## 2020-12-18 DIAGNOSIS — E8729 Other acidosis: Secondary | ICD-10-CM | POA: Diagnosis present

## 2020-12-18 DIAGNOSIS — Z6822 Body mass index (BMI) 22.0-22.9, adult: Secondary | ICD-10-CM

## 2020-12-18 DIAGNOSIS — A419 Sepsis, unspecified organism: Secondary | ICD-10-CM

## 2020-12-18 LAB — CBC WITH DIFFERENTIAL/PLATELET
Abs Immature Granulocytes: 0.1 10*3/uL — ABNORMAL HIGH (ref 0.00–0.07)
Basophils Absolute: 0 10*3/uL (ref 0.0–0.1)
Basophils Relative: 0 %
Eosinophils Absolute: 0 10*3/uL (ref 0.0–0.5)
Eosinophils Relative: 0 %
HCT: 32.4 % — ABNORMAL LOW (ref 39.0–52.0)
Hemoglobin: 11.1 g/dL — ABNORMAL LOW (ref 13.0–17.0)
Immature Granulocytes: 1 %
Lymphocytes Relative: 9 %
Lymphs Abs: 0.9 10*3/uL (ref 0.7–4.0)
MCH: 37.2 pg — ABNORMAL HIGH (ref 26.0–34.0)
MCHC: 34.3 g/dL (ref 30.0–36.0)
MCV: 108.7 fL — ABNORMAL HIGH (ref 80.0–100.0)
Monocytes Absolute: 0.4 10*3/uL (ref 0.1–1.0)
Monocytes Relative: 5 %
Neutro Abs: 8 10*3/uL — ABNORMAL HIGH (ref 1.7–7.7)
Neutrophils Relative %: 85 %
Platelets: 103 10*3/uL — ABNORMAL LOW (ref 150–400)
RBC: 2.98 MIL/uL — ABNORMAL LOW (ref 4.22–5.81)
RDW: 15.3 % (ref 11.5–15.5)
WBC: 9.4 10*3/uL (ref 4.0–10.5)
nRBC: 0 % (ref 0.0–0.2)

## 2020-12-18 LAB — URINALYSIS, ROUTINE W REFLEX MICROSCOPIC
Bacteria, UA: NONE SEEN
Bilirubin Urine: NEGATIVE
Glucose, UA: NEGATIVE mg/dL
Ketones, ur: 80 mg/dL — AB
Leukocytes,Ua: NEGATIVE
Nitrite: NEGATIVE
Protein, ur: 100 mg/dL — AB
Specific Gravity, Urine: 1.018 (ref 1.005–1.030)
Squamous Epithelial / HPF: NONE SEEN (ref 0–5)
pH: 6 (ref 5.0–8.0)

## 2020-12-18 LAB — BLOOD GAS, ARTERIAL
Acid-Base Excess: 3.2 mmol/L — ABNORMAL HIGH (ref 0.0–2.0)
Bicarbonate: 26.1 mmol/L (ref 20.0–28.0)
FIO2: 0.36
O2 Saturation: 95.4 %
Patient temperature: 37
pCO2 arterial: 32 mmHg (ref 32.0–48.0)
pH, Arterial: 7.52 — ABNORMAL HIGH (ref 7.350–7.450)
pO2, Arterial: 69 mmHg — ABNORMAL LOW (ref 83.0–108.0)

## 2020-12-18 LAB — URINE DRUG SCREEN, QUALITATIVE (ARMC ONLY)
Amphetamines, Ur Screen: NOT DETECTED
Barbiturates, Ur Screen: NOT DETECTED
Benzodiazepine, Ur Scrn: NOT DETECTED
Cannabinoid 50 Ng, Ur ~~LOC~~: NOT DETECTED
Cocaine Metabolite,Ur ~~LOC~~: NOT DETECTED
MDMA (Ecstasy)Ur Screen: NOT DETECTED
Methadone Scn, Ur: NOT DETECTED
Opiate, Ur Screen: NOT DETECTED
Phencyclidine (PCP) Ur S: NOT DETECTED
Tricyclic, Ur Screen: NOT DETECTED

## 2020-12-18 LAB — COMPREHENSIVE METABOLIC PANEL
ALT: 68 U/L — ABNORMAL HIGH (ref 0–44)
AST: 105 U/L — ABNORMAL HIGH (ref 15–41)
Albumin: 4 g/dL (ref 3.5–5.0)
Alkaline Phosphatase: 163 U/L — ABNORMAL HIGH (ref 38–126)
Anion gap: 27 — ABNORMAL HIGH (ref 5–15)
BUN: 21 mg/dL (ref 8–23)
CO2: 14 mmol/L — ABNORMAL LOW (ref 22–32)
Calcium: 8.3 mg/dL — ABNORMAL LOW (ref 8.9–10.3)
Chloride: 90 mmol/L — ABNORMAL LOW (ref 98–111)
Creatinine, Ser: 0.93 mg/dL (ref 0.61–1.24)
GFR, Estimated: >60 mL/min (ref >60)
Glucose, Bld: 113 mg/dL — ABNORMAL HIGH (ref 70–99)
Potassium: 3.9 mmol/L (ref 3.5–5.1)
Sodium: 131 mmol/L — ABNORMAL LOW (ref 135–145)
Total Bilirubin: 3 mg/dL — ABNORMAL HIGH (ref 0.3–1.2)
Total Protein: 7.4 g/dL (ref 6.5–8.1)

## 2020-12-18 LAB — TROPONIN I (HIGH SENSITIVITY)
Troponin I (High Sensitivity): 23 ng/L — ABNORMAL HIGH (ref <18)
Troponin I (High Sensitivity): 24 ng/L — ABNORMAL HIGH (ref <18)

## 2020-12-18 LAB — LACTIC ACID, PLASMA
Lactic Acid, Venous: 6.1 mmol/L (ref 0.5–1.9)
Lactic Acid, Venous: 4.9 mmol/L (ref 0.5–1.9)
Lactic Acid, Venous: 2 mmol/L (ref 0.5–1.9)
Lactic Acid, Venous: 1.5 mmol/L (ref 0.5–1.9)

## 2020-12-18 LAB — PROTIME-INR
INR: 1.1 (ref 0.8–1.2)
Prothrombin Time: 14.2 seconds (ref 11.4–15.2)

## 2020-12-18 LAB — CK: Total CK: 123 U/L (ref 49–397)

## 2020-12-18 LAB — RESP PANEL BY RT-PCR (FLU A&B, COVID) ARPGX2
Influenza A by PCR: NEGATIVE
Influenza B by PCR: NEGATIVE
SARS Coronavirus 2 by RT PCR: NEGATIVE

## 2020-12-18 LAB — ETHANOL: Alcohol, Ethyl (B): 32 mg/dL — ABNORMAL HIGH (ref <10)

## 2020-12-18 LAB — LIPASE, BLOOD: Lipase: 133 U/L — ABNORMAL HIGH (ref 11–51)

## 2020-12-18 LAB — MAGNESIUM: Magnesium: 1.7 mg/dL (ref 1.7–2.4)

## 2020-12-18 MED ORDER — LOSARTAN POTASSIUM 25 MG PO TABS
12.5000 mg | ORAL_TABLET | Freq: Every day | ORAL | Status: DC
  Administered 2020-12-18: 12.5 mg via ORAL
  Filled 2020-12-18: qty 0.5

## 2020-12-18 MED ORDER — ACETAMINOPHEN 650 MG RE SUPP: 650.0000 mg | Freq: Four times a day (QID) | RECTAL | Status: DC | PRN

## 2020-12-18 MED ORDER — DOCUSATE SODIUM 100 MG PO CAPS: 100.0000 mg | ORAL_CAPSULE | Freq: Two times a day (BID) | ORAL | Status: DC | PRN

## 2020-12-18 MED ORDER — SODIUM CHLORIDE 0.9 % IV BOLUS
1000.0000 mL | Freq: Once | INTRAVENOUS | Status: AC
  Administered 2020-12-18: 1000 mL via INTRAVENOUS

## 2020-12-18 MED ORDER — LORAZEPAM 2 MG PO TABS: 0.0000 mg | ORAL_TABLET | Freq: Two times a day (BID) | ORAL | Status: DC

## 2020-12-18 MED ORDER — LORAZEPAM 2 MG/ML IJ SOLN
1.0000 mg | Freq: Once | INTRAMUSCULAR | Status: AC
  Administered 2020-12-18: 1 mg via INTRAVENOUS
  Filled 2020-12-18: qty 1

## 2020-12-18 MED ORDER — LORAZEPAM 1 MG PO TABS: 1.0000 mg | ORAL_TABLET | ORAL | Status: DC | PRN

## 2020-12-18 MED ORDER — ALBUTEROL SULFATE (2.5 MG/3ML) 0.083% IN NEBU: 2.5000 mg | INHALATION_SOLUTION | Freq: Four times a day (QID) | RESPIRATORY_TRACT | Status: DC | PRN

## 2020-12-18 MED ORDER — ALLOPURINOL 100 MG PO TABS
100.0000 mg | ORAL_TABLET | Freq: Every day | ORAL | Status: DC
  Administered 2020-12-18 – 2020-12-24 (×5): 100 mg via ORAL
  Filled 2020-12-18 (×8): qty 1

## 2020-12-18 MED ORDER — LORAZEPAM 2 MG PO TABS
0.0000 mg | ORAL_TABLET | Freq: Four times a day (QID) | ORAL | Status: DC
  Administered 2020-12-18 – 2020-12-19 (×2): 2 mg via ORAL
  Administered 2020-12-20: 1 mg via ORAL
  Filled 2020-12-18 (×4): qty 1

## 2020-12-18 MED ORDER — LORAZEPAM 2 MG/ML IJ SOLN
1.0000 mg | INTRAMUSCULAR | Status: DC | PRN
  Administered 2020-12-19 – 2020-12-20 (×4): 2 mg via INTRAVENOUS
  Filled 2020-12-18 (×4): qty 1

## 2020-12-18 MED ORDER — THIAMINE HCL 100 MG/ML IJ SOLN
100.0000 mg | Freq: Once | INTRAMUSCULAR | Status: AC
  Administered 2020-12-18: 100 mg via INTRAVENOUS

## 2020-12-18 MED ORDER — FUROSEMIDE 40 MG PO TABS
40.0000 mg | ORAL_TABLET | Freq: Every day | ORAL | Status: DC
  Administered 2020-12-18: 40 mg via ORAL
  Filled 2020-12-18: qty 1

## 2020-12-18 MED ORDER — SODIUM CHLORIDE 0.9 % IV SOLN
2.0000 g | Freq: Once | INTRAVENOUS | Status: AC
  Administered 2020-12-18: 2 g via INTRAVENOUS
  Filled 2020-12-18: qty 20

## 2020-12-18 MED ORDER — ADULT MULTIVITAMIN W/MINERALS CH
1.0000 | ORAL_TABLET | Freq: Every day | ORAL | Status: DC
  Administered 2020-12-18 – 2020-12-24 (×6): 1 via ORAL
  Filled 2020-12-18 (×6): qty 1

## 2020-12-18 MED ORDER — ACETAMINOPHEN 325 MG PO TABS
650.0000 mg | ORAL_TABLET | Freq: Four times a day (QID) | ORAL | Status: DC | PRN
  Administered 2020-12-21: 650 mg via ORAL
  Filled 2020-12-18: qty 2

## 2020-12-18 MED ORDER — NICOTINE 14 MG/24HR TD PT24
14.0000 mg | MEDICATED_PATCH | Freq: Every day | TRANSDERMAL | Status: DC | PRN
  Administered 2020-12-20: 14 mg via TRANSDERMAL
  Filled 2020-12-18: qty 1

## 2020-12-18 MED ORDER — THIAMINE HCL 100 MG/ML IJ SOLN: 100.0000 mg | Freq: Every day | INTRAMUSCULAR | Status: DC

## 2020-12-18 MED ORDER — MAGNESIUM SULFATE 2 GM/50ML IV SOLN
2.0000 g | Freq: Once | INTRAVENOUS | Status: AC
  Administered 2020-12-18: 2 g via INTRAVENOUS
  Filled 2020-12-18: qty 50

## 2020-12-18 MED ORDER — THIAMINE HCL 100 MG/ML IJ SOLN
100.0000 mg | Freq: Once | INTRAMUSCULAR | Status: DC
  Filled 2020-12-18: qty 2

## 2020-12-18 MED ORDER — ENOXAPARIN SODIUM 40 MG/0.4ML IJ SOSY
40.0000 mg | PREFILLED_SYRINGE | INTRAMUSCULAR | Status: DC
  Filled 2020-12-18: qty 0.4

## 2020-12-18 MED ORDER — SODIUM CHLORIDE 0.9 % IV SOLN: Freq: Once | INTRAVENOUS | Status: AC

## 2020-12-18 MED ORDER — THIAMINE HCL 100 MG/ML IJ SOLN: Freq: Once | INTRAVENOUS | Status: DC

## 2020-12-18 MED ORDER — ONDANSETRON HCL 4 MG/2ML IJ SOLN: 4.0000 mg | Freq: Four times a day (QID) | INTRAMUSCULAR | Status: DC | PRN

## 2020-12-18 MED ORDER — ONDANSETRON HCL 4 MG PO TABS: 4.0000 mg | ORAL_TABLET | Freq: Four times a day (QID) | ORAL | Status: DC | PRN

## 2020-12-18 MED ORDER — SODIUM CHLORIDE 0.9 % IV SOLN
500.0000 mg | Freq: Once | INTRAVENOUS | Status: AC
  Administered 2020-12-18: 500 mg via INTRAVENOUS
  Filled 2020-12-18: qty 500

## 2020-12-18 MED ORDER — SODIUM CHLORIDE 0.9 % IV BOLUS
250.0000 mL | Freq: Once | INTRAVENOUS | Status: AC
  Administered 2020-12-18: 250 mL via INTRAVENOUS

## 2020-12-18 MED ORDER — CARVEDILOL 3.125 MG PO TABS
3.1250 mg | ORAL_TABLET | Freq: Two times a day (BID) | ORAL | Status: DC
  Administered 2020-12-18 – 2020-12-21 (×7): 3.125 mg via ORAL
  Filled 2020-12-18 (×7): qty 1

## 2020-12-18 MED ORDER — SODIUM CHLORIDE 0.9 % IV SOLN
1.0000 mg | Freq: Once | INTRAVENOUS | Status: AC
  Administered 2020-12-18: 1 mg via INTRAVENOUS
  Filled 2020-12-18: qty 0.2

## 2020-12-18 MED ORDER — IOHEXOL 350 MG/ML SOLN
100.0000 mL | Freq: Once | INTRAVENOUS | Status: AC | PRN
  Administered 2020-12-18: 100 mL via INTRAVENOUS

## 2020-12-18 NOTE — ED Triage Notes (Signed)
EMS brings pt in from home; Pt to triage via w/c with no distress noted; pt reports weakness since last night; denies any accomp symptoms, denies any pain

## 2020-12-18 NOTE — Plan of Care (Signed)

## 2020-12-18 NOTE — ED Notes (Signed)
Pt presenting with active fine tremors, HR 100-120 fluctuating.  EDP aware.

## 2020-12-18 NOTE — ED Notes (Signed)
Critical lactic of 6.1 called in from lab. EDP made aware.

## 2020-12-18 NOTE — ED Notes (Signed)
RT notified of impending order for ABG at this time.

## 2020-12-18 NOTE — Sepsis Progress Note (Signed)
[facility] is tracking the Code Sepsis. 

## 2020-12-18 NOTE — ED Provider Notes (Signed)
Fernando [name]  ____________________________________________   Event Date/Time   First MD Initiated Contact with Patient 12/18/20 [phone]     (approximate)  I have reviewed the triage vital signs and the nursing notes.   HISTORY  Chief Complaint Weakness    HPI Fernando [name] is a 65 y.o. male here with generalized weakness.  The patient states that over the last 24 hours, he has developed progressively worsening, generalized weakness.  He states that his symptoms started last night.  He tried to get out of bed and fell to the ground because his legs would not support himself.  He called EMS and was sent back to the bed.  When tried to get up this morning, he felt too weak to do this.  He states he has had associated general fatigue, cough, and mild shortness of breath.  Denies any pain.  No falls prior to the episode overnight.  He does not believe he hit his head.  He is not on blood thinners.  Reports she has had decreased appetite.  He does admit to fairly regular, heavy alcohol use.  Denies any abdominal pain, nausea, vomiting, or diarrhea.  No known sick contacts.  No known COVID exposures.      Past Medical History:  Diagnosis Date   Alcohol dependence ([place])    Arthritis    Cataract    Chronic systolic (congestive) heart failure (HCC)    EF 25-30% in 10/2019   Dyslipidemia    Essential hypertension    History of kidney stones    Hyperlipidemia    Hypertension    Pneumonia    Prosthetic shoulder infection, initial encounter [facility])     Patient Active Problem List   Diagnosis Date Noted   Alcoholic ketoacidosis [id number]   Alcoholic cirrhosis of liver without ascites ([place])    Acute GI bleeding 08/16/2020   Abnormal CT of the abdomen 05/16/2020   Pancreatic disease 05/16/2020   Cyst of pancreas 05/16/2020   Acute on chronic combined systolic and diastolic CHF (congestive heart failure) ([place]) 02/05/2020   Anemia  of chronic disease    Alcoholic hepatitis without ascites    Portal hypertensive gastropathy (HCC)    Pancreatic mass    Sinus tachycardia    AKI (acute kidney injury) ([place]) 01/27/2020   Fall at home, initial encounter 01/26/2020   Hypotension 01/26/2020   Tobacco dependence 01/26/2020   Dislocation of prosthetic shoulder joint, initial encounter ([place])    Essential hypertension    Dyslipidemia    Alcohol dependence ([place])    Pneumonitis    Generalized weakness [id number]   Alcoholic hepatitis [id number]   Sinus tachycardia 10/25/2019   Hyperammonemia ([place]) 10/25/2019   Alcohol withdrawal ([place]) 10/25/2019   Acute hypoxemic respiratory failure ([place]) 10/25/2019   Macrocytic anemia 10/25/2019   Thrombocytopenia ([place]) 10/25/2019   Hypertension    Gout    Tobacco abuse    Stasis dermatitis of both legs 09/04/2018   Medication monitoring encounter 08/09/2018   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   Rotator cuff tear arthropathy, right 06/21/2018    Past Surgical History:  Procedure Laterality Date   BIOPSY  04/21/2020   Procedure: BIOPSY;  Surgeon: Irving Copas., MD;  Location: [place];  Service: Gastroenterology;;   BIOPSY  08/17/2020   Procedure: BIOPSY;  Surgeon: Carol Ada, MD;  Location: [place];  Service: Endoscopy;;   COLONOSCOPY     COLONOSCOPY  multiple   ESOPHAGOGASTRODUODENOSCOPY (EGD) WITH PROPOFOL N/A 01/31/2020   Procedure: ESOPHAGOGASTRODUODENOSCOPY (EGD) WITH PROPOFOL;  Surgeon: Doran Stabler, MD;  Location: [place];  Service: Gastroenterology;  Laterality: N/A;   ESOPHAGOGASTRODUODENOSCOPY (EGD) WITH PROPOFOL N/A 04/21/2020   Procedure: ESOPHAGOGASTRODUODENOSCOPY (EGD) WITH PROPOFOL;  Surgeon: Rush Landmark Telford Nab., MD;  Location: [place];  Service: Gastroenterology;  Laterality: N/A;   ESOPHAGOGASTRODUODENOSCOPY (EGD) WITH PROPOFOL N/A 08/17/2020   Procedure: ESOPHAGOGASTRODUODENOSCOPY (EGD) WITH  PROPOFOL;  Surgeon: Carol Ada, MD;  Location: [place];  Service: Endoscopy;  Laterality: N/A;   EUS N/A 04/21/2020   Procedure: UPPER ENDOSCOPIC ULTRASOUND (EUS) LINEAR;  Surgeon: Irving Copas., MD;  Location: [place];  Service: Gastroenterology;  Laterality: N/A;   [place]   left men. repair   KNEE SURGERY  1975   REVERSE SHOULDER ARTHROPLASTY Right 06/21/2018   Procedure: REVERSE SHOULDER ARTHROPLASTY;  Surgeon: Hiram Gash, MD;  Location: [facility];  Service: Orthopedics;  Laterality: Right;   ROTATOR CUFF REPAIR     right   ROTATOR CUFF REPAIR     TONSILLECTOMY     TOTAL SHOULDER REPLACEMENT  [id number]   UMBILICAL HERNIA REPAIR      Prior to Admission medications   Medication Sig Start Date End Date Taking? Authorizing Provider  albuterol (PROVENTIL) (2.5 MG/3ML) 0.083% nebulizer solution Take 3 mLs (2.5 mg total) by nebulization every 6 (six) hours as needed for wheezing. 02/05/20   Debbe Odea, MD  allopurinol (ZYLOPRIM) 100 MG tablet Take 200 mg by mouth daily.  08/09/18   [provider]  arformoterol (BROVANA) 15 MCG/2ML NEBU Take 2 mLs (15 mcg total) by nebulization 2 (two) times daily. 08/29/20   Patrecia Pour, MD  budesonide (PULMICORT) 0.25 MG/2ML nebulizer solution Take 2 mLs (0.25 mg total) by nebulization 2 (two) times daily. 08/29/20   Patrecia Pour, MD  carvedilol (COREG) 3.125 MG tablet Take 1 tablet (3.125 mg total) by mouth 2 (two) times daily. Do not take is Systolic BP is [id number] [id number]   Pokhrel, Corrie Mckusick, MD  docusate sodium (COLACE) 100 MG capsule Take 1 capsule (100 mg total) by mouth 2 (two) times daily as needed for mild constipation. 08/25/20   Pokhrel, Corrie Mckusick, MD  famotidine (PEPCID) 40 MG tablet Take 1 tablet by mouth at bedtime.    [provider]  folic acid (FOLVITE) 1 MG tablet Take 1 mg by mouth daily. 12/03/19   [provider]  furosemide (LASIX) 40 MG tablet Take 1 tablet (40 mg  total) by mouth daily. 02/05/20 02/04/21  Debbe Odea, MD  guaiFENesin-dextromethorphan (ROBITUSSIN DM) 100-10 MG/5ML syrup Take 10 mLs by mouth every 6 (six) hours as needed for cough. 08/25/20   Pokhrel, Corrie Mckusick, MD  LORazepam (ATIVAN) 1 MG tablet Take 1 tablet (1 mg total) by mouth every 6 (six) hours as needed for anxiety. 08/29/20   Patrecia Pour, MD  losartan (COZAAR) 25 MG tablet Take 0.5 tablets (12.5 mg total) by mouth daily. DO not take if systolic blood pressure [id number] 08/25/20   Pokhrel, L[place]man, MD  Multiple Vitamin (MULTIVITAMIN) tablet Take 1 tablet by mouth daily.     [provider]  nicotine (NICODERM CQ - DOSED IN MG/24 HOURS) 14 mg/24hr patch Place 1 patch (14 mg total) onto the skin daily. 08/26/20   Pokhrel, Corrie Mckusick, MD  pantoprazole (PROTONIX) 40 MG tablet Take 1 tablet (40 mg total) by mouth 2 (two) times daily  for 14 days, THEN 1 tablet (40 mg total) daily. 08/25/20 11/17/20  Pokhrel, Corrie Mckusick, MD  revefenacin (YUPELRI) 175 MCG/3ML nebulizer solution Take 3 mLs (175 mcg total) by nebulization daily. 08/30/20   Patrecia Pour, MD  thiamine 100 MG tablet Take 1 tablet (100 mg total) by mouth daily. 08/26/20   Pokhrel, Corrie Mckusick, MD    Allergies Atorvastatin and Doxycycline  Family History  Problem Relation Age of Onset   Heart failure Mother    Lung cancer Father    Colon cancer Neg Hx    Esophageal cancer Neg Hx    Stomach cancer Neg Hx    Liver cancer Neg Hx    Pancreatic cancer Neg Hx    Rectal cancer Neg Hx    Inflammatory bowel disease Neg Hx     Social History Social History   Tobacco Use   Smoking status: Every Day    Packs/day: 0.50    Years: 50.00    Pack years: 25.00    Types: Cigarettes   Smokeless tobacco: Never  Vaping Use   Vaping Use: Never used  Substance Use Topics   Alcohol use: Yes    Alcohol/week: 14.0 standard drinks    Types: 14 Shots of liquor per week   Drug use: Not Currently    Types: Marijuana    Review of Systems  Review of  Systems  Constitutional:  Positive for fatigue. Negative for chills and fever.  HENT:  Negative for sore throat.   Respiratory:  Positive for cough and shortness of breath.   Cardiovascular:  Negative for chest pain.  Gastrointestinal:  Negative for abdominal pain.  Genitourinary:  Negative for flank pain.  Musculoskeletal:  Negative for neck pain.  Skin:  Negative for rash and wound.  Allergic/Immunologic: Negative for immunocompromised state.  Neurological:  Positive for weakness. Negative for numbness.  Hematological:  Does not bruise/bleed easily.  All other systems reviewed and are negative.   ____________________________________________  PHYSICAL EXAM:      VITAL SIGNS: ED Triage Vitals  Enc Vitals Group     BP 12/18/20 0255 134/80     Pulse Rate 12/18/20 0255 (!) 118     Resp 12/18/20 0255 18     Temp 12/18/20 0255 98 F (36.7 C)     Temp Source 12/18/20 0255 Oral     SpO2 12/18/20 0255 98 %     Weight 12/18/20 0251 165 lb (74.8 kg)     Height 12/18/20 0251 '5\' 5"'$  (1.651 m)     Head Circumference --      Peak Flow --      Pain Score 12/18/20 0251 0     Pain Loc --      Pain Edu? --      Excl. in [place]? --      Physical Exam Vitals and nursing [name] reviewed.  Constitutional:      General: He is not in acute distress.    Appearance: He is well-developed.  HENT:     Head: Normocephalic and atraumatic.  Eyes:     Conjunctiva/sclera: Conjunctivae normal.  Cardiovascular:     Rate and Rhythm: Regular rhythm. Tachycardia present.     Heart sounds: Normal heart sounds. No murmur heard.   No friction rub.  Pulmonary:     Effort: Pulmonary effort is normal. No respiratory distress.     Breath sounds: Wheezing ([place]) and rhonchi ([place]asilar) present. No rales.  Abdominal:     General: There is no distension.  Palpations: Abdomen is soft.     Tenderness: There is no abdominal tenderness.  Musculoskeletal:     Cervical back: Neck supple.  Skin:    General:  Skin is warm.     Capillary Refill: Capillary refill takes less than 2 seconds.  Neurological:     Mental Status: He is alert and oriented to person, place, and time.     Motor: No abnormal muscle tone.     Comments: Mild bilateral tremor noted      ____________________________________________   LABS (all labs ordered are listed, but only abnormal results are displayed)  Labs Reviewed  CBC WITH DIFFERENTIAL/PLATELET - Abnormal; Notable for the following components:      Result Value   RBC 2.98 (*)    Hemoglobin 11.1 (*)    HCT 32.4 (*)    MCV 108.7 (*)    MCH 37.2 (*)    Platelets 103 (*)    Neutro Abs 8.0 (*)    Abs Immature Granulocytes 0.10 (*)    All other components within normal limits  COMPREHENSIVE METABOLIC PANEL - Abnormal; Notable for the following components:   Sodium 131 (*)    Chloride 90 (*)    CO2 14 (*)    Glucose, Bld 113 (*)    Calcium 8.3 (*)    AST 105 (*)    ALT 68 (*)    Alkaline Phosphatase 163 (*)    Total Bilirubin 3.0 (*)    Anion gap 27 (*)    All other components within normal limits  URINALYSIS, ROUTINE W REFLEX MICROSCOPIC - Abnormal; Notable for the following components:   Color, Urine AMBER (*)    APPearance HAZY (*)    Hgb urine dipstick MODERATE (*)    Ketones, ur 80 (*)    Protein, ur 100 (*)    All other components within normal limits  ETHANOL - Abnormal; Notable for the following components:   Alcohol, Ethyl (B) 32 (*)    All other components within normal limits  BLOOD GAS, VENOUS - Abnormal; Notable for the following components:   pCO2, Ven 26 (*)    Bicarbonate 12.8 (*)    Acid-base deficit 12.1 (*)    All other components within normal limits  LACTIC ACID, PLASMA - Abnormal; Notable for the following components:   Lactic Acid, Venous 6.1 (*)    All other components within normal limits  LACTIC ACID, PLASMA - Abnormal; Notable for the following components:   Lactic Acid, Venous 4.9 (*)    All other components within  normal limits  LIPASE, BLOOD - Abnormal; Notable for the following components:   Lipase 133 (*)    All other components within normal limits  TROPONIN I (HIGH SENSITIVITY) - Abnormal; Notable for the following components:   Troponin I (High Sensitivity) 23 (*)    All other components within normal limits  TROPONIN I (HIGH SENSITIVITY) - Abnormal; Notable for the following components:   Troponin I (High Sensitivity) 24 (*)    All other components within normal limits  RESP PANEL BY RT-PCR (FLU A&B, COVID) ARPGX2  CULTURE, BLOOD (SINGLE)  CULTURE, BLOOD (SINGLE)  URINE DRUG SCREEN, QUALITATIVE ([facility] ONLY)  MAGNESIUM  PROTIME-INR  CK  AMMONIA    ____________________________________________  EKG: Sinus tachycardia, ventricular rate 125.  PR 120, QRS 142, QTc 542.  Prolonged QT.  No acute ST elevations or depressions.  Right bundle branch block noted. ________________________________________  RADIOLOGY All imaging, including plain films, CT scans, and  ultrasounds, independently reviewed by me, and interpretations confirmed via formal radiology reads.  ED MD interpretation:   Chest x-ray: Left basilar pneumonia CT head: No acute intracranial abnormality  Official radiology report(s): CT HEAD WO CONTRAST (5MM)  Result Date: 12/18/2020 CLINICAL DATA:  Mental status change with unknown cause EXAM: CT HEAD WITHOUT CONTRAST TECHNIQUE: Contiguous axial images were obtained from the base of the skull through the vertex without intravenous contrast. COMPARISON:  09/01/2020 FINDINGS: Brain: No evidence of acute infarction, hemorrhage, hydrocephalus, extra-axial collection or mass lesion/mass effect. Cerebral volume loss which is generalized. Vascular: No hyperdense vessel or unexpected calcification. Skull: Normal. Negative for fracture or focal lesion. Sinuses/Orbits: Mild opacification in the right maxillary sinus. Bilateral cataract resection Other: No acute finding. IMPRESSION: 1. No acute  finding. 2. Generalized brain atrophy. Electronically Signed   By: Jorje Guild M.D.   On: 12/18/2020 09:09   CT ABDOMEN PELVIS W CONTRAST  Result Date: 12/18/2020 CLINICAL DATA:  Nausea, vomiting, abdominal pain EXAM: CT ABDOMEN AND PELVIS WITH CONTRAST TECHNIQUE: Multidetector CT imaging of the abdomen and pelvis was performed using the standard protocol following bolus administration of intravenous contrast. CONTRAST:  161m OMNIPAQUE IOHEXOL 350 MG/ML SOLN COMPARISON:  05/01/2020 FINDINGS: Lower chest: Coronary artery and aortic calcifications. No acute abnormality. Hepatobiliary: Diffuse low-density throughout the liver compatible with fatty infiltration. No focal abnormality. Gallbladder unremarkable. Pancreas: 3.3 cm cyst in the pancreatic tail compared to 4 cm previously. No ductal dilatation. Spleen: No focal abnormality.  Normal size. Adrenals/Urinary Tract: Bilateral renal cysts, the largest on the right measures 2.6 cm. No hydronephrosis. No stones. Adrenal glands and urinary bladder unremarkable. Stomach/Bowel: Normal appendix. Stomach, large and small bowel grossly unremarkable. Vascular/Lymphatic: Aortic atherosclerosis. No evidence of aneurysm or adenopathy. Reproductive: No visible focal abnormality. Other: No free fluid or free air. Musculoskeletal: Diffuse degenerative disc and facet disease. No acute bony abnormality. IMPRESSION: Hepatic steatosis. 3.3 cm cyst in the pancreatic tail compared to 4.1 cm previously. This may reflect a pseudo cyst, but continued follow-up recommended. Repeat CT in 6-12 months suggested. Aortic atherosclerosis, coronary artery disease. Electronically Signed   By: KRolm BaptiseM.D.   On: 12/18/2020 11:05   DG Chest Portable 1 View  Result Date: 12/18/2020 CLINICAL DATA:  Weakness, cough EXAM: PORTABLE CHEST 1 VIEW COMPARISON:  Chest radiograph 08/29/2020 FINDINGS: The cardiomediastinal silhouette is stable, allowing for slight rightward patient rotation.  There are patchy opacities in the lung bases, left worse than right. The left basilar opacities are new/increased compared to the prior study. The upper lungs are well aerated. There is no pleural effusion. There is no pneumothorax. Right shoulder arthroplasty hardware is noted. IMPRESSION: Patchy opacities in the left base are increased from prior, concerning for infection in the correct clinical setting. Recommend follow-up radiographs in 6-8 weeks to assess for resolution. Electronically Signed   By: PValetta MoleM.D.   On: 12/18/2020 08:58    ____________________________________________  PROCEDURES   Procedure(s) performed (including Critical Care):  .Critical Care Performed by: IDuffy Bruce MD Authorized by: IDuffy Bruce MD   Critical care provider statement:    Critical care time (minutes):  35   Critical care time was exclusive of:  Separately billable procedures and treating other patients and teaching time   Critical care was necessary to treat or prevent imminent or life-threatening deterioration of the following conditions:  Circulatory failure, cardiac failure, respiratory failure and sepsis   Critical care was time spent personally by me on the following activities:  Development of treatment plan with patient or surrogate, discussions with consultants, evaluation of patient's response to treatment, examination of patient, obtaining history from patient or surrogate, ordering and performing treatments and interventions, ordering and review of laboratory studies, ordering and review of radiographic studies, pulse oximetry, re-evaluation of patient's condition and review of old charts   I assumed direction of critical care for this patient from another provider in my specialty: no    ____________________________________________  INITIAL IMPRESSION / MDM / [place] / ED COURSE  As part of my medical decision making, I reviewed the following data within the  [place] notes reviewed and incorporated, Old chart reviewed, Notes from prior ED visits, and [place] Controlled Substance Database       *Fernando [name] was evaluated in Emergency Department on 12/18/2020 for the symptoms described in the history of present illness. He was evaluated in the context of the global COVID-19 pandemic, which necessitated consideration that the patient might be at risk for infection with the SARS-CoV-2 virus that causes COVID-19. Institutional protocols and algorithms that pertain to the evaluation of patients at risk for COVID-19 are in a state of rapid change based on information released by regulatory bodies including the CDC and federal and state organizations. These policies and algorithms were followed during the patient's care in the ED.  Some ED evaluations and interventions may be delayed as a result of limited staffing during the pandemic.*     Medical Decision Making:  65 year old male here with generalized weakness and inability to ambulate.  On arrival, patient is tachycardic and appears unwell but nontoxic.  Initial lab work shows profound acidosis with bicarb of 14 and what I suspect is mild alcoholic transaminitis.  Suspect alcoholic ketoacidosis with anion gap of 27.  Lactic acid is 6.1, which I suspect is combined secondary to dehydration, infection, as well as type II lactic acidosis in the setting of his cirrhosis and decreased liver clearance.  He has no hypotension or signs of hypoperfusion.  He does have significant ketonuria consistent with dehydration.  On exam, patient has bilateral rhonchi and slight wheezing.  Chest x-ray shows basilar pneumonia.  Patient will be started on empiric antibiotics, fluids, and admit to medicine.  Blood cultures and urine culture has been sent.  CT abdomen and pelvis obtained given his mild transaminitis, reviewed by me and shows no evidence of acute intra-abdominal  pathology.  ____________________________________________  FINAL CLINICAL IMPRESSION(S) / ED DIAGNOSES  Final diagnoses:  Sepsis due to pneumonia ([place])  Alcoholic cirrhosis, unspecified whether ascites present ([place])  Lactic acidosis     MEDICATIONS GIVEN DURING THIS VISIT:  Medications  azithromycin (ZITHROMAX) 500 mg in sodium chloride 0.9 % 250 mL IVPB (500 mg Intravenous New Bag/Given 12/18/20 1109)  sodium chloride 0.9 % bolus 250 mL (has no administration in time range)  LORazepam (ATIVAN) injection 1 mg (has no administration in time range)  thiamine (B-1) injection 100 mg (has no administration in time range)  sodium chloride 0.9 % bolus 1,000 mL (0 mLs Intravenous Stopped 12/18/20 1110)  LORazepam (ATIVAN) injection 1 mg (1 mg Intravenous Given 12/18/20 0914)  cefTRIAXone (ROCEPHIN) 2 g in sodium chloride 0.9 % 100 mL IVPB (0 g Intravenous Stopped 12/18/20 1110)  sodium chloride 0.9 % bolus 1,000 mL (1,000 mLs Intravenous New Bag/Given 12/18/20 1110)  iohexol (OMNIPAQUE) 350 MG/ML injection 100 mL (100 mLs Intravenous Contrast Given 12/18/20 1033)     ED Discharge Orders  None        [name]:  This document was prepared using Dragon voice recognition software and may include unintentional dictation errors.   Duffy Bruce, MD 12/18/20 1149

## 2020-12-18 NOTE — Sepsis Progress Note (Signed)
Notified bedside nurse of need to draw repeat lactic acid after fluid bolus is complete.

## 2020-12-18 NOTE — ED Notes (Addendum)
Pt assisted onto bedpan.

## 2020-12-18 NOTE — ED Notes (Signed)
Pt in CT at this time.

## 2020-12-18 NOTE — Consult Note (Signed)
CODE SEPSIS - PHARMACY COMMUNICATION  **Broad Spectrum Antibiotics should be administered within 1 hour of Sepsis diagnosis**  Time Code Sepsis Called/Page Received: 1015  Antibiotics Ordered: 0915 (CTX/Azith)  Time of 1st antibiotic administration: 0938 (CTX)  Additional action taken by pharmacy: N/A  If necessary, Name of Provider/Nurse Contacted: N/A  Lorna Dibble ,PharmD Clinical Pharmacist  12/18/2020  10:16 AM

## 2020-12-18 NOTE — ED Notes (Signed)
At approx 1748 this RN noted pt's O2 was in 70's on the monitor and HR was elevated. This RN placed pt on 2L [place], O2 increased to 85%, this RN increased O2 to 4L [place] and O2 increased to 94%. MD Agbata made aware, RN Nira Conn made aware.

## 2020-12-18 NOTE — H&P (Signed)
History and Physical    Fernando [name] [id number] DOB: 10/30/1955 DOA: 12/18/2020  PCP: [name], MD   Patient coming from: Home  I have personally briefly reviewed patient's old medical records in [place]  Chief Complaint: Weakness  HPI: Fernando [name] is a 65 y.o. male with medical history significant for chronic diastolic CHF (EF 123456 in 08/2020);  ETOH dependence with history of DTs, history of prosthetic shoulder joint infection; HTN; and HLD who presents to the ER by EMS for evaluation of weakness. Patient states that he fell in the bathroom and was unable to get up due to generalized weakness.  He called into his dad and called EMS who came and assisted him back into his chair.  He attempted to get out of his chair early this morning but was unable to because he felt his legs were very weak and so he called EMS and was brought to the ER. He admits to daily alcohol use and admits to having symptoms of alcohol withdrawal when he does not drink.  He has a productive cough but is unable to tell me the color of the phlegm. His oral intake has been poor and he denies feeling dizzy or lightheaded.  He denies having any abdominal pain, no changes in his bowel habits, no headache, no urinary symptoms, no fever, no chills, no palpitations, no diaphoresis, no blurred vision no focal deficit. Venous blood gas 7.30/26/12.8/51.5 Labs show sodium 131, potassium 3.9, chloride 90, bicarb 14, glucose 113, BUN 21, creatinine 0.93, calcium 8.3, magnesium 1.7, alkaline phosphatase 163, albumin 4.0, lipase 133, AST 105, ALT 68, total protein 7.4, total CK1 23, lactic acid 6.1 >> 4.9, white count 9.4, hemoglobin 11.1, hematocrit 32.4, MCV 108.7, RDW 15.3, platelet count 103, PT 14.2, INR 1.1 Respiratory viral panel is negative Chest x-ray reviewed by me shows patchy opacities in the left base are increased from prior, concerning for infection in the correct clinical setting. Recommend  follow-up radiographs in 6-8 weeks to assess for resolution. CT scan of abdomen and pelvis shows hepatic steatosis.  3.3 cm cyst in the pancreatic tail compared to 4.1 cm previously.  This may reflect a pseudocyst.  Repeat CT in 6 to 12 months No acute abnormality noted in the lower chest CT scan of the head without contrast shows no acute finding.  Generalized brain atrophy. Twelve-lead EKG reviewed by me shows sinus tachycardia with a right bundle branch block.    ED Course: Patient is a 65 year old male with a history of alcohol abuse who presents to the ER via EMS for evaluation of generalized weakness and a fall at home. Labs reveal an anion gap metabolic acidosis as well as lactic acidosis. He is tachycardic and has tremors and is at high risk for going into the delirium tremens. He will be admitted to the hospital for further evaluation.     Review of Systems: As per HPI otherwise all other systems reviewed and negative.    Past Medical History:  Diagnosis Date   Alcohol dependence ([place])    Arthritis    Cataract    Chronic systolic (congestive) heart failure (HCC)    EF 25-30% in 10/2019   Dyslipidemia    Essential hypertension    History of kidney stones    Hyperlipidemia    Hypertension    Pneumonia    Prosthetic shoulder infection, initial encounter [facility])     Past Surgical History:  Procedure Laterality Date   BIOPSY  04/21/2020  Procedure: BIOPSY;  Surgeon: Irving Copas., MD;  Location: [facility] ENDOSCOPY;  Service: Gastroenterology;;   BIOPSY  08/17/2020   Procedure: BIOPSY;  Surgeon: Carol Ada, MD;  Location: [place];  Service: Endoscopy;;   COLONOSCOPY     COLONOSCOPY     multiple   ESOPHAGOGASTRODUODENOSCOPY (EGD) WITH PROPOFOL N/A 01/31/2020   Procedure: ESOPHAGOGASTRODUODENOSCOPY (EGD) WITH PROPOFOL;  Surgeon: Doran Stabler, MD;  Location: [place];  Service: Gastroenterology;  Laterality: N/A;   ESOPHAGOGASTRODUODENOSCOPY (EGD) WITH  PROPOFOL N/A 04/21/2020   Procedure: ESOPHAGOGASTRODUODENOSCOPY (EGD) WITH PROPOFOL;  Surgeon: Rush Landmark Telford Nab., MD;  Location: [place];  Service: Gastroenterology;  Laterality: N/A;   ESOPHAGOGASTRODUODENOSCOPY (EGD) WITH PROPOFOL N/A 08/17/2020   Procedure: ESOPHAGOGASTRODUODENOSCOPY (EGD) WITH PROPOFOL;  Surgeon: Carol Ada, MD;  Location: [place];  Service: Endoscopy;  Laterality: N/A;   EUS N/A 04/21/2020   Procedure: UPPER ENDOSCOPIC ULTRASOUND (EUS) LINEAR;  Surgeon: Irving Copas., MD;  Location: [place];  Service: Gastroenterology;  Laterality: N/A;   [place]   left men. repair   KNEE SURGERY  1975   REVERSE SHOULDER ARTHROPLASTY Right 06/21/2018   Procedure: REVERSE SHOULDER ARTHROPLASTY;  Surgeon: Hiram Gash, MD;  Location: [facility];  Service: Orthopedics;  Laterality: Right;   ROTATOR CUFF REPAIR     right   ROTATOR CUFF REPAIR     TONSILLECTOMY     TOTAL SHOULDER REPLACEMENT  [id number]   UMBILICAL HERNIA REPAIR       reports that he has been smoking cigarettes. He has a 25.00 pack-year smoking history. He has never used smokeless tobacco. He reports current alcohol use of about 14.0 standard drinks per week. He reports that he does not currently use drugs after having used the following drugs: Marijuana.  Allergies  Allergen Reactions   Atorvastatin     Other reaction(s): joints ache   Doxycycline     Other reaction(s): rash/hive    Family History  Problem Relation Age of Onset   Heart failure Mother    Lung cancer Father    Colon cancer Neg Hx    Esophageal cancer Neg Hx    Stomach cancer Neg Hx    Liver cancer Neg Hx    Pancreatic cancer Neg Hx    Rectal cancer Neg Hx    Inflammatory bowel disease Neg Hx       Prior to Admission medications   Medication Sig Start Date End Date Taking? Authorizing Provider  albuterol (PROVENTIL) (2.5 MG/3ML) 0.083% nebulizer solution Take 3 mLs (2.5 mg total) by  nebulization every 6 (six) hours as needed for wheezing. 02/05/20   Debbe Odea, MD  allopurinol (ZYLOPRIM) 100 MG tablet Take 200 mg by mouth daily.  08/09/18   [provider]  arformoterol (BROVANA) 15 MCG/2ML NEBU Take 2 mLs (15 mcg total) by nebulization 2 (two) times daily. 08/29/20   Patrecia Pour, MD  budesonide (PULMICORT) 0.25 MG/2ML nebulizer solution Take 2 mLs (0.25 mg total) by nebulization 2 (two) times daily. 08/29/20   Patrecia Pour, MD  carvedilol (COREG) 3.125 MG tablet Take 1 tablet (3.125 mg total) by mouth 2 (two) times daily. Do not take is Systolic BP is 123456 [id number]   Pokhrel, Corrie Mckusick, MD  docusate sodium (COLACE) 100 MG capsule Take 1 capsule (100 mg total) by mouth 2 (two) times daily as needed for mild constipation. 08/25/20   Pokhrel, Corrie Mckusick, MD  folic acid (FOLVITE) 1 MG  tablet Take 1 mg by mouth daily. 12/03/19   [provider]  furosemide (LASIX) 40 MG tablet Take 1 tablet (40 mg total) by mouth daily. 02/05/20 02/04/21  Debbe Odea, MD  guaiFENesin-dextromethorphan (ROBITUSSIN DM) 100-10 MG/5ML syrup Take 10 mLs by mouth every 6 (six) hours as needed for cough. 08/25/20   Pokhrel, Corrie Mckusick, MD  LORazepam (ATIVAN) 1 MG tablet Take 1 tablet (1 mg total) by mouth every 6 (six) hours as needed for anxiety. 08/29/20   Patrecia Pour, MD  losartan (COZAAR) 25 MG tablet Take 0.5 tablets (12.5 mg total) by mouth daily. DO not take if systolic blood pressure [id number] 08/25/20   Pokhrel, Laxman, MD  Multiple Vitamin (MULTIVITAMIN) tablet Take 1 tablet by mouth daily.     [provider]  nicotine (NICODERM CQ - DOSED IN MG/24 HOURS) 14 mg/24hr patch Place 1 patch (14 mg total) onto the skin daily. 08/26/20   Pokhrel, Corrie Mckusick, MD  pantoprazole (PROTONIX) 40 MG tablet Take 1 tablet (40 mg total) by mouth 2 (two) times daily for 14 days, THEN 1 tablet (40 mg total) daily. 08/25/20 11/17/20  Pokhrel, Corrie Mckusick, MD  revefenacin (YUPELRI) 175 MCG/3ML nebulizer solution Take 3 mLs  (175 mcg total) by nebulization daily. 08/30/20   Patrecia Pour, MD  thiamine 100 MG tablet Take 1 tablet (100 mg total) by mouth daily. 08/26/20   Flora Lipps, MD    Physical Exam: Vitals:   12/18/20 0900 12/18/20 0930 12/18/20 1000 12/18/20 1118  BP: (!) 147/90 (!) 154/65 (!) 147/69 (!) 145/82  Pulse: (!) 115 89 (!) 101 (!) 134  Resp: 16 17 (!) 23 (!) 24  Temp:      TempSrc:      SpO2: 95% 95% 96% 97%  Weight:      Height:         Vitals:   12/18/20 0900 12/18/20 0930 12/18/20 1000 12/18/20 1118  BP: (!) 147/90 (!) 154/65 (!) 147/69 (!) 145/82  Pulse: (!) 115 89 (!) 101 (!) 134  Resp: 16 17 (!) 23 (!) 24  Temp:      TempSrc:      SpO2: 95% 95% 96% 97%  Weight:      Height:          Constitutional: Alert and oriented x 3 . Not in any apparent distress.  HEENT:      Head: Normocephalic and atraumatic.         Eyes: PERLA, EOMI, Conjunctivae are normal. Sclera is non-icteric.       Mouth/Throat: Mucous membranes are moist.       Neck: Supple with no signs of meningismus. Cardiovascular: Tachycardic. No murmurs, gallops, or rubs. 2+ symmetrical distal pulses are present . No JVD. No LE edema Respiratory: Respiratory effort normal .Lungs sounds clear bilaterally. No wheezes, crackles, or rhonchi.  Gastrointestinal: Soft, non tender, and non distended with positive bowel sounds.  Central adiposity Genitourinary: No CVA tenderness. Musculoskeletal: Nontender with normal range of motion in all extremities. No cyanosis, or erythema of extremities. Neurologic:  Face is symmetric. Moving all extremities. No gross focal neurologic deficits . Skin: Skin is warm, dry.  No rash or ulcers Psychiatric: Mood and affect are normal    Labs on Admission: I have personally reviewed following labs and imaging studies  CBC: Recent Labs  Lab 12/18/20 0307  WBC 9.4  NEUTROABS 8.0*  HGB 11.1*  HCT 32.4*  MCV 108.7*  PLT [id number]*   Basic Metabolic Panel: Recent  Labs  Lab  12/18/20 0307 12/18/20 0920  NA 131*  --   K 3.9  --   CL 90*  --   CO2 14*  --   GLUCOSE 113*  --   BUN 21  --   CREATININE 0.93  --   CALCIUM 8.3*  --   MG  --  1.7   GFR: Estimated Creatinine Clearance: 72.8 mL/min (by C-G formula based on SCr of 0.93 mg/dL). Liver Function Tests: Recent Labs  Lab 12/18/20 0307  AST 105*  ALT 68*  ALKPHOS 163*  BILITOT 3.0*  PROT 7.4  ALBUMIN 4.0   Recent Labs  Lab 12/18/20 0920  LIPASE 133*   No results for input(s): AMMONIA in the last 168 hours. Coagulation Profile: Recent Labs  Lab 12/18/20 0920  INR 1.1   Cardiac Enzymes: Recent Labs  Lab 12/18/20 0920  CKTOTAL 123   BNP (last 3 results) No results for input(s): PROBNP in the last 8760 hours. HbA1C: No results for input(s): HGBA1C in the last 72 hours. CBG: No results for input(s): GLUCAP in the last 168 hours. Lipid Profile: No results for input(s): CHOL, HDL, LDLCALC, TRIG, CHOLHDL, LDLDIRECT in the last 72 hours. Thyroid Function Tests: No results for input(s): TSH, T4TOTAL, FREET4, T3FREE, THYROIDAB in the last 72 hours. Anemia Panel: No results for input(s): VITAMINB12, FOLATE, FERRITIN, TIBC, IRON, RETICCTPCT in the last 72 hours. Urine analysis:    Component Value Date/Time   COLORURINE AMBER (A) 12/18/2020 0817   APPEARANCEUR HAZY (A) 12/18/2020 0817   LABSPEC 1.018 12/18/2020 0817   PHURINE 6.0 12/18/2020 0817   GLUCOSEU NEGATIVE 12/18/2020 0817   HGBUR MODERATE (A) 12/18/2020 0817   BILIRUBINUR NEGATIVE 12/18/2020 0817   KETONESUR 80 (A) 12/18/2020 0817   PROTEINUR 100 (A) 12/18/2020 0817   UROBILINOGEN 1.0 07/21/2008 0647   NITRITE NEGATIVE 12/18/2020 0817   LEUKOCYTESUR NEGATIVE 12/18/2020 0817    Radiological Exams on Admission: CT HEAD WO CONTRAST (5MM)  Result Date: 12/18/2020 CLINICAL DATA:  Mental status change with unknown cause EXAM: CT HEAD WITHOUT CONTRAST TECHNIQUE: Contiguous axial images were obtained from the base of the  skull through the vertex without intravenous contrast. COMPARISON:  09/01/2020 FINDINGS: Brain: No evidence of acute infarction, hemorrhage, hydrocephalus, extra-axial collection or mass lesion/mass effect. Cerebral volume loss which is generalized. Vascular: No hyperdense vessel or unexpected calcification. Skull: Normal. Negative for fracture or focal lesion. Sinuses/Orbits: Mild opacification in the right maxillary sinus. Bilateral cataract resection Other: No acute finding. IMPRESSION: 1. No acute finding. 2. Generalized brain atrophy. Electronically Signed   By: Jorje Guild M.D.   On: 12/18/2020 09:09   CT ABDOMEN PELVIS W CONTRAST  Result Date: 12/18/2020 CLINICAL DATA:  Nausea, vomiting, abdominal pain EXAM: CT ABDOMEN AND PELVIS WITH CONTRAST TECHNIQUE: Multidetector CT imaging of the abdomen and pelvis was performed using the standard protocol following bolus administration of intravenous contrast. CONTRAST:  156m OMNIPAQUE IOHEXOL 350 MG/ML SOLN COMPARISON:  05/01/2020 FINDINGS: Lower chest: Coronary artery and aortic calcifications. No acute abnormality. Hepatobiliary: Diffuse low-density throughout the liver compatible with fatty infiltration. No focal abnormality. Gallbladder unremarkable. Pancreas: 3.3 cm cyst in the pancreatic tail compared to 4 cm previously. No ductal dilatation. Spleen: No focal abnormality.  Normal size. Adrenals/Urinary Tract: Bilateral renal cysts, the largest on the right measures 2.6 cm. No hydronephrosis. No stones. Adrenal glands and urinary bladder unremarkable. Stomach/Bowel: Normal appendix. Stomach, large and small bowel grossly unremarkable. Vascular/Lymphatic: Aortic atherosclerosis. No evidence of aneurysm or  adenopathy. Reproductive: No visible focal abnormality. Other: No free fluid or free air. Musculoskeletal: Diffuse degenerative disc and facet disease. No acute bony abnormality. IMPRESSION: Hepatic steatosis. 3.3 cm cyst in the pancreatic tail compared  to 4.1 cm previously. This may reflect a pseudo cyst, but continued follow-up recommended. Repeat CT in 6-12 months suggested. Aortic atherosclerosis, coronary artery disease. Electronically Signed   By: Rolm Baptise M.D.   On: 12/18/2020 11:05   DG Chest Portable 1 View  Result Date: 12/18/2020 CLINICAL DATA:  Weakness, cough EXAM: PORTABLE CHEST 1 VIEW COMPARISON:  Chest radiograph 08/29/2020 FINDINGS: The cardiomediastinal silhouette is stable, allowing for slight rightward patient rotation. There are patchy opacities in the lung bases, left worse than right. The left basilar opacities are new/increased compared to the prior study. The upper lungs are well aerated. There is no pleural effusion. There is no pneumothorax. Right shoulder arthroplasty hardware is noted. IMPRESSION: Patchy opacities in the left base are increased from prior, concerning for infection in the correct clinical setting. Recommend follow-up radiographs in 6-8 weeks to assess for resolution. Electronically Signed   By: Valetta Mole M.D.   On: 12/18/2020 08:58     Assessment/Plan Principal Problem:   Alcoholic ketoacidosis Active Problems:   Essential hypertension   Alcohol dependence (HCC)   Alcoholic cirrhosis of liver without ascites (HCC)   Chronic diastolic CHF (congestive heart failure) ([place])     Alcoholic ketoacidosis Patient presents to the ER for evaluation of generalized weakness as well as a fall at home and is found to have an anion gap metabolic acidosis and elevated lactic acid level as well Metabolic acidosis secondary to alcoholic ketoacidosis related to poor oral intake Will hydrate patient Encourage p.o. intake Trend lactic acid levels     Generalized weakness/status post fall Place patient on fall precautions PT evaluation    Chronic diastolic dysfunction CHF Continue carvedilol, furosemide and losartan    Alcohol abuse with complications of alcoholic cirrhosis of liver without  ascites Patient at increased risk for going into Dts Place patient on CIWA protocol and administer lorazepam for CIWA score of 8 or greater Place patient on MVI, thiamine and folic acid       DVT prophylaxis: SCDs Code Status: full code  Family Communication: Greater than 50% of time was spent discussing patient's condition and plan of care with him at the bedside.  All questions and concerns have been addressed.  He verbalizes understanding and agrees with the plan.  CODE STATUS was discussed and he is a full code. Disposition Plan: Back to previous home environment Consults called: Physical therapy Status: At the time of admission, it appears that the appropriate admission status for this patient is inpatient. This is judged to be reasonable and necessary To provide the required intensity of service to ensure the patient's safety given the presenting symptoms, physical exam findings, and initial radiographic and laboratory data in the context of the comorbid conditions. Patient requires inpatient status due to high intensity of service, high risk for further deterioration and high frequency of surveillance required.    Collier Bullock MD [facility]     12/18/2020, 12:58 PM

## 2020-12-18 NOTE — ED Notes (Signed)
No BM at this time. Pt appreciative for assistance. Repositioned in bed

## 2020-12-19 DIAGNOSIS — K862 Cyst of pancreas: Secondary | ICD-10-CM | POA: Diagnosis not present

## 2020-12-19 DIAGNOSIS — K7030 Alcoholic cirrhosis of liver without ascites: Secondary | ICD-10-CM

## 2020-12-19 DIAGNOSIS — D696 Thrombocytopenia, unspecified: Secondary | ICD-10-CM

## 2020-12-19 DIAGNOSIS — K922 Gastrointestinal hemorrhage, unspecified: Secondary | ICD-10-CM

## 2020-12-19 DIAGNOSIS — D62 Acute posthemorrhagic anemia: Secondary | ICD-10-CM | POA: Diagnosis not present

## 2020-12-19 DIAGNOSIS — K921 Melena: Secondary | ICD-10-CM | POA: Diagnosis not present

## 2020-12-19 DIAGNOSIS — E876 Hypokalemia: Secondary | ICD-10-CM | POA: Diagnosis not present

## 2020-12-19 DIAGNOSIS — E872 Acidosis: Secondary | ICD-10-CM | POA: Diagnosis not present

## 2020-12-19 DIAGNOSIS — E440 Moderate protein-calorie malnutrition: Secondary | ICD-10-CM | POA: Insufficient documentation

## 2020-12-19 LAB — BASIC METABOLIC PANEL
Anion gap: 13 (ref 5–15)
Anion gap: 16 — ABNORMAL HIGH (ref 5–15)
BUN: 20 mg/dL (ref 8–23)
BUN: 16 mg/dL (ref 8–23)
CO2: 28 mmol/L (ref 22–32)
CO2: 26 mmol/L (ref 22–32)
Calcium: 8.5 mg/dL — ABNORMAL LOW (ref 8.9–10.3)
Calcium: 8.7 mg/dL — ABNORMAL LOW (ref 8.9–10.3)
Chloride: 87 mmol/L — ABNORMAL LOW (ref 98–111)
Chloride: 88 mmol/L — ABNORMAL LOW (ref 98–111)
Creatinine, Ser: 0.74 mg/dL (ref 0.61–1.24)
Creatinine, Ser: 0.7 mg/dL (ref 0.61–1.24)
GFR, Estimated: >60 mL/min (ref >60)
GFR, Estimated: >60 mL/min (ref >60)
Glucose, Bld: 129 mg/dL — ABNORMAL HIGH (ref 70–99)
Glucose, Bld: 87 mg/dL (ref 70–99)
Potassium: 2.8 mmol/L — ABNORMAL LOW (ref 3.5–5.1)
Potassium: 3.3 mmol/L — ABNORMAL LOW (ref 3.5–5.1)
Sodium: 128 mmol/L — ABNORMAL LOW (ref 135–145)
Sodium: 130 mmol/L — ABNORMAL LOW (ref 135–145)

## 2020-12-19 LAB — CBC
HCT: 20.9 % — ABNORMAL LOW (ref 39.0–52.0)
Hemoglobin: 7.6 g/dL — ABNORMAL LOW (ref 13.0–17.0)
MCH: 37.6 pg — ABNORMAL HIGH (ref 26.0–34.0)
MCHC: 36.4 g/dL — ABNORMAL HIGH (ref 30.0–36.0)
MCV: 103.5 fL — ABNORMAL HIGH (ref 80.0–100.0)
Platelets: 44 10*3/uL — ABNORMAL LOW (ref 150–400)
RBC: 2.02 MIL/uL — ABNORMAL LOW (ref 4.22–5.81)
RDW: 14.6 % (ref 11.5–15.5)
WBC: 4.5 10*3/uL (ref 4.0–10.5)
nRBC: 0 % (ref 0.0–0.2)

## 2020-12-19 LAB — HEMOGLOBIN AND HEMATOCRIT, BLOOD
HCT: 28.5 % — ABNORMAL LOW (ref 39.0–52.0)
Hemoglobin: 10.3 g/dL — ABNORMAL LOW (ref 13.0–17.0)

## 2020-12-19 LAB — PREPARE RBC (CROSSMATCH)

## 2020-12-19 LAB — PROTIME-INR
INR: 1 (ref 0.8–1.2)
Prothrombin Time: 13.6 seconds (ref 11.4–15.2)

## 2020-12-19 LAB — AMMONIA: Ammonia: 35 umol/L (ref 9–35)

## 2020-12-19 LAB — MAGNESIUM: Magnesium: 1.7 mg/dL (ref 1.7–2.4)

## 2020-12-19 LAB — APTT: aPTT: 28 seconds (ref 24–36)

## 2020-12-19 LAB — OCCULT BLOOD X 1 CARD TO LAB, STOOL: Fecal Occult Bld: POSITIVE — AB

## 2020-12-19 MED ORDER — THIAMINE HCL 100 MG/ML IJ SOLN
100.0000 mg | INTRAMUSCULAR | Status: DC
  Administered 2020-12-20 – 2020-12-22 (×3): 100 mg via INTRAVENOUS
  Filled 2020-12-19 (×3): qty 2

## 2020-12-19 MED ORDER — ASCORBIC ACID 500 MG PO TABS
500.0000 mg | ORAL_TABLET | Freq: Two times a day (BID) | ORAL | Status: DC
  Administered 2020-12-19 – 2020-12-24 (×9): 500 mg via ORAL
  Filled 2020-12-19 (×10): qty 1

## 2020-12-19 MED ORDER — PANTOPRAZOLE SODIUM 40 MG IV SOLR
40.0000 mg | Freq: Two times a day (BID) | INTRAVENOUS | Status: DC
  Administered 2020-12-19 – 2020-12-23 (×8): 40 mg via INTRAVENOUS
  Filled 2020-12-19 (×9): qty 40

## 2020-12-19 MED ORDER — OCTREOTIDE LOAD VIA INFUSION
50.0000 ug | Freq: Once | INTRAVENOUS | Status: AC
  Administered 2020-12-19: 50 ug via INTRAVENOUS
  Filled 2020-12-19: qty 25

## 2020-12-19 MED ORDER — POTASSIUM CHLORIDE 10 MEQ/100ML IV SOLN
10.0000 meq | INTRAVENOUS | Status: AC
  Administered 2020-12-19 (×4): 10 meq via INTRAVENOUS
  Filled 2020-12-19 (×4): qty 100

## 2020-12-19 MED ORDER — FUROSEMIDE 10 MG/ML IJ SOLN
20.0000 mg | Freq: Once | INTRAMUSCULAR | Status: AC
  Administered 2020-12-19: 20 mg via INTRAVENOUS
  Filled 2020-12-19: qty 2

## 2020-12-19 MED ORDER — SODIUM CHLORIDE 0.9% IV SOLUTION: Freq: Once | INTRAVENOUS | Status: DC

## 2020-12-19 MED ORDER — POTASSIUM CHLORIDE CRYS ER 20 MEQ PO TBCR
40.0000 meq | EXTENDED_RELEASE_TABLET | Freq: Once | ORAL | Status: AC
  Administered 2020-12-19: 40 meq via ORAL
  Filled 2020-12-19: qty 4

## 2020-12-19 MED ORDER — PANTOPRAZOLE 80MG IVPB - SIMPLE MED
80.0000 mg | Freq: Once | INTRAVENOUS | Status: AC
  Administered 2020-12-19: 80 mg via INTRAVENOUS
  Filled 2020-12-19: qty 80

## 2020-12-19 MED ORDER — BOOST / RESOURCE BREEZE PO LIQD CUSTOM
1.0000 | Freq: Three times a day (TID) | ORAL | Status: DC
  Administered 2020-12-19 – 2020-12-23 (×4): 1 via ORAL

## 2020-12-19 MED ORDER — SODIUM CHLORIDE 0.9% IV SOLUTION: Freq: Once | INTRAVENOUS | Status: AC

## 2020-12-19 MED ORDER — PANTOPRAZOLE SODIUM 40 MG IV SOLR: 40.0000 mg | Freq: Two times a day (BID) | INTRAVENOUS | Status: DC

## 2020-12-19 MED ORDER — PANTOPRAZOLE INFUSION (NEW) - SIMPLE MED
8.0000 mg/h | INTRAVENOUS | Status: DC
  Administered 2020-12-19: 8 mg/h via INTRAVENOUS
  Filled 2020-12-19: qty 80

## 2020-12-19 MED ORDER — SODIUM CHLORIDE 0.9 % IV SOLN: INTRAVENOUS | Status: AC

## 2020-12-19 MED ORDER — SODIUM CHLORIDE 0.9 % IV SOLN
50.0000 ug/h | INTRAVENOUS | Status: DC
  Administered 2020-12-19 – 2020-12-20 (×3): 50 ug/h via INTRAVENOUS
  Filled 2020-12-19 (×5): qty 1

## 2020-12-19 NOTE — TOC Initial Note (Signed)
Transition of Care [facility]) - Initial/Assessment Note    Patient Details  Name: Fernando [name] MRN: [id number] Date of Birth: 02/03/1956  [facility]) CM/SW Contact:    Candie Chroman, LCSW Phone Number: 12/19/2020, 12:49 PM  Clinical Narrative:  Readmission prevention screen complete. CSW met with patient. No supports at bedside. CSW introduced role and explained that discharge planning would be discussed. Patient lives home alone. PCP is [name], MD in [place]. Patient drives himself to appointments. [facility] [address]. No issues obtaining medications. No home health or DME use prior to admission. Patient declined SA resources. No further concerns. CSW encouraged patient to contact CSW as needed. CSW will continue to follow patient for support and facilitate return home when stable.                Expected Discharge Plan: Home/Self Care Barriers to Discharge: Continued Medical Work up   Patient Goals and CMS Choice        Expected Discharge Plan and Services Expected Discharge Plan: Home/Self Care     Post Acute Care Choice: NA Living arrangements for the past 2 months: Single Family Home                                      Prior Living Arrangements/Services Living arrangements for the past 2 months: Single Family Home Lives with:: Self Patient language and need for interpreter reviewed:: Yes Do you feel safe going back to the place where you live?: Yes      Need for Family Participation in Patient Care: Yes (Comment)     Criminal Activity/Legal Involvement Pertinent to Current Situation/Hospitalization: No - Comment as needed  Activities of Daily Living Home Assistive Devices/Equipment: None ADL Screening (condition at time of admission) Patient's cognitive ability adequate to safely complete daily activities?: Yes Is the patient deaf or have difficulty hearing?: No Does the patient have difficulty seeing, even when  wearing glasses/contacts?: No Does the patient have difficulty concentrating, remembering, or making decisions?: Yes Patient able to express need for assistance with ADLs?: Yes Does the patient have difficulty dressing or bathing?: No Independently performs ADLs?: Yes (appropriate for developmental age) Does the patient have difficulty walking or climbing stairs?: Yes Weakness of Legs: Both Weakness of Arms/Hands: Both  Permission Sought/Granted                  Emotional Assessment Appearance:: Appears stated age Attitude/Demeanor/Rapport: Engaged, Gracious Affect (typically observed): Accepting, Appropriate, Calm, Pleasant Orientation: : Oriented to Self, Oriented to Place, Oriented to  Time, Oriented to Situation Alcohol / Substance Use: Alcohol Use Psych Involvement: No (comment)  Admission diagnosis:  Lactic acidosis [E56.3] Alcoholic ketoacidosis [J49.7] SOB (shortness of breath) [R06.02] Sepsis due to pneumonia ([place]) [W26.3, Z85.8] Alcoholic cirrhosis, unspecified whether ascites present ([place]) [K70.30] Patient Active Problem List   Diagnosis Date Noted   Alcoholic ketoacidosis 85/05/7739   Chronic diastolic CHF (congestive heart failure) ([place]) 28/78/6767   Alcoholic cirrhosis of liver without ascites (HCC)    Acute GI bleeding 08/16/2020   Abnormal CT of the abdomen 05/16/2020   Pancreatic disease 05/16/2020   Cyst of pancreas 05/16/2020   Acute on chronic combined systolic and diastolic CHF (congestive heart failure) ([place]) 02/05/2020   Anemia of chronic disease    Alcoholic hepatitis without ascites    Portal hypertensive gastropathy (HCC)    Pancreatic mass  Sinus tachycardia    AKI (acute kidney injury) ([place]) 01/27/2020   Fall at home, initial encounter 01/26/2020   Hypotension 01/26/2020   Tobacco dependence 01/26/2020   Dislocation of prosthetic shoulder joint, initial encounter [employer])    Essential hypertension    Dyslipidemia    Alcohol dependence  ([place])    Pneumonitis    Generalized weakness 61/84/8592   Alcoholic hepatitis 76/39/4320   Sinus tachycardia 10/25/2019   Hyperammonemia ([place]) 10/25/2019   Alcohol withdrawal ([place]) 10/25/2019   Acute hypoxemic respiratory failure ([place]) 10/25/2019   Macrocytic anemia 10/25/2019   Thrombocytopenia ([place]) 10/25/2019   Hypertension    Gout    Tobacco abuse    Stasis dermatitis of both legs 09/04/2018   Medication monitoring encounter 08/09/2018   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[place]) 07/12/2018   Rotator cuff tear arthropathy, right 06/21/2018   PCP:  [name], MD Pharmacy[facility]   [id number] [place] [id number]: [phone]: [phone]    Social Determinants of Health (SDOH) Interventions    Readmission Risk Interventions Readmission Risk Prevention Plan 12/19/2020  Transportation Screening Complete  PCP or Specialist Appt within 3-5 Days Complete  Social Work Consult for [place]/Counseling Complete  Palliative Care Screening Not Applicable  Medication Review (Press photographer Complete  Some recent data might be hidden

## 2020-12-19 NOTE — Progress Notes (Signed)
Initial Nutrition Assessment  DOCUMENTATION CODES:   Non-severe (moderate) malnutrition in context of chronic illness  INTERVENTION:   Boost Breeze po TID, each supplement provides 250 kcal and 9 grams of protein  Recommend Ensure Enlive po TID with diet advancement, each supplement provides 350 kcal and 20 grams of protein  MVI, folic acid and thiamine po daily   Vitamin C 553m po BID   Recommend 2 gram sodium diet  Pt at high refeed risk; recommend monitor potassium, magnesium and phosphorus labs daily until stable  NUTRITION DIAGNOSIS:   Moderate Malnutrition related to chronic illness (cirrhosis, etoh abuse) as evidenced by mild fat depletion, moderate fat depletion, mild muscle depletion, moderate muscle depletion, 20 percent weight loss in 4 months.  GOAL:   Patient will meet greater than or equal to 90% of their needs  MONITOR:   PO intake, Supplement acceptance, Labs, Weight trends, Skin, I & O's  REASON FOR ASSESSMENT:   Malnutrition Screening Tool    ASSESSMENT:   65y.o. male with medical history significant for chronic diastolic CHF (EF 697-53%in 08/2020), ETOH dependence with history of DTs, cirrhosis, gout, umbilical hernia repair, prosthetic shoulder joint infection, HTN and HLD who presents to the ER by EMS for evaluation of weakness and GIB.  Pt s/p EGD in May 2022 which showed incidental stenosis in the upper esophagus that was dilated, clean-based ulcers were noted in the stomach and duodenum and mild portal hypertensive gastropathy and a nodular duodenal bulb.   Met with pt in room today. Pt reports decreased appetite and oral intake at baseline; this is likely r/t etoh abuse. Pt reports that he is hungry and would like to eat today but pt is NPO pending GI evaluation. Per chart, pt is down 36lbs(20%) over the past 4 months; pt was unaware that he had lost so much weight. RD discussed with pt the importance of adequate nutrition needed to preserve lean  muscle. Pt is willing to drink mixed berry Nepro once his diet advances. RD will add supplements and vitamins to help pt meet his estimated needs once his diet is advanced. Plan is for EGD tomorrow. Pt is at high refeed risk.   Medications reviewed and include: allopurinol, MVI, protonix, B1, NaCl @75ml /hr, octreotide   Labs reviewed: Na 130(L), K 3.3(L) Hgb 10.3(L), Hct 28.5(L)  NUTRITION - FOCUSED PHYSICAL EXAM:  Flowsheet Row Most Recent Value  Orbital Region No depletion  Upper Arm Region Mild depletion  Thoracic and Lumbar Region Mild depletion  Buccal Region No depletion  Temple Region Mild depletion  Clavicle Bone Region Severe depletion  Clavicle and Acromion Bone Region Severe depletion  Scapular Bone Region Moderate depletion  Dorsal Hand Mild depletion  Patellar Region Severe depletion  Anterior Thigh Region Severe depletion  Posterior Calf Region Severe depletion  Edema (RD Assessment) None  Hair Reviewed  Eyes Reviewed  Mouth Reviewed  Skin Reviewed  Nails Reviewed   Diet Order:   Diet Order             Diet NPO time specified  Diet effective now                  EDUCATION NEEDS:   Education needs have been addressed  Skin:  Skin Assessment: Reviewed RN Assessment (ecchymosis)  Last BM:  9/16- TYPE 7  Height:   Ht Readings from Last 1 Encounters:  12/18/20 5' 5"  (1.651 m)    Weight:   Wt Readings from Last 1 Encounters:  12/19/20 61.3 kg    Ideal Body Weight:  61.8 kg  BMI:  Body mass index is 22.48 kg/m.  Estimated Nutritional Needs:   Kcal:  1800-2100kcal/day  Protein:  90-105g/day  Fluid:  1.5-1.8L/day  Koleen Distance MS, RD, LDN Please refer to [facility] for RD and/or RD on-call/weekend/after hours pager

## 2020-12-19 NOTE — Progress Notes (Signed)
[facility] PROGRESS NOTE   Fernando [name] [id number] DOB: [birth year] DOA: 12/18/2020  PCP: [name], MD  Brief History/Interval Summary: 65 y.o. male with medical history significant for chronic diastolic CHF (EF [id number] in 08/2020);  ETOH dependence with history of DTs, history of prosthetic shoulder joint infection; HTN; and HLD who presents to the ER by EMS for evaluation of weakness.  Patient apparently sustained a fall at home as well.  In the emergency department he was noted to have symptoms and signs of alcohol withdrawal.  Electrolyte abnormalities noted on blood work.  Patient was hospitalized for further management.  Subsequently had a drop in hemoglobin.  Black stools were noted.  Gastroenterology was consulted.   Consultants: Gastroenterology  Procedures: None yet  Antibiotics: Anti-infectives (From admission, onward)    Start     Dose/Rate Route Frequency Ordered Stop   12/18/20 0915  cefTRIAXone (ROCEPHIN) 2 g in sodium chloride 0.9 % 100 mL IVPB        2 g 200 mL/hr over 30 Minutes Intravenous  Once 12/18/20 0906 12/18/20 1110   12/18/20 0915  azithromycin (ZITHROMAX) 500 mg in sodium chloride 0.9 % 250 mL IVPB        500 mg 250 mL/hr over 60 Minutes Intravenous  Once 12/18/20 0906 12/18/20 1210       Subjective/Interval History: Patient slightly distracted.  Feels shaky.  Denies any chest pain or shortness of breath.  Mild upper abdominal discomfort is present.  No nausea or vomiting.     Assessment/Plan:  Alcohol withdrawal syndrome/alcohol abuse Patient noted to be tremulous.  Vital signs however noted to be stable for the most part.  Remains on CIWA protocol.  Thiamine.  IV fluids.  Monitor on telemetry.  Upper GI bleed/acute blood loss anemia Noted to have black stools by nursing staff.  Occult blood testing is positive.  Drop in hemoglobin noted although some of it was probably dilutional.  Blood transfusion was ordered by overnight  staff. Previous records reviewed.  Looks like patient had an upper endoscopy in May 2022 by Dr. Almyra Free in [place].  Showed benign nonobstructing upper esophageal stenosis.  Clean-based nonbleeding gastric ulcers were noted.  Portal hypertensive gastropathy was seen.  Duodenitis and nonbleeding duodenal ulcers were noted. Discussed with gastroenterology who will consult.  PPI twice daily.  Octreotide infusion for now.  No ascites noted on CT abdomen pelvis. INR 1.0.  Alcoholic ketoacidosis Seems to have improved with IV hydration.  Hyponatremia/hypokalemia Replace potassium aggressively.  Low sodium multifactorial including hypovolemia as well as history of liver disease.  Magnesium 1.7.  Alcoholic liver cirrhosis No ascites noted on examination or on CT scan.  AST and ALT noted to be mildly elevated.  We will trend.  Thrombocytopenia Significant drop in platelet counts noted.  Likely due to bone marrow suppression from alcohol as well as history of liver cirrhosis.  Continue to trend.  Not on any heparin products currently.  History of chronic diastolic CHF Hold her diuretics.  Continue carvedilol.  Continue ARB.  Monitor volume status and blood pressures closely.  Pancreatic cyst Outpatient monitoring.  Physical deconditioning Will need PT and OT evaluation eventually.  DVT Prophylaxis: SCDs Code Status: Full code Family Communication: No family at bedside Disposition Plan: To be determined  Status is: Inpatient  Remains inpatient appropriate because:Ongoing diagnostic testing needed not appropriate for outpatient work up and IV treatments appropriate due to intensity of illness or inability to take PO  Dispo: The patient  is from: Home              Anticipated d/c is to: Home              Patient currently is not medically stable to d/c.   Difficult to place patient No     Medications: Scheduled:  sodium chloride   Intravenous Once   allopurinol  100 mg Oral Daily    carvedilol  3.125 mg Oral BID   LORazepam  0-4 mg Oral Q6H   Followed by   Derrill Memo ON 12/20/2020] LORazepam  0-4 mg Oral Q12H   losartan  12.5 mg Oral Daily   multivitamin with minerals  1 tablet Oral Daily   octreotide  50 mcg Intravenous Once   pantoprazole  40 mg Intravenous Q12H   Continuous:  octreotide  (SANDOSTATIN)    IV infusion     [id number] **OR** acetaminophen, albuterol, docusate sodium, LORazepam **OR** LORazepam, nicotine, ondansetron **OR** ondansetron (ZOFRAN) IV   Objective:  Vital Signs  Vitals:   12/19/20 0608 12/19/20 0817 12/19/20 0905 12/19/20 1157  BP: (!) 115/59 112/77 116/72 121/76  Pulse: 90 89 82 (!) 105  Resp: '16 18 18 19  '$ Temp: 98.1 F (36.7 C) 98.5 F (36.9 C) 98.1 F (36.7 C) 98.4 F (36.9 C)  TempSrc:   Oral Oral  SpO2: 99% 100% 98% 92%  Weight:      Height:        Intake/Output Summary (Last 24 hours) at 12/19/2020 1225 Last data filed at 12/19/2020 0905 Gross per 24 hour  Intake 878.75 ml  Output 2710 ml  Net -1831.25 ml   Filed Weights   12/18/20 0251 12/18/20 1854  Weight: 74.8 kg 65.5 kg    General appearance: Awake alert.  In no distress.  Mildly distracted. Resp: Clear to auscultation bilaterally.  Normal effort Cardio: S1-S2 is normal regular.  No S3-S4.  No rubs murmurs or bruit GI: Abdomen is soft.  Nontender nondistended.  Bowel sounds are present normal.  No masses organomegaly Extremities: No edema.  Full range of motion of lower extremities. Neurologic: Noted to be tremulous.  No obvious focal neurological deficits noted.   Lab Results:  Data Reviewed: I have personally reviewed following labs and imaging studies  CBC: Recent Labs  Lab 12/18/20 0307 12/19/20 0157  WBC 9.4 4.5  NEUTROABS 8.0*  --   HGB 11.1* 7.6*  HCT 32.4* 20.9*  MCV 108.7* 103.5*  PLT 103* 44*    Basic Metabolic Panel: Recent Labs  Lab 12/18/20 0307 12/18/20 0920 12/19/20 0157  NA 131*  --  128*  K 3.9  --  2.8*  CL 90*   --  87*  CO2 14*  --  28  GLUCOSE 113*  --  129*  BUN 21  --  20  CREATININE 0.93  --  0.74  CALCIUM 8.3*  --  8.5*  MG  --  1.7 1.7    GFR: Estimated Creatinine Clearance: 77.9 mL/min (by C-G formula based on SCr of 0.74 mg/dL).  Liver Function Tests: Recent Labs  Lab 12/18/20 0307  AST 105*  ALT 68*  ALKPHOS 163*  BILITOT 3.0*  PROT 7.4  ALBUMIN 4.0    Recent Labs  Lab 12/18/20 0920  LIPASE 133*   Recent Labs  Lab 12/19/20 0157  AMMONIA 35    Coagulation Profile: Recent Labs  Lab 12/18/20 0920 12/19/20 0403  INR 1.1 1.0    Cardiac Enzymes: Recent Labs  Lab 12/18/20  [place] Results (from the past 240 hour(s))  Blood culture (single)     Status: None (Preliminary result)   Collection Time: 12/18/20  9:20 AM   Specimen: BLOOD  Result Value Ref Range Status   Specimen Description BLOOD  Final   Special Requests   Final    BAA Blood Culture results may not be optimal due to an excessive volume of blood received in culture bottles   Culture   Final    NO GROWTH < 24 HOURS Performed at [facility], [address], [place], [place] [address]    Report Status PENDING  Incomplete  Resp Panel by RT-PCR (Flu A&B, Covid) Nasopharyngeal Swab     Status: None   Collection Time: 12/18/20  9:22 AM   Specimen: Nasopharyngeal Swab; Nasopharyngeal(NP) swabs in vial transport medium  Result Value Ref Range Status   SARS Coronavirus 2 by RT PCR NEGATIVE NEGATIVE Final    Comment: (NOTE) SARS-CoV-2 target nucleic acids are NOT DETECTED.  The SARS-CoV-2 RNA is generally detectable in upper respiratory specimens during the acute phase of infection. The lowest concentration of SARS-CoV-2 viral copies this assay can detect is 138 copies/mL. A negative result does not preclude SARS-Cov-2 infection and should not be used as the sole basis for treatment or other patient management decisions. A negative result may occur with   improper specimen collection/handling, submission of specimen other than nasopharyngeal swab, presence of viral mutation(s) within the areas targeted by this assay, and inadequate number of viral copies(<138 copies/mL). A negative result must be combined with clinical observations, patient history, and epidemiological information. The expected result is Negative.  Fact Sheet for Patients:  EntrepreneurPulse.com.au  Fact Sheet for Healthcare Providers:  IncredibleEmployment.be  This test is no t yet approved or cleared by the [place] FDA and  has been authorized for detection and/or diagnosis of SARS-CoV-2 by FDA under an Emergency Use Authorization (EUA). This EUA will remain  in effect (meaning this test can be used) for the duration of the COVID-19 declaration under Section 564(b)(1) of the Act, 21 U.S.C.section 360bbb-3(b)(1), unless the authorization is terminated  or revoked sooner.       Influenza A by PCR NEGATIVE NEGATIVE Final   Influenza B by PCR NEGATIVE NEGATIVE Final    Comment: (NOTE) The Xpert Xpress SARS-CoV-2/FLU/RSV plus assay is intended as an aid in the diagnosis of influenza from Nasopharyngeal swab specimens and should not be used as a sole basis for treatment. Nasal washings and aspirates are unacceptable for Xpert Xpress SARS-CoV-2/FLU/RSV testing.  Fact Sheet for Patients: EntrepreneurPulse.com.au  Fact Sheet for Healthcare Providers: IncredibleEmployment.be  This test is not yet approved or cleared by the [place] FDA and has been authorized for detection and/or diagnosis of SARS-CoV-2 by FDA under an Emergency Use Authorization (EUA). This EUA will remain in effect (meaning this test can be used) for the duration of the COVID-19 declaration under Section 564(b)(1) of the Act, 21 U.S.C. section 360bbb-3(b)(1), unless the authorization is terminated  or revoked.  Performed at [facility], [address] [place], [place] [address]   Culture, blood (single) w Reflex to ID Panel     Status: None (Preliminary result)   Collection Time: 12/19/20  1:57 AM   Specimen: BLOOD  Result Value Ref Range Status   Specimen Description BLOOD RIGHT HAND  Final   Special Requests   Final    BOTTLES DRAWN AEROBIC AND  ANAEROBIC Blood Culture results may not be optimal due to an excessive volume of blood received in culture bottles   Culture   Final    NO GROWTH < 12 HOURS Performed at [facility], [address] [place], [place] [address]    Report Status PENDING  Incomplete      Radiology Studies: CT HEAD WO CONTRAST (5MM)  Result Date: 12/18/2020 CLINICAL DATA:  Mental status change with unknown cause EXAM: CT HEAD WITHOUT CONTRAST TECHNIQUE: Contiguous axial images were obtained from the base of the skull through the vertex without intravenous contrast. COMPARISON:  09/01/2020 FINDINGS: Brain: No evidence of acute infarction, hemorrhage, hydrocephalus, extra-axial collection or mass lesion/mass effect. Cerebral volume loss which is generalized. Vascular: No hyperdense vessel or unexpected calcification. Skull: Normal. Negative for fracture or focal lesion. Sinuses/Orbits: Mild opacification in the right maxillary sinus. Bilateral cataract resection Other: No acute finding. IMPRESSION: 1. No acute finding. 2. Generalized brain atrophy. Electronically Signed   By: Jorje Guild M.D.   On: 12/18/2020 09:09   CT ABDOMEN PELVIS W CONTRAST  Result Date: 12/18/2020 CLINICAL DATA:  Nausea, vomiting, abdominal pain EXAM: CT ABDOMEN AND PELVIS WITH CONTRAST TECHNIQUE: Multidetector CT imaging of the abdomen and pelvis was performed using the standard protocol following bolus administration of intravenous contrast. CONTRAST:  179m OMNIPAQUE IOHEXOL 350 MG/ML SOLN COMPARISON:  05/01/2020 FINDINGS: Lower chest: Coronary artery and aortic  calcifications. No acute abnormality. Hepatobiliary: Diffuse low-density throughout the liver compatible with fatty infiltration. No focal abnormality. Gallbladder unremarkable. Pancreas: 3.3 cm cyst in the pancreatic tail compared to 4 cm previously. No ductal dilatation. Spleen: No focal abnormality.  Normal size. Adrenals/Urinary Tract: Bilateral renal cysts, the largest on the right measures 2.6 cm. No hydronephrosis. No stones. Adrenal glands and urinary bladder unremarkable. Stomach/Bowel: Normal appendix. Stomach, large and small bowel grossly unremarkable. Vascular/Lymphatic: Aortic atherosclerosis. No evidence of aneurysm or adenopathy. Reproductive: No visible focal abnormality. Other: No free fluid or free air. Musculoskeletal: Diffuse degenerative disc and facet disease. No acute bony abnormality. IMPRESSION: Hepatic steatosis. 3.3 cm cyst in the pancreatic tail compared to 4.1 cm previously. This may reflect a pseudo cyst, but continued follow-up recommended. Repeat CT in 6-12 months suggested. Aortic atherosclerosis, coronary artery disease. Electronically Signed   By: KRolm BaptiseM.D.   On: 12/18/2020 11:05   DG Chest Port 1 View  Result Date: 12/18/2020 CLINICAL DATA:  Shortness of breath EXAM: PORTABLE CHEST 1 VIEW COMPARISON:  Prior today and 08/29/2020 FINDINGS: The heart size and mediastinal contours are within normal limits. Low lung volumes are again seen. Chronic pulmonary interstitial prominence is again seen predominately in the lung bases, and without significant change. No evidence of superimposed pulmonary infiltrate or pleural effusion. Right shoulder prosthesis again noted IMPRESSION: Chronic bibasilar-predominant interstitial lung disease. No acute findings. Electronically Signed   By: JMarlaine HindM.D.   On: 12/18/2020 18:57   DG Chest Portable 1 View  Result Date: 12/18/2020 CLINICAL DATA:  Weakness, cough EXAM: PORTABLE CHEST 1 VIEW COMPARISON:  Chest radiograph 08/29/2020  FINDINGS: The cardiomediastinal silhouette is stable, allowing for slight rightward patient rotation. There are patchy opacities in the lung bases, left worse than right. The left basilar opacities are new/increased compared to the prior study. The upper lungs are well aerated. There is no pleural effusion. There is no pneumothorax. Right shoulder arthroplasty hardware is noted. IMPRESSION: Patchy opacities in the left base are increased from prior, concerning for infection in the correct clinical setting.  Recommend follow-up radiographs in 6-8 weeks to assess for resolution. Electronically Signed   By: Valetta Mole M.D.   On: 12/18/2020 08:58       LOS: 1 day   [facility] Pager on [email or web address]  12/19/2020, 12:25 PM

## 2020-12-19 NOTE — Consult Note (Signed)
Vonda Antigua, MD [address], [place], [place], [place], [address] [address], [place], [place], [place], [address] Phone: [phone]  Fax: [phone]  Consultation  Referring Provider:     Dr. Maryland Pink Primary Care Physician:  [name], MD Reason for Consultation:     Melena  Date of Admission:  12/18/2020 Date of Consultation:  12/19/2020         HPI:   Fernando [name] is a 65 y.o. male with history of CHF, alcohol dependence with history of DTs, presented with weakness, admitted for alcohol withdrawal with GI being consulted for melena.  Patient himself denies any melena, but nursing staff at bedside reports multiple melanotic bowel movements since last night, which are documented under flowsheets as well.  No episodes of emesis.  No abdominal pain.  Patient received PRBC transfusion overnight due to drop in hemoglobin and melanotic stool, with last documented bowel movement being at 9 this morning.  Patient hemodynamically stable.  Patient is not on any antithrombotics, anticoagulants  Patient has previous history of GI bleed, and pancreatic cyst.  Previous records reviewed by [facility].  Patient was admitted in May 2022 with melena, upper GI bleed, and underwent EGD on admission on Aug 17, 2020.  This showed an incidental stenosis in the upper esophagus that was dilated with passing of the scope.  Clean-based ulcers were noted in the stomach and duodenum.  Biopsies were done for H. pylori.  No varices were seen.  Pathology was negative for H. pylori EGD in October 2021 showed mild portal hypertensive gastropathy and a nodular duodenal bulb.  Patient has had a pancreatic cyst that has been followed by Dr. Rush Landmark and patient has undergone EUS for this.  Please see EUS report and clinic note by Dr. Rush Landmark.  Clinic note reviewed and repeat imaging was recommended in 4 months.  EUS cyst aspiration was not done due to findings suggesting a  possible blood vessel within the cyst.  CT abdomen pelvis yesterday does report his previously known pancreatic cyst and reports to reflect this to be a pseudocyst.  Past Medical History:  Diagnosis Date   Alcohol dependence ([place])    Arthritis    Cataract    Chronic systolic (congestive) heart failure (HCC)    EF 25-30% in 10/2019   Dyslipidemia    Essential hypertension    History of kidney stones    Hyperlipidemia    Hypertension    Pneumonia    Prosthetic shoulder infection, initial encounter [facility])     Past Surgical History:  Procedure Laterality Date   BIOPSY  04/21/2020   Procedure: BIOPSY;  Surgeon: Irving Copas., MD;  Location: [facility] ENDOSCOPY;  Service: Gastroenterology;;   BIOPSY  08/17/2020   Procedure: BIOPSY;  Surgeon: Carol Ada, MD;  Location[facility] ENDOSCOPY;  Service: Endoscopy;;   COLONOSCOPY     COLONOSCOPY     multiple   ESOPHAGOGASTRODUODENOSCOPY (EGD) WITH PROPOFOL N/A 01/31/2020   Procedure: ESOPHAGOGASTRODUODENOSCOPY (EGD) WITH PROPOFOL;  Surgeon: Doran Stabler, MD;  Location: [facility];  Service: Gastroenterology;  Laterality: N/A;   ESOPHAGOGASTRODUODENOSCOPY (EGD) WITH PROPOFOL N/A 04/21/2020   Procedure: ESOPHAGOGASTRODUODENOSCOPY (EGD) WITH PROPOFOL;  Surgeon: Rush Landmark Telford Nab., MD;  Location: [facility];  Service: Gastroenterology;  Laterality: N/A;   ESOPHAGOGASTRODUODENOSCOPY (EGD) WITH PROPOFOL N/A 08/17/2020   Procedure: ESOPHAGOGASTRODUODENOSCOPY (EGD) WITH PROPOFOL;  Surgeon: Carol Ada, MD;  Location: [place];  Service: Endoscopy;  Laterality: N/A;   EUS N/A 04/21/2020  Procedure: UPPER ENDOSCOPIC ULTRASOUND (EUS) LINEAR;  Surgeon: Rush Landmark Telford Nab., MD;  Location: [facility];  Service: Gastroenterology;  Laterality: N/A;   [place]   left men. repair   KNEE SURGERY  1975   REVERSE SHOULDER ARTHROPLASTY Right 67/18/2020   Procedure: REVERSE SHOULDER ARTHROPLASTY;  Surgeon:  Hiram Gash, MD;  Location: [facility];  Service: Orthopedics;  Laterality: Right;   ROTATOR CUFF REPAIR     right   ROTATOR CUFF REPAIR     TONSILLECTOMY     TOTAL SHOULDER REPLACEMENT  65   UMBILICAL HERNIA REPAIR      Prior to Admission medications   Medication Sig Start Date End Date Taking? Authorizing Provider  allopurinol (ZYLOPRIM) 100 MG tablet Take 100 mg by mouth daily. 08/09/18  Yes [provider]  carvedilol (COREG) 3.125 MG tablet Take 1 tablet (3.125 mg total) by mouth 2 (two) times daily. Do not take is Systolic BP is [id number]  Yes Pokhrel, Laxman, MD  docusate sodium (COLACE) 100 MG capsule Take 1 capsule (100 mg total) by mouth 2 (two) times daily as needed for mild constipation. 08/25/20  Yes Pokhrel, Laxman, MD  folic acid (FOLVITE) 1 MG tablet Take 1 mg by mouth daily. 12/03/19  Yes [provider]  furosemide (LASIX) 40 MG tablet Take 1 tablet (40 mg total) by mouth daily. 02/05/20 02/04/21 Yes Debbe Odea, MD  losartan (COZAAR) 25 MG tablet Take 0.5 tablets (12.5 mg total) by mouth daily. DO not take if systolic blood pressure [id number] 08/25/20  Yes Pokhrel, Laxman, MD  albuterol (PROVENTIL) (2.5 MG/3ML) 0.083% nebulizer solution Take 3 mLs (2.5 mg total) by nebulization every 6 (six) hours as needed for wheezing. Patient not taking: Reported on 12/18/2020 02/05/20   Debbe Odea, MD  arformoterol (BROVANA) 15 MCG/2ML NEBU Take 2 mLs (15 mcg total) by nebulization 2 (two) times daily. Patient not taking: Reported on 12/18/2020 08/29/20   Patrecia Pour, MD  budesonide (PULMICORT) 0.25 MG/2ML nebulizer solution Take 2 mLs (0.25 mg total) by nebulization 2 (two) times daily. Patient not taking: Reported on 12/18/2020 08/29/20   Patrecia Pour, MD  famotidine (PEPCID) 40 MG tablet Take 1 tablet by mouth at bedtime. Patient not taking: Reported on 12/18/2020    [provider]  guaiFENesin-dextromethorphan (ROBITUSSIN DM) 100-10 MG/5ML syrup Take 10 mLs by  mouth every 6 (six) hours as needed for cough. Patient not taking: Reported on 12/18/2020 08/25/20   Pokhrel, Corrie Mckusick, MD  LORazepam (ATIVAN) 1 MG tablet Take 1 tablet (1 mg total) by mouth every 6 (six) hours as needed for anxiety. Patient not taking: Reported on 12/18/2020 08/29/20   Patrecia Pour, MD  Multiple Vitamin (MULTIVITAMIN) tablet Take 1 tablet by mouth daily.  Patient not taking: Reported on 12/18/2020    [provider]  nicotine (NICODERM CQ - DOSED IN MG/24 HOURS) 14 mg/24hr patch Place 1 patch (14 mg total) onto the skin daily. Patient not taking: Reported on 12/18/2020 08/26/20   Flora Lipps, MD  pantoprazole (PROTONIX) 40 MG tablet Take 1 tablet (40 mg total) by mouth 2 (two) times daily for 14 days, THEN 1 tablet (40 mg total) daily. 08/25/20 11/17/20  Pokhrel, Corrie Mckusick, MD  revefenacin (YUPELRI) 175 MCG/3ML nebulizer solution Take 3 mLs (175 mcg total) by nebulization daily. Patient not taking: Reported on 12/18/2020 08/30/20   Patrecia Pour, MD  thiamine 100 MG tablet Take 1 tablet (100  mg total) by mouth daily. Patient not taking: Reported on 12/18/2020 08/26/20   Flora Lipps, MD    Family History  Problem Relation Age of Onset   Heart failure Mother    Lung cancer Father    Colon cancer Neg Hx    Esophageal cancer Neg Hx    Stomach cancer Neg Hx    Liver cancer Neg Hx    Pancreatic cancer Neg Hx    Rectal cancer Neg Hx    Inflammatory bowel disease Neg Hx      Social History   Tobacco Use   Smoking status: Every Day    Packs/day: 0.50    Years: 50.00    Pack years: 25.00    Types: Cigarettes   Smokeless tobacco: Never  Vaping Use   Vaping Use: Never used  Substance Use Topics   Alcohol use: Yes    Alcohol/week: 14.0 standard drinks    Types: 14 Shots of liquor per week   Drug use: Not Currently    Types: Marijuana    Allergies as of 12/18/2020 - Review Complete 12/18/2020  Allergen Reaction Noted   Atorvastatin  04/15/2020   Doxycycline   04/15/2020    Review of Systems:    All systems reviewed and negative except where noted in HPI.   Physical Exam:  Constitutional: General:   Alert,  Well-developed, well-nourished, pleasant and cooperative in NAD BP 121/76 (BP Location: Right Arm)   Pulse (!) 105   Temp 98.4 F (36.9 C) (Oral)   Resp 19   Ht '5\' 5"'$  (1.651 m)   Wt 65.5 kg   SpO2 92%   BMI 24.03 kg/m   Eyes:  Sclera clear, no icterus.   Conjunctiva pink. PERRLA  Ears:  No scars, lesions or masses, Normal auditory acuity. Nose:  No deformity, discharge, or lesions. Mouth:  No deformity or lesions, oropharynx pink & moist.  Neck:  Supple; no masses or thyromegaly.  Respiratory: Normal respiratory effort, Normal percussion  Gastrointestinal:  Normal bowel sounds.  No bruits.  Soft, non-tender and non-distended without masses, hepatosplenomegaly or hernias noted.  No guarding or rebound tenderness.     Cardiac: No clubbing or edema.  No cyanosis. Normal posterior tibial pedal pulses noted.  Lymphatic:  No significant cervical or axillary adenopathy.  Psych:  Alert and cooperative. Normal mood and affect.  Musculoskeletal:  Normal gait. Head normocephalic, atraumatic. Symmetrical without gross deformities. 5/5 Upper and Lower extremity strength bilaterally.  Skin: Warm. Intact without significant lesions or rashes. No jaundice.  Neurologic:  Face symmetrical, tongue midline, Normal sensation to touch;  grossly normal neurologically.  Psych:  Alert and oriented x3, Alert and cooperative. Normal mood and affect.   LAB RESULTS: Recent Labs    12/18/20 0307 12/19/20 0157 12/19/20 1242  WBC 9.4 4.5  --   HGB 11.1* 7.6* 10.3*  HCT 32.4* 20.9* 28.5*  PLT 103* 44*  --    BMET Recent Labs    12/18/20 0307 12/19/20 0157 12/19/20 1242  NA 131* 128* 130*  K 3.9 2.8* 3.3*  CL 90* 87* 88*  CO2 14* 28 26  GLUCOSE 113* 129* 87  BUN '21 20 16  '$ CREATININE 0.93 0.74 0.70  CALCIUM 8.3* 8.5* 8.7*    LFT Recent Labs    12/18/20 0307  PROT 7.4  ALBUMIN 4.0  AST 105*  ALT 68*  ALKPHOS 163*  BILITOT 3.0*   PT/INR Recent Labs    12/18/20 0920 12/19/20 0403  LABPROT 14.2  13.6  INR 1.1 1.0    STUDIES: CT HEAD WO CONTRAST (5MM)  Result Date: 12/18/2020 CLINICAL DATA:  Mental status change with unknown cause EXAM: CT HEAD WITHOUT CONTRAST TECHNIQUE: Contiguous axial images were obtained from the base of the skull through the vertex without intravenous contrast. COMPARISON:  09/01/2020 FINDINGS: Brain: No evidence of acute infarction, hemorrhage, hydrocephalus, extra-axial collection or mass lesion/mass effect. Cerebral volume loss which is generalized. Vascular: No hyperdense vessel or unexpected calcification. Skull: Normal. Negative for fracture or focal lesion. Sinuses/Orbits: Mild opacification in the right maxillary sinus. Bilateral cataract resection Other: No acute finding. IMPRESSION: 1. No acute finding. 2. Generalized brain atrophy. Electronically Signed   By: Jorje Guild M.D.   On: 12/18/2020 09:09   CT ABDOMEN PELVIS W CONTRAST  Result Date: 12/18/2020 CLINICAL DATA:  Nausea, vomiting, abdominal pain EXAM: CT ABDOMEN AND PELVIS WITH CONTRAST TECHNIQUE: Multidetector CT imaging of the abdomen and pelvis was performed using the standard protocol following bolus administration of intravenous contrast. CONTRAST:  189m OMNIPAQUE IOHEXOL 350 MG/ML SOLN COMPARISON:  05/01/2020 FINDINGS: Lower chest: Coronary artery and aortic calcifications. No acute abnormality. Hepatobiliary: Diffuse low-density throughout the liver compatible with fatty infiltration. No focal abnormality. Gallbladder unremarkable. Pancreas: 3.3 cm cyst in the pancreatic tail compared to 4 cm previously. No ductal dilatation. Spleen: No focal abnormality.  Normal size. Adrenals/Urinary Tract: Bilateral renal cysts, the largest on the right measures 2.6 cm. No hydronephrosis. No stones. Adrenal glands and  urinary bladder unremarkable. Stomach/Bowel: Normal appendix. Stomach, large and small bowel grossly unremarkable. Vascular/Lymphatic: Aortic atherosclerosis. No evidence of aneurysm or adenopathy. Reproductive: No visible focal abnormality. Other: No free fluid or free air. Musculoskeletal: Diffuse degenerative disc and facet disease. No acute bony abnormality. IMPRESSION: Hepatic steatosis. 3.3 cm cyst in the pancreatic tail compared to 4.1 cm previously. This may reflect a pseudo cyst, but continued follow-up recommended. Repeat CT in 6-12 months suggested. Aortic atherosclerosis, coronary artery disease. Electronically Signed   By: KRolm BaptiseM.D.   On: 12/18/2020 11:05   DG Chest Port 1 View  Result Date: 12/18/2020 CLINICAL DATA:  Shortness of breath EXAM: PORTABLE CHEST 1 VIEW COMPARISON:  Prior today and 08/29/2020 FINDINGS: The heart size and mediastinal contours are within normal limits. Low lung volumes are again seen. Chronic pulmonary interstitial prominence is again seen predominately in the lung bases, and without significant change. No evidence of superimposed pulmonary infiltrate or pleural effusion. Right shoulder prosthesis again noted IMPRESSION: Chronic bibasilar-predominant interstitial lung disease. No acute findings. Electronically Signed   By: JMarlaine HindM.D.   On: 12/18/2020 18:57   DG Chest Portable 1 View  Result Date: 12/18/2020 CLINICAL DATA:  Weakness, cough EXAM: PORTABLE CHEST 1 VIEW COMPARISON:  Chest radiograph 08/29/2020 FINDINGS: The cardiomediastinal silhouette is stable, allowing for slight rightward patient rotation. There are patchy opacities in the lung bases, left worse than right. The left basilar opacities are new/increased compared to the prior study. The upper lungs are well aerated. There is no pleural effusion. There is no pneumothorax. Right shoulder arthroplasty hardware is noted. IMPRESSION: Patchy opacities in the left base are increased from prior,  concerning for infection in the correct clinical setting. Recommend follow-up radiographs in 6-8 weeks to assess for resolution. Electronically Signed   By: PValetta MoleM.D.   On: 12/18/2020 08:58      Impression / Plan:   SCARMIN MALENis a 65y.o. y/o male with admission for alcohol withdrawal, with previous history  of GI bleeds from gastric/duodenal ulcers, previously noted mild portal hypertensive gastropathy, history of pancreatic cyst, with melena last night associated with anemia  Melena/anemia EGD indicated for further evaluation of melena Patient has not had a bowel movement since 9 AM this morning He is hemodynamically stable N.p.o. past midnight and will proceed with EGD tomorrow for further evaluation of his symptoms  Symptoms may be due to ulcers, versus portal hypertensive gastropathy  Unlikely to be from variceal bleeding given clinical symptoms and also varices were not seen on most recent EGD this year  Would recommend octreotide drip  PPI IV twice daily  Continue serial CBCs and transfuse PRN Avoid NSAIDs Maintain 2 large-bore IV lines Please page GI with any acute hemodynamic changes, or signs of active GI bleeding  hypokalemia has improved from yesterday.  Continue to replace as needed, monitor and optimize for endoscopy.  Level above 3 is usually what our anesthesiologists target prior to endoscopy  Hemoglobin has improved significantly post PRBC transfusion.  No indication for emergent EGD at this time  I have discussed alternative options, risks & benefits,  which include, but are not limited to, bleeding, infection, perforation,respiratory complication & drug reaction.  The patient agrees with this plan & written consent will be obtained.    Pancreatic cyst follow-up with Dr. Rush Landmark at discharge for pancreatic cyst  Alcohol abuse CIWA protocol Folate, thiamine Encourage abstinence  Thank you for involving me in the care of this patient.       LOS: 1 day   Virgel Manifold, MD  12/19/2020, 2:36 PM

## 2020-12-19 NOTE — Progress Notes (Signed)
Cross Cover Patient with significant drop in hemoglobin 11.1 to 7.6, and platelets from 103 to 44.  Numbers confirmed with second blood draw.   3 dark small stools overnight but no bright red blood. Blood pressures on lower side of normal.  Unit of packed red blood cells ordered with consent of patient for transfusion Coags ordered Protonix infusion ordered Ay neeed to consider inpatient GI

## 2020-12-19 NOTE — Progress Notes (Signed)
MEDICATION RELATED CONSULT NOTE - FOLLOW UP   Pharmacy Consult for ED ABX not continued once admitted as Inpt.  Indication: Possible PNA  Allergies  Allergen Reactions   Atorvastatin     Other reaction(s): joints ache   Doxycycline     Other reaction(s): rash/hive    Patient Measurements: Height: '5\' 5"'$  (165.1 cm) Weight: 65.5 kg (144 lb 6.4 oz) IBW/kg (Calculated) : 61.5  Vital Signs: Temp: 98.5 F (36.9 C) (09/16 0817) Temp Source: Oral (09/16 0549) BP: 112/77 (09/16 0817) Pulse Rate: 89 (09/16 0817) Intake/Output from previous day: 09/15 0701 - 09/16 0700 In: 480 [P.O.:480] Out: 3110 [Urine:3110] Intake/Output from this shift: No intake/output data recorded.  Labs: Recent Labs    12/18/20 0307 12/18/20 0920 12/19/20 0157 12/19/20 0403  WBC 9.4  --  4.5  --   HGB 11.1*  --  7.6*  --   HCT 32.4*  --  20.9*  --   PLT 103*  --  44*  --   APTT  --   --   --  28  CREATININE 0.93  --  0.74  --   MG  --  1.7 1.7  --   ALBUMIN 4.0  --   --   --   PROT 7.4  --   --   --   AST 105*  --   --   --   ALT 68*  --   --   --   ALKPHOS 163*  --   --   --   BILITOT 3.0*  --   --   --    Estimated Creatinine Clearance: 77.9 mL/min (by C-G formula based on SCr of 0.74 mg/dL).   Microbiology: Recent Results (from the past 720 hour(s))  Blood culture (single)     Status: None (Preliminary result)   Collection Time: 12/18/20  9:20 AM   Specimen: BLOOD  Result Value Ref Range Status   Specimen Description BLOOD  Final   Special Requests   Final    BAA Blood Culture results may not be optimal due to an excessive volume of blood received in culture bottles   Culture   Final    NO GROWTH < 24 HOURS Performed at [facility], [address] [place],  [address]    Report Status PENDING  Incomplete  Resp Panel by RT-PCR (Flu A&B, Covid) Nasopharyngeal Swab     Status: None   Collection Time: 12/18/20  9:22 AM   Specimen: Nasopharyngeal Swab;  Nasopharyngeal(NP) swabs in vial transport medium  Result Value Ref Range Status   SARS Coronavirus 2 by RT PCR NEGATIVE NEGATIVE Final    Comment: (NOTE) SARS-CoV-2 target nucleic acids are NOT DETECTED.  The SARS-CoV-2 RNA is generally detectable in upper respiratory specimens during the acute phase of infection. The lowest concentration of SARS-CoV-2 viral copies this assay can detect is 138 copies/mL. A negative result does not preclude SARS-Cov-2 infection and should not be used as the sole basis for treatment or other patient management decisions. A negative result may occur with  improper specimen collection/handling, submission of specimen other than nasopharyngeal swab, presence of viral mutation(s) within the areas targeted by this assay, and inadequate number of viral copies(<138 copies/mL). A negative result must be combined with clinical observations, patient history, and epidemiological information. The expected result is Negative.  Fact Sheet for Patients:  EntrepreneurPulse.com.au  Fact Sheet for Healthcare Providers:  IncredibleEmployment.be  This test is no t yet approved  or cleared by the [place] and  has been authorized for detection and/or diagnosis of SARS-CoV-2 by FDA under an Emergency Use Authorization (EUA). This EUA will remain  in effect (meaning this test can be used) for the duration of the COVID-19 declaration under Section 564(b)(1) of the Act, 21 U.S.C.section 360bbb-3(b)(1), unless the authorization is terminated  or revoked sooner.       Influenza A by PCR NEGATIVE NEGATIVE Final   Influenza B by PCR NEGATIVE NEGATIVE Final    Comment: (NOTE) The Xpert Xpress SARS-CoV-2/FLU/RSV plus assay is intended as an aid in the diagnosis of influenza from Nasopharyngeal swab specimens and should not be used as a sole basis for treatment. Nasal washings and aspirates are unacceptable for Xpert Xpress  SARS-CoV-2/FLU/RSV testing.  Fact Sheet for Patients: EntrepreneurPulse.com.au  Fact Sheet for Healthcare Providers: IncredibleEmployment.be  This test is not yet approved or cleared by the [place] FDA and has been authorized for detection and/or diagnosis of SARS-CoV-2 by FDA under an Emergency Use Authorization (EUA). This EUA will remain in effect (meaning this test can be used) for the duration of the COVID-19 declaration under Section 564(b)(1) of the Act, 21 U.S.C. section 360bbb-3(b)(1), unless the authorization is terminated or revoked.  Performed at [facility], [address] [place], [place] [address]   Culture, blood (single) w Reflex to ID Panel     Status: None (Preliminary result)   Collection Time: 12/19/20  1:57 AM   Specimen: BLOOD  Result Value Ref Range Status   Specimen Description BLOOD RIGHT HAND  Final   Special Requests   Final    BOTTLES DRAWN AEROBIC AND ANAEROBIC Blood Culture results may not be optimal due to an excessive volume of blood received in culture bottles   Culture   Final    NO GROWTH < 12 HOURS Performed at [facility], [address], [place], [place] [address]    Report Status PENDING  Incomplete    Medications:  Azithromycin '500mg'$  IV Ceftriaxone 2gms IV  Assessment: 65 year old male with a history of alcohol abuse who presents to the ER via EMS for evaluation of generalized weakness and a fall at home. Possible infiltrates on inital CXR.  Plan:  No abx indicated for inpt admission. Repeat CXR: Chronic bibasilar-predominant interstitial lung disease. No acute findings.  Berta Minor 12/19/2020,9:08 AM

## 2020-12-19 NOTE — Evaluation (Signed)
Physical Therapy Evaluation Patient Details Name: Fernando [name] MRN: [id number] DOB: Apr 14, 1955 Today's Date: 12/19/2020  History of Present Illness  Pt is a 65 y.o. male presenting to hospital 9/15 with progressive generalized weakness (s/p fall).  Pt admitted with alcoholic ketoacidosis, metabolic acidosis, and generalized weakness.  PMH includes alcohol dependence, chronic systolic CHF, htn, PNA, acute GI bleeding, pancreatic disease, sinus tachycardia, acute hypoxemic respiratory failure, R reverse shoulder arthroplasty 2020, knee surgery L.  Clinical Impression  Prior to hospital admission, pt was independent with ambulation; lives on main level of home with 1/2 step to enter with R railing.  Currently pt is min assist with bed mobility and SBA with sitting balance.  Pt reporting dizziness and lightheadedness in sitting and then after a few minutes sitting pt appearing more pale in his face so pt assisted back to bed for safety (pt's color and symptoms improved resting in bed)--nurse present and notified.  Pt would benefit from skilled PT to address noted impairments and functional limitations (see below for any additional details).  Upon hospital discharge, pt may benefit from SNF pending pt progress during hospitalization.       Recommendations for follow up therapy are one component of a multi-disciplinary discharge planning process, led by the attending physician.  Recommendations may be updated based on patient status, additional functional criteria and insurance authorization.  Follow Up Recommendations SNF (pending pt progress)    Equipment Recommendations  Rolling walker with 5" wheels    Recommendations for Other Services OT consult     Precautions / Restrictions Precautions Precautions: Fall Precaution Comments: NPO Restrictions Weight Bearing Restrictions: No      Mobility  Bed Mobility Overal bed mobility: Needs Assistance Bed Mobility: Supine to Sit;Sit to Supine      Supine to sit: Min assist;HOB elevated Sit to supine: Min assist;HOB elevated   General bed mobility comments: assist for trunk semi-supine to sitting; assist for LE's sit to semi-supine in bed    Transfers                 General transfer comment: Deferred d/t pt appearing pale sitting edge of bed  Ambulation/Gait                Stairs            Wheelchair Mobility    Modified Rankin (Stroke Patients Only)       Balance Overall balance assessment: Needs assistance Sitting-balance support: No upper extremity supported;Feet supported Sitting balance-Leahy Scale: Good Sitting balance - Comments: steady sitting reaching within BOS                                     Pertinent Vitals/Pain Pain Assessment: No/denies pain HR 95-114 bpm and O2 sats 92% or greater on room air during sessions activities.    Home Living Family/patient expects to be discharged to:: Private residence Living Arrangements: Alone   Type of Home: House Home Access: Stairs to enter Entrance Stairs-Rails: Right Entrance Stairs-Number of Steps: 1/2 step to enter Home Layout: Two level;Able to live on main level with bedroom/bathroom Home Equipment: Gilford Rile - 2 wheels;Cane - single point;Shower seat - built in      Prior Function Level of Independence: Independent               Hand Dominance        Extremity/Trunk Assessment   Upper Extremity  Assessment Upper Extremity Assessment: Generalized weakness    Lower Extremity Assessment Lower Extremity Assessment: Generalized weakness    Cervical / Trunk Assessment Cervical / Trunk Assessment: Normal  Communication   Communication: HOH  Cognition Arousal/Alertness: Awake/alert Behavior During Therapy: WFL for tasks assessed/performed Overall Cognitive Status: Within Functional Limits for tasks assessed                                        General Comments  Nursing cleared  pt for participation in physical therapy.  Pt agreeable to PT session.    Exercises     Assessment/Plan    PT Assessment Patient needs continued PT services  PT Problem List Decreased strength;Decreased activity tolerance;Decreased balance;Decreased mobility;Decreased knowledge of use of DME       PT Treatment Interventions DME instruction;Gait training;Stair training;Functional mobility training;Therapeutic activities;Therapeutic exercise;Balance training;Patient/family education    PT Goals (Current goals can be found in the Care Plan section)  Acute Rehab PT Goals Patient Stated Goal: to improve strength and mobility PT Goal Formulation: With patient Time For Goal Achievement: 01/02/21 Potential to Achieve Goals: Good    Frequency Min 2X/week   Barriers to discharge Decreased caregiver support      Co-evaluation               AM-PAC PT "6 Clicks" Mobility  Outcome Measure Help needed turning from your back to your side while in a flat bed without using bedrails?: None Help needed moving from lying on your back to sitting on the side of a flat bed without using bedrails?: A Little Help needed moving to and from a bed to a chair (including a wheelchair)?: A Little Help needed standing up from a chair using your arms (e.g., wheelchair or bedside chair)?: A Lot Help needed to walk in hospital room?: A Lot Help needed climbing 3-5 steps with a railing? : Total 6 Click Score: 15    End of Session   Activity Tolerance: Other (comment) (Limited d/t pt reporting dizziness/lightheadedness and appearing pale in sitting) Patient left: in bed;with call bell/phone within reach;with bed alarm set Nurse Communication: Mobility status;Precautions;Other (comment) (pt's symptoms and appearing pale in sitting) PT Visit Diagnosis: Other abnormalities of gait and mobility (R26.89);Muscle weakness (generalized) (M62.81);History of falling (Z91.81)    Time: [id number] PT Time  Calculation (min) (ACUTE ONLY): 21 min   Charges:   PT Evaluation $PT Eval Low Complexity: 1 Low         Fernando [name], PT 12/19/20, 5:17 PM

## 2020-12-20 ENCOUNTER — Inpatient Hospital Stay: Payer: Medicare Other | Admitting: Anesthesiology

## 2020-12-20 ENCOUNTER — Encounter: Payer: Self-pay | Admitting: Internal Medicine

## 2020-12-20 ENCOUNTER — Encounter
Admission: EM | Disposition: A | Discharge status: Home Health | Payer: Self-pay | Source: Home / Self Care | Attending: Internal Medicine

## 2020-12-20 ENCOUNTER — Other Ambulatory Visit: Payer: Self-pay

## 2020-12-20 DIAGNOSIS — K253 Acute gastric ulcer without hemorrhage or perforation: Secondary | ICD-10-CM | POA: Diagnosis not present

## 2020-12-20 DIAGNOSIS — I10 Essential (primary) hypertension: Secondary | ICD-10-CM | POA: Diagnosis not present

## 2020-12-20 DIAGNOSIS — D62 Acute posthemorrhagic anemia: Secondary | ICD-10-CM | POA: Diagnosis not present

## 2020-12-20 DIAGNOSIS — K921 Melena: Secondary | ICD-10-CM | POA: Diagnosis not present

## 2020-12-20 DIAGNOSIS — K7030 Alcoholic cirrhosis of liver without ascites: Secondary | ICD-10-CM | POA: Diagnosis not present

## 2020-12-20 DIAGNOSIS — K209 Esophagitis, unspecified without bleeding: Secondary | ICD-10-CM | POA: Diagnosis not present

## 2020-12-20 DIAGNOSIS — F10239 Alcohol dependence with withdrawal, unspecified: Principal | ICD-10-CM

## 2020-12-20 DIAGNOSIS — K922 Gastrointestinal hemorrhage, unspecified: Secondary | ICD-10-CM | POA: Diagnosis not present

## 2020-12-20 HISTORY — PX: ESOPHAGOGASTRODUODENOSCOPY: SHX5428

## 2020-12-20 LAB — COMPREHENSIVE METABOLIC PANEL
ALT: 38 U/L (ref 0–44)
AST: 68 U/L — ABNORMAL HIGH (ref 15–41)
Albumin: 3.5 g/dL (ref 3.5–5.0)
Alkaline Phosphatase: 116 U/L (ref 38–126)
Anion gap: 10 (ref 5–15)
BUN: 11 mg/dL (ref 8–23)
CO2: 31 mmol/L (ref 22–32)
Calcium: 8.4 mg/dL — ABNORMAL LOW (ref 8.9–10.3)
Chloride: 89 mmol/L — ABNORMAL LOW (ref 98–111)
Creatinine, Ser: 0.56 mg/dL — ABNORMAL LOW (ref 0.61–1.24)
GFR, Estimated: >60 mL/min (ref >60)
Glucose, Bld: 145 mg/dL — ABNORMAL HIGH (ref 70–99)
Potassium: 3.7 mmol/L (ref 3.5–5.1)
Sodium: 130 mmol/L — ABNORMAL LOW (ref 135–145)
Total Bilirubin: 2.3 mg/dL — ABNORMAL HIGH (ref 0.3–1.2)
Total Protein: 6.2 g/dL — ABNORMAL LOW (ref 6.5–8.1)

## 2020-12-20 LAB — CBC
HCT: 28.3 % — ABNORMAL LOW (ref 39.0–52.0)
Hemoglobin: 10.5 g/dL — ABNORMAL LOW (ref 13.0–17.0)
MCH: 36.1 pg — ABNORMAL HIGH (ref 26.0–34.0)
MCHC: 37.1 g/dL — ABNORMAL HIGH (ref 30.0–36.0)
MCV: 97.3 fL (ref 80.0–100.0)
Platelets: 48 10*3/uL — ABNORMAL LOW (ref 150–400)
RBC: 2.91 MIL/uL — ABNORMAL LOW (ref 4.22–5.81)
RDW: 16.7 % — ABNORMAL HIGH (ref 11.5–15.5)
WBC: 5.6 10*3/uL (ref 4.0–10.5)
nRBC: 0 % (ref 0.0–0.2)

## 2020-12-20 SURGERY — EGD (ESOPHAGOGASTRODUODENOSCOPY)
Anesthesia: Monitor Anesthesia Care

## 2020-12-20 MED ORDER — METOCLOPRAMIDE HCL 5 MG/ML IJ SOLN
INTRAMUSCULAR | Status: DC | PRN
  Administered 2020-12-20: 10 mg via INTRAVENOUS

## 2020-12-20 MED ORDER — LORAZEPAM 2 MG/ML IJ SOLN: 0.0000 mg | Freq: Two times a day (BID) | INTRAMUSCULAR | Status: DC

## 2020-12-20 MED ORDER — PROPOFOL 500 MG/50ML IV EMUL
INTRAVENOUS | Status: DC | PRN
  Administered 2020-12-20: 190 mg via INTRAVENOUS

## 2020-12-20 MED ORDER — FAMOTIDINE IN NACL 20-0.9 MG/50ML-% IV SOLN
20.0000 mg | Freq: Once | INTRAVENOUS | Status: DC
  Filled 2020-12-20: qty 50

## 2020-12-20 MED ORDER — FAMOTIDINE 20 MG IN NS 100 ML IVPB
20.0000 mg | Freq: Once | INTRAVENOUS | Status: AC
  Administered 2020-12-20: 20 mg via INTRAVENOUS
  Filled 2020-12-20: qty 100

## 2020-12-20 MED ORDER — LORAZEPAM 2 MG/ML IJ SOLN
0.0000 mg | Freq: Four times a day (QID) | INTRAMUSCULAR | Status: DC
  Administered 2020-12-20 – 2020-12-21 (×5): 2 mg via INTRAVENOUS
  Filled 2020-12-20 (×5): qty 1

## 2020-12-20 MED ORDER — MAGNESIUM SULFATE 2 GM/50ML IV SOLN
2.0000 g | Freq: Once | INTRAVENOUS | Status: AC
  Administered 2020-12-20: 2 g via INTRAVENOUS
  Filled 2020-12-20: qty 50

## 2020-12-20 MED ORDER — IPRATROPIUM-ALBUTEROL 0.5-2.5 (3) MG/3ML IN SOLN
RESPIRATORY_TRACT | Status: AC
  Administered 2020-12-20: 3 mL via RESPIRATORY_TRACT
  Filled 2020-12-20: qty 3

## 2020-12-20 MED ORDER — METOCLOPRAMIDE HCL 5 MG/ML IJ SOLN
INTRAMUSCULAR | Status: AC
  Filled 2020-12-20: qty 2

## 2020-12-20 MED ORDER — LORAZEPAM 1 MG PO TABS: 1.0000 mg | ORAL_TABLET | ORAL | Status: AC | PRN

## 2020-12-20 MED ORDER — LIDOCAINE HCL (PF) 2 % IJ SOLN
INTRAMUSCULAR | Status: AC
  Filled 2020-12-20: qty 5

## 2020-12-20 MED ORDER — PROPOFOL 500 MG/50ML IV EMUL
INTRAVENOUS | Status: AC
  Filled 2020-12-20: qty 50

## 2020-12-20 MED ORDER — EPHEDRINE 5 MG/ML INJ
INTRAVENOUS | Status: AC
  Filled 2020-12-20: qty 5

## 2020-12-20 MED ORDER — EPHEDRINE SULFATE 50 MG/ML IJ SOLN
INTRAMUSCULAR | Status: DC | PRN
  Administered 2020-12-20: 10 mg via INTRAVENOUS

## 2020-12-20 MED ORDER — LIDOCAINE HCL (CARDIAC) PF 100 MG/5ML IV SOSY
PREFILLED_SYRINGE | INTRAVENOUS | Status: DC | PRN
  Administered 2020-12-20: 100 mg via INTRAVENOUS

## 2020-12-20 MED ORDER — LORAZEPAM 2 MG/ML IJ SOLN: 1.0000 mg | INTRAMUSCULAR | Status: AC | PRN

## 2020-12-20 MED ORDER — PHENYLEPHRINE HCL (PRESSORS) 10 MG/ML IV SOLN
INTRAVENOUS | Status: DC | PRN
  Administered 2020-12-20 (×4): 200 ug via INTRAVENOUS
  Administered 2020-12-20: 100 ug via INTRAVENOUS
  Administered 2020-12-20: 200 ug via INTRAVENOUS
  Administered 2020-12-20: 100 ug via INTRAVENOUS

## 2020-12-20 MED ORDER — SODIUM CHLORIDE 0.9 % IV SOLN: INTRAVENOUS | Status: DC

## 2020-12-20 MED ORDER — IPRATROPIUM-ALBUTEROL 0.5-2.5 (3) MG/3ML IN SOLN: 3.0000 mL | Freq: Once | RESPIRATORY_TRACT | Status: AC

## 2020-12-20 NOTE — Anesthesia Postprocedure Evaluation (Signed)
Anesthesia Post Note  Patient: Fernando [name]  Procedure(s) Performed: ESOPHAGOGASTRODUODENOSCOPY (EGD)  Patient location during evaluation: PACU Anesthesia Type: MAC Level of consciousness: awake Pain management: pain level controlled Vital Signs Assessment: post-procedure vital signs reviewed and stable Respiratory status: spontaneous breathing and patient connected to nasal cannula oxygen Cardiovascular status: blood pressure returned to baseline Anesthetic complications: no   No notable events documented.   Last Vitals:  Vitals:   12/20/20 1030 12/20/20 1130  BP: 128/85 112/75  Pulse: (!) 112 99  Resp: (!) 24 (!) 21  Temp:  36.4 C  SpO2: 97% 100%    Last Pain:  Vitals:   12/20/20 1130  TempSrc: Oral  PainSc:                  [name]

## 2020-12-20 NOTE — Evaluation (Signed)
Occupational Therapy Evaluation Patient Details Name: Fernando [name] MRN: [id number] DOB: 07-13-1955 Today's Date: 12/20/2020   History of Present Illness Pt is a 65 y.o. male presenting to hospital 9/15 with progressive generalized weakness (s/p fall).  Pt admitted with alcoholic ketoacidosis, metabolic acidosis, and generalized weakness.  PMH includes alcohol dependence, chronic systolic CHF, htn, PNA, acute GI bleeding, pancreatic disease, sinus tachycardia, acute hypoxemic respiratory failure, R reverse shoulder arthroplasty 2020, knee surgery L.   Clinical Impression   Patient presenting with decreased Ind in self care, balance, functional mobility/transfers, endurance, and safety awareness. Patient with significant hallucinations during session. RN in room and reports giving pt medication for agitation prior to OT arrival. Per chart, pt lives at home alone and independently PTA. He is able to demonstrate figure four positioning for LB self care and washes face with set up A and increased time to sequence task. Pt is not aggressive towards therapist but does refuse standing/OOB activities. He becomes lethargic, likely due to medications, and falls asleep and session terminated. Patient will benefit from acute OT to increase overall independence in the areas of ADLs, functional mobility, and safety awareness in order to safely discharge to next venue of care.      Recommendations for follow up therapy are one component of a multi-disciplinary discharge planning process, led by the attending physician.  Recommendations may be updated based on patient status, additional functional criteria and insurance authorization.   Follow Up Recommendations  SNF;Supervision/Assistance - 24 hour    Equipment Recommendations  Other (comment) (defer to next venue of care)       Precautions / Restrictions Precautions Precautions: Fall Restrictions Weight Bearing Restrictions: No      Mobility Bed  Mobility Overal bed mobility: Needs Assistance Bed Mobility: Supine to Sit;Sit to Supine     Supine to sit: Min assist;HOB elevated Sit to supine: Min assist;HOB elevated        Transfers                 General transfer comment: Pt declined    Balance                                           ADL either performed or assessed with clinical judgement   ADL Overall ADL's : Needs assistance/impaired                                       General ADL Comments: Pt does demonstrate figure four position in bed to don/doff socks. He follows commands to wash face with increased time to sequence task.     Vision Patient Visual Report: No change from baseline              Pertinent Vitals/Pain Pain Assessment: No/denies pain     Hand Dominance Right   Extremity/Trunk Assessment Upper Extremity Assessment Upper Extremity Assessment: Generalized weakness   Lower Extremity Assessment Lower Extremity Assessment: Generalized weakness       Communication Communication Communication: HOH   Cognition Arousal/Alertness: Lethargic;Suspect due to medications Behavior During Therapy: Restless Overall Cognitive Status: Within Functional Limits for tasks assessed  General Comments: RN entered the room and reports pt given something for agitation prior to therapist arrival. Pt is very restless in bed and having auditory and visual hallucinations.              Home Living Family/patient expects to be discharged to:: Private residence Living Arrangements: Alone   Type of Home: House Home Access: Stairs to enter [employer] of Steps: 1/2 step to enter Entrance Stairs-Rails: Right Home Layout: Two level;Able to live on main level with bedroom/bathroom     Bathroom Shower/Tub: Walk-in shower   Bathroom Toilet: Handicapped height     Home Equipment: Environmental consultant - 2 wheels;Cane -  single point;Shower seat - built in   Additional Comments: Information obtained via PT evaluation for home set up and PLOF      Prior Functioning/Environment Level of Independence: Independent                 OT Problem List: Decreased strength;Decreased activity tolerance;Decreased safety awareness;Impaired balance (sitting and/or standing);Decreased knowledge of use of DME or AE;Decreased knowledge of precautions      OT Treatment/Interventions: Self-care/ADL training;Therapeutic exercise;Therapeutic activities;Cognitive remediation/compensation;Energy conservation;Patient/family education;DME and/or AE instruction;Balance training    OT Goals(Current goals can be found in the care plan section) Acute Rehab OT Goals Patient Stated Goal: none stated OT Goal Formulation: Patient unable to participate in goal setting Time For Goal Achievement: 01/03/21 Potential to Achieve Goals: Fair ADL Goals Pt Will Perform Grooming: with supervision Pt Will Perform Lower Body Dressing: with supervision Pt Will Transfer to Toilet: with supervision Pt Will Perform Toileting - Clothing Manipulation and hygiene: with supervision  OT Frequency: Min 2X/week   Barriers to D/C: Decreased caregiver support  lives alone          AM-PAC OT "6 Clicks" Daily Activity     Outcome Measure Help from another person eating meals?: A Little Help from another person taking care of personal grooming?: A Little Help from another person toileting, which includes using toliet, bedpan, or urinal?: A Lot Help from another person bathing (including washing, rinsing, drying)?: A Lot Help from another person to put on and taking off regular upper body clothing?: A Little Help from another person to put on and taking off regular lower body clothing?: A Lot 6 Click Score: 15   End of Session Nurse Communication: Mobility status  Activity Tolerance: Patient limited by lethargy Patient left: in bed;with call  bell/phone within reach;with bed alarm set  OT Visit Diagnosis: Unsteadiness on feet (R26.81);Muscle weakness (generalized) (M62.81)                Time: [id number] OT Time Calculation (min): 13 min Charges:  OT General Charges $OT Visit: 1 Visit OT Evaluation $OT Eval Moderate Complexity: [address], [place], OTR/L , [facility] ascom [phone]  12/20/20, 2:58 PM

## 2020-12-20 NOTE — Transfer of Care (Signed)
Immediate Anesthesia Transfer of Care Note  Patient: OLOF MARCIL  Procedure(s) Performed: ESOPHAGOGASTRODUODENOSCOPY (EGD)  Patient Location: PACU and Endoscopy Unit  Anesthesia Type:MAC  Level of Consciousness: drowsy and responds to stimulation  Airway & Oxygen Therapy: Patient Spontanous Breathing and Patient connected to nasal cannula oxygen  Post-op Assessment: Report given to RN and Post -op Vital signs reviewed and stable  Post vital signs: Reviewed and stable  Last Vitals:  Vitals Value Taken Time  BP 147/91 1003  Temp 67f1003  Pulse 90 1003  Resp 12 1003  SpO2 100 1003    Last Pain:  Vitals:   12/20/20 0953  TempSrc: Temporal  PainSc: Asleep         Complications: No notable events documented.

## 2020-12-20 NOTE — Progress Notes (Signed)
[facility] PROGRESS NOTE   BREIGHTON RATER [id number] DOB: July 03, 1955 DOA: 12/18/2020  PCP: [name], MD  Brief History/Interval Summary: 65 y.o. male with medical history significant for chronic diastolic CHF (EF [id number] in 08/2020);  ETOH dependence with history of DTs, history of prosthetic shoulder joint infection; HTN; and HLD who presents to the ER by EMS for evaluation of weakness.  Patient apparently sustained a fall at home as well.  In the emergency department he was noted to have symptoms and signs of alcohol withdrawal.  Electrolyte abnormalities noted on blood work.  Patient was hospitalized for further management.  Subsequently had a drop in hemoglobin.  Black stools were noted.  Gastroenterology was consulted.   Consultants: Gastroenterology  Procedures: EGD is planned for today  Antibiotics: Anti-infectives (From admission, onward)    Start     Dose/Rate Route Frequency Ordered Stop   12/18/20 0915  cefTRIAXone (ROCEPHIN) 2 g in sodium chloride 0.9 % 100 mL IVPB        2 g 200 mL/hr over 30 Minutes Intravenous  Once 12/18/20 0906 12/18/20 1110   12/18/20 0915  azithromycin (ZITHROMAX) 500 mg in sodium chloride 0.9 % 250 mL IVPB        500 mg 250 mL/hr over 60 Minutes Intravenous  Once 12/18/20 0906 12/18/20 1210       Subjective/Interval History: Patient noted to be distracted this morning.  Denies any pain issues anywhere.  No nausea vomiting.  Denies any shortness of breath.       Assessment/Plan:  Alcohol withdrawal syndrome/alcohol abuse Patient noted to be tremulous.  Also noted to be tachycardic this morning.  Denies any pain issues.  Remains on CIWA protocol.  Thiamine.  IV fluids.  Continue to monitor closely for decompensation.    Upper GI bleed/acute blood loss anemia Noted to have black stools by nursing staff.  Occult blood testing is positive.  Drop in hemoglobin noted although some of it was probably dilutional.  Transfuse 1 unit  of PRBC.  Hemoglobin responded and has been stable.   Previous records reviewed.  Looks like patient had an upper endoscopy in May 2022 by Dr. [name] in [place].  Showed benign nonobstructing upper esophageal stenosis.  Clean-based nonbleeding gastric ulcers were noted.  Portal hypertensive gastropathy was seen.  Duodenitis and nonbleeding duodenal ulcers were noted. Gastroenterology was subsequently consulted.  Placed on octreotide infusion.  PPI twice daily.  No ascites noted on CT abdomen and pelvis.  INR was 1.0.  Alcoholic ketoacidosis Seems to have improved with IV hydration.  Hyponatremia/hypokalemia Potassium has improved.  Magnesium was 1.7.  Sodium is stable.    Alcoholic liver cirrhosis No ascites noted on examination or on CT scan.  AST and ALT mildly elevated likely due to his alcoholism.  Will need outpatient follow-up for cirrhosis.  Counseling provided regarding alcohol use.  Thrombocytopenia Significant drop in platelet counts noted.  Likely due to bone marrow suppression from alcohol as well as history of liver cirrhosis.  No overt bleeding noted except as mentioned above.  Platelet counts are stable.  Not on any heparin products.    History of chronic diastolic CHF Holding diuretics.  Continue carvedilol.  Continue ARB.  Monitor volume status and blood pressures closely.  Pancreatic cyst Outpatient monitoring.  Physical deconditioning Will need PT and OT evaluation eventually.  DVT Prophylaxis: SCDs Code Status: Full code Family Communication: No family at bedside Disposition Plan: To be determined  Status is: Inpatient  Remains  inpatient appropriate because:Ongoing diagnostic testing needed not appropriate for outpatient work up and IV treatments appropriate due to intensity of illness or inability to take PO  Dispo: The patient is from: Home              Anticipated d/c is to: Home              Patient currently is not medically stable to d/c.   Difficult to  place patient No     Medications: Scheduled:  [MAR Hold] sodium chloride   Intravenous Once   [MAR Hold] allopurinol  100 mg Oral Daily   [MAR Hold] vitamin C  500 mg Oral BID   [MAR Hold] carvedilol  3.125 mg Oral BID   [MAR Hold] feeding supplement  1 Container Oral TID BM   [MAR Hold] LORazepam  0-4 mg Oral Q6H   Followed by   [id number] Hold] LORazepam  0-4 mg Oral Q12H   [MAR Hold] multivitamin with minerals  1 tablet Oral Daily   [MAR Hold] pantoprazole  40 mg Intravenous Q12H   [MAR Hold] thiamine injection  100 mg Intravenous Q24H   Continuous:  sodium chloride 75 mL/hr at 12/20/20 0933   sodium chloride     octreotide  (SANDOSTATIN)    IV infusion 50 mcg/hr (12/20/20 0133)   PRN:[MAR Hold] acetaminophen **OR** [MAR Hold] acetaminophen, [MAR Hold] albuterol, [MAR Hold] docusate sodium, [MAR Hold] LORazepam **OR** [MAR Hold] LORazepam, [MAR Hold] nicotine, [MAR Hold] ondansetron **OR** [MAR Hold] ondansetron (ZOFRAN) IV   Objective:  Vital Signs  Vitals:   12/20/20 0907 12/20/20 0953 12/20/20 1003 12/20/20 1006  BP: 117/79 (!) 82/52 (!) 147/91   Pulse: (!) 113 91 76 90  Resp: 17 18    Temp: 98.2 F (36.8 C) 98 F (36.7 C)    TempSrc: Temporal Temporal    SpO2: 93% 95% 100% 100%  Weight:      Height:        Intake/Output Summary (Last 24 hours) at 12/20/2020 1007 Last data filed at 12/20/2020 1004 Gross per 24 hour  Intake 2128.99 ml  Output 1800 ml  Net 328.99 ml    Filed Weights   12/18/20 0251 12/18/20 1854 12/19/20 1540  Weight: 74.8 kg 65.5 kg 61.3 kg    General appearance: Awake alert.  In no distress.  Distracted Resp: Clear to auscultation bilaterally.  Normal effort Cardio: S1-S2 is tachycardic regular.  No S3-S4.  No rubs murmurs or bruit GI: Abdomen is soft.  Nontender nondistended.  Bowel sounds are present normal.  No masses organomegaly Extremities: No edema.  Full range of motion of lower extremities. Neurologic: Disoriented this morning.  No  focal neurological deficits.    Lab Results:  Data Reviewed: I have personally reviewed following labs and imaging studies  CBC: Recent Labs  Lab 12/18/20 0307 12/19/20 0157 12/19/20 1242 12/20/20 0531  WBC 9.4 4.5  --  5.6  NEUTROABS 8.0*  --   --   --   HGB 11.1* 7.6* 10.3* 10.5*  HCT 32.4* 20.9* 28.5* 28.3*  MCV 108.7* 103.5*  --  97.3  PLT 103* 44*  --  48*     Basic Metabolic Panel: Recent Labs  Lab 12/18/20 0307 12/18/20 0920 12/19/20 0157 12/19/20 1242 12/20/20 0531  NA 131*  --  128* 130* 130*  K 3.9  --  2.8* 3.3* 3.7  CL 90*  --  87* 88* 89*  CO2 14*  --  28 26 31  GLUCOSE 113*  --  129* 87 145*  BUN 21  --  '20 16 11  '$ CREATININE 0.93  --  0.74 0.70 0.56*  CALCIUM 8.3*  --  8.5* 8.7* 8.4*  MG  --  1.7 1.7  --   --      GFR: Estimated Creatinine Clearance: 77.7 mL/min (A) (by C-G formula based on SCr of 0.56 mg/dL (L)).  Liver Function Tests: Recent Labs  Lab 12/18/20 0307 12/20/20 0531  AST 105* 68*  ALT 68* 38  ALKPHOS 163* 116  BILITOT 3.0* 2.3*  PROT 7.4 6.2*  ALBUMIN 4.0 3.5     Recent Labs  Lab 12/18/20 0920  LIPASE 133*    Recent Labs  Lab 12/19/20 0157  AMMONIA 35     Coagulation Profile: Recent Labs  Lab 12/18/20 0920 12/19/20 0403  INR 1.1 1.0     Cardiac Enzymes: Recent Labs  Lab 12/18/20 0920  CKTOTAL 123       Recent Results (from the past 240 hour(s))  Blood culture (single)     Status: None (Preliminary result)   Collection Time: 12/18/20  9:20 AM   Specimen: BLOOD  Result Value Ref Range Status   Specimen Description BLOOD  Final   Special Requests   Final    BAA Blood Culture results may not be optimal due to an excessive volume of blood received in culture bottles   Culture   Final    NO GROWTH 2 DAYS Performed at [facility], [address], [place], [place] [address]    Report Status PENDING  Incomplete  Resp Panel by RT-PCR (Flu A&B, Covid) Nasopharyngeal Swab     Status:  None   Collection Time: 12/18/20  9:22 AM   Specimen: Nasopharyngeal Swab; Nasopharyngeal(NP) swabs in vial transport medium  Result Value Ref Range Status   SARS Coronavirus 2 by RT PCR NEGATIVE NEGATIVE Final    Comment: (NOTE) SARS-CoV-2 target nucleic acids are NOT DETECTED.  The SARS-CoV-2 RNA is generally detectable in upper respiratory specimens during the acute phase of infection. The lowest concentration of SARS-CoV-2 viral copies this assay can detect is 138 copies/mL. A negative result does not preclude SARS-Cov-2 infection and should not be used as the sole basis for treatment or other patient management decisions. A negative result may occur with  improper specimen collection/handling, submission of specimen other than nasopharyngeal swab, presence of viral mutation(s) within the areas targeted by this assay, and inadequate number of viral copies(<138 copies/mL). A negative result must be combined with clinical observations, patient history, and epidemiological information. The expected result is Negative.  Fact Sheet for Patients:  EntrepreneurPulse.com.au  Fact Sheet for Healthcare Providers:  IncredibleEmployment.be  This test is no t yet approved or cleared by the [place] FDA and  has been authorized for detection and/or diagnosis of SARS-CoV-2 by FDA under an Emergency Use Authorization (EUA). This EUA will remain  in effect (meaning this test can be used) for the duration of the COVID-19 declaration under Section 564(b)(1) of the Act, 21 U.S.C.section 360bbb-3(b)(1), unless the authorization is terminated  or revoked sooner.       Influenza A by PCR NEGATIVE NEGATIVE Final   Influenza B by PCR NEGATIVE NEGATIVE Final    Comment: (NOTE) The Xpert Xpress SARS-CoV-2/FLU/RSV plus assay is intended as an aid in the diagnosis of influenza from Nasopharyngeal swab specimens and should not be used as a sole basis for  treatment. Nasal washings and aspirates are  unacceptable for Xpert Xpress SARS-CoV-2/FLU/RSV testing.  Fact Sheet for Patients: EntrepreneurPulse.com.au  Fact Sheet for Healthcare Providers: IncredibleEmployment.be  This test is not yet approved or cleared by the [place] FDA and has been authorized for detection and/or diagnosis of SARS-CoV-2 by FDA under an Emergency Use Authorization (EUA). This EUA will remain in effect (meaning this test can be used) for the duration of the COVID-19 declaration under Section 564(b)(1) of the Act, 21 U.S.C. section 360bbb-3(b)(1), unless the authorization is terminated or revoked.  Performed at [facility], [address] [place], [place] [address]   Culture, blood (single) w Reflex to ID Panel     Status: None (Preliminary result)   Collection Time: 12/19/20  1:57 AM   Specimen: BLOOD  Result Value Ref Range Status   Specimen Description BLOOD RIGHT HAND  Final   Special Requests   Final    BOTTLES DRAWN AEROBIC AND ANAEROBIC Blood Culture results may not be optimal due to an excessive volume of blood received in culture bottles   Culture   Final    NO GROWTH 1 DAY Performed at [facility], [address], [place], [place] [address]    Report Status PENDING  Incomplete       Radiology Studies: CT ABDOMEN PELVIS W CONTRAST  Result Date: 12/18/2020 CLINICAL DATA:  Nausea, vomiting, abdominal pain EXAM: CT ABDOMEN AND PELVIS WITH CONTRAST TECHNIQUE: Multidetector CT imaging of the abdomen and pelvis was performed using the standard protocol following bolus administration of intravenous contrast. CONTRAST:  161m OMNIPAQUE IOHEXOL 350 MG/ML SOLN COMPARISON:  05/01/2020 FINDINGS: Lower chest: Coronary artery and aortic calcifications. No acute abnormality. Hepatobiliary: Diffuse low-density throughout the liver compatible with fatty infiltration. No focal abnormality. Gallbladder  unremarkable. Pancreas: 3.3 cm cyst in the pancreatic tail compared to 4 cm previously. No ductal dilatation. Spleen: No focal abnormality.  Normal size. Adrenals/Urinary Tract: Bilateral renal cysts, the largest on the right measures 2.6 cm. No hydronephrosis. No stones. Adrenal glands and urinary bladder unremarkable. Stomach/Bowel: Normal appendix. Stomach, large and small bowel grossly unremarkable. Vascular/Lymphatic: Aortic atherosclerosis. No evidence of aneurysm or adenopathy. Reproductive: No visible focal abnormality. Other: No free fluid or free air. Musculoskeletal: Diffuse degenerative disc and facet disease. No acute bony abnormality. IMPRESSION: Hepatic steatosis. 3.3 cm cyst in the pancreatic tail compared to 4.1 cm previously. This may reflect a pseudo cyst, but continued follow-up recommended. Repeat CT in 6-12 months suggested. Aortic atherosclerosis, coronary artery disease. Electronically Signed   By: KRolm BaptiseM.D.   On: 12/18/2020 11:05   DG Chest Port 1 View  Result Date: 12/18/2020 CLINICAL DATA:  Shortness of breath EXAM: PORTABLE CHEST 1 VIEW COMPARISON:  Prior today and 08/29/2020 FINDINGS: The heart size and mediastinal contours are within normal limits. Low lung volumes are again seen. Chronic pulmonary interstitial prominence is again seen predominately in the lung bases, and without significant change. No evidence of superimposed pulmonary infiltrate or pleural effusion. Right shoulder prosthesis again noted IMPRESSION: Chronic bibasilar-predominant interstitial lung disease. No acute findings. Electronically Signed   By: JMarlaine HindM.D.   On: 12/18/2020 18:57       LOS: 2 days   [facility] Pager on [email or web address]  12/20/2020, 10:07 AM

## 2020-12-20 NOTE — Op Note (Signed)
[facility] Patient Name: Fernando [name] Procedure Date: 12/20/2020 9:19 AM MRN: [id number] Account #: [id number] Date of Birth: [birth year] Admit Type: Inpatient Age: 65 Room: [facility] ENDO ROOM 4 Gender: Male Note Status: Finalized Instrument Name: Michaelle Birks [id number] Procedure:             Upper GI endoscopy Indications:           Melena Providers:             Ahmiya Abee B. Bonna Gains MD, MD Referring MD:          [name] MD (Referring MD) Medicines:             Monitored Anesthesia Care Complications:         No immediate complications. Procedure:             Pre-Anesthesia Assessment:                        - The risks and benefits of the procedure and the                         sedation options and risks were discussed with the                         patient. All questions were answered and informed                         consent was obtained.                        - Patient identification and proposed procedure were                         verified prior to the procedure.                        - ASA Grade Assessment: III - A patient with severe                         systemic disease.                        After obtaining informed consent, the endoscope was                         passed under direct vision. Throughout the procedure,                         the patient's blood pressure, pulse, and oxygen                         saturations were monitored continuously. The Endoscope                         was introduced through the mouth, and advanced to the                         second part of duodenum. The upper GI endoscopy was  accomplished with ease. The patient tolerated the                         procedure well. Findings:      LA Grade B (one or more mucosal breaks greater than 5 mm, not extending       between the tops of two mucosal folds) esophagitis with no bleeding was       found in the  distal esophagus.      The exam of the esophagus was otherwise normal.      Few non-bleeding superficial gastric ulcers with a clean ulcer base       (Forrest Class III) were found in the gastric antrum. The largest lesion       was 5 mm in largest dimension.      The exam of the stomach was otherwise normal.      Patchy moderately erythematous mucosa without active bleeding and with       no stigmata of bleeding was found in the duodenal bulb.      The exam of the duodenum was otherwise normal.      Biopsies were not done on today's procedure due to patient's recent       melena, and to avoid confounding the clinical picture for potential       risks of bleeding from biopsy site. Impression:            - LA Grade B reflux esophagitis with no bleeding.                        - Non-bleeding gastric ulcers with a clean ulcer base                         (Forrest Class III).                        - Erythematous duodenopathy.                        - No specimens collected. Recommendation:        - Follow an antireflux regimen.                        - Use Protonix (pantoprazole) 40 mg PO BID for 8 weeks.                        - Return to GI clinic in 4 weeks.                        - Discuss possible repeat EGD in clinic                        - Perform an H. pylori serology.                        - Avoid NSAIDs except Aspirin if medically indicated                         by PCP                        - The findings and recommendations were discussed with  the patient.                        - Low sodium diet.                        - Return patient to hospital ward for ongoing care. Procedure Code(s):     --- Professional ---                        [phone], Esophagogastroduodenoscopy, flexible,                         transoral; diagnostic, including collection of                         specimen(s) by brushing or washing, when performed                          (separate procedure) Diagnosis Code(s):     --- Professional ---                        K21.00, Gastro-esophageal reflux disease with                         esophagitis, without bleeding                        K25.9, Gastric ulcer, unspecified as acute or chronic,                         without hemorrhage or perforation                        K31.89, Other diseases of stomach and duodenum                        K92.1, Melena (includes Hematochezia) CPT copyright 2019 American Medical Association. All rights reserved. The codes documented in this report are preliminary and upon coder review may  be revised to meet current compliance requirements.  Vonda Antigua, MD Margretta Sidle B. Bonna Gains MD, MD 12/20/2020 10:07:31 AM This report has been signed electronically. Number of Addenda: 0 Note Initiated On: 12/20/2020 9:19 AM      [facility]

## 2020-12-20 NOTE — Anesthesia Preprocedure Evaluation (Addendum)
Anesthesia Evaluation  Patient identified by MRN, date of birth, ID band Patient awake    Reviewed: Allergy & Precautions, NPO status , Patient's Chart, lab work & pertinent test results  History of Anesthesia Complications Negative for: history of anesthetic complications  Airway Mallampati: III  TM Distance: >3 FB Neck ROM: Full    Dental  (+) Dental Advisory Given, Teeth Intact   Pulmonary pneumonia (Chest x-ray: Left basilar pneumonia), Current Smoker and Patient abstained from smoking.,    Pulmonary exam normal breath sounds clear to auscultation       Cardiovascular hypertension, Pt. on home beta blockers and Pt. on medications +CHF  Normal cardiovascular exam Rhythm:Regular Rate:Normal  Study Highlights Perfusion study    The left ventricular ejection fraction is mildly decreased (45-54%).  Nuclear stress EF: 50%.  No T wave inversion was noted during stress.  There was no ST segment deviation noted during stress.  This is a low risk study.   No reversible ischemia. LVEF 50% with normal wall motion. This is a low risk study.    ECHO: 1. Cannot exclude apicoseptal mural thrombus. Left ventricular ejection fraction, by estimation, is 25 to 30%. The left ventricle has severely decreased function. The left ventricle demonstrates global hypokinesis. Left ventricular diastolic parameters are consistent with Grade I diastolic dysfunction (impaired relaxation). 2. Right ventricular systolic function is mildly reduced. The right ventricular size is moderately enlarged. Tricuspid regurgitation signal is inadequate for assessing PA pressure. 3. The mitral valve is normal in structure. No evidence of mitral valve regurgitation. 4. The aortic valve is normal in structure. Aortic valve regurgitation is not visualized. No aortic stenosis is present. 5. Aortic dilatation noted. There is mild dilatation of the aortic root,  measuring 39 mm.   Neuro/Psych PSYCHIATRIC DISORDERS Anxiety negative neurological ROS     GI/Hepatic negative GI ROS, (+)     substance abuse  alcohol use, Hepatitis -  Endo/Other  negative endocrine ROS  Renal/GU Renal InsufficiencyRenal disease     Musculoskeletal  (+) Arthritis ,   Abdominal   Peds  Hematology  (+) anemia , Dyslipidemia   Anesthesia Other Findings melena, upper GI bleed  IMPRESSIONS    1. Left ventricular ejection fraction, by estimation, is 55 to 60%. The  left ventricle has normal function. The left ventricle has no regional  wall motion abnormalities. The left ventricular internal cavity size was  mildly dilated. There is mild left  ventricular hypertrophy. Left ventricular diastolic parameters were  normal.  2. Right ventricular systolic function is normal. The right ventricular  size is mildly enlarged. Tricuspid regurgitation signal is inadequate for  assessing PA pressure.  3. Left atrial size was mildly dilated.  4. The mitral valve is normal in structure. No evidence of mitral valve  regurgitation.  5. The aortic valve was not well visualized. Aortic valve regurgitation  is not visualized. No aortic stenosis is present.  6. The inferior vena cava is normal in size with greater than 50%  respiratory variability, suggesting right atrial pressure of 3 mmHg EF was 25% 11/21 while in CHF  Stress 2021-no reversible ischemia  ETOH abuse-history of DTs     Reproductive/Obstetrics                            Anesthesia Physical Anesthesia Plan  ASA: 3  Anesthesia Plan: MAC   Post-op Pain Management:    Induction:   PONV Risk Score and Plan:  Airway Management Planned:   Additional Equipment:   Intra-op Plan:   Post-operative Plan:   Informed Consent:   Plan Discussed with:   Anesthesia Plan Comments:         Anesthesia Quick Evaluation

## 2020-12-21 ENCOUNTER — Inpatient Hospital Stay: Payer: Medicare Other

## 2020-12-21 DIAGNOSIS — F10239 Alcohol dependence with withdrawal, unspecified: Secondary | ICD-10-CM | POA: Diagnosis not present

## 2020-12-21 DIAGNOSIS — K921 Melena: Secondary | ICD-10-CM | POA: Diagnosis not present

## 2020-12-21 DIAGNOSIS — K922 Gastrointestinal hemorrhage, unspecified: Secondary | ICD-10-CM | POA: Diagnosis not present

## 2020-12-21 DIAGNOSIS — I10 Essential (primary) hypertension: Secondary | ICD-10-CM | POA: Diagnosis not present

## 2020-12-21 DIAGNOSIS — K7030 Alcoholic cirrhosis of liver without ascites: Secondary | ICD-10-CM | POA: Diagnosis not present

## 2020-12-21 DIAGNOSIS — D62 Acute posthemorrhagic anemia: Secondary | ICD-10-CM | POA: Diagnosis not present

## 2020-12-21 LAB — COMPREHENSIVE METABOLIC PANEL
ALT: 57 U/L — ABNORMAL HIGH (ref 0–44)
AST: 132 U/L — ABNORMAL HIGH (ref 15–41)
Albumin: 3 g/dL — ABNORMAL LOW (ref 3.5–5.0)
Alkaline Phosphatase: 113 U/L (ref 38–126)
Anion gap: 8 (ref 5–15)
BUN: 8 mg/dL (ref 8–23)
CO2: 31 mmol/L (ref 22–32)
Calcium: 8.3 mg/dL — ABNORMAL LOW (ref 8.9–10.3)
Chloride: 91 mmol/L — ABNORMAL LOW (ref 98–111)
Creatinine, Ser: 0.64 mg/dL (ref 0.61–1.24)
GFR, Estimated: >60 mL/min (ref >60)
Glucose, Bld: 117 mg/dL — ABNORMAL HIGH (ref 70–99)
Potassium: 3.2 mmol/L — ABNORMAL LOW (ref 3.5–5.1)
Sodium: 130 mmol/L — ABNORMAL LOW (ref 135–145)
Total Bilirubin: 1.9 mg/dL — ABNORMAL HIGH (ref 0.3–1.2)
Total Protein: 5.6 g/dL — ABNORMAL LOW (ref 6.5–8.1)

## 2020-12-21 LAB — CBC
HCT: 25.7 % — ABNORMAL LOW (ref 39.0–52.0)
Hemoglobin: 9.3 g/dL — ABNORMAL LOW (ref 13.0–17.0)
MCH: 35.6 pg — ABNORMAL HIGH (ref 26.0–34.0)
MCHC: 36.2 g/dL — ABNORMAL HIGH (ref 30.0–36.0)
MCV: 98.5 fL (ref 80.0–100.0)
Platelets: 44 10*3/uL — ABNORMAL LOW (ref 150–400)
RBC: 2.61 MIL/uL — ABNORMAL LOW (ref 4.22–5.81)
RDW: 16.6 % — ABNORMAL HIGH (ref 11.5–15.5)
WBC: 4 10*3/uL (ref 4.0–10.5)
nRBC: 0 % (ref 0.0–0.2)

## 2020-12-21 LAB — MAGNESIUM: Magnesium: 1.6 mg/dL — ABNORMAL LOW (ref 1.7–2.4)

## 2020-12-21 MED ORDER — MAGNESIUM SULFATE 4 GM/100ML IV SOLN
4.0000 g | Freq: Once | INTRAVENOUS | Status: AC
  Administered 2020-12-21: 4 g via INTRAVENOUS
  Filled 2020-12-21: qty 100

## 2020-12-21 MED ORDER — POTASSIUM CHLORIDE CRYS ER 20 MEQ PO TBCR
40.0000 meq | EXTENDED_RELEASE_TABLET | ORAL | Status: AC
  Administered 2020-12-21 (×2): 40 meq via ORAL
  Filled 2020-12-21 (×2): qty 2

## 2020-12-21 MED ORDER — AMOXICILLIN-POT CLAVULANATE 875-125 MG PO TABS
1.0000 | ORAL_TABLET | Freq: Two times a day (BID) | ORAL | Status: DC
  Administered 2020-12-21 – 2020-12-24 (×7): 1 via ORAL
  Filled 2020-12-21 (×8): qty 1

## 2020-12-21 MED ORDER — FUROSEMIDE 10 MG/ML IJ SOLN
20.0000 mg | Freq: Once | INTRAMUSCULAR | Status: AC
  Administered 2020-12-21: 20 mg via INTRAVENOUS
  Filled 2020-12-21: qty 2

## 2020-12-21 NOTE — Progress Notes (Signed)
[facility] PROGRESS NOTE   Fernando [name] [id number] DOB: May 17, 1955 DOA: 12/18/2020  PCP: [name], MD  Brief History/Interval Summary: 65 y.o. male with medical history significant for chronic diastolic CHF (EF [id number] in 08/2020);  ETOH dependence with history of DTs, history of prosthetic shoulder joint infection; HTN; and HLD who presents to the ER by EMS for evaluation of weakness.  Patient apparently sustained a fall at home as well.  In the emergency department he was noted to have symptoms and signs of alcohol withdrawal.  Electrolyte abnormalities noted on blood work.  Patient was hospitalized for further management.  Subsequently had a drop in hemoglobin.  Black stools were noted.  Gastroenterology was consulted.   Consultants: Gastroenterology  Procedures:  EGD 9/17 Impression:            - LA Grade B reflux esophagitis with no bleeding.                        - Non-bleeding gastric ulcers with a clean ulcer base                         (Forrest Class III).                        - Erythematous duodenopathy.                        - No specimens collected.  Antibiotics: Anti-infectives (From admission, onward)    Start     Dose/Rate Route Frequency Ordered Stop   12/18/20 0915  cefTRIAXone (ROCEPHIN) 2 g in sodium chloride 0.9 % 100 mL IVPB        2 g 200 mL/hr over 30 Minutes Intravenous  Once 12/18/20 0906 12/18/20 1110   12/18/20 0915  azithromycin (ZITHROMAX) 500 mg in sodium chloride 0.9 % 250 mL IVPB        500 mg 250 mL/hr over 60 Minutes Intravenous  Once 12/18/20 0906 12/18/20 1210       Subjective/Interval History: Patient noted to be less distracted today compared to yesterday.  Denies any nausea vomiting.  Noted to have a cough.      Assessment/Plan:  Alcohol withdrawal syndrome/alcohol abuse Patient remains on CIWA protocol.  Heart rate seems to be better today compared to yesterday.  Continue thiamine.  Mentation seems to be  improving.  Continue to monitor closely for decompensation.  Upper GI bleed/acute blood loss anemia Noted to have black stools by nursing staff.  Occult blood testing is positive.  Drop in hemoglobin noted although some of it was probably dilutional.  Transfused 1 unit of PRBC.  Hemoglobin responded and has been stable.   Previous records reviewed.  Looks like patient had an upper endoscopy in May 2022 by Dr. [name] in [place].  Showed benign nonobstructing upper esophageal stenosis.  Clean-based nonbleeding gastric ulcers were noted.  Portal hypertensive gastropathy was seen.  Duodenitis and nonbleeding duodenal ulcers were noted. Gastroenterology was subsequently consulted.  Placed on octreotide infusion.  PPI twice daily.  No ascites noted on CT abdomen and pelvis.  INR was 1.0. Patient underwent EGD on 9/17 which showed esophagitis.  Nonbleeding gastric ulcers were also noted.  Erythematous duodenopathy was noted.  No varices were noted.  Octreotide discontinued.  Continue PPI.  Monitor hemoglobin.  Cough Noted to be on oxygen.  Chest x-ray was done which  could raise concern for volume overload.  Patient also noted to have coughing episode after he had something to drink.  Will request speech therapy to evaluate.  He is afebrile with a normal white count.  No indication for antibiotics currently.  Alcoholic ketoacidosis Seems to have improved with IV hydration.  Hyponatremia/hypokalemia/hypomagnesemia Continue to replace potassium and magnesium.  Sodium level is low but stable.  Likely due to liver cirrhosis.    Alcoholic liver cirrhosis No ascites noted on examination or on CT scan.  AST and ALT elevated likely due to his alcoholism.  Will need outpatient follow-up for cirrhosis.  Counseling provided regarding alcohol use.  Hepatitis panel was unremarkable in July 2021.  Thrombocytopenia Significant drop in platelet counts noted.  Likely due to bone marrow suppression from alcohol as well  as history of liver cirrhosis.  No overt bleeding noted except as mentioned above.   Platelet counts are low but stable.  Not on any heparin products.    History of chronic diastolic CHF He remains on carvedilol.  Chest x-ray was done this morning which could suggest a degree of volume overload.  He is not on IV fluids anymore.  Could give him a dose of furosemide today.  Blood pressure is stable.  Pancreatic cyst Outpatient monitoring.  Physical deconditioning Will need PT and OT evaluation eventually.  DVT Prophylaxis: SCDs Code Status: Full code Family Communication: No family at bedside Disposition Plan: To be determined  Status is: Inpatient  Remains inpatient appropriate because:Ongoing diagnostic testing needed not appropriate for outpatient work up and IV treatments appropriate due to intensity of illness or inability to take PO  Dispo: The patient is from: Home              Anticipated d/c is to: Home              Patient currently is not medically stable to d/c.   Difficult to place patient No     Medications: Scheduled:  sodium chloride   Intravenous Once   allopurinol  100 mg Oral Daily   vitamin C  500 mg Oral BID   carvedilol  3.125 mg Oral BID   feeding supplement  1 Container Oral TID BM   LORazepam  0-4 mg Intravenous Q6H   Followed by   Derrill Memo ON 12/22/2020] LORazepam  0-4 mg Intravenous Q12H   multivitamin with minerals  1 tablet Oral Daily   pantoprazole  40 mg Intravenous Q12H   potassium chloride  40 mEq Oral Q4H   thiamine injection  100 mg Intravenous Q24H   Continuous:   [id number] **OR** acetaminophen, albuterol, docusate sodium, LORazepam **OR** LORazepam, nicotine, ondansetron **OR** ondansetron (ZOFRAN) IV   Objective:  Vital Signs  Vitals:   12/20/20 2300 12/20/20 2314 12/21/20 0314 12/21/20 0719  BP:  103/64 102/62 108/68  Pulse: 89 89 90 88  Resp:  '17 17 18  '$ Temp:  98.2 F (36.8 C) 97.9 F (36.6 C) 98 F (36.7 C)   TempSrc:      SpO2:  96% (!) 88% 94%  Weight:      Height:        Intake/Output Summary (Last 24 hours) at 12/21/2020 1131 Last data filed at 12/20/2020 2023 Gross per 24 hour  Intake 231.28 ml  Output 1051 ml  Net -819.72 ml    Filed Weights   12/18/20 0251 12/18/20 1854 12/19/20 1540  Weight: 74.8 kg 65.5 kg 61.3 kg    General appearance: Awake alert.  In no distress Resp: Mildly tachypneic without use of accessory muscles.  Scattered wheezing appreciated.  No rhonchi.  Few crackles at the bases. Cardio: S1-S2 is normal regular.  No S3-S4.  No rubs murmurs or bruit GI: Abdomen is soft.  Nontender nondistended.  Bowel sounds are present normal.  No masses organomegaly Extremities: No edema.  Full range of motion of lower extremities. Neurologic: More alert.  Today he is noted to be oriented to place year month.  No focal deficits.   Lab Results:  Data Reviewed: I have personally reviewed following labs and imaging studies  CBC: Recent Labs  Lab 12/18/20 0307 12/19/20 0157 12/19/20 1242 12/20/20 0531 12/21/20 0545  WBC 9.4 4.5  --  5.6 4.0  NEUTROABS 8.0*  --   --   --   --   HGB 11.1* 7.6* 10.3* 10.5* 9.3*  HCT 32.4* 20.9* 28.5* 28.3* 25.7*  MCV 108.7* 103.5*  --  97.3 98.5  PLT 103* 44*  --  48* 44*     Basic Metabolic Panel: Recent Labs  Lab 12/18/20 0307 12/18/20 0920 12/19/20 0157 12/19/20 1242 12/20/20 0531 12/21/20 0545  NA 131*  --  128* 130* 130* 130*  K 3.9  --  2.8* 3.3* 3.7 3.2*  CL 90*  --  87* 88* 89* 91*  CO2 14*  --  '28 26 31 31  '$ GLUCOSE 113*  --  129* 87 145* 117*  BUN 21  --  '20 16 11 8  '$ CREATININE 0.93  --  0.74 0.70 0.56* 0.64  CALCIUM 8.3*  --  8.5* 8.7* 8.4* 8.3*  MG  --  1.7 1.7  --   --  1.6*     GFR: Estimated Creatinine Clearance: 77.7 mL/min (by C-G formula based on SCr of 0.64 mg/dL).  Liver Function Tests: Recent Labs  Lab 12/18/20 0307 12/20/20 0531 12/21/20 0545  AST 105* 68* 132*  ALT 68* 38 57*  ALKPHOS  163* 116 113  BILITOT 3.0* 2.3* 1.9*  PROT 7.4 6.2* 5.6*  ALBUMIN 4.0 3.5 3.0*     Recent Labs  Lab 12/18/20 0920  LIPASE 133*    Recent Labs  Lab 12/19/20 0157  AMMONIA 35     Coagulation Profile: Recent Labs  Lab 12/18/20 0920 12/19/20 0403  INR 1.1 1.0     Cardiac Enzymes: Recent Labs  Lab 12/18/20 0920  CKTOTAL 123       Recent Results (from the past 240 hour(s))  Blood culture (single)     Status: None (Preliminary result)   Collection Time: 12/18/20  9:20 AM   Specimen: BLOOD  Result Value Ref Range Status   Specimen Description BLOOD  Final   Special Requests   Final    BAA Blood Culture results may not be optimal due to an excessive volume of blood received in culture bottles   Culture   Final    NO GROWTH 3 DAYS Performed at [facility], [address], [place], [place] [address]    Report Status PENDING  Incomplete  Resp Panel by RT-PCR (Flu A&B, Covid) Nasopharyngeal Swab     Status: None   Collection Time: 12/18/20  9:22 AM   Specimen: Nasopharyngeal Swab; Nasopharyngeal(NP) swabs in vial transport medium  Result Value Ref Range Status   SARS Coronavirus 2 by RT PCR NEGATIVE NEGATIVE Final    Comment: (NOTE) SARS-CoV-2 target nucleic acids are NOT DETECTED.  The SARS-CoV-2 RNA is generally detectable in upper respiratory specimens during  the acute phase of infection. The lowest concentration of SARS-CoV-2 viral copies this assay can detect is 138 copies/mL. A negative result does not preclude SARS-Cov-2 infection and should not be used as the sole basis for treatment or other patient management decisions. A negative result may occur with  improper specimen collection/handling, submission of specimen other than nasopharyngeal swab, presence of viral mutation(s) within the areas targeted by this assay, and inadequate number of viral copies(<138 copies/mL). A negative result must be combined with clinical observations, patient  history, and epidemiological information. The expected result is Negative.  Fact Sheet for Patients:  EntrepreneurPulse.com.au  Fact Sheet for Healthcare Providers:  IncredibleEmployment.be  This test is no t yet approved or cleared by the [place] FDA and  has been authorized for detection and/or diagnosis of SARS-CoV-2 by FDA under an Emergency Use Authorization (EUA). This EUA will remain  in effect (meaning this test can be used) for the duration of the COVID-19 declaration under Section 564(b)(1) of the Act, 21 U.S.C.section 360bbb-3(b)(1), unless the authorization is terminated  or revoked sooner.       Influenza A by PCR NEGATIVE NEGATIVE Final   Influenza B by PCR NEGATIVE NEGATIVE Final    Comment: (NOTE) The Xpert Xpress SARS-CoV-2/FLU/RSV plus assay is intended as an aid in the diagnosis of influenza from Nasopharyngeal swab specimens and should not be used as a sole basis for treatment. Nasal washings and aspirates are unacceptable for Xpert Xpress SARS-CoV-2/FLU/RSV testing.  Fact Sheet for Patients: EntrepreneurPulse.com.au  Fact Sheet for Healthcare Providers: IncredibleEmployment.be  This test is not yet approved or cleared by the [place] FDA and has been authorized for detection and/or diagnosis of SARS-CoV-2 by FDA under an Emergency Use Authorization (EUA). This EUA will remain in effect (meaning this test can be used) for the duration of the COVID-19 declaration under Section 564(b)(1) of the Act, 21 U.S.C. section 360bbb-3(b)(1), unless the authorization is terminated or revoked.  Performed at [facility], [address] [place], [place] [address]   Culture, blood (single) w Reflex to ID Panel     Status: None (Preliminary result)   Collection Time: 12/19/20  1:57 AM   Specimen: BLOOD  Result Value Ref Range Status   Specimen Description BLOOD RIGHT HAND   Final   Special Requests   Final    BOTTLES DRAWN AEROBIC AND ANAEROBIC Blood Culture results may not be optimal due to an excessive volume of blood received in culture bottles   Culture   Final    NO GROWTH 2 DAYS Performed at [facility], [address], [place], [place] [address]    Report Status PENDING  Incomplete       Radiology Studies: No results found.     LOS: 3 days   Shanetha Bradham [employer] on [email or web address]  12/21/2020, 11:31 AM

## 2020-12-21 NOTE — Progress Notes (Addendum)
Fernando Antigua, MD [address], [place], [place], [place], [address] [address], [place], [place], [place], [address] Phone: [phone]  Fax: [phone]   Subjective:  Flowsheets document 1 black bowel movement yesterday at 2023.  No abdominal pain, or hematemesis.  Objective: Exam: Vital signs in last 24 hours: Vitals:   12/20/20 2314 12/21/20 0314 12/21/20 0719 12/21/20 1234  BP: 103/64 102/62 108/68 101/85  Pulse: 89 90 88 100  Resp: '17 17 18 18  '$ Temp: 98.2 F (36.8 C) 97.9 F (36.6 C) 98 F (36.7 C) 98.5 F (36.9 C)  TempSrc:    Oral  SpO2: 96% (!) 88% 94% 93%  Weight:      Height:       Weight change:   Intake/Output Summary (Last 24 hours) at 12/21/2020 1417 Last data filed at 12/21/2020 1401 Gross per 24 hour  Intake 471.28 ml  Output 2201 ml  Net -1729.72 ml    Constitutional: General:   Alert,  Well-developed, well-nourished, pleasant and cooperative in NAD BP 101/85 (BP Location: Right Arm)   Pulse 100   Temp 98.5 F (36.9 C) (Oral)   Resp 18   Ht '5\' 5"'$  (1.651 m)   Wt 61.3 kg   SpO2 93%   BMI 22.48 kg/m   Eyes:  Sclera clear, no icterus.   Conjunctiva pink.   Ears:  No scars, lesions or masses, Normal auditory acuity. Nose:  No deformity, discharge, or lesions. Mouth:  No deformity or lesions, oropharynx pink & moist.  Neck:  Supple; no masses, trachea midline  Respiratory: Normal respiratory effort  Gastrointestinal:  Soft, non-tender and non-distended without masses, hepatosplenomegaly or hernias noted.  No guarding or rebound tenderness.     Cardiac: No clubbing or edema.  No cyanosis. Normal posterior tibial pedal pulses noted.  Lymphatic:  No significant cervical adenopathy.  Psych:  Alert and cooperative. Normal mood and affect.  Musculoskeletal:  Head normocephalic, atraumatic. 5/5 Lower extremity strength bilaterally.  Skin: Warm. Intact without significant lesions or rashes. No jaundice.  Neurologic:  Face  symmetrical, tongue midline, Normal sensation to touch  Psych:  Alert and oriented x3, Alert and cooperative. Normal mood and affect.   Lab Results: Lab Results  Component Value Date   WBC 4.0 12/21/2020   HGB 9.3 (L) 12/21/2020   HCT 25.7 (L) 12/21/2020   MCV 98.5 12/21/2020   PLT 44 (L) 12/21/2020   Micro Results: Recent Results (from the past 240 hour(s))  Blood culture (single)     Status: None (Preliminary result)   Collection Time: 12/18/20  9:20 AM   Specimen: BLOOD  Result Value Ref Range Status   Specimen Description BLOOD  Final   Special Requests   Final    BAA Blood Culture results may not be optimal due to an excessive volume of blood received in culture bottles   Culture   Final    NO GROWTH 3 DAYS Performed at [facility], [address], [place],  [address]    Report Status PENDING  Incomplete  Resp Panel by RT-PCR (Flu A&B, Covid) Nasopharyngeal Swab     Status: None   Collection Time: 12/18/20  9:22 AM   Specimen: Nasopharyngeal Swab; Nasopharyngeal(NP) swabs in vial transport medium  Result Value Ref Range Status   SARS Coronavirus 2 by RT PCR NEGATIVE NEGATIVE Final    Comment: (NOTE) SARS-CoV-2 target nucleic acids are NOT DETECTED.  The SARS-CoV-2 RNA is generally detectable in upper respiratory specimens  during the acute phase of infection. The lowest concentration of SARS-CoV-2 viral copies this assay can detect is 138 copies/mL. A negative result does not preclude SARS-Cov-2 infection and should not be used as the sole basis for treatment or other patient management decisions. A negative result may occur with  improper specimen collection/handling, submission of specimen other than nasopharyngeal swab, presence of viral mutation(s) within the areas targeted by this assay, and inadequate number of viral copies(<138 copies/mL). A negative result must be combined with clinical observations, patient history, and  epidemiological information. The expected result is Negative.  Fact Sheet for Patients:  EntrepreneurPulse.com.au  Fact Sheet for Healthcare Providers:  IncredibleEmployment.be  This test is no t yet approved or cleared by the [place] FDA and  has been authorized for detection and/or diagnosis of SARS-CoV-2 by FDA under an Emergency Use Authorization (EUA). This EUA will remain  in effect (meaning this test can be used) for the duration of the COVID-19 declaration under Section 564(b)(1) of the Act, 21 U.S.C.section 360bbb-3(b)(1), unless the authorization is terminated  or revoked sooner.       Influenza A by PCR NEGATIVE NEGATIVE Final   Influenza B by PCR NEGATIVE NEGATIVE Final    Comment: (NOTE) The Xpert Xpress SARS-CoV-2/FLU/RSV plus assay is intended as an aid in the diagnosis of influenza from Nasopharyngeal swab specimens and should not be used as a sole basis for treatment. Nasal washings and aspirates are unacceptable for Xpert Xpress SARS-CoV-2/FLU/RSV testing.  Fact Sheet for Patients: EntrepreneurPulse.com.au  Fact Sheet for Healthcare Providers: IncredibleEmployment.be  This test is not yet approved or cleared by the [place] FDA and has been authorized for detection and/or diagnosis of SARS-CoV-2 by FDA under an Emergency Use Authorization (EUA). This EUA will remain in effect (meaning this test can be used) for the duration of the COVID-19 declaration under Section 564(b)(1) of the Act, 21 U.S.C. section 360bbb-3(b)(1), unless the authorization is terminated or revoked.  Performed at [facility], [address] [place], [place] [address]   Culture, blood (single) w Reflex to ID Panel     Status: None (Preliminary result)   Collection Time: 12/19/20  1:57 AM   Specimen: BLOOD  Result Value Ref Range Status   Specimen Description BLOOD RIGHT HAND  Final    Special Requests   Final    BOTTLES DRAWN AEROBIC AND ANAEROBIC Blood Culture results may not be optimal due to an excessive volume of blood received in culture bottles   Culture   Final    NO GROWTH 2 DAYS Performed at [facility], [address] [place], [place] [address]    Report Status PENDING  Incomplete   Studies/Results: DG Chest Port 1 View  Result Date: 12/21/2020 CLINICAL DATA:  Wheezing. Alcoholic ketoacidosis. Metabolic acidosis. EXAM: PORTABLE CHEST 1 VIEW COMPARISON:  None. FINDINGS: No pneumothorax. The cardiomediastinal silhouette is stable.a bibasilar opacities are identified, left greater than right. No other acute abnormalities. IMPRESSION: New bibasilar opacities, left greater than right, concerning for pneumonia in the appropriate clinical setting. Atelectasis is possible. Recommend clinical correlation. Electronically Signed   By: Dorise Bullion III M.D.   On: 12/21/2020 12:08   Medications:  Scheduled Meds:  sodium chloride   Intravenous Once   allopurinol  100 mg Oral Daily   amoxicillin-clavulanate  1 tablet Oral Q12H   vitamin C  500 mg Oral BID   carvedilol  3.125 mg Oral BID   feeding supplement  1 Container Oral  TID BM   LORazepam  0-4 mg Intravenous Q6H   Followed by   Derrill Memo ON 12/22/2020] LORazepam  0-4 mg Intravenous Q12H   multivitamin with minerals  1 tablet Oral Daily   pantoprazole  40 mg Intravenous Q12H   thiamine injection  100 mg Intravenous Q24H   Continuous Infusions:  magnesium sulfate bolus IVPB 4 g (12/21/20 1317)   PRN Meds:.acetaminophen **OR** acetaminophen, albuterol, docusate sodium, LORazepam **OR** LORazepam, nicotine, ondansetron **OR** ondansetron (ZOFRAN) IV   Assessment: Melena Anemia  Plan: Patient had 1 black bowel movement last night  Hemoglobin did drop slightly this morning, but is not accompanied with any changes in hemodynamic status.  Therefore, the melanotic bowel movement last night may have been  old blood instead of new bleeding.  Would not recommend repeat EGD at this time  Continue protonix 40 mg IV twice daily Continue to monitor hemoglobin closely  If hemoglobin drops further patient continues to have melanotic bowel movements, colonoscopy may need to be considered  Dr. Allen Norris to follow the patient from tomorrow  Please page on-call GI with any signs of active GI bleeding     LOS: 3 days   Fernando Antigua, MD 12/21/2020, 2:17 PM

## 2020-12-22 ENCOUNTER — Encounter: Payer: Self-pay | Admitting: Gastroenterology

## 2020-12-22 DIAGNOSIS — E872 Acidosis: Secondary | ICD-10-CM | POA: Diagnosis not present

## 2020-12-22 DIAGNOSIS — J690 Pneumonitis due to inhalation of food and vomit: Secondary | ICD-10-CM | POA: Diagnosis not present

## 2020-12-22 DIAGNOSIS — K209 Esophagitis, unspecified without bleeding: Secondary | ICD-10-CM | POA: Diagnosis not present

## 2020-12-22 DIAGNOSIS — I10 Essential (primary) hypertension: Secondary | ICD-10-CM | POA: Diagnosis not present

## 2020-12-22 DIAGNOSIS — F10239 Alcohol dependence with withdrawal, unspecified: Secondary | ICD-10-CM | POA: Diagnosis not present

## 2020-12-22 LAB — GLUCOSE, CAPILLARY
Glucose-Capillary: 152 mg/dL — ABNORMAL HIGH (ref 70–99)
Glucose-Capillary: 132 mg/dL — ABNORMAL HIGH (ref 70–99)

## 2020-12-22 LAB — TYPE AND SCREEN
ABO/RH(D): O POS
Antibody Screen: NEGATIVE

## 2020-12-22 LAB — COMPREHENSIVE METABOLIC PANEL
ALT: 92 U/L — ABNORMAL HIGH (ref 0–44)
AST: 171 U/L — ABNORMAL HIGH (ref 15–41)
Albumin: 3 g/dL — ABNORMAL LOW (ref 3.5–5.0)
Alkaline Phosphatase: 127 U/L — ABNORMAL HIGH (ref 38–126)
Anion gap: 4 — ABNORMAL LOW (ref 5–15)
BUN: 14 mg/dL (ref 8–23)
CO2: 30 mmol/L (ref 22–32)
Calcium: 8.2 mg/dL — ABNORMAL LOW (ref 8.9–10.3)
Chloride: 95 mmol/L — ABNORMAL LOW (ref 98–111)
Creatinine, Ser: 0.66 mg/dL (ref 0.61–1.24)
GFR, Estimated: >60 mL/min (ref >60)
Glucose, Bld: 113 mg/dL — ABNORMAL HIGH (ref 70–99)
Potassium: 4 mmol/L (ref 3.5–5.1)
Sodium: 129 mmol/L — ABNORMAL LOW (ref 135–145)
Total Bilirubin: 1.4 mg/dL — ABNORMAL HIGH (ref 0.3–1.2)
Total Protein: 5.6 g/dL — ABNORMAL LOW (ref 6.5–8.1)

## 2020-12-22 LAB — CBC
HCT: 25.7 % — ABNORMAL LOW (ref 39.0–52.0)
Hemoglobin: 9 g/dL — ABNORMAL LOW (ref 13.0–17.0)
MCH: 34.9 pg — ABNORMAL HIGH (ref 26.0–34.0)
MCHC: 35 g/dL (ref 30.0–36.0)
MCV: 99.6 fL (ref 80.0–100.0)
Platelets: 60 10*3/uL — ABNORMAL LOW (ref 150–400)
RBC: 2.58 MIL/uL — ABNORMAL LOW (ref 4.22–5.81)
RDW: 16.3 % — ABNORMAL HIGH (ref 11.5–15.5)
WBC: 4.4 10*3/uL (ref 4.0–10.5)
nRBC: 0 % (ref 0.0–0.2)

## 2020-12-22 LAB — HEMOGLOBIN AND HEMATOCRIT, BLOOD
HCT: 24.9 % — ABNORMAL LOW (ref 39.0–52.0)
Hemoglobin: 8.7 g/dL — ABNORMAL LOW (ref 13.0–17.0)

## 2020-12-22 LAB — MAGNESIUM: Magnesium: 2 mg/dL (ref 1.7–2.4)

## 2020-12-22 MED ORDER — SODIUM CHLORIDE 0.9 % IV BOLUS
500.0000 mL | Freq: Once | INTRAVENOUS | Status: AC
  Administered 2020-12-22: 500 mL via INTRAVENOUS

## 2020-12-22 MED ORDER — CARVEDILOL 3.125 MG PO TABS
3.1250 mg | ORAL_TABLET | Freq: Two times a day (BID) | ORAL | Status: DC
  Administered 2020-12-23 – 2020-12-24 (×3): 3.125 mg via ORAL
  Filled 2020-12-22 (×3): qty 1

## 2020-12-22 MED ORDER — THIAMINE HCL 100 MG PO TABS
100.0000 mg | ORAL_TABLET | Freq: Every day | ORAL | Status: DC
  Administered 2020-12-23 – 2020-12-24 (×2): 100 mg via ORAL
  Filled 2020-12-22 (×2): qty 1

## 2020-12-22 NOTE — Care Management Important Message (Signed)
Important Message  Patient Details  Name: Fernando [name] MRN: [id number] Date of Birth: 1955/09/15   Medicare Important Message Given:  Yes     Dannette Barbara 12/22/2020, 4:50 PM

## 2020-12-22 NOTE — NC FL2 (Signed)
[place] LEVEL OF CARE SCREENING TOOL     IDENTIFICATION  Patient Name: Fernando [name] Birthdate: 11-Jul-1955 Sex: male Admission Date (Current Location): 12/18/2020  [facility] and [place] Number:  Engineering geologist and Address:  [facility], [address] [place], [place] [address]      Provider Number: [id number]  Attending Physician Name and Address:  Bonnielee Haff, MD  Relative Name and Phone Number:       Current Level of Care: Hospital Recommended Level of Care: [place] Prior Approval Number:    Date Approved/Denied:   PASRR Number: [id number]  Discharge Plan: SNF    Current Diagnoses: Patient Active Problem List   Diagnosis Date Noted   Malnutrition of moderate degree 18/56/3149   Alcoholic ketoacidosis 70/26/3785   Chronic diastolic CHF (congestive heart failure) ([place]) 88/50/2774   Alcoholic cirrhosis of liver without ascites ([place])    Acute GI bleeding 08/16/2020   Abnormal CT of the abdomen 05/16/2020   Pancreatic disease 05/16/2020   Cyst of pancreas 05/16/2020   Acute on chronic combined systolic and diastolic CHF (congestive heart failure) ([place]) 02/05/2020   Anemia of chronic disease    Alcoholic hepatitis without ascites    Portal hypertensive gastropathy ([place])    Pancreatic mass    Sinus tachycardia    AKI (acute kidney injury) ([place]) 01/27/2020   Fall at home, initial encounter 01/26/2020   Hypotension 01/26/2020   Tobacco dependence 01/26/2020   Dislocation of prosthetic shoulder joint, initial encounter ([place])    Essential hypertension    Dyslipidemia    Alcohol dependence ([place])    Pneumonitis    Generalized weakness 12/87/8676   Alcoholic hepatitis 72/12/4707   Sinus tachycardia 10/25/2019   Hyperammonemia ([place]) 10/25/2019   Alcohol withdrawal ([place]) 10/25/2019   Acute hypoxemic respiratory failure ([place]) 10/25/2019   Macrocytic anemia 10/25/2019   Thrombocytopenia ([place])  10/25/2019   Hypertension    Gout    Tobacco abuse    Stasis dermatitis of both legs 09/04/2018   Medication monitoring encounter 08/09/2018   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/12/2018   Rotator cuff tear arthropathy, right 06/21/2018    Orientation RESPIRATION BLADDER Height & Weight     Self, Time, Situation, Place  O2 ([place] 2L) Incontinent Weight: 135 lb 1.6 oz (61.3 kg) Height:  5\' 5"  (165.1 cm)  BEHAVIORAL SYMPTOMS/MOOD NEUROLOGICAL BOWEL NUTRITION STATUS      Continent Diet (DYS 3 diet, thin liquids)  AMBULATORY STATUS COMMUNICATION OF NEEDS Skin   Limited Assist Verbally Other (Comment) (open wound on left hand)                       Personal Care Assistance Level of Assistance  Dressing, Feeding, Bathing Bathing Assistance: Limited assistance Feeding assistance: Independent Dressing Assistance: Limited assistance     Functional Limitations Info  Hearing, Sight, Speech Sight Info: Adequate Hearing Info: Adequate Speech Info: Adequate    SPECIAL CARE FACTORS FREQUENCY  PT (By licensed PT), OT (By licensed OT)     PT Frequency: 5x OT Frequency: 5x            Contractures Contractures Info: Not present    Additional Factors Info  Code Status, Allergies Code Status Info: Full Code Allergies Info: Atorvastatin, Doxycycline           Current Medications (12/22/2020):  This is the current hospital active medication list Current Facility-Administered Medications  Medication Dose Route Frequency  Provider Last Rate Last Admin   0.9 %  sodium chloride infusion (Manually program via Guardrails IV Fluids)   Intravenous Once Sharion Settler, NP       acetaminophen (TYLENOL) tablet 650 mg  650 mg Oral Q6H PRN Agbata, Tochukwu, MD   650 mg at 12/21/20 1310   Or   acetaminophen (TYLENOL) suppository 650 mg  650 mg Rectal Q6H PRN Agbata, Tochukwu, MD       albuterol (PROVENTIL) (2.5 MG/3ML) 0.083% nebulizer solution 2.5 mg  2.5 mg Nebulization Q6H  PRN Agbata, Tochukwu, MD       allopurinol (ZYLOPRIM) tablet 100 mg  100 mg Oral Daily Agbata, Tochukwu, MD   100 mg at 12/22/20 0958   amoxicillin-clavulanate (AUGMENTIN) 875-125 MG per tablet 1 tablet  1 tablet Oral Q12H Bonnielee Haff, MD   1 tablet at 12/22/20 8676   ascorbic acid (VITAMIN C) tablet 500 mg  500 mg Oral BID Bonnielee Haff, MD   500 mg at 12/22/20 0959   [START ON 12/23/2020] carvedilol (COREG) tablet 3.125 mg  3.125 mg Oral BID Bonnielee Haff, MD       docusate sodium (COLACE) capsule 100 mg  100 mg Oral BID PRN Agbata, Tochukwu, MD       feeding supplement (BOOST / RESOURCE BREEZE) liquid 1 Container  1 Container Oral TID BM Bonnielee Haff, MD   1 Container at 12/21/20 2012   LORazepam (ATIVAN) injection 0-4 mg  0-4 mg Intravenous Q6H Bonnielee Haff, MD   2 mg at 12/21/20 1857   Followed by   LORazepam (ATIVAN) injection 0-4 mg  0-4 mg Intravenous Q12H Bonnielee Haff, MD       LORazepam (ATIVAN) tablet 1-4 mg  1-4 mg Oral Q1H PRN Bonnielee Haff, MD       Or   LORazepam (ATIVAN) injection 1-4 mg  1-4 mg Intravenous Q1H PRN Bonnielee Haff, MD       multivitamin with minerals tablet 1 tablet  1 tablet Oral Daily Lorna Dibble, RPH   1 tablet at 12/22/20 1950   nicotine (NICODERM CQ - dosed in mg/24 hours) patch 14 mg  14 mg Transdermal Daily PRN Agbata, Tochukwu, MD   14 mg at 12/20/20 1326   ondansetron (ZOFRAN) tablet 4 mg  4 mg Oral Q6H PRN Agbata, Tochukwu, MD       Or   ondansetron (ZOFRAN) injection 4 mg  4 mg Intravenous Q6H PRN Agbata, Tochukwu, MD       pantoprazole (PROTONIX) injection 40 mg  40 mg Intravenous Q12H Bonnielee Haff, MD   40 mg at 12/22/20 0958   thiamine (B-1) injection 100 mg  100 mg Intravenous Q24H Bonnielee Haff, MD   100 mg at 12/22/20 9326     Discharge Medications: Please see discharge summary for a list of discharge medications.  Relevant Imaging Results:  Relevant Lab Results:   Additional Information SSN:  [id number]  Eileen Stanford, LCSW

## 2020-12-22 NOTE — Progress Notes (Signed)
Physical Therapy Treatment Patient Details Name: Fernando [name] MRN: [id number] DOB: Mar 13, 1956 Today's Date: 12/22/2020   History of Present Illness Pt is a 65 y.o. male presenting to hospital 9/15 with progressive generalized weakness (s/p fall).  Pt admitted with alcoholic ketoacidosis, metabolic acidosis, and generalized weakness.  PMH includes alcohol dependence, chronic systolic CHF, htn, PNA, acute GI bleeding, pancreatic disease, sinus tachycardia, acute hypoxemic respiratory failure, R reverse shoulder arthroplasty 2020, knee surgery L.    PT Comments    Pt resting in bed upon PT arrival; agreeable to PT session.  Min assist semi-supine to sitting edge of bed; mod assist to stand from bed x1 trial but min assist to stand from recliner x3 trials with RW use (pt initially with posterior lean standing requiring assist for balance but improved balance noted with cueing and repetition/practice); and min assist to ambulate a few feet bed to recliner with RW (assist for balance).  Pt's HR 94 bpm at rest beginning of session; HR increased to 127 bpm with activity; and HR decreased to 105 bpm at rest end of session.  Pt's BP also noted to be 93/59 sitting in recliner (pt asymptomatic)--nurse notified regarding pt's BP/vitals.  Overall pt demonstrating generalized weakness, impaired balance, and requiring assist for functional mobility: continue to recommend SNF.    Recommendations for follow up therapy are one component of a multi-disciplinary discharge planning process, led by the attending physician.  Recommendations may be updated based on patient status, additional functional criteria and insurance authorization.  Follow Up Recommendations  SNF     Equipment Recommendations  Rolling walker with 5" wheels    Recommendations for Other Services OT consult     Precautions / Restrictions Precautions Precautions: Fall Restrictions Weight Bearing Restrictions: No     Mobility  Bed  Mobility Overal bed mobility: Needs Assistance Bed Mobility: Supine to Sit     Supine to sit: Min assist;HOB elevated     General bed mobility comments: assist for trunk semi-supine to sitting    Transfers Overall transfer level: Needs assistance Equipment used: Rolling walker (2 wheeled) Transfers: Sit to/from Stand Sit to Stand: Mod assist;Min assist         General transfer comment: mod assist to stand from bed x1 trial; min assist to stand from recliner x3 trials; vc's for UE/LE placement and overall technique required  Ambulation/Gait Ambulation/Gait assistance: Min assist Gait Distance (Feet): 3 Feet Assistive device: Rolling walker (2 wheeled)   Gait velocity: decreased   General Gait Details: assist to steady; vc's for use of walker and for taking steps; bed to recliner   Stairs             Wheelchair Mobility    Modified Rankin (Stroke Patients Only)       Balance Overall balance assessment: Needs assistance Sitting-balance support: No upper extremity supported;Feet supported Sitting balance-Leahy Scale: Good Sitting balance - Comments: steady sitting reaching within BOS     Standing balance-Leahy Scale: Poor Standing balance comment: Min assist progressing to CGA for static standing balance with B UE support on RW (d/t initial posterior lean)                            Cognition Arousal/Alertness: Awake/alert Behavior During Therapy: WFL for tasks assessed/performed Overall Cognitive Status: Within Functional Limits for tasks assessed  Exercises      General Comments  Nursing cleared pt for participation in physical therapy.  Pt agreeable to PT session.      Pertinent Vitals/Pain Pain Assessment: No/denies pain    Home Living                      Prior Function            PT Goals (current goals can now be found in the care plan section) Acute Rehab PT  Goals Patient Stated Goal: to improve strength and balance PT Goal Formulation: With patient Time For Goal Achievement: 01/02/21 Potential to Achieve Goals: Good Progress towards PT goals: Progressing toward goals    Frequency    Min 2X/week      PT Plan Current plan remains appropriate    Co-evaluation              AM-PAC PT "6 Clicks" Mobility   Outcome Measure  Help needed turning from your back to your side while in a flat bed without using bedrails?: None Help needed moving from lying on your back to sitting on the side of a flat bed without using bedrails?: A Little Help needed moving to and from a bed to a chair (including a wheelchair)?: A Little Help needed standing up from a chair using your arms (e.g., wheelchair or bedside chair)?: A Lot Help needed to walk in hospital room?: A Lot Help needed climbing 3-5 steps with a railing? : Total 6 Click Score: 15    End of Session Equipment Utilized During Treatment: Gait belt;Oxygen (2 L via nasal cannula) Activity Tolerance: Patient limited by fatigue Patient left: in chair;with call bell/phone within reach;with chair alarm set Nurse Communication: Mobility status;Precautions;Other (comment) (pt's BP and HR during session) PT Visit Diagnosis: Other abnormalities of gait and mobility (R26.89);Muscle weakness (generalized) (M62.81);History of falling (Z91.81)     Time: [id number] PT Time Calculation (min) (ACUTE ONLY): 28 min  Charges:  $Therapeutic Activity: 23-37 mins                     Leitha Bleak, PT 12/22/20, 11:56 AM

## 2020-12-22 NOTE — Progress Notes (Signed)
Lucilla Lame, MD [facility]   [address], [place], [place] [address] Phone: [phone] Fax : [phone]   Subjective: This patient was admitted with out on withdrawal of melena.  The patient had an EGD that showed a clean-based gastric ulcer.  The patient reports that he has continued to drink up until the time of admission. The patient's hemoglobin this morning was stable.  He reports that he had a bowel movement today but does not know the color of it.  He denies any abdominal pain.   Objective: Vital signs in last 24 hours: Vitals:   12/22/20 0715 12/22/20 1135 12/22/20 1522 12/22/20 1934  BP: 114/64 (!) 93/59 101/60 105/70  Pulse: 85 (!) 105 91 90  Resp: '17  17 18  '$ Temp: 97.7 F (36.5 C)  98.7 F (37.1 C) 98.6 F (37 C)  TempSrc:    Oral  SpO2: 100% 96% 95% 100%  Weight:      Height:       Weight change:   Intake/Output Summary (Last 24 hours) at 12/22/2020 1952 Last data filed at 12/22/2020 1757 Gross per 24 hour  Intake 720 ml  Output 1800 ml  Net -1080 ml     Exam: Heart:: Regular rate and rhythm, S1S2 present, or without murmur or extra heart sounds Lungs: normal and clear to auscultation and percussion Abdomen: soft, nontender, normal bowel sounds   Lab Results: '@LABTEST2'$ @ Micro Results: Recent Results (from the past 240 hour(s))  Blood culture (single)     Status: None (Preliminary result)   Collection Time: 12/18/20  9:20 AM   Specimen: BLOOD  Result Value Ref Range Status   Specimen Description BLOOD  Final   Special Requests   Final    BAA Blood Culture results may not be optimal due to an excessive volume of blood received in culture bottles   Culture   Final    NO GROWTH 4 DAYS Performed at [facility], [address], [place], [place] [address]    Report Status PENDING  Incomplete  Resp Panel by RT-PCR (Flu A&B, Covid) Nasopharyngeal Swab     Status: None   Collection Time: 12/18/20  9:22 AM   Specimen: Nasopharyngeal  Swab; Nasopharyngeal(NP) swabs in vial transport medium  Result Value Ref Range Status   SARS Coronavirus 2 by RT PCR NEGATIVE NEGATIVE Final    Comment: (NOTE) SARS-CoV-2 target nucleic acids are NOT DETECTED.  The SARS-CoV-2 RNA is generally detectable in upper respiratory specimens during the acute phase of infection. The lowest concentration of SARS-CoV-2 viral copies this assay can detect is 138 copies/mL. A negative result does not preclude SARS-Cov-2 infection and should not be used as the sole basis for treatment or other patient management decisions. A negative result may occur with  improper specimen collection/handling, submission of specimen other than nasopharyngeal swab, presence of viral mutation(s) within the areas targeted by this assay, and inadequate number of viral copies(<138 copies/mL). A negative result must be combined with clinical observations, patient history, and epidemiological information. The expected result is Negative.  Fact Sheet for Patients:  EntrepreneurPulse.com.au  Fact Sheet for Healthcare Providers:  IncredibleEmployment.be  This test is no t yet approved or cleared by the [place] FDA and  has been authorized for detection and/or diagnosis of SARS-CoV-2 by FDA under an Emergency Use Authorization (EUA). This EUA will remain  in effect (meaning this test can be used) for the duration of the COVID-19 declaration under Section 564(b)(1) of the  Act, 21 U.S.C.section 360bbb-3(b)(1), unless the authorization is terminated  or revoked sooner.       Influenza A by PCR NEGATIVE NEGATIVE Final   Influenza B by PCR NEGATIVE NEGATIVE Final    Comment: (NOTE) The Xpert Xpress SARS-CoV-2/FLU/RSV plus assay is intended as an aid in the diagnosis of influenza from Nasopharyngeal swab specimens and should not be used as a sole basis for treatment. Nasal washings and aspirates are unacceptable for Xpert Xpress  SARS-CoV-2/FLU/RSV testing.  Fact Sheet for Patients: EntrepreneurPulse.com.au  Fact Sheet for Healthcare Providers: IncredibleEmployment.be  This test is not yet approved or cleared by the [place] FDA and has been authorized for detection and/or diagnosis of SARS-CoV-2 by FDA under an Emergency Use Authorization (EUA). This EUA will remain in effect (meaning this test can be used) for the duration of the COVID-19 declaration under Section 564(b)(1) of the Act, 21 U.S.C. section 360bbb-3(b)(1), unless the authorization is terminated or revoked.  Performed at [facility], [address] [place], [place] [address]   Culture, blood (single) w Reflex to ID Panel     Status: None (Preliminary result)   Collection Time: 12/19/20  1:57 AM   Specimen: BLOOD  Result Value Ref Range Status   Specimen Description BLOOD RIGHT HAND  Final   Special Requests   Final    BOTTLES DRAWN AEROBIC AND ANAEROBIC Blood Culture results may not be optimal due to an excessive volume of blood received in culture bottles   Culture   Final    NO GROWTH 3 DAYS Performed at [facility], [address] [place], [place] [address]    Report Status PENDING  Incomplete   Studies/Results: DG Chest Port 1 View  Result Date: 12/21/2020 CLINICAL DATA:  Wheezing. Alcoholic ketoacidosis. Metabolic acidosis. EXAM: PORTABLE CHEST 1 VIEW COMPARISON:  None. FINDINGS: No pneumothorax. The cardiomediastinal silhouette is stable.a bibasilar opacities are identified, left greater than right. No other acute abnormalities. IMPRESSION: New bibasilar opacities, left greater than right, concerning for pneumonia in the appropriate clinical setting. Atelectasis is possible. Recommend clinical correlation. Electronically Signed   By: Dorise Bullion III M.D.   On: 12/21/2020 12:08   Medications: I have reviewed the patient's current medications. Scheduled Meds:  sodium  chloride   Intravenous Once   allopurinol  100 mg Oral Daily   amoxicillin-clavulanate  1 tablet Oral Q12H   vitamin C  500 mg Oral BID   [START ON 12/23/2020] carvedilol  3.125 mg Oral BID   feeding supplement  1 Container Oral TID BM   multivitamin with minerals  1 tablet Oral Daily   pantoprazole  40 mg Intravenous Q12H   [START ON 12/23/2020] thiamine  100 mg Oral Daily   Continuous Infusions: PRN Meds:.acetaminophen **OR** acetaminophen, albuterol, docusate sodium, LORazepam **OR** LORazepam, nicotine, ondansetron **OR** ondansetron (ZOFRAN) IV   Assessment: Principal Problem:   Alcoholic ketoacidosis Active Problems:   Essential hypertension   Alcohol dependence (HCC)   Alcoholic cirrhosis of liver without ascites (HCC)   Chronic diastolic CHF (congestive heart failure) (HCC)   Malnutrition of moderate degree    Plan: This patient comes in without call liver disease and melena with a clean base ulcer.  The patient has had no further sign of any GI bleeding.  The patient should follow-up with Dr. Bonna Gains after discharge.  Nothing further to do from a GI point of view unless he has any further GI bleeding.   LOS: 4 days   Lucilla Lame,  FACG 12/22/2020, 7:52 PM Pager [phone] 7am-5pm  Check AMION for 5pm -7am coverage and on weekends

## 2020-12-22 NOTE — Progress Notes (Signed)
Occupational Therapy Treatment Patient Details Name: Fernando [name] MRN: [id number] DOB: 1955-08-26 Today's Date: 12/22/2020   History of present illness Pt is a 65 y.o. male presenting to hospital 9/15 with progressive generalized weakness (s/p fall).  Pt admitted with alcoholic ketoacidosis, metabolic acidosis, and generalized weakness.  PMH includes alcohol dependence, chronic systolic CHF, htn, PNA, acute GI bleeding, pancreatic disease, sinus tachycardia, acute hypoxemic respiratory failure, R reverse shoulder arthroplasty 2020, knee surgery L.   OT comments  Upon entering the room, pt supine in bed and very motivated for OT intervention. Pt reports he wants to get out of bed and keep working on "getting stronger to get out of here". Pt standing with mod A from EOB and standing at sink with set up A to open containers and min - mod A for standing balance to brush teeth. Pt ambulating 20' with min - mod hand held assist 30' and then returning back to bed. Pt reports fatigue and returns to supine with all needs within reach. Pt making great progress towards goals. Pt does not report any dizziness during session and BP of 117/80 after functional ambulation and standing tasks.    Recommendations for follow up therapy are one component of a multi-disciplinary discharge planning process, led by the attending physician.  Recommendations may be updated based on patient status, additional functional criteria and insurance authorization.    Follow Up Recommendations  SNF;Supervision/Assistance - 24 hour    Equipment Recommendations  Other (comment) (defer to next venue of care)       Precautions / Restrictions Precautions Precautions: Fall Precaution Comments: NPO Restrictions Weight Bearing Restrictions: No       Mobility Bed Mobility Overal bed mobility: Needs Assistance Bed Mobility: Supine to Sit;Sit to Supine     Supine to sit: Min guard Sit to supine: Min guard   General bed  mobility comments: cuing for safety    Transfers Overall transfer level: Needs assistance Equipment used: 1 person hand held assist Transfers: Sit to/from [facility] Sit to Stand: Mod assist;Min assist Stand pivot transfers: Mod assist            Balance Overall balance assessment: Needs assistance Sitting-balance support: No upper extremity supported;Feet supported Sitting balance-Leahy Scale: Good Sitting balance - Comments: steady sitting reaching within BOS   Standing balance support: During functional activity Standing balance-Leahy Scale: Poor Standing balance comment: min - mod A for balance                           ADL either performed or assessed with clinical judgement   ADL Overall ADL's : Needs assistance/impaired     Grooming: Minimal assistance;Standing                                       Vision Patient Visual Report: No change from baseline            Cognition Arousal/Alertness: Awake/alert Behavior During Therapy: WFL for tasks assessed/performed Overall Cognitive Status: Within Functional Limits for tasks assessed                                 General Comments: Pt with increased insight to deficits and safety awareness this session.  Pertinent Vitals/ Pain       Pain Assessment: No/denies pain   Frequency  Min 2X/week        Progress Toward Goals  OT Goals(current goals can now be found in the care plan section)  Progress towards OT goals: Progressing toward goals  Acute Rehab OT Goals Patient Stated Goal: to improve strength and balance OT Goal Formulation: Patient unable to participate in goal setting Time For Goal Achievement: 01/03/21 Potential to Achieve Goals: [place] Discharge plan remains appropriate;Frequency remains appropriate       AM-PAC OT "6 Clicks" Daily Activity     Outcome Measure   Help from another person eating  meals?: A Little Help from another person taking care of personal grooming?: A Little Help from another person toileting, which includes using toliet, bedpan, or urinal?: A Lot Help from another person bathing (including washing, rinsing, drying)?: A Lot Help from another person to put on and taking off regular upper body clothing?: A Little Help from another person to put on and taking off regular lower body clothing?: A Lot 6 Click Score: 15    End of Session    OT Visit Diagnosis: Unsteadiness on feet (R26.81);Muscle weakness (generalized) (M62.81)   Activity Tolerance Patient tolerated treatment well   Patient Left in bed;with call bell/phone within reach;with bed alarm set   Nurse Communication Mobility status        Time: [id number] OT Time Calculation (min): 23 min  Charges: OT General Charges $OT Visit: 1 Visit OT Treatments $Self Care/Home Management : 8-22 mins $Therapeutic Activity: 8-22 mins  Darleen Crocker, MS, OTR/L , CBIS ascom [phone]  12/22/20, 4:04 PM

## 2020-12-22 NOTE — Progress Notes (Signed)
SLP Cancellation Note  Patient Details Name: Fernando [name] MRN: [id number] DOB: 1955/06/04   Cancelled treatment:       Reason Eval/Treat Not Completed: SLP screened, no needs identified, will sign off (chart reviewed; consulted NSG then met w/ pt in room). Pt denied any difficulty swallowing and is currently on a regular diet; tolerates swallowing pills w/ water per NSG. Pt conversed in general conversation w/out expressive/receptive deficits noted; pt denied any swallowing issues "now". When asked what the swallowing issues were "before", he stated he couldn't eat b/c his stomach hurt. Suspect some impact from the current GI issues(GI following) and the ETOH use/abuse. Noted Upper Endoscopy note from 12/20/2020: "Grade B reflux esophagitis with no bleeding. Non-bleeding gastric ulcers.". Protonix was recommended by GI.  Due to pt's performance on recent MBSS in 08/2020, and current CXR Imaging, recommend f/u w/ Objective swallow assessment - MBSS next 1-2 days during this admit. Suspect pt's ETOH abuse w/ REFLUX and (suspected) Cognitive decline (as noted on recent MBSS) could impact oropharyngeal phase swallowing and his risk for aspiration.  ST services will f/u w/ pt's status next 1-2 days. Pt agreed. NSG and MD updated.        Orinda Kenner, MS, CCC-SLP Speech Language Pathologist Rehab Services [phone] [facility] 12/22/2020, 2:14 PM

## 2020-12-22 NOTE — Progress Notes (Signed)
[facility] PROGRESS NOTE   Fernando [name] GYB:[id number] DOB: [birth year] DOA: 12/18/2020  PCP: [name], MD  Brief History/Interval Summary: 65 y.o. male with medical history significant for chronic diastolic CHF (EF 87-68% in 08/2020);  ETOH dependence with history of DTs, history of prosthetic shoulder joint infection; HTN; and HLD who presents to the ER by EMS for evaluation of weakness.  Patient apparently sustained a fall at home as well.  In the emergency department he was noted to have symptoms and signs of alcohol withdrawal.  Electrolyte abnormalities noted on blood work.  Patient was hospitalized for further management.  Subsequently had a drop in hemoglobin.  Black stools were noted.  Gastroenterology was consulted.   Consultants: Gastroenterology  Procedures:  EGD 9/17 Impression:            - LA Grade B reflux esophagitis with no bleeding.                        - Non-bleeding gastric ulcers with a clean ulcer base                         (Forrest Class III).                        - Erythematous duodenopathy.                        - No specimens collected.  Antibiotics: Anti-infectives (From admission, onward)    Start     Dose/Rate Route Frequency Ordered Stop   12/21/20 1430  amoxicillin-clavulanate (AUGMENTIN) 875-125 MG per tablet 1 tablet        1 tablet Oral Every 12 hours 12/21/20 1331     12/18/20 0915  cefTRIAXone (ROCEPHIN) 2 g in sodium chloride 0.9 % 100 mL IVPB        2 g 200 mL/hr over 30 Minutes Intravenous  Once 12/18/20 0906 12/18/20 1110   12/18/20 0915  azithromycin (ZITHROMAX) 500 mg in sodium chloride 0.9 % 250 mL IVPB        500 mg 250 mL/hr over 60 Minutes Intravenous  Once 12/18/20 0906 12/18/20 1210       Subjective/Interval History: Patient noted to be more awake alert today.  Denies any complaints.  Continues to have a cough however.  No shortness of breath.  No chest pain.  No nausea or vomiting.      Assessment/Plan:  Alcohol withdrawal syndrome/alcohol abuse Patient remains on CIWA protocol.  Heart rate has improved.  Mentation is improved.  Continue thiamine multivitamins.  Continue CIWA protocol.  Social worker to provide resources for alcohol rehab.  Upper GI bleed/acute blood loss anemia Noted to have black stools by nursing staff.  Occult blood testing is positive.  Drop in hemoglobin noted although some of it was probably dilutional.  Transfused 1 unit of PRBC.  Hemoglobin responded and has been stable.   Previous records reviewed.  Looks like patient had an upper endoscopy in May 2022 by Dr. [name] in [place].  Showed benign nonobstructing upper esophageal stenosis.  Clean-based nonbleeding gastric ulcers were noted.  Portal hypertensive gastropathy was seen.  Duodenitis and nonbleeding duodenal ulcers were noted. Gastroenterology was subsequently consulted.  Placed on octreotide infusion.  PPI twice daily.  No ascites noted on CT abdomen and pelvis.  INR was 1.0. Patient underwent EGD on 9/17 which showed  esophagitis.  Nonbleeding gastric ulcers were also noted.  Erythematous duodenopathy was noted.  No varices were noted.  Octreotide discontinued.  Continue PPI.  Hemoglobin stable for the most part.  Aspiration pneumonia Noted to have cough yesterday.  Was on oxygen.  X-ray suggested bibasilar opacities raising concern for pneumonia.  He could have aspirated and was going through withdrawal.  Placed on Augmentin.  He was also given a dose of IV furosemide yesterday.  However at this time we will not repeat.  IV fluids have been discontinued.  Speech therapy to see.  Alcoholic ketoacidosis Seems to have improved with IV hydration.  Hyponatremia/hypokalemia/hypomagnesemia Sodium level is low but stable.  Likely due to liver cirrhosis.   Potassium is better.  Magnesium is also improved.  Alcoholic liver cirrhosis No ascites noted on examination or on CT scan.  AST and ALT  elevated likely due to his alcoholism.  Will need outpatient follow-up for cirrhosis.  Counseling provided regarding alcohol use.  Hepatitis panel was unremarkable in July 2021.  Thrombocytopenia Significant drop in platelet counts noted.  Likely due to bone marrow suppression from alcohol as well as history of liver cirrhosis.  No overt bleeding noted except as mentioned above.   Platelet counts are low but stable.  Not on any heparin products.  History of chronic diastolic CHF Some concern for fluid overload yesterday for which he was given a dose of Lasix.  Seems to be fairly euvolemic currently.  IV fluids were discontinued.  Remains on carvedilol.    Pancreatic cyst Outpatient monitoring.  Physical deconditioning Seen by PT and OT.  Skilled nursing facility is recommended for rehab.  DVT Prophylaxis: SCDs Code Status: Full code Family Communication: Spoke to his brother 2 days ago.  We will update him today. Disposition Plan: SNF is recommended.  Status is: Inpatient  Remains inpatient appropriate because:Ongoing diagnostic testing needed not appropriate for outpatient work up and IV treatments appropriate due to intensity of illness or inability to take PO  Dispo: The patient is from: Home              Anticipated d/c is to: Home              Patient currently is not medically stable to d/c.   Difficult to place patient No     Medications: Scheduled:  sodium chloride   Intravenous Once   allopurinol  100 mg Oral Daily   amoxicillin-clavulanate  1 tablet Oral Q12H   vitamin C  500 mg Oral BID   [START ON 12/23/2020] carvedilol  3.125 mg Oral BID   feeding supplement  1 Container Oral TID BM   LORazepam  0-4 mg Intravenous Q6H   Followed by   LORazepam  0-4 mg Intravenous Q12H   multivitamin with minerals  1 tablet Oral Daily   pantoprazole  40 mg Intravenous Q12H   thiamine injection  100 mg Intravenous Q24H   Continuous:   QDI:[id number] **OR** acetaminophen,  albuterol, docusate sodium, LORazepam **OR** LORazepam, nicotine, ondansetron **OR** ondansetron (ZOFRAN) IV   Objective:  Vital Signs  Vitals:   12/22/20 0012 12/22/20 0225 12/22/20 0422 12/22/20 0715  BP: (!) 88/53 96/62 97/69  114/64  Pulse: 91 86 86 85  Resp:  20 16 17   Temp:  98.4 F (36.9 C) 97.8 F (36.6 C) 97.7 F (36.5 C)  TempSrc:  Oral Oral   SpO2: 96% 100% 94% 100%  Weight:      Height:  Intake/Output Summary (Last 24 hours) at 12/22/2020 1043 Last data filed at 12/22/2020 0940 Gross per 24 hour  Intake 938.32 ml  Output 3400 ml  Net -2461.68 ml    Filed Weights   12/18/20 0251 12/18/20 1854 12/19/20 1540  Weight: 74.8 kg 65.5 kg 61.3 kg    General appearance: Awake alert.  In no distress Resp: Normal effort at rest.  Coarse breath sound bilaterally with crackles bilateral bases.  No wheezing or rhonchi. Cardio: S1-S2 is normal regular.  No S3-S4.  No rubs murmurs or bruit GI: Abdomen is soft.  Nontender nondistended.  Bowel sounds are present normal.  No masses organomegaly Extremities: No edema.  Moving all of his extremities Neurologic: Alert and oriented x3.  No focal neurological deficits.    Lab Results:  Data Reviewed: I have personally reviewed following labs and imaging studies  CBC: Recent Labs  Lab 12/18/20 0307 12/19/20 0157 12/19/20 1242 12/20/20 0531 12/21/20 0545 12/22/20 0118 12/22/20 0600  WBC 9.4 4.5  --  5.6 4.0  --  4.4  NEUTROABS 8.0*  --   --   --   --   --   --   HGB 11.1* 7.6* 10.3* 10.5* 9.3* 8.7* 9.0*  HCT 32.4* 20.9* 28.5* 28.3* 25.7* 24.9* 25.7*  MCV 108.7* 103.5*  --  97.3 98.5  --  99.6  PLT 103* 44*  --  48* 44*  --  60*     Basic Metabolic Panel: Recent Labs  Lab 12/18/20 0920 12/19/20 0157 12/19/20 1242 12/20/20 0531 12/21/20 0545 12/22/20 0600  NA  --  128* 130* 130* 130* 129*  K  --  2.8* 3.3* 3.7 3.2* 4.0  CL  --  87* 88* 89* 91* 95*  CO2  --  28 26 31 31 30   GLUCOSE  --  129* 87 145*  117* 113*  BUN  --  20 16 11 8 14   CREATININE  --  0.74 0.70 0.56* 0.64 0.66  CALCIUM  --  8.5* 8.7* 8.4* 8.3* 8.2*  MG 1.7 1.7  --   --  1.6* 2.0     GFR: Estimated Creatinine Clearance: 77.7 mL/min (by C-G formula based on SCr of 0.66 mg/dL).  Liver Function Tests: Recent Labs  Lab 12/18/20 0307 12/20/20 0531 12/21/20 0545 12/22/20 0600  AST 105* 68* 132* 171*  ALT 68* 38 57* 92*  ALKPHOS 163* 116 113 127*  BILITOT 3.0* 2.3* 1.9* 1.4*  PROT 7.4 6.2* 5.6* 5.6*  ALBUMIN 4.0 3.5 3.0* 3.0*     Recent Labs  Lab 12/18/20 0920  LIPASE 133*    Recent Labs  Lab 12/19/20 0157  AMMONIA 35     Coagulation Profile: Recent Labs  Lab 12/18/20 0920 12/19/20 0403  INR 1.1 1.0     Cardiac Enzymes: Recent Labs  Lab 12/18/20 0920  CKTOTAL 123       Recent Results (from the past 240 hour(s))  Blood culture (single)     Status: None (Preliminary result)   Collection Time: 12/18/20  9:20 AM   Specimen: BLOOD  Result Value Ref Range Status   Specimen Description BLOOD  Final   Special Requests   Final    BAA Blood Culture results may not be optimal due to an excessive volume of blood received in culture bottles   Culture   Final    NO GROWTH 4 DAYS Performed at [facility], [address], [place], [place] [address]    Report Status  PENDING  Incomplete  Resp Panel by RT-PCR (Flu A&B, Covid) Nasopharyngeal Swab     Status: None   Collection Time: 12/18/20  9:22 AM   Specimen: Nasopharyngeal Swab; Nasopharyngeal(NP) swabs in vial transport medium  Result Value Ref Range Status   SARS Coronavirus 2 by RT PCR NEGATIVE NEGATIVE Final    Comment: (NOTE) SARS-CoV-2 target nucleic acids are NOT DETECTED.  The SARS-CoV-2 RNA is generally detectable in upper respiratory specimens during the acute phase of infection. The lowest concentration of SARS-CoV-2 viral copies this assay can detect is 138 copies/mL. A negative result does not preclude  SARS-Cov-2 infection and should not be used as the sole basis for treatment or other patient management decisions. A negative result may occur with  improper specimen collection/handling, submission of specimen other than nasopharyngeal swab, presence of viral mutation(s) within the areas targeted by this assay, and inadequate number of viral copies(<138 copies/mL). A negative result must be combined with clinical observations, patient history, and epidemiological information. The expected result is Negative.  Fact Sheet for Patients:  EntrepreneurPulse.com.au  Fact Sheet for Healthcare Providers:  IncredibleEmployment.be  This test is no t yet approved or cleared by the [place] FDA and  has been authorized for detection and/or diagnosis of SARS-CoV-2 by FDA under an Emergency Use Authorization (EUA). This EUA will remain  in effect (meaning this test can be used) for the duration of the COVID-19 declaration under Section 564(b)(1) of the Act, 21 U.S.C.section 360bbb-3(b)(1), unless the authorization is terminated  or revoked sooner.       Influenza A by PCR NEGATIVE NEGATIVE Final   Influenza B by PCR NEGATIVE NEGATIVE Final    Comment: (NOTE) The Xpert Xpress SARS-CoV-2/FLU/RSV plus assay is intended as an aid in the diagnosis of influenza from Nasopharyngeal swab specimens and should not be used as a sole basis for treatment. Nasal washings and aspirates are unacceptable for Xpert Xpress SARS-CoV-2/FLU/RSV testing.  Fact Sheet for Patients: EntrepreneurPulse.com.au  Fact Sheet for Healthcare Providers: IncredibleEmployment.be  This test is not yet approved or cleared by the [place] FDA and has been authorized for detection and/or diagnosis of SARS-CoV-2 by FDA under an Emergency Use Authorization (EUA). This EUA will remain in effect (meaning this test can be used) for the duration of  the COVID-19 declaration under Section 564(b)(1) of the Act, 21 U.S.C. section 360bbb-3(b)(1), unless the authorization is terminated or revoked.  Performed at [facility], [address] [place], [place] [address]   Culture, blood (single) w Reflex to ID Panel     Status: None (Preliminary result)   Collection Time: 12/19/20  1:57 AM   Specimen: BLOOD  Result Value Ref Range Status   Specimen Description BLOOD RIGHT HAND  Final   Special Requests   Final    BOTTLES DRAWN AEROBIC AND ANAEROBIC Blood Culture results may not be optimal due to an excessive volume of blood received in culture bottles   Culture   Final    NO GROWTH 3 DAYS Performed at [facility], [address] [place],  [address]    Report Status PENDING  Incomplete       Radiology Studies: DG Chest Port 1 View  Result Date: 12/21/2020 CLINICAL DATA:  Wheezing. Alcoholic ketoacidosis. Metabolic acidosis. EXAM: PORTABLE CHEST 1 VIEW COMPARISON:  None. FINDINGS: No pneumothorax. The cardiomediastinal silhouette is stable.a bibasilar opacities are identified, left greater than right. No other acute abnormalities. IMPRESSION: New bibasilar opacities, left greater than right,  concerning for pneumonia in the appropriate clinical setting. Atelectasis is possible. Recommend clinical correlation. Electronically Signed   By: Dorise Bullion III M.D.   On: 12/21/2020 12:08       LOS: 4 days   [facility] Pager on [email or web address]  12/22/2020, 10:43 AM

## 2020-12-23 DIAGNOSIS — F10239 Alcohol dependence with withdrawal, unspecified: Secondary | ICD-10-CM | POA: Diagnosis not present

## 2020-12-23 DIAGNOSIS — K209 Esophagitis, unspecified without bleeding: Secondary | ICD-10-CM | POA: Diagnosis not present

## 2020-12-23 DIAGNOSIS — J690 Pneumonitis due to inhalation of food and vomit: Secondary | ICD-10-CM | POA: Diagnosis not present

## 2020-12-23 LAB — TYPE AND SCREEN
ABO/RH(D): O POS
Antibody Screen: NEGATIVE
Unit division: 0

## 2020-12-23 LAB — COMPREHENSIVE METABOLIC PANEL
ALT: 111 U/L — ABNORMAL HIGH (ref 0–44)
AST: 151 U/L — ABNORMAL HIGH (ref 15–41)
Albumin: 3 g/dL — ABNORMAL LOW (ref 3.5–5.0)
Alkaline Phosphatase: 143 U/L — ABNORMAL HIGH (ref 38–126)
Anion gap: 5 (ref 5–15)
BUN: 11 mg/dL (ref 8–23)
CO2: 29 mmol/L (ref 22–32)
Calcium: 8.6 mg/dL — ABNORMAL LOW (ref 8.9–10.3)
Chloride: 98 mmol/L (ref 98–111)
Creatinine, Ser: 0.76 mg/dL (ref 0.61–1.24)
GFR, Estimated: >60 mL/min (ref >60)
Glucose, Bld: 101 mg/dL — ABNORMAL HIGH (ref 70–99)
Potassium: 3.6 mmol/L (ref 3.5–5.1)
Sodium: 132 mmol/L — ABNORMAL LOW (ref 135–145)
Total Bilirubin: 1.1 mg/dL (ref 0.3–1.2)
Total Protein: 5.8 g/dL — ABNORMAL LOW (ref 6.5–8.1)

## 2020-12-23 LAB — CBC
HCT: 25.8 % — ABNORMAL LOW (ref 39.0–52.0)
Hemoglobin: 9 g/dL — ABNORMAL LOW (ref 13.0–17.0)
MCH: 35.9 pg — ABNORMAL HIGH (ref 26.0–34.0)
MCHC: 34.9 g/dL (ref 30.0–36.0)
MCV: 102.8 fL — ABNORMAL HIGH (ref 80.0–100.0)
Platelets: 83 10*3/uL — ABNORMAL LOW (ref 150–400)
RBC: 2.51 MIL/uL — ABNORMAL LOW (ref 4.22–5.81)
RDW: 16.4 % — ABNORMAL HIGH (ref 11.5–15.5)
WBC: 4.5 10*3/uL (ref 4.0–10.5)
nRBC: 0 % (ref 0.0–0.2)

## 2020-12-23 LAB — CULTURE, BLOOD (SINGLE): Culture: NO GROWTH

## 2020-12-23 LAB — BPAM RBC
Blood Product Expiration Date: 202210162359
ISSUE DATE / TIME: 202209160535
Unit Type and Rh: 5100

## 2020-12-23 MED ORDER — ZOLPIDEM TARTRATE 5 MG PO TABS
5.0000 mg | ORAL_TABLET | Freq: Every evening | ORAL | Status: DC | PRN
  Administered 2020-12-23: 5 mg via ORAL
  Filled 2020-12-23: qty 1

## 2020-12-23 MED ORDER — NEPRO/CARBSTEADY PO LIQD
237.0000 mL | Freq: Three times a day (TID) | ORAL | Status: DC
  Administered 2020-12-23 – 2020-12-24 (×2): 237 mL via ORAL

## 2020-12-23 MED ORDER — PANTOPRAZOLE SODIUM 40 MG PO TBEC
40.0000 mg | DELAYED_RELEASE_TABLET | Freq: Two times a day (BID) | ORAL | Status: DC
  Administered 2020-12-23 – 2020-12-24 (×2): 40 mg via ORAL
  Filled 2020-12-23 (×2): qty 1

## 2020-12-23 NOTE — Progress Notes (Signed)
[facility] PROGRESS NOTE   Fernando [name] IDP:[id number] DOB: 1955/08/14 DOA: 12/18/2020  PCP: [name], MD  Brief History/Interval Summary: 65 y.o. male with medical history significant for chronic diastolic CHF (EF 44-31% in 08/2020);  ETOH dependence with history of DTs, history of prosthetic shoulder joint infection; HTN; and HLD who presents to the ER by EMS for evaluation of weakness.  Patient apparently sustained a fall at home as well.  In the emergency department he was noted to have symptoms and signs of alcohol withdrawal.  Electrolyte abnormalities noted on blood work.  Patient was hospitalized for further management.  Subsequently had a drop in hemoglobin.  Black stools were noted.  Gastroenterology was consulted.   Consultants: Gastroenterology  Procedures:  EGD 9/17 Impression:            - LA Grade B reflux esophagitis with no bleeding.                        - Non-bleeding gastric ulcers with a clean ulcer base                         (Forrest Class III).                        - Erythematous duodenopathy.                        - No specimens collected.  Antibiotics: Anti-infectives (From admission, onward)    Start     Dose/Rate Route Frequency Ordered Stop   12/21/20 1430  amoxicillin-clavulanate (AUGMENTIN) 875-125 MG per tablet 1 tablet        1 tablet Oral Every 12 hours 12/21/20 1331     12/18/20 0915  cefTRIAXone (ROCEPHIN) 2 g in sodium chloride 0.9 % 100 mL IVPB        2 g 200 mL/hr over 30 Minutes Intravenous  Once 12/18/20 0906 12/18/20 1110   12/18/20 0915  azithromycin (ZITHROMAX) 500 mg in sodium chloride 0.9 % 250 mL IVPB        500 mg 250 mL/hr over 60 Minutes Intravenous  Once 12/18/20 0906 12/18/20 1210       Subjective/Interval History: Patient awake alert.  Denies any complaints.  Had many questions about going to rehab.  Family is amenable to consider going to rehab.  Willing to talk to the Education officer, museum.  Denies any chest  pain shortness of breath nausea or vomiting at this time.    Assessment/Plan:  Alcohol withdrawal syndrome/alcohol abuse Patient was placed on CIWA protocol.  He was confused at the earlier part of this admission.  He was tachycardic.  Appears to have stabilized in the last 48 hours.  His mentation is back to baseline.  Continue thiamine multivitamins.  Social worker to provide resources for alcohol rehab. Physical deconditioning is noted.  Reevaluated by PT and OT yesterday and they continue to recommend short-term rehab at skilled nursing facility. Patient was initially reluctant but now willing to consider it.  Social worker to follow.  Upper GI bleed/acute blood loss anemia Noted to have black stools by nursing staff.  Occult blood testing is positive.  Drop in hemoglobin noted although some of it was probably dilutional.  Transfused 1 unit of PRBC.  Hemoglobin responded and has been stable.   Previous records reviewed.  Patient had an upper endoscopy in May  2022 by Dr. [name] in [place].  Showed benign nonobstructing upper esophageal stenosis.  Clean-based nonbleeding gastric ulcers were noted.  Portal hypertensive gastropathy was seen.  Duodenitis and nonbleeding duodenal ulcers were noted. Gastroenterology was subsequently consulted.  Placed on octreotide infusion.  PPI twice daily.  No ascites noted on CT abdomen and pelvis.  INR was 1.0. Patient underwent EGD on 9/17 which showed esophagitis.  Nonbleeding gastric ulcers were also noted.  Erythematous duodenopathy was noted.  No varices were noted.  Octreotide discontinued.  Change Protonix to oral.  Hemoglobin has been stable.  Gastroenterology has signed off.  Aspiration pneumonia Patient was noted to have a cough and oxygen requirements.  X-ray suggested bibasilar opacities raising concern for pneumonia.  He could have aspirated when he was going through withdrawal.  Placed on Augmentin.  He was also given a dose of IV furosemide.  No  further doses were given.  IV fluids were discontinued. Respiratory status is improved.  He is now off of oxygen.  Patient was seen by speech therapy and they are considering doing a modified barium swallow.    Alcoholic ketoacidosis Seems to have improved with IV hydration.  Hyponatremia/hypokalemia/hypomagnesemia Sodium level is low but stable.  Likely due to liver cirrhosis.   Potassium and magnesium levels have been stable.  Alcoholic liver cirrhosis No ascites noted on examination or on CT scan.  AST and ALT elevated likely due to his alcoholism.  Will need outpatient follow-up with gastroenterology for cirrhosis.  Counseling provided regarding alcohol use.  Hepatitis panel was unremarkable in July 2021.  Thrombocytopenia Significant drop in platelet counts noted.  Likely due to bone marrow suppression from alcohol as well as history of liver cirrhosis.  No overt bleeding noted except as mentioned above.   Improvement in platelet counts noted.  Continue to monitor.  No overt bleeding.  History of chronic diastolic CHF Some concern for fluid overload yesterday for which he was given a dose of Lasix.  IV fluids were discontinued.  Remains on carvedilol.  Volume status is stable.  Pancreatic cyst Outpatient monitoring.  Physical deconditioning Seen by PT and OT.  Skilled nursing facility is recommended for rehab.  DVT Prophylaxis: SCDs Code Status: Full code Family Communication: Discussed with patient.  No family at bedside.  He did mention that all of his family is in [place]. Disposition Plan: SNF is recommended.  Status is: Inpatient  Remains inpatient appropriate because:Ongoing diagnostic testing needed not appropriate for outpatient work up and IV treatments appropriate due to intensity of illness or inability to take PO  Dispo: The patient is from: Home              Anticipated d/c is to: Home              Patient currently is not medically stable to d/c.   Difficult  to place patient No     Medications: Scheduled:  sodium chloride   Intravenous Once   allopurinol  100 mg Oral Daily   amoxicillin-clavulanate  1 tablet Oral Q12H   vitamin C  500 mg Oral BID   carvedilol  3.125 mg Oral BID   feeding supplement  1 Container Oral TID BM   multivitamin with minerals  1 tablet Oral Daily   pantoprazole  40 mg Intravenous Q12H   thiamine  100 mg Oral Daily   Continuous:   EXN:[id number] **OR** acetaminophen, albuterol, docusate sodium, LORazepam **OR** LORazepam, nicotine, ondansetron **OR** ondansetron (ZOFRAN) IV   Objective:  Vital Signs  Vitals:   12/22/20 2013 12/22/20 2150 12/23/20 0433 12/23/20 0731  BP: 102/67 95/65 91/65  125/71  Pulse: 90 95 96 98  Resp: 18 16 17 18   Temp: 97.9 F (36.6 C) 98.1 F (36.7 C) 98.2 F (36.8 C) 98.4 F (36.9 C)  TempSrc:    Oral  SpO2: 99% 92% 97% 95%  Weight:      Height:        Intake/Output Summary (Last 24 hours) at 12/23/2020 0930 Last data filed at 12/23/2020 0733 Gross per 24 hour  Intake 1160 ml  Output 1300 ml  Net -140 ml    Filed Weights   12/18/20 0251 12/18/20 1854 12/19/20 1540  Weight: 74.8 kg 65.5 kg 61.3 kg    General appearance: Awake alert.  In no distress Resp: Improved air entry bilaterally.  Few crackles at the bases.  No wheezing or rhonchi. Cardio: S1-S2 is normal regular.  No S3-S4.  No rubs murmurs or bruit GI: Abdomen is soft.  Nontender nondistended.  Bowel sounds are present normal.  No masses organomegaly Extremities: No edema.  Full range of motion of lower extremities. Neurologic: Alert and oriented x3.  No focal neurological deficits.     Lab Results:  Data Reviewed: I have personally reviewed following labs and imaging studies  CBC: Recent Labs  Lab 12/18/20 0307 12/19/20 0157 12/19/20 1242 12/20/20 0531 12/21/20 0545 12/22/20 0118 12/22/20 0600 12/23/20 0430  WBC 9.4 4.5  --  5.6 4.0  --  4.4 4.5  NEUTROABS 8.0*  --   --   --   --   --    --   --   HGB 11.1* 7.6*   < > 10.5* 9.3* 8.7* 9.0* 9.0*  HCT 32.4* 20.9*   < > 28.3* 25.7* 24.9* 25.7* 25.8*  MCV 108.7* 103.5*  --  97.3 98.5  --  99.6 102.8*  PLT 103* 44*  --  48* 44*  --  60* 83*   < > = values in this interval not displayed.     Basic Metabolic Panel: Recent Labs  Lab 12/18/20 0920 12/19/20 0157 12/19/20 1242 12/20/20 0531 12/21/20 0545 12/22/20 0600 12/23/20 0430  NA  --  128* 130* 130* 130* 129* 132*  K  --  2.8* 3.3* 3.7 3.2* 4.0 3.6  CL  --  87* 88* 89* 91* 95* 98  CO2  --  28 26 31 31 30 29   GLUCOSE  --  129* 87 145* 117* 113* 101*  BUN  --  20 16 11 8 14 11   CREATININE  --  0.74 0.70 0.56* 0.64 0.66 0.76  CALCIUM  --  8.5* 8.7* 8.4* 8.3* 8.2* 8.6*  MG 1.7 1.7  --   --  1.6* 2.0  --      GFR: Estimated Creatinine Clearance: 77.7 mL/min (by C-G formula based on SCr of 0.76 mg/dL).  Liver Function Tests: Recent Labs  Lab 12/18/20 0307 12/20/20 0531 12/21/20 0545 12/22/20 0600 12/23/20 0430  AST 105* 68* 132* 171* 151*  ALT 68* 38 57* 92* 111*  ALKPHOS 163* 116 113 127* 143*  BILITOT 3.0* 2.3* 1.9* 1.4* 1.1  PROT 7.4 6.2* 5.6* 5.6* 5.8*  ALBUMIN 4.0 3.5 3.0* 3.0* 3.0*     Recent Labs  Lab 12/18/20 0920  LIPASE 133*    Recent Labs  Lab 12/19/20 0157  AMMONIA 35     Coagulation Profile: Recent Labs  Lab 12/18/20 0920 12/19/20 0403  INR 1.1  1.0     Cardiac Enzymes: Recent Labs  Lab 12/18/20 0920  CKTOTAL 123       Recent Results (from the past 240 hour(s))  Blood culture (single)     Status: None   Collection Time: 12/18/20  9:20 AM   Specimen: BLOOD  Result Value Ref Range Status   Specimen Description BLOOD  Final   Special Requests   Final    BAA Blood Culture results may not be optimal due to an excessive volume of blood received in culture bottles   Culture   Final    NO GROWTH 5 DAYS Performed at [facility], [address] [place], [place] [address]    Report Status 12/23/2020  FINAL  Final  Resp Panel by RT-PCR (Flu A&B, Covid) Nasopharyngeal Swab     Status: None   Collection Time: 12/18/20  9:22 AM   Specimen: Nasopharyngeal Swab; Nasopharyngeal(NP) swabs in vial transport medium  Result Value Ref Range Status   SARS Coronavirus 2 by RT PCR NEGATIVE NEGATIVE Final    Comment: (NOTE) SARS-CoV-2 target nucleic acids are NOT DETECTED.  The SARS-CoV-2 RNA is generally detectable in upper respiratory specimens during the acute phase of infection. The lowest concentration of SARS-CoV-2 viral copies this assay can detect is 138 copies/mL. A negative result does not preclude SARS-Cov-2 infection and should not be used as the sole basis for treatment or other patient management decisions. A negative result may occur with  improper specimen collection/handling, submission of specimen other than nasopharyngeal swab, presence of viral mutation(s) within the areas targeted by this assay, and inadequate number of viral copies(<138 copies/mL). A negative result must be combined with clinical observations, patient history, and epidemiological information. The expected result is Negative.  Fact Sheet for Patients:  EntrepreneurPulse.com.au  Fact Sheet for Healthcare Providers:  IncredibleEmployment.be  This test is no t yet approved or cleared by the [place] FDA and  has been authorized for detection and/or diagnosis of SARS-CoV-2 by FDA under an Emergency Use Authorization (EUA). This EUA will remain  in effect (meaning this test can be used) for the duration of the COVID-19 declaration under Section 564(b)(1) of the Act, 21 U.S.C.section 360bbb-3(b)(1), unless the authorization is terminated  or revoked sooner.       Influenza A by PCR NEGATIVE NEGATIVE Final   Influenza B by PCR NEGATIVE NEGATIVE Final    Comment: (NOTE) The Xpert Xpress SARS-CoV-2/FLU/RSV plus assay is intended as an aid in the diagnosis of influenza  from Nasopharyngeal swab specimens and should not be used as a sole basis for treatment. Nasal washings and aspirates are unacceptable for Xpert Xpress SARS-CoV-2/FLU/RSV testing.  Fact Sheet for Patients: EntrepreneurPulse.com.au  Fact Sheet for Healthcare Providers: IncredibleEmployment.be  This test is not yet approved or cleared by the [place] FDA and has been authorized for detection and/or diagnosis of SARS-CoV-2 by FDA under an Emergency Use Authorization (EUA). This EUA will remain in effect (meaning this test can be used) for the duration of the COVID-19 declaration under Section 564(b)(1) of the Act, 21 U.S.C. section 360bbb-3(b)(1), unless the authorization is terminated or revoked.  Performed at [facility], [address] [place], [place] [address]   Culture, blood (single) w Reflex to ID Panel     Status: None (Preliminary result)   Collection Time: 12/19/20  1:57 AM   Specimen: BLOOD  Result Value Ref Range Status   Specimen Description BLOOD RIGHT HAND  Final   Special  Requests   Final    BOTTLES DRAWN AEROBIC AND ANAEROBIC Blood Culture results may not be optimal due to an excessive volume of blood received in culture bottles   Culture   Final    NO GROWTH 4 DAYS Performed at [facility], [address], [place], [place] [address]    Report Status PENDING  Incomplete       Radiology Studies: DG Chest Port 1 View  Result Date: 12/21/2020 CLINICAL DATA:  Wheezing. Alcoholic ketoacidosis. Metabolic acidosis. EXAM: PORTABLE CHEST 1 VIEW COMPARISON:  None. FINDINGS: No pneumothorax. The cardiomediastinal silhouette is stable.a bibasilar opacities are identified, left greater than right. No other acute abnormalities. IMPRESSION: New bibasilar opacities, left greater than right, concerning for pneumonia in the appropriate clinical setting. Atelectasis is possible. Recommend clinical correlation.  Electronically Signed   By: Dorise Bullion III M.D.   On: 12/21/2020 12:08       LOS: 5 days   [facility] Pager on [email or web address]  12/23/2020, 9:30 AM

## 2020-12-23 NOTE — Progress Notes (Signed)
SLP Cancellation Note  Patient Details Name: Fernando [name] MRN: [id number] DOB: 1955/06/11   Cancelled treatment:       Reason Eval/Treat Not Completed:  (MBSS rescheduled for tomorrow Wed., 12/24/20. NSG updated.) CM updated alos.     Orinda Kenner, MS, CCC-SLP Speech Language Pathologist Rehab Services [phone] [facility] 12/23/2020, 2:41 PM

## 2020-12-23 NOTE — TOC Progression Note (Signed)
Transition of Care (TOC) - Progression Note    Patient Details  Name: Fernando [name] MRN: [id number] Date of Birth: 09/14/1955  Transition of Care (TOC) CM/SW Contact   M , RN Phone Number: 12/23/2020, 10:54 AM  Clinical Narrative:    RNCM met with patient at the bedside.  Patient is awake and alert and sitting in the recliner.  Patient reports that he does not want to go to rehab at nursing facility he wants to go home.  Patient does agree to home health services and has used [facility] in the past.  Jason with [facility] given referral for RN, PT, OT and SW.  Patient reports that he has canes and walkers at home.  He will try and get his neighbor to pick him up but he may need a ride home at discharge.     Expected Discharge Plan: Home w Home Health Services Barriers to Discharge: Continued Medical Work up  Expected Discharge Plan and Services Expected Discharge Plan: Home w Home Health Services   Discharge Planning Services: CM Consult Post Acute Care Choice: Home Health Living arrangements for the past 2 months: Single Family Home                 DME Arranged: N/A DME Agency: NA       HH Arranged: RN, PT, OT, Social Work HH Agency: [facility] Date HH Agency Contacted: 12/23/20 Time HH Agency Contacted: 1053 Representative spoke with at HH Agency: Jason   Social Determinants of Health (SDOH) Interventions    Readmission Risk Interventions Readmission Risk Prevention Plan 12/19/2020  Transportation Screening Complete  PCP or Specialist Appt within 3-5 Days Complete  Social Work Consult for Recovery Care Planning/Counseling Complete  Palliative Care Screening Not Applicable  Medication Review (RN Care Manager) Complete  Some recent data might be hidden    

## 2020-12-24 ENCOUNTER — Inpatient Hospital Stay: Payer: Medicare Other

## 2020-12-24 DIAGNOSIS — I5032 Chronic diastolic (congestive) heart failure: Secondary | ICD-10-CM

## 2020-12-24 DIAGNOSIS — F10230 Alcohol dependence with withdrawal, uncomplicated: Secondary | ICD-10-CM

## 2020-12-24 DIAGNOSIS — E440 Moderate protein-calorie malnutrition: Secondary | ICD-10-CM

## 2020-12-24 LAB — COMPREHENSIVE METABOLIC PANEL
ALT: 100 U/L — ABNORMAL HIGH (ref 0–44)
AST: 97 U/L — ABNORMAL HIGH (ref 15–41)
Albumin: 3 g/dL — ABNORMAL LOW (ref 3.5–5.0)
Alkaline Phosphatase: 129 U/L — ABNORMAL HIGH (ref 38–126)
Anion gap: 6 (ref 5–15)
BUN: 10 mg/dL (ref 8–23)
CO2: 29 mmol/L (ref 22–32)
Calcium: 9.1 mg/dL (ref 8.9–10.3)
Chloride: 97 mmol/L — ABNORMAL LOW (ref 98–111)
Creatinine, Ser: 0.68 mg/dL (ref 0.61–1.24)
GFR, Estimated: >60 mL/min (ref >60)
Glucose, Bld: 112 mg/dL — ABNORMAL HIGH (ref 70–99)
Potassium: 3.3 mmol/L — ABNORMAL LOW (ref 3.5–5.1)
Sodium: 132 mmol/L — ABNORMAL LOW (ref 135–145)
Total Bilirubin: 0.9 mg/dL (ref 0.3–1.2)
Total Protein: 5.8 g/dL — ABNORMAL LOW (ref 6.5–8.1)

## 2020-12-24 LAB — CULTURE, BLOOD (SINGLE): Culture: NO GROWTH

## 2020-12-24 LAB — CBC
HCT: 26.6 % — ABNORMAL LOW (ref 39.0–52.0)
Hemoglobin: 9 g/dL — ABNORMAL LOW (ref 13.0–17.0)
MCH: 34.5 pg — ABNORMAL HIGH (ref 26.0–34.0)
MCHC: 33.8 g/dL (ref 30.0–36.0)
MCV: 101.9 fL — ABNORMAL HIGH (ref 80.0–100.0)
Platelets: 95 10*3/uL — ABNORMAL LOW (ref 150–400)
RBC: 2.61 MIL/uL — ABNORMAL LOW (ref 4.22–5.81)
RDW: 17 % — ABNORMAL HIGH (ref 11.5–15.5)
WBC: 4 10*3/uL (ref 4.0–10.5)
nRBC: 0 % (ref 0.0–0.2)

## 2020-12-24 MED ORDER — PANTOPRAZOLE SODIUM 40 MG PO TBEC: DELAYED_RELEASE_TABLET | ORAL | 0 refills | Status: DC

## 2020-12-24 NOTE — Evaluation (Signed)
Objective Swallowing Evaluation: Type of Study: MBS-Modified Barium Swallow Study   Patient Details  Name: Fernando [name] MRN: [id number] Date of Birth: 1955-10-28  Today's Date: 12/24/2020 Time: SLP Start Time (ACUTE ONLY): 1210 -SLP Stop Time (ACUTE ONLY): 1310  SLP Time Calculation (min) (ACUTE ONLY): 60 min   Past Medical History:  Past Medical History:  Diagnosis Date   Alcohol dependence ([place])    Arthritis    Cataract    Chronic systolic (congestive) heart failure (HCC)    EF 25-30% in 10/2019   Dyslipidemia    Essential hypertension    History of kidney stones    Hyperlipidemia    Hypertension    Pneumonia    Prosthetic shoulder infection, initial encounter [facility])    Past Surgical History:  Past Surgical History:  Procedure Laterality Date   BIOPSY  04/21/2020   Procedure: BIOPSY;  Surgeon: Irving Copas., MD;  Location: [place];  Service: Gastroenterology;;   BIOPSY  08/17/2020   Procedure: BIOPSY;  Surgeon: Carol Ada, MD;  Location: [place];  Service: Endoscopy;;   COLONOSCOPY     COLONOSCOPY     multiple   ESOPHAGOGASTRODUODENOSCOPY N/A 12/20/2020   Procedure: ESOPHAGOGASTRODUODENOSCOPY (EGD);  Surgeon: Virgel Manifold, MD;  Location: [facility] ENDOSCOPY;  Service: Endoscopy;  Laterality: N/A;   ESOPHAGOGASTRODUODENOSCOPY (EGD) WITH PROPOFOL N/A 01/31/2020   Procedure: ESOPHAGOGASTRODUODENOSCOPY (EGD) WITH PROPOFOL;  Surgeon: Doran Stabler, MD;  Location: [place];  Service: Gastroenterology;  Laterality: N/A;   ESOPHAGOGASTRODUODENOSCOPY (EGD) WITH PROPOFOL N/A 04/21/2020   Procedure: ESOPHAGOGASTRODUODENOSCOPY (EGD) WITH PROPOFOL;  Surgeon: Rush Landmark Telford Nab., MD;  Location: [place];  Service: Gastroenterology;  Laterality: N/A;   ESOPHAGOGASTRODUODENOSCOPY (EGD) WITH PROPOFOL N/A 08/17/2020   Procedure: ESOPHAGOGASTRODUODENOSCOPY (EGD) WITH PROPOFOL;  Surgeon: Carol Ada, MD;  Location: [place];  Service: Endoscopy;   Laterality: N/A;   EUS N/A 04/21/2020   Procedure: UPPER ENDOSCOPIC ULTRASOUND (EUS) LINEAR;  Surgeon: Irving Copas., MD;  Location: [place];  Service: Gastroenterology;  Laterality: N/A;   [place]   left men. repair   KNEE SURGERY  1975   REVERSE SHOULDER ARTHROPLASTY Right 06/21/2018   Procedure: REVERSE SHOULDER ARTHROPLASTY;  Surgeon: Hiram Gash, MD;  Location: [facility];  Service: Orthopedics;  Laterality: Right;   ROTATOR CUFF REPAIR     right   ROTATOR CUFF REPAIR     TONSILLECTOMY     TOTAL SHOULDER REPLACEMENT  [id number]   UMBILICAL HERNIA REPAIR      HPI: Pt is a 65 y.o. male with medical history significant for chronic diastolic CHF (EF 75-91% in 08/2020);  ETOH dependence with history of DTs, history of prosthetic shoulder joint infection; hypertension and hyperlipidemia was brought into the hospital with generalized weakness and fall at home.  He was then admitted to the hospital for alcohol withdrawal - Alcohol withdrawal syndrome/alcohol abuse with alcoholic ketoacidosis/alcoholic liver cirrhosis, and possible GI bleed since he had black stools at home.  GI followed while admitted.  CXR: New bibasilar opacities, left greater than right, concerning for  pneumonia in the appropriate clinical setting. Atelectasis is  possible.  Noted Upper Endoscopy note from 12/20/2020: "Grade B reflux esophagitis with no bleeding. Non-bleeding gastric ulcers.". Protonix was recommended by GI.  Due to pt's performance on recent MBSS in 08/2020(delayed pharyngeal swallow initiation w/ intermittent penetration (PAS 3, 5) of individual sips of thin liquids with trace delayed aspiration (PAS 7). Immediate aspiration (PAS  7) was noted once with thin liquids via straw. Instances of aspiration consistently triggered coughing but this was ineffective in expelling aspirated material. Mild vallecular and pyriform sinus residue. Pharyngeal residue was improved with a liquid  wash.", and current CXR Imaging, recommended f/u w/ Objective swallow assessment - MBSS was ordered for today.  PT/OT recommended SNF; pt refused and wanted to go home w/ HH.   Subjective: pt awake, verbal. Needed min cues for follow through w/ tasks d/t distractibility.    Assessment / Plan / Recommendation  CHL IP CLINICAL IMPRESSIONS 12/24/2020  Clinical Impression Pt appeared to exhibit Min Pharyngeal phase dysphagia c/b min delay in pharyngeal swallow initiation w/ thin liquids but adequate airway protection w/ NO laryngeal penetration or aspiration noted during this study. Mild vallecular and pyriform sinus residue noted post initial swallow, moreso w/ thin liquids VIA CUP vs solids. This indicates minimally reduced effort of pharyngeal pressure/parestalsis and laryngeal excursion during the swallow. The pharyngeal residue was reduced w/ a f/u, DRY swallow. No buildup of pharyngeal residue occurred as the study/po trials continued. Oral phase appeared grossly [facility] for bolus management, mastication, and timely A-P transfer of trials w/ all consistencies. No oral residue remained. No Cervical Esophageal dysmotility (viewable) noted.  Pt fed self and followed instructions for Small sips and use of a f/u, DRY swallow to reduce pharyngeal residue. NO STRAWS used. Encouraged pt to follow the Dry swallow strategy as well as general aspiration precautions as discussed w/ him. Also encouraged him to monitor his Pulmonary status and f/u w/ PCP if any concerns re: his swallowing and/or decline in Pulmonary status.  SLP Visit Diagnosis Dysphagia, pharyngeal phase (R13.13)  Attention and concentration deficit following --  Frontal lobe and executive function deficit following --  Impact on safety and function Mild aspiration risk;Risk for inadequate nutrition/hydration      CHL IP TREATMENT RECOMMENDATION 12/24/2020  Treatment Recommendations No treatment recommended at this time     Prognosis 12/24/2020   Prognosis for Safe Diet Advancement Fair  Barriers to Reach Goals Behavior  Barriers/Prognosis Comment --    CHL IP DIET RECOMMENDATION 12/24/2020  SLP Diet Recommendations Regular solids;Thin liquid  Liquid Administration via Cup;No straw  Medication Administration Whole meds with puree  Compensations Minimize environmental distractions;Slow rate;Small sips/bites;Multiple dry swallows after each bite/sip  Postural Changes Remain semi-upright after after feeds/meals (Comment);Seated upright at 90 degrees      CHL IP OTHER RECOMMENDATIONS 12/24/2020  Recommended Consults (No Data)  Oral Care Recommendations Oral care BID;Patient independent with oral care  Other Recommendations (No Data)      CHL IP FOLLOW UP RECOMMENDATIONS 12/24/2020  Follow up Recommendations None      CHL IP FREQUENCY AND DURATION 12/24/2020  Speech Therapy Frequency (ACUTE ONLY) (No Data)  Treatment Duration (No Data)           CHL IP ORAL PHASE 12/24/2020  Oral Phase WFL  Oral - Pudding Teaspoon --  Oral - Pudding Cup --  Oral - Honey Teaspoon --  Oral - Honey Cup --  Oral - Nectar Teaspoon --  Oral - Nectar Cup NT  Oral - Nectar Straw --  Oral - Thin Teaspoon --  Oral - Thin Cup 5  Oral - Thin Straw --  Oral - Puree --  Oral - Mech Soft 2  Oral - Regular --  Oral - Multi-Consistency --  Oral - Pill --  Oral Phase - Comment --    CHL IP PHARYNGEAL PHASE 12/24/2020  Pharyngeal Phase Impaired  Pharyngeal- Pudding Teaspoon --  Pharyngeal --  Pharyngeal- Pudding Cup --  Pharyngeal --  Pharyngeal- Honey Teaspoon --  Pharyngeal --  Pharyngeal- Honey Cup --  Pharyngeal --  Pharyngeal- Nectar Teaspoon --  Pharyngeal --  Pharyngeal- Nectar Cup --  Pharyngeal --  Pharyngeal- Nectar Straw --  Pharyngeal --  Pharyngeal- Thin Teaspoon --  Pharyngeal --  Pharyngeal- Thin Cup 5  Pharyngeal --  Pharyngeal- Thin Straw --  Pharyngeal --  Pharyngeal- Puree --  Pharyngeal --  Pharyngeal-  Mechanical Soft 2  Pharyngeal --  Pharyngeal- Regular --  Pharyngeal --  Pharyngeal- Multi-consistency --  Pharyngeal --  Pharyngeal- Pill --  Pharyngeal --  Pharyngeal Comment min delay in pharyngeal swallow initiation w/ thin liquids but adequate airway protection w/ NO laryngeal penetration or aspiration noted; mild vallecular and pyriform sinus residue post initial swallow. Pharyngeal residue was reduced w/ a f/u, DRY swallow.     CHL IP CERVICAL ESOPHAGEAL PHASE 12/24/2020  Cervical Esophageal Phase WFL  Pudding Teaspoon --  Pudding Cup --  Honey Teaspoon --  Honey Cup --  Nectar Teaspoon --  Nectar Cup --  Nectar Straw --  Thin Teaspoon --  Thin Cup 5  Thin Straw --  Puree --  Mechanical Soft 2  Regular --  Multi-consistency --  Pill --  Cervical Esophageal Comment --           Orinda Kenner, MS, CCC-SLP Speech Language Pathologist Rehab Services [phone] [facility] 12/24/2020, 4:28 PM

## 2020-12-24 NOTE — Progress Notes (Signed)
Occupational Therapy Treatment Patient Details Name: Fernando [name] MRN: [id number] DOB: 15-Nov-1955 Today's Date: 12/24/2020   History of present illness Pt is a 65 y.o. male presenting to hospital 9/15 with progressive generalized weakness (s/p fall).  Pt admitted with alcoholic ketoacidosis, metabolic acidosis, and generalized weakness.  PMH includes alcohol dependence, chronic systolic CHF, htn, PNA, acute GI bleeding, pancreatic disease, sinus tachycardia, acute hypoxemic respiratory failure, R reverse shoulder arthroplasty 2020, knee surgery L.   OT comments  Upon entering the room, pt seated in recliner chair and agreeable to OT intervention. Pt reports feeling much better this session. Pt stands from recliner chair and ambulates without use of AD into bathroom for toilet transfer. Pt standing and ambulating in hallways with close supervision for safety 150' feet. Pt did navigate himself to incorrect room but realized it was wrong and locates correct room without further cuing. Pt returning to recliner chair and HR increased to 154 bpm with self care tasks.    Recommendations for follow up therapy are one component of a multi-disciplinary discharge planning process, led by the attending physician.  Recommendations may be updated based on patient status, additional functional criteria and insurance authorization.    Follow Up Recommendations  Home health OT;Supervision - Intermittent    Equipment Recommendations  None recommended by OT       Precautions / Restrictions Precautions Precautions: Fall Restrictions Weight Bearing Restrictions: No       Mobility Bed Mobility               General bed mobility comments: seated in recliner chair    Transfers Overall transfer level: Needs assistance Equipment used: None Transfers: Sit to/from Stand;Stand Pivot Transfers Sit to Stand: Supervision Stand pivot transfers: Supervision       General transfer comment: supervision  and cuing for safety    Balance Overall balance assessment: Needs assistance Sitting-balance support: No upper extremity supported;Feet supported Sitting balance-Leahy Scale: Good     Standing balance support: During functional activity Standing balance-Leahy Scale: Fair                             ADL either performed or assessed with clinical judgement   ADL Overall ADL's : Needs assistance/impaired                                       General ADL Comments: supervision overall for self care tasks and functional transfers     Vision Patient Visual Report: No change from baseline            Cognition Arousal/Alertness: Awake/alert Behavior During Therapy: WFL for tasks assessed/performed Overall Cognitive Status: Within Functional Limits for tasks assessed                                 General Comments: Pt needing cuing for decreased safety awareness and close supervision for safety with ambulation and functional transfers  without use of AD.                   Pertinent Vitals/ Pain       Pain Assessment: No/denies pain   Frequency  Min 2X/week        Progress Toward Goals  OT Goals(current goals can now be found in the care plan  section)  Progress towards OT goals: Progressing toward goals  Acute Rehab OT Goals Patient Stated Goal: to improve strength and balance OT Goal Formulation: Patient unable to participate in goal setting Time For Goal Achievement: 01/03/21 Potential to Achieve Goals: [place] Discharge plan remains appropriate;Frequency remains appropriate       AM-PAC OT "6 Clicks" Daily Activity     Outcome Measure   Help from another person eating meals?: None Help from another person taking care of personal grooming?: None Help from another person toileting, which includes using toliet, bedpan, or urinal?: A Little Help from another person bathing (including washing, rinsing, drying)?: A  Little Help from another person to put on and taking off regular upper body clothing?: None Help from another person to put on and taking off regular lower body clothing?: A Little 6 Click Score: 21    End of Session    OT Visit Diagnosis: Unsteadiness on feet (R26.81);Muscle weakness (generalized) (M62.81)   Activity Tolerance Patient tolerated treatment well   Patient Left in chair;with call bell/phone within reach;with chair alarm set   Nurse Communication Mobility status        Time: 3744-5146 OT Time Calculation (min): 23 min  Charges: OT General Charges $OT Visit: 1 Visit OT Treatments $Self Care/Home Management : 8-22 mins $Therapeutic Activity: 8-22 mins  Darleen Crocker, MS, OTR/L , CBIS ascom [phone]  12/24/20, 12:46 PM

## 2020-12-24 NOTE — Progress Notes (Signed)
Patient is being discharged home to self care. Personal property has been returned to patient.Patient has been education, copy of discharge instructions given to patient, IVs removed. Transportation home has been arranged by Case management.

## 2020-12-24 NOTE — Discharge Summary (Signed)
Physician Discharge Summary  Fernando [name] JIR:[id number] DOB: 08/21/1955 DOA: 12/18/2020  PCP: [name], MD  Admit date: 12/18/2020 Discharge date: 12/24/2020  Admitted From: Home  Discharge disposition: Home with home health  Recommendations for Outpatient Follow-Up:   Follow up with your primary care provider in one week.  Check CBC, BMP, magnesium in the next visit Patient should be advised to abstinence from alcohol completely  Discharge Diagnosis:   Principal Problem:   Alcoholic ketoacidosis Active Problems:   Essential hypertension   Alcohol dependence ([place])   Alcoholic cirrhosis of liver without ascites (HCC)   Chronic diastolic CHF (congestive heart failure) (HCC)   Malnutrition of moderate degree   Discharge Condition: Improved.  Diet recommendation: Low sodium, heart healthy.    Wound care: None.  Code status: Full.   History of Present Illness:   65 y.o. male with medical history significant for chronic diastolic CHF (EF 75-10% in 08/2020);  ETOH dependence with history of DTs, history of prosthetic shoulder joint infection; hypertension and hyperlipidemia was brought into the hospital with generalized weakness and fall at home.  He was then admitted to the hospital for alcohol withdrawal, possible GI bleed since he had black stools at home.  GI was then consulted.  Hospital Course:   Following conditions were addressed during hospitalization as listed below,  Alcohol withdrawal syndrome/alcohol abuse with alcoholic ketoacidosis/alcoholic liver cirrhosis Patient was initially put on CIWA protocol.  He subsequently stabilized and did not have any alcohol withdrawal symptoms prior to discharge.  Continue thiamine multivitamin and folic acid on discharge.  Will need to follow-up with GI as outpatient.  Upper GI bleed/acute blood loss anemia Patient did have some black stools and Hemoccult was positive.  Patient received 1 unit of packed RBC.  Of  note patient had endoscopy in May 2022 by Dr. [name] and gastric ulcers were noted at that time with portal hypertensive gastropathy and esophageal stenosis.  GI was consulted during the hospitalization.  Initially the patient was on octreotide drip but was continued on PPI twice daily.  He underwent EGD on 12/20/2020 which showed esophagitis with nonbleeding gastric ulcers.  Patient was advised Protonix twice daily on discharge.     Aspiration pneumonia Patient underwent modified barium swallow which he passed.  He was given antibiotics during hospitalization.  Patient has completed course of antibiotic.  No breathing issues at this time.  Hyponatremia/hypokalemia Mild.  Replenished.  Thrombocytopenia Chronic thrombocytopenia secondary to alcohol abuse.  No bleeding issues at this time.  Follow-up as outpatient with CBC.  History of chronic diastolic CHF Compensated at this time continue Coreg and Lasix from home.   Pancreatic cyst Outpatient monitoring.  No acute issues.  Physical deconditioning Seen by PT and OT.  PT OT recommended skilled facility but patient is adamant about going home.  He was able to ambulate much better prior to going home he will be arranged for home health OT PT RN and social work on discharge  Disposition.  At this time, patient is stable for disposition home with outpatient PCP follow-up.  Medical Consultants:   Gastroenterology  Procedures:    EGD 9/17 Subjective:   Today, patient was seen and examined at bedside.  Patient wishes to go home.  Denies any tremors, hallucinations, sweating, was able to swallow and ambulate well  Discharge Exam:   Vitals:   12/24/20 0412 12/24/20 0722  BP: (!) 95/58 116/66  Pulse: 88 96  Resp: 16 20  Temp: 98.3  F (36.8 C) 98.6 F (37 C)  SpO2: 96% 95%   Vitals:   12/23/20 1941 12/23/20 2335 12/24/20 0412 12/24/20 0722  BP: 98/65 113/71 (!) 95/58 116/66  Pulse: 94 (!) 105 88 96  Resp: 16 16 16 20   Temp:  98.6 F (37 C) 98.4 F (36.9 C) 98.3 F (36.8 C) 98.6 F (37 C)  TempSrc: Oral Oral Oral Oral  SpO2: 98% 97% 96% 95%  Weight:      Height:        General: Alert awake, not in obvious distress HENT: pupils equally reacting to light,  No scleral pallor or icterus noted. Oral mucosa is moist.  Chest:  Clear breath sounds.  Diminished breath sounds bilaterally. No crackles or wheezes.  CVS: S1 &S2 heard. No murmur.  Regular rate and rhythm. Abdomen: Soft, nontender, nondistended.  Bowel sounds are heard.   Extremities: No cyanosis, clubbing or edema.  Peripheral pulses are palpable. Psych: Alert, awake and oriented, normal mood CNS:  No cranial nerve deficits.  Power equal in all extremities.   Skin: Warm and dry.  No rashes noted.  The results of significant diagnostics from this hospitalization (including imaging, microbiology, ancillary and laboratory) are listed below for reference.     Diagnostic Studies:   CT HEAD WO CONTRAST (5MM)  Result Date: 12/18/2020 CLINICAL DATA:  Mental status change with unknown cause EXAM: CT HEAD WITHOUT CONTRAST TECHNIQUE: Contiguous axial images were obtained from the base of the skull through the vertex without intravenous contrast. COMPARISON:  09/01/2020 FINDINGS: Brain: No evidence of acute infarction, hemorrhage, hydrocephalus, extra-axial collection or mass lesion/mass effect. Cerebral volume loss which is generalized. Vascular: No hyperdense vessel or unexpected calcification. Skull: Normal. Negative for fracture or focal lesion. Sinuses/Orbits: Mild opacification in the right maxillary sinus. Bilateral cataract resection Other: No acute finding. IMPRESSION: 1. No acute finding. 2. Generalized brain atrophy. Electronically Signed   By: Jorje Guild M.D.   On: 12/18/2020 09:09   CT ABDOMEN PELVIS W CONTRAST  Result Date: 12/18/2020 CLINICAL DATA:  Nausea, vomiting, abdominal pain EXAM: CT ABDOMEN AND PELVIS WITH CONTRAST TECHNIQUE:  Multidetector CT imaging of the abdomen and pelvis was performed using the standard protocol following bolus administration of intravenous contrast. CONTRAST:  148mL OMNIPAQUE IOHEXOL 350 MG/ML SOLN COMPARISON:  05/01/2020 FINDINGS: Lower chest: Coronary artery and aortic calcifications. No acute abnormality. Hepatobiliary: Diffuse low-density throughout the liver compatible with fatty infiltration. No focal abnormality. Gallbladder unremarkable. Pancreas: 3.3 cm cyst in the pancreatic tail compared to 4 cm previously. No ductal dilatation. Spleen: No focal abnormality.  Normal size. Adrenals/Urinary Tract: Bilateral renal cysts, the largest on the right measures 2.6 cm. No hydronephrosis. No stones. Adrenal glands and urinary bladder unremarkable. Stomach/Bowel: Normal appendix. Stomach, large and small bowel grossly unremarkable. Vascular/Lymphatic: Aortic atherosclerosis. No evidence of aneurysm or adenopathy. Reproductive: No visible focal abnormality. Other: No free fluid or free air. Musculoskeletal: Diffuse degenerative disc and facet disease. No acute bony abnormality. IMPRESSION: Hepatic steatosis. 3.3 cm cyst in the pancreatic tail compared to 4.1 cm previously. This may reflect a pseudo cyst, but continued follow-up recommended. Repeat CT in 6-12 months suggested. Aortic atherosclerosis, coronary artery disease. Electronically Signed   By: Rolm Baptise M.D.   On: 12/18/2020 11:05   DG Chest Port 1 View  Result Date: 12/18/2020 CLINICAL DATA:  Shortness of breath EXAM: PORTABLE CHEST 1 VIEW COMPARISON:  Prior today and 08/29/2020 FINDINGS: The heart size and mediastinal contours are within normal limits. Low  lung volumes are again seen. Chronic pulmonary interstitial prominence is again seen predominately in the lung bases, and without significant change. No evidence of superimposed pulmonary infiltrate or pleural effusion. Right shoulder prosthesis again noted IMPRESSION: Chronic bibasilar-predominant  interstitial lung disease. No acute findings. Electronically Signed   By: Marlaine Hind M.D.   On: 12/18/2020 18:57   DG Chest Portable 1 View  Result Date: 12/18/2020 CLINICAL DATA:  Weakness, cough EXAM: PORTABLE CHEST 1 VIEW COMPARISON:  Chest radiograph 08/29/2020 FINDINGS: The cardiomediastinal silhouette is stable, allowing for slight rightward patient rotation. There are patchy opacities in the lung bases, left worse than right. The left basilar opacities are new/increased compared to the prior study. The upper lungs are well aerated. There is no pleural effusion. There is no pneumothorax. Right shoulder arthroplasty hardware is noted. IMPRESSION: Patchy opacities in the left base are increased from prior, concerning for infection in the correct clinical setting. Recommend follow-up radiographs in 6-8 weeks to assess for resolution. Electronically Signed   By: Valetta Mole M.D.   On: 12/18/2020 08:58     Labs:   Basic Metabolic Panel: Recent Labs  Lab 12/18/20 0920 12/19/20 0157 12/19/20 1242 12/20/20 0531 12/21/20 0545 12/22/20 0600 12/23/20 0430 12/24/20 0430  NA  --  128*   < > 130* 130* 129* 132* 132*  K  --  2.8*   < > 3.7 3.2* 4.0 3.6 3.3*  CL  --  87*   < > 89* 91* 95* 98 97*  CO2  --  28   < > 31 31 30 29 29   GLUCOSE  --  129*   < > 145* 117* 113* 101* 112*  BUN  --  20   < > 11 8 14 11 10   CREATININE  --  0.74   < > 0.56* 0.64 0.66 0.76 0.68  CALCIUM  --  8.5*   < > 8.4* 8.3* 8.2* 8.6* 9.1  MG 1.7 1.7  --   --  1.6* 2.0  --   --    < > = values in this interval not displayed.   GFR Estimated Creatinine Clearance: 77.7 mL/min (by C-G formula based on SCr of 0.68 mg/dL). Liver Function Tests: Recent Labs  Lab 12/20/20 0531 12/21/20 0545 12/22/20 0600 12/23/20 0430 12/24/20 0430  AST 68* 132* 171* 151* 97*  ALT 38 57* 92* 111* 100*  ALKPHOS 116 113 127* 143* 129*  BILITOT 2.3* 1.9* 1.4* 1.1 0.9  PROT 6.2* 5.6* 5.6* 5.8* 5.8*  ALBUMIN 3.5 3.0* 3.0* 3.0* 3.0*    Recent Labs  Lab 12/18/20 0920  LIPASE 133*   Recent Labs  Lab 12/19/20 0157  AMMONIA 35   Coagulation profile Recent Labs  Lab 12/18/20 0920 12/19/20 0403  INR 1.1 1.0    CBC: Recent Labs  Lab 12/18/20 0307 12/19/20 0157 12/20/20 0531 12/21/20 0545 12/22/20 0118 12/22/20 0600 12/23/20 0430 12/24/20 0430  WBC 9.4   < > 5.6 4.0  --  4.4 4.5 4.0  NEUTROABS 8.0*  --   --   --   --   --   --   --   HGB 11.1*   < > 10.5* 9.3* 8.7* 9.0* 9.0* 9.0*  HCT 32.4*   < > 28.3* 25.7* 24.9* 25.7* 25.8* 26.6*  MCV 108.7*   < > 97.3 98.5  --  99.6 102.8* 101.9*  PLT 103*   < > 48* 44*  --  60* 83* 95*   < > =  values in this interval not displayed.   Cardiac Enzymes: Recent Labs  Lab 12/18/20 0920  CKTOTAL 123   BNP: Invalid input(s): POCBNP CBG: Recent Labs  Lab 12/22/20 1520 12/22/20 2009  GLUCAP 152* 132*   D-Dimer No results for input(s): DDIMER in the last 72 hours. Hgb A1c No results for input(s): HGBA1C in the last 72 hours. Lipid Profile No results for input(s): CHOL, HDL, LDLCALC, TRIG, CHOLHDL, LDLDIRECT in the last 72 hours. Thyroid function studies No results for input(s): TSH, T4TOTAL, T3FREE, THYROIDAB in the last 72 hours.  Invalid input(s): FREET3 Anemia work up No results for input(s): VITAMINB12, FOLATE, FERRITIN, TIBC, IRON, RETICCTPCT in the last 72 hours. Microbiology Recent Results (from the past 240 hour(s))  Blood culture (single)     Status: None   Collection Time: 12/18/20  9:20 AM   Specimen: BLOOD  Result Value Ref Range Status   Specimen Description BLOOD  Final   Special Requests   Final    BAA Blood Culture results may not be optimal due to an excessive volume of blood received in culture bottles   Culture   Final    NO GROWTH 5 DAYS Performed at [facility], [address] [place], [place] [address]    Report Status 12/23/2020 FINAL  Final  Resp Panel by RT-PCR (Flu A&B, Covid) Nasopharyngeal Swab     Status:  None   Collection Time: 12/18/20  9:22 AM   Specimen: Nasopharyngeal Swab; Nasopharyngeal(NP) swabs in vial transport medium  Result Value Ref Range Status   SARS Coronavirus 2 by RT PCR NEGATIVE NEGATIVE Final    Comment: (NOTE) SARS-CoV-2 target nucleic acids are NOT DETECTED.  The SARS-CoV-2 RNA is generally detectable in upper respiratory specimens during the acute phase of infection. The lowest concentration of SARS-CoV-2 viral copies this assay can detect is 138 copies/mL. A negative result does not preclude SARS-Cov-2 infection and should not be used as the sole basis for treatment or other patient management decisions. A negative result may occur with  improper specimen collection/handling, submission of specimen other than nasopharyngeal swab, presence of viral mutation(s) within the areas targeted by this assay, and inadequate number of viral copies(<138 copies/mL). A negative result must be combined with clinical observations, patient history, and epidemiological information. The expected result is Negative.  Fact Sheet for Patients:  EntrepreneurPulse.com.au  Fact Sheet for Healthcare Providers:  IncredibleEmployment.be  This test is no t yet approved or cleared by the [place] FDA and  has been authorized for detection and/or diagnosis of SARS-CoV-2 by FDA under an Emergency Use Authorization (EUA). This EUA will remain  in effect (meaning this test can be used) for the duration of the COVID-19 declaration under Section 564(b)(1) of the Act, 21 U.S.C.section 360bbb-3(b)(1), unless the authorization is terminated  or revoked sooner.       Influenza A by PCR NEGATIVE NEGATIVE Final   Influenza B by PCR NEGATIVE NEGATIVE Final    Comment: (NOTE) The Xpert Xpress SARS-CoV-2/FLU/RSV plus assay is intended as an aid in the diagnosis of influenza from Nasopharyngeal swab specimens and should not be used as a sole basis for  treatment. Nasal washings and aspirates are unacceptable for Xpert Xpress SARS-CoV-2/FLU/RSV testing.  Fact Sheet for Patients: EntrepreneurPulse.com.au  Fact Sheet for Healthcare Providers: IncredibleEmployment.be  This test is not yet approved or cleared by the [place] FDA and has been authorized for detection and/or diagnosis of SARS-CoV-2 by FDA under an Emergency Use Authorization (EUA).  This EUA will remain in effect (meaning this test can be used) for the duration of the COVID-19 declaration under Section 564(b)(1) of the Act, 21 U.S.C. section 360bbb-3(b)(1), unless the authorization is terminated or revoked.  Performed at [facility], [address] [place], [place] [address]   Culture, blood (single) w Reflex to ID Panel     Status: None   Collection Time: 12/19/20  1:57 AM   Specimen: BLOOD  Result Value Ref Range Status   Specimen Description BLOOD RIGHT HAND  Final   Special Requests   Final    BOTTLES DRAWN AEROBIC AND ANAEROBIC Blood Culture results may not be optimal due to an excessive volume of blood received in culture bottles   Culture   Final    NO GROWTH 5 DAYS Performed at [facility], [address] [place], [place] [address]    Report Status 12/24/2020 FINAL  Final     Discharge Instructions:   Discharge Instructions     Diet general   Complete by: As directed    Discharge instructions   Complete by: As directed    No alcohol please. Please follow with your primary care provider in one week.   Increase activity slowly   Complete by: As directed    No wound care   Complete by: As directed       Allergies as of 12/24/2020       Reactions   Atorvastatin    Other reaction(s): joints ache   Doxycycline    Other reaction(s): rash/hive        Medication List     STOP taking these medications    LORazepam 1 MG tablet Commonly known as: ATIVAN       TAKE these  medications    albuterol (2.5 MG/3ML) 0.083% nebulizer solution Commonly known as: PROVENTIL Take 3 mLs (2.5 mg total) by nebulization every 6 (six) hours as needed for wheezing.   allopurinol 100 MG tablet Commonly known as: ZYLOPRIM Take 100 mg by mouth daily.   arformoterol 15 MCG/2ML Nebu Commonly known as: BROVANA Take 2 mLs (15 mcg total) by nebulization 2 (two) times daily.   budesonide 0.25 MG/2ML nebulizer solution Commonly known as: PULMICORT Take 2 mLs (0.25 mg total) by nebulization 2 (two) times daily.   carvedilol 3.125 MG tablet Commonly known as: COREG Take 1 tablet (3.125 mg total) by mouth 2 (two) times daily. Do not take is Systolic BP is <097   docusate sodium 100 MG capsule Commonly known as: COLACE Take 1 capsule (100 mg total) by mouth 2 (two) times daily as needed for mild constipation.   famotidine 40 MG tablet Commonly known as: PEPCID Take 1 tablet by mouth at bedtime.   folic acid 1 MG tablet Commonly known as: FOLVITE Take 1 mg by mouth daily.   furosemide 40 MG tablet Commonly known as: Lasix Take 1 tablet (40 mg total) by mouth daily.   guaiFENesin-dextromethorphan 100-10 MG/5ML syrup Commonly known as: ROBITUSSIN DM Take 10 mLs by mouth every 6 (six) hours as needed for cough.   losartan 25 MG tablet Commonly known as: Cozaar Take 0.5 tablets (12.5 mg total) by mouth daily. DO not take if systolic blood pressure <353   multivitamin tablet Take 1 tablet by mouth daily.   nicotine 14 mg/24hr patch Commonly known as: NICODERM CQ - dosed in mg/24 hours Place 1 patch (14 mg total) onto the skin daily.   pantoprazole 40 MG tablet Commonly  known as: PROTONIX Take 1 tablet (40 mg total) by mouth 2 (two) times daily for 14 days, THEN 1 tablet (40 mg total) daily. Start taking on: December 24, 2020 What changed: See the new instructions.   revefenacin 175 MCG/3ML nebulizer solution Commonly known as: YUPELRI Take 3 mLs (175 mcg  total) by nebulization daily.   thiamine 100 MG tablet Take 1 tablet (100 mg total) by mouth daily.        Follow-up Information     Avva, Ravisankar, MD Follow up in 1 week(s).   Specialty: Internal Medicine Contact information: [place] [address] [phone]         Buford Dresser, MD .   Specialty: Cardiology Contact information: [address] [place] [address] [phone]                  Time coordinating discharge: 39 minutes  Signed:  Maysen Bonsignore  [facility] 12/24/2020, 1:38 PM

## 2020-12-24 NOTE — TOC Transition Note (Signed)
Transition of Care [facility]) - CM/SW Discharge Note   Patient Details  Name: Fernando [name] MRN: [id number] Date of Birth: 1956/01/31  Transition of Care [facility]) CM/SW Contact:  Shelbie Hutching, RN Phone Number: 12/24/2020, 2:23 PM   Clinical Narrative:    Patient medically cleared for discharge home with home health services today.  Patient has used [facility] in the past and would like to use them again.  Corene Cornea with [facility] has accepted referral for RN, PT, OT and SW and speech.  Patient does not have a ride home so [facility] will provide transportation home for patient.     Final next level of care: [place] Barriers to Discharge: Barriers Resolved   Patient Goals and CMS Choice Patient states their goals for this hospitalization and ongoing recovery are:: Patient wants to go home, does not want to go for rehab at [facility] CMS Medicare.gov Compare Post Acute Care list provided to:: Patient Choice offered to / list presented to : Patient  Discharge Placement                       Discharge Plan and Services   Discharge Planning Services: CM Consult Post Acute Care Choice: Home Health          DME Arranged: N/A DME Agency: NA       HH Arranged: RN, PT, OT, Social Work [employer] Agency: [place] ([place]) Date [place]: 12/24/20 Time [place]: [id number] Representative spoke with at [place]: [place] ([place]) Interventions     Readmission Risk Interventions Readmission Risk Prevention Plan 12/19/2020  Transportation Screening Complete  PCP or Specialist Appt within 3-5 Days Complete  Social Work Consult for [place] Planning/Counseling [place] Not Applicable  Medication Review Press photographer) Complete  Some recent data might be hidden

## 2021-01-12 ENCOUNTER — Telehealth: Payer: Self-pay

## 2021-01-12 NOTE — Telephone Encounter (Signed)
Letter mailed to pt and detailed message left on voicemail regarding appt with Dr. Henrene Pastor 01/27/21 at 2pm.

## 2021-01-12 NOTE — Telephone Encounter (Signed)
-----   Message from Irene Shipper, MD sent at 01/12/2021 10:45 AM EDT ----- Regarding: Follow-up Thank you Varnita.  Vaughan Basta, Please arrange for this patient to have an office follow-up in a couple of weeks with myself or [facility] practitioner.  Make sure he is on daily PPI and avoiding NSAIDs.  Thanks.  Dr. Henrene Pastor   ----- Message ----- From: Virgel Manifold, MD Sent: 01/12/2021   9:18 AM EDT To: Irene Shipper, MD  Hi Dr. Henrene Pastor,  This pt was recently in the hospital with gastric ulcers and melena. Just wanted to make sure he has follow up with you. Thank you

## 2021-01-13 LAB — BLOOD GAS, VENOUS
Acid-base deficit: 12.1 mmol/L — ABNORMAL HIGH (ref 0.0–2.0)
Bicarbonate: 12.8 mmol/L — ABNORMAL LOW (ref 20.0–28.0)
O2 Saturation: 51.5 %
Patient temperature: 37
pCO2, Ven: 26 mmHg — ABNORMAL LOW (ref 44.0–60.0)
pH, Ven: 7.3 (ref 7.250–7.430)

## 2021-01-19 ENCOUNTER — Emergency Department: Payer: Medicare Other

## 2021-01-19 ENCOUNTER — Encounter: Payer: Self-pay | Admitting: Emergency Medicine

## 2021-01-19 ENCOUNTER — Inpatient Hospital Stay
Admission: EM | Admit: 2021-01-19 | Discharge: 2021-02-02 | DRG: 082 | Disposition: A | Discharge status: Skilled Nursing Facility | Payer: Medicare Other | Attending: Internal Medicine | Admitting: Internal Medicine

## 2021-01-19 ENCOUNTER — Other Ambulatory Visit: Payer: Self-pay

## 2021-01-19 ENCOUNTER — Observation Stay: Payer: Medicare Other

## 2021-01-19 DIAGNOSIS — E876 Hypokalemia: Secondary | ICD-10-CM

## 2021-01-19 DIAGNOSIS — K3189 Other diseases of stomach and duodenum: Secondary | ICD-10-CM | POA: Diagnosis present

## 2021-01-19 DIAGNOSIS — K254 Chronic or unspecified gastric ulcer with hemorrhage: Secondary | ICD-10-CM | POA: Diagnosis present

## 2021-01-19 DIAGNOSIS — E222 Syndrome of inappropriate secretion of antidiuretic hormone: Secondary | ICD-10-CM | POA: Diagnosis present

## 2021-01-19 DIAGNOSIS — Y92009 Unspecified place in unspecified non-institutional (private) residence as the place of occurrence of the external cause: Secondary | ICD-10-CM

## 2021-01-19 DIAGNOSIS — S0181XS Laceration without foreign body of other part of head, sequela: Secondary | ICD-10-CM | POA: Diagnosis not present

## 2021-01-19 DIAGNOSIS — K709 Alcoholic liver disease, unspecified: Secondary | ICD-10-CM | POA: Diagnosis present

## 2021-01-19 DIAGNOSIS — R310 Gross hematuria: Secondary | ICD-10-CM | POA: Diagnosis not present

## 2021-01-19 DIAGNOSIS — S0181XA Laceration without foreign body of other part of head, initial encounter: Secondary | ICD-10-CM | POA: Diagnosis present

## 2021-01-19 DIAGNOSIS — F10929 Alcohol use, unspecified with intoxication, unspecified: Secondary | ICD-10-CM

## 2021-01-19 DIAGNOSIS — G9341 Metabolic encephalopathy: Secondary | ICD-10-CM | POA: Diagnosis present

## 2021-01-19 DIAGNOSIS — W19XXXA Unspecified fall, initial encounter: Secondary | ICD-10-CM

## 2021-01-19 DIAGNOSIS — F10229 Alcohol dependence with intoxication, unspecified: Secondary | ICD-10-CM | POA: Diagnosis present

## 2021-01-19 DIAGNOSIS — F10931 Alcohol use, unspecified with withdrawal delirium: Secondary | ICD-10-CM | POA: Diagnosis present

## 2021-01-19 DIAGNOSIS — R339 Retention of urine, unspecified: Secondary | ICD-10-CM | POA: Diagnosis not present

## 2021-01-19 DIAGNOSIS — R296 Repeated falls: Secondary | ICD-10-CM | POA: Diagnosis present

## 2021-01-19 DIAGNOSIS — I4510 Unspecified right bundle-branch block: Secondary | ICD-10-CM | POA: Diagnosis present

## 2021-01-19 DIAGNOSIS — F1010 Alcohol abuse, uncomplicated: Secondary | ICD-10-CM

## 2021-01-19 DIAGNOSIS — E8729 Other acidosis: Secondary | ICD-10-CM | POA: Diagnosis present

## 2021-01-19 DIAGNOSIS — F10231 Alcohol dependence with withdrawal delirium: Secondary | ICD-10-CM | POA: Diagnosis present

## 2021-01-19 DIAGNOSIS — R0902 Hypoxemia: Secondary | ICD-10-CM | POA: Diagnosis present

## 2021-01-19 DIAGNOSIS — Y908 Blood alcohol level of 240 mg/100 ml or more: Secondary | ICD-10-CM | POA: Diagnosis present

## 2021-01-19 DIAGNOSIS — K2970 Gastritis, unspecified, without bleeding: Secondary | ICD-10-CM | POA: Diagnosis present

## 2021-01-19 DIAGNOSIS — T8383XA Hemorrhage of genitourinary prosthetic devices, implants and grafts, initial encounter: Secondary | ICD-10-CM | POA: Diagnosis not present

## 2021-01-19 DIAGNOSIS — M1990 Unspecified osteoarthritis, unspecified site: Secondary | ICD-10-CM | POA: Diagnosis present

## 2021-01-19 DIAGNOSIS — D61818 Other pancytopenia: Secondary | ICD-10-CM | POA: Diagnosis present

## 2021-01-19 DIAGNOSIS — J449 Chronic obstructive pulmonary disease, unspecified: Secondary | ICD-10-CM | POA: Diagnosis present

## 2021-01-19 DIAGNOSIS — K7030 Alcoholic cirrhosis of liver without ascites: Secondary | ICD-10-CM | POA: Diagnosis present

## 2021-01-19 DIAGNOSIS — N179 Acute kidney failure, unspecified: Secondary | ICD-10-CM | POA: Diagnosis present

## 2021-01-19 DIAGNOSIS — E785 Hyperlipidemia, unspecified: Secondary | ICD-10-CM | POA: Diagnosis present

## 2021-01-19 DIAGNOSIS — R7401 Elevation of levels of liver transaminase levels: Secondary | ICD-10-CM

## 2021-01-19 DIAGNOSIS — D62 Acute posthemorrhagic anemia: Secondary | ICD-10-CM

## 2021-01-19 DIAGNOSIS — S0003XA Contusion of scalp, initial encounter: Secondary | ICD-10-CM | POA: Diagnosis present

## 2021-01-19 DIAGNOSIS — D539 Nutritional anemia, unspecified: Secondary | ICD-10-CM | POA: Diagnosis present

## 2021-01-19 DIAGNOSIS — Z96611 Presence of right artificial shoulder joint: Secondary | ICD-10-CM | POA: Diagnosis present

## 2021-01-19 DIAGNOSIS — I10 Essential (primary) hypertension: Secondary | ICD-10-CM | POA: Diagnosis present

## 2021-01-19 DIAGNOSIS — S065XAA Traumatic subdural hemorrhage with loss of consciousness status unknown, initial encounter: Principal | ICD-10-CM | POA: Diagnosis present

## 2021-01-19 DIAGNOSIS — K766 Portal hypertension: Secondary | ICD-10-CM | POA: Diagnosis present

## 2021-01-19 DIAGNOSIS — K760 Fatty (change of) liver, not elsewhere classified: Secondary | ICD-10-CM | POA: Diagnosis present

## 2021-01-19 DIAGNOSIS — S0101XA Laceration without foreign body of scalp, initial encounter: Secondary | ICD-10-CM | POA: Diagnosis present

## 2021-01-19 DIAGNOSIS — W1811XA Fall from or off toilet without subsequent striking against object, initial encounter: Secondary | ICD-10-CM | POA: Diagnosis present

## 2021-01-19 DIAGNOSIS — F1020 Alcohol dependence, uncomplicated: Secondary | ICD-10-CM | POA: Diagnosis present

## 2021-01-19 DIAGNOSIS — K2211 Ulcer of esophagus with bleeding: Secondary | ICD-10-CM | POA: Diagnosis present

## 2021-01-19 DIAGNOSIS — Y831 Surgical operation with implant of artificial internal device as the cause of abnormal reaction of the patient, or of later complication, without mention of misadventure at the time of the procedure: Secondary | ICD-10-CM | POA: Diagnosis not present

## 2021-01-19 DIAGNOSIS — Z8249 Family history of ischemic heart disease and other diseases of the circulatory system: Secondary | ICD-10-CM

## 2021-01-19 DIAGNOSIS — R338 Other retention of urine: Secondary | ICD-10-CM

## 2021-01-19 DIAGNOSIS — U071 COVID-19: Secondary | ICD-10-CM

## 2021-01-19 DIAGNOSIS — Z23 Encounter for immunization: Secondary | ICD-10-CM

## 2021-01-19 DIAGNOSIS — D689 Coagulation defect, unspecified: Secondary | ICD-10-CM | POA: Diagnosis present

## 2021-01-19 DIAGNOSIS — Z801 Family history of malignant neoplasm of trachea, bronchus and lung: Secondary | ICD-10-CM

## 2021-01-19 DIAGNOSIS — I110 Hypertensive heart disease with heart failure: Secondary | ICD-10-CM | POA: Diagnosis present

## 2021-01-19 DIAGNOSIS — Y846 Urinary catheterization as the cause of abnormal reaction of the patient, or of later complication, without mention of misadventure at the time of the procedure: Secondary | ICD-10-CM | POA: Diagnosis not present

## 2021-01-19 DIAGNOSIS — F17210 Nicotine dependence, cigarettes, uncomplicated: Secondary | ICD-10-CM | POA: Diagnosis present

## 2021-01-19 DIAGNOSIS — D500 Iron deficiency anemia secondary to blood loss (chronic): Secondary | ICD-10-CM

## 2021-01-19 DIAGNOSIS — E860 Dehydration: Secondary | ICD-10-CM | POA: Diagnosis present

## 2021-01-19 DIAGNOSIS — K7010 Alcoholic hepatitis without ascites: Secondary | ICD-10-CM | POA: Diagnosis present

## 2021-01-19 DIAGNOSIS — D696 Thrombocytopenia, unspecified: Secondary | ICD-10-CM

## 2021-01-19 DIAGNOSIS — Z28310 Unvaccinated for covid-19: Secondary | ICD-10-CM

## 2021-01-19 DIAGNOSIS — Z8719 Personal history of other diseases of the digestive system: Secondary | ICD-10-CM

## 2021-01-19 DIAGNOSIS — T8459XA Infection and inflammatory reaction due to other internal joint prosthesis, initial encounter: Secondary | ICD-10-CM | POA: Diagnosis not present

## 2021-01-19 DIAGNOSIS — I5032 Chronic diastolic (congestive) heart failure: Secondary | ICD-10-CM | POA: Diagnosis present

## 2021-01-19 DIAGNOSIS — I959 Hypotension, unspecified: Secondary | ICD-10-CM | POA: Diagnosis present

## 2021-01-19 LAB — CBC WITH DIFFERENTIAL/PLATELET
Abs Immature Granulocytes: 0.04 10*3/uL (ref 0.00–0.07)
Abs Immature Granulocytes: 0.04 10*3/uL (ref 0.00–0.07)
Basophils Absolute: 0 10*3/uL (ref 0.0–0.1)
Basophils Absolute: 0 10*3/uL (ref 0.0–0.1)
Basophils Relative: 1 %
Basophils Relative: 0 %
Eosinophils Absolute: 0 10*3/uL (ref 0.0–0.5)
Eosinophils Absolute: 0 10*3/uL (ref 0.0–0.5)
Eosinophils Relative: 0 %
Eosinophils Relative: 0 %
HCT: 28.1 % — ABNORMAL LOW (ref 39.0–52.0)
HCT: 28.7 % — ABNORMAL LOW (ref 39.0–52.0)
Hemoglobin: 10.5 g/dL — ABNORMAL LOW (ref 13.0–17.0)
Hemoglobin: 10.7 g/dL — ABNORMAL LOW (ref 13.0–17.0)
Immature Granulocytes: 1 %
Immature Granulocytes: 1 %
Lymphocytes Relative: 29 %
Lymphocytes Relative: 30 %
Lymphs Abs: 1.7 10*3/uL (ref 0.7–4.0)
Lymphs Abs: 2.1 10*3/uL (ref 0.7–4.0)
MCH: 39.5 pg — ABNORMAL HIGH (ref 26.0–34.0)
MCH: 39.3 pg — ABNORMAL HIGH (ref 26.0–34.0)
MCHC: 37.4 g/dL — ABNORMAL HIGH (ref 30.0–36.0)
MCHC: 37.3 g/dL — ABNORMAL HIGH (ref 30.0–36.0)
MCV: 105.6 fL — ABNORMAL HIGH (ref 80.0–100.0)
MCV: 105.5 fL — ABNORMAL HIGH (ref 80.0–100.0)
Monocytes Absolute: 0.7 10*3/uL (ref 0.1–1.0)
Monocytes Absolute: 0.7 10*3/uL (ref 0.1–1.0)
Monocytes Relative: 12 %
Monocytes Relative: 11 %
Neutro Abs: 3.4 10*3/uL (ref 1.7–7.7)
Neutro Abs: 4 10*3/uL (ref 1.7–7.7)
Neutrophils Relative %: 57 %
Neutrophils Relative %: 58 %
Platelets: 51 10*3/uL — ABNORMAL LOW (ref 150–400)
Platelets: 53 10*3/uL — ABNORMAL LOW (ref 150–400)
RBC: 2.66 MIL/uL — ABNORMAL LOW (ref 4.22–5.81)
RBC: 2.72 MIL/uL — ABNORMAL LOW (ref 4.22–5.81)
RDW: 18.9 % — ABNORMAL HIGH (ref 11.5–15.5)
RDW: 18.8 % — ABNORMAL HIGH (ref 11.5–15.5)
WBC: 5.8 10*3/uL (ref 4.0–10.5)
WBC: 6.9 10*3/uL (ref 4.0–10.5)
nRBC: 0 % (ref 0.0–0.2)
nRBC: 0 % (ref 0.0–0.2)

## 2021-01-19 LAB — BASIC METABOLIC PANEL
Anion gap: 18 — ABNORMAL HIGH (ref 5–15)
BUN: 10 mg/dL (ref 8–23)
CO2: 21 mmol/L — ABNORMAL LOW (ref 22–32)
Calcium: 7.6 mg/dL — ABNORMAL LOW (ref 8.9–10.3)
Chloride: 87 mmol/L — ABNORMAL LOW (ref 98–111)
Creatinine, Ser: 0.8 mg/dL (ref 0.61–1.24)
GFR, Estimated: >60 mL/min (ref >60)
Glucose, Bld: 101 mg/dL — ABNORMAL HIGH (ref 70–99)
Potassium: 2.7 mmol/L — CL (ref 3.5–5.1)
Sodium: 126 mmol/L — ABNORMAL LOW (ref 135–145)

## 2021-01-19 LAB — COMPREHENSIVE METABOLIC PANEL
ALT: 39 U/L (ref 0–44)
AST: 95 U/L — ABNORMAL HIGH (ref 15–41)
Albumin: 3.1 g/dL — ABNORMAL LOW (ref 3.5–5.0)
Alkaline Phosphatase: 129 U/L — ABNORMAL HIGH (ref 38–126)
Anion gap: 16 — ABNORMAL HIGH (ref 5–15)
BUN: 10 mg/dL (ref 8–23)
CO2: 22 mmol/L (ref 22–32)
Calcium: 7.5 mg/dL — ABNORMAL LOW (ref 8.9–10.3)
Chloride: 87 mmol/L — ABNORMAL LOW (ref 98–111)
Creatinine, Ser: 0.69 mg/dL (ref 0.61–1.24)
GFR, Estimated: >60 mL/min (ref >60)
Glucose, Bld: 100 mg/dL — ABNORMAL HIGH (ref 70–99)
Potassium: 2.7 mmol/L — CL (ref 3.5–5.1)
Sodium: 125 mmol/L — ABNORMAL LOW (ref 135–145)
Total Bilirubin: 1.7 mg/dL — ABNORMAL HIGH (ref 0.3–1.2)
Total Protein: 6.3 g/dL — ABNORMAL LOW (ref 6.5–8.1)

## 2021-01-19 LAB — ETHANOL: Alcohol, Ethyl (B): 340 mg/dL (ref <10)

## 2021-01-19 LAB — MAGNESIUM
Magnesium: 1.1 mg/dL — ABNORMAL LOW (ref 1.7–2.4)
Magnesium: 1.8 mg/dL (ref 1.7–2.4)

## 2021-01-19 LAB — RESP PANEL BY RT-PCR (FLU A&B, COVID) ARPGX2
Influenza A by PCR: NEGATIVE
Influenza B by PCR: NEGATIVE
SARS Coronavirus 2 by RT PCR: NEGATIVE

## 2021-01-19 LAB — POTASSIUM: Potassium: 3.4 mmol/L — ABNORMAL LOW (ref 3.5–5.1)

## 2021-01-19 LAB — PHOSPHORUS: Phosphorus: 2.5 mg/dL (ref 2.5–4.6)

## 2021-01-19 LAB — HEMOGLOBIN: Hemoglobin: 9.1 g/dL — ABNORMAL LOW (ref 13.0–17.0)

## 2021-01-19 MED ORDER — KCL IN DEXTROSE-NACL 20-5-0.9 MEQ/L-%-% IV SOLN
INTRAVENOUS | Status: DC
  Filled 2021-01-19 (×4): qty 1000

## 2021-01-19 MED ORDER — FOLIC ACID 1 MG PO TABS
1.0000 mg | ORAL_TABLET | Freq: Every day | ORAL | Status: DC
  Administered 2021-01-19 – 2021-02-02 (×13): 1 mg via ORAL
  Filled 2021-01-19 (×13): qty 1

## 2021-01-19 MED ORDER — HYDROCODONE-ACETAMINOPHEN 5-325 MG PO TABS
1.0000 | ORAL_TABLET | ORAL | Status: DC | PRN
  Administered 2021-01-19: 17:00:00 1 via ORAL
  Administered 2021-01-20 (×2): 2 via ORAL
  Administered 2021-01-20: 09:00:00 1 via ORAL
  Administered 2021-01-21 (×2): 2 via ORAL
  Filled 2021-01-19 (×3): qty 2
  Filled 2021-01-19 (×2): qty 1
  Filled 2021-01-19: qty 2

## 2021-01-19 MED ORDER — PANTOPRAZOLE INFUSION (NEW) - SIMPLE MED
8.0000 mg/h | INTRAVENOUS | Status: DC
  Filled 2021-01-19 (×2): qty 100

## 2021-01-19 MED ORDER — PANTOPRAZOLE SODIUM 40 MG IV SOLR: 40.0000 mg | Freq: Two times a day (BID) | INTRAVENOUS | Status: DC

## 2021-01-19 MED ORDER — BUDESONIDE 0.5 MG/2ML IN SUSP
0.5000 mg | Freq: Two times a day (BID) | RESPIRATORY_TRACT | Status: DC
  Administered 2021-01-19 – 2021-01-21 (×5): 0.5 mg via RESPIRATORY_TRACT
  Filled 2021-01-19 (×5): qty 2

## 2021-01-19 MED ORDER — LACTATED RINGERS IV BOLUS
1000.0000 mL | Freq: Once | INTRAVENOUS | Status: AC
  Administered 2021-01-19: 1000 mL via INTRAVENOUS

## 2021-01-19 MED ORDER — LORAZEPAM 2 MG/ML IJ SOLN: 0.0000 mg | Freq: Two times a day (BID) | INTRAMUSCULAR | Status: DC

## 2021-01-19 MED ORDER — POTASSIUM CHLORIDE CRYS ER 20 MEQ PO TBCR
40.0000 meq | EXTENDED_RELEASE_TABLET | Freq: Once | ORAL | Status: AC
  Administered 2021-01-19: 40 meq via ORAL
  Filled 2021-01-19: qty 2

## 2021-01-19 MED ORDER — DEXTROSE-NACL 5-0.9 % IV SOLN: INTRAVENOUS | Status: DC

## 2021-01-19 MED ORDER — MAGNESIUM SULFATE 2 GM/50ML IV SOLN
2.0000 g | Freq: Once | INTRAVENOUS | Status: AC
  Administered 2021-01-19: 2 g via INTRAVENOUS
  Filled 2021-01-19: qty 50

## 2021-01-19 MED ORDER — ONDANSETRON HCL 4 MG/2ML IJ SOLN: 4.0000 mg | Freq: Four times a day (QID) | INTRAMUSCULAR | Status: DC | PRN

## 2021-01-19 MED ORDER — TETANUS-DIPHTH-ACELL PERTUSSIS 5-2.5-18.5 LF-MCG/0.5 IM SUSY
0.5000 mL | PREFILLED_SYRINGE | Freq: Once | INTRAMUSCULAR | Status: AC
  Administered 2021-01-19: 0.5 mL via INTRAMUSCULAR
  Filled 2021-01-19: qty 0.5

## 2021-01-19 MED ORDER — CEPHALEXIN 500 MG PO CAPS
500.0000 mg | ORAL_CAPSULE | Freq: Three times a day (TID) | ORAL | Status: AC
  Administered 2021-01-19 – 2021-01-23 (×13): 500 mg via ORAL
  Filled 2021-01-19 (×15): qty 1

## 2021-01-19 MED ORDER — ADULT MULTIVITAMIN W/MINERALS CH
1.0000 | ORAL_TABLET | Freq: Every day | ORAL | Status: DC
  Administered 2021-01-19 – 2021-02-02 (×12): 1 via ORAL
  Filled 2021-01-19 (×12): qty 1

## 2021-01-19 MED ORDER — PANTOPRAZOLE 80MG IVPB - SIMPLE MED
80.0000 mg | Freq: Once | INTRAVENOUS | Status: AC
  Administered 2021-01-19: 80 mg via INTRAVENOUS
  Filled 2021-01-19: qty 80

## 2021-01-19 MED ORDER — POTASSIUM CHLORIDE 10 MEQ/100ML IV SOLN
10.0000 meq | INTRAVENOUS | Status: AC
Start: 2021-01-19 — End: 2021-01-19
  Administered 2021-01-19 (×5): 10 meq via INTRAVENOUS
  Filled 2021-01-19 (×5): qty 100

## 2021-01-19 MED ORDER — LORAZEPAM 2 MG/ML IJ SOLN
0.0000 mg | Freq: Four times a day (QID) | INTRAMUSCULAR | Status: AC
  Administered 2021-01-19: 21:00:00 1 mg via INTRAVENOUS
  Filled 2021-01-19: qty 1

## 2021-01-19 MED ORDER — LOSARTAN POTASSIUM 25 MG PO TABS
12.5000 mg | ORAL_TABLET | Freq: Every day | ORAL | Status: DC
  Filled 2021-01-19: qty 0.5

## 2021-01-19 MED ORDER — ACETAMINOPHEN 325 MG PO TABS: 650.0000 mg | ORAL_TABLET | Freq: Four times a day (QID) | ORAL | Status: DC | PRN

## 2021-01-19 MED ORDER — LORAZEPAM 2 MG PO TABS
0.0000 mg | ORAL_TABLET | Freq: Two times a day (BID) | ORAL | Status: DC
  Administered 2021-01-21: 2 mg via ORAL
  Filled 2021-01-19: qty 1

## 2021-01-19 MED ORDER — THIAMINE HCL 100 MG/ML IJ SOLN
100.0000 mg | Freq: Every day | INTRAMUSCULAR | Status: DC
Start: 2021-01-19 — End: 2021-01-27
  Administered 2021-01-19 – 2021-01-26 (×3): 100 mg via INTRAVENOUS
  Filled 2021-01-19 (×4): qty 2

## 2021-01-19 MED ORDER — ALLOPURINOL 100 MG PO TABS
100.0000 mg | ORAL_TABLET | Freq: Every day | ORAL | Status: DC
  Administered 2021-01-19 – 2021-02-02 (×13): 100 mg via ORAL
  Filled 2021-01-19 (×14): qty 1

## 2021-01-19 MED ORDER — ACETAMINOPHEN 650 MG RE SUPP
650.0000 mg | Freq: Four times a day (QID) | RECTAL | Status: DC | PRN
  Filled 2021-01-19: qty 1

## 2021-01-19 MED ORDER — ONDANSETRON HCL 4 MG PO TABS: 4.0000 mg | ORAL_TABLET | Freq: Four times a day (QID) | ORAL | Status: DC | PRN

## 2021-01-19 MED ORDER — THIAMINE HCL 100 MG PO TABS
100.0000 mg | ORAL_TABLET | Freq: Every day | ORAL | Status: DC
  Administered 2021-01-20 – 2021-02-02 (×12): 100 mg via ORAL
  Filled 2021-01-19 (×13): qty 1

## 2021-01-19 MED ORDER — PANTOPRAZOLE SODIUM 40 MG IV SOLR
40.0000 mg | Freq: Two times a day (BID) | INTRAVENOUS | Status: DC
  Administered 2021-01-20 – 2021-01-27 (×15): 40 mg via INTRAVENOUS
  Filled 2021-01-19 (×13): qty 40

## 2021-01-19 MED ORDER — ACETAMINOPHEN 325 MG PO TABS
650.0000 mg | ORAL_TABLET | Freq: Once | ORAL | Status: DC
  Filled 2021-01-19: qty 2

## 2021-01-19 MED ORDER — IPRATROPIUM-ALBUTEROL 0.5-2.5 (3) MG/3ML IN SOLN
3.0000 mL | Freq: Four times a day (QID) | RESPIRATORY_TRACT | Status: DC
  Administered 2021-01-19 – 2021-01-20 (×3): 3 mL via RESPIRATORY_TRACT
  Filled 2021-01-19 (×4): qty 3

## 2021-01-19 MED ORDER — SODIUM CHLORIDE 0.9% IV SOLUTION
Freq: Once | INTRAVENOUS | Status: AC
  Filled 2021-01-19: qty 250

## 2021-01-19 MED ORDER — LORAZEPAM 2 MG PO TABS
0.0000 mg | ORAL_TABLET | Freq: Four times a day (QID) | ORAL | Status: AC
  Administered 2021-01-19 – 2021-01-21 (×5): 1 mg via ORAL
  Filled 2021-01-19 (×5): qty 1

## 2021-01-19 MED ORDER — LIDOCAINE-EPINEPHRINE-TETRACAINE (LET) TOPICAL GEL
3.0000 mL | Freq: Once | TOPICAL | Status: AC
  Administered 2021-01-19: 3 mL via TOPICAL
  Filled 2021-01-19: qty 3

## 2021-01-19 MED ORDER — LIDOCAINE HCL (PF) 1 % IJ SOLN
5.0000 mL | Freq: Once | INTRAMUSCULAR | Status: AC
  Administered 2021-01-19: 5 mL via INTRADERMAL
  Filled 2021-01-19: qty 5

## 2021-01-19 NOTE — ED Notes (Signed)
CT called for ETA on when pt will get repeat head CT, MD requested this get done ASAP.  Pt placed on bedpan for BM, pt taken off bed pan and cleaned up, pt denies further needs. Call bell in reach.

## 2021-01-19 NOTE — Progress Notes (Signed)
Patient ID: Fernando [name], male   DOB: 1956/01/01, 65 y.o.   MRN: [id number] [facility] PROGRESS NOTE  Fernando [name] RWE:[id number] DOB: [birth year] DOA: 01/19/2021 PCP: [name], MD  HPI/Subjective: Patient states that he has been having difficulty with balance.  He states that he tried to get up from the commode and ended up on the ground.  He crawled to get to his phone and called for help.  Had a large laceration on his forehead that needed sutures.  Found to have a subdural hematoma on CT scan.  States he can move all his extremities okay.  No headache or blurry vision.  Does not take any blood thinners at home.  Objective: Vitals:   01/19/21 0900 01/19/21 1000  BP: 136/78 121/61  Pulse: (!) 104 77  Resp: (!) 24 (!) 22  Temp:    SpO2: 97% 97%    Intake/Output Summary (Last 24 hours) at 01/19/2021 1224 Last data filed at 01/19/2021 1005 Gross per 24 hour  Intake --  Output 1000 ml  Net -1000 ml   Filed Weights   01/19/21 0037  Weight: 68 kg    ROS: Review of Systems  Respiratory:  Negative for shortness of breath.   Cardiovascular:  Negative for chest pain.  Gastrointestinal:  Negative for abdominal pain, nausea and vomiting.  Exam: Physical Exam HENT:     Head: Normocephalic.     Mouth/Throat:     Pharynx: No oropharyngeal exudate.  Eyes:     General: Lids are normal.     Conjunctiva/sclera: Conjunctivae normal.  Cardiovascular:     Rate and Rhythm: Normal rate and regular rhythm.     Heart sounds: Normal heart sounds, S1 normal and S2 normal.  Pulmonary:     Breath sounds: Examination of the right-middle field reveals wheezing. Examination of the left-middle field reveals wheezing. Examination of the right-lower field reveals decreased breath sounds and wheezing. Examination of the left-lower field reveals decreased breath sounds and wheezing. Decreased breath sounds and wheezing present. No rhonchi or rales.  Abdominal:     Palpations: Abdomen is  soft.     Tenderness: There is no abdominal tenderness.  Musculoskeletal:     Right lower leg: No swelling.     Left lower leg: No swelling.  Skin:    General: Skin is warm.     Findings: No rash.     Comments: Bruising on arms, sutures and dried blood on forehead.  Neurological:     Mental Status: He is alert and oriented to person, place, and time.     Comments: Able to straight leg raise bilaterally.      Scheduled Meds:  allopurinol  100 mg Oral Daily   budesonide (PULMICORT) nebulizer solution  0.5 mg Nebulization BID   folic acid  1 mg Oral Daily   ipratropium-albuterol  3 mL Nebulization Q6H   LORazepam  0-4 mg Intravenous Q6H   Or   LORazepam  0-4 mg Oral Q6H   [START ON 01/21/2021] LORazepam  0-4 mg Intravenous Q12H   Or   [START ON 01/21/2021] LORazepam  0-4 mg Oral Q12H   losartan  12.5 mg Oral Daily   multivitamin with minerals  1 tablet Oral Daily   thiamine  100 mg Oral Daily   Or   thiamine  100 mg Intravenous Daily   Continuous Infusions:  dextrose 5 % and 0.9 % NaCl with KCl 20 mEq/L 75 mL/hr at 01/19/21 1122    Assessment/Plan:  Severe hypomagnesemia with a magnesium of 1.1.  Replace magnesium again today and recheck tomorrow. Severe hypokalemia did not change much.  Patient on rounds of potassium we will add potassium to IV fluids. Subdural hematoma from fall.  If platelet counts drop any lower may end up needing a platelet transfusion Laceration forehead.  Empiric Keflex.  Sutured in the ER.  Will need sutures removed in 7 days. Alcohol abuse, thrombocytopenia, elevated AST.  Alcohol withdrawal protocol.  Continue thiamine folic acid and multivitamin.  D5 added to the IV fluids. Essential hypertension.  Restart losartan with electrolyte abnormalities.  Check orthostatic vitals. PT and OT evaluations Chronic diastolic congestive heart failure.  No signs of heart failure watch closely with IV fluids. COPD with wheeze start nebulizer treatments.         Code Status:     Code Status Orders  (From admission, onward)           Start     Ordered   01/19/21 0442  Full code  Continuous        01/19/21 0443           Code Status History     Date Active Date Inactive Code Status Order ID Comments User Context   12/18/2020 1238 12/24/2020 1914 Full Code [id number]  Collier Bullock, MD ED   08/16/2020 2030 08/29/2020 1859 Full Code [id number]  Lafayette Dragon, MD ED   01/26/2020 0918 02/05/2020 2001 Full Code [id number]  Karmen Bongo, MD ED   10/25/2019 1243 11/01/2019 2239 Full Code [id number]  Mckinley Jewel, MD ED   06/21/2018 1248 06/22/2018 1327 Full Code [id number]  Hiram Gash, MD Inpatient   02/04/2018 1608 02/05/2018 2008 Full Code [id number]  Hillary Bow, MD ED      Advance Directive Documentation    Flowsheet Row Most Recent Value  Type of Advance Directive Living will  Pre-existing out of facility DNR order (yellow form or pink MOST form) --  "MOST" Form in Place? --      Disposition Plan: Status is: Observation.  We will need to replace electrolytes aggressively today and recheck tomorrow.  PT and OT evaluations.  Check orthostatic vital signs.  Need to assess his safety at home by himself.  Consultants: Neurosurgery  Time spent: 32 minutes  [place]

## 2021-01-19 NOTE — ED Notes (Signed)
Informed RN bed assigned 

## 2021-01-19 NOTE — ED Provider Notes (Addendum)
[facility] Emergency Department Provider Note  ____________________________________________   Event Date/Time   First MD Initiated Contact with Patient 01/19/21 0210     (approximate)  I have reviewed the triage vital signs and the nursing notes.   HISTORY  Chief Complaint Fall  Level 5 caveat:  history/ROS limited by acute/critical illness  HPI Fernando [name] is a 65 y.o. male with history of chronic alcohol abuse who presents by EMS for evaluation after a fall at home.  The patient says he remembers falling but does not remember exactly the circumstances.  With some reluctance, he admits to heavy alcohol use on a daily basis.  He said that he does not eat very much food.  He states that he has "a balance issue" that caused him to fall in the bathroom.  He was able to call 911.  He has a hematoma and laceration to his forehead.    Of note, while he was in the waiting room, he reportedly managed to walk to a bathroom but then had another fall in the emergency department bathroom.  He sustained another injury to his forehead and by that point a room was available and he was brought immediately back.  See hospital course for details.  During my assessment the patient says he feels fine.  He denies even having a headache.  He denies neck pain.  He said he has no chest pain, shortness of breath, nausea, nor vomiting.  He claims he has never had hallucinations nor seizures when he stops drinking.  He has no numbness nor weakness in any of his extremities.  Onset of the events tonight was acute and severe and nothing in particular made it better or worse.     Past Medical History:  Diagnosis Date   Alcohol dependence ([place])    Arthritis    Cataract    Chronic systolic (congestive) heart failure (HCC)    EF 25-30% in 10/2019   Dyslipidemia    Essential hypertension    History of kidney stones    Hyperlipidemia    Hypertension    Pneumonia    Prosthetic  shoulder infection, initial encounter [facility])     Patient Active Problem List   Diagnosis Date Noted   Hypokalemia 01/19/2021   Hypomagnesemia 01/19/2021   Laceration of forehead without complication 46/50/3546   Acute subdural hematoma 01/19/2021   Hematoma of frontal scalp 01/19/2021   History of GI bleed 01/19/2021   Malnutrition of moderate degree 56/81/2751   Alcoholic ketoacidosis 70/04/7492   Chronic diastolic CHF (congestive heart failure) ([place]) 49/67/5916   Alcoholic cirrhosis of liver without ascites ([place])    Acute GI bleeding 08/16/2020   Abnormal CT of the abdomen 05/16/2020   Pancreatic disease 05/16/2020   Cyst of pancreas 05/16/2020   Acute on chronic combined systolic and diastolic CHF (congestive heart failure) ([place]) 02/05/2020   Anemia of chronic disease    Alcoholic liver disease ([place])    Portal hypertensive gastropathy (HCC)    Pancreatic mass    Sinus tachycardia    AKI (acute kidney injury) ([place]) 01/27/2020   Fall at home, initial encounter 01/26/2020   Hypotension 01/26/2020   Tobacco dependence 01/26/2020   Dislocation of prosthetic shoulder joint, initial encounter [facility])    Essential hypertension    Dyslipidemia    Alcohol use disorder, moderate, dependence (HCC)    Pneumonitis    Generalized weakness 38/46/6599   Alcoholic hepatitis 35/70/1779   Sinus tachycardia 10/25/2019  Hyperammonemia ([place]) 10/25/2019   Alcohol withdrawal ([place]) 10/25/2019   Acute hypoxemic respiratory failure ([place]) 10/25/2019   Macrocytic anemia 10/25/2019   Thrombocytopenia ([place]) 10/25/2019   Gout    Tobacco abuse    Stasis dermatitis of both legs 09/04/2018   Medication monitoring encounter 08/09/2018   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 ENDOSCOPY;   Service: Gastroenterology;;   BIOPSY  08/17/2020   Procedure: BIOPSY;  Surgeon: Carol Ada, MD;  Location: [facility] ENDOSCOPY;  Service: Endoscopy;;   COLONOSCOPY     COLONOSCOPY     multiple   ESOPHAGOGASTRODUODENOSCOPY N/A 12/20/2020   Procedure: ESOPHAGOGASTRODUODENOSCOPY (EGD);  Surgeon: Virgel Manifold, MD;  Location: [facility] ENDOSCOPY;  Service: Endoscopy;  Laterality: N/A;   ESOPHAGOGASTRODUODENOSCOPY (EGD) WITH PROPOFOL N/A 01/31/2020   Procedure: ESOPHAGOGASTRODUODENOSCOPY (EGD) WITH PROPOFOL;  Surgeon: Doran Stabler, MD;  Location: [place];  Service: Gastroenterology;  Laterality: N/A;   ESOPHAGOGASTRODUODENOSCOPY (EGD) WITH PROPOFOL N/A 04/21/2020   Procedure: ESOPHAGOGASTRODUODENOSCOPY (EGD) WITH PROPOFOL;  Surgeon: Rush Landmark Telford Nab., MD;  Location: [place];  Service: Gastroenterology;  Laterality: N/A;   ESOPHAGOGASTRODUODENOSCOPY (EGD) WITH PROPOFOL N/A 08/17/2020   Procedure: ESOPHAGOGASTRODUODENOSCOPY (EGD) WITH PROPOFOL;  Surgeon: Carol Ada, MD;  Location: [place];  Service: Endoscopy;  Laterality: N/A;   EUS N/A 04/21/2020   Procedure: UPPER ENDOSCOPIC ULTRASOUND (EUS) LINEAR;  Surgeon: Irving Copas., MD;  Location: [place];  Service: Gastroenterology;  Laterality: N/A;   [place]   left men. repair   KNEE SURGERY  1975   REVERSE SHOULDER ARTHROPLASTY Right 06/21/2018   Procedure: REVERSE SHOULDER ARTHROPLASTY;  Surgeon: Hiram Gash, MD;  Location: [facility]S;  Service: Orthopedics;  Laterality: Right;   ROTATOR CUFF REPAIR     right   ROTATOR CUFF REPAIR     TONSILLECTOMY     TOTAL SHOULDER REPLACEMENT  7622   UMBILICAL HERNIA REPAIR      Prior to Admission medications   Medication Sig Start Date End Date Taking? Authorizing Provider  albuterol (PROVENTIL) (2.5 MG/3ML) 0.083% nebulizer solution Take 3 mLs (2.5 mg total) by nebulization every 6 (six) hours as needed for wheezing. Patient not taking:  Reported on 12/18/2020 02/05/20   Debbe Odea, MD  allopurinol (ZYLOPRIM) 100 MG tablet Take 100 mg by mouth daily. 08/09/18   [provider]  arformoterol (BROVANA) 15 MCG/2ML NEBU Take 2 mLs (15 mcg total) by nebulization 2 (two) times daily. Patient not taking: Reported on 12/18/2020 08/29/20   Patrecia Pour, MD  budesonide (PULMICORT) 0.25 MG/2ML nebulizer solution Take 2 mLs (0.25 mg total) by nebulization 2 (two) times daily. Patient not taking: Reported on 12/18/2020 08/29/20   Patrecia Pour, MD  carvedilol (COREG) 3.125 MG tablet Take 1 tablet (3.125 mg total) by mouth 2 (two) times daily. Do not take is Systolic BP is <633 3/54/56   Pokhrel, Corrie Mckusick, MD  docusate sodium (COLACE) 100 MG capsule Take 1 capsule (100 mg total) by mouth 2 (two) times daily as needed for mild constipation. 08/25/20   Pokhrel, Corrie Mckusick, MD  famotidine (PEPCID) 40 MG tablet Take 1 tablet by mouth at bedtime. Patient not taking: Reported on 12/18/2020    [provider]  folic acid (FOLVITE) 1 MG tablet  Take 1 mg by mouth daily. 12/03/19   [provider]  furosemide (LASIX) 40 MG tablet Take 1 tablet (40 mg total) by mouth daily. 02/05/20 02/04/21  Debbe Odea, MD  guaiFENesin-dextromethorphan (ROBITUSSIN DM) 100-10 MG/5ML syrup Take 10 mLs by mouth every 6 (six) hours as needed for cough. Patient not taking: Reported on 12/18/2020 08/25/20   Flora Lipps, MD  losartan (COZAAR) 25 MG tablet Take 0.5 tablets (12.5 mg total) by mouth daily. DO not take if systolic blood pressure <295 08/25/20   Pokhrel, [place], MD  Multiple Vitamin (MULTIVITAMIN) tablet Take 1 tablet by mouth daily.  Patient not taking: Reported on 12/18/2020    [provider]  nicotine (NICODERM CQ - DOSED IN MG/24 HOURS) 14 mg/24hr patch Place 1 patch (14 mg total) onto the skin daily. Patient not taking: Reported on 12/18/2020 08/26/20   Flora Lipps, MD  pantoprazole (PROTONIX) 40 MG tablet Take 1 tablet (40 mg total)  by mouth 2 (two) times daily for 14 days, THEN 1 tablet (40 mg total) daily. 12/24/20 03/18/21  Pokhrel, Corrie Mckusick, MD  revefenacin (YUPELRI) 175 MCG/3ML nebulizer solution Take 3 mLs (175 mcg total) by nebulization daily. Patient not taking: Reported on 12/18/2020 08/30/20   Patrecia Pour, MD  thiamine 100 MG tablet Take 1 tablet (100 mg total) by mouth daily. Patient not taking: Reported on 12/18/2020 08/26/20   Flora Lipps, MD    Allergies Atorvastatin and Doxycycline  Family History  Problem Relation Age of Onset   Heart failure Mother    Lung cancer Father    Colon cancer Neg Hx    Esophageal cancer Neg Hx    Stomach cancer Neg Hx    Liver cancer Neg Hx    Pancreatic cancer Neg Hx    Rectal cancer Neg Hx    Inflammatory bowel disease Neg Hx     Social History Social History   Tobacco Use   Smoking status: Every Day    Packs/day: 0.50    Years: 50.00    Pack years: 25.00    Types: Cigarettes   Smokeless tobacco: Never  Vaping Use   Vaping Use: Never used  Substance Use Topics   Alcohol use: Yes    Alcohol/week: 14.0 standard drinks    Types: 14 Shots of liquor per week   Drug use: Not Currently    Types: Marijuana    Review of Systems Level 5 caveat:  history/ROS limited by acute intoxication  ____________________________________________   PHYSICAL EXAM:  VITAL SIGNS: ED Triage Vitals  Enc Vitals Group     BP 01/19/21 0033 103/66     Pulse Rate 01/19/21 0033 99     Resp 01/19/21 0033 18     Temp 01/19/21 0033 97.9 F (36.6 C)     Temp Source 01/19/21 0033 Oral     SpO2 01/19/21 0033 90 %     Weight 01/19/21 0037 68 kg (150 lb)     Height 01/19/21 0037 1.651 m (5\' 5" )     Head Circumference --      Peak Flow --      Pain Score 01/19/21 0037 0     Pain Loc --      Pain Edu? --      Excl. in [place]? --     Constitutional: Alert and oriented.  Eyes: Conjunctivae are normal.  Head: 2 separate contusions on the patient's forehead, 1 with a large hematoma  and stellate laceration, the other 1 without  a hematoma but with something of a H-shaped laceration on the left side of his forehead.  No other traumatic injuries are noted including on his occiput. Nose: No congestion/rhinnorhea.  No epistaxis. Mouth/Throat: Patient is wearing a mask. Neck: No stridor.  No meningeal signs.  No tenderness to palpation of the C-spine and no pain or tenderness when rotating his head side to side nor flexing and extending his head and neck. Cardiovascular: Normal rate, regular rhythm. Good peripheral circulation. Respiratory: Normal respiratory effort.  No retractions. Gastrointestinal: Soft and nontender. No distention.  Musculoskeletal: No lower extremity tenderness nor edema. No gross deformities of extremities. Neurologic: Slightly slurred speech and language. No gross focal neurologic deficits are appreciated.  Good grip strength in bilateral muscle groups strength. Skin:  Skin is warm, dry and intact.   ____________________________________________   LABS (all labs ordered are listed, but only abnormal results are displayed)  Labs Reviewed  CBC WITH DIFFERENTIAL/PLATELET - Abnormal; Notable for the following components:      Result Value   RBC 2.66 (*)    Hemoglobin 10.5 (*)    HCT 28.1 (*)    MCV 105.6 (*)    MCH 39.5 (*)    MCHC 37.4 (*)    RDW 18.9 (*)    Platelets 51 (*)    All other components within normal limits  COMPREHENSIVE METABOLIC PANEL - Abnormal; Notable for the following components:   Sodium 125 (*)    Potassium 2.7 (*)    Chloride 87 (*)    Glucose, Bld 100 (*)    Calcium 7.5 (*)    Total Protein 6.3 (*)    Albumin 3.1 (*)    AST 95 (*)    Alkaline Phosphatase 129 (*)    Total Bilirubin 1.7 (*)    Anion gap 16 (*)    All other components within normal limits  MAGNESIUM - Abnormal; Notable for the following components:   Magnesium 1.1 (*)    All other components within normal limits  CBC WITH DIFFERENTIAL/PLATELET -  Abnormal; Notable for the following components:   RBC 2.72 (*)    Hemoglobin 10.7 (*)    HCT 28.7 (*)    MCV 105.5 (*)    MCH 39.3 (*)    MCHC 37.3 (*)    RDW 18.8 (*)    Platelets 53 (*)    All other components within normal limits  BASIC METABOLIC PANEL - Abnormal; Notable for the following components:   Sodium 126 (*)    Potassium 2.7 (*)    Chloride 87 (*)    CO2 21 (*)    Glucose, Bld 101 (*)    Calcium 7.6 (*)    Anion gap 18 (*)    All other components within normal limits  ETHANOL - Abnormal; Notable for the following components:   Alcohol, Ethyl (B) 340 (*)    All other components within normal limits  RESP PANEL BY RT-PCR (FLU A&B, COVID) ARPGX2  PHOSPHORUS   ____________________________________________  EKG  ED ECG REPORT I, Hinda Kehr, the attending physician, personally viewed and interpreted this ECG.  Date: 01/19/2021 EKG Time: 00: 45 Rate: 89 Rhythm: normal sinus rhythm QRS Axis: normal Intervals: Right bundle branch block ST/T Wave abnormalities: Non-specific ST segment / T-wave changes, but no clear evidence of acute ischemia. Narrative Interpretation: no definitive evidence of acute ischemia; does not meet STEMI criteria.  ED ECG REPORT I, Hinda Kehr, the attending physician, personally viewed and interpreted this ECG.  Date: 01/19/2021 EKG Time: 4:38 AM Rate: 97 Rhythm: normal sinus rhythm QRS Axis: normal Intervals: Right bundle branch block ST/T Wave abnormalities: Non-specific ST segment / T-wave changes, but no clear evidence of acute ischemia. Narrative Interpretation: no definitive evidence of acute ischemia; does not meet STEMI criteria.   ____________________________________________  RADIOLOGY I, Hinda Kehr, personally viewed and evaluated these images (plain radiographs) as part of my medical decision making, as well as reviewing the written report by the radiologist.  I also discussed the case by phone with Dr. Collins [place] with  radiology.  ED MD interpretation: No acute abnormality on chest x-ray.  First CT scan of the head and C-spine were normal.  Second CT C-spine is normal, but the CT head demonstrates a small subdural hematoma.  Official radiology report(s): DG Chest 2 View  Result Date: 01/19/2021 CLINICAL DATA:  Hypoxia following fall, initial encounter EXAM: CHEST - 2 VIEW COMPARISON:  12/21/2020 FINDINGS: Cardiac shadow is stable. Lungs are well aerated bilaterally. Minimal scarring is seen in the bases improved from the prior exam. Postsurgical changes in right shoulder are noted. No acute bony abnormality is seen. IMPRESSION: No acute abnormality noted. Electronically Signed   By: Inez Catalina M.D.   On: 01/19/2021 01:13   CT Head Wo Contrast  Result Date: 01/19/2021 CLINICAL DATA:  Fall EXAM: CT HEAD WITHOUT CONTRAST TECHNIQUE: Contiguous axial images were obtained from the base of the skull through the vertex without intravenous contrast. COMPARISON:  None. FINDINGS: Brain: There is now an acute subdural hematoma along the anterior falx cerebri, measuring 4 mm in thickness. Component of hematoma over the right convexity measures 5 mm. Vascular: Atherosclerotic calcification of the vertebral and internal carotid arteries at the skull base. No abnormal hyperdensity of the major intracranial arteries or dural venous sinuses. Skull: Right frontal scalp hematoma.  No skull fracture. Sinuses/Orbits: Fluid in the right maxillary sinus. The orbits are normal. IMPRESSION: 1. Acute subdural hematoma along the anterior falx cerebri, measuring 4 mm in thickness. Component of hematoma over the right convexity measures 5 mm. 2. Right frontal scalp hematoma without skull fracture. Critical Value/emergent results were called by telephone at the time of interpretation on 01/19/2021 at 3:10 am to provider Hinda Kehr, who verbally acknowledged these results. Electronically Signed   By: Ulyses Jarred M.D.   On: 01/19/2021 03:10    CT Head Wo Contrast  Result Date: 01/19/2021 CLINICAL DATA:  Recent fall in the bathroom with forehead laceration, initial encounter EXAM: CT HEAD WITHOUT CONTRAST TECHNIQUE: Contiguous axial images were obtained from the base of the skull through the vertex without intravenous contrast. COMPARISON:  12/18/2020 FINDINGS: Brain: No evidence of acute infarction, hemorrhage, hydrocephalus, extra-axial collection or mass lesion/mass effect. Mild atrophic changes are noted. Vascular: No hyperdense vessel or unexpected calcification. Skull: Normal. Negative for fracture or focal lesion. Sinuses/Orbits: No acute finding. Chronic mucosal retention cyst is noted within the right maxillary antrum. Other: Mild soft tissue swelling is noted in the left forehead consistent with the recent injury. IMPRESSION: Chronic atrophic changes without acute intracranial abnormality. Mild scalp swelling in the left forehead. Electronically Signed   By: Inez Catalina M.D.   On: 01/19/2021 01:05   CT Cervical Spine Wo Contrast  Result Date: 01/19/2021 CLINICAL DATA:  Fall EXAM: CT CERVICAL SPINE WITHOUT CONTRAST TECHNIQUE: Multidetector CT imaging of the cervical spine was performed without intravenous contrast. Multiplanar CT image reconstructions were also generated. COMPARISON:  None. FINDINGS: Alignment: No static subluxation. Facets are aligned. Occipital  condyles and the lateral masses of C1 and C2 are normally approximated. Skull base and vertebrae: No acute fracture. Soft tissues and spinal canal: No prevertebral fluid or swelling. No visible canal hematoma. Disc levels: No advanced spinal canal or neural foraminal stenosis. Upper chest: No pneumothorax, pulmonary nodule or pleural effusion. Other: Normal visualized paraspinal cervical soft tissues. IMPRESSION: No acute fracture or static subluxation of the cervical spine. Electronically Signed   By: Ulyses Jarred M.D.   On: 01/19/2021 03:10   CT Cervical Spine Wo  Contrast  Result Date: 01/19/2021 CLINICAL DATA:  Recent fall with neck pain, initial encounter EXAM: CT CERVICAL SPINE WITHOUT CONTRAST TECHNIQUE: Multidetector CT imaging of the cervical spine was performed without intravenous contrast. Multiplanar CT image reconstructions were also generated. COMPARISON:  09/01/2020 FINDINGS: Alignment: Limits. Skull base and vertebrae: 7 cervical segments are well visualized. Vertebral body height is well maintained. No acute fracture or acute facet abnormality is noted. Multilevel osteophytic changes, disc space narrowing and facet hypertrophic changes are noted worst at C4-5. mild anterolisthesis is noted of C4 on C5 stable from the prior exam. Soft tissues and spinal canal: Surrounding soft tissue structures show vascular calcification. No soft tissue hematoma is noted. Upper chest: Visualized lung apices are within normal limits. Other: None. IMPRESSION: Multilevel degenerative change without acute abnormality. Electronically Signed   By: Inez Catalina M.D.   On: 01/19/2021 01:11    ____________________________________________   PROCEDURES   Procedure(s) performed (including Critical Care):  .1-3 Lead EKG Interpretation Performed by: Hinda Kehr, MD Authorized by: Hinda Kehr, MD     Interpretation: normal     ECG rate:  90   ECG rate assessment: normal     Rhythm: sinus rhythm     Ectopy: none     Conduction: normal   .Critical Care Performed by: Hinda Kehr, MD Authorized by: Hinda Kehr, MD   Critical care provider statement:    Critical care time (minutes):  30   Critical care time was exclusive of:  Separately billable procedures and treating other patients   Critical care was necessary to treat or prevent imminent or life-threatening deterioration of the following conditions:  Metabolic crisis   Critical care was time spent personally by me on the following activities:  Development of treatment plan with patient or surrogate,  evaluation of patient's response to treatment, examination of patient, obtaining history from patient or surrogate, ordering and performing treatments and interventions, ordering and review of laboratory studies, ordering and review of radiographic studies, pulse oximetry, re-evaluation of patient's condition and review of old charts .Marland KitchenLaceration Repair  Date/Time: 01/19/2021 6:48 AM Performed by: Hinda Kehr, MD Authorized by: Hinda Kehr, MD   Consent:    Consent obtained:  Verbal   Consent given by:  Patient   Risks discussed:  Infection, poor cosmetic result, need for additional repair and pain Anesthesia:    Anesthesia method:  None Laceration details:    Location:  Face   Face location:  Forehead   Length (cm):  3 (H-shaped laceration) Exploration:    Contaminated: no   Treatment:    Amount of cleaning:  Standard   Irrigation solution:  Sterile saline   Visualized foreign bodies/material removed: no   Skin repair:    Repair method:  Tissue adhesive and Steri-Strips   Number of Steri-Strips:  3 Approximation:    Approximation:  Close Repair type:    Repair type:  Simple Post-procedure details:    Dressing:  Open (no dressing)  Procedure completion:  Tolerated well, no immediate complications .Marland KitchenLaceration Repair  Date/Time: 01/19/2021 7:47 AM Performed by: Hinda Kehr, MD Authorized by: Hinda Kehr, MD   Consent:    Consent obtained:  Verbal   Consent given by:  Patient   Risks discussed:  Infection, pain, retained foreign body, poor cosmetic result and poor wound healing Universal protocol:    Patient identity confirmed:  Verbally with patient and arm band Anesthesia:    Anesthesia method:  Local infiltration and topical application   Topical anesthetic:  LET   Local anesthetic:  Lidocaine 1% w/o epi Laceration details:    Location:  Face   Face location:  Forehead   Length (cm):  5 (Stellate laceration) Exploration:    Hemostasis achieved with:   Direct pressure   Contaminated: no   Treatment:    Area cleansed with:  Saline   Amount of cleaning:  Extensive   Irrigation solution:  Sterile saline   Visualized foreign bodies/material removed: no   Skin repair:    Repair method:  Sutures   Suture size:  4-0   Wound skin closure material used: Ethilon.   Suture technique: 1 half-buried horizontal mattress suture, 4 simple interrupted.   Number of sutures:  5 Approximation:    Approximation:  Close Repair type:    Repair type:  Simple Post-procedure details:    Dressing:  Sterile dressing   Procedure completion:  Tolerated well, no immediate complications   ____________________________________________   INITIAL IMPRESSION / MDM / ASSESSMENT AND PLAN / ED COURSE  As part of my medical decision making, I reviewed the following data within the [place] notes reviewed and incorporated, Labs reviewed , EKG interpreted , Old chart reviewed, Discussed with admitting physician , Discussed with radiologist, A consult was requested and obtained from this/these consultant(s) Neurosurgery, and Notes from prior ED visits   Differential diagnosis includes, but is not limited to, alcoholic ketoacidosis, and he will have a number of electrolyte abnormalities, intracranial bleeding, fracture.  The patient is on the cardiac monitor to evaluate for evidence of arrhythmia and/or significant heart rate changes.  Unfortunately the patient had a fall while in the emergency department waiting room which resulted in another head injury.  He was brought back to a room which is now available; no room was available previously.  Patient is being watched closely and is being set for a second CT head and CT cervical spine.  Patient reports no distress and says he has no pain.  However his labs are concerning.  Given the abnormalities we sent a repeat of the CBC and metabolic panel, but his comprehensive metabolic panel is notable  for a sodium of 125, potassium of 2.7, magnesium of 1.1, and an increased anion gap of 16.  The repeat basic metabolic panel was consistent with a worsening anion gap.  I ordered LR 1 L bolus for some borderline hypotension but he meets criteria for alcoholic ketoacidosis.  Additionally, ethanol level is 340.  Treatment: D5 normal saline infusion at 100 mL/h, thiamine 100 mg IV, CIWA protocol.  CT scans are pending.     Clinical Course as of 01/19/21 0746  Mon Jan 19, 2021  0312 Discussed case with Dr. Rosalee Kaufman with radiology.  Patient has a small subdural hematoma without midline shift nor mass effect. New since original scan. [CF]  0319 Patient reports no abnormalities right now including no pain and spite of the obvious trauma to his head.  Paging Dr.  Smith with neurosurgery to discuss. [CF]  0350 I discussed the case by phone with Dr. Tamala Julian.  He said to repeat a head CT and about 5 to 6 hours, otherwise nothing else to do emergently.  He will come evaluate the patient in person.  I subsequently discussed the case by phone with Dr. Damita Dunnings including the head injury, the alcoholic ketoacidosis, etc.  She will admit him for further management. [CF]    Clinical Course User Index [CF] Hinda Kehr, MD     ____________________________________________  FINAL CLINICAL IMPRESSION(S) / ED DIAGNOSES  Final diagnoses:  Fall, initial encounter  Laceration of scalp, initial encounter  Hematoma of frontal scalp, initial encounter  Subdural hematoma  Alcoholic ketoacidosis  Alcohol abuse  Hypokalemia  Hypomagnesemia  Alcoholic intoxication with complication ([place])     MEDICATIONS GIVEN DURING THIS VISIT:  Medications  potassium chloride 10 mEq in 100 mL IVPB (10 mEq Intravenous New Bag/Given 01/19/21 0435)  magnesium sulfate IVPB 2 g 50 mL (2 g Intravenous New Bag/Given 01/19/21 0435)  LORazepam (ATIVAN) injection 0-4 mg (0 mg Intravenous Not Given 01/19/21 0406)    Or  LORazepam (ATIVAN)  tablet 0-4 mg ( Oral See Alternative 01/19/21 0406)  LORazepam (ATIVAN) injection 0-4 mg (has no administration in time range)    Or  LORazepam (ATIVAN) tablet 0-4 mg (has no administration in time range)  thiamine tablet 100 mg ( Oral See Alternative 01/19/21 0434)    Or  thiamine (B-1) injection 100 mg (100 mg Intravenous Given 01/19/21 0434)  dextrose 5 %-0.9 % sodium chloride infusion (0 mLs Intravenous Hold 01/19/21 0435)  acetaminophen (TYLENOL) tablet 650 mg (has no administration in time range)    Or  acetaminophen (TYLENOL) suppository 650 mg (has no administration in time range)  HYDROcodone-acetaminophen (NORCO/VICODIN) 5-325 MG per tablet 1-2 tablet (has no administration in time range)  ondansetron (ZOFRAN) tablet 4 mg (has no administration in time range)    Or  ondansetron (ZOFRAN) injection 4 mg (has no administration in time range)  multivitamin with minerals tablet 1 tablet (has no administration in time range)  folic acid (FOLVITE) tablet 1 mg (has no administration in time range)  lactated ringers bolus 1,000 mL (1,000 mLs Intravenous Bolus 01/19/21 0244)  potassium chloride SA (KLOR-CON) CR tablet 40 mEq (40 mEq Oral Given 01/19/21 0435)  Tdap (BOOSTRIX) injection 0.5 mL (0.5 mLs Intramuscular Given 01/19/21 0433)  lidocaine-EPINEPHrine-tetracaine (LET) topical gel (3 mLs Topical Given 01/19/21 0434)     ED Discharge Orders     None        Note:  This document was prepared using Dragon voice recognition software and may include unintentional dictation errors.   Hinda Kehr, MD 01/19/21 3300    Hinda Kehr, MD 01/19/21 [phone]

## 2021-01-19 NOTE — Progress Notes (Signed)
PT Cancellation Note  Patient Details Name: Fernando [name] MRN: [id number] DOB: Aug 04, 1955   Cancelled Treatment:    Reason Eval/Treat Not Completed: Medical issues which prohibited therapy. Order received, pt chart reviewed. Patient receiving platelet transfusion this afternoon. RN also reports frequent black tarry stools and recommends to hold PT until tomorrow. Will re-attempt evaluation at later time/date when pt is medically appropriate.     Patrina Levering PT, DPT 01/19/21 3:43 PM [phone]

## 2021-01-19 NOTE — Consult Note (Signed)
Primary Physician:  [name], MD  Chief Complaint: SDH  History of Present Illness: 01/19/2021 Fernando [name] is a 65 y.o. male who presents with the chief complaint of head trauma. He suffered 2 falls with laceration injuries to his face. After the second fall repeat head ct demonstrated a parafalcine hemorrhage as well as a convexity SDH. He denies headaches, weakness, seizures, numbness, disorientation, or nausea and vomiting. He denies taking any blood thinners. He has had previous falls in the past.   Review of Systems:  A 10 point review of systems is negative, except for the pertinent positives and negatives detailed in the HPI.  Past Medical History: Past Medical History:  Diagnosis Date   Alcohol dependence ([place])    Arthritis    Cataract    Chronic systolic (congestive) heart failure (HCC)    EF 25-30% in 10/2019   Dyslipidemia    Essential hypertension    History of kidney stones    Hyperlipidemia    Hypertension    Pneumonia    Prosthetic shoulder infection, initial encounter [facility])     Past Surgical History: Past Surgical History:  Procedure Laterality Date   BIOPSY  04/21/2020   Procedure: BIOPSY;  Surgeon: Irving Copas., MD;  Location: [place];  Service: Gastroenterology;;   BIOPSY  08/17/2020   Procedure: BIOPSY;  Surgeon: Carol Ada, MD;  Location: [place];  Service: Endoscopy;;   COLONOSCOPY     COLONOSCOPY     multiple   ESOPHAGOGASTRODUODENOSCOPY N/A 12/20/2020   Procedure: ESOPHAGOGASTRODUODENOSCOPY (EGD);  Surgeon: Virgel Manifold, MD;  Location: [facility] ENDOSCOPY;  Service: Endoscopy;  Laterality: N/A;   ESOPHAGOGASTRODUODENOSCOPY (EGD) WITH PROPOFOL N/A 01/31/2020   Procedure: ESOPHAGOGASTRODUODENOSCOPY (EGD) WITH PROPOFOL;  Surgeon: Doran Stabler, MD;  Location: [place];  Service: Gastroenterology;  Laterality: N/A;   ESOPHAGOGASTRODUODENOSCOPY (EGD) WITH PROPOFOL N/A 04/21/2020   Procedure:  ESOPHAGOGASTRODUODENOSCOPY (EGD) WITH PROPOFOL;  Surgeon: Rush Landmark Telford Nab., MD;  Location: [place];  Service: Gastroenterology;  Laterality: N/A;   ESOPHAGOGASTRODUODENOSCOPY (EGD) WITH PROPOFOL N/A 08/17/2020   Procedure: ESOPHAGOGASTRODUODENOSCOPY (EGD) WITH PROPOFOL;  Surgeon: Carol Ada, MD;  Location: [place];  Service: Endoscopy;  Laterality: N/A;   EUS N/A 04/21/2020   Procedure: UPPER ENDOSCOPIC ULTRASOUND (EUS) LINEAR;  Surgeon: Irving Copas., MD;  Location: [place];  Service: Gastroenterology;  Laterality: N/A;   [place]   left men. repair   KNEE SURGERY  1975   REVERSE SHOULDER ARTHROPLASTY Right 06/21/2018   Procedure: REVERSE SHOULDER ARTHROPLASTY;  Surgeon: Hiram Gash, MD;  Location: [facility];  Service: Orthopedics;  Laterality: Right;   ROTATOR CUFF REPAIR     right   ROTATOR CUFF REPAIR     TONSILLECTOMY     TOTAL SHOULDER REPLACEMENT  [id number]   UMBILICAL HERNIA REPAIR      Allergies: Allergies as of 01/19/2021 - Review Complete 01/19/2021  Allergen Reaction Noted   Atorvastatin  04/15/2020   Doxycycline  04/15/2020    Medications:  Current Facility-Administered Medications:    acetaminophen (TYLENOL) tablet 650 mg, 650 mg, Oral, Q6H PRN **OR** acetaminophen (TYLENOL) suppository 650 mg, 650 mg, Rectal, Q6H PRN, Athena Masse, MD   dextrose 5 %-0.9 % sodium chloride infusion, , Intravenous, Continuous, Hinda Kehr, MD, Held at 50/35/46 5681   folic acid (FOLVITE) tablet 1 mg, 1 mg, Oral, Daily, Athena Masse, MD   HYDROcodone-acetaminophen (NORCO/VICODIN) 5-325 MG per tablet 1-2 tablet, 1-2  tablet, Oral, Q4H PRN, Athena Masse, MD   LORazepam (ATIVAN) injection 0-4 mg, 0-4 mg, Intravenous, Q6H **OR** LORazepam (ATIVAN) tablet 0-4 mg, 0-4 mg, Oral, Q6H, Hinda Kehr, MD   [START ON 01/21/2021] LORazepam (ATIVAN) injection 0-4 mg, 0-4 mg, Intravenous, Q12H **OR** [START ON 01/21/2021] LORazepam  (ATIVAN) tablet 0-4 mg, 0-4 mg, Oral, Q12H, Hinda Kehr, MD   multivitamin with minerals tablet 1 tablet, 1 tablet, Oral, Daily, Damita Dunnings, Waldemar Dickens, MD   ondansetron (ZOFRAN) tablet 4 mg, 4 mg, Oral, Q6H PRN **OR** ondansetron (ZOFRAN) injection 4 mg, 4 mg, Intravenous, Q6H PRN, Athena Masse, MD   potassium chloride 10 mEq in 100 mL IVPB, 10 mEq, Intravenous, Q1 Hr x 6, Hinda Kehr, MD, Last Rate: 100 mL/hr at 01/19/21 0511, 10 mEq at 01/19/21 0511   thiamine tablet 100 mg, 100 mg, Oral, Daily **OR** thiamine (B-1) injection 100 mg, 100 mg, Intravenous, Daily, Hinda Kehr, MD, 100 mg at 01/19/21 0434  Current Outpatient Medications:    albuterol (PROVENTIL) (2.5 MG/3ML) 0.083% nebulizer solution, Take 3 mLs (2.5 mg total) by nebulization every 6 (six) hours as needed for wheezing. (Patient not taking: Reported on 12/18/2020), Disp: 75 mL, Rfl: 12   allopurinol (ZYLOPRIM) 100 MG tablet, Take 100 mg by mouth daily., Disp: , Rfl:    arformoterol (BROVANA) 15 MCG/2ML NEBU, Take 2 mLs (15 mcg total) by nebulization 2 (two) times daily. (Patient not taking: Reported on 12/18/2020), Disp: 120 mL, Rfl:    budesonide (PULMICORT) 0.25 MG/2ML nebulizer solution, Take 2 mLs (0.25 mg total) by nebulization 2 (two) times daily. (Patient not taking: Reported on 12/18/2020), Disp: 60 mL, Rfl: 12   carvedilol (COREG) 3.125 MG tablet, Take 1 tablet (3.125 mg total) by mouth 2 (two) times daily. Do not take is Systolic BP is <144, Disp: , Rfl:    docusate sodium (COLACE) 100 MG capsule, Take 1 capsule (100 mg total) by mouth 2 (two) times daily as needed for mild constipation., Disp: 10 capsule, Rfl: 0   famotidine (PEPCID) 40 MG tablet, Take 1 tablet by mouth at bedtime. (Patient not taking: Reported on 12/18/2020), Disp: , Rfl:    folic acid (FOLVITE) 1 MG tablet, Take 1 mg by mouth daily., Disp: , Rfl:    furosemide (LASIX) 40 MG tablet, Take 1 tablet (40 mg total) by mouth daily., Disp: 30 tablet, Rfl: 11    guaiFENesin-dextromethorphan (ROBITUSSIN DM) 100-10 MG/5ML syrup, Take 10 mLs by mouth every 6 (six) hours as needed for cough. (Patient not taking: Reported on 12/18/2020), Disp: 118 mL, Rfl: 0   losartan (COZAAR) 25 MG tablet, Take 0.5 tablets (12.5 mg total) by mouth daily. DO not take if systolic blood pressure <315, Disp: 15 tablet, Rfl: 11   Multiple Vitamin (MULTIVITAMIN) tablet, Take 1 tablet by mouth daily.  (Patient not taking: Reported on 12/18/2020), Disp: , Rfl:    nicotine (NICODERM CQ - DOSED IN MG/24 HOURS) 14 mg/24hr patch, Place 1 patch (14 mg total) onto the skin daily. (Patient not taking: Reported on 12/18/2020), Disp: 28 patch, Rfl: 0   pantoprazole (PROTONIX) 40 MG tablet, Take 1 tablet (40 mg total) by mouth 2 (two) times daily for 14 days, THEN 1 tablet (40 mg total) daily., Disp: 98 tablet, Rfl: 0   revefenacin (YUPELRI) 175 MCG/3ML nebulizer solution, Take 3 mLs (175 mcg total) by nebulization daily. (Patient not taking: Reported on 12/18/2020), Disp: , Rfl:    thiamine 100 MG tablet, Take 1 tablet (100 mg  total) by mouth daily. (Patient not taking: Reported on 12/18/2020), Disp: 100 tablet, Rfl: 0   Social History: Social History   Tobacco Use   Smoking status: Every Day    Packs/day: 0.50    Years: 50.00    Pack years: 25.00    Types: Cigarettes   Smokeless tobacco: Never  Vaping Use   Vaping Use: Never used  Substance Use Topics   Alcohol use: Yes    Alcohol/week: 14.0 standard drinks    Types: 14 Shots of liquor per week   Drug use: Not Currently    Types: Marijuana    Family Medical History: Family History  Problem Relation Age of Onset   Heart failure Mother    Lung cancer Father    Colon cancer Neg Hx    Esophageal cancer Neg Hx    Stomach cancer Neg Hx    Liver cancer Neg Hx    Pancreatic cancer Neg Hx    Rectal cancer Neg Hx    Inflammatory bowel disease Neg Hx     Physical Examination: Vitals:   01/19/21 0230 01/19/21 0235  BP: (!) 82/42  96/85  Pulse: 76 94  Resp: 19 16  Temp:    SpO2: 97% 90%     General: Patient is well developed, well nourished, calm, collected, and in no apparent distress Facial lacerations x2.   Psychiatric: Patient is non-anxious.  Head:  Pupils equal, round, and reactive to light.  ENT:  Oral mucosa appears well hydrated.  Neck:   Supple.  Full range of motion.  Respiratory: Patient is breathing without any difficulty.  Extremities: No edema.  Vascular: Palpable pulses in dorsal pedal vessels.  Skin:   On exposed skin, there are no abnormal skin lesions.  NEUROLOGICAL:  General: In no acute distress.   Awake, alert, oriented to person, place, and time.  Pupils equal round and reactive to light.  Facial tone is symmetric.  Tongue protrusion is midline.  There is no pronator drift. No gross deficits.  Bilateral upper and lower extremity sensation is intact to light touch. Reflexes are w+ and symmetric at the biceps, triceps, brachioradialis, patella and achilles. Hoffman's is absent.  GCS 15  Imaging: Narrative & Impression  CLINICAL DATA:  Fall   EXAM: CT HEAD WITHOUT CONTRAST   TECHNIQUE: Contiguous axial images were obtained from the base of the skull through the vertex without intravenous contrast.   COMPARISON:  None.   FINDINGS: Brain: There is now an acute subdural hematoma along the anterior falx cerebri, measuring 4 mm in thickness. Component of hematoma over the right convexity measures 5 mm.   Vascular: Atherosclerotic calcification of the vertebral and internal carotid arteries at the skull base. No abnormal hyperdensity of the major intracranial arteries or dural venous sinuses.   Skull: Right frontal scalp hematoma.  No skull fracture.   Sinuses/Orbits: Fluid in the right maxillary sinus. The orbits are normal.   IMPRESSION: 1. Acute subdural hematoma along the anterior falx cerebri, measuring 4 mm in thickness. Component of hematoma over the  right convexity measures 5 mm. 2. Right frontal scalp hematoma without skull fracture.   Critical Value/emergent results were called by telephone at the time of interpretation on 01/19/2021 at 3:10 am to provider Hinda Kehr, who verbally acknowledged these results.     Electronically Signed   By: Ulyses Jarred M.D.   On: 01/19/2021 03:10    I have personally reviewed the images and agree with the above interpretation.  Labs: CBC Latest Ref Rng & Units 01/19/2021 01/19/2021 12/24/2020  WBC 4.0 - 10.5 K/uL 6.9 5.8 4.0  Hemoglobin 13.0 - 17.0 g/dL 10.7(L) 10.5(L) 9.0(L)  Hematocrit 39.0 - 52.0 % 28.7(L) 28.1(L) 26.6(L)  Platelets 150 - 400 K/uL 53(L) 51(L) 95(L)     Assessment and Plan: Fernando [name] is a pleasant 65 y.o. male with a fall causing a SDH in the parafalcine and convexities. He denies blood thinners. Denies any neurological deficits or symptoms. Has not had seizure or progressive worsening. On exam is GCS 15 without pronator drift. Imaging shows minimal mass effect. Given his current symptoms would plan on repeat head CT and 1 week of Keprra. Avoid ASA or anticoagulants at this time.  Stevan Born, MD Dept. of Neurosurgery

## 2021-01-19 NOTE — ED Triage Notes (Signed)
Pt to ED via [facility] with c/o fall while getting up off the toilet approx 1hr PTA. Per EMS pt endorses ETOH use tonight, per EMS bandage in place PTA.    108/62 96HR 90% RA

## 2021-01-19 NOTE — ED Notes (Signed)
Lab called to assist with lab draw.

## 2021-01-19 NOTE — Progress Notes (Addendum)
Patient ID: Fernando [name], male   DOB: Dec 02, 1955, 65 y.o.   MRN: [id number]  Called by nursing staff that he had numerous black bowel movements.  Will make n.p.o.  Start Protonix iv.  GI consult.  Patient ate a few bites of breakfast and only a few bites of lunch.  Repeat hemoglobin with potassium and magnesium.  Hold antihypertensive medications.  Since platelet count on the lower side I will give a platelet transfusion.  Benefits and risks of transfusion explained to patient.  Dr Loletha Grayer

## 2021-01-19 NOTE — Progress Notes (Signed)
Repeat CT scan of the head shows a very stable small SDH.  We will plan for follow-up as an outpatient in 3 to 4 weeks.  Patient is cleared for any DVT prophylaxis as needed in 72 hours.  He should hold all antiplatelets for at least 1 week.

## 2021-01-19 NOTE — ED Notes (Signed)
Pt at CT

## 2021-01-19 NOTE — Progress Notes (Signed)
OT Cancellation Note  Patient Details Name: Fernando [name] MRN: [id number] DOB: [birth year]   Cancelled Treatment:    Reason Eval/Treat Not Completed: Medical issues which prohibited therapy. OT order received. Chart reviewed - pt noted to have K+ critically low at 2.7; contraindicated for exertional activity at this time. Will continue to follow and initiate services as pt medically appropriate to participate in therapy.    Fredirick Maudlin, OTR/L [place]

## 2021-01-19 NOTE — Consult Note (Signed)
[facility] Gastroenterology Inpatient Consultation   Patient ID: Fernando [name] is a 65 y.o. male.  Requesting Provider: Loletha Grayer, MD  Date of Admission: 01/19/2021  Date of Consult: 01/19/21   Reason for Consultation: melena  Patient's Chief Complaint:   Chief Complaint  Patient presents with   Fall    65 year old Caucasian male with alcohol abuse and history of withdrawals, hepatic steatosis (not cirrhosis- no imaging has shown this thus far), HFpEF who presents to the hospital status post fall off of the toilet with hypoxia.  Gastroenterology is consulted for reported melena.  Patient had additional fall off the toilet in the emergency department waiting room with head strike per notation.  On CT was noted to have a subdural hematoma.  While in the emergency department he was noted by nursing staff to have multiple dark stools without bright red blood. Denies abdominal pain, nausea, vomiting.  Patient was recently hospitalized from 9/15-9/21 with melena and underwent EGD demonstrating esophagitis, clean-based gastric erosions.  Previously he had undergone EGD in May of this year as well demonstrating similar findings along with gastropathy.  Reports he is taking his PPI and "everything that he is prescribed."  He denies taking iron supplementation. Denies epistaxis.  After his fall in the bathroom- he was hypotensive at 82/42 but improved to 106/67 with resuscitation. Alcohol level of 340.  He is currently mildly tachycardic but appears to be actively withdrawing from alcohol on evaluation.  He reports he goes through a 750 mL bottle of bourbon per week.  He is down to 1/2 pack of cigarettes per day  Reports colonoscopy 3 years ago in [place].  Denies NSAIDs, Anti-plt agents, and anticoagulants Denies family history of gastrointestinal disease and malignancy Previous Endoscopies: colon three years ago with h/o polyps. Egd 12/2020 most recent with esophagitis and gastric  erosions   Past Medical History:  Diagnosis Date   Alcohol dependence ([place])    Arthritis    Cataract    Chronic systolic (congestive) heart failure (HCC)    EF 25-30% in 10/2019   Dyslipidemia    Essential hypertension    History of kidney stones    Hyperlipidemia    Hypertension    Pneumonia    Prosthetic shoulder infection, initial encounter [facility])     Past Surgical History:  Procedure Laterality Date   BIOPSY  04/21/2020   Procedure: BIOPSY;  Surgeon: Irving Copas., MD;  Location: [place];  Service: Gastroenterology;;   BIOPSY  08/17/2020   Procedure: BIOPSY;  Surgeon: Carol Ada, MD;  Location: [facility] ENDOSCOPY;  Service: Endoscopy;;   COLONOSCOPY     COLONOSCOPY     multiple   ESOPHAGOGASTRODUODENOSCOPY N/A 12/20/2020   Procedure: ESOPHAGOGASTRODUODENOSCOPY (EGD);  Surgeon: Virgel Manifold, MD;  Location: [facility] ENDOSCOPY;  Service: Endoscopy;  Laterality: N/A;   ESOPHAGOGASTRODUODENOSCOPY (EGD) WITH PROPOFOL N/A 01/31/2020   Procedure: ESOPHAGOGASTRODUODENOSCOPY (EGD) WITH PROPOFOL;  Surgeon: Doran Stabler, MD;  Location: [place];  Service: Gastroenterology;  Laterality: N/A;   ESOPHAGOGASTRODUODENOSCOPY (EGD) WITH PROPOFOL N/A 04/21/2020   Procedure: ESOPHAGOGASTRODUODENOSCOPY (EGD) WITH PROPOFOL;  Surgeon: Rush Landmark Telford Nab., MD;  Location: [place];  Service: Gastroenterology;  Laterality: N/A;   ESOPHAGOGASTRODUODENOSCOPY (EGD) WITH PROPOFOL N/A 08/17/2020   Procedure: ESOPHAGOGASTRODUODENOSCOPY (EGD) WITH PROPOFOL;  Surgeon: Carol Ada, MD;  Location: [place];  Service: Endoscopy;  Laterality: N/A;   EUS N/A 04/21/2020   Procedure: UPPER ENDOSCOPIC ULTRASOUND (EUS) LINEAR;  Surgeon: Rush Landmark Telford Nab., MD;  Location: [place];  Service:  Gastroenterology;  Laterality: N/A;   [place]   left men. repair   KNEE SURGERY  1975   REVERSE SHOULDER ARTHROPLASTY Right 06/21/2018   Procedure:  REVERSE SHOULDER ARTHROPLASTY;  Surgeon: Hiram Gash, MD;  Location: [facility];  Service: Orthopedics;  Laterality: Right;   ROTATOR CUFF REPAIR     right   ROTATOR CUFF REPAIR     TONSILLECTOMY     TOTAL SHOULDER REPLACEMENT  [id number]   UMBILICAL HERNIA REPAIR      Allergies  Allergen Reactions   Atorvastatin     Other reaction(s): joints ache   Doxycycline     Other reaction(s): rash/hive    Family History  Problem Relation Age of Onset   Heart failure Mother    Lung cancer Father    Colon cancer Neg Hx    Esophageal cancer Neg Hx    Stomach cancer Neg Hx    Liver cancer Neg Hx    Pancreatic cancer Neg Hx    Rectal cancer Neg Hx    Inflammatory bowel disease Neg Hx     Social History   Tobacco Use   Smoking status: Every Day    Packs/day: 0.50    Years: 50.00    Pack years: 25.00    Types: Cigarettes   Smokeless tobacco: Never  Vaping Use   Vaping Use: Never used  Substance Use Topics   Alcohol use: Yes    Alcohol/week: 14.0 standard drinks    Types: 14 Shots of liquor per week   Drug use: Not Currently    Types: Marijuana     Pertinent GI related history and allergies were reviewed with the patient  Review of Systems  Constitutional:  Positive for appetite change (diminished). Negative for activity change, chills, fatigue, fever and unexpected weight change.  HENT:  Negative for trouble swallowing and voice change.   Respiratory:  Negative for shortness of breath.   Cardiovascular:  Negative for chest pain and palpitations.  Gastrointestinal:  Positive for diarrhea (dark stools). Negative for abdominal distention, abdominal pain, anal bleeding, blood in stool, constipation, nausea and vomiting.  Musculoskeletal:  Negative for arthralgias and myalgias.  Skin:  Negative for color change and pallor.  Neurological:  Positive for dizziness, syncope, weakness and light-headedness.  Psychiatric/Behavioral:  Negative for confusion. The patient is not nervous/anxious.    All other systems reviewed and are negative.   Medications Home Medications No current facility-administered medications on file prior to encounter.   Current Outpatient Medications on File Prior to Encounter  Medication Sig Dispense Refill   omeprazole (PRILOSEC) 40 MG capsule Take 40 mg by mouth daily.     allopurinol (ZYLOPRIM) 100 MG tablet Take 100 mg by mouth daily.     carvedilol (COREG) 3.125 MG tablet Take 1 tablet (3.125 mg total) by mouth 2 (two) times daily. Do not take is Systolic BP is <921 (Patient not taking: No sig reported)     docusate sodium (COLACE) 100 MG capsule Take 1 capsule (100 mg total) by mouth 2 (two) times daily as needed for mild constipation. (Patient not taking: No sig reported) 10 capsule 0   folic acid (FOLVITE) 1 MG tablet Take 1 mg by mouth daily. (Patient not taking: No sig reported)     furosemide (LASIX) 40 MG tablet Take 1 tablet (40 mg total) by mouth daily. (Patient not taking: No sig reported) 30 tablet 11   losartan (COZAAR) 25 MG  tablet Take 0.5 tablets (12.5 mg total) by mouth daily. DO not take if systolic blood pressure <790 (Patient not taking: No sig reported) 15 tablet 11   Multiple Vitamin (MULTIVITAMIN) tablet Take 1 tablet by mouth daily.  (Patient not taking: No sig reported)     pantoprazole (PROTONIX) 40 MG tablet Take 1 tablet (40 mg total) by mouth 2 (two) times daily for 14 days, THEN 1 tablet (40 mg total) daily. (Patient not taking: Reported on 01/19/2021) 98 tablet 0   thiamine 100 MG tablet Take 1 tablet (100 mg total) by mouth daily. (Patient not taking: No sig reported) 100 tablet 0   Pertinent GI related medications were reviewed with the patient  Inpatient Medications  Current Facility-Administered Medications:    0.9 %  sodium chloride infusion (Manually program via Guardrails IV Fluids), , Intravenous, Once, Wieting, Richard, MD   acetaminophen (TYLENOL) tablet 650 mg, 650 mg, Oral, Q6H PRN **OR** acetaminophen  (TYLENOL) suppository 650 mg, 650 mg, Rectal, Q6H PRN, Athena Masse, MD   acetaminophen (TYLENOL) tablet 650 mg, 650 mg, Oral, Once, Wieting, Richard, MD   allopurinol (ZYLOPRIM) tablet 100 mg, 100 mg, Oral, Daily, Wieting, Richard, MD   budesonide (PULMICORT) nebulizer solution 0.5 mg, 0.5 mg, Nebulization, BID, Leslye Peer, Richard, MD, 0.5 mg at 01/19/21 1118   cephALEXin (KEFLEX) capsule 500 mg, 500 mg, Oral, Q8H, Wieting, Richard, MD   dextrose 5 % and 0.9 % NaCl with KCl 20 mEq/L infusion, , Intravenous, Continuous, Wieting, Richard, MD, Last Rate: 75 mL/hr at 01/19/21 1122, New Bag at 24/09/73 5329   folic acid (FOLVITE) tablet 1 mg, 1 mg, Oral, Daily, Judd Gaudier V, MD, 1 mg at 01/19/21 1116   HYDROcodone-acetaminophen (NORCO/VICODIN) 5-325 MG per tablet 1-2 tablet, 1-2 tablet, Oral, Q4H PRN, Athena Masse, MD   ipratropium-albuterol (DUONEB) 0.5-2.5 (3) MG/3ML nebulizer solution 3 mL, 3 mL, Nebulization, Q6H, Wieting, Richard, MD, 3 mL at 01/19/21 1118   LORazepam (ATIVAN) injection 0-4 mg, 0-4 mg, Intravenous, Q6H **OR** LORazepam (ATIVAN) tablet 0-4 mg, 0-4 mg, Oral, Q6H, Hinda Kehr, MD, 1 mg at 01/19/21 1244   [START ON 01/21/2021] LORazepam (ATIVAN) injection 0-4 mg, 0-4 mg, Intravenous, Q12H **OR** [START ON 01/21/2021] LORazepam (ATIVAN) tablet 0-4 mg, 0-4 mg, Oral, Q12H, Hinda Kehr, MD   multivitamin with minerals tablet 1 tablet, 1 tablet, Oral, Daily, Judd Gaudier V, MD, 1 tablet at 01/19/21 1116   ondansetron (ZOFRAN) tablet 4 mg, 4 mg, Oral, Q6H PRN **OR** ondansetron (ZOFRAN) injection 4 mg, 4 mg, Intravenous, Q6H PRN, Athena Masse, MD   pantoprazole (PROTONIX) 80 mg /NS 100 mL IVPB, 80 mg, Intravenous, Once, Loletha Grayer, MD   Derrill Memo ON 01/20/2021] pantoprazole (PROTONIX) injection 40 mg, 40 mg, Intravenous, Q12H, Wieting, Richard, MD   thiamine tablet 100 mg, 100 mg, Oral, Daily **OR** thiamine (B-1) injection 100 mg, 100 mg, Intravenous, Daily, Hinda Kehr,  MD, 100 mg at 01/19/21 0434  Current Outpatient Medications:    omeprazole (PRILOSEC) 40 MG capsule, Take 40 mg by mouth daily., Disp: , Rfl:    allopurinol (ZYLOPRIM) 100 MG tablet, Take 100 mg by mouth daily., Disp: , Rfl:    carvedilol (COREG) 3.125 MG tablet, Take 1 tablet (3.125 mg total) by mouth 2 (two) times daily. Do not take is Systolic BP is <924 (Patient not taking: No sig reported), Disp: , Rfl:    docusate sodium (COLACE) 100 MG capsule, Take 1 capsule (100 mg total) by mouth 2 (two) times daily  as needed for mild constipation. (Patient not taking: No sig reported), Disp: 10 capsule, Rfl: 0   folic acid (FOLVITE) 1 MG tablet, Take 1 mg by mouth daily. (Patient not taking: No sig reported), Disp: , Rfl:    furosemide (LASIX) 40 MG tablet, Take 1 tablet (40 mg total) by mouth daily. (Patient not taking: No sig reported), Disp: 30 tablet, Rfl: 11   losartan (COZAAR) 25 MG tablet, Take 0.5 tablets (12.5 mg total) by mouth daily. DO not take if systolic blood pressure <643 (Patient not taking: No sig reported), Disp: 15 tablet, Rfl: 11   Multiple Vitamin (MULTIVITAMIN) tablet, Take 1 tablet by mouth daily.  (Patient not taking: No sig reported), Disp: , Rfl:    pantoprazole (PROTONIX) 40 MG tablet, Take 1 tablet (40 mg total) by mouth 2 (two) times daily for 14 days, THEN 1 tablet (40 mg total) daily. (Patient not taking: Reported on 01/19/2021), Disp: 98 tablet, Rfl: 0   thiamine 100 MG tablet, Take 1 tablet (100 mg total) by mouth daily. (Patient not taking: No sig reported), Disp: 100 tablet, Rfl: 0  dextrose 5 % and 0.9 % NaCl with KCl 20 mEq/L 75 mL/hr at 01/19/21 1122   pantoprazole      acetaminophen **OR** acetaminophen, HYDROcodone-acetaminophen, ondansetron **OR** ondansetron (ZOFRAN) IV   Objective   Vitals:   01/19/21 0900 01/19/21 1000 01/19/21 1200 01/19/21 1300  BP: 136/78 121/61 133/76 116/67  Pulse: (!) 104 77 (!) 108 73  Resp: (!) 24 (!) 22 (!) 23 20  Temp:       TempSrc:      SpO2: 97% 97% 98% 95%  Weight:      Height:         Physical Exam Vitals and nursing note reviewed.  Constitutional:      General: He is not in acute distress.    Appearance: He is ill-appearing. He is not toxic-appearing or diaphoretic.  HENT:     Head: Normocephalic and atraumatic.     Nose: Nose normal.     Mouth/Throat:     Mouth: Mucous membranes are moist.     Pharynx: Oropharynx is clear.     Comments: No blood noted in the posterior oropharynx Eyes:     General: No scleral icterus.    Extraocular Movements: Extraocular movements intact.  Cardiovascular:     Rate and Rhythm: Regular rhythm. Tachycardia present.     Heart sounds: Normal heart sounds. No murmur heard.   No friction rub. No gallop.  Pulmonary:     Effort: Pulmonary effort is normal. No respiratory distress.     Breath sounds: Normal breath sounds. No wheezing, rhonchi or rales.  Abdominal:     General: Bowel sounds are normal. There is no distension.     Palpations: Abdomen is soft.     Tenderness: There is no abdominal tenderness. There is no guarding or rebound.  Musculoskeletal:     Cervical back: Neck supple.     Right lower leg: No edema.     Left lower leg: No edema.  Skin:    General: Skin is warm and dry.     Coloration: Skin is not jaundiced or pale.  Neurological:     Mental Status: He is alert.     Comments: Tremulous. Tongue fasciculations. Appears to be actively withdrawing from alcohol. Answers questions appropriately.  Psychiatric:        Thought Content: Thought content normal.  Judgment: Judgment normal.    Laboratory Data Recent Labs  Lab 01/19/21 0039 01/19/21 0236 01/19/21 1402  WBC 5.8 6.9  --   HGB 10.5* 10.7* 9.1*  HCT 28.1* 28.7*  --   PLT 51* 53*  --    Recent Labs  Lab 01/19/21 0039 01/19/21 0236 01/19/21 1402  NA 125* 126*  --   K 2.7* 2.7* 3.4*  CL 87* 87*  --   CO2 22 21*  --   BUN 10 10  --   CALCIUM 7.5* 7.6*  --   PROT 6.3*   --   --   BILITOT 1.7*  --   --   ALKPHOS 129*  --   --   ALT 39  --   --   AST 95*  --   --   GLUCOSE 100* 101*  --    No results for input(s): INR in the last 168 hours.  No results for input(s): LIPASE in the last 72 hours.      Imaging Studies: DG Chest 2 View  Result Date: 01/19/2021 CLINICAL DATA:  Hypoxia following fall, initial encounter EXAM: CHEST - 2 VIEW COMPARISON:  12/21/2020 FINDINGS: Cardiac shadow is stable. Lungs are well aerated bilaterally. Minimal scarring is seen in the bases improved from the prior exam. Postsurgical changes in right shoulder are noted. No acute bony abnormality is seen. IMPRESSION: No acute abnormality noted. Electronically Signed   By: Inez Catalina M.D.   On: 01/19/2021 01:13   CT Head Wo Contrast  Result Date: 01/19/2021 CLINICAL DATA:  Follow-up of subdural hematoma EXAM: CT HEAD WITHOUT CONTRAST TECHNIQUE: Contiguous axial images were obtained from the base of the skull through the vertex without intravenous contrast. COMPARISON:  01/19/2021 at 2:46 a.m. FINDINGS: Brain: As compared to the exam of the same date at 2:46 a.m. there is no change in the appearance of subdural hematoma along the RIGHT frontal convexity and along the falx. Ventricles are stable in size, no signs of intraventricular hemorrhage with stable appearance as well. No signs of new extra-axial hematoma. No midline shift. Hematoma in these areas measures between 3 and 5 mm. Signs of atrophy as before along with chronic microvascular ischemic change. Vascular: No hyperdense vessel or unexpected calcification. Skull: Normal. Negative for fracture or focal lesion. Sinuses/Orbits: Signs of RIGHT maxillary sinus disease as before. Other: RIGHT frontal scalp hematoma and laceration similar to previous imaging. IMPRESSION: No change in the appearance of subdural hematoma along the RIGHT frontal convexity and along the falx. No signs of new extra-axial collection. RIGHT frontal scalp  hematoma and laceration as before. Signs of RIGHT maxillary sinus disease as before. Electronically Signed   By: Zetta Bills M.D.   On: 01/19/2021 10:57   CT Head Wo Contrast  Result Date: 01/19/2021 CLINICAL DATA:  Fall EXAM: CT HEAD WITHOUT CONTRAST TECHNIQUE: Contiguous axial images were obtained from the base of the skull through the vertex without intravenous contrast. COMPARISON:  None. FINDINGS: Brain: There is now an acute subdural hematoma along the anterior falx cerebri, measuring 4 mm in thickness. Component of hematoma over the right convexity measures 5 mm. Vascular: Atherosclerotic calcification of the vertebral and internal carotid arteries at the skull base. No abnormal hyperdensity of the major intracranial arteries or dural venous sinuses. Skull: Right frontal scalp hematoma.  No skull fracture. Sinuses/Orbits: Fluid in the right maxillary sinus. The orbits are normal. IMPRESSION: 1. Acute subdural hematoma along the anterior falx cerebri, measuring 4 mm in  thickness. Component of hematoma over the right convexity measures 5 mm. 2. Right frontal scalp hematoma without skull fracture. Critical Value/emergent results were called by telephone at the time of interpretation on 01/19/2021 at 3:10 am to provider Hinda Kehr, who verbally acknowledged these results. Electronically Signed   By: Ulyses Jarred M.D.   On: 01/19/2021 03:10   CT Head Wo Contrast  Result Date: 01/19/2021 CLINICAL DATA:  Recent fall in the bathroom with forehead laceration, initial encounter EXAM: CT HEAD WITHOUT CONTRAST TECHNIQUE: Contiguous axial images were obtained from the base of the skull through the vertex without intravenous contrast. COMPARISON:  12/18/2020 FINDINGS: Brain: No evidence of acute infarction, hemorrhage, hydrocephalus, extra-axial collection or mass lesion/mass effect. Mild atrophic changes are noted. Vascular: No hyperdense vessel or unexpected calcification. Skull: Normal. Negative for  fracture or focal lesion. Sinuses/Orbits: No acute finding. Chronic mucosal retention cyst is noted within the right maxillary antrum. Other: Mild soft tissue swelling is noted in the left forehead consistent with the recent injury. IMPRESSION: Chronic atrophic changes without acute intracranial abnormality. Mild scalp swelling in the left forehead. Electronically Signed   By: Inez Catalina M.D.   On: 01/19/2021 01:05   CT Cervical Spine Wo Contrast  Result Date: 01/19/2021 CLINICAL DATA:  Fall EXAM: CT CERVICAL SPINE WITHOUT CONTRAST TECHNIQUE: Multidetector CT imaging of the cervical spine was performed without intravenous contrast. Multiplanar CT image reconstructions were also generated. COMPARISON:  None. FINDINGS: Alignment: No static subluxation. Facets are aligned. Occipital condyles and the lateral masses of C1 and C2 are normally approximated. Skull base and vertebrae: No acute fracture. Soft tissues and spinal canal: No prevertebral fluid or swelling. No visible canal hematoma. Disc levels: No advanced spinal canal or neural foraminal stenosis. Upper chest: No pneumothorax, pulmonary nodule or pleural effusion. Other: Normal visualized paraspinal cervical soft tissues. IMPRESSION: No acute fracture or static subluxation of the cervical spine. Electronically Signed   By: Ulyses Jarred M.D.   On: 01/19/2021 03:10   CT Cervical Spine Wo Contrast  Result Date: 01/19/2021 CLINICAL DATA:  Recent fall with neck pain, initial encounter EXAM: CT CERVICAL SPINE WITHOUT CONTRAST TECHNIQUE: Multidetector CT imaging of the cervical spine was performed without intravenous contrast. Multiplanar CT image reconstructions were also generated. COMPARISON:  09/01/2020 FINDINGS: Alignment: Limits. Skull base and vertebrae: 7 cervical segments are well visualized. Vertebral body height is well maintained. No acute fracture or acute facet abnormality is noted. Multilevel osteophytic changes, disc space narrowing and  facet hypertrophic changes are noted worst at C4-5. mild anterolisthesis is noted of C4 on C5 stable from the prior exam. Soft tissues and spinal canal: Surrounding soft tissue structures show vascular calcification. No soft tissue hematoma is noted. Upper chest: Visualized lung apices are within normal limits. Other: None. IMPRESSION: Multilevel degenerative change without acute abnormality. Electronically Signed   By: Inez Catalina M.D.   On: 01/19/2021 01:11    Assessment:   # Melena- reported - egd last month with gastric erosions, esophagitis and gastropathy - similar findings in May of this year and October of last year - BUN/Cr ratio 12.5 - h pylori negaive on previous egd  # anemia- macrocytic - hgb comparable to where it was over the last year; - 2/2 to etoh bone marrow suppression - small decrease during hospitalization likely 2/2 head laceration and fluid resuscitation with hypotension  # subdural hematoma- 73mm on CT - 2/2 syncopal episode with head strike- with laceration  # thrombocytopenia- 53K - 2/2 etoh  use; no imaging findings of cirrhosis on multiple scans- CT and [place]  # EtOH abuse with withdrawal - level >300 on arrival # Alcoholic hepatitis - AST 95; ALT 39 # multiple electrolyte abnormalities  - hypo mag, hypokalemia hypo natremia # h/o esophagitis and gastric erosions on multiple egds # h/o pancreatic cysts # tobacco dependence # HFpEF  Plan:  -Has had several egds within the last year including last month with similar findings. Given now that he has subdural hematoma, withdrawing from alcohol- do not feel that repeat upper endoscopy would add any benefit at this time. Would not be an ideal candidate for anesthesia - would continue bid ppi iv 40 mg - maintain two sites iv access - monitor h&h; transfusion and resuscitation as per primary team - CIWA protocol and supportive care as per primary team - electrolyte correction as per primary team - when he starts  eating- high protein diet recommended - will collect inr for maddrey scoring- would not recommend steroids with etoh hepatitis at this time given SDH - recommend nsaid avoidance - if patient becomes hemodynamically unstable with further signs of bleeding- recommend stat CTA    I personally performed the service.  Management of other medical comorbidities as per primary team  Thank you for allowing [place] to participate in this patient's care. Please don't hesitate to call if any questions or concerns arise.   [name], DO [facility]  Portions of the record may have been created with voice recognition software. Occasional wrong-word or 'sound-a-like' substitutions may have occurred due to the inherent limitations of voice recognition software.  Read the chart carefully and recognize, using context, where substitutions may have occurred.

## 2021-01-19 NOTE — ED Notes (Signed)
This RN notified that a man was in the men's room bleeding, this RN to lobby men's restroom, pt found on floor with large hematoma to middle forehead, bleeding and laying on floor. Pt assisted to sitting position by this RN. This RN called charge RN, pressure dressing applied. EDP [place] to lobby men's bathroom to assess patient. Pt assisted to wheelchair and taken to room 16. Only obvious injury noted at time patient found is large hematoma to mid forehead with new laceration and active bleeding that is controlled with pressure dressing.

## 2021-01-19 NOTE — ED Triage Notes (Signed)
Pt with laceration over L eyebrow, denies any pain, bleeding controlled with bandage.   Pt noted to be 88-90% on RA, denies hx of COPD, is current every day smoker. Pt placed on 2L via [place].

## 2021-01-19 NOTE — ED Notes (Signed)
Pt scored a 7 on the CIWA score, 1 mg of PO ativan given

## 2021-01-19 NOTE — ED Notes (Signed)
Pt has had multiple bouts of black tarry diarrhea. Pt has also had multiple full bed linen and gown changes due to this.  Hemoccult card tested with a positive result, will notify MD

## 2021-01-19 NOTE — ED Notes (Signed)
Paper blood consent, filed in pt's paper chart.

## 2021-01-19 NOTE — H&P (Signed)
History and Physical    Fernando [name] PRX:[id number] DOB: 1955-07-07 DOA: 01/19/2021  PCP: [name], MD   Patient coming from: home  I have personally briefly reviewed patient's old medical records in [place]  Chief Complaint: fall  HPI: Fernando [name] is a 65 y.o. male with medical history significant for Diastolic CHF, alcohol use disorder with history of DTs, alcoholic liver cirrhosis, hypertension, hospitalized from 9/15-9/21 with upper GI bleed with EGD showing esophagitis and nonbleeding gastric ulcers who initially presented to the ED by EMS following a while trying to get up off the toilet he was noted to be mildly hypoxic in triage and was placed on oxygen.  Blood work was done and he had a head CT which was negative.  While awaiting further evaluation, he was found lying on t the bathroom floor and further work-up with a repeat CT head revealed a subdural hematoma .  Patient is otherwise awake and alert and is denying headache or visual disturbance.  He denies nausea or vomiting.  Had no cough or shortness of breath or fevers or chills.  He was noted by EMS to have alcohol on his breath  ED course: On arrival vitals were within normal limits however following his fall in the bathroom he was borderline hypotensive at 82/42, improving to 106/67 with IV hydration Blood work significant for hyponatremia of 125, hypokalemia of 2.7, hypomagnesemia of 1.1.  Stable anemia of 10.5-10.7.  EtOH level of 340.  AST/ALT of 95/39.  EKG, personally viewed and interpreted: Normal sinus rhythm at 89 with right bundle branch block  CT head with right frontal scalp hematoma and acute subdural hematoma of 4 mm  Neurosurgery was consulted and will see patient and put on note.  They recommended repeating CT in 5 hours.  Hospitalist consulted for admission.  Suture repair pending    Review of Systems: As per HPI otherwise all other systems on review of systems negative.    Past  Medical History:  Diagnosis Date   Alcohol dependence ([place])    Arthritis    Cataract    Chronic systolic (congestive) heart failure (HCC)    EF 25-30% in 10/2019   Dyslipidemia    Essential hypertension    History of kidney stones    Hyperlipidemia    Hypertension    Pneumonia    Prosthetic shoulder infection, initial encounter [facility])     Past Surgical History:  Procedure Laterality Date   BIOPSY  04/21/2020   Procedure: BIOPSY;  Surgeon: Irving Copas., MD;  Location: [place];  Service: Gastroenterology;;   BIOPSY  08/17/2020   Procedure: BIOPSY;  Surgeon: Carol Ada, MD;  Location[facility] ENDOSCOPY;  Service: Endoscopy;;   COLONOSCOPY     COLONOSCOPY     multiple   ESOPHAGOGASTRODUODENOSCOPY N/A 12/20/2020   Procedure: ESOPHAGOGASTRODUODENOSCOPY (EGD);  Surgeon: Virgel Manifold, MD;  Location: [facility] ENDOSCOPY;  Service: Endoscopy;  Laterality: N/A;   ESOPHAGOGASTRODUODENOSCOPY (EGD) WITH PROPOFOL N/A 01/31/2020   Procedure: ESOPHAGOGASTRODUODENOSCOPY (EGD) WITH PROPOFOL;  Surgeon: Doran Stabler, MD;  Location: [place];  Service: Gastroenterology;  Laterality: N/A;   ESOPHAGOGASTRODUODENOSCOPY (EGD) WITH PROPOFOL N/A 04/21/2020   Procedure: ESOPHAGOGASTRODUODENOSCOPY (EGD) WITH PROPOFOL;  Surgeon: Rush Landmark Telford Nab., MD;  Location: [place];  Service: Gastroenterology;  Laterality: N/A;   ESOPHAGOGASTRODUODENOSCOPY (EGD) WITH PROPOFOL N/A 08/17/2020   Procedure: ESOPHAGOGASTRODUODENOSCOPY (EGD) WITH PROPOFOL;  Surgeon: Carol Ada, MD;  Location: [place];  Service: Endoscopy;  Laterality: N/A;  EUS N/A 04/21/2020   Procedure: UPPER ENDOSCOPIC ULTRASOUND (EUS) LINEAR;  Surgeon: Irving Copas., MD;  Location: [place];  Service: Gastroenterology;  Laterality: N/A;   [place]   left men. repair   KNEE SURGERY  1975   REVERSE SHOULDER ARTHROPLASTY Right 06/21/2018   Procedure: REVERSE SHOULDER  ARTHROPLASTY;  Surgeon: Hiram Gash, MD;  Location: [facility];  Service: Orthopedics;  Laterality: Right;   ROTATOR CUFF REPAIR     right   ROTATOR CUFF REPAIR     TONSILLECTOMY     TOTAL SHOULDER REPLACEMENT  1610   UMBILICAL HERNIA REPAIR       reports that he has been smoking cigarettes. He has a 25.00 pack-year smoking history. He has never used smokeless tobacco. He reports current alcohol use of about 14.0 standard drinks per week. He reports that he does not currently use drugs after having used the following drugs: Marijuana.  Allergies  Allergen Reactions   Atorvastatin     Other reaction(s): joints ache   Doxycycline     Other reaction(s): rash/hive    Family History  Problem Relation Age of Onset   Heart failure Mother    Lung cancer Father    Colon cancer Neg Hx    Esophageal cancer Neg Hx    Stomach cancer Neg Hx    Liver cancer Neg Hx    Pancreatic cancer Neg Hx    Rectal cancer Neg Hx    Inflammatory bowel disease Neg Hx       Prior to Admission medications   Medication Sig Start Date End Date Taking? Authorizing Provider  albuterol (PROVENTIL) (2.5 MG/3ML) 0.083% nebulizer solution Take 3 mLs (2.5 mg total) by nebulization every 6 (six) hours as needed for wheezing. Patient not taking: Reported on 12/18/2020 02/05/20   Debbe Odea, MD  allopurinol (ZYLOPRIM) 100 MG tablet Take 100 mg by mouth daily. 08/09/18   [provider]  arformoterol (BROVANA) 15 MCG/2ML NEBU Take 2 mLs (15 mcg total) by nebulization 2 (two) times daily. Patient not taking: Reported on 12/18/2020 08/29/20   Patrecia Pour, MD  budesonide (PULMICORT) 0.25 MG/2ML nebulizer solution Take 2 mLs (0.25 mg total) by nebulization 2 (two) times daily. Patient not taking: Reported on 12/18/2020 08/29/20   Patrecia Pour, MD  carvedilol (COREG) 3.125 MG tablet Take 1 tablet (3.125 mg total) by mouth 2 (two) times daily. Do not take is Systolic BP is <960 4/54/09   Pokhrel, Corrie Mckusick, MD  docusate  sodium (COLACE) 100 MG capsule Take 1 capsule (100 mg total) by mouth 2 (two) times daily as needed for mild constipation. 08/25/20   Pokhrel, Corrie Mckusick, MD  famotidine (PEPCID) 40 MG tablet Take 1 tablet by mouth at bedtime. Patient not taking: Reported on 12/18/2020    [provider]  folic acid (FOLVITE) 1 MG tablet Take 1 mg by mouth daily. 12/03/19   [provider]  furosemide (LASIX) 40 MG tablet Take 1 tablet (40 mg total) by mouth daily. 02/05/20 02/04/21  Debbe Odea, MD  guaiFENesin-dextromethorphan (ROBITUSSIN DM) 100-10 MG/5ML syrup Take 10 mLs by mouth every 6 (six) hours as needed for cough. Patient not taking: Reported on 12/18/2020 08/25/20   Flora Lipps, MD  losartan (COZAAR) 25 MG tablet Take 0.5 tablets (12.5 mg total) by mouth daily. DO not take if systolic blood pressure <811 08/25/20   Pokhrel, Corrie Mckusick, MD  Multiple Vitamin (MULTIVITAMIN) tablet Take 1  tablet by mouth daily.  Patient not taking: Reported on 12/18/2020    [provider]  nicotine (NICODERM CQ - DOSED IN MG/24 HOURS) 14 mg/24hr patch Place 1 patch (14 mg total) onto the skin daily. Patient not taking: Reported on 12/18/2020 08/26/20   Flora Lipps, MD  pantoprazole (PROTONIX) 40 MG tablet Take 1 tablet (40 mg total) by mouth 2 (two) times daily for 14 days, THEN 1 tablet (40 mg total) daily. 12/24/20 03/18/21  Pokhrel, Corrie Mckusick, MD  revefenacin (YUPELRI) 175 MCG/3ML nebulizer solution Take 3 mLs (175 mcg total) by nebulization daily. Patient not taking: Reported on 12/18/2020 08/30/20   Patrecia Pour, MD  thiamine 100 MG tablet Take 1 tablet (100 mg total) by mouth daily. Patient not taking: Reported on 12/18/2020 08/26/20   Flora Lipps, MD    Physical Exam: Vitals:   01/19/21 0033 01/19/21 0037 01/19/21 0230 01/19/21 0235  BP: 103/66  (!) 82/42 96/85  Pulse: 99  76 94  Resp: 18  19 16   Temp: 97.9 F (36.6 C)     TempSrc: Oral     SpO2: 90%  97% 90%  Weight:  68 kg    Height:  5'  5" (1.651 m)       Vitals:   01/19/21 0033 01/19/21 0037 01/19/21 0230 01/19/21 0235  BP: 103/66  (!) 82/42 96/85  Pulse: 99  76 94  Resp: 18  19 16   Temp: 97.9 F (36.6 C)     TempSrc: Oral     SpO2: 90%  97% 90%  Weight:  68 kg    Height:  5\' 5"  (1.651 m)      Constitutional: Alert and oriented x 3 . Not in any apparent distress HEENT:      Head: Laceration mid upper frontal scalp 3 cm and mid left lower scalp 2 cm      Eyes: PERLA, EOMI, Conjunctivae are normal. Sclera is non-icteric.       Mouth/Throat: Mucous membranes are moist.       Neck: Supple with no signs of meningismus. Cardiovascular: Regular rate and rhythm. No murmurs, gallops, or rubs. 2+ symmetrical distal pulses are present . No JVD. No LE edema Respiratory: Respiratory effort normal .Lungs sounds clear bilaterally. No wheezes, crackles, or rhonchi.  Gastrointestinal: Soft, non tender, and non distended with positive bowel sounds.  Genitourinary: No CVA tenderness. Musculoskeletal: Nontender with normal range of motion in all extremities. No cyanosis, or erythema of extremities. Neurologic:  Face is symmetric. Moving all extremities. No gross focal neurologic deficits . Skin: Skin is warm, dry.  No rash or ulcers Psychiatric: Mood and affect are normal    Labs on Admission: I have personally reviewed following labs and imaging studies  CBC: Recent Labs  Lab 01/19/21 0039 01/19/21 0236  WBC 5.8 6.9  NEUTROABS 3.4 4.0  HGB 10.5* 10.7*  HCT 28.1* 28.7*  MCV 105.6* 105.5*  PLT 51* 53*   Basic Metabolic Panel: Recent Labs  Lab 01/19/21 0039 01/19/21 0236  NA 125* 126*  K 2.7* 2.7*  CL 87* 87*  CO2 22 21*  GLUCOSE 100* 101*  BUN 10 10  CREATININE 0.69 0.80  CALCIUM 7.5* 7.6*  MG 1.1*  --   PHOS  --  2.5   GFR: Estimated Creatinine Clearance: 77.9 mL/min (by C-G formula based on SCr of 0.8 mg/dL). Liver Function Tests: Recent Labs  Lab 01/19/21 0039  AST 95*  ALT 39  ALKPHOS 129*   BILITOT  1.7*  PROT 6.3*  ALBUMIN 3.1*   No results for input(s): LIPASE, AMYLASE in the last 168 hours. No results for input(s): AMMONIA in the last 168 hours. Coagulation Profile: No results for input(s): INR, PROTIME in the last 168 hours. Cardiac Enzymes: No results for input(s): CKTOTAL, CKMB, CKMBINDEX, TROPONINI in the last 168 hours. BNP (last 3 results) No results for input(s): PROBNP in the last 8760 hours. HbA1C: No results for input(s): HGBA1C in the last 72 hours. CBG: No results for input(s): GLUCAP in the last 168 hours. Lipid Profile: No results for input(s): CHOL, HDL, LDLCALC, TRIG, CHOLHDL, LDLDIRECT in the last 72 hours. Thyroid Function Tests: No results for input(s): TSH, T4TOTAL, FREET4, T3FREE, THYROIDAB in the last 72 hours. Anemia Panel: No results for input(s): VITAMINB12, FOLATE, FERRITIN, TIBC, IRON, RETICCTPCT in the last 72 hours. Urine analysis:    Component Value Date/Time   COLORURINE AMBER (A) 12/18/2020 0817   APPEARANCEUR HAZY (A) 12/18/2020 0817   LABSPEC 1.018 12/18/2020 0817   PHURINE 6.0 12/18/2020 0817   GLUCOSEU NEGATIVE 12/18/2020 0817   HGBUR MODERATE (A) 12/18/2020 0817   BILIRUBINUR NEGATIVE 12/18/2020 0817   KETONESUR 80 (A) 12/18/2020 0817   PROTEINUR 100 (A) 12/18/2020 0817   UROBILINOGEN 1.0 07/21/2008 0647   NITRITE NEGATIVE 12/18/2020 0817   LEUKOCYTESUR NEGATIVE 12/18/2020 0817    Radiological Exams on Admission: DG Chest 2 View  Result Date: 01/19/2021 CLINICAL DATA:  Hypoxia following fall, initial encounter EXAM: CHEST - 2 VIEW COMPARISON:  12/21/2020 FINDINGS: Cardiac shadow is stable. Lungs are well aerated bilaterally. Minimal scarring is seen in the bases improved from the prior exam. Postsurgical changes in right shoulder are noted. No acute bony abnormality is seen. IMPRESSION: No acute abnormality noted. Electronically Signed   By: Inez Catalina M.D.   On: 01/19/2021 01:13   CT Head Wo Contrast  Result  Date: 01/19/2021 CLINICAL DATA:  Fall EXAM: CT HEAD WITHOUT CONTRAST TECHNIQUE: Contiguous axial images were obtained from the base of the skull through the vertex without intravenous contrast. COMPARISON:  None. FINDINGS: Brain: There is now an acute subdural hematoma along the anterior falx cerebri, measuring 4 mm in thickness. Component of hematoma over the right convexity measures 5 mm. Vascular: Atherosclerotic calcification of the vertebral and internal carotid arteries at the skull base. No abnormal hyperdensity of the major intracranial arteries or dural venous sinuses. Skull: Right frontal scalp hematoma.  No skull fracture. Sinuses/Orbits: Fluid in the right maxillary sinus. The orbits are normal. IMPRESSION: 1. Acute subdural hematoma along the anterior falx cerebri, measuring 4 mm in thickness. Component of hematoma over the right convexity measures 5 mm. 2. Right frontal scalp hematoma without skull fracture. Critical Value/emergent results were called by telephone at the time of interpretation on 01/19/2021 at 3:10 am to provider Hinda Kehr, who verbally acknowledged these results. Electronically Signed   By: Ulyses Jarred M.D.   On: 01/19/2021 03:10   CT Head Wo Contrast  Result Date: 01/19/2021 CLINICAL DATA:  Recent fall in the bathroom with forehead laceration, initial encounter EXAM: CT HEAD WITHOUT CONTRAST TECHNIQUE: Contiguous axial images were obtained from the base of the skull through the vertex without intravenous contrast. COMPARISON:  12/18/2020 FINDINGS: Brain: No evidence of acute infarction, hemorrhage, hydrocephalus, extra-axial collection or mass lesion/mass effect. Mild atrophic changes are noted. Vascular: No hyperdense vessel or unexpected calcification. Skull: Normal. Negative for fracture or focal lesion. Sinuses/Orbits: No acute finding. Chronic mucosal retention cyst is noted within the  right maxillary antrum. Other: Mild soft tissue swelling is noted in the left  forehead consistent with the recent injury. IMPRESSION: Chronic atrophic changes without acute intracranial abnormality. Mild scalp swelling in the left forehead. Electronically Signed   By: Inez Catalina M.D.   On: 01/19/2021 01:05   CT Cervical Spine Wo Contrast  Result Date: 01/19/2021 CLINICAL DATA:  Fall EXAM: CT CERVICAL SPINE WITHOUT CONTRAST TECHNIQUE: Multidetector CT imaging of the cervical spine was performed without intravenous contrast. Multiplanar CT image reconstructions were also generated. COMPARISON:  None. FINDINGS: Alignment: No static subluxation. Facets are aligned. Occipital condyles and the lateral masses of C1 and C2 are normally approximated. Skull base and vertebrae: No acute fracture. Soft tissues and spinal canal: No prevertebral fluid or swelling. No visible canal hematoma. Disc levels: No advanced spinal canal or neural foraminal stenosis. Upper chest: No pneumothorax, pulmonary nodule or pleural effusion. Other: Normal visualized paraspinal cervical soft tissues. IMPRESSION: No acute fracture or static subluxation of the cervical spine. Electronically Signed   By: Ulyses Jarred M.D.   On: 01/19/2021 03:10   CT Cervical Spine Wo Contrast  Result Date: 01/19/2021 CLINICAL DATA:  Recent fall with neck pain, initial encounter EXAM: CT CERVICAL SPINE WITHOUT CONTRAST TECHNIQUE: Multidetector CT imaging of the cervical spine was performed without intravenous contrast. Multiplanar CT image reconstructions were also generated. COMPARISON:  09/01/2020 FINDINGS: Alignment: Limits. Skull base and vertebrae: 7 cervical segments are well visualized. Vertebral body height is well maintained. No acute fracture or acute facet abnormality is noted. Multilevel osteophytic changes, disc space narrowing and facet hypertrophic changes are noted worst at C4-5. mild anterolisthesis is noted of C4 on C5 stable from the prior exam. Soft tissues and spinal canal: Surrounding soft tissue structures  show vascular calcification. No soft tissue hematoma is noted. Upper chest: Visualized lung apices are within normal limits. Other: None. IMPRESSION: Multilevel degenerative change without acute abnormality. Electronically Signed   By: Inez Catalina M.D.   On: 01/19/2021 01:11     Assessment/Plan  Acute subdural hematoma secondary to fall Thrombocytopenia and coagulopathy, alcohol related -Secondary to fall while awaiting evaluation in the ED - CT head showing 4 mm subdural hematoma - Repeat CT head at 9 AM to assess for stability - Neurosurgery was consulted from the ED - Close neurologic checks in view of coagulopathy - Fall and aspiration precautions  Recurrent falls - Likely related to alcohol use disorder, dehydration - Monitor H&H given history of recent GI bleed September 2022 -Correct electrolyte abnormalities - PT OT consult    Laceration of forehead without complication   Hematoma of frontal scalp - Monitor for worsening bleeding given coagulopathy - Wound care  - Tetanus shot given in the ED    Hypokalemia - Likely related to home Lasix as well as alcohol use - Replete and monitor    Hypomagnesemia - Replete and monitor  Alcoholic liver disease ([place]) Elevated blood alcohol level Alcohol use disorder, moderate, dependence ([place]) - Patient with elevated AST, hyponatremia, thrombocytopenia, EtOH level 340 - CIWA withdrawal protocol  Hypotension with history of essential hypertension - Patient borderline hypotensive after fall - Hold furosemide, losartan and carvedilol - Gentle IV hydration - Monitor H&H  History of GI bleed - Hospitalized in September 2022 for GI bleed - Monitor H&H  Chronic diastolic heart failure - Euvolemic to dry - Holding blood pressure lowering medications at this time  COPD - Not acutely exacerbated - DuoNebs as needed    DVT prophylaxis:  SCDs due to coagulopathy Code Status: full code  Family Communication:  none  Disposition  Plan: Back to previous home environment Consults called: none  Status: Observation    Athena Masse MD [facility]     01/19/2021, 4:01 AM

## 2021-01-20 DIAGNOSIS — K2211 Ulcer of esophagus with bleeding: Secondary | ICD-10-CM | POA: Diagnosis present

## 2021-01-20 DIAGNOSIS — E222 Syndrome of inappropriate secretion of antidiuretic hormone: Secondary | ICD-10-CM | POA: Diagnosis present

## 2021-01-20 DIAGNOSIS — D62 Acute posthemorrhagic anemia: Secondary | ICD-10-CM | POA: Diagnosis not present

## 2021-01-20 DIAGNOSIS — D696 Thrombocytopenia, unspecified: Secondary | ICD-10-CM | POA: Diagnosis not present

## 2021-01-20 DIAGNOSIS — R339 Retention of urine, unspecified: Secondary | ICD-10-CM | POA: Diagnosis not present

## 2021-01-20 DIAGNOSIS — K254 Chronic or unspecified gastric ulcer with hemorrhage: Secondary | ICD-10-CM | POA: Diagnosis present

## 2021-01-20 DIAGNOSIS — D61818 Other pancytopenia: Secondary | ICD-10-CM | POA: Diagnosis present

## 2021-01-20 DIAGNOSIS — G9341 Metabolic encephalopathy: Secondary | ICD-10-CM | POA: Diagnosis present

## 2021-01-20 DIAGNOSIS — F10231 Alcohol dependence with withdrawal delirium: Secondary | ICD-10-CM | POA: Diagnosis present

## 2021-01-20 DIAGNOSIS — F10931 Alcohol use, unspecified with withdrawal delirium: Secondary | ICD-10-CM | POA: Diagnosis not present

## 2021-01-20 DIAGNOSIS — F10229 Alcohol dependence with intoxication, unspecified: Secondary | ICD-10-CM | POA: Diagnosis present

## 2021-01-20 DIAGNOSIS — I959 Hypotension, unspecified: Secondary | ICD-10-CM | POA: Diagnosis present

## 2021-01-20 DIAGNOSIS — S0003XA Contusion of scalp, initial encounter: Secondary | ICD-10-CM | POA: Diagnosis not present

## 2021-01-20 DIAGNOSIS — E876 Hypokalemia: Secondary | ICD-10-CM | POA: Diagnosis present

## 2021-01-20 DIAGNOSIS — F1020 Alcohol dependence, uncomplicated: Secondary | ICD-10-CM | POA: Diagnosis not present

## 2021-01-20 DIAGNOSIS — Y846 Urinary catheterization as the cause of abnormal reaction of the patient, or of later complication, without mention of misadventure at the time of the procedure: Secondary | ICD-10-CM | POA: Diagnosis not present

## 2021-01-20 DIAGNOSIS — Y92009 Unspecified place in unspecified non-institutional (private) residence as the place of occurrence of the external cause: Secondary | ICD-10-CM | POA: Diagnosis not present

## 2021-01-20 DIAGNOSIS — R338 Other retention of urine: Secondary | ICD-10-CM | POA: Diagnosis not present

## 2021-01-20 DIAGNOSIS — D539 Nutritional anemia, unspecified: Secondary | ICD-10-CM | POA: Diagnosis present

## 2021-01-20 DIAGNOSIS — S0101XA Laceration without foreign body of scalp, initial encounter: Secondary | ICD-10-CM | POA: Diagnosis present

## 2021-01-20 DIAGNOSIS — T8383XA Hemorrhage of genitourinary prosthetic devices, implants and grafts, initial encounter: Secondary | ICD-10-CM | POA: Diagnosis not present

## 2021-01-20 DIAGNOSIS — S065XAA Traumatic subdural hemorrhage with loss of consciousness status unknown, initial encounter: Secondary | ICD-10-CM | POA: Diagnosis present

## 2021-01-20 DIAGNOSIS — E8729 Other acidosis: Secondary | ICD-10-CM | POA: Diagnosis present

## 2021-01-20 DIAGNOSIS — N179 Acute kidney failure, unspecified: Secondary | ICD-10-CM | POA: Diagnosis present

## 2021-01-20 DIAGNOSIS — I110 Hypertensive heart disease with heart failure: Secondary | ICD-10-CM | POA: Diagnosis present

## 2021-01-20 DIAGNOSIS — Y831 Surgical operation with implant of artificial internal device as the cause of abnormal reaction of the patient, or of later complication, without mention of misadventure at the time of the procedure: Secondary | ICD-10-CM | POA: Diagnosis not present

## 2021-01-20 DIAGNOSIS — T8459XA Infection and inflammatory reaction due to other internal joint prosthesis, initial encounter: Secondary | ICD-10-CM | POA: Diagnosis not present

## 2021-01-20 DIAGNOSIS — I5032 Chronic diastolic (congestive) heart failure: Secondary | ICD-10-CM | POA: Diagnosis present

## 2021-01-20 DIAGNOSIS — K7010 Alcoholic hepatitis without ascites: Secondary | ICD-10-CM | POA: Diagnosis present

## 2021-01-20 DIAGNOSIS — R000 Tachycardia, unspecified: Secondary | ICD-10-CM

## 2021-01-20 DIAGNOSIS — K766 Portal hypertension: Secondary | ICD-10-CM | POA: Diagnosis present

## 2021-01-20 DIAGNOSIS — W1811XA Fall from or off toilet without subsequent striking against object, initial encounter: Secondary | ICD-10-CM | POA: Diagnosis present

## 2021-01-20 DIAGNOSIS — D689 Coagulation defect, unspecified: Secondary | ICD-10-CM | POA: Diagnosis present

## 2021-01-20 DIAGNOSIS — K709 Alcoholic liver disease, unspecified: Secondary | ICD-10-CM | POA: Diagnosis not present

## 2021-01-20 DIAGNOSIS — U071 COVID-19: Secondary | ICD-10-CM | POA: Diagnosis not present

## 2021-01-20 DIAGNOSIS — J449 Chronic obstructive pulmonary disease, unspecified: Secondary | ICD-10-CM | POA: Diagnosis present

## 2021-01-20 DIAGNOSIS — K921 Melena: Secondary | ICD-10-CM

## 2021-01-20 DIAGNOSIS — I5022 Chronic systolic (congestive) heart failure: Secondary | ICD-10-CM

## 2021-01-20 DIAGNOSIS — Z23 Encounter for immunization: Secondary | ICD-10-CM | POA: Diagnosis present

## 2021-01-20 DIAGNOSIS — R310 Gross hematuria: Secondary | ICD-10-CM | POA: Diagnosis not present

## 2021-01-20 DIAGNOSIS — Y908 Blood alcohol level of 240 mg/100 ml or more: Secondary | ICD-10-CM | POA: Diagnosis present

## 2021-01-20 LAB — BASIC METABOLIC PANEL
Anion gap: 4 — ABNORMAL LOW (ref 5–15)
BUN: 8 mg/dL (ref 8–23)
CO2: 29 mmol/L (ref 22–32)
Calcium: 7.2 mg/dL — ABNORMAL LOW (ref 8.9–10.3)
Chloride: 100 mmol/L (ref 98–111)
Creatinine, Ser: 0.53 mg/dL — ABNORMAL LOW (ref 0.61–1.24)
GFR, Estimated: >60 mL/min (ref >60)
Glucose, Bld: 99 mg/dL (ref 70–99)
Potassium: 3.4 mmol/L — ABNORMAL LOW (ref 3.5–5.1)
Sodium: 133 mmol/L — ABNORMAL LOW (ref 135–145)

## 2021-01-20 LAB — PREPARE PLATELET PHERESIS: Unit division: 0

## 2021-01-20 LAB — CBC
HCT: 23.4 % — ABNORMAL LOW (ref 39.0–52.0)
HCT: 22.9 % — ABNORMAL LOW (ref 39.0–52.0)
Hemoglobin: 8.1 g/dL — ABNORMAL LOW (ref 13.0–17.0)
Hemoglobin: 8.2 g/dL — ABNORMAL LOW (ref 13.0–17.0)
MCH: 38 pg — ABNORMAL HIGH (ref 26.0–34.0)
MCH: 39.8 pg — ABNORMAL HIGH (ref 26.0–34.0)
MCHC: 34.6 g/dL (ref 30.0–36.0)
MCHC: 35.8 g/dL (ref 30.0–36.0)
MCV: 109.9 fL — ABNORMAL HIGH (ref 80.0–100.0)
MCV: 111.2 fL — ABNORMAL HIGH (ref 80.0–100.0)
Platelets: 29 10*3/uL — CL (ref 150–400)
Platelets: 49 10*3/uL — ABNORMAL LOW (ref 150–400)
RBC: 2.13 MIL/uL — ABNORMAL LOW (ref 4.22–5.81)
RBC: 2.06 MIL/uL — ABNORMAL LOW (ref 4.22–5.81)
RDW: 19.5 % — ABNORMAL HIGH (ref 11.5–15.5)
RDW: 19.5 % — ABNORMAL HIGH (ref 11.5–15.5)
WBC: 4 10*3/uL (ref 4.0–10.5)
WBC: 4.4 10*3/uL (ref 4.0–10.5)
nRBC: 0 % (ref 0.0–0.2)
nRBC: 0 % (ref 0.0–0.2)

## 2021-01-20 LAB — MAGNESIUM: Magnesium: 1.5 mg/dL — ABNORMAL LOW (ref 1.7–2.4)

## 2021-01-20 LAB — BPAM PLATELET PHERESIS
Blood Product Expiration Date: 202210192359
ISSUE DATE / TIME: 202210171504
Unit Type and Rh: 6200

## 2021-01-20 LAB — APTT: aPTT: 30 seconds (ref 24–36)

## 2021-01-20 LAB — PROTIME-INR
INR: 1.1 (ref 0.8–1.2)
Prothrombin Time: 14.2 seconds (ref 11.4–15.2)

## 2021-01-20 MED ORDER — MAGNESIUM SULFATE 4 GM/100ML IV SOLN
4.0000 g | Freq: Once | INTRAVENOUS | Status: AC
  Administered 2021-01-20: 4 g via INTRAVENOUS
  Filled 2021-01-20: qty 100

## 2021-01-20 MED ORDER — IPRATROPIUM-ALBUTEROL 0.5-2.5 (3) MG/3ML IN SOLN
3.0000 mL | Freq: Two times a day (BID) | RESPIRATORY_TRACT | Status: DC
Start: 2021-01-20 — End: 2021-01-21
  Administered 2021-01-20 – 2021-01-21 (×3): 3 mL via RESPIRATORY_TRACT
  Filled 2021-01-20 (×3): qty 3

## 2021-01-20 MED ORDER — CARVEDILOL 6.25 MG PO TABS
6.2500 mg | ORAL_TABLET | Freq: Two times a day (BID) | ORAL | Status: DC
  Administered 2021-01-21 – 2021-01-25 (×6): 6.25 mg via ORAL
  Filled 2021-01-20 (×7): qty 1

## 2021-01-20 MED ORDER — DILTIAZEM HCL ER COATED BEADS 180 MG PO CP24
180.0000 mg | ORAL_CAPSULE | Freq: Every day | ORAL | Status: DC
  Administered 2021-01-20: 180 mg via ORAL
  Filled 2021-01-20: qty 1

## 2021-01-20 MED ORDER — MAGNESIUM SULFATE 2 GM/50ML IV SOLN: 2.0000 g | Freq: Once | INTRAVENOUS | Status: DC

## 2021-01-20 MED ORDER — POTASSIUM CHLORIDE CRYS ER 20 MEQ PO TBCR
40.0000 meq | EXTENDED_RELEASE_TABLET | Freq: Once | ORAL | Status: AC
  Administered 2021-01-20: 09:00:00 40 meq via ORAL
  Filled 2021-01-20: qty 2

## 2021-01-20 MED ORDER — ZOLPIDEM TARTRATE 5 MG PO TABS
5.0000 mg | ORAL_TABLET | Freq: Every evening | ORAL | Status: DC | PRN
  Administered 2021-01-21: 5 mg via ORAL
  Filled 2021-01-20: qty 1

## 2021-01-20 MED ORDER — SODIUM CHLORIDE 0.9% IV SOLUTION: Freq: Once | INTRAVENOUS | Status: AC

## 2021-01-20 MED ORDER — CALCIUM GLUCONATE-NACL 2-0.675 GM/100ML-% IV SOLN
2.0000 g | Freq: Once | INTRAVENOUS | Status: AC
  Administered 2021-01-20: 2000 mg via INTRAVENOUS
  Filled 2021-01-20: qty 100

## 2021-01-20 NOTE — TOC Initial Note (Signed)
Transition of Care [facility]) - Initial/Assessment Note    Patient Details  Name: Fernando [name] MRN: [id number] Date of Birth: [birth year]  Transition of Care [facility]) CM/SW Contact:    Pete Pelt, RN Phone Number: 01/20/2021, 2:46 PM  Clinical Narrative:    Patient lives alone, son lives in [name].  He states his neighbors can help him and his son has a friend locally that can help.  Therapy recommendations include SNF, however patient refuses SNF at this time.  He is amenable to Home Health and is currently established with [facility] for PT and RN as per Corene Cornea at [facility]..    Patient states that [facility] is setting him up with Meals on Wheels.  Discussed Life Alert with patient, and he stated that money is a concern.  Provided Life Alert information for patient to look into.  Patient states he drives himself to appointments, and does not feel his head wound should prohibit him from driving because "it is a superficial wound".  RNCM directed patient to hospitalist to discuss such restrictions, if they apply in his case and to follow any and all instructions.  Patient did not respond when asked if there was another driver that could provide transportation to appointments.  [facility] and denies concerns with taking medications as directed.  Physical therapy to work with patient this afternoon and follow up with recommendations for discharge.  TOC contact information provided to patient.  TOC to follow to discharge.         Expected Discharge Plan: [place] (SNF recommended, patient declined) Barriers to Discharge: Continued Medical Work up   Patient Goals and CMS Choice     Choice offered to / list presented to : NA  Expected Discharge Plan and Services Expected Discharge Plan: [place] (SNF recommended, patient declined)   Discharge Planning Services: CM Consult Post Acute Care  Choice: [place] arrangements for the past 2 months: Single Family Home                                      Prior Living Arrangements/Services Living arrangements for the past 2 months: Single Family Home Lives with:: Self Patient language and need for interpreter reviewed:: Yes (Patient does not require interpreter.) Do you feel safe going back to the place where you live?: Yes      Need for Family Participation in Patient Care: Yes (Comment)     Criminal Activity/Legal Involvement Pertinent to Current Situation/Hospitalization: No - Comment as needed  Activities of Daily Living Home Assistive Devices/Equipment: Walker (specify type) ADL Screening (condition at time of admission) Patient's cognitive ability adequate to safely complete daily activities?: Yes Is the patient deaf or have difficulty hearing?: No Does the patient have difficulty seeing, even when wearing glasses/contacts?: Yes Does the patient have difficulty concentrating, remembering, or making decisions?: No Patient able to express need for assistance with ADLs?: No Does the patient have difficulty dressing or bathing?: No Independently performs ADLs?: No Does the patient have difficulty walking or climbing stairs?: No Weakness of Legs: Both Weakness of Arms/Hands: None  Permission Sought/Granted Permission sought to share information with : Case Manager       Permission granted to share info w AGENCY: [facility] for RN and PT  Emotional Assessment Appearance:: Appears stated age Attitude/Demeanor/Rapport: Apprehensive Affect (typically observed): Appropriate Orientation: : Oriented to Self, Oriented to Place, Oriented to  Time, Oriented to Situation Alcohol / Substance Use: Not Applicable Psych Involvement: No (comment)  Admission diagnosis:  Hypokalemia [K34.9] Alcoholic ketoacidosis [Z79.15] Hypomagnesemia [E83.42] Alcohol abuse [F10.10] Subdural hematoma  [S06.5XAA] Fall, initial encounter [id number]] Laceration of scalp, initial encounter [S01.01XA] Hematoma of frontal scalp, initial encounter [A56.97XY] Alcoholic intoxication with complication ([place]) [I01.655] Patient Active Problem List   Diagnosis Date Noted   Hypokalemia 01/19/2021   Hypomagnesemia 01/19/2021   Laceration of forehead 01/19/2021   Acute subdural hematoma 01/19/2021   Hematoma of frontal scalp 01/19/2021   History of GI bleed 01/19/2021   Subdural hematoma 01/19/2021   Alcohol abuse    Elevated AST (SGOT)    Malnutrition of moderate degree 37/48/2707   Alcoholic ketoacidosis 86/75/4492   Chronic diastolic CHF (congestive heart failure) ([place]) 01/00/7121   Alcoholic cirrhosis of liver without ascites ([place])    Acute GI bleeding 08/16/2020   Abnormal CT of the abdomen 05/16/2020   Pancreatic disease 05/16/2020   Cyst of pancreas 05/16/2020   Acute on chronic combined systolic and diastolic CHF (congestive heart failure) ([place]) 02/05/2020   Anemia of chronic disease    Alcoholic liver disease ([place])    Portal hypertensive gastropathy (HCC)    Pancreatic mass    Sinus tachycardia    AKI (acute kidney injury) ([place]) 01/27/2020   Fall at home, initial encounter 01/26/2020   Hypotension 01/26/2020   Tobacco dependence 01/26/2020   Dislocation of prosthetic shoulder joint, initial encounter [facility])    Essential hypertension    Dyslipidemia    Alcohol use disorder, moderate, dependence ([place])    Pneumonitis    Generalized weakness 97/58/8325   Alcoholic hepatitis 49/82/6415   Sinus tachycardia 10/25/2019   Hyperammonemia ([place]) 10/25/2019   Alcohol withdrawal ([place]) 10/25/2019   Acute hypoxemic respiratory failure ([place]) 10/25/2019   Macrocytic anemia 10/25/2019   Thrombocytopenia ([place]) 10/25/2019   Gout    Tobacco abuse    Stasis dermatitis of both legs 09/04/2018   Medication monitoring encounter 08/09/2018   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 [place] [address] Phone: [phone] Fax: [phone]     Social Determinants of Health (SDOH) Interventions    Readmission Risk Interventions Readmission Risk Prevention Plan 12/19/2020  Transportation Screening Complete  PCP or Specialist Appt within 3-5 Days Complete  Social Work Consult for [employer] Planning/Counseling Complete  Palliative Care Screening Not Applicable  Medication Review Press photographer) Complete  Some recent data might be hidden

## 2021-01-20 NOTE — Progress Notes (Signed)
Patient ID: Fernando [name], male   DOB: 1955/04/16, 65 y.o.   MRN: [id number] [facility] PROGRESS NOTE  Fernando [name] XLK:[id number] DOB: 08-12-1955 DOA: 01/19/2021 PCP: [name], MD  HPI/Subjective: Patient feels okay.  Offers no complaints.  Initially admitted with fall and found to have a subdural hematoma.  Then started having black stools.  Platelet transfusion was ordered yesterday but not given until today.  Notified that he is tachycardic when standing up.  Objective: Vitals:   01/20/21 1401 01/20/21 1639  BP:  123/80  Pulse: (!) 102 (!) 120  Resp:  18  Temp:  98 F (36.7 C)  SpO2:  99%    Intake/Output Summary (Last 24 hours) at 01/20/2021 1720 Last data filed at 01/20/2021 1500 Gross per 24 hour  Intake 845 ml  Output 3300 ml  Net -2455 ml   Filed Weights   01/19/21 0037  Weight: 68 kg    ROS: Review of Systems  Respiratory:  Negative for shortness of breath.   Cardiovascular:  Negative for chest pain.  Gastrointestinal:  Negative for abdominal pain, nausea and vomiting.  Exam: Physical Exam HENT:     Head: Normocephalic.     Mouth/Throat:     Pharynx: No oropharyngeal exudate.  Eyes:     General: Lids are normal.     Conjunctiva/sclera: Conjunctivae normal.     Pupils: Pupils are equal, round, and reactive to light.  Cardiovascular:     Rate and Rhythm: Regular rhythm. Tachycardia present.     Heart sounds: Normal heart sounds, S1 normal and S2 normal.  Pulmonary:     Breath sounds: No decreased breath sounds, wheezing, rhonchi or rales.  Abdominal:     Palpations: Abdomen is soft.     Tenderness: There is no abdominal tenderness.  Musculoskeletal:     Right lower leg: No swelling.     Left lower leg: No swelling.  Skin:    General: Skin is warm.     Comments: Dried blood on forehead.  Sutures on forehead.  Bruising on arms.  Neurological:     Mental Status: He is alert.     Comments: Patient answers all questions appropriately       Scheduled Meds:  acetaminophen  650 mg Oral Once   allopurinol  100 mg Oral Daily   budesonide (PULMICORT) nebulizer solution  0.5 mg Nebulization BID   cephALEXin  500 mg Oral Q8H   diltiazem  180 mg Oral Daily   folic acid  1 mg Oral Daily   ipratropium-albuterol  3 mL Nebulization BID   LORazepam  0-4 mg Intravenous Q6H   Or   LORazepam  0-4 mg Oral Q6H   [START ON 01/21/2021] LORazepam  0-4 mg Intravenous Q12H   Or   [START ON 01/21/2021] LORazepam  0-4 mg Oral Q12H   multivitamin with minerals  1 tablet Oral Daily   pantoprazole  40 mg Intravenous Q12H   thiamine  100 mg Oral Daily   Or   thiamine  100 mg Intravenous Daily   Brief history: 65 year old man with chronic systolic congestive heart failure, essential hypertension, hyperlipidemia, and alcohol abuse.  He presented after a fall and was able to crawl to his phone to call 911.  He was found to have a large laceration on his forehead which was sutured in the emergency room and a subdural hematoma.  Sutures will have to be removed in about 6 days.  Later developed multiple black bowel movements.  I ordered a platelet transfusion on 01/19/2021 but they did not get here until 01/20/2021.  Patient also has severe hypomagnesemia and hypokalemia on presentation and electrolytes were replaced.  Assessment/Plan:  Thrombocytopenia.  Platelet count this morning dipped down to 27.  Given 2 units of platelets and platelet count up to 49 after transfusion.  Recheck counts daily. Severe hypomagnesemia.  Magnesium still low today at 1.5.  We will give another 4 g of magnesium Subdural hematoma.  Seen by neurosurgery and no further work-up and avoiding blood thinners. Hypokalemia.  Replace potassium orally. Melena.  Patient on IV Protonix.  Consulted GI.  Patient on full liquid diet.  Serial hemoglobins.  Transfuse if needed. Laceration forehead.  Empiric Keflex.  Patient sutured in the ER.  Will need suture removal in about 6  days. Alcohol abuse.  Thrombocytopenia.  Elevated AST.  Patient on alcohol withdrawal protocol.  Continue thiamine folic acid and multivitamin. Tachycardia with standing.  Restart Coreg Chronic systolic congestive heart failure.  Last ejection fraction normal range.  Restart Coreg COPD.  Wheezing better after starting nebulizer treatments yesterday.        Code Status:     Code Status Orders  (From admission, onward)           Start     Ordered   01/19/21 0442  Full code  Continuous        01/19/21 0443           Code Status History     Date Active Date Inactive Code Status Order ID Comments User Context   12/18/2020 1238 12/24/2020 1914 Full Code [id number]  Collier Bullock, MD ED   08/16/2020 2030 08/29/2020 1859 Full Code [id number]  Lafayette Dragon, MD ED   01/26/2020 0918 02/05/2020 2001 Full Code [id number]  Karmen Bongo, MD ED   10/25/2019 1243 11/01/2019 2239 Full Code [id number]  Mckinley Jewel, MD ED   06/21/2018 1248 06/22/2018 1327 Full Code [id number]  Hiram Gash, MD Inpatient   02/04/2018 1608 02/05/2018 2008 Full Code [id number]  Hillary Bow, MD ED      Advance Directive Documentation    Flowsheet Row Most Recent Value  Type of Advance Directive Living will  Pre-existing out of facility DNR order (yellow form or pink MOST form) --  "MOST" Form in Place? --      Family Communication: Updated son on the phone Disposition Plan: Status is: Inpatient  Consultants: Seen by neurosurgery Gastroenterology  Antibiotics: Keflex  Time spent: 28 minutes  [place]

## 2021-01-20 NOTE — Progress Notes (Signed)
[facility]astroenterology Inpatient Follow-up/Progress Note   Patient ID: Fernando [name] is a 65 y.o. male.  Overnight Events / Subjective Findings Patient resting in bed comfortably.  Per nursing he continues to have dark stools, but it sounds like less frequent.  His hemoglobin down trended to 9.1 after he has had bleeding and fluid resuscitation.  Platelets are down to 29,000 today.  He is notably pancytopenic.  Hemoglobin around 9 in September.  BUN/creatinine ratio remains normal.  He denies nausea vomiting and abdominal pain.  He still appears tremulous and is receiving IV Ativan per nursing.  No hematochezia or hematemesis.  No other acute gi complaints.  Review of Systems  Constitutional:  Negative for activity change, appetite change, chills, fatigue, fever and unexpected weight change.  HENT:  Negative for trouble swallowing and voice change.   Respiratory:  Negative for shortness of breath.   Cardiovascular:  Negative for chest pain and palpitations.  Gastrointestinal:  Positive for blood in stool (dark stools). Negative for abdominal distention, abdominal pain, anal bleeding, constipation, diarrhea, nausea and vomiting.  Musculoskeletal:  Negative for arthralgias and myalgias.  Skin:  Negative for color change and pallor.  Neurological:  Negative for dizziness, syncope and weakness.  All other systems reviewed and are negative.   Medications  Current Facility-Administered Medications:    0.9 %  sodium chloride infusion (Manually program via Guardrails IV Fluids), , Intravenous, Once, Smith, Rondell A, MD   acetaminophen (TYLENOL) tablet 650 mg, 650 mg, Oral, Q6H PRN **OR** acetaminophen (TYLENOL) suppository 650 mg, 650 mg, Rectal, Q6H PRN, Athena Masse, MD   acetaminophen (TYLENOL) tablet 650 mg, 650 mg, Oral, Once, Wieting, Richard, MD   allopurinol (ZYLOPRIM) tablet 100 mg, 100 mg, Oral, Daily, Leslye Peer, Richard, MD, 100 mg at 01/19/21 1528   budesonide (PULMICORT)  nebulizer solution 0.5 mg, 0.5 mg, Nebulization, BID, Leslye Peer, Richard, MD, 0.5 mg at 01/20/21 6720   calcium gluconate 2 g/ 100 mL sodium chloride IVPB, 2 g, Intravenous, Once, Fuller Plan A, MD, Last Rate: 100 mL/hr at 01/20/21 0649, 2,000 mg at 01/20/21 0649   cephALEXin (KEFLEX) capsule 500 mg, 500 mg, Oral, Q8H, Wieting, Richard, MD, 500 mg at 94/70/96 2836   folic acid (FOLVITE) tablet 1 mg, 1 mg, Oral, Daily, Judd Gaudier V, MD, 1 mg at 01/19/21 1116   HYDROcodone-acetaminophen (NORCO/VICODIN) 5-325 MG per tablet 1-2 tablet, 1-2 tablet, Oral, Q4H PRN, Athena Masse, MD, 2 tablet at 01/20/21 0108   ipratropium-albuterol (DUONEB) 0.5-2.5 (3) MG/3ML nebulizer solution 3 mL, 3 mL, Nebulization, Q6H, Wieting, Richard, MD, 3 mL at 01/20/21 0713   LORazepam (ATIVAN) injection 0-4 mg, 0-4 mg, Intravenous, Q6H, 1 mg at 01/19/21 2125 **OR** LORazepam (ATIVAN) tablet 0-4 mg, 0-4 mg, Oral, Q6H, Hinda Kehr, MD, 1 mg at 01/20/21 0503   [START ON 01/21/2021] LORazepam (ATIVAN) injection 0-4 mg, 0-4 mg, Intravenous, Q12H **OR** [START ON 01/21/2021] LORazepam (ATIVAN) tablet 0-4 mg, 0-4 mg, Oral, Q12H, Hinda Kehr, MD   magnesium sulfate IVPB 2 g 50 mL, 2 g, Intravenous, Once, Smith, Rondell A, MD   magnesium sulfate IVPB 4 g 100 mL, 4 g, Intravenous, Once, Wieting, Richard, MD   multivitamin with minerals tablet 1 tablet, 1 tablet, Oral, Daily, Judd Gaudier V, MD, 1 tablet at 01/19/21 1116   ondansetron (ZOFRAN) tablet 4 mg, 4 mg, Oral, Q6H PRN **OR** ondansetron (ZOFRAN) injection 4 mg, 4 mg, Intravenous, Q6H PRN, Athena Masse, MD   pantoprazole (PROTONIX) injection 40 mg, 40 mg,  Intravenous, Q12H, Loletha Grayer, MD, 40 mg at 01/20/21 0158   potassium chloride SA (KLOR-CON) CR tablet 40 mEq, 40 mEq, Oral, Once, Wieting, Richard, MD   thiamine tablet 100 mg, 100 mg, Oral, Daily **OR** thiamine (B-1) injection 100 mg, 100 mg, Intravenous, Daily, Hinda Kehr, MD, 100 mg at 01/19/21 0434    zolpidem (AMBIEN) tablet 5 mg, 5 mg, Oral, QHS PRN, Tamala Julian, Rondell A, MD  calcium gluconate 2,000 mg (01/20/21 0649)   magnesium sulfate bolus IVPB     magnesium sulfate bolus IVPB      acetaminophen **OR** acetaminophen, HYDROcodone-acetaminophen, ondansetron **OR** ondansetron (ZOFRAN) IV, zolpidem   Objective    Vitals:   01/19/21 1641 01/19/21 1935 01/19/21 2323 01/20/21 0456  BP: 108/69 113/68 108/61 121/61  Pulse: (!) 108 (!) 102 97 91  Resp: 20 18 18    Temp: 98.4 F (36.9 C) 98.2 F (36.8 C) 98.2 F (36.8 C) 97.7 F (36.5 C)  TempSrc:  Oral Oral Oral  SpO2: 94% 98% 95% 100%  Weight:      Height:         Physical Exam Vitals and nursing note reviewed.  Constitutional:      General: He is not in acute distress.    Appearance: Normal appearance. He is not toxic-appearing or diaphoretic.  HENT:     Head: Normocephalic and atraumatic.     Nose: Nose normal.     Mouth/Throat:     Mouth: Mucous membranes are moist.     Pharynx: Oropharynx is clear.  Eyes:     General: No scleral icterus.    Extraocular Movements: Extraocular movements intact.  Cardiovascular:     Rate and Rhythm: Regular rhythm. Tachycardia present.     Heart sounds: Normal heart sounds. No murmur heard.   No friction rub. No gallop.  Pulmonary:     Effort: Pulmonary effort is normal. No respiratory distress.     Breath sounds: Normal breath sounds. No wheezing, rhonchi or rales.  Abdominal:     General: Abdomen is flat. Bowel sounds are normal. There is no distension.     Palpations: Abdomen is soft.     Tenderness: There is no abdominal tenderness. There is no guarding or rebound.  Musculoskeletal:     Cervical back: Neck supple.     Right lower leg: No edema.     Left lower leg: No edema.  Skin:    General: Skin is warm and dry.     Coloration: Skin is not jaundiced or pale.  Neurological:     Mental Status: He is alert and oriented to person, place, and time.     Comments: tremulous    Psychiatric:        Thought Content: Thought content normal.        Judgment: Judgment normal.     Laboratory Data Recent Labs  Lab 01/19/21 0039 01/19/21 0236 01/19/21 1402 01/20/21 0453  WBC 5.8 6.9  --  4.0  HGB 10.5* 10.7* 9.1* 8.1*  HCT 28.1* 28.7*  --  23.4*  PLT 51* 53*  --  29*  NEUTOPHILPCT 57 58  --   --   LYMPHOPCT 29 30  --   --   MONOPCT 12 11  --   --   EOSPCT 0 0  --   --    Recent Labs  Lab 01/19/21 0039 01/19/21 0236 01/19/21 1402 01/20/21 0453  NA 125* 126*  --  133*  K 2.7* 2.7* 3.4* 3.4*  CL 87* 87*  --  100  CO2 22 21*  --  29  BUN 10 10  --  8  CREATININE 0.69 0.80  --  0.53*  CALCIUM 7.5* 7.6*  --  7.2*  PROT 6.3*  --   --   --   BILITOT 1.7*  --   --   --   ALKPHOS 129*  --   --   --   ALT 39  --   --   --   AST 95*  --   --   --   GLUCOSE 100* 101*  --  99   Recent Labs  Lab 01/20/21 0453  INR 1.1      Imaging Studies: DG Chest 2 View  Result Date: 01/19/2021 CLINICAL DATA:  Hypoxia following fall, initial encounter EXAM: CHEST - 2 VIEW COMPARISON:  12/21/2020 FINDINGS: Cardiac shadow is stable. Lungs are well aerated bilaterally. Minimal scarring is seen in the bases improved from the prior exam. Postsurgical changes in right shoulder are noted. No acute bony abnormality is seen. IMPRESSION: No acute abnormality noted. Electronically Signed   By: Inez Catalina M.D.   On: 01/19/2021 01:13   CT Head Wo Contrast  Result Date: 01/19/2021 CLINICAL DATA:  Follow-up of subdural hematoma EXAM: CT HEAD WITHOUT CONTRAST TECHNIQUE: Contiguous axial images were obtained from the base of the skull through the vertex without intravenous contrast. COMPARISON:  01/19/2021 at 2:46 a.m. FINDINGS: Brain: As compared to the exam of the same date at 2:46 a.m. there is no change in the appearance of subdural hematoma along the RIGHT frontal convexity and along the falx. Ventricles are stable in size, no signs of intraventricular hemorrhage with stable  appearance as well. No signs of new extra-axial hematoma. No midline shift. Hematoma in these areas measures between 3 and 5 mm. Signs of atrophy as before along with chronic microvascular ischemic change. Vascular: No hyperdense vessel or unexpected calcification. Skull: Normal. Negative for fracture or focal lesion. Sinuses/Orbits: Signs of RIGHT maxillary sinus disease as before. Other: RIGHT frontal scalp hematoma and laceration similar to previous imaging. IMPRESSION: No change in the appearance of subdural hematoma along the RIGHT frontal convexity and along the falx. No signs of new extra-axial collection. RIGHT frontal scalp hematoma and laceration as before. Signs of RIGHT maxillary sinus disease as before. Electronically Signed   By: Zetta Bills M.D.   On: 01/19/2021 10:57   CT Head Wo Contrast  Result Date: 01/19/2021 CLINICAL DATA:  Fall EXAM: CT HEAD WITHOUT CONTRAST TECHNIQUE: Contiguous axial images were obtained from the base of the skull through the vertex without intravenous contrast. COMPARISON:  None. FINDINGS: Brain: There is now an acute subdural hematoma along the anterior falx cerebri, measuring 4 mm in thickness. Component of hematoma over the right convexity measures 5 mm. Vascular: Atherosclerotic calcification of the vertebral and internal carotid arteries at the skull base. No abnormal hyperdensity of the major intracranial arteries or dural venous sinuses. Skull: Right frontal scalp hematoma.  No skull fracture. Sinuses/Orbits: Fluid in the right maxillary sinus. The orbits are normal. IMPRESSION: 1. Acute subdural hematoma along the anterior falx cerebri, measuring 4 mm in thickness. Component of hematoma over the right convexity measures 5 mm. 2. Right frontal scalp hematoma without skull fracture. Critical Value/emergent results were called by telephone at the time of interpretation on 01/19/2021 at 3:10 am to provider Hinda Kehr, who verbally acknowledged these results.  Electronically Signed   By: Ulyses Jarred  M.D.   On: 01/19/2021 03:10   CT Head Wo Contrast  Result Date: 01/19/2021 CLINICAL DATA:  Recent fall in the bathroom with forehead laceration, initial encounter EXAM: CT HEAD WITHOUT CONTRAST TECHNIQUE: Contiguous axial images were obtained from the base of the skull through the vertex without intravenous contrast. COMPARISON:  12/18/2020 FINDINGS: Brain: No evidence of acute infarction, hemorrhage, hydrocephalus, extra-axial collection or mass lesion/mass effect. Mild atrophic changes are noted. Vascular: No hyperdense vessel or unexpected calcification. Skull: Normal. Negative for fracture or focal lesion. Sinuses/Orbits: No acute finding. Chronic mucosal retention cyst is noted within the right maxillary antrum. Other: Mild soft tissue swelling is noted in the left forehead consistent with the recent injury. IMPRESSION: Chronic atrophic changes without acute intracranial abnormality. Mild scalp swelling in the left forehead. Electronically Signed   By: Inez Catalina M.D.   On: 01/19/2021 01:05   CT Cervical Spine Wo Contrast  Result Date: 01/19/2021 CLINICAL DATA:  Fall EXAM: CT CERVICAL SPINE WITHOUT CONTRAST TECHNIQUE: Multidetector CT imaging of the cervical spine was performed without intravenous contrast. Multiplanar CT image reconstructions were also generated. COMPARISON:  None. FINDINGS: Alignment: No static subluxation. Facets are aligned. Occipital condyles and the lateral masses of C1 and C2 are normally approximated. Skull base and vertebrae: No acute fracture. Soft tissues and spinal canal: No prevertebral fluid or swelling. No visible canal hematoma. Disc levels: No advanced spinal canal or neural foraminal stenosis. Upper chest: No pneumothorax, pulmonary nodule or pleural effusion. Other: Normal visualized paraspinal cervical soft tissues. IMPRESSION: No acute fracture or static subluxation of the cervical spine. Electronically Signed   By:  Ulyses Jarred M.D.   On: 01/19/2021 03:10   CT Cervical Spine Wo Contrast  Result Date: 01/19/2021 CLINICAL DATA:  Recent fall with neck pain, initial encounter EXAM: CT CERVICAL SPINE WITHOUT CONTRAST TECHNIQUE: Multidetector CT imaging of the cervical spine was performed without intravenous contrast. Multiplanar CT image reconstructions were also generated. COMPARISON:  09/01/2020 FINDINGS: Alignment: Limits. Skull base and vertebrae: 7 cervical segments are well visualized. Vertebral body height is well maintained. No acute fracture or acute facet abnormality is noted. Multilevel osteophytic changes, disc space narrowing and facet hypertrophic changes are noted worst at C4-5. mild anterolisthesis is noted of C4 on C5 stable from the prior exam. Soft tissues and spinal canal: Surrounding soft tissue structures show vascular calcification. No soft tissue hematoma is noted. Upper chest: Visualized lung apices are within normal limits. Other: None. IMPRESSION: Multilevel degenerative change without acute abnormality. Electronically Signed   By: Inez Catalina M.D.   On: 01/19/2021 01:11    Assessment:    # Melena- reported - egd last month with gastric erosions, esophagitis and gastropathy - similar findings in May of this year and October of last year - BUN/Cr ratio 12.5 - h pylori negaive on previous egd   # anemia- macrocytic - hgb comparable to where it was over the last year; - 2/2 to etoh bone marrow suppression - small decrease during hospitalization likely 2/2 head laceration and fluid resuscitation with hypotension   # subdural hematoma- 72mm on CT - 2/2 syncopal episode with head strike- with laceration   # thrombocytopenia- 29K - 2/2 etoh use; no imaging findings of cirrhosis on multiple scans- CT and [place] - rcd 1 pack plt yesterday; to receive another today   # EtOH abuse with withdrawal - level >300 on arrival # Alcoholic hepatitis - AST 95; ALT 39 # multiple electrolyte  abnormalities             -  hypo mag, hypokalemia hypo natremia # h/o esophagitis and gastric erosions on multiple egds # h/o pancreatic cysts # tobacco dependence # HFpEF    Plan:  - Recent EGD reviewed- given recent findings and significant thrombocytopenia, suspect he is potentially oozing from sites. These would not be treated endoscopically, but with ppi. With his SDH, would need to discuss with anesthesia and neurosurgery if appropriate candidate if endoscopy was eventually indicated - would continue bid ppi iv 40 mg - maintain two sites iv access - monitor h&h; transfusion and resuscitation as per primary team - CIWA protocol and supportive care as per primary team - electrolyte correction as per primary team - ok for clear liquids at this time- recommend high protein - will collect inr for maddrey scoring- would not recommend steroids with etoh hepatitis at this time given SDH - recommend nsaid avoidance - if patient becomes hemodynamically unstable with further signs of bleeding- recommend stat CTA   - can consider tagged rbc scan if continues to ooze, but likely would not change management   I personally performed the service.  Management of other medical comorbidities as per primary team  Thank you for allowing [place] to participate in this patient's care. Please don't hesitate to call if any questions or concerns arise.   [name], DO [facility]  Portions of the record may have been created with voice recognition software. Occasional wrong-word or 'sound-a-like' substitutions may have occurred due to the inherent limitations of voice recognition software.  Read the chart carefully and recognize, using context, where substitutions may have occurred.

## 2021-01-20 NOTE — Plan of Care (Signed)
Notified by nursing of patient's a.m. labs that appear to be downtrending including hemoglobin 8.1 and platelet count 29.  Given patient coming in for subdural ordered for transfusion of 2 packs of platelets.

## 2021-01-20 NOTE — Evaluation (Signed)
Physical Therapy Evaluation Patient Details Name: Fernando [name] MRN: [id number] DOB: June 06, 1955 Today's Date: 01/20/2021  History of Present Illness  65 y.o. male who presents by EMS for evaluation after a fall at home.  The patient says he remembers falling but does not remember exactly the circumstances. Pt had another fall in the emergency department bathroom and sustained another injury to his forehead. Pt found to have acute subdural hematoma. While admitted, pt with multiple bouts of black tarry diarrhea. Past medical history significant for diastolic CHF, alcohol use, alcoholic liver cirrhosis, hypertension, recent hospitalization from 9/15-9/21 with upper GI bleed.   Clinical Impression  Pt received supine in bed, agreeable to therapy and eager to stand. Pt HR monitored throughout session. At rest varied between 103-111 bpm during PLOF/conversation, with bed mobility and standing elevated to 165 bpm (greater than age-predicted HRmax). While sitting EOB without movement or speaking, HR did decrease to 120 bpm. Pt performed bed mobility with SUP and STS/side-steps with CGA due to mild unsteadiness on feet. Overall, session was limited due to tachycardia. Currently rec SNF, pending further evaluation of mobility with vitals stable - pt would like to return home. He did present with decreased strength in the RLE compared to the left. Sensation and coordination intact. Would benefit from skilled PT to address above deficits and promote optimal return to PLOF.      Recommendations for follow up therapy are one component of a multi-disciplinary discharge planning process, led by the attending physician.  Recommendations may be updated based on patient status, additional functional criteria and insurance authorization.  Follow Up Recommendations Supervision/Assistance - 24 hour;SNF (pending further evaluation)    Equipment Recommendations  Other (comment) (TBD at next venue of care)     Recommendations for Other Services       Precautions / Restrictions Precautions Precautions: Fall Restrictions Weight Bearing Restrictions: No Other Position/Activity Restrictions: Monitor HR - elevates >170bpm      Mobility  Bed Mobility Overal bed mobility: Needs Assistance Bed Mobility: Supine to Sit;Sit to Supine Rolling: Modified independent (Device/Increase time)   Supine to sit: Supervision;HOB elevated Sit to supine: Supervision   General bed mobility comments: Increased time and effort to sit up to EOB. Able to reposition supine in bed.    Transfers Overall transfer level: Needs assistance Equipment used: Rolling walker (2 wheeled) Transfers: Sit to/from Stand Sit to Stand: Min guard         General transfer comment: CGA to steady. No lifting required.  Ambulation/Gait Ambulation/Gait assistance: Min guard Gait Distance (Feet): 2 Feet Assistive device: Rolling walker (2 wheeled)       General Gait Details: side stepping at EOB, CGA to steady. Pt managed RW. Distance limited due to elevated HR.  Stairs            Wheelchair Mobility    Modified Rankin (Stroke Patients Only)       Balance Overall balance assessment: Needs assistance Sitting-balance support: No upper extremity supported;Feet supported Sitting balance-Leahy Scale: Good Sitting balance - Comments: Extensive time sitting EOB to allow HR to decrease. Pt remained balanced however did require UE support during hip flexion testing.   Standing balance support: Bilateral upper extremity supported;During functional activity Standing balance-Leahy Scale: Poor Standing balance comment: Pt requires CGA and BUE support on RW                             Pertinent Vitals/Pain Pain Assessment:  No/denies pain    Home Living Family/patient expects to be discharged to:: Private residence Living Arrangements: Alone Available Help at Discharge: Neighbor;Available  PRN/intermittently;Friend(s) Type of Home: House Home Access: Stairs to enter   [employer] of Steps: 1/2 step to enter Home Layout: Two level;Able to live on main level with bedroom/bathroom Home Equipment: Gilford Rile - 2 wheels;Shower seat - built in Additional Comments: Pt does not use supplemental O2 at baseline    Prior Function Level of Independence: Independent         Comments: Pt reports that he is independent with ADLs (including sponge-bathing) and functional mobility (without AD) at baseline. Pt reports that he drives and is able to prepare simple meals (i.e., using microwave to eat frozen meals). Reports 3 falls within past 6 months, however previous admissions indicate more than 3. All falls reported in the bathroom.     Hand Dominance   Dominant Hand: Right    Extremity/Trunk Assessment   Upper Extremity Assessment Upper Extremity Assessment: Generalized weakness    Lower Extremity Assessment Lower Extremity Assessment: Generalized weakness (RLE: 4+/5 hip flexion, 4/5 knee ext and DF. LLE: 4/5 hip flexion, 5/5 knee ext and DF. Coordination and LT sensation intact bilaterally.)       Communication      Cognition Arousal/Alertness: Awake/alert Behavior During Therapy: WFL for tasks assessed/performed Overall Cognitive Status: No family/caregiver present to determine baseline cognitive functioning                                 General Comments: Pt A&Ox4. Pt is questionable historian, as some aspects of PLOF differ from reports during previous encounters      General Comments General comments (skin integrity, edema, etc.): Attempted to obtain orthostatics (see flowsheet) however HR increased to 174 when standing. Pt returned to supine and HR returning to resting HR of 102.    Exercises     Assessment/Plan    PT Assessment Patient needs continued PT services  PT Problem List Decreased strength;Decreased mobility;Decreased safety  awareness;Decreased activity tolerance;Cardiopulmonary status limiting activity;Decreased balance       PT Treatment Interventions DME instruction;Therapeutic activities;Gait training;Therapeutic exercise;Patient/family education;Balance training;Functional mobility training;Neuromuscular re-education    PT Goals (Current goals can be found in the Care Plan section)  Acute Rehab PT Goals Patient Stated Goal: to return home    Frequency Min 2X/week   Barriers to discharge Decreased caregiver support has friends and neighbors to assist as needed. currently too unsteady on feet and unable to assess further standing mobility due to elevated HR    Co-evaluation               AM-PAC PT "6 Clicks" Mobility  Outcome Measure Help needed turning from your back to your side while in a flat bed without using bedrails?: None Help needed moving from lying on your back to sitting on the side of a flat bed without using bedrails?: None Help needed moving to and from a bed to a chair (including a wheelchair)?: A Little Help needed standing up from a chair using your arms (e.g., wheelchair or bedside chair)?: A Little Help needed to walk in hospital room?: A Little Help needed climbing 3-5 steps with a railing? : A Lot 6 Click Score: 19    End of Session Equipment Utilized During Treatment: Gait belt;Oxygen Activity Tolerance: Treatment limited secondary to medical complications (Comment) (elevated HR beyond age-predicted max with bed  mobility) Patient left: in bed;with call bell/phone within reach;with bed alarm set Nurse Communication: Mobility status;Precautions PT Visit Diagnosis: Unsteadiness on feet (R26.81);Other abnormalities of gait and mobility (R26.89);Difficulty in walking, not elsewhere classified (R26.2);Repeated falls (R29.6);Muscle weakness (generalized) (M62.81)    Time: 1438-8875 PT Time Calculation (min) (ACUTE ONLY): 32 min   Charges:   PT Evaluation $PT Eval Moderate  Complexity: 1 Mod PT Treatments $Therapeutic Activity: 8-22 mins $Neuromuscular Re-education: 8-22 mins        Patrina Levering PT, DPT 01/20/21 4:51 PM [phone]

## 2021-01-20 NOTE — Progress Notes (Signed)
   01/20/21 1639  Assess: MEWS Score  Temp 98 F (36.7 C)  BP 123/80  Pulse Rate (!) 120  Resp 18  SpO2 99 %  Assess: MEWS Score  MEWS Temp 0  MEWS Systolic 0  MEWS Pulse 2  MEWS RR 0  MEWS LOC 0  MEWS Score 2  MEWS Score Color Yellow  Assess: if the MEWS score is Yellow or Red  Were vital signs taken at a resting state? Yes  Focused Assessment Change from prior assessment (see assessment flowsheet)  Does the patient meet 2 or more of the SIRS criteria? No  MEWS guidelines implemented *See Row Information* Yes  Notify: Charge Nurse/RN  Name of Charge Nurse/RN Notified JO RN  Date Charge Nurse/RN Notified 01/20/21  Time Charge Nurse/RN Notified 1640  Notify: Provider  Provider Name/Title Dr Leslye Peer  Date Provider Notified 01/20/21  Time Provider Notified [phone]  Notification Type  (secure chat)  Notification Reason Change in status  Provider response See new orders  Date of Provider Response 01/20/21  Time of Provider Response 1642  Assess: SIRS CRITERIA  SIRS Temperature  0  SIRS Pulse 1  SIRS Respirations  0  SIRS WBC 0  SIRS Score Sum  1

## 2021-01-20 NOTE — Evaluation (Signed)
Occupational Therapy Evaluation Patient Details Name: Fernando [name] MRN: [id number] DOB: [birth year] Today's Date: 01/20/2021   History of Present Illness 65 y.o. male who presents by EMS for evaluation after a fall at home.  The patient says he remembers falling but does not remember exactly the circumstances. Pt had another fall in the emergency department bathroom and sustained another injury to his forehead. Pt found to have acute subdural hematoma. While admitted, pt with multiple bouts of black tarry diarrhea. Past medical history significant for diastolic CHF, alcohol use, alcoholic liver cirrhosis, hypertension, recent hospitalization from 9/15-9/21 with upper GI bleed.   Clinical Impression   Pt seen for OT evaluation this date. Upon arrival to room, pt seated upright in bed on 2L of supplemental O2 with resting HR 100s. Pt reported that he is independent with ADLs (including sponge-bathing) and functional mobility (without AD) at baseline. Pt reports that he drives and is able to prepare simple meals (i.e., using microwave to eat frozen meals). Pt endorses 3 falls within past 6 months, however previous admissions indicate more than 3 falls.  Pt currently presents with decreased strength and activity tolerance. Due to these functional impairments, pt requires SUPERVISION for bed mobility, SUPERVISION/SET-UP for seated UB ADLs, MOD-MAX A for sit>stand LB ADLs, and MOD A for sit>stand transfers. Attempted to obtain orthostatic vitals this date (see flowsheet) however HR 174 when standing. Further mobility deferred in setting of elevated HR and pt returned to supine (HR returned to resting HR of 102); RN informed and present at end of session. Following bed mobility, male purewick dislodged and this author assisted with change of linen and clothing. Pt would benefit from additional skilled OT services to maximize return to PLOF and minimize risk of future falls, injury, caregiver burden, and  readmission. Upon discharge, recommend SNF.         Recommendations for follow up therapy are one component of a multi-disciplinary discharge planning process, led by the attending physician.  Recommendations may be updated based on patient status, additional functional criteria and insurance authorization.   Follow Up Recommendations  SNF    Equipment Recommendations  Other (comment) (defer to next venue of care)       Precautions / Restrictions Precautions Precautions: Fall Restrictions Weight Bearing Restrictions: No      Mobility Bed Mobility Overal bed mobility: Needs Assistance Bed Mobility: Rolling;Supine to Sit;Sit to Supine Rolling: Modified independent (Device/Increase time)   Supine to sit: Supervision;HOB elevated Sit to supine: Supervision   General bed mobility comments: Requires increase time/effort.    Transfers Overall transfer level: Needs assistance Equipment used: Rolling walker (2 wheeled) Transfers: Sit to/from Stand Sit to Stand: Mod assist         General transfer comment: MOD A for steadying upon standing    Balance Overall balance assessment: Needs assistance Sitting-balance support: No upper extremity supported;Feet supported Sitting balance-Leahy Scale: Fair Sitting balance - Comments: Fair static sitting balance at EOB   Standing balance support: Bilateral upper extremity supported;During functional activity Standing balance-Leahy Scale: Poor Standing balance comment: Pt requires MOD A to maintain static sitting balance with b/l UE supported                           ADL either performed or assessed with clinical judgement   ADL Overall ADL's : Needs assistance/impaired  General ADL Comments: SUPERVISION/SET-UP for seated UB ADLs. MOD-MAX A for sit>stand LB ADLs      Pertinent Vitals/Pain Pain Assessment: No/denies pain     Hand Dominance Right    Extremity/Trunk Assessment Upper Extremity Assessment Upper Extremity Assessment: Generalized weakness   Lower Extremity Assessment Lower Extremity Assessment: Generalized weakness          Cognition Arousal/Alertness: Awake/alert Behavior During Therapy: WFL for tasks assessed/performed Overall Cognitive Status: No family/caregiver present to determine baseline cognitive functioning                                 General Comments: Pt A&Ox4. Pt is questionable historian, as some aspects of PLOF differ from reports during previous encounters   General Comments  Attempted to obtain orthostatics (see flowsheet) however HR increased to 174 when standing. Pt returned to supine and HR returning to resting HR of 102.            Home Living Family/patient expects to be discharged to:: Private residence Living Arrangements: Alone Available Help at Discharge: Neighbor;Available PRN/intermittently Type of Home: House Home Access: Stairs to enter Entrance Stairs-Number of Steps: 1/2 step to enter   Home Layout: Two level;Able to live on main level with bedroom/bathroom     Bathroom Shower/Tub: Walk-in shower   Bathroom Toilet: Handicapped height     Home Equipment: Environmental consultant - 2 wheels;Cane - single point;Shower seat - built in   Additional Comments: Pt does not use supplemental O2 at baseline      Prior Functioning/Environment Level of Independence: Independent        Comments: Pt reports that he is independent with ADLs (including sponge-bathing) and functional mobility (without AD) at baseline. Pt reports that he drives and is able to prepare simple meals (i.e., using microwave to eat frozen meals). Reports 3 falls within past 6 months, however previous admissions indicate more than 3        OT Problem List: Decreased strength;Decreased activity tolerance;Impaired balance (sitting and/or standing);Decreased safety awareness;Decreased knowledge of  precautions      OT Treatment/Interventions: Self-care/ADL training;Therapeutic exercise;Energy conservation;DME and/or AE instruction;Therapeutic activities;Patient/family education;Balance training    OT Goals(Current goals can be found in the care plan section) Acute Rehab OT Goals Patient Stated Goal: to return home OT Goal Formulation: With patient Time For Goal Achievement: 02/03/21 Potential to Achieve Goals: Fair ADL Goals Pt Will Perform Grooming: with min assist;standing Pt Will Transfer to Toilet: with min guard assist;stand pivot transfer;bedside commode Pt Will Perform Toileting - Clothing Manipulation and hygiene: with min guard assist;sitting/lateral leans  OT Frequency: Min 1X/week   Barriers to D/C: Decreased caregiver support             AM-PAC OT "6 Clicks" Daily Activity     Outcome Measure Help from another person eating meals?: None Help from another person taking care of personal grooming?: A Little Help from another person toileting, which includes using toliet, bedpan, or urinal?: A Lot Help from another person bathing (including washing, rinsing, drying)?: A Lot Help from another person to put on and taking off regular upper body clothing?: A Little Help from another person to put on and taking off regular lower body clothing?: A Lot 6 Click Score: 16   End of Session Equipment Utilized During Treatment: Gait belt;Rolling walker Nurse Communication: Mobility status;Other (comment) (vitals during mobility)  Activity Tolerance: Treatment limited secondary to medical complications (Comment) Patient  left: in bed;with call bell/phone within reach;with bed alarm set;with nursing/sitter in room  OT Visit Diagnosis: Unsteadiness on feet (R26.81);Muscle weakness (generalized) (M62.81);Repeated falls (R29.6)                Time: 8592-7639 OT Time Calculation (min): 37 min Charges:  OT General Charges $OT Visit: 1 Visit OT Evaluation $OT Eval Moderate  Complexity: 1 Mod OT Treatments $Self Care/Home Management : 23-37 mins  Fredirick Maudlin, OTR/L [place]

## 2021-01-21 DIAGNOSIS — S0003XA Contusion of scalp, initial encounter: Secondary | ICD-10-CM | POA: Diagnosis not present

## 2021-01-21 DIAGNOSIS — S065XAA Traumatic subdural hemorrhage with loss of consciousness status unknown, initial encounter: Secondary | ICD-10-CM | POA: Diagnosis not present

## 2021-01-21 DIAGNOSIS — I5032 Chronic diastolic (congestive) heart failure: Secondary | ICD-10-CM | POA: Diagnosis not present

## 2021-01-21 LAB — PREPARE PLATELET PHERESIS
Unit division: 0
Unit division: 0

## 2021-01-21 LAB — CBC
HCT: 21.9 % — ABNORMAL LOW (ref 39.0–52.0)
Hemoglobin: 7.9 g/dL — ABNORMAL LOW (ref 13.0–17.0)
MCH: 39.9 pg — ABNORMAL HIGH (ref 26.0–34.0)
MCHC: 36.1 g/dL — ABNORMAL HIGH (ref 30.0–36.0)
MCV: 110.6 fL — ABNORMAL HIGH (ref 80.0–100.0)
Platelets: 65 10*3/uL — ABNORMAL LOW (ref 150–400)
RBC: 1.98 MIL/uL — ABNORMAL LOW (ref 4.22–5.81)
RDW: 18.6 % — ABNORMAL HIGH (ref 11.5–15.5)
WBC: 5.8 10*3/uL (ref 4.0–10.5)
nRBC: 0 % (ref 0.0–0.2)

## 2021-01-21 LAB — BASIC METABOLIC PANEL
Anion gap: 5 (ref 5–15)
BUN: 6 mg/dL — ABNORMAL LOW (ref 8–23)
CO2: 26 mmol/L (ref 22–32)
Calcium: 7.9 mg/dL — ABNORMAL LOW (ref 8.9–10.3)
Chloride: 95 mmol/L — ABNORMAL LOW (ref 98–111)
Creatinine, Ser: 0.57 mg/dL — ABNORMAL LOW (ref 0.61–1.24)
GFR, Estimated: >60 mL/min (ref >60)
Glucose, Bld: 89 mg/dL (ref 70–99)
Potassium: 4 mmol/L (ref 3.5–5.1)
Sodium: 126 mmol/L — ABNORMAL LOW (ref 135–145)

## 2021-01-21 LAB — VITAMIN B12: Vitamin B-12: 398 pg/mL (ref 180–914)

## 2021-01-21 LAB — BPAM PLATELET PHERESIS
Blood Product Expiration Date: 202210202359
Blood Product Expiration Date: 202210202359
ISSUE DATE / TIME: 202210180816
ISSUE DATE / TIME: 202210181050
Unit Type and Rh: 5100
Unit Type and Rh: 7300

## 2021-01-21 LAB — IRON AND TIBC
Iron: 82 ug/dL (ref 45–182)
Saturation Ratios: 55 % — ABNORMAL HIGH (ref 17.9–39.5)
TIBC: 150 ug/dL — ABNORMAL LOW (ref 250–450)
UIBC: 68 ug/dL

## 2021-01-21 LAB — MAGNESIUM: Magnesium: 1.6 mg/dL — ABNORMAL LOW (ref 1.7–2.4)

## 2021-01-21 MED ORDER — IPRATROPIUM-ALBUTEROL 0.5-2.5 (3) MG/3ML IN SOLN: 3.0000 mL | Freq: Four times a day (QID) | RESPIRATORY_TRACT | Status: DC | PRN

## 2021-01-21 MED ORDER — FUROSEMIDE 40 MG PO TABS
40.0000 mg | ORAL_TABLET | Freq: Every day | ORAL | Status: DC
  Administered 2021-01-21 – 2021-02-01 (×9): 40 mg via ORAL
  Filled 2021-01-21 (×4): qty 1
  Filled 2021-01-21: qty 2
  Filled 2021-01-21 (×5): qty 1

## 2021-01-21 MED ORDER — NICOTINE 21 MG/24HR TD PT24
21.0000 mg | MEDICATED_PATCH | Freq: Every day | TRANSDERMAL | Status: DC
  Administered 2021-01-21 – 2021-02-02 (×13): 21 mg via TRANSDERMAL
  Filled 2021-01-21 (×13): qty 1

## 2021-01-21 MED ORDER — SENNOSIDES-DOCUSATE SODIUM 8.6-50 MG PO TABS
2.0000 | ORAL_TABLET | Freq: Two times a day (BID) | ORAL | Status: DC
  Administered 2021-01-23 – 2021-02-02 (×13): 2 via ORAL
  Filled 2021-01-21 (×18): qty 2

## 2021-01-21 MED ORDER — CYANOCOBALAMIN 1000 MCG/ML IJ SOLN
1000.0000 ug | Freq: Once | INTRAMUSCULAR | Status: AC
  Administered 2021-01-21: 1000 ug via INTRAMUSCULAR
  Filled 2021-01-21: qty 1

## 2021-01-21 MED ORDER — SODIUM CHLORIDE 1 G PO TABS: 1.0000 g | ORAL_TABLET | Freq: Two times a day (BID) | ORAL | Status: DC

## 2021-01-21 MED ORDER — MAGNESIUM SULFATE 4 GM/100ML IV SOLN
4.0000 g | Freq: Once | INTRAVENOUS | Status: AC
  Administered 2021-01-21: 14:00:00 4 g via INTRAVENOUS
  Filled 2021-01-21: qty 100

## 2021-01-21 MED ORDER — SODIUM CHLORIDE 1 G PO TABS
1.0000 g | ORAL_TABLET | Freq: Every day | ORAL | Status: DC
  Administered 2021-01-23 – 2021-02-02 (×10): 1 g via ORAL
  Filled 2021-01-21 (×12): qty 1

## 2021-01-21 MED ORDER — HYDROCODONE-ACETAMINOPHEN 5-325 MG PO TABS
1.0000 | ORAL_TABLET | ORAL | Status: DC | PRN
  Administered 2021-01-21 – 2021-01-27 (×4): 2 via ORAL
  Filled 2021-01-21 (×5): qty 2

## 2021-01-21 MED ORDER — BUDESONIDE 0.5 MG/2ML IN SUSP: 0.5000 mg | Freq: Two times a day (BID) | RESPIRATORY_TRACT | Status: DC | PRN

## 2021-01-21 MED ORDER — LORAZEPAM 2 MG/ML IJ SOLN
2.0000 mg | Freq: Once | INTRAMUSCULAR | Status: AC
  Administered 2021-01-21: 2 mg via INTRAVENOUS
  Filled 2021-01-21: qty 1

## 2021-01-21 NOTE — Progress Notes (Signed)
   01/20/21 1639  Assess: MEWS Score  Temp 98 F (36.7 C)  BP 123/80  Pulse Rate (!) 120  Resp 18  SpO2 99 %  Assess: MEWS Score  MEWS Temp 0  MEWS Systolic 0  MEWS Pulse 2  MEWS RR 0  MEWS LOC 0  MEWS Score 2  MEWS Score Color Yellow  Assess: if the MEWS score is Yellow or Red  Were vital signs taken at a resting state? Yes  Focused Assessment Change from prior assessment (see assessment flowsheet)  Does the patient meet 2 or more of the SIRS criteria? No  MEWS guidelines implemented *See Row Information* Yes  Treat  MEWS Interventions Escalated (See documentation below)  Take Vital Signs  Increase Vital Sign Frequency  Yellow: Q 2hr X 2 then Q 4hr X 2, if remains yellow, continue Q 4hrs  Escalate  MEWS: Escalate Yellow: discuss with charge nurse/RN and consider discussing with provider and RRT  Notify: Charge Nurse/RN  Name of Charge Nurse/RN Notified JO RN  Date Charge Nurse/RN Notified 01/20/21  Time Charge Nurse/RN Notified 1640  Notify: Provider  Provider Name/Title Dr Leslye Peer  Date Provider Notified 01/20/21  Time Provider Notified 1641  Notification Type  (secure chat)  Notification Reason Change in status  Provider response See new orders  Date of Provider Response 01/20/21  Time of Provider Response 1642  Document  Patient Outcome Other (Comment) (monitor)  Assess: SIRS CRITERIA  SIRS Temperature  0  SIRS Pulse 1  SIRS Respirations  0  SIRS WBC 0  SIRS Score Sum  1

## 2021-01-21 NOTE — Progress Notes (Signed)
Physical Therapy Treatment Patient Details Name: Fernando [name] MRN: [id number] DOB: 1955-12-22 Today's Date: 01/21/2021   History of Present Illness 65 y.o. male who presents by EMS for evaluation after a fall at home.  The patient says he remembers falling but does not remember exactly the circumstances. Pt had another fall in the emergency department bathroom and sustained another injury to his forehead. Pt found to have acute subdural hematoma. While admitted, pt with multiple bouts of black tarry diarrhea. Past medical history significant for diastolic CHF, alcohol use, alcoholic liver cirrhosis, hypertension, recent hospitalization from 9/15-9/21 with upper GI bleed.    PT Comments    Pt received supine in bed, agreeable to therapy. He appears motivated to participate. Pt sat up to EOB with SUP, VC to decrease speed of movement for improved safety awareness. Pt continues to demo decreased steadiness in standing, RW and CGA required. Pt remains tachycardic however much improved since yesterday. Ambulation was evaluated today with max HR of 133. Pt declined further activity due to fatigue. PT continuing to rec SNF due to lack of caregiver support and decreased steadiness with standing and walking. Would benefit from skilled PT to address above deficits and promote optimal return to PLOF.   Recommendations for follow up therapy are one component of a multi-disciplinary discharge planning process, led by the attending physician.  Recommendations may be updated based on patient status, additional functional criteria and insurance authorization.  Follow Up Recommendations  Supervision/Assistance - 24 hour;SNF     Equipment Recommendations  Other (comment) (TBD at next venue of care)    Recommendations for Other Services       Precautions / Restrictions Precautions Precautions: None Restrictions Weight Bearing Restrictions: No Other Position/Activity Restrictions: Monitor HR      Mobility  Bed Mobility Overal bed mobility: Needs Assistance Bed Mobility: Supine to Sit     Supine to sit: Supervision;HOB elevated     General bed mobility comments: Performed quickly - pt initating movement prior to bed rail being lowered. SUP for safety.    Transfers Overall transfer level: Needs assistance Equipment used: Rolling walker (2 wheeled) Transfers: Sit to/from Stand Sit to Stand: Min guard         General transfer comment: CGA to steady  Ambulation/Gait Ambulation/Gait assistance: Min guard Gait Distance (Feet): 35 Feet Assistive device: Rolling walker (2 wheeled) Gait Pattern/deviations: Step-through pattern;Wide base of support;Trunk flexed     General Gait Details: No significant gait deviations however increased tremors with mobility requiring CGA to steady.   Stairs             Wheelchair Mobility    Modified Rankin (Stroke Patients Only)       Balance Overall balance assessment: Needs assistance Sitting-balance support: No upper extremity supported;Feet supported Sitting balance-Leahy Scale: Good Sitting balance - Comments: Sat EOB to allow HR to decrease as PT moved items in room to create a walkway.   Standing balance support: Bilateral upper extremity supported;During functional activity Standing balance-Leahy Scale: Poor Standing balance comment: Pt requires CGA and BUE support on RW during STS and ambulation                            Cognition Arousal/Alertness: Awake/alert Behavior During Therapy: WFL for tasks assessed/performed Overall Cognitive Status: Within Functional Limits for tasks assessed  Exercises      General Comments        Pertinent Vitals/Pain Pain Assessment: 0-10 Pain Score: 7  Pain Descriptors / Indicators: Headache Pain Intervention(s): Limited activity within patient's tolerance;Monitored during session    Home Living                       Prior Function            PT Goals (current goals can now be found in the care plan section) Acute Rehab PT Goals Patient Stated Goal: to return home    Frequency    Min 2X/week      PT Plan      Co-evaluation              AM-PAC PT "6 Clicks" Mobility   Outcome Measure  Help needed turning from your back to your side while in a flat bed without using bedrails?: None Help needed moving from lying on your back to sitting on the side of a flat bed without using bedrails?: None Help needed moving to and from a bed to a chair (including a wheelchair)?: A Little Help needed standing up from a chair using your arms (e.g., wheelchair or bedside chair)?: A Little Help needed to walk in hospital room?: A Little Help needed climbing 3-5 steps with a railing? : A Little 6 Click Score: 20    End of Session Equipment Utilized During Treatment: Gait belt;Oxygen Activity Tolerance: Patient tolerated treatment well;Patient limited by fatigue Patient left: with call bell/phone within reach;in chair;with chair alarm set Nurse Communication: Mobility status;Precautions PT Visit Diagnosis: Unsteadiness on feet (R26.81);Other abnormalities of gait and mobility (R26.89);Difficulty in walking, not elsewhere classified (R26.2);Repeated falls (R29.6);Muscle weakness (generalized) (M62.81)     Time: 1173-5670 PT Time Calculation (min) (ACUTE ONLY): 23 min  Charges:  $Gait Training: 8-22 mins $Therapeutic Activity: 8-22 mins                      Patrina Levering PT, DPT 01/21/21 12:36 PM [phone]

## 2021-01-21 NOTE — Progress Notes (Addendum)
PROGRESS NOTE    Fernando [name]  ZOX:[id number] DOB: 02-24-1956 DOA: 01/19/2021 PCP: [name], MD    Brief Narrative:  65 year old man with chronic systolic congestive heart failure, essential hypertension, hyperlipidemia, and alcohol abuse.  He presented after a fall and was able to crawl to his phone to call 911.  He was found to have a large laceration on his forehead which was sutured in the emergency room and a subdural hematoma.  Sutures will have to be removed in about 6 days.  Later developed multiple black bowel movements.  Had  platelet transfusion on 01/20/2021.  Patient also has severe hypomagnesemia and hypokalemia on presentation and electrolytes were replaced.   Assessment & Plan:   Principal Problem:   Acute subdural hematoma Active Problems:   Essential hypertension   Alcohol use disorder, moderate, dependence (HCC)   Alcoholic liver disease (HCC)   Chronic diastolic CHF (congestive heart failure) (HCC)   Hypokalemia   Hypomagnesemia   Laceration of forehead   Hematoma of frontal scalp   History of GI bleed   Subdural hematoma  Head concussion with a forehead laceration. Subdural hematoma. Ground-level fall. Condition has been stable, sutures removed.  Anemia. Thrombocytopenia. Melena secondary to erosive esophagitis. Patient has been seen by GI, he had a recent EGD about a month ago, showed erosive esophagitis. Currently patient treated with PPI twice a day. Hemoglobin is lower today, B12 level borderline, check homocystine level, start B12 supplement. Iron level normal. Thrombocytopenia probably secondary to alcohol.  Platelet level better after giving transfusion.  Alcohol use disorder. Alcoholic hepatitis  Continue CIWA protocol, no evidence of withdrawal at this time.  Chronic diastolic congestive heart failure  Stable, no exacerbation. Restart furosemide 40 mg daily. Reviewed echocardiogram, ejection fraction 55 to 60%.   COPD   Stable.  Hypokalemia. Hypomagnesemia Hyponatremia secondary to SIADH. Recheck levels tomorrow. Sodium level is lower today, start salt tablets.  Fluid restriction. Replete magnesium again   DVT prophylaxis:  Code Status:  Family Communication:  Disposition Plan:    Status is: Inpatient  Remains inpatient appropriate because: Significant electrolytes abnormality        I/O last 3 completed shifts: In: 845 [Blood:845] Out: 4900 [Urine:4900] No intake/output data recorded.     Consultants:  None  Procedures: None  Antimicrobials: None  Subjective: Patient doing better, still on 2 L oxygen without short of breath.  Discussed with the nurse, will wean off oxygen. Denies any abdominal pain or nausea vomiting. No dysuria hematuria No chest pain or palpitation. No fever or chills.  Objective: Vitals:   01/20/21 2032 01/20/21 2357 01/21/21 0648 01/21/21 0716  BP: 105/74 112/82 90/62 113/78  Pulse: (!) 111 90 84 92  Resp:  17 16 18   Temp: 98.1 F (36.7 C)  98 F (36.7 C) 97.8 F (36.6 C)  TempSrc: Oral  Oral Oral  SpO2: 98% 98% 98% 100%  Weight:      Height:        Intake/Output Summary (Last 24 hours) at 01/21/2021 1302 Last data filed at 01/21/2021 0452 Gross per 24 hour  Intake 300 ml  Output 2450 ml  Net -2150 ml   Filed Weights   01/19/21 0037  Weight: 68 kg    Examination:  General exam: Appears calm and comfortable  Respiratory system: Clear to auscultation. Respiratory effort normal. Cardiovascular system: S1 & S2 heard, RRR. No JVD, murmurs, rubs, gallops or clicks. No pedal edema. Gastrointestinal system: Abdomen is nondistended, soft and nontender.  No organomegaly or masses felt. Normal bowel sounds heard. Central nervous system: Alert and oriented. No focal neurological deficits. Extremities: Symmetric  Skin: No rashes, lesions or ulcers Psychiatry:  Mood & affect appropriate.     Data Reviewed: I have personally reviewed  following labs and imaging studies  CBC: Recent Labs  Lab 01/19/21 0039 01/19/21 0236 01/19/21 1402 01/20/21 0453 01/20/21 0950 01/21/21 0504  WBC 5.8 6.9  --  4.0 4.4 5.8  NEUTROABS 3.4 4.0  --   --   --   --   HGB 10.5* 10.7* 9.1* 8.1* 8.2* 7.9*  HCT 28.1* 28.7*  --  23.4* 22.9* 21.9*  MCV 105.6* 105.5*  --  109.9* 111.2* 110.6*  PLT 51* 53*  --  29* 49* 65*   Basic Metabolic Panel: Recent Labs  Lab 01/19/21 0039 01/19/21 0236 01/19/21 1402 01/20/21 0453 01/21/21 0504  NA 125* 126*  --  133* 126*  K 2.7* 2.7* 3.4* 3.4* 4.0  CL 87* 87*  --  100 95*  CO2 22 21*  --  29 26  GLUCOSE 100* 101*  --  99 89  BUN 10 10  --  8 6*  CREATININE 0.69 0.80  --  0.53* 0.57*  CALCIUM 7.5* 7.6*  --  7.2* 7.9*  MG 1.1*  --  1.8 1.5* 1.6*  PHOS  --  2.5  --   --   --    GFR: Estimated Creatinine Clearance: 77.9 mL/min (A) (by C-G formula based on SCr of 0.57 mg/dL (L)). Liver Function Tests: Recent Labs  Lab 01/19/21 0039  AST 95*  ALT 39  ALKPHOS 129*  BILITOT 1.7*  PROT 6.3*  ALBUMIN 3.1*   No results for input(s): LIPASE, AMYLASE in the last 168 hours. No results for input(s): AMMONIA in the last 168 hours. Coagulation Profile: Recent Labs  Lab 01/20/21 0453  INR 1.1   Cardiac Enzymes: No results for input(s): CKTOTAL, CKMB, CKMBINDEX, TROPONINI in the last 168 hours. BNP (last 3 results) No results for input(s): PROBNP in the last 8760 hours. HbA1C: No results for input(s): HGBA1C in the last 72 hours. CBG: No results for input(s): GLUCAP in the last 168 hours. Lipid Profile: No results for input(s): CHOL, HDL, LDLCALC, TRIG, CHOLHDL, LDLDIRECT in the last 72 hours. Thyroid Function Tests: No results for input(s): TSH, T4TOTAL, FREET4, T3FREE, THYROIDAB in the last 72 hours. Anemia Panel: Recent Labs    01/21/21 0504 01/21/21 0827  VITAMINB12  --  398  TIBC 150*  --   IRON 82  --    Sepsis Labs: No results for input(s): PROCALCITON, LATICACIDVEN in  the last 168 hours.  Recent Results (from the past 240 hour(s))  Resp Panel by RT-PCR (Flu A&B, Covid) Nasopharyngeal Swab     Status: None   Collection Time: 01/19/21  4:39 AM   Specimen: Nasopharyngeal Swab; Nasopharyngeal(NP) swabs in vial transport medium  Result Value Ref Range Status   SARS Coronavirus 2 by RT PCR NEGATIVE NEGATIVE Final    Comment: (NOTE) SARS-CoV-2 target nucleic acids are NOT DETECTED.  The SARS-CoV-2 RNA is generally detectable in upper respiratory specimens during the acute phase of infection. The lowest concentration of SARS-CoV-2 viral copies this assay can detect is 138 copies/mL. A negative result does not preclude SARS-Cov-2 infection and should not be used as the sole basis for treatment or other patient management decisions. A negative result may occur with  improper specimen collection/handling, submission of specimen other than nasopharyngeal  swab, presence of viral mutation(s) within the areas targeted by this assay, and inadequate number of viral copies(<138 copies/mL). A negative result must be combined with clinical observations, patient history, and epidemiological information. The expected result is Negative.  Fact Sheet for Patients:  EntrepreneurPulse.com.au  Fact Sheet for Healthcare Providers:  IncredibleEmployment.be  This test is no t yet approved or cleared by the [place] FDA and  has been authorized for detection and/or diagnosis of SARS-CoV-2 by FDA under an Emergency Use Authorization (EUA). This EUA will remain  in effect (meaning this test can be used) for the duration of the COVID-19 declaration under Section 564(b)(1) of the Act, 21 U.S.C.section 360bbb-3(b)(1), unless the authorization is terminated  or revoked sooner.       Influenza A by PCR NEGATIVE NEGATIVE Final   Influenza B by PCR NEGATIVE NEGATIVE Final    Comment: (NOTE) The Xpert Xpress SARS-CoV-2/FLU/RSV plus assay  is intended as an aid in the diagnosis of influenza from Nasopharyngeal swab specimens and should not be used as a sole basis for treatment. Nasal washings and aspirates are unacceptable for Xpert Xpress SARS-CoV-2/FLU/RSV testing.  Fact Sheet for Patients: EntrepreneurPulse.com.au  Fact Sheet for Healthcare Providers: IncredibleEmployment.be  This test is not yet approved or cleared by the [place] FDA and has been authorized for detection and/or diagnosis of SARS-CoV-2 by FDA under an Emergency Use Authorization (EUA). This EUA will remain in effect (meaning this test can be used) for the duration of the COVID-19 declaration under Section 564(b)(1) of the Act, 21 U.S.C. section 360bbb-3(b)(1), unless the authorization is terminated or revoked.  Performed at [facility], [address], [place], [place] [address]          Radiology Studies: No results found.      Scheduled Meds:  acetaminophen  650 mg Oral Once   allopurinol  100 mg Oral Daily   budesonide (PULMICORT) nebulizer solution  0.5 mg Nebulization BID   carvedilol  6.25 mg Oral BID WC   cephALEXin  500 mg Oral Q8H   cyanocobalamin  1,000 mcg Intramuscular Once   folic acid  1 mg Oral Daily   ipratropium-albuterol  3 mL Nebulization BID   LORazepam  0-4 mg Intravenous Q12H   Or   LORazepam  0-4 mg Oral Q12H   multivitamin with minerals  1 tablet Oral Daily   nicotine  21 mg Transdermal Daily   pantoprazole  40 mg Intravenous Q12H   sodium chloride  1 g Oral BID WC   thiamine  100 mg Oral Daily   Or   thiamine  100 mg Intravenous Daily   Continuous Infusions:  magnesium sulfate bolus IVPB       LOS: 1 day    Time spent: 32 minutes    Sharen Hones, MD [facility]   To contact the attending provider between 7A-7P or the covering provider during after hours 7P-7A, please log into the web site [email or web address] and access using universal  [place] password for that web site. If you do not have the password, please call the hospital operator.  01/21/2021, 1:02 PM

## 2021-01-21 NOTE — Progress Notes (Signed)
[facility]ology Inpatient Follow-up/Progress Note   Patient ID: Fernando [name] is a 65 y.o. male.  Overnight Events / Subjective Findings Patient is resting in chair comfortably.  He seems less tremulous today.  He has been tolerating p.o.  Notes that his bowel frequency has decreased.  Hemoglobin today is 7.9  fairly stable as it was 8.1 yesterday and platelets have improved with transfusion. BUN/creatinine ratio remains normal.  He denies nausea vomiting and abdominal pain.  No hematochezia or hematemesis or coffee-ground emesis  No other acute gi complaints.  Review of Systems  Constitutional:  Negative for activity change, appetite change, chills, fatigue, fever and unexpected weight change.  HENT:  Negative for trouble swallowing and voice change.   Respiratory:  Negative for shortness of breath.   Cardiovascular:  Negative for chest pain and palpitations.  Gastrointestinal:  Negative for abdominal distention, abdominal pain, anal bleeding, blood in stool, constipation, diarrhea, nausea and vomiting.  Musculoskeletal:  Negative for arthralgias and myalgias.  Skin:  Negative for color change and pallor.  Neurological:  Negative for dizziness, syncope and weakness.  All other systems reviewed and are negative.   Medications  Current Facility-Administered Medications:    acetaminophen (TYLENOL) tablet 650 mg, 650 mg, Oral, Q6H PRN **OR** acetaminophen (TYLENOL) suppository 650 mg, 650 mg, Rectal, Q6H PRN, Athena Masse, MD   acetaminophen (TYLENOL) tablet 650 mg, 650 mg, Oral, Once, Wieting, Richard, MD   allopurinol (ZYLOPRIM) tablet 100 mg, 100 mg, Oral, Daily, Leslye Peer, Richard, MD, 100 mg at 01/21/21 0857   budesonide (PULMICORT) nebulizer solution 0.5 mg, 0.5 mg, Nebulization, BID, Leslye Peer, Richard, MD, 0.5 mg at 01/21/21 0716   carvedilol (COREG) tablet 6.25 mg, 6.25 mg, Oral, BID WC, Wieting, Richard, MD, 6.25 mg at 01/21/21 0857   cephALEXin (KEFLEX) capsule 500 mg,  500 mg, Oral, Q8H, Wieting, Richard, MD, 500 mg at 01/21/21 1432   cyanocobalamin ((VITAMIN B-12)) injection 1,000 mcg, 1,000 mcg, Intramuscular, Once, Sharen Hones, MD   folic acid (FOLVITE) tablet 1 mg, 1 mg, Oral, Daily, Judd Gaudier V, MD, 1 mg at 01/21/21 0857   furosemide (LASIX) tablet 40 mg, 40 mg, Oral, Daily, Sharen Hones, MD   HYDROcodone-acetaminophen (NORCO/VICODIN) 5-325 MG per tablet 1-2 tablet, 1-2 tablet, Oral, Q4H PRN, Dorothe Pea, RPH   ipratropium-albuterol (DUONEB) 0.5-2.5 (3) MG/3ML nebulizer solution 3 mL, 3 mL, Nebulization, BID, Wieting, Richard, MD, 3 mL at 01/21/21 0716   LORazepam (ATIVAN) injection 0-4 mg, 0-4 mg, Intravenous, Q12H **OR** LORazepam (ATIVAN) tablet 0-4 mg, 0-4 mg, Oral, Q12H, Hinda Kehr, MD   magnesium sulfate IVPB 4 g 100 mL, 4 g, Intravenous, Once, Sharen Hones, MD, Last Rate: 50 mL/hr at 01/21/21 1345, 4 g at 01/21/21 1345   multivitamin with minerals tablet 1 tablet, 1 tablet, Oral, Daily, Judd Gaudier V, MD, 1 tablet at 01/21/21 0857   nicotine (NICODERM CQ - dosed in mg/24 hours) patch 21 mg, 21 mg, Transdermal, Daily, Judd Gaudier V, MD, 21 mg at 01/21/21 0856   ondansetron (ZOFRAN) tablet 4 mg, 4 mg, Oral, Q6H PRN **OR** ondansetron (ZOFRAN) injection 4 mg, 4 mg, Intravenous, Q6H PRN, Athena Masse, MD   pantoprazole (PROTONIX) injection 40 mg, 40 mg, Intravenous, Q12H, Wieting, Richard, MD, 40 mg at 01/21/21 1432   [START ON 01/22/2021] sodium chloride tablet 1 g, 1 g, Oral, Q0600, Sharen Hones, MD   thiamine tablet 100 mg, 100 mg, Oral, Daily, 100 mg at 01/21/21 0857 **OR** thiamine (B-1) injection 100 mg, 100  mg, Intravenous, Daily, Hinda Kehr, MD, 100 mg at 01/19/21 0434   zolpidem (AMBIEN) tablet 5 mg, 5 mg, Oral, QHS PRN, Tamala Julian, Rondell A, MD  magnesium sulfate bolus IVPB 4 g (01/21/21 1345)    acetaminophen **OR** acetaminophen, HYDROcodone-acetaminophen, ondansetron **OR** ondansetron (ZOFRAN) IV, zolpidem   Objective     Vitals:   01/20/21 2032 01/20/21 2357 01/21/21 0648 01/21/21 0716  BP: 105/74 112/82 90/62 113/78  Pulse: (!) 111 90 84 92  Resp:  17 16 18   Temp: 98.1 F (36.7 C)  98 F (36.7 C) 97.8 F (36.6 C)  TempSrc: Oral  Oral Oral  SpO2: 98% 98% 98% 100%  Weight:      Height:         Physical Exam Vitals and nursing note reviewed.  Constitutional:      General: He is not in acute distress.    Appearance: Normal appearance. He is not toxic-appearing or diaphoretic.  HENT:     Head: Normocephalic.     Nose: Nose normal.     Mouth/Throat:     Mouth: Mucous membranes are moist.     Pharynx: Oropharynx is clear.  Eyes:     General: No scleral icterus.    Extraocular Movements: Extraocular movements intact.  Cardiovascular:     Rate and Rhythm: Normal rate and regular rhythm.     Heart sounds: Normal heart sounds. No murmur heard.   No friction rub. No gallop.  Pulmonary:     Effort: Pulmonary effort is normal. No respiratory distress.     Breath sounds: Normal breath sounds. No wheezing, rhonchi or rales.  Abdominal:     General: Abdomen is flat. Bowel sounds are normal. There is no distension.     Palpations: Abdomen is soft.     Tenderness: There is no abdominal tenderness. There is no guarding or rebound.  Musculoskeletal:     Cervical back: Neck supple.     Right lower leg: No edema.     Left lower leg: No edema.  Skin:    General: Skin is warm and dry.     Coloration: Skin is not jaundiced or pale.  Neurological:     Mental Status: He is alert and oriented to person, place, and time.  Psychiatric:        Thought Content: Thought content normal.        Judgment: Judgment normal.     Laboratory Data Recent Labs  Lab 01/19/21 0039 01/19/21 0236 01/19/21 1402 01/20/21 0453 01/20/21 0950 01/21/21 0504  WBC 5.8 6.9  --  4.0 4.4 5.8  HGB 10.5* 10.7*   < > 8.1* 8.2* 7.9*  HCT 28.1* 28.7*  --  23.4* 22.9* 21.9*  PLT 51* 53*  --  29* 49* 65*  NEUTOPHILPCT 57  58  --   --   --   --   LYMPHOPCT 29 30  --   --   --   --   MONOPCT 12 11  --   --   --   --   EOSPCT 0 0  --   --   --   --    < > = values in this interval not displayed.    Recent Labs  Lab 01/19/21 0039 01/19/21 0236 01/19/21 1402 01/20/21 0453 01/21/21 0504  NA 125* 126*  --  133* 126*  K 2.7* 2.7* 3.4* 3.4* 4.0  CL 87* 87*  --  100 95*  CO2 22 21*  --  29 26  BUN 10 10  --  8 6*  CREATININE 0.69 0.80  --  0.53* 0.57*  CALCIUM 7.5* 7.6*  --  7.2* 7.9*  PROT 6.3*  --   --   --   --   BILITOT 1.7*  --   --   --   --   ALKPHOS 129*  --   --   --   --   ALT 39  --   --   --   --   AST 95*  --   --   --   --   GLUCOSE 100* 101*  --  99 89    Recent Labs  Lab 01/20/21 0453  INR 1.1       Imaging Studies: No results found.  Assessment:    # Melena - egd last month with gastric erosions, esophagitis and gastropathy - given his thrombocytopenia, these areas were likely oozing leading to dark stools. Expect this to clear up in the next few days - hemodynamically stable - similar findings on EGD in May of this year and October of last year - BUN/Cr ratio 12.5 - h pylori negaive on previous egd   # anemia- macrocytic- pancytopenia - hgb comparable to where it was over the last year; likely level of dehydration effect upon arrival. Base appears to be around 9 on his last hospitalization - 2/2 to etoh bone marrow suppression; - decrease during hospitalization likely 2/2 head laceration and fluid resuscitation with hypotension - there may be small amount of GI oozing from previously reported sites in setting of thrombocytopenia, but seems to be improving as the patient has received platelet transfusions   # subdural hematoma- 20mm on CT - 2/2 syncopal episode with head strike- with laceration   # thrombocytopenia- improving with transfusion - 2/2 etoh use; no imaging findings of cirrhosis on multiple scans- CT and [place]   # EtOH abuse with withdrawal - level >300 on  arrival # Alcoholic hepatitis - AST 95; ALT 39 # multiple electrolyte abnormalities             - hypo mag, hypokalemia hypo natremia # h/o esophagitis and gastric erosions on multiple egds # h/o pancreatic cysts # tobacco dependence # HFpEF    Plan:  - Recent EGD reviewed- given recent findings and significant thrombocytopenia, suspect he is potentially oozing from sites. These would not be treated endoscopically, but with ppi.  - expect dark stools to continue for a few more days as this moves through his gi tract  - can transition to protonix 40 mg po bid for out patient diet - Follow up with his primary gastroenterologist (Dr. Scarlette Shorts) as an outpatient - Discussed plans for alcohol cessation with recommendation for attending meetings. -transfusion and resuscitation as per primary team - CIWA protocol and supportive care as per primary team - electrolyte correction as per primary team - ok for regular diet- counseled on importance of high lean protein diet with alcohol liver disease and alcoholic hepatitis - recommend nsaid avoidance  GI to sign off. Available as needed. Please do not hesitate to call regarding questions or concerns.   I personally performed the service.  Management of other medical comorbidities as per primary team  Thank you for allowing [place] to participate in this patient's care. Please don't hesitate to call if any questions or concerns arise.   [name], DO [facility]  Portions of the record may have been created with  voice recognition software. Occasional wrong-word or 'sound-a-like' substitutions may have occurred due to the inherent limitations of voice recognition software.  Read the chart carefully and recognize, using context, where substitutions may have occurred.

## 2021-01-22 ENCOUNTER — Inpatient Hospital Stay: Payer: Self-pay

## 2021-01-22 DIAGNOSIS — S065XAA Traumatic subdural hemorrhage with loss of consciousness status unknown, initial encounter: Secondary | ICD-10-CM | POA: Diagnosis not present

## 2021-01-22 LAB — CBC WITH DIFFERENTIAL/PLATELET
Abs Immature Granulocytes: 0.01 10*3/uL (ref 0.00–0.07)
Basophils Absolute: 0 10*3/uL (ref 0.0–0.1)
Basophils Relative: 0 %
Eosinophils Absolute: 0 10*3/uL (ref 0.0–0.5)
Eosinophils Relative: 1 %
HCT: 22.1 % — ABNORMAL LOW (ref 39.0–52.0)
Hemoglobin: 8.1 g/dL — ABNORMAL LOW (ref 13.0–17.0)
Immature Granulocytes: 0 %
Lymphocytes Relative: 18 %
Lymphs Abs: 0.5 10*3/uL — ABNORMAL LOW (ref 0.7–4.0)
MCH: 40.3 pg — ABNORMAL HIGH (ref 26.0–34.0)
MCHC: 36.7 g/dL — ABNORMAL HIGH (ref 30.0–36.0)
MCV: 110 fL — ABNORMAL HIGH (ref 80.0–100.0)
Monocytes Absolute: 0.3 10*3/uL (ref 0.1–1.0)
Monocytes Relative: 12 %
Neutro Abs: 1.8 10*3/uL (ref 1.7–7.7)
Neutrophils Relative %: 69 %
Platelets: 69 10*3/uL — ABNORMAL LOW (ref 150–400)
RBC: 2.01 MIL/uL — ABNORMAL LOW (ref 4.22–5.81)
RDW: 17.8 % — ABNORMAL HIGH (ref 11.5–15.5)
Smear Review: NORMAL
WBC: 2.6 10*3/uL — ABNORMAL LOW (ref 4.0–10.5)
nRBC: 0 % (ref 0.0–0.2)

## 2021-01-22 LAB — HOMOCYSTEINE: Homocysteine: 12.1 umol/L (ref 0.0–17.2)

## 2021-01-22 LAB — MAGNESIUM
Magnesium: 1.4 mg/dL — ABNORMAL LOW (ref 1.7–2.4)
Magnesium: 2.2 mg/dL (ref 1.7–2.4)

## 2021-01-22 LAB — BASIC METABOLIC PANEL
Anion gap: 8 (ref 5–15)
BUN: 11 mg/dL (ref 8–23)
CO2: 26 mmol/L (ref 22–32)
Calcium: 8.1 mg/dL — ABNORMAL LOW (ref 8.9–10.3)
Chloride: 90 mmol/L — ABNORMAL LOW (ref 98–111)
Creatinine, Ser: 0.89 mg/dL (ref 0.61–1.24)
GFR, Estimated: >60 mL/min (ref >60)
Glucose, Bld: 100 mg/dL — ABNORMAL HIGH (ref 70–99)
Potassium: 3.7 mmol/L (ref 3.5–5.1)
Sodium: 124 mmol/L — ABNORMAL LOW (ref 135–145)

## 2021-01-22 LAB — MRSA NEXT GEN BY PCR, NASAL: MRSA by PCR Next Gen: NOT DETECTED

## 2021-01-22 LAB — POTASSIUM: Potassium: 4.6 mmol/L (ref 3.5–5.1)

## 2021-01-22 LAB — GLUCOSE, CAPILLARY: Glucose-Capillary: 109 mg/dL — ABNORMAL HIGH (ref 70–99)

## 2021-01-22 LAB — PHOSPHORUS
Phosphorus: 1.3 mg/dL — ABNORMAL LOW (ref 2.5–4.6)
Phosphorus: 5.4 mg/dL — ABNORMAL HIGH (ref 2.5–4.6)

## 2021-01-22 MED ORDER — LORAZEPAM 2 MG PO TABS: 0.0000 mg | ORAL_TABLET | Freq: Four times a day (QID) | ORAL | Status: DC

## 2021-01-22 MED ORDER — THIAMINE HCL 100 MG/ML IJ SOLN
Freq: Once | INTRAVENOUS | Status: AC
  Filled 2021-01-22: qty 1000

## 2021-01-22 MED ORDER — THIAMINE HCL 100 MG/ML IJ SOLN
Freq: Once | INTRAVENOUS | Status: DC
  Filled 2021-01-22: qty 1000

## 2021-01-22 MED ORDER — LORAZEPAM 2 MG/ML IJ SOLN
2.0000 mg | Freq: Once | INTRAMUSCULAR | Status: AC
  Administered 2021-01-22: 04:00:00 2 mg via INTRAVENOUS
  Filled 2021-01-22: qty 1

## 2021-01-22 MED ORDER — LORAZEPAM 2 MG/ML IJ SOLN
1.0000 mg | INTRAMUSCULAR | Status: AC | PRN
  Administered 2021-01-24: 2 mg via INTRAVENOUS
  Filled 2021-01-22: qty 1

## 2021-01-22 MED ORDER — CHLORHEXIDINE GLUCONATE CLOTH 2 % EX PADS
6.0000 | MEDICATED_PAD | Freq: Every day | CUTANEOUS | Status: DC
  Administered 2021-01-22 – 2021-01-23 (×2): 6 via TOPICAL

## 2021-01-22 MED ORDER — LORAZEPAM 1 MG PO TABS: 1.0000 mg | ORAL_TABLET | ORAL | Status: AC | PRN

## 2021-01-22 MED ORDER — DEXMEDETOMIDINE HCL IN NACL 400 MCG/100ML IV SOLN
0.4000 ug/kg/h | INTRAVENOUS | Status: DC
  Administered 2021-01-22: 0.4 ug/kg/h via INTRAVENOUS
  Filled 2021-01-22: qty 100

## 2021-01-22 MED ORDER — LORAZEPAM 2 MG/ML IJ SOLN
2.0000 mg | Freq: Once | INTRAMUSCULAR | Status: AC
  Administered 2021-01-22: 2 mg via INTRAVENOUS
  Filled 2021-01-22: qty 1

## 2021-01-22 MED ORDER — SODIUM CHLORIDE 0.9 % IV SOLN
250.0000 mL | INTRAVENOUS | Status: DC
  Administered 2021-01-24: 250 mL via INTRAVENOUS

## 2021-01-22 MED ORDER — LACTATED RINGERS IV BOLUS
1000.0000 mL | Freq: Once | INTRAVENOUS | Status: AC
  Administered 2021-01-22: 1000 mL via INTRAVENOUS

## 2021-01-22 MED ORDER — M.V.I. ADULT IV INJ
Freq: Once | INTRAVENOUS | Status: DC
  Filled 2021-01-22: qty 10

## 2021-01-22 MED ORDER — LORAZEPAM 2 MG/ML IJ SOLN: 0.0000 mg | Freq: Four times a day (QID) | INTRAMUSCULAR | Status: DC

## 2021-01-22 MED ORDER — DIAZEPAM 5 MG/ML IJ SOLN
5.0000 mg | Freq: Four times a day (QID) | INTRAMUSCULAR | Status: DC
  Administered 2021-01-22 – 2021-01-27 (×20): 5 mg via INTRAVENOUS
  Filled 2021-01-22 (×19): qty 2

## 2021-01-22 MED ORDER — LORAZEPAM 2 MG/ML IJ SOLN
INTRAMUSCULAR | Status: AC
  Administered 2021-01-22: 2 mg via INTRAVENOUS
  Filled 2021-01-22: qty 1

## 2021-01-22 MED ORDER — NOREPINEPHRINE 4 MG/250ML-% IV SOLN
2.0000 ug/min | INTRAVENOUS | Status: DC
Start: 2021-01-22 — End: 2021-01-24
  Administered 2021-01-22: 2 ug/min via INTRAVENOUS
  Administered 2021-01-22: 8 ug/min via INTRAVENOUS
  Filled 2021-01-22 (×2): qty 250

## 2021-01-22 MED ORDER — MAGNESIUM SULFATE 4 GM/100ML IV SOLN
4.0000 g | Freq: Once | INTRAVENOUS | Status: AC
  Administered 2021-01-22: 4 g via INTRAVENOUS
  Filled 2021-01-22: qty 100

## 2021-01-22 MED ORDER — POTASSIUM PHOSPHATES 15 MMOLE/5ML IV SOLN
30.0000 mmol | Freq: Once | INTRAVENOUS | Status: AC
  Administered 2021-01-22: 30 mmol via INTRAVENOUS
  Filled 2021-01-22: qty 10

## 2021-01-22 NOTE — Progress Notes (Signed)
Patient has become very agitated, anxious, restless, and confused with hallucinations. Throughout the night his CIWA score has gone from 7-31 throughout which Ativan was given multiple times based off of CIWA score. (This is day 3 and patient does have hx of ETOH abuse)  This RN along with other staff members have constantly had to run into room due to numerous attempts of patient trying to get out of bed, and almost falling over railing multiple times.   Provider at bedside to assess patient, and determined that he needs higher level of care. Will continue to monitor until patient transported.

## 2021-01-22 NOTE — Progress Notes (Signed)
OT Cancellation Note  Patient Details Name: Fernando [name] MRN: [id number] DOB: 06-01-1955   Cancelled Treatment:    Reason Eval/Treat Not Completed: Medical issues which prohibited therapy Pt transferred to CCU d/t worsening agitation in setting of CIWA protocols. As pt has transferred to higher level of care, will require continue at transfer order or new order to continue OT services (if deemed appropriate). If not, will complete the original order from 10/17 today and await new orders if/when pt more able to participate. Thank you.  Gerrianne Scale, [place], OTR/L ascom [phone] 01/22/21, 9:12 AM

## 2021-01-22 NOTE — Plan of Care (Signed)
  Problem: Education: Goal: Knowledge of General Education information will improve Description: Including pain rating scale, medication(s)/side effects and non-pharmacologic comfort measures Outcome: Progressing   Problem: Health Behavior/Discharge Planning: Goal: Ability to manage health-related needs will improve Outcome: Progressing   Problem: Clinical Measurements: Goal: Ability to maintain clinical measurements within normal limits will improve Outcome: Progressing Goal: Will remain free from infection Outcome: Progressing Goal: Diagnostic test results will improve Outcome: Progressing Goal: Respiratory complications will improve Outcome: Progressing Goal: Cardiovascular complication will be avoided Outcome: Progressing   Problem: Activity: Goal: Risk for activity intolerance will decrease Outcome: Progressing   Problem: Nutrition: Goal: Adequate nutrition will be maintained Outcome: Progressing   Problem: Coping: Goal: Level of anxiety will decrease Outcome: Progressing   Problem: Elimination: Goal: Will not experience complications related to bowel motility Outcome: Progressing Goal: Will not experience complications related to urinary retention Outcome: Progressing   Problem: Pain Managment: Goal: General experience of comfort will improve Outcome: Progressing   Problem: Safety: Goal: Ability to remain free from injury will improve Outcome: Progressing   Problem: Skin Integrity: Goal: Risk for impaired skin integrity will decrease Outcome: Progressing  Patient more alert from this morning able to make some needs known patient has sitter at bedside for safety, bil floor mats in place, IV verifier placed on IV with levo infusing

## 2021-01-22 NOTE — Progress Notes (Signed)
PT Cancellation Note  Patient Details Name: Fernando [name] MRN: [id number] DOB: December 01, 1955   Cancelled Treatment:    Reason Eval/Treat Not Completed: Other (comment). Pt transferred to CCU d/t worsening agitation in setting of CIWA protocols. As pt has transferred to higher level of care, will require new orders to resume therapy. Please re-order when medically appropriate. Thank you.   Josclyn Rosales 01/22/2021, 9:57 AM Greggory Stallion, PT, DPT [phone]

## 2021-01-22 NOTE — Progress Notes (Addendum)
Cross Cover Patient with acute withdrawal, severly confused agitated and tachycardic.  No improvement after additional ativan doses.and escalating CIWA after transfer to stepdown.   Precedex orderd with heart rate in 150's, and agitation worsened

## 2021-01-22 NOTE — Consult Note (Signed)
NAME:  Fernando [name], MRN:  [id number], DOB:  1955-06-18, LOS: 2 ADMISSION DATE:  01/19/2021, CONSULTATION DATE:  01/22/21 REFERRING MD:  Sharion Settler NP  REASON FOR CONSULT:  Agitation   HPI  65 y.o  male with significant PMH of EtOH abuse who presented to the ED following a fall with injury to his forehead.  ED Course: On arrival to the ED, he was afebrile with blood pressure 103/66 mm Hg and pulse rate 99 beats/min.,  Respiration 18, oxygen saturation 90% on 2 L.  There were no focal neurological deficits; he was alert and oriented x4.  He was noted with 2 separate contusions on the forehead with 1 large hematoma and stellate laceration without hematoma.  Pertinent Labs/Diagnostics Findings: Lab Results  WBC 5.8 k/uL  Hgb 10.5 g/dL  Hct 28.1  Platelets 51  Na 125 mmol/L  K/Mag 2.7 mmol/L/1.1  Cl 87 mmol/L  CO2 22  mmol/L  BUN 10 mg/dL  Creatine 0.69  Glucose 100  AST/ALT 95/39  Anion Gap 16  Ethanol  340 mg/dL  COVID PCR NEGATIVE   EKG:  Date: 01/19/2021 EKG Time: 4:38 AM Rate: 97 Rhythm: normal sinus rhythm QRS Axis: normal Intervals: Right bundle branch block ST/T Wave abnormalities: Non-specific ST segment / T-wave changes, but no clear evidence of acute ischemia. Narrative Interpretation: no definitive evidence of acute ischemia; does not meet STEMI criteria. Imaging : CXR: No acute abnormality noted.  CT and CT cervical spine: Showed small subdural hematoma without midline shift or mass-effect. Patient was treated with a D5 normal saline, loaded with IV thiamine and started on CIWA protocol.  Given finding of CT as above neurosurgery was consulted with recommendation to repeat head CT in about 5 to 6 hours otherwise admitted to hospitalist service for close observation.  Patient was admitted to [facility] unit.  Hospital Course: During the course of his hospitalization, repeat CT scan was obtained and showed very stable small subdural hematoma.  Patient was cleared  by neurosurgery for DVT prophylaxis as needed in 72 hours while holding all antiplatelets x1 week.  Patient apparently had an episode of melena, GI consulted.  This morning patient was noted to repeat very agitated, anxious restless and confused with periods of hallucination, patient is trying to get out of the bed was very difficult to reorient.  Due to high risk of repeated falls, patient was transferred to ICU for Precedex drip.  PCCM consulted to help with management of agitation.  Past Medical History  EtOH abuse GI bleed Alcoholic ketoacidosis Chronic diastolic CHF Alcoholic cirrhosis of the liver without ascites Portal hypertensive gastropathy AKI Macrocytic anemia Tobacco abuse Thrombocytopenia  Significant Hospital Events   10/17: Admitted to MedSurg unit with subdural hematoma secondary to fall.  Neurosurgery consulted 10/18: Patient episode of melena, GI consulted 10/19: Transferred to the ICU due to severe agitation requiring Precedex drip.  PCCM consulted  Consults:  Neurosurgery GI PCCM  Procedures:  None  Significant Diagnostic Tests:  10/17: Chest Xray> no acute cardiopulmonary process 10/17: Noncontrast CT head>right frontal scalp hematoma and acute subdural hematoma of 4 mm  Micro Data:  10/17: SARS-CoV-2 PCR> negative 10/17: Influenza PCR> negative  Antimicrobials:  None  OBJECTIVE  Blood pressure 136/90, pulse (!) 150, temperature 98.3 F (36.8 C), temperature source Axillary, resp. rate (!) 29, height 5\' 5"  (1.651 m), weight 68 kg, SpO2 96 %.        Intake/Output Summary (Last 24 hours) at 01/22/2021 0553 Last data filed  at 01/22/2021 0530 Gross per 24 hour  Intake 101.08 ml  Output 3550 ml  Net -3448.92 ml   Filed Weights   01/19/21 0037  Weight: 68 kg   Physical Examination  GENERAL: 65 year-old critically ill patient lying in the bed restless EYES: Pupils equal, round, reactive to light and accommodation. No scleral icterus. Extraocular  muscles intact.  HEENT: Head traumatic, contusions on forehead.  Normocephalic. Oropharynx and nasopharynx clear.  NECK:  Supple, no jugular venous distention. No thyroid enlargement, no tenderness.  LUNGS: Normal breath sounds bilaterally, no wheezing, rales,rhonchi or crepitation. No use of accessory muscles of respiration.  CARDIOVASCULAR: S1, S2 normal. No murmurs, rubs, or gallops.  ABDOMEN: Soft, nontender, nondistended. Bowel sounds present. No organomegaly or mass.  EXTREMITIES: No pedal edema, cyanosis, or clubbing.  NEUROLOGIC: Cranial nerves II through XII are intact.  Moving all extremities  Sensation intact. Gait not checked.  PSYCHIATRIC: The patient is confused and agitated SKIN: No obvious rash, lesion, or ulcer.   Labs/imaging that I havepersonally reviewed  (right click and "Reselect all SmartList Selections" daily)     Labs   CBC: Recent Labs  Lab 01/19/21 0039 01/19/21 0236 01/19/21 1402 01/20/21 0453 01/20/21 0950 01/21/21 0504  WBC 5.8 6.9  --  4.0 4.4 5.8  NEUTROABS 3.4 4.0  --   --   --   --   HGB 10.5* 10.7* 9.1* 8.1* 8.2* 7.9*  HCT 28.1* 28.7*  --  23.4* 22.9* 21.9*  MCV 105.6* 105.5*  --  109.9* 111.2* 110.6*  PLT 51* 53*  --  29* 49* 65*    Basic Metabolic Panel: Recent Labs  Lab 01/19/21 0039 01/19/21 0236 01/19/21 1402 01/20/21 0453 01/21/21 0504  NA 125* 126*  --  133* 126*  K 2.7* 2.7* 3.4* 3.4* 4.0  CL 87* 87*  --  100 95*  CO2 22 21*  --  29 26  GLUCOSE 100* 101*  --  99 89  BUN 10 10  --  8 6*  CREATININE 0.69 0.80  --  0.53* 0.57*  CALCIUM 7.5* 7.6*  --  7.2* 7.9*  MG 1.1*  --  1.8 1.5* 1.6*  PHOS  --  2.5  --   --   --    GFR: Estimated Creatinine Clearance: 77.9 mL/min (A) (by C-G formula based on SCr of 0.57 mg/dL (L)). Recent Labs  Lab 01/19/21 0236 01/20/21 0453 01/20/21 0950 01/21/21 0504  WBC 6.9 4.0 4.4 5.8    Liver Function Tests: Recent Labs  Lab 01/19/21 0039  AST 95*  ALT 39  ALKPHOS 129*  BILITOT  1.7*  PROT 6.3*  ALBUMIN 3.1*   No results for input(s): LIPASE, AMYLASE in the last 168 hours. No results for input(s): AMMONIA in the last 168 hours.  ABG    Component Value Date/Time   PHART 7.52 (H) 12/18/2020 1822   PCO2ART 32 12/18/2020 1822   PO2ART 69 (L) 12/18/2020 1822   HCO3 26.1 12/18/2020 1822   TCO2 19 (L) 01/26/2020 0227   ACIDBASEDEF 12.1 (H) 12/18/2020 0920   O2SAT 95.4 12/18/2020 1822     Coagulation Profile: Recent Labs  Lab 01/20/21 0453  INR 1.1    Cardiac Enzymes: No results for input(s): CKTOTAL, CKMB, CKMBINDEX, TROPONINI in the last 168 hours.  HbA1C: Hgb A1c MFr Bld  Date/Time Value Ref Range Status  08/17/2020 12:33 AM 5.4 4.8 - 5.6 % Final    Comment:    (NOTE) Pre diabetes:  5.7%-6.4%  Diabetes:              >6.4%  Glycemic control for   <7.0% adults with diabetes     CBG: Recent Labs  Lab 01/22/21 0445  GLUCAP 109*    Review of Systems:   Patient is confused and agitated started on sedation  Past Medical History  He,  has a past medical history of Alcohol dependence ([place]), Arthritis, Cataract, Chronic systolic (congestive) heart failure ([place]), Dyslipidemia, Essential hypertension, History of kidney stones, Hyperlipidemia, Hypertension, Pneumonia, and Prosthetic shoulder infection, initial encounter ([place]).   Surgical History    Past Surgical History:  Procedure Laterality Date   BIOPSY  04/21/2020   Procedure: BIOPSY;  Surgeon: Rush Landmark Telford Nab., MD;  Location: [place];  Service: Gastroenterology;;   BIOPSY  08/17/2020   Procedure: BIOPSY;  Surgeon: Carol Ada, MD;  Location: [place];  Service: Endoscopy;;   COLONOSCOPY     COLONOSCOPY     multiple   ESOPHAGOGASTRODUODENOSCOPY N/A 12/20/2020   Procedure: ESOPHAGOGASTRODUODENOSCOPY (EGD);  Surgeon: Virgel Manifold, MD;  Location[facility] END[facility]COPY;  Service: Endoscopy;  Laterality: N/A;   ESOPHAGOGASTRODUODENOSCOPY (EGD) WITH PROPOFOL N/A  01/31/2020   Procedure: ESOPHAGOGASTRODUODENOSCOPY (EGD) WITH PROPOFOL;  Surgeon: Doran Stabler, MD;  Location: [place];  Service: Gastroenterology;  Laterality: N/A;   ESOPHAGOGASTRODUODENOSCOPY (EGD) WITH PROPOFOL N/A 04/21/2020   Procedure: ESOPHAGOGASTRODUODENOSCOPY (EGD) WITH PROPOFOL;  Surgeon: Rush Landmark Telford Nab., MD;  Location: [place];  Service: Gastroenterology;  Laterality: N/A;   ESOPHAGOGASTRODUODENOSCOPY (EGD) WITH PROPOFOL N/A 08/17/2020   Procedure: ESOPHAGOGASTRODUODENOSCOPY (EGD) WITH PROPOFOL;  Surgeon: Carol Ada, MD;  Location: [place];  Service: Endoscopy;  Laterality: N/A;   EUS N/A 04/21/2020   Procedure: UPPER ENDOSCOPIC ULTRASOUND (EUS) LINEAR;  Surgeon: Irving Copas., MD;  Location: [place];  Service: Gastroenterology;  Laterality: N/A;   [place]   left men. repair   KNEE SURGERY  1975   REVERSE SHOULDER ARTHROPLASTY Right 06/21/2018   Procedure: REVERSE SHOULDER ARTHROPLASTY;  Surgeon: Hiram Gash, MD;  Location: [facility];  Service: Orthopedics;  Laterality: Right;   ROTATOR CUFF REPAIR     right   ROTATOR CUFF REPAIR     TONSILLECTOMY     TOTAL SHOULDER REPLACEMENT  6712   UMBILICAL HERNIA REPAIR       Social History   reports that he has been smoking cigarettes. He has a 25.00 pack-year smoking history. He has never used smokeless tobacco. He reports current alcohol use of about 14.0 standard drinks per week. He reports that he does not currently use drugs after having used the following drugs: Marijuana.   Family History   His family history includes Heart failure in his mother; Lung cancer in his father. There is no history of Colon cancer, Esophageal cancer, Stomach cancer, Liver cancer, Pancreatic cancer, Rectal cancer, or Inflammatory bowel disease.   Allergies Allergies  Allergen Reactions   Atorvastatin     Other reaction(s): joints ache   Doxycycline     Other reaction(s):  rash/hive     Home Medications  Prior to Admission medications   Medication Sig Start Date End Date Taking? Authorizing Provider  omeprazole (PRILOSEC) 40 MG capsule Take 40 mg by mouth daily.   Yes [provider]  allopurinol (ZYLOPRIM) 100 MG tablet Take 100 mg by mouth daily. 08/09/18   [provider]  carvedilol (COREG) 3.125 MG tablet Take 1 tablet (3.125 mg  total) by mouth 2 (two) times daily. Do not take is Systolic BP is <144 Patient not taking: No sig reported 08/25/20   Pokhrel, Corrie Mckusick, MD  docusate sodium (COLACE) 100 MG capsule Take 1 capsule (100 mg total) by mouth 2 (two) times daily as needed for mild constipation. Patient not taking: No sig reported 08/25/20   Pokhrel, Corrie Mckusick, MD  folic acid (FOLVITE) 1 MG tablet Take 1 mg by mouth daily. Patient not taking: No sig reported 12/03/19   [provider]  furosemide (LASIX) 40 MG tablet Take 1 tablet (40 mg total) by mouth daily. Patient not taking: No sig reported 02/05/20 02/04/21  Debbe Odea, MD  losartan (COZAAR) 25 MG tablet Take 0.5 tablets (12.5 mg total) by mouth daily. DO not take if systolic blood pressure <818 Patient not taking: No sig reported 08/25/20   Pokhrel, Corrie Mckusick, MD  Multiple Vitamin (MULTIVITAMIN) tablet Take 1 tablet by mouth daily.  Patient not taking: No sig reported    [provider]  pantoprazole (PROTONIX) 40 MG tablet Take 1 tablet (40 mg total) by mouth 2 (two) times daily for 14 days, THEN 1 tablet (40 mg total) daily. Patient not taking: Reported on 01/19/2021 12/24/20 03/18/21  Flora Lipps, MD  thiamine 100 MG tablet Take 1 tablet (100 mg total) by mouth daily. Patient not taking: No sig reported 08/26/20   Flora Lipps, MD    Scheduled Meds:  acetaminophen  650 mg Oral Once   allopurinol  100 mg Oral Daily   carvedilol  6.25 mg Oral BID WC   cephALEXin  500 mg Oral Q8H   Chlorhexidine Gluconate Cloth  6 each Topical Daily   folic acid  1 mg Oral Daily    furosemide  40 mg Oral Daily   multivitamin with minerals  1 tablet Oral Daily   nicotine  21 mg Transdermal Daily   pantoprazole  40 mg Intravenous Q12H   senna-docusate  2 tablet Oral BID   sodium chloride  1 g Oral Q0600   thiamine  100 mg Oral Daily   Or   thiamine  100 mg Intravenous Daily   Continuous Infusions:  dexmedetomidine (PRECEDEX) IV infusion 0.2 mcg/kg/hr (01/22/21 0602)   multivitamin (MVI) IV infusion (LR or D5LR)     PRN Meds:.acetaminophen **OR** acetaminophen, budesonide (PULMICORT) nebulizer solution, HYDROcodone-acetaminophen, ipratropium-albuterol, LORazepam **OR** LORazepam, ondansetron **OR** ondansetron (ZOFRAN) IV   Active Hospital Problem list   Subdural hematoma EtOH abuse/withdrawal Acute metabolic encephalopathy GI bleed Hypokalemia Hypomagnesium Chronic diastolic congestive heart failure COPD Thrombocytopenia  Assessment & Plan:  Subdural hematoma secondary to fall  -Neurochecks per protocol -Fall precautions -Obtain STAT head CT for any new acute headache or new neurological deficits -Neurosurgery following, input appreciated  GI Bleed  Likely gastric erosions stress esophagitis and gastropathy in the setting of EtOH abuse PMHx: Macrocytic anemia, Esophagitis and gastric erosions with multiple EGDs, thrombocytopenia -IVF resuscitation to maintain MAP>65 -H&H monitoring q6h -Transfuse PRN Hgb<7 -Pantoprazole 40mg  IV  BID -NPO for now -Hold NSAIDs, steroids, ASA -Monitor s/s of bleeding -GI Following, no recommendation for EGD at this time we will continue to follow  EtOH Withdrawal  Hypokalemia Hypomagnesium Hyponatremia PMHx: EtOH Hepatitis -Start Precedex drip -Follow CMP, INR, Daily BMP+Mg -Replace electrolytes as indicated -Daily Thiamine, Folate, MVI once tolerating PO -SW consult for cessation resources -PT/OT evaluation for mobility -Diazepam 5 mg tid   Acute Metabolic Encephalopathy in the setting of EtOH  withdrawal +/- concussion with subdural hematoma -Provide supportive  care -Precedex as above   Chronic diastolic CHF EF 55 to 48% by echo on 08/2020 No evidence of CHF exacerbation -Hypertension -Continuous cardiac monitoring -Maintain MAP greater than 65 -IV Lasix as blood pressure and renal function permits; currently on Lasix 40 mg p.o. daily -Continue Coreg and losartan  Best practice:  Diet:  NPO Pain/Anxiety/Delirium protocol (if indicated): Yes (RASS goal -1) VAP protocol (if indicated): Not indicated DVT prophylaxis: Contraindicated GI prophylaxis: PPI Glucose control:  SSI No Central venous access:  N/A Arterial line:  N/A Foley:  N/A Mobility:  bed rest  PT consulted: N/A Last date of multidisciplinary goals of care discussion [10/17] Code Status:  full code Disposition: ICU   = Goals of Care = Code Status Order: FULL  Primary Emergency Contact: Shook,Vicki Wishes to pursue full aggressive treatment and intervention options, including CPR and intubation, but goals of care will be addressed on going with family if that should become necessary.  Critical care time: 45 minutes     Rufina Falco, DNP, CCRN, FNP-C, AGACNP-BC Acute Care Nurse Practitioner  [facility] Pager: [phone] [place] [facility]  .

## 2021-01-23 DIAGNOSIS — S065XAA Traumatic subdural hemorrhage with loss of consciousness status unknown, initial encounter: Secondary | ICD-10-CM | POA: Diagnosis not present

## 2021-01-23 DIAGNOSIS — F10931 Alcohol use, unspecified with withdrawal delirium: Secondary | ICD-10-CM | POA: Diagnosis not present

## 2021-01-23 LAB — BASIC METABOLIC PANEL
Anion gap: 6 (ref 5–15)
BUN: 10 mg/dL (ref 8–23)
CO2: 25 mmol/L (ref 22–32)
Calcium: 7.7 mg/dL — ABNORMAL LOW (ref 8.9–10.3)
Chloride: 95 mmol/L — ABNORMAL LOW (ref 98–111)
Creatinine, Ser: 0.82 mg/dL (ref 0.61–1.24)
GFR, Estimated: >60 mL/min (ref >60)
Glucose, Bld: 110 mg/dL — ABNORMAL HIGH (ref 70–99)
Potassium: 3.6 mmol/L (ref 3.5–5.1)
Sodium: 126 mmol/L — ABNORMAL LOW (ref 135–145)

## 2021-01-23 LAB — CBC WITH DIFFERENTIAL/PLATELET
Abs Immature Granulocytes: 0.01 10*3/uL (ref 0.00–0.07)
Basophils Absolute: 0 10*3/uL (ref 0.0–0.1)
Basophils Relative: 0 %
Eosinophils Absolute: 0 10*3/uL (ref 0.0–0.5)
Eosinophils Relative: 0 %
HCT: 21.6 % — ABNORMAL LOW (ref 39.0–52.0)
Hemoglobin: 7.7 g/dL — ABNORMAL LOW (ref 13.0–17.0)
Immature Granulocytes: 0 %
Lymphocytes Relative: 22 %
Lymphs Abs: 0.7 10*3/uL (ref 0.7–4.0)
MCH: 39.7 pg — ABNORMAL HIGH (ref 26.0–34.0)
MCHC: 35.6 g/dL (ref 30.0–36.0)
MCV: 111.3 fL — ABNORMAL HIGH (ref 80.0–100.0)
Monocytes Absolute: 0.7 10*3/uL (ref 0.1–1.0)
Monocytes Relative: 23 %
Neutro Abs: 1.7 10*3/uL (ref 1.7–7.7)
Neutrophils Relative %: 55 %
Platelets: 94 10*3/uL — ABNORMAL LOW (ref 150–400)
RBC: 1.94 MIL/uL — ABNORMAL LOW (ref 4.22–5.81)
RDW: 18.8 % — ABNORMAL HIGH (ref 11.5–15.5)
WBC: 3.1 10*3/uL — ABNORMAL LOW (ref 4.0–10.5)
nRBC: 0 % (ref 0.0–0.2)

## 2021-01-23 LAB — PHOSPHORUS: Phosphorus: 2.6 mg/dL (ref 2.5–4.6)

## 2021-01-23 LAB — MAGNESIUM: Magnesium: 1.6 mg/dL — ABNORMAL LOW (ref 1.7–2.4)

## 2021-01-23 MED ORDER — MAGNESIUM SULFATE 2 GM/50ML IV SOLN
2.0000 g | Freq: Once | INTRAVENOUS | Status: AC
  Administered 2021-01-23: 2 g via INTRAVENOUS
  Filled 2021-01-23: qty 50

## 2021-01-23 MED ORDER — MIDODRINE HCL 5 MG PO TABS
10.0000 mg | ORAL_TABLET | Freq: Three times a day (TID) | ORAL | Status: DC
  Administered 2021-01-23 – 2021-02-02 (×27): 10 mg via ORAL
  Filled 2021-01-23 (×28): qty 2

## 2021-01-23 NOTE — Progress Notes (Addendum)
NAME:  Fernando [name], MRN:  [id number], DOB:  09-22-1955, LOS: 3 ADMISSION DATE:  01/19/2021, CONSULTATION DATE:  01/22/21 REFERRING MD:  Fernando Settler NP  REASON FOR CONSULT:  Agitation    01/23/2021 APP Fernando [name]. I saw and evaluated the patient. Discussed with APP and agree with their findings and plan as documented below.  I have seen and evaluated the patient for EtOH withdrawal  S:  Off precedex. Doing well on scheduled valium. HDS off norepinephrine   O: Blood pressure 118/61, pulse 92, temperature 98.2 F (36.8 C), temperature source Oral, resp. rate 20, height 5\' 5"  (1.651 m), weight 150 lb (68 kg), SpO2 100 %.   Exam: Gen:       No acute distress. Resting in bed HEENT:  EOMI, tracking, sclera anicteric Neck:      No masses, JVD Lungs:    Clear to auscultation bilaterally; normal respiratory effort on room air CV:         Regular rate and rhythm; no murmurs Abd:      + bowel sounds; soft, non-tender; no palpable masses, no distension Ext:    No edema; adequate peripheral perfusion Skin:       Warm and dry; no rash Neuro:    alert and oriented x 3 Psych:    blunted affect   A/P:   #Acute encephalopathy 2/2 EtOH withdrawal --on scheduled valium, consider weaning in coming days. CIWA ordered. Continue thiamine, folate  #SDH --no operative intervention per NSG. Continue supportive care  #GIB --GI following. Attributed to likely erosive gastropathy. Continue PPI and monitor Hgb.  All other plans as per the APP note  I personally spent 28 minutes providing critical care not including any separately billable procedures   Fernando [name]   HPI  66 y.o  male with significant PMH of EtOH abuse who presented to the ED following a fall with injury to his forehead.  ED Course: On arrival to the ED, he was afebrile with blood pressure 103/66 mm Hg and pulse rate 99 beats/min.,  Respiration 18, oxygen saturation 90% on 2 L.  There were no  focal neurological deficits; he was alert and oriented x4.  He was noted with 2 separate contusions on the forehead with 1 large hematoma and stellate laceration without hematoma.  Pertinent Labs/Diagnostics Findings: Lab Results  WBC 5.8 k/uL  Hgb 10.5 g/dL  Hct 28.1  Platelets 51  Na 125 mmol/L  K/Mag 2.7 mmol/L/1.1  Cl 87 mmol/L  CO2 22  mmol/L  BUN 10 mg/dL  Creatine 0.69  Glucose 100  AST/ALT 95/39  Anion Gap 16  Ethanol  340 mg/dL  COVID PCR NEGATIVE   EKG:  Date: 01/19/2021 EKG Time: 4:38 AM Rate: 97 Rhythm: normal sinus rhythm QRS Axis: normal Intervals: Right bundle branch block ST/T Wave abnormalities: Non-specific ST segment / T-wave changes, but no clear evidence of acute ischemia. Narrative Interpretation: no definitive evidence of acute ischemia; does not meet STEMI criteria. Imaging : CXR: No acute abnormality noted.  CT and CT cervical spine: Showed small subdural hematoma without midline shift or mass-effect. Patient was treated with a D5 normal saline, loaded with IV thiamine and started on CIWA protocol.  Given finding of CT as above neurosurgery was consulted with recommendation to repeat head CT in about 5 to 6 hours otherwise admitted to hospitalist service for close observation.  Patient was admitted to [facility] unit.  Hospital Course: During the course of his hospitalization, repeat CT  scan was obtained and showed very stable small subdural hematoma.  Patient was cleared by neurosurgery for DVT prophylaxis as needed in 72 hours while holding all antiplatelets x1 week.  Patient apparently had an episode of melena, GI consulted.  This morning patient was noted to repeat very agitated, anxious restless and confused with periods of hallucination, patient is trying to get out of the bed was very difficult to reorient.  Due to high risk of repeated falls, patient was transferred to ICU for Precedex drip.  PCCM consulted to help with management of agitation.  Past  Medical History  EtOH abuse GI bleed Alcoholic ketoacidosis Chronic diastolic CHF Alcoholic cirrhosis of the liver without ascites Portal hypertensive gastropathy AKI Macrocytic anemia Tobacco abuse Thrombocytopenia  Significant Hospital Events   10/17: Admitted to MedSurg unit with subdural hematoma secondary to fall.  Neurosurgery consulted 10/18: Patient episode of melena, GI consulted 10/19: Transferred to the ICU due to severe agitation requiring Precedex drip.  PCCM consulted 10/21: Off Precedex and tolerating with scheduled Valium. Weaning Levophed, add Midodrine  Consults:  Neurosurgery GI PCCM  Procedures:  None  Significant Diagnostic Tests:  10/17: Chest Xray> no acute cardiopulmonary process 10/17: Noncontrast CT head>right frontal scalp hematoma and acute subdural hematoma of 4 mm  Micro Data:  10/17: SARS-CoV-2 PCR> negative 10/17: Influenza PCR> negative  Antimicrobials:  None  OBJECTIVE  Blood pressure 116/73, pulse (!) 102, temperature 98.7 F (37.1 C), temperature source Oral, resp. rate 19, height 5\' 5"  (1.651 m), weight 68 kg, SpO2 95 %.        Intake/Output Summary (Last 24 hours) at 01/23/2021 1653 Last data filed at 01/23/2021 1639 Gross per 24 hour  Intake 837.13 ml  Output 1075 ml  Net -237.87 ml    Filed Weights   01/19/21 0037  Weight: 68 kg   Physical Examination  GENERAL: 64 year-old acutely ill patient, sitting in the bed, in NAD EYES: Pupils equal, round, reactive to light and accommodation. No scleral icterus. Extraocular muscles intact.  HEENT: Head traumatic, contusions on forehead.  Normocephalic. Oropharynx and nasopharynx clear.  NECK:  Supple, no jugular venous distention. No thyroid enlargement, no tenderness.  LUNGS: Clear breath sounds bilaterally, no wheezing/rales/rhonchi/ or crepitation. No use of accessory muscles of respiration.  CARDIOVASCULAR: Tachycardia, regular rhythm, S1, S2 No murmurs, rubs, or gallops.   ABDOMEN: Soft, nontender, nondistended. Bowel sounds present. No organomegaly or mass.  EXTREMITIES: No pedal edema, cyanosis, or clubbing.  NEUROLOGIC: Awake and alert, oriented to person and place, follows commands, no focal deficits, speech clear, pupils PERRL PSYCHIATRIC: The patient is confused but cooperative SKIN: No obvious rash, lesion, or ulcer.   Labs/imaging that I havepersonally reviewed  (right click and "Reselect all SmartList Selections" daily)     Labs   CBC: Recent Labs  Lab 01/19/21 0039 01/19/21 0236 01/19/21 1402 01/20/21 0453 01/20/21 0950 01/21/21 0504 01/22/21 0547 01/23/21 0554  WBC 5.8 6.9  --  4.0 4.4 5.8 2.6* 3.1*  NEUTROABS 3.4 4.0  --   --   --   --  1.8 1.7  HGB 10.5* 10.7*   < > 8.1* 8.2* 7.9* 8.1* 7.7*  HCT 28.1* 28.7*  --  23.4* 22.9* 21.9* 22.1* 21.6*  MCV 105.6* 105.5*  --  109.9* 111.2* 110.6* 110.0* 111.3*  PLT 51* 53*  --  29* 49* 65* 69* 94*   < > = values in this interval not displayed.     Basic Metabolic Panel: Recent Labs  Lab 01/19/21 0236 01/19/21 1402 01/20/21 0453 01/21/21 0504 01/22/21 0547 01/22/21 1516 01/23/21 0554  NA 126*  --  133* 126* 124*  --  126*  K 2.7*   < > 3.4* 4.0 3.7 4.6 3.6  CL 87*  --  100 95* 90*  --  95*  CO2 21*  --  29 26 26   --  25  GLUCOSE 101*  --  99 89 100*  --  110*  BUN 10  --  8 6* 11  --  10  CREATININE 0.80  --  0.53* 0.57* 0.89  --  0.82  CALCIUM 7.6*  --  7.2* 7.9* 8.1*  --  7.7*  MG  --    < > 1.5* 1.6* 1.4* 2.2 1.6*  PHOS 2.5  --   --   --  1.3* 5.4* 2.6   < > = values in this interval not displayed.    GFR: Estimated Creatinine Clearance: 76 mL/min (by C-G formula based on SCr of 0.82 mg/dL). Recent Labs  Lab 01/20/21 0950 01/21/21 0504 01/22/21 0547 01/23/21 0554  WBC 4.4 5.8 2.6* 3.1*     Liver Function Tests: Recent Labs  Lab 01/19/21 0039  AST 95*  ALT 39  ALKPHOS 129*  BILITOT 1.7*  PROT 6.3*  ALBUMIN 3.1*    No results for input(s): LIPASE,  AMYLASE in the last 168 hours. No results for input(s): AMMONIA in the last 168 hours.  ABG    Component Value Date/Time   PHART 7.52 (H) 12/18/2020 1822   PCO2ART 32 12/18/2020 1822   PO2ART 69 (L) 12/18/2020 1822   HCO3 26.1 12/18/2020 1822   TCO2 19 (L) 01/26/2020 0227   ACIDBASEDEF 12.1 (H) 12/18/2020 0920   O2SAT 95.4 12/18/2020 1822      Coagulation Profile: Recent Labs  Lab 01/20/21 0453  INR 1.1     Cardiac Enzymes: No results for input(s): CKTOTAL, CKMB, CKMBINDEX, TROPONINI in the last 168 hours.  HbA1C: Hgb A1c MFr Bld  Date/Time Value Ref Range Status  08/17/2020 12:33 AM 5.4 4.8 - 5.6 % Final    Comment:    (NOTE) Pre diabetes:          5.7%-6.4%  Diabetes:              >6.4%  Glycemic control for   <7.0% adults with diabetes     CBG: Recent Labs  Lab 01/22/21 0445  GLUCAP 109*     Review of Systems:   Positives in BOLD: Gen: Denies fever, chills, weight change, fatigue, night sweats HEENT: Denies blurred vision, double vision, hearing loss, tinnitus, sinus congestion, rhinorrhea, sore throat, neck stiffness, dysphagia PULM: Denies shortness of breath, cough, sputum production, hemoptysis, wheezing CV: Denies chest pain, edema, orthopnea, paroxysmal nocturnal dyspnea, palpitations GI: Denies abdominal pain, nausea, vomiting, diarrhea, hematochezia, melena, constipation, change in bowel habits GU: Denies dysuria, hematuria, polyuria, oliguria, urethral discharge Endocrine: Denies hot or cold intolerance, polyuria, polyphagia or appetite change Derm: Denies rash, dry skin, scaling or peeling skin change Heme: Denies easy bruising, bleeding, bleeding gums Neuro: Denies headache, numbness, weakness, slurred speech, loss of memory or consciousness   Past Medical History  He,  has a past medical history of Alcohol dependence ([place]), Arthritis, Cataract, Chronic systolic (congestive) heart failure ([place]), Dyslipidemia, Essential hypertension,  History of kidney stones, Hyperlipidemia, Hypertension, Pneumonia, and Prosthetic shoulder infection, initial encounter ([place]).   Surgical History    Past Surgical History:  Procedure Laterality Date  BIOPSY  04/21/2020   Procedure: BIOPSY;  Surgeon: Rush Landmark Telford Nab., MD;  Location: [facility];  Service: Gastroenterology;;   BIOPSY  08/17/2020   Procedure: BIOPSY;  Surgeon: Carol Ada, MD;  Location: [facility];  Service: Endoscopy;;   COLONOSCOPY     COLONOSCOPY     multiple   ESOPHAGOGASTRODUODENOSCOPY N/A 12/20/2020   Procedure: ESOPHAGOGASTRODUODENOSCOPY (EGD);  Surgeon: Virgel Manifold, MD;  Location: [facility] ENDOSCOPY;  Service: Endoscopy;  Laterality: N/A;   ESOPHAGOGASTRODUODENOSCOPY (EGD) WITH PROPOFOL N/A 01/31/2020   Procedure: ESOPHAGOGASTRODUODENOSCOPY (EGD) WITH PROPOFOL;  Surgeon: Doran Stabler, MD;  Location: [facility];  Service: Gastroenterology;  Laterality: N/A;   ESOPHAGOGASTRODUODENOSCOPY (EGD) WITH PROPOFOL N/A 04/21/2020   Procedure: ESOPHAGOGASTRODUODENOSCOPY (EGD) WITH PROPOFOL;  Surgeon: Rush Landmark Telford Nab., MD;  Location: [facility];  Service: Gastroenterology;  Laterality: N/A;   ESOPHAGOGASTRODUODENOSCOPY (EGD) WITH PROPOFOL N/A 08/17/2020   Procedure: ESOPHAGOGASTRODUODENOSCOPY (EGD) WITH PROPOFOL;  Surgeon: Carol Ada, MD;  Location: [facility];  Service: Endoscopy;  Laterality: N/A;   EUS N/A 04/21/2020   Procedure: UPPER ENDOSCOPIC ULTRASOUND (EUS) LINEAR;  Surgeon: Irving Copas., MD;  Location: [facility];  Service: Gastroenterology;  Laterality: N/A;   [place]   left men. repair   KNEE SURGERY  1975   REVERSE SHOULDER ARTHROPLASTY Right 06/21/2018   Procedure: REVERSE SHOULDER ARTHROPLASTY;  Surgeon: Hiram Gash, MD;  Location: [facility];  Service: Orthopedics;  Laterality: Right;   ROTATOR CUFF REPAIR     right   ROTATOR CUFF REPAIR     TONSILLECTOMY     TOTAL SHOULDER  REPLACEMENT  7322   UMBILICAL HERNIA REPAIR       Social History   reports that he has been smoking cigarettes. He has a 25.00 pack-year smoking history. He has never used smokeless tobacco. He reports current alcohol use of about 14.0 standard drinks per week. He reports that he does not currently use drugs after having used the following drugs: Marijuana.   Family History   His family history includes Heart failure in his mother; Lung cancer in his father. There is no history of Colon cancer, Esophageal cancer, Stomach cancer, Liver cancer, Pancreatic cancer, Rectal cancer, or Inflammatory bowel disease.   Allergies Allergies  Allergen Reactions   Atorvastatin     Other reaction(s): joints ache   Doxycycline     Other reaction(s): rash/hive     Home Medications  Prior to Admission medications   Medication Sig Start Date End Date Taking? Authorizing Provider  omeprazole (PRILOSEC) 40 MG capsule Take 40 mg by mouth daily.   Yes [provider]  allopurinol (ZYLOPRIM) 100 MG tablet Take 100 mg by mouth daily. 08/09/18   [provider]  carvedilol (COREG) 3.125 MG tablet Take 1 tablet (3.125 mg total) by mouth 2 (two) times daily. Do not take is Systolic BP is <025 Patient not taking: No sig reported 08/25/20   Pokhrel, Corrie Mckusick, MD  docusate sodium (COLACE) 100 MG capsule Take 1 capsule (100 mg total) by mouth 2 (two) times daily as needed for mild constipation. Patient not taking: No sig reported 08/25/20   Pokhrel, Corrie Mckusick, MD  folic acid (FOLVITE) 1 MG tablet Take 1 mg by mouth daily. Patient not taking: No sig reported 12/03/19   [provider]  furosemide (LASIX) 40 MG tablet Take 1 tablet (40 mg total) by mouth daily. Patient not taking: No sig reported 02/05/20 02/04/21  Debbe Odea, MD  losartan (COZAAR) 25 MG tablet Take 0.5 tablets (12.5 mg total) by mouth daily. DO not take if systolic blood pressure <268 Patient not taking: No sig reported 08/25/20    Pokhrel, Corrie Mckusick, MD  Multiple Vitamin (MULTIVITAMIN) tablet Take 1 tablet by mouth daily.  Patient not taking: No sig reported    [provider]  pantoprazole (PROTONIX) 40 MG tablet Take 1 tablet (40 mg total) by mouth 2 (two) times daily for 14 days, THEN 1 tablet (40 mg total) daily. Patient not taking: Reported on 01/19/2021 12/24/20 03/18/21  Flora Lipps, MD  thiamine 100 MG tablet Take 1 tablet (100 mg total) by mouth daily. Patient not taking: No sig reported 08/26/20   Flora Lipps, MD    Scheduled Meds:  acetaminophen  650 mg Oral Once   allopurinol  100 mg Oral Daily   carvedilol  6.25 mg Oral BID WC   cephALEXin  500 mg Oral Q8H   Chlorhexidine Gluconate Cloth  6 each Topical Daily   diazepam  5 mg Intravenous T4H   folic acid  1 mg Oral Daily   furosemide  40 mg Oral Daily   midodrine  10 mg Oral TID WC   multivitamin with minerals  1 tablet Oral Daily   nicotine  21 mg Transdermal Daily   pantoprazole  40 mg Intravenous Q12H   senna-docusate  2 tablet Oral BID   sodium chloride  1 g Oral Q0600   thiamine  100 mg Oral Daily   Or   thiamine  100 mg Intravenous Daily   Continuous Infusions:  sodium chloride Stopped (01/22/21 0746)   dexmedetomidine (PRECEDEX) IV infusion Stopped (01/22/21 1448)   norepinephrine (LEVOPHED) Adult infusion Stopped (01/23/21 0942)   PRN Meds:.acetaminophen **OR** acetaminophen, budesonide (PULMICORT) nebulizer solution, HYDROcodone-acetaminophen, ipratropium-albuterol, LORazepam **OR** LORazepam, ondansetron **OR** ondansetron (ZOFRAN) IV   Active Hospital Problem list   Subdural hematoma EtOH abuse/withdrawal Acute metabolic encephalopathy GI bleed Hypokalemia Hypomagnesium Chronic diastolic congestive heart failure COPD Thrombocytopenia  Assessment & Plan:   Subdural hematoma secondary to fall  -Neurochecks per protocol -Fall precautions -Obtain STAT head CT for any new acute headache or new neurological  deficits -Neurosurgery following, input appreciated  GI Bleed  Likely gastric erosions stress esophagitis and gastropathy in the setting of EtOH abuse PMHx: Macrocytic anemia, Esophagitis and gastric erosions with multiple EGDs, thrombocytopenia -IVF resuscitation to maintain MAP>65 -H&H monitoring q6h -Transfuse PRN Hgb<7 -Pantoprazole 40mg  IV  BID -NPO for now -Hold NSAIDs, steroids, ASA -Monitor s/s of bleeding -GI Following, no recommendation for EGD at this time we will continue to follow  Hypokalemia ~ resolved Hypomagnesium Hyponatremia PMHx: EtOH Hepatitis -Monitor I&O's / urinary output -Follow BMP -Ensure adequate renal perfusion -Avoid nephrotoxic agents as able -Replace electrolytes as indicated  Acute Metabolic Encephalopathy in the setting of EtOH withdrawal +/- concussion with subdural hematoma -Provide supportive care -CIWA protocol -Scheduled Valium IV -Precedex if needed -Continue Thiamine, folic acid, MV   Chronic diastolic CHF EF 55 to 96% by echo on 08/2020 No evidence of CHF exacerbation -Hypertension -Continuous cardiac monitoring -Maintain MAP greater than 65 -Vasopressors as needed ~ weaned off earlier this morning -Add Midodrine -IV Lasix as blood pressure and renal function permits; currently on Lasix 40 mg p.o. daily -Continue Coreg and losartan  Best practice:  Diet:  Regular as tolerated Pain/Anxiety/Delirium protocol (if indicated): Valium, precedex if needed VAP protocol (if indicated): Not indicated DVT prophylaxis: Contraindicated GI prophylaxis: PPI Glucose control:  SSI No Central venous  access:  N/A Arterial line:  N/A Foley:  N/A Mobility:  as tolerated  PT consulted: yes Last date of multidisciplinary goals of care discussion [10/21] Code Status:  full code Disposition: ICU   Critical care time: 38 minutes    Darel Hong, AGACNP-BC [facility] epic messenger for cross cover needs If  after hours, please call E-link   .

## 2021-01-23 NOTE — Progress Notes (Signed)
0730 patient to CC attempted to pause levo based on BP increase in HR to mid 150s pt reports feeling increase in HR Hr decreased to mid 130s  0809 levo restarted HR remains in 130s and when trying to stand to commode HR 160s pt placed back in bed for safety

## 2021-01-23 NOTE — Progress Notes (Signed)
PROGRESS NOTE    Fernando [name]  CVE:[id number] DOB: 1955/05/21 DOA: 01/19/2021 PCP: [name], MD    Brief Narrative:  65 year old man with chronic systolic congestive heart failure, essential hypertension, hyperlipidemia, and alcohol abuse.  He presented after a fall and was able to crawl to his phone to call 911.  He was found to have a large laceration on his forehead which was sutured in the emergency room and a subdural hematoma.  Sutures will have to be removed in about 6 days.  Later developed multiple black bowel movements.  Had  platelet transfusion on 01/20/2021.  Patient also has severe hypomagnesemia and hypokalemia on presentation and electrolytes were replaced. Patient developed severe agitation with alcohol withdrawal early morning of 10/20, was placed on Precedex, he also required pressor support and transferred to ICU.  Condition improved again 10/21.  Assessment & Plan:   Principal Problem:   Acute subdural hematoma Active Problems:   Essential hypertension   Alcohol use disorder, moderate, dependence (HCC)   Alcoholic liver disease (HCC)   Chronic diastolic CHF (congestive heart failure) (HCC)   Hypokalemia   Hypomagnesemia   Laceration of forehead   Hematoma of frontal scalp   History of GI bleed   Subdural hematoma  Alcohol use disorder. Alcohol withdrawal with delirium tremens. Hypotension secondary to dehydration. Alcoholic hepatitis  Patient condition is improved again, currently patient is off pressor, off Precedex.  I will transfer him to regular medical floor. Patient currently is taking midodrine, blood pressures stable.  Anemia. Thrombocytopenia. Melena secondary to erosive esophagitis. Condition appears to be stable.  Patient will be followed by GI as outpatient. Patient has borderline B12 level, received a B12 injection, however, homocystine levels normal.  Patient does not have true B12 deficiency.  Head concussion with a forehead  laceration. Subdural hematoma. Ground-level fall. Condition stable.   Patient has been evaluated by PT/OT, recommended SNF placement.  Hypokalemia. Hypomagnesemia Hyponatremia secondary to SIADH. Continue replete magnesium. Recheck BMP tomorrow. Continue fluid restriction and 1 g salt tablet daily.  Patient is also on Lasix for chronic diastolic congestive heart failure.  Chronic diastolic congestive heart failure. COPD  Conditions are stable.   DVT prophylaxis: SCDs Code Status: full Family Communication:  Disposition Plan:    Status is: Inpatient  Remains inpatient appropriate because: Due to severity of disease, also unsafe discharge.        I/O last 3 completed shifts: In: 2453.4 [P.O.:680; I.V.:468.9; IV Piggyback:1304.5] Out: 2650 [Urine:2650] Total I/O In: 439.8 [P.O.:360; I.V.:29.8; IV Piggyback:50] Out: 250 [Urine:250]     Consultants:  GI  Procedures: None  Antimicrobials: Cephalexin. Subjective: Patient condition had improved, currently he no longer has any confusion.  No agitation. Denies any short of breath or cough. No dysuria hematuria  No headache or dizziness. No fever or chills.  Objective: Vitals:   01/23/21 1315 01/23/21 1330 01/23/21 1400 01/23/21 1430  BP:  (!) 101/57 (!) 102/56 (!) 92/53  Pulse: 100 92 98 92  Resp: (!) 24 20 (!) 24 (!) 25  Temp:   98.7 F (37.1 C)   TempSrc:   Oral   SpO2: 98% 99% 96%   Weight:      Height:        Intake/Output Summary (Last 24 hours) at 01/23/2021 1434 Last data filed at 01/23/2021 1300 Gross per 24 hour  Intake 1369.29 ml  Output 950 ml  Net 419.29 ml   Filed Weights   01/19/21 0037  Weight: 68 kg  Examination:  General exam: Appears calm and comfortable  Respiratory system: Clear to auscultation. Respiratory effort normal. Cardiovascular system: Irregular. No JVD, murmurs, rubs, gallops or clicks. No pedal edema. Gastrointestinal system: Abdomen is nondistended, soft and  nontender. No organomegaly or masses felt. Normal bowel sounds heard. Central nervous system: Alert and oriented. No focal neurological deficits. Extremities: Symmetric  Skin: No rashes, lesions or ulcers Psychiatry: Mood & affect appropriate.     Data Reviewed: I have personally reviewed following labs and imaging studies  CBC: Recent Labs  Lab 01/19/21 0039 01/19/21 0236 01/19/21 1402 01/20/21 0453 01/20/21 0950 01/21/21 0504 01/22/21 0547 01/23/21 0554  WBC 5.8 6.9  --  4.0 4.4 5.8 2.6* 3.1*  NEUTROABS 3.4 4.0  --   --   --   --  1.8 1.7  HGB 10.5* 10.7*   < > 8.1* 8.2* 7.9* 8.1* 7.7*  HCT 28.1* 28.7*  --  23.4* 22.9* 21.9* 22.1* 21.6*  MCV 105.6* 105.5*  --  109.9* 111.2* 110.6* 110.0* 111.3*  PLT 51* 53*  --  29* 49* 65* 69* 94*   < > = values in this interval not displayed.   Basic Metabolic Panel: Recent Labs  Lab 01/19/21 0236 01/19/21 1402 01/20/21 0453 01/21/21 0504 01/22/21 0547 01/22/21 1516 01/23/21 0554  NA 126*  --  133* 126* 124*  --  126*  K 2.7*   < > 3.4* 4.0 3.7 4.6 3.6  CL 87*  --  100 95* 90*  --  95*  CO2 21*  --  29 26 26   --  25  GLUCOSE 101*  --  99 89 100*  --  110*  BUN 10  --  8 6* 11  --  10  CREATININE 0.80  --  0.53* 0.57* 0.89  --  0.82  CALCIUM 7.6*  --  7.2* 7.9* 8.1*  --  7.7*  MG  --    < > 1.5* 1.6* 1.4* 2.2 1.6*  PHOS 2.5  --   --   --  1.3* 5.4* 2.6   < > = values in this interval not displayed.   GFR: Estimated Creatinine Clearance: 76 mL/min (by C-G formula based on SCr of 0.82 mg/dL). Liver Function Tests: Recent Labs  Lab 01/19/21 0039  AST 95*  ALT 39  ALKPHOS 129*  BILITOT 1.7*  PROT 6.3*  ALBUMIN 3.1*   No results for input(s): LIPASE, AMYLASE in the last 168 hours. No results for input(s): AMMONIA in the last 168 hours. Coagulation Profile: Recent Labs  Lab 01/20/21 0453  INR 1.1   Cardiac Enzymes: No results for input(s): CKTOTAL, CKMB, CKMBINDEX, TROPONINI in the last 168 hours. BNP (last 3  results) No results for input(s): PROBNP in the last 8760 hours. HbA1C: No results for input(s): HGBA1C in the last 72 hours. CBG: Recent Labs  Lab 01/22/21 0445  GLUCAP 109*   Lipid Profile: No results for input(s): CHOL, HDL, LDLCALC, TRIG, CHOLHDL, LDLDIRECT in the last 72 hours. Thyroid Function Tests: No results for input(s): TSH, T4TOTAL, FREET4, T3FREE, THYROIDAB in the last 72 hours. Anemia Panel: Recent Labs    01/21/21 0504 01/21/21 0827  VITAMINB12  --  398  TIBC 150*  --   IRON 82  --    Sepsis Labs: No results for input(s): PROCALCITON, LATICACIDVEN in the last 168 hours.  Recent Results (from the past 240 hour(s))  Resp Panel by RT-PCR (Flu A&B, Covid) Nasopharyngeal Swab     Status: None  Collection Time: 01/19/21  4:39 AM   Specimen: Nasopharyngeal Swab; Nasopharyngeal(NP) swabs in vial transport medium  Result Value Ref Range Status   SARS Coronavirus 2 by RT PCR NEGATIVE NEGATIVE Final    Comment: (NOTE) SARS-CoV-2 target nucleic acids are NOT DETECTED.  The SARS-CoV-2 RNA is generally detectable in upper respiratory specimens during the acute phase of infection. The lowest concentration of SARS-CoV-2 viral copies this assay can detect is 138 copies/mL. A negative result does not preclude SARS-Cov-2 infection and should not be used as the sole basis for treatment or other patient management decisions. A negative result may occur with  improper specimen collection/handling, submission of specimen other than nasopharyngeal swab, presence of viral mutation(s) within the areas targeted by this assay, and inadequate number of viral copies(<138 copies/mL). A negative result must be combined with clinical observations, patient history, and epidemiological information. The expected result is Negative.  Fact Sheet for Patients:  EntrepreneurPulse.com.au  Fact Sheet for Healthcare Providers:   IncredibleEmployment.be  This test is no t yet approved or cleared by the [place] FDA and  has been authorized for detection and/or diagnosis of SARS-CoV-2 by FDA under an Emergency Use Authorization (EUA). This EUA will remain  in effect (meaning this test can be used) for the duration of the COVID-19 declaration under Section 564(b)(1) of the Act, 21 U.S.C.section 360bbb-3(b)(1), unless the authorization is terminated  or revoked sooner.       Influenza A by PCR NEGATIVE NEGATIVE Final   Influenza B by PCR NEGATIVE NEGATIVE Final    Comment: (NOTE) The Xpert Xpress SARS-CoV-2/FLU/RSV plus assay is intended as an aid in the diagnosis of influenza from Nasopharyngeal swab specimens and should not be used as a sole basis for treatment. Nasal washings and aspirates are unacceptable for Xpert Xpress SARS-CoV-2/FLU/RSV testing.  Fact Sheet for Patients: EntrepreneurPulse.com.au  Fact Sheet for Healthcare Providers: IncredibleEmployment.be  This test is not yet approved or cleared by the [place] FDA and has been authorized for detection and/or diagnosis of SARS-CoV-2 by FDA under an Emergency Use Authorization (EUA). This EUA will remain in effect (meaning this test can be used) for the duration of the COVID-19 declaration under Section 564(b)(1) of the Act, 21 U.S.C. section 360bbb-3(b)(1), unless the authorization is terminated or revoked.  Performed at [facility], [address] [place], [place] [address]   MRSA Next Gen by PCR, Nasal     Status: None   Collection Time: 01/22/21  4:57 AM   Specimen: Nasal Mucosa; Nasal Swab  Result Value Ref Range Status   MRSA by PCR Next Gen NOT DETECTED NOT DETECTED Final    Comment: (NOTE) The GeneXpert MRSA Assay (FDA approved for NASAL specimens only), is one component of a comprehensive MRSA colonization surveillance program. It is not intended to  diagnose MRSA infection nor to guide or monitor treatment for MRSA infections. Test performance is not FDA approved in patients less than 32 years old. Performed at [facility], [address] [place], [place] [address]          Radiology Studies: [place] EKG SITE RITE  Result Date: 01/22/2021 If [facility] image not attached, placement could not be confirmed due to current cardiac rhythm.       Scheduled Meds:  acetaminophen  650 mg Oral Once   allopurinol  100 mg Oral Daily   carvedilol  6.25 mg Oral BID WC   cephALEXin  500 mg Oral Q8H   Chlorhexidine Gluconate  Cloth  6 each Topical Daily   diazepam  5 mg Intravenous I9S   folic acid  1 mg Oral Daily   furosemide  40 mg Oral Daily   midodrine  10 mg Oral TID WC   multivitamin with minerals  1 tablet Oral Daily   nicotine  21 mg Transdermal Daily   pantoprazole  40 mg Intravenous Q12H   senna-docusate  2 tablet Oral BID   sodium chloride  1 g Oral Q0600   thiamine  100 mg Oral Daily   Or   thiamine  100 mg Intravenous Daily   Continuous Infusions:  sodium chloride Stopped (01/22/21 0746)   dexmedetomidine (PRECEDEX) IV infusion Stopped (01/22/21 1448)   norepinephrine (LEVOPHED) Adult infusion Stopped (01/23/21 0942)     LOS: 3 days    Time spent: 32 minutes    Sharen Hones, MD [facility]   To contact the attending provider between 7A-7P or the covering provider during after hours 7P-7A, please log into the web site [email or web address] and access using universal [place] password for that web site. If you do not have the password, please call the hospital operator.  01/23/2021, 2:34 PM

## 2021-01-23 NOTE — TOC Initial Note (Signed)
Transition of Care [facility]) - Initial/Assessment Note    Patient Details  Name: EDWARDS MCKELVIE MRN: [id number] Date of Birth: Jan 22, 1956  Transition of Care [facility]) CM/SW Contact:    Kerin Salen, RN Phone Number: 01/23/2021, 3:06 PM  Clinical Narrative:  Patient NMS, confused sitter at bedside. Called and spoke with neighbor Zadie Cleverly who says patient lives alone, Son Naveed Humphres lives two hours away, [phone]. Jocelyn Lamer says [facility] [address] [address] Synetta Fail to shopping and medical appointments. Needs assistance at home due to heavy drinking, unable to do housework and frequent falls requiring EMS intervening because patient drinks and falls frequently and unable to get off the floor. Jocelyn Lamer says patient denied rehabilitation services before and not cooperative with [facility] services. Jocelyn Lamer says she hopes that he gets help. TOC to continue to track and assist as needed.                 Expected Discharge Plan: [place] (SNF recommended, patient declined) Barriers to Discharge: Continued Medical Work up   Patient Goals and CMS Choice     Choice offered to / list presented to : NA  Expected Discharge Plan and Services Expected Discharge Plan: [place] (SNF recommended, patient declined)   Discharge Planning Services: CM Consult Post Acute Care Choice: [place] arrangements for the past 2 months: Apartment                                      Prior Living Arrangements/Services Living arrangements for the past 2 months: Apartment Lives with:: Self Patient language and need for interpreter reviewed:: No Do you feel safe going back to the place where you live?: Yes      Need for Family Participation in Patient Care: Yes (Comment) Care giver support system in place?: No (comment) Current home services: Home PT, Home RN ([facility] open for PT and RN with [facility]) Criminal Activity/Legal Involvement Pertinent to  Current Situation/Hospitalization: No - Comment as needed  Activities of Daily Living Home Assistive Devices/Equipment: Environmental consultant (specify type) ADL Screening (condition at time of admission) Patient's cognitive ability adequate to safely complete daily activities?: Yes Is the patient deaf or have difficulty hearing?: No Does the patient have difficulty seeing, even when wearing glasses/contacts?: Yes Does the patient have difficulty concentrating, remembering, or making decisions?: No Patient able to express need for assistance with ADLs?: No Does the patient have difficulty dressing or bathing?: No Independently performs ADLs?: No Does the patient have difficulty walking or climbing stairs?: No Weakness of Legs: Both Weakness of Arms/Hands: None  Permission Sought/Granted Permission sought to share information with : Case Manager       Permission granted to share info w AGENCY: [facility] for RN and PT        Emotional Assessment Appearance:: Appears stated age Attitude/Demeanor/Rapport: Unable to Assess Affect (typically observed): Unable to Assess Orientation: : Oriented to Self, Oriented to Place, Oriented to  Time, Oriented to Situation Alcohol / Substance Use: Alcohol Use Psych Involvement: No (comment)  Admission diagnosis:  Hypokalemia [E56.3] Alcoholic ketoacidosis [J49.70] Hypomagnesemia [E83.42] Alcohol abuse [F10.10] Subdural hematoma [S06.5XAA] Fall, initial encounter [id number]] Laceration of scalp, initial encounter [S01.01XA] Hematoma of frontal scalp, initial encounter [Y63.78HY] Alcoholic intoxication with complication [facility]) [I50.277] Patient Active Problem List   Diagnosis Date Noted   Hypokalemia 01/19/2021  Hypomagnesemia 01/19/2021   Laceration of forehead 01/19/2021   Acute subdural hematoma 01/19/2021   Hematoma of frontal scalp 01/19/2021   History of GI bleed 01/19/2021   Subdural hematoma 01/19/2021   Alcohol abuse    Elevated AST  (SGOT)    Malnutrition of moderate degree 01/75/1025   Alcoholic ketoacidosis 85/27/7824   Chronic diastolic CHF (congestive heart failure) ([place]) 23/53/6144   Alcoholic cirrhosis of liver without ascites ([place])    Acute GI bleeding 08/16/2020   Abnormal CT of the abdomen 05/16/2020   Pancreatic disease 05/16/2020   Cyst of pancreas 05/16/2020   Acute on chronic combined systolic and diastolic CHF (congestive heart failure) ([place]) 02/05/2020   Anemia of chronic disease    Alcoholic liver disease ([place])    Portal hypertensive gastropathy (HCC)    Pancreatic mass    Sinus tachycardia    AKI (acute kidney injury) ([place]) 01/27/2020   Fall at home, initial encounter 01/26/2020   Hypotension 01/26/2020   Tobacco dependence 01/26/2020   Dislocation of prosthetic shoulder joint, initial encounter ([place])    Essential hypertension    Dyslipidemia    Alcohol use disorder, moderate, dependence ([place])    Pneumonitis    Generalized weakness 31/54/0086   Alcoholic hepatitis 76/19/5093   Sinus tachycardia 10/25/2019   Hyperammonemia ([place]) 10/25/2019   Delirium tremens ([place]) 10/25/2019   Acute hypoxemic respiratory failure ([place]) 10/25/2019   Macrocytic anemia 10/25/2019   Thrombocytopenia ([place]) 10/25/2019   Gout    Tobacco abuse    Stasis dermatitis of both legs 09/04/2018   Medication monitoring encounter 08/09/2018   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   Rotator cuff tear arthropathy, right 06/21/2018   PCP:  [name], MD Pharmacy[facility]   [id number] [place], [place] [address] [place] [address] Phone: [phone] Fax: [phone]     Social Determinants of Health (SDOH) Interventions    Readmission Risk Interventions Readmission Risk Prevention Plan 01/23/2021 12/19/2020  Transportation Screening Complete Complete  PCP or Specialist Appt within 3-5 Days - Complete  Social Work Consult for [place]  Planning/Counseling - Complete  Palliative Care Screening - Not Applicable  Medication Review Press photographer) Complete Complete  PCP or Specialist appointment within 3-5 days of discharge Not Complete -  PCP/Specialist Appt Not Complete comments NMS at this time. -  [place] or Home Care Consult Complete -  SW Recovery Care/Counseling Consult Complete -  Some recent data might be hidden

## 2021-01-24 DIAGNOSIS — S065XAA Traumatic subdural hemorrhage with loss of consciousness status unknown, initial encounter: Secondary | ICD-10-CM | POA: Diagnosis not present

## 2021-01-24 DIAGNOSIS — S0003XA Contusion of scalp, initial encounter: Secondary | ICD-10-CM | POA: Diagnosis not present

## 2021-01-24 LAB — PREPARE RBC (CROSSMATCH)

## 2021-01-24 LAB — CBC WITH DIFFERENTIAL/PLATELET
Abs Immature Granulocytes: 0.02 10*3/uL (ref 0.00–0.07)
Basophils Absolute: 0 10*3/uL (ref 0.0–0.1)
Basophils Relative: 0 %
Eosinophils Absolute: 0 10*3/uL (ref 0.0–0.5)
Eosinophils Relative: 1 %
HCT: 20 % — ABNORMAL LOW (ref 39.0–52.0)
Hemoglobin: 7.1 g/dL — ABNORMAL LOW (ref 13.0–17.0)
Immature Granulocytes: 1 %
Lymphocytes Relative: 23 %
Lymphs Abs: 0.7 10*3/uL (ref 0.7–4.0)
MCH: 40.1 pg — ABNORMAL HIGH (ref 26.0–34.0)
MCHC: 35.5 g/dL (ref 30.0–36.0)
MCV: 113 fL — ABNORMAL HIGH (ref 80.0–100.0)
Monocytes Absolute: 0.8 10*3/uL (ref 0.1–1.0)
Monocytes Relative: 25 %
Neutro Abs: 1.6 10*3/uL — ABNORMAL LOW (ref 1.7–7.7)
Neutrophils Relative %: 50 %
Platelets: 94 10*3/uL — ABNORMAL LOW (ref 150–400)
RBC: 1.77 MIL/uL — ABNORMAL LOW (ref 4.22–5.81)
RDW: 18.6 % — ABNORMAL HIGH (ref 11.5–15.5)
Smear Review: NORMAL
WBC: 3.2 10*3/uL — ABNORMAL LOW (ref 4.0–10.5)
nRBC: 0 % (ref 0.0–0.2)

## 2021-01-24 LAB — BASIC METABOLIC PANEL
Anion gap: 5 (ref 5–15)
BUN: 10 mg/dL (ref 8–23)
CO2: 24 mmol/L (ref 22–32)
Calcium: 7.8 mg/dL — ABNORMAL LOW (ref 8.9–10.3)
Chloride: 97 mmol/L — ABNORMAL LOW (ref 98–111)
Creatinine, Ser: 0.59 mg/dL — ABNORMAL LOW (ref 0.61–1.24)
GFR, Estimated: >60 mL/min (ref >60)
Glucose, Bld: 95 mg/dL (ref 70–99)
Potassium: 3.8 mmol/L (ref 3.5–5.1)
Sodium: 126 mmol/L — ABNORMAL LOW (ref 135–145)

## 2021-01-24 LAB — MAGNESIUM: Magnesium: 1.6 mg/dL — ABNORMAL LOW (ref 1.7–2.4)

## 2021-01-24 LAB — PHOSPHORUS: Phosphorus: 3 mg/dL (ref 2.5–4.6)

## 2021-01-24 MED ORDER — MAGNESIUM SULFATE 2 GM/50ML IV SOLN
2.0000 g | Freq: Once | INTRAVENOUS | Status: AC
  Administered 2021-01-24: 2 g via INTRAVENOUS
  Filled 2021-01-24: qty 50

## 2021-01-24 MED ORDER — SODIUM CHLORIDE 0.9% IV SOLUTION: Freq: Once | INTRAVENOUS | Status: AC

## 2021-01-24 NOTE — NC FL2 (Signed)
[place] LEVEL OF CARE SCREENING TOOL     IDENTIFICATION  Patient Name: Fernando [name] Birthdate: [birth year] Sex: male Admission Date (Current Location): 01/19/2021  [facility] and [place] Number:  Engineering geologist and Address:  [facility], [address] [place], [place] [address]      Provider Number: [id number]  Attending Physician Name and Address:  Sharen Hones, MD  Relative Name and Phone Number:  EMMETT, BRACKNELL     [phone]    Current Level of Care: Hospital Recommended Level of Care: [place] Prior Approval Number:    Date Approved/Denied:   PASRR Number: [id number]  Discharge Plan:      Current Diagnoses: Patient Active Problem List   Diagnosis Date Noted   Hypokalemia 01/19/2021   Hypomagnesemia 01/19/2021   Laceration of forehead 01/19/2021   Acute subdural hematoma 01/19/2021   Hematoma of frontal scalp 01/19/2021   History of GI bleed 01/19/2021   Subdural hematoma 01/19/2021   Alcohol abuse    Elevated AST (SGOT)    Malnutrition of moderate degree 27/78/2423   Alcoholic ketoacidosis 53/61/4431   Chronic diastolic CHF (congestive heart failure) ([place]) 54/00/8676   Alcoholic cirrhosis of liver without ascites ([place])    Acute GI bleeding 08/16/2020   Abnormal CT of the abdomen 05/16/2020   Pancreatic disease 05/16/2020   Cyst of pancreas 05/16/2020   Acute on chronic combined systolic and diastolic CHF (congestive heart failure) ([place]) 02/05/2020   Anemia of chronic disease    Alcoholic liver disease ([place])    Portal hypertensive gastropathy ([place])    Pancreatic mass    Sinus tachycardia    AKI (acute kidney injury) ([place]) 01/27/2020   Fall at home, initial encounter 01/26/2020   Hypotension 01/26/2020   Tobacco dependence 01/26/2020   Dislocation of prosthetic shoulder joint, initial encounter ([place])    Essential hypertension    Dyslipidemia    Alcohol use disorder, moderate, dependence  ([place])    Pneumonitis    Generalized weakness 19/50/9326   Alcoholic hepatitis 71/24/5809   Sinus tachycardia 10/25/2019   Hyperammonemia (HCC) 10/25/2019   Delirium tremens ([place]) 10/25/2019   Acute hypoxemic respiratory failure (HCC) 10/25/2019   Macrocytic anemia 10/25/2019   Thrombocytopenia ([place]) 10/25/2019   Gout    Tobacco abuse    Stasis dermatitis of both legs 09/04/2018   Medication monitoring encounter 08/09/2018   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 regular diet.)  AMBULATORY STATUS COMMUNICATION OF NEEDS Skin   Supervision Verbally Bruising                       Personal Care Assistance Level of Assistance  Bathing, Feeding, Dressing Bathing Assistance: Limited assistance Feeding assistance: Independent Dressing Assistance: Limited assistance     Functional Limitations Info             SPECIAL CARE FACTORS FREQUENCY  PT (By licensed PT), OT (By licensed OT)     PT Frequency: 5 times per week OT Frequency: 5 times per week            Contractures      Additional Factors  Info  Code Status, Allergies Code Status Info: full Allergies Info: atorvastatin, doxycycline           Current Medications (01/24/2021):  This is the current hospital active medication list Current Facility-Administered Medications  Medication Dose Route Frequency Provider Last Rate Last Admin   0.9 %  sodium chloride infusion  250 mL Intravenous Continuous Milus Banister, NP   Stopped at 01/22/21 0746   acetaminophen (TYLENOL) tablet 650 mg  650 mg Oral Q6H PRN Athena Masse, MD       Or   acetaminophen (TYLENOL) suppository 650 mg  650 mg Rectal Q6H  PRN Athena Masse, MD       acetaminophen (TYLENOL) tablet 650 mg  650 mg Oral Once Loletha Grayer, MD       allopurinol (ZYLOPRIM) tablet 100 mg  100 mg Oral Daily Loletha Grayer, MD   100 mg at 01/24/21 0950   budesonide (PULMICORT) nebulizer solution 0.5 mg  0.5 mg Nebulization BID PRN Sharen Hones, MD       carvedilol (COREG) tablet 6.25 mg  6.25 mg Oral BID WC Loletha Grayer, MD   6.25 mg at 01/24/21 0950   dexmedetomidine (PRECEDEX) 400 MCG/100ML (4 mcg/mL) infusion  0.4-1.2 mcg/kg/hr Intravenous Titrated Sharion Settler, NP   Stopped at 01/22/21 1448   diazepam (VALIUM) injection 5 mg  5 mg Intravenous Q6H Flora Lipps, MD   5 mg at 92/11/94 1740   folic acid (FOLVITE) tablet 1 mg  1 mg Oral Daily Judd Gaudier V, MD   1 mg at 01/24/21 0950   furosemide (LASIX) tablet 40 mg  40 mg Oral Daily Sharen Hones, MD   40 mg at 01/24/21 0950   HYDROcodone-acetaminophen (NORCO/VICODIN) 5-325 MG per tablet 1-2 tablet  1-2 tablet Oral Q4H PRN Dorothe Pea, RPH   2 tablet at 01/21/21 1958   ipratropium-albuterol (DUONEB) 0.5-2.5 (3) MG/3ML nebulizer solution 3 mL  3 mL Nebulization Q6H PRN Sharen Hones, MD       LORazepam (ATIVAN) tablet 1-4 mg  1-4 mg Oral Q1H PRN Sharion Settler, NP       Or   LORazepam (ATIVAN) injection 1-4 mg  1-4 mg Intravenous Q1H PRN Sharion Settler, NP   2 mg at 01/24/21 0244   magnesium sulfate IVPB 2 g 50 mL  2 g Intravenous Once Sharen Hones, MD       midodrine (PROAMATINE) tablet 10 mg  10 mg Oral TID WC Darel Hong D, NP   10 mg at 01/24/21 0950   multivitamin with minerals tablet 1 tablet  1 tablet Oral Daily Athena Masse, MD   1 tablet at 01/24/21 0950   nicotine (NICODERM CQ - dosed in mg/24 hours) patch 21 mg  21 mg Transdermal Daily Judd Gaudier V, MD   21 mg at 01/24/21 0501   norepinephrine (LEVOPHED) 4mg  in 237mL premix infusion  2-10 mcg/min Intravenous Titrated Milus Banister, NP   Paused at 01/23/21 0942   ondansetron (ZOFRAN) tablet 4 mg   4 mg Oral Q6H PRN Athena Masse, MD       Or   ondansetron [facility]) injection 4 mg  4 mg Intravenous Q6H PRN Athena Masse, MD       pantoprazole (PROTONIX) injection 40 mg  40 mg Intravenous Q12H Loletha Grayer, MD   40 mg at 01/24/21 0158   senna-docusate (Senokot-S) tablet 2 tablet  2 tablet Oral BID Sharen Hones, MD   2 tablet at  01/24/21 0950   sodium chloride tablet 1 g  1 g Oral Q0600 Sharen Hones, MD   1 g at 01/24/21 0503   thiamine tablet 100 mg  100 mg Oral Daily Hinda Kehr, MD   100 mg at 01/24/21 2035   Or   thiamine (B-1) injection 100 mg  100 mg Intravenous Daily Hinda Kehr, MD   100 mg at 01/22/21 5974     Discharge Medications: Please see discharge summary for a list of discharge medications.  Relevant Imaging Results:  Relevant Lab Results:   Additional Information SS #: [id number]  WOEHOZ Y [id number], LCSW

## 2021-01-24 NOTE — TOC Progression Note (Addendum)
Transition of Care [facility]) - Progression Note    Patient Details  Name: Fernando [name] MRN: [id number] Date of Birth: 08/03/1955  Transition of Care [facility]) CM/SW [place], LCSW Phone Number: 01/24/2021, 10:03 AM  Clinical Narrative:   Patient is disoriented.  Called patient's son, [name], to discuss SNF recommendation. [name] feels patient needs short term rehab at DC. [name] and patient's daughter will talk to patient to try to convince him to go to SNF.  Discussed options if patient refuses SNF or if we are unable to find a SNF to take patient. Patient lives alone. 24/7 supervision is recommended by PT. [name] stated he could stay with patient at home for a few days, but not long term. Patient has never had [place] in the past that [name] is aware of.  [name] is unsure if patient has had COVID vaccines. CSW will start SNF work up.     Expected Discharge Plan: [place] (SNF recommended, patient declined) Barriers to Discharge: Continued Medical Work up  Expected Discharge Plan and Services Expected Discharge Plan: [place] (SNF recommended, patient declined)   Discharge Planning Services: CM Consult Post Acute Care Choice: [place] arrangements for the past 2 months: Apartment                                       Social Determinants of Health (SDOH) Interventions    Readmission Risk Interventions Readmission Risk Prevention Plan 01/23/2021 12/19/2020  Transportation Screening Complete Complete  PCP or Specialist Appt within 3-5 Days - Complete  Social Work Consult for [place] Planning/Counseling - Complete  Palliative Care Screening - Not Applicable  Medication Review Press photographer) Complete Complete  PCP or Specialist appointment within 3-5 days of discharge Not Complete -  PCP/Specialist Appt Not Complete comments NMS at this time. -  [place] or Home Care Consult Complete -  SW Recovery Care/Counseling Consult  Complete -  Some recent data might be hidden

## 2021-01-24 NOTE — Progress Notes (Signed)
PROGRESS NOTE    Fernando [name]  EXN:[id number] DOB: 05-28-1955 DOA: 01/19/2021 PCP: [name], MD    Brief Narrative:   65 year old man with chronic systolic congestive heart failure, essential hypertension, hyperlipidemia, and alcohol abuse.  He presented after a fall and was able to crawl to his phone to call 911.  He was found to have a large laceration on his forehead which was sutured in the emergency room and a subdural hematoma.  Sutures will have to be removed in about 6 days.  Later developed multiple black bowel movements.  Had  platelet transfusion on 01/20/2021.  Patient also has severe hypomagnesemia and hypokalemia on presentation and electrolytes were replaced. Patient developed severe agitation with alcohol withdrawal early morning of 10/20, was placed on Precedex, he also required pressor support and transferred to ICU.  Condition improved again 10/21.  Assessment & Plan:   Principal Problem:   Acute subdural hematoma Active Problems:   Delirium tremens (HCC)   Essential hypertension   Alcohol use disorder, moderate, dependence (HCC)   Alcoholic liver disease (HCC)   Chronic diastolic CHF (congestive heart failure) (HCC)   Hypokalemia   Hypomagnesemia   Laceration of forehead   Hematoma of frontal scalp   History of GI bleed   Subdural hematoma  Pancytopenia Anemia. Thrombocytopenia. Melena secondary to erosive esophagitis. Has been seen by GI, no plan for work-up.  Hemoglobin dropped down to 7.1 today, does not have any rectal bleeding or black stool.  Will transfuse 1unit PRBC. Iron and B12 level are normal.  Homocystine level normal. Recheck CBC tomorrow, transfuse as needed.  Alcohol use disorder. Alcohol withdrawal with delirium tremens. Hypotension secondary to dehydration. Alcoholic hepatitis  Patient condition had improved, patient has no confusion today.  Head concussion with a forehead laceration. Subdural hematoma. Ground-level  fall. PT and OT recommended SNF placement, but the patient has refused.  Most likely will discharge home tomorrow with home care.    Hypokalemia. Hypomagnesemia Hyponatremia secondary to SIADH. Sodium level still low, continue salt tablets, continue fluid restriction.  Chronic diastolic congestive heart failure. COPD  Conditions are stable.   DVT prophylaxis: SCDs Code Status: full Family Communication: Son updated Disposition Plan:      Status is: Inpatient   Remains inpatient appropriate because: Due to severity of disease   I/O last 3 completed shifts: In: 842.2 [P.O.:640; I.V.:152.2; IV Piggyback:50] Out: 4967 [Urine:1075] Total I/O In: 240 [P.O.:240] Out: -     Consultants:  GI   Procedures: None   Antimicrobials: Cephalexin.    Subjective: Patient feels much better, no confusion today.  He wished to go home, will try to discharge tomorrow. He has no abdominal pain nausea vomiting.  No black stool or rectal bleeding. No short of breath or cough. No fever chills  Objective: Vitals:   01/24/21 0309 01/24/21 0329 01/24/21 0725 01/24/21 1124  BP: (!) 144/132 (!) 94/55 (!) 96/55 (!) 96/59  Pulse: 91 90 87 90  Resp: 20  18 20   Temp: 99.4 F (37.4 C)  97.9 F (36.6 C) (!) 97.3 F (36.3 C)  TempSrc: Oral  Oral Oral  SpO2: 96%  97% 93%  Weight:      Height:        Intake/Output Summary (Last 24 hours) at 01/24/2021 1233 Last data filed at 01/24/2021 1102 Gross per 24 hour  Intake 520 ml  Output 925 ml  Net -405 ml   Filed Weights   01/19/21 0037  Weight: 68 kg  Examination:  General exam: Appears calm and comfortable  Respiratory system: Clear to auscultation. Respiratory effort normal. Cardiovascular system: S1 & S2 heard, RRR. No JVD, murmurs, rubs, gallops or clicks. No pedal edema. Gastrointestinal system: Abdomen is nondistended, soft and nontender. No organomegaly or masses felt. Normal bowel sounds heard. Central nervous system:  Alert and oriented x3. No focal neurological deficits. Extremities: Symmetric 5 x 5 power. Skin: No rashes, lesions or ulcers Psychiatry: Mood & affect appropriate.     Data Reviewed: I have personally reviewed following labs and imaging studies  CBC: Recent Labs  Lab 01/19/21 0039 01/19/21 0236 01/19/21 1402 01/20/21 0950 01/21/21 0504 01/22/21 0547 01/23/21 0554 01/24/21 0413  WBC 5.8 6.9   < > 4.4 5.8 2.6* 3.1* 3.2*  NEUTROABS 3.4 4.0  --   --   --  1.8 1.7 1.6*  HGB 10.5* 10.7*   < > 8.2* 7.9* 8.1* 7.7* 7.1*  HCT 28.1* 28.7*   < > 22.9* 21.9* 22.1* 21.6* 20.0*  MCV 105.6* 105.5*   < > 111.2* 110.6* 110.0* 111.3* 113.0*  PLT 51* 53*   < > 49* 65* 69* 94* 94*   < > = values in this interval not displayed.   Basic Metabolic Panel: Recent Labs  Lab 01/19/21 0236 01/19/21 1402 01/20/21 0453 01/21/21 0504 01/22/21 0547 01/22/21 1516 01/23/21 0554 01/24/21 0413  NA 126*  --  133* 126* 124*  --  126* 126*  K 2.7*   < > 3.4* 4.0 3.7 4.6 3.6 3.8  CL 87*  --  100 95* 90*  --  95* 97*  CO2 21*  --  29 26 26   --  25 24  GLUCOSE 101*  --  99 89 100*  --  110* 95  BUN 10  --  8 6* 11  --  10 10  CREATININE 0.80  --  0.53* 0.57* 0.89  --  0.82 0.59*  CALCIUM 7.6*  --  7.2* 7.9* 8.1*  --  7.7* 7.8*  MG  --    < > 1.5* 1.6* 1.4* 2.2 1.6* 1.6*  PHOS 2.5  --   --   --  1.3* 5.4* 2.6 3.0   < > = values in this interval not displayed.   GFR: Estimated Creatinine Clearance: 77.9 mL/min (A) (by C-G formula based on SCr of 0.59 mg/dL (L)). Liver Function Tests: Recent Labs  Lab 01/19/21 0039  AST 95*  ALT 39  ALKPHOS 129*  BILITOT 1.7*  PROT 6.3*  ALBUMIN 3.1*   No results for input(s): LIPASE, AMYLASE in the last 168 hours. No results for input(s): AMMONIA in the last 168 hours. Coagulation Profile: Recent Labs  Lab 01/20/21 0453  INR 1.1   Cardiac Enzymes: No results for input(s): CKTOTAL, CKMB, CKMBINDEX, TROPONINI in the last 168 hours. BNP (last 3  results) No results for input(s): PROBNP in the last 8760 hours. HbA1C: No results for input(s): HGBA1C in the last 72 hours. CBG: Recent Labs  Lab 01/22/21 0445  GLUCAP 109*   Lipid Profile: No results for input(s): CHOL, HDL, LDLCALC, TRIG, CHOLHDL, LDLDIRECT in the last 72 hours. Thyroid Function Tests: No results for input(s): TSH, T4TOTAL, FREET4, T3FREE, THYROIDAB in the last 72 hours. Anemia Panel: No results for input(s): VITAMINB12, FOLATE, FERRITIN, TIBC, IRON, RETICCTPCT in the last 72 hours. Sepsis Labs: No results for input(s): PROCALCITON, LATICACIDVEN in the last 168 hours.  Recent Results (from the past 240 hour(s))  Resp Panel by RT-PCR (Flu  A&B, Covid) Nasopharyngeal Swab     Status: None   Collection Time: 01/19/21  4:39 AM   Specimen: Nasopharyngeal Swab; Nasopharyngeal(NP) swabs in vial transport medium  Result Value Ref Range Status   SARS Coronavirus 2 by RT PCR NEGATIVE NEGATIVE Final    Comment: (NOTE) SARS-CoV-2 target nucleic acids are NOT DETECTED.  The SARS-CoV-2 RNA is generally detectable in upper respiratory specimens during the acute phase of infection. The lowest concentration of SARS-CoV-2 viral copies this assay can detect is 138 copies/mL. A negative result does not preclude SARS-Cov-2 infection and should not be used as the sole basis for treatment or other patient management decisions. A negative result may occur with  improper specimen collection/handling, submission of specimen other than nasopharyngeal swab, presence of viral mutation(s) within the areas targeted by this assay, and inadequate number of viral copies(<138 copies/mL). A negative result must be combined with clinical observations, patient history, and epidemiological information. The expected result is Negative.  Fact Sheet for Patients:  EntrepreneurPulse.com.au  Fact Sheet for Healthcare Providers:  IncredibleEmployment.be  This  test is no t yet approved or cleared by the [place] FDA and  has been authorized for detection and/or diagnosis of SARS-CoV-2 by FDA under an Emergency Use Authorization (EUA). This EUA will remain  in effect (meaning this test can be used) for the duration of the COVID-19 declaration under Section 564(b)(1) of the Act, 21 U.S.C.section 360bbb-3(b)(1), unless the authorization is terminated  or revoked sooner.       Influenza A by PCR NEGATIVE NEGATIVE Final   Influenza B by PCR NEGATIVE NEGATIVE Final    Comment: (NOTE) The Xpert Xpress SARS-CoV-2/FLU/RSV plus assay is intended as an aid in the diagnosis of influenza from Nasopharyngeal swab specimens and should not be used as a sole basis for treatment. Nasal washings and aspirates are unacceptable for Xpert Xpress SARS-CoV-2/FLU/RSV testing.  Fact Sheet for Patients: EntrepreneurPulse.com.au  Fact Sheet for Healthcare Providers: IncredibleEmployment.be  This test is not yet approved or cleared by the [place] FDA and has been authorized for detection and/or diagnosis of SARS-CoV-2 by FDA under an Emergency Use Authorization (EUA). This EUA will remain in effect (meaning this test can be used) for the duration of the COVID-19 declaration under Section 564(b)(1) of the Act, 21 U.S.C. section 360bbb-3(b)(1), unless the authorization is terminated or revoked.  Performed at [facility], [address] [place], [place] [address]   MRSA Next Gen by PCR, Nasal     Status: None   Collection Time: 01/22/21  4:57 AM   Specimen: Nasal Mucosa; Nasal Swab  Result Value Ref Range Status   MRSA by PCR Next Gen NOT DETECTED NOT DETECTED Final    Comment: (NOTE) The GeneXpert MRSA Assay (FDA approved for NASAL specimens only), is one component of a comprehensive MRSA colonization surveillance program. It is not intended to diagnose MRSA infection nor to guide or monitor treatment  for MRSA infections. Test performance is not FDA approved in patients less than 67 years old. Performed at [facility], [address] [place], [place] [address]          Radiology Studies: [place] EKG SITE RITE  Result Date: 01/22/2021 If [facility] image not attached, placement could not be confirmed due to current cardiac rhythm.       Scheduled Meds:  sodium chloride   Intravenous Once   acetaminophen  650 mg Oral Once   allopurinol  100 mg Oral Daily  carvedilol  6.25 mg Oral BID WC   diazepam  5 mg Intravenous Q2E   folic acid  1 mg Oral Daily   furosemide  40 mg Oral Daily   midodrine  10 mg Oral TID WC   multivitamin with minerals  1 tablet Oral Daily   nicotine  21 mg Transdermal Daily   pantoprazole  40 mg Intravenous Q12H   senna-docusate  2 tablet Oral BID   sodium chloride  1 g Oral Q0600   thiamine  100 mg Oral Daily   Or   thiamine  100 mg Intravenous Daily   Continuous Infusions:  sodium chloride 250 mL (01/24/21 1206)   magnesium sulfate bolus IVPB 2 g (01/24/21 1209)     LOS: 4 days    Time spent: 27 minutes    Sharen Hones, MD [facility]   To contact the attending provider between 7A-7P or the covering provider during after hours 7P-7A, please log into the web site [email or web address] and access using universal [place] password for that web site. If you do not have the password, please call the hospital operator.  01/24/2021, 12:33 PM

## 2021-01-25 DIAGNOSIS — I5032 Chronic diastolic (congestive) heart failure: Secondary | ICD-10-CM | POA: Diagnosis not present

## 2021-01-25 DIAGNOSIS — S065XAA Traumatic subdural hemorrhage with loss of consciousness status unknown, initial encounter: Secondary | ICD-10-CM | POA: Diagnosis not present

## 2021-01-25 DIAGNOSIS — F10931 Alcohol use, unspecified with withdrawal delirium: Secondary | ICD-10-CM | POA: Diagnosis not present

## 2021-01-25 LAB — CBC WITH DIFFERENTIAL/PLATELET
Abs Immature Granulocytes: 0.03 10*3/uL (ref 0.00–0.07)
Basophils Absolute: 0 10*3/uL (ref 0.0–0.1)
Basophils Relative: 0 %
Eosinophils Absolute: 0 10*3/uL (ref 0.0–0.5)
Eosinophils Relative: 1 %
HCT: 22.5 % — ABNORMAL LOW (ref 39.0–52.0)
Hemoglobin: 7.9 g/dL — ABNORMAL LOW (ref 13.0–17.0)
Immature Granulocytes: 1 %
Lymphocytes Relative: 29 %
Lymphs Abs: 0.9 10*3/uL (ref 0.7–4.0)
MCH: 38.2 pg — ABNORMAL HIGH (ref 26.0–34.0)
MCHC: 35.1 g/dL (ref 30.0–36.0)
MCV: 108.7 fL — ABNORMAL HIGH (ref 80.0–100.0)
Monocytes Absolute: 0.7 10*3/uL (ref 0.1–1.0)
Monocytes Relative: 24 %
Neutro Abs: 1.4 10*3/uL — ABNORMAL LOW (ref 1.7–7.7)
Neutrophils Relative %: 45 %
Platelets: 123 10*3/uL — ABNORMAL LOW (ref 150–400)
RBC: 2.07 MIL/uL — ABNORMAL LOW (ref 4.22–5.81)
RDW: 19.3 % — ABNORMAL HIGH (ref 11.5–15.5)
WBC: 3.1 10*3/uL — ABNORMAL LOW (ref 4.0–10.5)
nRBC: 0 % (ref 0.0–0.2)

## 2021-01-25 LAB — TYPE AND SCREEN
ABO/RH(D): O POS
Antibody Screen: NEGATIVE
Unit division: 0

## 2021-01-25 LAB — BASIC METABOLIC PANEL
Anion gap: 7 (ref 5–15)
BUN: 13 mg/dL (ref 8–23)
CO2: 26 mmol/L (ref 22–32)
Calcium: 7.9 mg/dL — ABNORMAL LOW (ref 8.9–10.3)
Chloride: 95 mmol/L — ABNORMAL LOW (ref 98–111)
Creatinine, Ser: 0.72 mg/dL (ref 0.61–1.24)
GFR, Estimated: >60 mL/min (ref >60)
Glucose, Bld: 97 mg/dL (ref 70–99)
Potassium: 3.1 mmol/L — ABNORMAL LOW (ref 3.5–5.1)
Sodium: 128 mmol/L — ABNORMAL LOW (ref 135–145)

## 2021-01-25 LAB — BPAM RBC
Blood Product Expiration Date: 202211202359
ISSUE DATE / TIME: 202210221600
Unit Type and Rh: 5100

## 2021-01-25 LAB — PHOSPHORUS: Phosphorus: 4 mg/dL (ref 2.5–4.6)

## 2021-01-25 LAB — MAGNESIUM: Magnesium: 1.7 mg/dL (ref 1.7–2.4)

## 2021-01-25 MED ORDER — BISACODYL 5 MG PO TBEC
20.0000 mg | DELAYED_RELEASE_TABLET | Freq: Once | ORAL | Status: AC
  Administered 2021-01-25: 20 mg via ORAL
  Filled 2021-01-25: qty 4

## 2021-01-25 MED ORDER — POTASSIUM CHLORIDE 10 MEQ/100ML IV SOLN
10.0000 meq | INTRAVENOUS | Status: AC
Start: 2021-01-25 — End: 2021-01-25
  Administered 2021-01-25 (×3): 10 meq via INTRAVENOUS
  Filled 2021-01-25 (×3): qty 100

## 2021-01-25 MED ORDER — PEG 3350-KCL-NA BICARB-NACL 420 G PO SOLR
2000.0000 mL | Freq: Once | ORAL | Status: AC
  Administered 2021-01-25: 2000 mL via ORAL
  Filled 2021-01-25: qty 4000

## 2021-01-25 MED ORDER — PEG 3350-KCL-NA BICARB-NACL 420 G PO SOLR
2000.0000 mL | Freq: Once | ORAL | Status: DC | PRN
  Filled 2021-01-25: qty 4000

## 2021-01-25 NOTE — Progress Notes (Addendum)
[facility] Gastroenterology Inpatient Follow-up/Progress Note   Patient ID: Fernando [name] is a 65 y.o. male.  Overnight Events / Subjective Findings Recalled to see patient as his hgb has slowly declined to 7.1 as of yesterday. He did receive 1 u prbc with improvement to 7.9. He has remained hemodynamically stable.  Went through etoh withdrawal since last being seen by GI service. Resting in bed comfortably at this point and tolerated diet. He has eaten breakfast today.  Remains on iv protonix. Plt have improved to 123K as of today.  Does not know the color of his stool. Denies abdominal pain, n/v, hematemesis, coffee ground emesis.  No other acute gi complaints.  Review of Systems  Constitutional:  Negative for activity change, appetite change, chills, fatigue, fever and unexpected weight change.  HENT:  Negative for trouble swallowing and voice change.   Respiratory:  Negative for shortness of breath.   Cardiovascular:  Negative for chest pain and palpitations.  Gastrointestinal:  Negative for abdominal distention, abdominal pain, anal bleeding, blood in stool, constipation, diarrhea, nausea and vomiting.  Musculoskeletal:  Negative for arthralgias and myalgias.  Skin:  Negative for color change and pallor.  Neurological:  Negative for dizziness, syncope and weakness.  All other systems reviewed and are negative.   Medications  Current Facility-Administered Medications:    0.9 %  sodium chloride infusion, 250 mL, Intravenous, Continuous, Graves, Raeford Razor, NP, Last Rate: 10 mL/hr at 01/24/21 1206, 250 mL at 01/24/21 1206   acetaminophen (TYLENOL) tablet 650 mg, 650 mg, Oral, Q6H PRN **OR** acetaminophen (TYLENOL) suppository 650 mg, 650 mg, Rectal, Q6H PRN, Athena Masse, MD   acetaminophen (TYLENOL) tablet 650 mg, 650 mg, Oral, Once, Loletha Grayer, MD   allopurinol (ZYLOPRIM) tablet 100 mg, 100 mg, Oral, Daily, Leslye Peer, Richard, MD, 100 mg at 01/25/21 0949   bisacodyl (DULCOLAX)  EC tablet 20 mg, 20 mg, Oral, Once, [name], DO   budesonide (PULMICORT) nebulizer solution 0.5 mg, 0.5 mg, Nebulization, BID PRN, Sharen Hones, MD   carvedilol (COREG) tablet 6.25 mg, 6.25 mg, Oral, BID WC, Wieting, Richard, MD, 6.25 mg at 01/25/21 0951   diazepam (VALIUM) injection 5 mg, 5 mg, Intravenous, Q6H, Kasa, Kurian, MD, 5 mg at 95/62/13 0865   folic acid (FOLVITE) tablet 1 mg, 1 mg, Oral, Daily, Judd Gaudier V, MD, 1 mg at 01/25/21 0949   furosemide (LASIX) tablet 40 mg, 40 mg, Oral, Daily, Sharen Hones, MD, 40 mg at 01/25/21 0950   HYDROcodone-acetaminophen (NORCO/VICODIN) 5-325 MG per tablet 1-2 tablet, 1-2 tablet, Oral, Q4H PRN, Dorothe Pea, RPH, 2 tablet at 01/24/21 1848   ipratropium-albuterol (DUONEB) 0.5-2.5 (3) MG/3ML nebulizer solution 3 mL, 3 mL, Nebulization, Q6H PRN, Sharen Hones, MD   midodrine (PROAMATINE) tablet 10 mg, 10 mg, Oral, TID WC, Darel Hong D, NP, 10 mg at 01/25/21 7846   multivitamin with minerals tablet 1 tablet, 1 tablet, Oral, Daily, Athena Masse, MD, 1 tablet at 01/25/21 0948   nicotine (NICODERM CQ - dosed in mg/24 hours) patch 21 mg, 21 mg, Transdermal, Daily, Judd Gaudier V, MD, 21 mg at 01/25/21 0512   ondansetron (ZOFRAN) tablet 4 mg, 4 mg, Oral, Q6H PRN **OR** ondansetron (ZOFRAN) injection 4 mg, 4 mg, Intravenous, Q6H PRN, Athena Masse, MD   pantoprazole (PROTONIX) injection 40 mg, 40 mg, Intravenous, Q12H, Wieting, Richard, MD, 40 mg at 01/25/21 0134   polyethylene glycol-electrolytes (NuLYTELY) solution 2,000 mL, 2,000 mL, Oral, Once, [name], DO  polyethylene glycol-electrolytes (NuLYTELY) solution 2,000 mL, 2,000 mL, Oral, Once PRN, Kelcy, Baeten, DO   potassium chloride 10 mEq in 100 mL IVPB, 10 mEq, Intravenous, Q1 Hr x 3, Zhang, Danford Bad, MD, Last Rate: 100 mL/hr at 01/25/21 0955, 10 mEq at 01/25/21 0955   senna-docusate (Senokot-S) tablet 2 tablet, 2 tablet, Oral, BID, Sharen Hones, MD, 2  tablet at 01/25/21 0950   sodium chloride tablet 1 g, 1 g, Oral, Q0600, Sharen Hones, MD, 1 g at 01/25/21 0510   thiamine tablet 100 mg, 100 mg, Oral, Daily, 100 mg at 01/25/21 0949 **OR** thiamine (B-1) injection 100 mg, 100 mg, Intravenous, Daily, Hinda Kehr, MD, 100 mg at 01/22/21 0906  sodium chloride 250 mL (01/24/21 1206)   potassium chloride 10 mEq (01/25/21 0955)    acetaminophen **OR** acetaminophen, budesonide (PULMICORT) nebulizer solution, HYDROcodone-acetaminophen, ipratropium-albuterol, ondansetron **OR** ondansetron (ZOFRAN) IV, polyethylene glycol-electrolytes   Objective    Vitals:   01/24/21 1959 01/24/21 2112 01/25/21 0522 01/25/21 0714  BP: (!) 98/54 (!) 89/50 100/66 (!) 115/49  Pulse: 77 77 81 83  Resp: 14 18 18 12   Temp: 98 F (36.7 C) 97.9 F (36.6 C) 98 F (36.7 C) 98.6 F (37 C)  TempSrc: Oral Oral Oral Oral  SpO2: 100% 95% 96% 98%  Weight:      Height:         Physical Exam Vitals and nursing note reviewed.  Constitutional:      General: He is not in acute distress.    Appearance: He is normal weight. He is not toxic-appearing or diaphoretic.  HENT:     Head: Normocephalic.     Comments: Still with repaired laceration on forehead    Nose: Nose normal.     Mouth/Throat:     Mouth: Mucous membranes are moist.     Pharynx: Oropharynx is clear.  Eyes:     General: No scleral icterus.    Extraocular Movements: Extraocular movements intact.  Cardiovascular:     Rate and Rhythm: Normal rate and regular rhythm.     Heart sounds: Normal heart sounds. No murmur heard.   No friction rub. No gallop.  Pulmonary:     Effort: Pulmonary effort is normal. No respiratory distress.     Breath sounds: Wheezing (bilateral) present. No rhonchi or rales.  Abdominal:     General: Abdomen is flat. Bowel sounds are normal. There is no distension.     Palpations: Abdomen is soft.     Tenderness: There is no abdominal tenderness. There is no guarding or rebound.   Musculoskeletal:     Cervical back: Neck supple.     Right lower leg: No edema.     Left lower leg: No edema.  Skin:    General: Skin is warm and dry.     Coloration: Skin is not jaundiced or pale.  Neurological:     Mental Status: He is alert and oriented to person, place, and time.     Comments: Answers questions appropriately  Psychiatric:        Thought Content: Thought content normal.        Judgment: Judgment normal.     Laboratory Data Recent Labs  Lab 01/23/21 0554 01/24/21 0413 01/25/21 0531  WBC 3.1* 3.2* 3.1*  HGB 7.7* 7.1* 7.9*  HCT 21.6* 20.0* 22.5*  PLT 94* 94* 123*  NEUTOPHILPCT 55 50 45  LYMPHOPCT 22 23 29   MONOPCT 23 25 24   EOSPCT 0 1 1    Recent  Labs  Lab 01/19/21 0039 01/19/21 0236 01/23/21 0554 01/24/21 0413 01/25/21 0531  NA 125*   < > 126* 126* 128*  K 2.7*   < > 3.6 3.8 3.1*  CL 87*   < > 95* 97* 95*  CO2 22   < > 25 24 26   BUN 10   < > 10 10 13   CREATININE 0.69   < > 0.82 0.59* 0.72  CALCIUM 7.5*   < > 7.7* 7.8* 7.9*  PROT 6.3*  --   --   --   --   BILITOT 1.7*  --   --   --   --   ALKPHOS 129*  --   --   --   --   ALT 39  --   --   --   --   AST 95*  --   --   --   --   GLUCOSE 100*   < > 110* 95 97   < > = values in this interval not displayed.    Recent Labs  Lab 01/20/21 0453  INR 1.1       Imaging Studies: No results found.  Assessment:   # Melena - egd last month with gastric erosions, esophagitis and gastropathy - given his thrombocytopenia, these areas were likely oozing leading to dark stools and anemia. - His cell line production also suppressed 2/2 etoh use - rcd 1 u prbc 10/22 - hemodynamically stable - similar findings on EGD in May of this year and October of last year - BUN/Cr ratio 12.5 - h pylori negaive on previous egd   # anemia- macrocytic- pancytopenia - rcd 1 u prbc 10/22 - hgb slowly trending down to 7.1 now 7.9 post transfusion. - baseline appears to be around 9 - 2/2 to etoh bone  marrow suppression; - not iron deficient; sat 55%   # subdural hematoma- 51mm on CT - 2/2 syncopal episode with head strike- with laceration   # thrombocytopenia- improving - s/p transfusions - now up to 123K - 2/2 etoh use; no imaging findings of cirrhosis on multiple scans- CT and [place]   # EtOH abuse with withdrawal - level >300 on arrival - recovered from withdrawal after ICU stay  # Alcoholic hepatitis - AST 95; ALT 39 on presentation - resolving  # multiple electrolyte abnormalities             - hypo mag, hypokalemia hypo natremia  # h/o esophagitis and gastric erosions on multiple egds # h/o pancreatic cysts # tobacco dependence # HFpEF    Plan:  Plan for EGD and colonoscopy tomorrow Clear liquids today. Npo at midnight Ducolax and go lytely ordered; if stools not clear after first gallon, start second (ordered prn) Ok to drink colonoscopy prep after npo at midnight order Pt amenable to undergoing procedures On protonix 40 mg iv q12h Monitor h&h with transfusion and resuscitation as per primary team D/w neurosurgery and patient ok for sedation for procedures given small size of SDH. Appreciate assistance  - CIWA protocol and supportive care as per primary team - electrolyte correction as per primary team - recommend nsaid avoidance  Esophagogastroduodenoscopy and colonoscopy with possible biopsy, control of bleeding, polypectomy, and interventions as necessary has been discussed with the patient/patient representative. Informed consent was obtained from the patient/patient representative after explaining the indication, nature, and risks of the procedure including but not limited to death, bleeding, perforation, missed neoplasm/lesions, cardiorespiratory compromise, and reaction to medications. Opportunity  for questions was given and appropriate answers were provided. Patient/patient representative has verbalized understanding is amenable to undergoing the  procedure.  I personally performed the service.  Management of other medical comorbidities as per primary team  Thank you for allowing [place] to participate in this patient's care. Please don't hesitate to call if any questions or concerns arise.   [name], DO [facility] Gastroenterology  Portions of the record may have been created with voice recognition software. Occasional wrong-word or 'sound-a-like' substitutions may have occurred due to the inherent limitations of voice recognition software.  Read the chart carefully and recognize, using context, where substitutions may have occurred.

## 2021-01-25 NOTE — Plan of Care (Signed)
  Problem: Education: Goal: Knowledge of General Education information will improve Description Including pain rating scale, medication(s)/side effects and non-pharmacologic comfort measures Outcome: Progressing   

## 2021-01-25 NOTE — TOC Progression Note (Signed)
Transition of Care [facility]) - Progression Note    Patient Details  Name: Fernando [name] MRN: [id number] Date of Birth: 05/05/1955  Transition of Care [facility]) CM/SW [place], LCSW Phone Number: 01/25/2021, 12:50 PM  Clinical Narrative:   Patient was active with [facility] prior to admission.    Expected Discharge Plan: [place] (SNF recommended, patient declined) Barriers to Discharge: Continued Medical Work up  Expected Discharge Plan and Services Expected Discharge Plan: [place] (SNF recommended, patient declined)   Discharge Planning Services: CM Consult Post Acute Care Choice: [place] arrangements for the past 2 months: Apartment                                       Social Determinants of Health (SDOH) Interventions    Readmission Risk Interventions Readmission Risk Prevention Plan 01/23/2021 12/19/2020  Transportation Screening Complete Complete  PCP or Specialist Appt within 3-5 Days - Complete  Social Work Consult for [place] Planning/Counseling - Complete  Palliative Care Screening - Not Applicable  Medication Review Press photographer) Complete Complete  PCP or Specialist appointment within 3-5 days of discharge Not Complete -  PCP/Specialist Appt Not Complete comments NMS at this time. -  [place] or Home Care Consult Complete -  SW Recovery Care/Counseling Consult Complete -  Some recent data might be hidden

## 2021-01-25 NOTE — Progress Notes (Signed)
Safety rounder

## 2021-01-25 NOTE — Progress Notes (Signed)
PROGRESS NOTE    Fernando [name]  PNT:[id number] DOB: 1955/10/14 DOA: 01/19/2021 PCP: [name], MD    Brief Narrative:  65 year old man with chronic systolic congestive heart failure, essential hypertension, hyperlipidemia, and alcohol abuse.  He presented after a fall and was able to crawl to his phone to call 911.  He was found to have a large laceration on his forehead which was sutured in the emergency room and a subdural hematoma.  Sutures will have to be removed in about 6 days.  Later developed multiple black bowel movements.  Had  platelet transfusion on 01/20/2021.  Patient also has severe hypomagnesemia and hypokalemia on presentation and electrolytes were replaced. Patient developed severe agitation with alcohol withdrawal early morning of 10/20, was placed on Precedex, he also required pressor support and transferred to ICU.  Condition improved again 10/21.   Assessment & Plan:   Principal Problem:   Acute subdural hematoma Active Problems:   Delirium tremens (HCC)   Essential hypertension   Alcohol use disorder, moderate, dependence (HCC)   Alcoholic liver disease (HCC)   Chronic diastolic CHF (congestive heart failure) (HCC)   Hypokalemia   Hypomagnesemia   Laceration of forehead   Hematoma of frontal scalp   History of GI bleed   Subdural hematoma  Pancytopenia Anemia. Thrombocytopenia. Melena secondary to erosive esophagitis. Patient does not have deficiency of the B12 and iron.  He received 1 unit of PRBC yesterday, hemoglobin is still low but better.  Discussed with Dr. Virgina Jock planning for EGD/colonoscopy tomorrow.  Alcohol use disorder. Alcohol withdrawal with delirium tremens. Hypotension secondary to dehydration. Alcoholic hepatitis  Condition has resolved  Head concussion with a forehead laceration. Subdural hematoma. Ground-level fall. PT and OT recommended SNF placement, but the patient has refused.  Most likely will discharge home tomorrow  with home care. Will remove the sutures on his forehead.     Hypokalemia. Hypomagnesemia Hyponatremia secondary to SIADH. Continue fluid restriction and salt tablets.  Chronic diastolic congestive heart failure. COPD  Conditions are stable.   DVT prophylaxis: SCDs Code Status: full Family Communication: Son updated Disposition Plan:      Status is: Inpatient   Remains inpatient appropriate because: Due to severity of disease, pending procedures.         I/O last 3 completed shifts: In: 28 [P.O.:480; Blood:450; IV Piggyback:50] Out: 1000 [Urine:1000] Total I/O In: 220 [P.O.:220] Out: -      Consultants:  GI  Procedures: None  Antimicrobials: None  Subjective: Patient had a bowel movement today, no rectal bleeding or black stool. Denies any short of breath or cough. No dysuria hematuria  No fever or chills.  Objective: Vitals:   01/24/21 1959 01/24/21 2112 01/25/21 0522 01/25/21 0714  BP: (!) 98/54 (!) 89/50 100/66 (!) 115/49  Pulse: 77 77 81 83  Resp: 14 18 18 12   Temp: 98 F (36.7 C) 97.9 F (36.6 C) 98 F (36.7 C) 98.6 F (37 C)  TempSrc: Oral Oral Oral Oral  SpO2: 100% 95% 96% 98%  Weight:      Height:        Intake/Output Summary (Last 24 hours) at 01/25/2021 1201 Last data filed at 01/25/2021 1029 Gross per 24 hour  Intake 960 ml  Output 450 ml  Net 510 ml   Filed Weights   01/19/21 0037  Weight: 68 kg    Examination:  General exam: Appears calm and comfortable  Respiratory system: Clear to auscultation. Respiratory effort normal. Cardiovascular system: S1 &  S2 heard, RRR. No JVD, murmurs, rubs, gallops or clicks. No pedal edema. Gastrointestinal system: Abdomen is nondistended, soft and nontender. No organomegaly or masses felt. Normal bowel sounds heard. Central nervous system: Alert and oriented x2. No focal neurological deficits. Extremities: Symmetric 5 x 5 power. Skin: No rashes, lesions or ulcers Psychiatry:  Judgement and insight appear normal. Mood & affect appropriate.     Data Reviewed: I have personally reviewed following labs and imaging studies  CBC: Recent Labs  Lab 01/19/21 0236 01/19/21 1402 01/21/21 0504 01/22/21 0547 01/23/21 0554 01/24/21 0413 01/25/21 0531  WBC 6.9   < > 5.8 2.6* 3.1* 3.2* 3.1*  NEUTROABS 4.0  --   --  1.8 1.7 1.6* 1.4*  HGB 10.7*   < > 7.9* 8.1* 7.7* 7.1* 7.9*  HCT 28.7*   < > 21.9* 22.1* 21.6* 20.0* 22.5*  MCV 105.5*   < > 110.6* 110.0* 111.3* 113.0* 108.7*  PLT 53*   < > 65* 69* 94* 94* 123*   < > = values in this interval not displayed.   Basic Metabolic Panel: Recent Labs  Lab 01/21/21 0504 01/22/21 0547 01/22/21 1516 01/23/21 0554 01/24/21 0413 01/25/21 0531  NA 126* 124*  --  126* 126* 128*  K 4.0 3.7 4.6 3.6 3.8 3.1*  CL 95* 90*  --  95* 97* 95*  CO2 26 26  --  25 24 26   GLUCOSE 89 100*  --  110* 95 97  BUN 6* 11  --  10 10 13   CREATININE 0.57* 0.89  --  0.82 0.59* 0.72  CALCIUM 7.9* 8.1*  --  7.7* 7.8* 7.9*  MG 1.6* 1.4* 2.2 1.6* 1.6* 1.7  PHOS  --  1.3* 5.4* 2.6 3.0 4.0   GFR: Estimated Creatinine Clearance: 77.9 mL/min (by C-G formula based on SCr of 0.72 mg/dL). Liver Function Tests: Recent Labs  Lab 01/19/21 0039  AST 95*  ALT 39  ALKPHOS 129*  BILITOT 1.7*  PROT 6.3*  ALBUMIN 3.1*   No results for input(s): LIPASE, AMYLASE in the last 168 hours. No results for input(s): AMMONIA in the last 168 hours. Coagulation Profile: Recent Labs  Lab 01/20/21 0453  INR 1.1   Cardiac Enzymes: No results for input(s): CKTOTAL, CKMB, CKMBINDEX, TROPONINI in the last 168 hours. BNP (last 3 results) No results for input(s): PROBNP in the last 8760 hours. HbA1C: No results for input(s): HGBA1C in the last 72 hours. CBG: Recent Labs  Lab 01/22/21 0445  GLUCAP 109*   Lipid Profile: No results for input(s): CHOL, HDL, LDLCALC, TRIG, CHOLHDL, LDLDIRECT in the last 72 hours. Thyroid Function Tests: No results for  input(s): TSH, T4TOTAL, FREET4, T3FREE, THYROIDAB in the last 72 hours. Anemia Panel: No results for input(s): VITAMINB12, FOLATE, FERRITIN, TIBC, IRON, RETICCTPCT in the last 72 hours. Sepsis Labs: No results for input(s): PROCALCITON, LATICACIDVEN in the last 168 hours.  Recent Results (from the past 240 hour(s))  Resp Panel by RT-PCR (Flu A&B, Covid) Nasopharyngeal Swab     Status: None   Collection Time: 01/19/21  4:39 AM   Specimen: Nasopharyngeal Swab; Nasopharyngeal(NP) swabs in vial transport medium  Result Value Ref Range Status   SARS Coronavirus 2 by RT PCR NEGATIVE NEGATIVE Final    Comment: (NOTE) SARS-CoV-2 target nucleic acids are NOT DETECTED.  The SARS-CoV-2 RNA is generally detectable in upper respiratory specimens during the acute phase of infection. The lowest concentration of SARS-CoV-2 viral copies this assay can detect is 138 copies/mL.  A negative result does not preclude SARS-Cov-2 infection and should not be used as the sole basis for treatment or other patient management decisions. A negative result may occur with  improper specimen collection/handling, submission of specimen other than nasopharyngeal swab, presence of viral mutation(s) within the areas targeted by this assay, and inadequate number of viral copies(<138 copies/mL). A negative result must be combined with clinical observations, patient history, and epidemiological information. The expected result is Negative.  Fact Sheet for Patients:  EntrepreneurPulse.com.au  Fact Sheet for Healthcare Providers:  IncredibleEmployment.be  This test is no t yet approved or cleared by the [place] FDA and  has been authorized for detection and/or diagnosis of SARS-CoV-2 by FDA under an Emergency Use Authorization (EUA). This EUA will remain  in effect (meaning this test can be used) for the duration of the COVID-19 declaration under Section 564(b)(1) of the Act,  21 U.S.C.section 360bbb-3(b)(1), unless the authorization is terminated  or revoked sooner.       Influenza A by PCR NEGATIVE NEGATIVE Final   Influenza B by PCR NEGATIVE NEGATIVE Final    Comment: (NOTE) The Xpert Xpress SARS-CoV-2/FLU/RSV plus assay is intended as an aid in the diagnosis of influenza from Nasopharyngeal swab specimens and should not be used as a sole basis for treatment. Nasal washings and aspirates are unacceptable for Xpert Xpress SARS-CoV-2/FLU/RSV testing.  Fact Sheet for Patients: EntrepreneurPulse.com.au  Fact Sheet for Healthcare Providers: IncredibleEmployment.be  This test is not yet approved or cleared by the [place] FDA and has been authorized for detection and/or diagnosis of SARS-CoV-2 by FDA under an Emergency Use Authorization (EUA). This EUA will remain in effect (meaning this test can be used) for the duration of the COVID-19 declaration under Section 564(b)(1) of the Act, 21 U.S.C. section 360bbb-3(b)(1), unless the authorization is terminated or revoked.  Performed at [facility], [address] [place], [place] [address]   MRSA Next Gen by PCR, Nasal     Status: None   Collection Time: 01/22/21  4:57 AM   Specimen: Nasal Mucosa; Nasal Swab  Result Value Ref Range Status   MRSA by PCR Next Gen NOT DETECTED NOT DETECTED Final    Comment: (NOTE) The GeneXpert MRSA Assay (FDA approved for NASAL specimens only), is one component of a comprehensive MRSA colonization surveillance program. It is not intended to diagnose MRSA infection nor to guide or monitor treatment for MRSA infections. Test performance is not FDA approved in patients less than 75 years old. Performed at [facility], [address] [place], [place] [address]          Radiology Studies: No results found.      Scheduled Meds:  acetaminophen  650 mg Oral Once   allopurinol  100 mg Oral Daily    bisacodyl  20 mg Oral Once   carvedilol  6.25 mg Oral BID WC   diazepam  5 mg Intravenous B0F   folic acid  1 mg Oral Daily   furosemide  40 mg Oral Daily   midodrine  10 mg Oral TID WC   multivitamin with minerals  1 tablet Oral Daily   nicotine  21 mg Transdermal Daily   pantoprazole  40 mg Intravenous Q12H   polyethylene glycol-electrolytes  2,000 mL Oral Once   senna-docusate  2 tablet Oral BID   sodium chloride  1 g Oral Q0600   thiamine  100 mg Oral Daily   Or   thiamine  100 mg  Intravenous Daily   Continuous Infusions:  sodium chloride 250 mL (01/24/21 1206)     LOS: 5 days    Time spent: 27 minutes    Sharen Hones, MD [facility]   To contact the attending provider between 7A-7P or the covering provider during after hours 7P-7A, please log into the web site [email or web address] and access using universal [place] password for that web site. If you do not have the password, please call the hospital operator.  01/25/2021, 12:01 PM

## 2021-01-26 ENCOUNTER — Inpatient Hospital Stay: Payer: Medicare Other | Admitting: Anesthesiology

## 2021-01-26 ENCOUNTER — Encounter
Admission: EM | Disposition: A | Discharge status: Skilled Nursing Facility | Payer: Self-pay | Source: Home / Self Care | Attending: Internal Medicine

## 2021-01-26 ENCOUNTER — Encounter: Payer: Self-pay | Admitting: Internal Medicine

## 2021-01-26 DIAGNOSIS — I5032 Chronic diastolic (congestive) heart failure: Secondary | ICD-10-CM | POA: Diagnosis not present

## 2021-01-26 DIAGNOSIS — F10931 Alcohol use, unspecified with withdrawal delirium: Secondary | ICD-10-CM | POA: Diagnosis not present

## 2021-01-26 DIAGNOSIS — S065XAA Traumatic subdural hemorrhage with loss of consciousness status unknown, initial encounter: Secondary | ICD-10-CM | POA: Diagnosis not present

## 2021-01-26 HISTORY — PX: ESOPHAGOGASTRODUODENOSCOPY (EGD) WITH PROPOFOL: SHX5813

## 2021-01-26 LAB — CBC WITH DIFFERENTIAL/PLATELET
Abs Immature Granulocytes: 0.04 10*3/uL (ref 0.00–0.07)
Basophils Absolute: 0 10*3/uL (ref 0.0–0.1)
Basophils Relative: 0 %
Eosinophils Absolute: 0 10*3/uL (ref 0.0–0.5)
Eosinophils Relative: 1 %
HCT: 23.8 % — ABNORMAL LOW (ref 39.0–52.0)
Hemoglobin: 8.1 g/dL — ABNORMAL LOW (ref 13.0–17.0)
Immature Granulocytes: 1 %
Lymphocytes Relative: 21 %
Lymphs Abs: 0.7 10*3/uL (ref 0.7–4.0)
MCH: 37.5 pg — ABNORMAL HIGH (ref 26.0–34.0)
MCHC: 34 g/dL (ref 30.0–36.0)
MCV: 110.2 fL — ABNORMAL HIGH (ref 80.0–100.0)
Monocytes Absolute: 0.8 10*3/uL (ref 0.1–1.0)
Monocytes Relative: 25 %
Neutro Abs: 1.8 10*3/uL (ref 1.7–7.7)
Neutrophils Relative %: 52 %
Platelets: 142 10*3/uL — ABNORMAL LOW (ref 150–400)
RBC: 2.16 MIL/uL — ABNORMAL LOW (ref 4.22–5.81)
RDW: 19.5 % — ABNORMAL HIGH (ref 11.5–15.5)
WBC: 3.4 10*3/uL — ABNORMAL LOW (ref 4.0–10.5)
nRBC: 0 % (ref 0.0–0.2)

## 2021-01-26 LAB — BASIC METABOLIC PANEL
Anion gap: 8 (ref 5–15)
BUN: 9 mg/dL (ref 8–23)
CO2: 26 mmol/L (ref 22–32)
Calcium: 8.1 mg/dL — ABNORMAL LOW (ref 8.9–10.3)
Chloride: 95 mmol/L — ABNORMAL LOW (ref 98–111)
Creatinine, Ser: 0.74 mg/dL (ref 0.61–1.24)
GFR, Estimated: >60 mL/min (ref >60)
Glucose, Bld: 92 mg/dL (ref 70–99)
Potassium: 3.2 mmol/L — ABNORMAL LOW (ref 3.5–5.1)
Sodium: 129 mmol/L — ABNORMAL LOW (ref 135–145)

## 2021-01-26 LAB — PHOSPHORUS: Phosphorus: 3.5 mg/dL (ref 2.5–4.6)

## 2021-01-26 LAB — MAGNESIUM: Magnesium: 1.5 mg/dL — ABNORMAL LOW (ref 1.7–2.4)

## 2021-01-26 SURGERY — ESOPHAGOGASTRODUODENOSCOPY (EGD) WITH PROPOFOL
Anesthesia: General

## 2021-01-26 MED ORDER — POTASSIUM CHLORIDE 10 MEQ/100ML IV SOLN
10.0000 meq | INTRAVENOUS | Status: AC
Start: 2021-01-26 — End: 2021-01-26
  Administered 2021-01-26 (×4): 10 meq via INTRAVENOUS
  Filled 2021-01-26 (×4): qty 100

## 2021-01-26 MED ORDER — EPHEDRINE SULFATE 50 MG/ML IJ SOLN
INTRAMUSCULAR | Status: DC | PRN
  Administered 2021-01-26: 10 mg via INTRAVENOUS

## 2021-01-26 MED ORDER — MAGNESIUM SULFATE 4 GM/100ML IV SOLN
4.0000 g | Freq: Once | INTRAVENOUS | Status: AC
  Administered 2021-01-26: 4 g via INTRAVENOUS
  Filled 2021-01-26: qty 100

## 2021-01-26 MED ORDER — LIDOCAINE HCL (CARDIAC) PF 100 MG/5ML IV SOSY
PREFILLED_SYRINGE | INTRAVENOUS | Status: DC | PRN
  Administered 2021-01-26: 50 mg via INTRAVENOUS

## 2021-01-26 MED ORDER — DEXMEDETOMIDINE (PRECEDEX) IN NS 20 MCG/5ML (4 MCG/ML) IV SYRINGE
PREFILLED_SYRINGE | INTRAVENOUS | Status: DC | PRN
  Administered 2021-01-26: 8 ug via INTRAVENOUS

## 2021-01-26 MED ORDER — PROPOFOL 500 MG/50ML IV EMUL
INTRAVENOUS | Status: DC | PRN
  Administered 2021-01-26: 150 ug/kg/min via INTRAVENOUS

## 2021-01-26 MED ORDER — PHENYLEPHRINE HCL (PRESSORS) 10 MG/ML IV SOLN
INTRAVENOUS | Status: DC | PRN
  Administered 2021-01-26 (×2): 160 ug via INTRAVENOUS
  Administered 2021-01-26 (×2): 80 ug via INTRAVENOUS

## 2021-01-26 MED ORDER — SODIUM CHLORIDE 0.9 % IV SOLN: INTRAVENOUS | Status: DC

## 2021-01-26 MED ORDER — PROPOFOL 10 MG/ML IV BOLUS
INTRAVENOUS | Status: DC | PRN
  Administered 2021-01-26: 50 mg via INTRAVENOUS

## 2021-01-26 NOTE — Interval H&P Note (Signed)
History and Physical Interval Note: Inpatient Progress note from 01/26/21  was reviewed and there was no interval change after seeing and examining the patient.  Written consent was obtained from the patient after discussion of risks, benefits, and alternatives. Patient has consented to proceed with Esophagogastroduodenoscopy with possible intervention   01/26/2021 12:25 PM  Fernando [name]  has presented today for surgery, with the diagnosis of anemia; melena.  The various methods of treatment have been discussed with the patient and family. After consideration of risks, benefits and other options for treatment, the patient has consented to  Procedure(s): ESOPHAGOGASTRODUODENOSCOPY (EGD) WITH PROPOFOL (N/A) as a surgical intervention.  The patient's history has been reviewed, patient examined, no change in status, stable for surgery.  I have reviewed the patient's chart and labs.  Questions were answered to the patient's satisfaction.     [name]

## 2021-01-26 NOTE — Op Note (Signed)
[facility] Patient Name: Fernando [name] Procedure Date: 01/26/2021 12:14 PM MRN: [id number] Account #: [id number] Date of Birth: 1955-07-16 Admit Type: Outpatient Age: 65 Room: [facility] [place] ENDO ROOM 2 Gender: Male Note Status: Finalized Instrument Name: Upper Endoscope [id number] Procedure:             Upper GI endoscopy Indications:           Iron deficiency anemia secondary to chronic blood loss Providers:             [name] DO, DO Medicines:             Monitored Anesthesia Care Complications:         No immediate complications. Estimated blood loss: None. Procedure:             Pre-Anesthesia Assessment:                        - Prior to the procedure, a History and Physical was                         performed, and patient medications and allergies were                         reviewed. The patient is competent. The risks and                         benefits of the procedure and the sedation options and                         risks were discussed with the patient. All questions                         were answered and informed consent was obtained.                         Patient identification and proposed procedure were                         verified by the physician, the nurse, the anesthetist                         and the technician in the endoscopy suite. Mental                         Status Examination: alert and oriented. Airway                         Examination: normal oropharyngeal airway and neck                         mobility. Respiratory Examination: expiratory wheezes.                         CV Examination: RRR, no murmurs, no S3 or S4.                         Prophylactic Antibiotics: The patient does not require  prophylactic antibiotics. Prior Anticoagulants: The                         patient has taken no previous anticoagulant or                         antiplatelet agents. ASA Grade  Assessment: III - A                         patient with severe systemic disease. After reviewing                         the risks and benefits, the patient was deemed in                         satisfactory condition to undergo the procedure. The                         anesthesia plan was to use monitored anesthesia care                         (MAC). Immediately prior to administration of                         medications, the patient was re-assessed for adequacy                         to receive sedatives. The heart rate, respiratory                         rate, oxygen saturations, blood pressure, adequacy of                         pulmonary ventilation, and response to care were                         monitored throughout the procedure. The physical                         status of the patient was re-assessed after the                         procedure.                        After obtaining informed consent, the endoscope was                         passed under direct vision. Throughout the procedure,                         the patient's blood pressure, pulse, and oxygen                         saturations were monitored continuously. The Endoscope                         was introduced through the mouth, and advanced to the  second part of duodenum. The upper GI endoscopy was                         accomplished without difficulty. The patient tolerated                         the procedure well. Findings:      The Z-line was regular and was found 40 cm from the incisors.      Esophagogastric landmarks were identified: the gastroesophageal junction       was found at 40 cm from the incisors.      A widely patent Schatzki ring was found at the gastroesophageal junction.      Localized mild inflammation characterized by erythema was found in the       gastric fundus and in the gastric antrum. Not actively bleeding. no       blood seen in lumen. No  biopsies taken as these have been done on       previous EGD. Estimated blood loss: none.      Localized moderately erythematous mucosa without active bleeding and       with no stigmata of bleeding was found in the first portion of the       duodenum. Not actively bleeding. no blood seen in lumen. Impression:            - Z-line regular, 40 cm from the incisors.                        - Esophagogastric landmarks identified.                        - Widely patent Schatzki ring.                        - Gastritis.                        - Erythematous duodenopathy.                        - No specimens collected. Recommendation:        - Return patient to hospital ward for ongoing care.                        - Resume regular diet.                        - Continue present medications.                        - Telephone GI office- Dr. Henrene Pastor (patient's current                         outpatient gastroenterologist) to schedule appointment                         for colonoscopy as previously scheduled. Procedure Code(s):     --- Professional ---                        [phone], Esophagogastroduodenoscopy, flexible,  transoral; diagnostic, including collection of                         specimen(s) by brushing or washing, when performed                         (separate procedure) Diagnosis Code(s):     --- Professional ---                        K22.2, Esophageal obstruction                        K29.70, Gastritis, unspecified, without bleeding                        K31.89, Other diseases of stomach and duodenum                        D50.0, Iron deficiency anemia secondary to blood loss                         (chronic) CPT copyright 2019 [name]. All rights reserved. The codes documented in this report are preliminary and upon coder review may  be revised to meet current compliance requirements. Attending Participation:      I personally  performed the entire procedure. Fernando American, DO [name] DO, DO 01/26/2021 12:39:31 PM This report has been signed electronically. Number of Addenda: 0 Note Initiated On: 01/26/2021 12:14 PM Estimated Blood Loss:  Estimated blood loss: none.      [facility]

## 2021-01-26 NOTE — H&P (View-Only) (Signed)
[facility] Gastroenterology Inpatient Follow-up/Progress Note   Patient ID: Fernando [name] is a 65 y.o. male.  Overnight Events / Subjective Findings Patient did not complete prep. Stool is brown. Hgb stable at 8.1, grossly macrocytic. Will plan for egd today. Patient can follow up outpatient with primary GI for colonoscopy.  No other acute gi complaints.  Review of Systems  Constitutional:  Negative for activity change, appetite change, chills, fatigue, fever and unexpected weight change.  HENT:  Negative for trouble swallowing and voice change.   Respiratory:  Negative for shortness of breath.   Cardiovascular:  Negative for chest pain and palpitations.  Gastrointestinal:  Negative for abdominal distention, abdominal pain, anal bleeding, blood in stool, constipation, diarrhea, nausea and vomiting.  Musculoskeletal:  Negative for arthralgias and myalgias.  Skin:  Negative for color change and pallor.  Neurological:  Negative for dizziness, syncope and weakness.  All other systems reviewed and are negative.   Medications  Current Facility-Administered Medications:    0.9 %  sodium chloride infusion, 250 mL, Intravenous, Continuous, Graves, Raeford Razor, NP, Last Rate: 10 mL/hr at 01/24/21 1206, 250 mL at 01/24/21 1206   0.9 %  sodium chloride infusion, , Intravenous, Continuous, Newel, Oien, DO   acetaminophen (TYLENOL) tablet 650 mg, 650 mg, Oral, Q6H PRN **OR** acetaminophen (TYLENOL) suppository 650 mg, 650 mg, Rectal, Q6H PRN, Athena Masse, MD   acetaminophen (TYLENOL) tablet 650 mg, 650 mg, Oral, Once, Loletha Grayer, MD   allopurinol (ZYLOPRIM) tablet 100 mg, 100 mg, Oral, Daily, Leslye Peer, Richard, MD, 100 mg at 01/25/21 0949   budesonide (PULMICORT) nebulizer solution 0.5 mg, 0.5 mg, Nebulization, BID PRN, Sharen Hones, MD   carvedilol (COREG) tablet 6.25 mg, 6.25 mg, Oral, BID WC, Wieting, Richard, MD, 6.25 mg at 01/25/21 1840   diazepam (VALIUM) injection 5 mg, 5 mg,  Intravenous, Q6H, Kasa, Kurian, MD, 5 mg at 68/12/75 1700   folic acid (FOLVITE) tablet 1 mg, 1 mg, Oral, Daily, Judd Gaudier V, MD, 1 mg at 01/25/21 0949   furosemide (LASIX) tablet 40 mg, 40 mg, Oral, Daily, Sharen Hones, MD, 40 mg at 01/25/21 0950   HYDROcodone-acetaminophen (NORCO/VICODIN) 5-325 MG per tablet 1-2 tablet, 1-2 tablet, Oral, Q4H PRN, Dorothe Pea, RPH, 2 tablet at 01/24/21 1848   ipratropium-albuterol (DUONEB) 0.5-2.5 (3) MG/3ML nebulizer solution 3 mL, 3 mL, Nebulization, Q6H PRN, Sharen Hones, MD   midodrine (PROAMATINE) tablet 10 mg, 10 mg, Oral, TID WC, Darel Hong D, NP, 10 mg at 01/25/21 1841   multivitamin with minerals tablet 1 tablet, 1 tablet, Oral, Daily, Athena Masse, MD, 1 tablet at 01/25/21 0948   nicotine (NICODERM CQ - dosed in mg/24 hours) patch 21 mg, 21 mg, Transdermal, Daily, Judd Gaudier V, MD, 21 mg at 01/26/21 0514   ondansetron (ZOFRAN) tablet 4 mg, 4 mg, Oral, Q6H PRN **OR** ondansetron (ZOFRAN) injection 4 mg, 4 mg, Intravenous, Q6H PRN, Athena Masse, MD   pantoprazole (PROTONIX) injection 40 mg, 40 mg, Intravenous, Q12H, Wieting, Richard, MD, 40 mg at 01/26/21 0256   polyethylene glycol-electrolytes (NuLYTELY) solution 2,000 mL, 2,000 mL, Oral, Once PRN, [name], DO   potassium chloride 10 mEq in 100 mL IVPB, 10 mEq, Intravenous, Q1 Hr x 4, Zhang, Dekui, MD, Last Rate: 100 mL/hr at 01/26/21 1047, 10 mEq at 01/26/21 1047   senna-docusate (Senokot-S) tablet 2 tablet, 2 tablet, Oral, BID, Sharen Hones, MD, 2 tablet at 01/25/21 2100   sodium chloride tablet 1 g, 1  g, Oral, Q0600, Sharen Hones, MD, 1 g at 01/25/21 0510   thiamine tablet 100 mg, 100 mg, Oral, Daily, 100 mg at 01/25/21 0949 **OR** thiamine (B-1) injection 100 mg, 100 mg, Intravenous, Daily, Hinda Kehr, MD, 100 mg at 01/26/21 0945  sodium chloride 250 mL (01/24/21 1206)   sodium chloride     potassium chloride 10 mEq (01/26/21 1047)    acetaminophen **OR**  acetaminophen, budesonide (PULMICORT) nebulizer solution, HYDROcodone-acetaminophen, ipratropium-albuterol, ondansetron **OR** ondansetron (ZOFRAN) IV, polyethylene glycol-electrolytes   Objective    Vitals:   01/25/21 1928 01/26/21 0312 01/26/21 0710 01/26/21 1100  BP: (!) 104/55 (!) 104/57 102/63 102/74  Pulse: 76 80 92 84  Resp: 20 20 20    Temp: 98.3 F (36.8 C) 97.9 F (36.6 C) 99.5 F (37.5 C)   TempSrc: Oral Oral Oral   SpO2: 97% 91% 96%   Weight:      Height:         Physical Exam Vitals and nursing note reviewed.  Constitutional:      General: He is not in acute distress.    Appearance: He is normal weight. He is not toxic-appearing or diaphoretic.  HENT:     Head: Normocephalic.     Comments: Still with repaired laceration on forehead    Nose: Nose normal.     Mouth/Throat:     Mouth: Mucous membranes are moist.     Pharynx: Oropharynx is clear.  Eyes:     General: No scleral icterus.    Extraocular Movements: Extraocular movements intact.  Cardiovascular:     Rate and Rhythm: Normal rate and regular rhythm.     Heart sounds: Normal heart sounds. No murmur heard.   No friction rub. No gallop.  Pulmonary:     Effort: Pulmonary effort is normal. No respiratory distress.     Breath sounds: Wheezing (bilateral) present. No rhonchi or rales.  Abdominal:     General: Abdomen is flat. Bowel sounds are normal. There is no distension.     Palpations: Abdomen is soft.     Tenderness: There is no abdominal tenderness. There is no guarding or rebound.  Musculoskeletal:     Cervical back: Neck supple.     Right lower leg: No edema.     Left lower leg: No edema.  Skin:    General: Skin is warm and dry.     Coloration: Skin is not jaundiced or pale.  Neurological:     Mental Status: He is alert and oriented to person, place, and time.     Comments: Answers questions appropriately  Psychiatric:        Thought Content: Thought content normal.        Judgment:  Judgment normal.     Laboratory Data Recent Labs  Lab 01/24/21 0413 01/25/21 0531 01/26/21 0404  WBC 3.2* 3.1* 3.4*  HGB 7.1* 7.9* 8.1*  HCT 20.0* 22.5* 23.8*  PLT 94* 123* 142*  NEUTOPHILPCT 50 45 52  LYMPHOPCT 23 29 21   MONOPCT 25 24 25   EOSPCT 1 1 1     Recent Labs  Lab 01/24/21 0413 01/25/21 0531 01/26/21 0404  NA 126* 128* 129*  K 3.8 3.1* 3.2*  CL 97* 95* 95*  CO2 24 26 26   BUN 10 13 9   CREATININE 0.59* 0.72 0.74  CALCIUM 7.8* 7.9* 8.1*  GLUCOSE 95 97 92    Recent Labs  Lab 01/20/21 0453  INR 1.1       Imaging Studies: No results found.  Assessment:   # Melena - egd last month with gastric erosions, esophagitis and gastropathy - given his thrombocytopenia, these areas were likely oozing leading to dark stools and anemia. - His cell line production also suppressed 2/2 etoh use - rcd 1 u prbc 10/22 - hemodynamically stable - similar findings on EGD in May of this year and October of last year - BUN/Cr ratio 12.5 - h pylori negaive on previous egd   # anemia- macrocytic- pancytopenia - rcd 1 u prbc 10/22 - hgb 8.1 today - baseline appears to be around 9 - 2/2 to etoh bone marrow suppression; - not iron deficient; sat 55%   # subdural hematoma- 37mm on CT - 2/2 syncopal episode with head strike- with laceration   # thrombocytopenia- improving - s/p transfusions - now up to 123K - 2/2 etoh use; no imaging findings of cirrhosis on multiple scans- CT and [place]   # EtOH abuse with withdrawal - level >300 on arrival - recovered from withdrawal after ICU stay  # Alcoholic hepatitis - AST 95; ALT 39 on presentation - resolving  # multiple electrolyte abnormalities             - hypo mag, hypokalemia hypo natremia  # h/o esophagitis and gastric erosions on multiple egds # h/o pancreatic cysts # tobacco dependence # HFpEF    Plan:  Npo since midnight Did not complete prep Only egd today Labs reviewed Colonoscopy as outpatient with  primary gi On protonix 40 mg iv q12h Monitor h&h with transfusion and resuscitation as per primary team D/w neurosurgery and patient ok for sedation for procedures given small size of SDH. Appreciate assistance  Further recs pending endoscopy- please see report in epic  - CIWA protocol and supportive care as per primary team - electrolyte correction as per primary team - recommend nsaid avoidance  Esophagogastroduodenoscopy with possible biopsy, control of bleeding, polypectomy, and interventions as necessary has been discussed with the patient/patient representative. Informed consent was obtained from the patient/patient representative after explaining the indication, nature, and risks of the procedure including but not limited to death, bleeding, perforation, missed neoplasm/lesions, cardiorespiratory compromise, and reaction to medications. Opportunity for questions was given and appropriate answers were provided. Patient/patient representative has verbalized understanding is amenable to undergoing the procedure.  I personally performed the service.  Management of other medical comorbidities as per primary team  Thank you for allowing [place] to participate in this patient's care. Please don't hesitate to call if any questions or concerns arise.   [name], DO [facility]enter[facility]  Portions of the record may have been created with voice recognition software. Occasional wrong-word or 'sound-a-like' substitutions may have occurred due to the inherent limitations of voice recognition software.  Read the chart carefully and recognize, using context, where substitutions may have occurred.

## 2021-01-26 NOTE — Progress Notes (Signed)
PROGRESS NOTE    Fernando [name]  PFX:[id number] DOB: Aug 08, 1955 DOA: 01/19/2021 PCP: [name], MD    Brief Narrative:  65 year old man with chronic systolic congestive heart failure, essential hypertension, hyperlipidemia, and alcohol abuse.  He presented after a fall and was able to crawl to his phone to call 911.  He was found to have a large laceration on his forehead which was sutured in the emergency room and a subdural hematoma.  Sutures will have to be removed in about 6 days.  Later developed multiple black bowel movements.  Had  platelet transfusion on 01/20/2021.  Patient also has severe hypomagnesemia and hypokalemia on presentation and electrolytes were replaced. Patient developed severe agitation with alcohol withdrawal early morning of 10/20, was placed on Precedex, he also required pressor support and transferred to ICU.  Condition improved again 10/21.   Assessment & Plan:   Principal Problem:   Acute subdural hematoma Active Problems:   Delirium tremens (HCC)   Essential hypertension   Alcohol use disorder, moderate, dependence (HCC)   Alcoholic liver disease (HCC)   Chronic diastolic CHF (congestive heart failure) (HCC)   Hypokalemia   Hypomagnesemia   Laceration of forehead   Hematoma of frontal scalp   History of GI bleed   Subdural hematoma  Pancytopenia Anemia. Thrombocytopenia. Melena secondary to gastritis. EGD performed today showed gastritis.  No active bleeding.  Continue PPI for now. Patient does not have iron and B12 deficiency.  Alcohol use disorder. Alcohol withdrawal with delirium tremens. Hypotension secondary to dehydration. Alcoholic hepatitis  Condition had essentially resolved.  Head concussion with a forehead laceration. Subdural hematoma. Ground-level fall. Conditions are stable.  Hypokalemia. Hypomagnesemia Hyponatremia secondary to SIADH. Sodium level improving, continue replete potassium and magnesium.  Check levels  tomorrow.  Chronic diastolic congestive heart failure. COPD  stable   DVT prophylaxis: SCDs Code Status: full Family Communication: Son updated Disposition Plan:      Status is: Inpatient   Remains inpatient appropriate because: Unsafe discharge.  Patient will need nursing home placement.  Patient and his son both agreeable to it.        I/O last 3 completed shifts: In: 58 [P.O.:460; Blood:450; IV Piggyback:200] Out: 3299 [Urine:1450] Total I/O In: 200 [I.V.:200] Out: 150 [Urine:150]     Consultants:  GI  Procedures: EGD  Antimicrobials: None  Subjective: Patient much improved, currently does not have any complaints.  Good appetite without nausea vomiting. No dysuria hematuria P No fever chills  No short of breath or cough  Objective: Vitals:   01/26/21 1142 01/26/21 1240 01/26/21 1250 01/26/21 1300  BP: (!) 98/55 (!) 79/46 117/68 (!) 121/57  Pulse: 84     Resp: 16     Temp: 98.8 F (37.1 C) 98.3 F (36.8 C)    TempSrc: Temporal Temporal    SpO2: 98% 94%    Weight: 68 kg     Height: 5\' 5"  (1.651 m)       Intake/Output Summary (Last 24 hours) at 01/26/2021 1320 Last data filed at 01/26/2021 1246 Gross per 24 hour  Intake 640 ml  Output 1150 ml  Net -510 ml   Filed Weights   01/19/21 0037 01/26/21 1142  Weight: 68 kg 68 kg    Examination:  General exam: Appears calm and comfortable  Respiratory system: Clear to auscultation. Respiratory effort normal. Cardiovascular system: S1 & S2 heard, RRR. No JVD, murmurs, rubs, gallops or clicks. No pedal edema. Gastrointestinal system: Abdomen is nondistended, soft and  nontender. No organomegaly or masses felt. Normal bowel sounds heard. Central nervous system: Alert and oriented x3. No focal neurological deficits. Extremities: Symmetric 5 x 5 power. Skin: No rashes, lesions or ulcers Psychiatry: Judgement and insight appear normal. Mood & affect appropriate.     Data Reviewed: I have  personally reviewed following labs and imaging studies  CBC: Recent Labs  Lab 01/22/21 0547 01/23/21 0554 01/24/21 0413 01/25/21 0531 01/26/21 0404  WBC 2.6* 3.1* 3.2* 3.1* 3.4*  NEUTROABS 1.8 1.7 1.6* 1.4* 1.8  HGB 8.1* 7.7* 7.1* 7.9* 8.1*  HCT 22.1* 21.6* 20.0* 22.5* 23.8*  MCV 110.0* 111.3* 113.0* 108.7* 110.2*  PLT 69* 94* 94* 123* 161*   Basic Metabolic Panel: Recent Labs  Lab 01/22/21 0547 01/22/21 1516 01/23/21 0554 01/24/21 0413 01/25/21 0531 01/26/21 0404  NA 124*  --  126* 126* 128* 129*  K 3.7 4.6 3.6 3.8 3.1* 3.2*  CL 90*  --  95* 97* 95* 95*  CO2 26  --  25 24 26 26   GLUCOSE 100*  --  110* 95 97 92  BUN 11  --  10 10 13 9   CREATININE 0.89  --  0.82 0.59* 0.72 0.74  CALCIUM 8.1*  --  7.7* 7.8* 7.9* 8.1*  MG 1.4* 2.2 1.6* 1.6* 1.7 1.5*  PHOS 1.3* 5.4* 2.6 3.0 4.0 3.5   GFR: Estimated Creatinine Clearance: 77.9 mL/min (by C-G formula based on SCr of 0.74 mg/dL). Liver Function Tests: No results for input(s): AST, ALT, ALKPHOS, BILITOT, PROT, ALBUMIN in the last 168 hours. No results for input(s): LIPASE, AMYLASE in the last 168 hours. No results for input(s): AMMONIA in the last 168 hours. Coagulation Profile: Recent Labs  Lab 01/20/21 0453  INR 1.1   Cardiac Enzymes: No results for input(s): CKTOTAL, CKMB, CKMBINDEX, TROPONINI in the last 168 hours. BNP (last 3 results) No results for input(s): PROBNP in the last 8760 hours. HbA1C: No results for input(s): HGBA1C in the last 72 hours. CBG: Recent Labs  Lab 01/22/21 0445  GLUCAP 109*   Lipid Profile: No results for input(s): CHOL, HDL, LDLCALC, TRIG, CHOLHDL, LDLDIRECT in the last 72 hours. Thyroid Function Tests: No results for input(s): TSH, T4TOTAL, FREET4, T3FREE, THYROIDAB in the last 72 hours. Anemia Panel: No results for input(s): VITAMINB12, FOLATE, FERRITIN, TIBC, IRON, RETICCTPCT in the last 72 hours. Sepsis Labs: No results for input(s): PROCALCITON, LATICACIDVEN in the last 168  hours.  Recent Results (from the past 240 hour(s))  Resp Panel by RT-PCR (Flu A&B, Covid) Nasopharyngeal Swab     Status: None   Collection Time: 01/19/21  4:39 AM   Specimen: Nasopharyngeal Swab; Nasopharyngeal(NP) swabs in vial transport medium  Result Value Ref Range Status   SARS Coronavirus 2 by RT PCR NEGATIVE NEGATIVE Final    Comment: (NOTE) SARS-CoV-2 target nucleic acids are NOT DETECTED.  The SARS-CoV-2 RNA is generally detectable in upper respiratory specimens during the acute phase of infection. The lowest concentration of SARS-CoV-2 viral copies this assay can detect is 138 copies/mL. A negative result does not preclude SARS-Cov-2 infection and should not be used as the sole basis for treatment or other patient management decisions. A negative result may occur with  improper specimen collection/handling, submission of specimen other than nasopharyngeal swab, presence of viral mutation(s) within the areas targeted by this assay, and inadequate number of viral copies(<138 copies/mL). A negative result must be combined with clinical observations, patient history, and epidemiological information. The expected result is Negative.  Fact Sheet for Patients:  EntrepreneurPulse.com.au  Fact Sheet for Healthcare Providers:  IncredibleEmployment.be  This test is no t yet approved or cleared by the [place] FDA and  has been authorized for detection and/or diagnosis of SARS-CoV-2 by FDA under an Emergency Use Authorization (EUA). This EUA will remain  in effect (meaning this test can be used) for the duration of the COVID-19 declaration under Section 564(b)(1) of the Act, 21 U.S.C.section 360bbb-3(b)(1), unless the authorization is terminated  or revoked sooner.       Influenza A by PCR NEGATIVE NEGATIVE Final   Influenza B by PCR NEGATIVE NEGATIVE Final    Comment: (NOTE) The Xpert Xpress SARS-CoV-2/FLU/RSV plus assay is intended  as an aid in the diagnosis of influenza from Nasopharyngeal swab specimens and should not be used as a sole basis for treatment. Nasal washings and aspirates are unacceptable for Xpert Xpress SARS-CoV-2/FLU/RSV testing.  Fact Sheet for Patients: EntrepreneurPulse.com.au  Fact Sheet for Healthcare Providers: IncredibleEmployment.be  This test is not yet approved or cleared by the [place] FDA and has been authorized for detection and/or diagnosis of SARS-CoV-2 by FDA under an Emergency Use Authorization (EUA). This EUA will remain in effect (meaning this test can be used) for the duration of the COVID-19 declaration under Section 564(b)(1) of the Act, 21 U.S.C. section 360bbb-3(b)(1), unless the authorization is terminated or revoked.  Performed at [facility], [address] [place], [place] [address]   MRSA Next Gen by PCR, Nasal     Status: None   Collection Time: 01/22/21  4:57 AM   Specimen: Nasal Mucosa; Nasal Swab  Result Value Ref Range Status   MRSA by PCR Next Gen NOT DETECTED NOT DETECTED Final    Comment: (NOTE) The GeneXpert MRSA Assay (FDA approved for NASAL specimens only), is one component of a comprehensive MRSA colonization surveillance program. It is not intended to diagnose MRSA infection nor to guide or monitor treatment for MRSA infections. Test performance is not FDA approved in patients less than 69 years old. Performed at [facility], [address] [place], [place] [address]          Radiology Studies: No results found.      Scheduled Meds:  [MAR Hold] acetaminophen  650 mg Oral Once   [MAR Hold] allopurinol  100 mg Oral Daily   [MAR Hold] carvedilol  6.25 mg Oral BID WC   [MAR Hold] diazepam  5 mg Intravenous E9H   [MAR Hold] folic acid  1 mg Oral Daily   [MAR Hold] furosemide  40 mg Oral Daily   [MAR Hold] midodrine  10 mg Oral TID WC   [MAR Hold] multivitamin with  minerals  1 tablet Oral Daily   [MAR Hold] nicotine  21 mg Transdermal Daily   [MAR Hold] pantoprazole  40 mg Intravenous Q12H   [MAR Hold] senna-docusate  2 tablet Oral BID   [MAR Hold] sodium chloride  1 g Oral Q0600   [MAR Hold] thiamine  100 mg Oral Daily   Or   [MAR Hold] thiamine  100 mg Intravenous Daily   Continuous Infusions:  sodium chloride 10 mL/hr at 01/26/21 1224   sodium chloride 20 mL/hr at 01/26/21 1149     LOS: 6 days    Time spent: 28 minutes    Sharen Hones, MD [facility]   To contact the attending provider between 7A-7P or the covering provider during after hours 7P-7A, please log into the web site  [email or web address] and access using universal [place] password for that web site. If you do not have the password, please call the hospital operator.  01/26/2021, 1:20 PM

## 2021-01-26 NOTE — Anesthesia Preprocedure Evaluation (Signed)
Anesthesia Evaluation  Patient identified by MRN, date of birth, ID band Patient awake    Reviewed: Allergy & Precautions, NPO status , Patient's Chart, lab work & pertinent test results  History of Anesthesia Complications Negative for: history of anesthetic complications  Airway Mallampati: III  TM Distance: >3 FB Neck ROM: Full    Dental  (+) Poor Dentition   Pulmonary neg sleep apnea, neg COPD, Current Smoker and Patient abstained from smoking.,    breath sounds clear to auscultation- rhonchi (-) wheezing      Cardiovascular hypertension, Pt. on medications (-) CAD, (-) Past MI, (-) Cardiac Stents and (-) CABG  Rhythm:Regular Rate:Normal - Systolic murmurs and - Diastolic murmurs    Neuro/Psych neg Seizures PSYCHIATRIC DISORDERS (EtOH abuse, DT during hospitalization) S/p fall with small subdural hematoma    GI/Hepatic negative GI ROS, (+) Hepatitis - (alcoholic)  Endo/Other  negative endocrine ROSneg diabetes  Renal/GU negative Renal ROS     Musculoskeletal  (+) Arthritis ,   Abdominal (+) - obese,   Peds  Hematology  (+) anemia ,   Anesthesia Other Findings Past Medical History: No date: Alcohol dependence (HCC) No date: Arthritis No date: Cataract No date: Chronic systolic (congestive) heart failure (HCC)     Comment:  EF 25-30% in 10/2019 No date: Dyslipidemia No date: Essential hypertension No date: History of kidney stones No date: Hyperlipidemia No date: Hypertension No date: Pneumonia No date: Prosthetic shoulder infection, initial encounter ([place])   Reproductive/Obstetrics                             Anesthesia Physical Anesthesia Plan  ASA: 3  Anesthesia Plan: General   Post-op Pain Management:    Induction: Intravenous  PONV Risk Score and Plan: 0 and Propofol infusion  Airway Management Planned: Natural Airway  Additional Equipment:   Intra-op Plan:    Post-operative Plan:   Informed Consent: I have reviewed the patients History and Physical, chart, labs and discussed the procedure including the risks, benefits and alternatives for the proposed anesthesia with the patient or authorized representative who has indicated his/her understanding and acceptance.     Dental advisory given  Plan Discussed with: CRNA and Anesthesiologist  Anesthesia Plan Comments: (Pt has clearance by neurosurgery (Dr. Lacinda Axon) for procedure today)        Anesthesia Quick Evaluation

## 2021-01-26 NOTE — Progress Notes (Signed)
Post endoscopy note- Please see epic procedure note for full report. Continue twice daily po ppi as outpatient Patient unable to drink prep- recommend outpatient  colonoscopy with primary GI- Dr. Henrene Pastor Avoid nsaids Recommend alcohol cessation Stool is brown  GI to sign off. Available as needed. Please do not hesitate to call regarding questions or concerns.  Fernando Helling, DO [facility]enter[facility]

## 2021-01-26 NOTE — Progress Notes (Signed)
[facility] Gastroenterology Inpatient Follow-up/Progress Note   Patient ID: Fernando [name] is a 65 y.o. male.  Overnight Events / Subjective Findings Patient did not complete prep. Stool is brown. Hgb stable at 8.1, grossly macrocytic. Will plan for egd today. Patient can follow up outpatient with primary GI for colonoscopy.  No other acute gi complaints.  Review of Systems  Constitutional:  Negative for activity change, appetite change, chills, fatigue, fever and unexpected weight change.  HENT:  Negative for trouble swallowing and voice change.   Respiratory:  Negative for shortness of breath.   Cardiovascular:  Negative for chest pain and palpitations.  Gastrointestinal:  Negative for abdominal distention, abdominal pain, anal bleeding, blood in stool, constipation, diarrhea, nausea and vomiting.  Musculoskeletal:  Negative for arthralgias and myalgias.  Skin:  Negative for color change and pallor.  Neurological:  Negative for dizziness, syncope and weakness.  All other systems reviewed and are negative.   Medications  Current Facility-Administered Medications:    0.9 %  sodium chloride infusion, 250 mL, Intravenous, Continuous, Graves, Raeford Razor, NP, Last Rate: 10 mL/hr at 01/24/21 1206, 250 mL at 01/24/21 1206   0.9 %  sodium chloride infusion, , Intravenous, Continuous, Jaeson, [place], DO   acetaminophen (TYLENOL) tablet 650 mg, 650 mg, Oral, Q6H PRN **OR** acetaminophen (TYLENOL) suppository 650 mg, 650 mg, Rectal, Q6H PRN, Athena Masse, MD   acetaminophen (TYLENOL) tablet 650 mg, 650 mg, Oral, Once, Loletha Grayer, MD   allopurinol (ZYLOPRIM) tablet 100 mg, 100 mg, Oral, Daily, Leslye Peer, Richard, MD, 100 mg at 01/25/21 0949   budesonide (PULMICORT) nebulizer solution 0.5 mg, 0.5 mg, Nebulization, BID PRN, Sharen Hones, MD   carvedilol (COREG) tablet 6.25 mg, 6.25 mg, Oral, BID WC, Wieting, Richard, MD, 6.25 mg at 01/25/21 1840   diazepam (VALIUM) injection 5 mg, 5 mg,  Intravenous, Q6H, Kasa, Kurian, MD, 5 mg at 70/01/74 9449   folic acid (FOLVITE) tablet 1 mg, 1 mg, Oral, Daily, Judd Gaudier V, MD, 1 mg at 01/25/21 0949   furosemide (LASIX) tablet 40 mg, 40 mg, Oral, Daily, Sharen Hones, MD, 40 mg at 01/25/21 0950   HYDROcodone-acetaminophen (NORCO/VICODIN) 5-325 MG per tablet 1-2 tablet, 1-2 tablet, Oral, Q4H PRN, Dorothe Pea, RPH, 2 tablet at 01/24/21 1848   ipratropium-albuterol (DUONEB) 0.5-2.5 (3) MG/3ML nebulizer solution 3 mL, 3 mL, Nebulization, Q6H PRN, Sharen Hones, MD   midodrine (PROAMATINE) tablet 10 mg, 10 mg, Oral, TID WC, Darel Hong D, NP, 10 mg at 01/25/21 1841   multivitamin with minerals tablet 1 tablet, 1 tablet, Oral, Daily, Athena Masse, MD, 1 tablet at 01/25/21 0948   nicotine (NICODERM CQ - dosed in mg/24 hours) patch 21 mg, 21 mg, Transdermal, Daily, Judd Gaudier V, MD, 21 mg at 01/26/21 0514   ondansetron (ZOFRAN) tablet 4 mg, 4 mg, Oral, Q6H PRN **OR** ondansetron (ZOFRAN) injection 4 mg, 4 mg, Intravenous, Q6H PRN, Athena Masse, MD   pantoprazole (PROTONIX) injection 40 mg, 40 mg, Intravenous, Q12H, Wieting, Richard, MD, 40 mg at 01/26/21 0256   polyethylene glycol-electrolytes (NuLYTELY) solution 2,000 mL, 2,000 mL, Oral, Once PRN, [name], DO   potassium chloride 10 mEq in 100 mL IVPB, 10 mEq, Intravenous, Q1 Hr x 4, Zhang, Dekui, MD, Last Rate: 100 mL/hr at 01/26/21 1047, 10 mEq at 01/26/21 1047   senna-docusate (Senokot-S) tablet 2 tablet, 2 tablet, Oral, BID, Sharen Hones, MD, 2 tablet at 01/25/21 2100   sodium chloride tablet 1 g, 1  g, Oral, Q0600, Sharen Hones, MD, 1 g at 01/25/21 0510   thiamine tablet 100 mg, 100 mg, Oral, Daily, 100 mg at 01/25/21 0949 **OR** thiamine (B-1) injection 100 mg, 100 mg, Intravenous, Daily, Hinda Kehr, MD, 100 mg at 01/26/21 0945  sodium chloride 250 mL (01/24/21 1206)   sodium chloride     potassium chloride 10 mEq (01/26/21 1047)    acetaminophen **OR**  acetaminophen, budesonide (PULMICORT) nebulizer solution, HYDROcodone-acetaminophen, ipratropium-albuterol, ondansetron **OR** ondansetron (ZOFRAN) IV, polyethylene glycol-electrolytes   Objective    Vitals:   01/25/21 1928 01/26/21 0312 01/26/21 0710 01/26/21 1100  BP: (!) 104/55 (!) 104/57 102/63 102/74  Pulse: 76 80 92 84  Resp: 20 20 20    Temp: 98.3 F (36.8 C) 97.9 F (36.6 C) 99.5 F (37.5 C)   TempSrc: Oral Oral Oral   SpO2: 97% 91% 96%   Weight:      Height:         Physical Exam Vitals and nursing note reviewed.  Constitutional:      General: He is not in acute distress.    Appearance: He is normal weight. He is not toxic-appearing or diaphoretic.  HENT:     Head: Normocephalic.     Comments: Still with repaired laceration on forehead    Nose: Nose normal.     Mouth/Throat:     Mouth: Mucous membranes are moist.     Pharynx: Oropharynx is clear.  Eyes:     General: No scleral icterus.    Extraocular Movements: Extraocular movements intact.  Cardiovascular:     Rate and Rhythm: Normal rate and regular rhythm.     Heart sounds: Normal heart sounds. No murmur heard.   No friction rub. No gallop.  Pulmonary:     Effort: Pulmonary effort is normal. No respiratory distress.     Breath sounds: Wheezing (bilateral) present. No rhonchi or rales.  Abdominal:     General: Abdomen is flat. Bowel sounds are normal. There is no distension.     Palpations: Abdomen is soft.     Tenderness: There is no abdominal tenderness. There is no guarding or rebound.  Musculoskeletal:     Cervical back: Neck supple.     Right lower leg: No edema.     Left lower leg: No edema.  Skin:    General: Skin is warm and dry.     Coloration: Skin is not jaundiced or pale.  Neurological:     Mental Status: He is alert and oriented to person, place, and time.     Comments: Answers questions appropriately  Psychiatric:        Thought Content: Thought content normal.        Judgment:  Judgment normal.     Laboratory Data Recent Labs  Lab 01/24/21 0413 01/25/21 0531 01/26/21 0404  WBC 3.2* 3.1* 3.4*  HGB 7.1* 7.9* 8.1*  HCT 20.0* 22.5* 23.8*  PLT 94* 123* 142*  NEUTOPHILPCT 50 45 52  LYMPHOPCT 23 29 21   MONOPCT 25 24 25   EOSPCT 1 1 1     Recent Labs  Lab 01/24/21 0413 01/25/21 0531 01/26/21 0404  NA 126* 128* 129*  K 3.8 3.1* 3.2*  CL 97* 95* 95*  CO2 24 26 26   BUN 10 13 9   CREATININE 0.59* 0.72 0.74  CALCIUM 7.8* 7.9* 8.1*  GLUCOSE 95 97 92    Recent Labs  Lab 01/20/21 0453  INR 1.1       Imaging Studies: No results found.  Assessment:   # Melena - egd last month with gastric erosions, esophagitis and gastropathy - given his thrombocytopenia, these areas were likely oozing leading to dark stools and anemia. - His cell line production also suppressed 2/2 etoh use - rcd 1 u prbc 10/22 - hemodynamically stable - similar findings on EGD in May of this year and October of last year - BUN/Cr ratio 12.5 - h pylori negaive on previous egd   # anemia- macrocytic- pancytopenia - rcd 1 u prbc 10/22 - hgb 8.1 today - baseline appears to be around 9 - 2/2 to etoh bone marrow suppression; - not iron deficient; sat 55%   # subdural hematoma- 86mm on CT - 2/2 syncopal episode with head strike- with laceration   # thrombocytopenia- improving - s/p transfusions - now up to 123K - 2/2 etoh use; no imaging findings of cirrhosis on multiple scans- CT and [place]   # EtOH abuse with withdrawal - level >300 on arrival - recovered from withdrawal after ICU stay  # Alcoholic hepatitis - AST 95; ALT 39 on presentation - resolving  # multiple electrolyte abnormalities             - hypo mag, hypokalemia hypo natremia  # h/o esophagitis and gastric erosions on multiple egds # h/o pancreatic cysts # tobacco dependence # HFpEF    Plan:  Npo since midnight Did not complete prep Only egd today Labs reviewed Colonoscopy as outpatient with  primary gi On protonix 40 mg iv q12h Monitor h&h with transfusion and resuscitation as per primary team D/w neurosurgery and patient ok for sedation for procedures given small size of SDH. Appreciate assistance  Further recs pending endoscopy- please see report in epic  - CIWA protocol and supportive care as per primary team - electrolyte correction as per primary team - recommend nsaid avoidance  Esophagogastroduodenoscopy with possible biopsy, control of bleeding, polypectomy, and interventions as necessary has been discussed with the patient/patient representative. Informed consent was obtained from the patient/patient representative after explaining the indication, nature, and risks of the procedure including but not limited to death, bleeding, perforation, missed neoplasm/lesions, cardiorespiratory compromise, and reaction to medications. Opportunity for questions was given and appropriate answers were provided. Patient/patient representative has verbalized understanding is amenable to undergoing the procedure.  I personally performed the service.  Management of other medical comorbidities as per primary team  Thank you for allowing [place] to participate in this patient's care. Please don't hesitate to call if any questions or concerns arise.   [name], DO [facility] Gastroenterology  Portions of the record may have been created with voice recognition software. Occasional wrong-word or 'sound-a-like' substitutions may have occurred due to the inherent limitations of voice recognition software.  Read the chart carefully and recognize, using context, where substitutions may have occurred.

## 2021-01-26 NOTE — Transfer of Care (Signed)
Immediate Anesthesia Transfer of Care Note  Patient: Fernando [name]  Procedure(s) Performed: ESOPHAGOGASTRODUODENOSCOPY (EGD) WITH PROPOFOL  Patient Location: Endoscopy Unit  Anesthesia Type:General  Level of Consciousness: awake, alert  and oriented  Airway & Oxygen Therapy: Patient Spontanous Breathing and Patient connected to nasal cannula oxygen  Post-op Assessment: Report given to RN and Post -op Vital signs reviewed and stable  Post vital signs: Reviewed and stable  Last Vitals:  Vitals Value Taken Time  BP 117/68 01/26/21 1245  Temp 36.8 C 01/26/21 1240  Pulse 83 01/26/21 1246  Resp 18 01/26/21 1246  SpO2 98 % 01/26/21 1246  Vitals shown include unvalidated device data.  Last Pain:  Vitals:   01/26/21 1240  TempSrc: Temporal  PainSc: Asleep      Patients Stated Pain Goal: 0 (69/86/14 8307)  Complications: No notable events documented.

## 2021-01-26 NOTE — Anesthesia Postprocedure Evaluation (Signed)
Anesthesia Post Note  Patient: Fernando [name]  Procedure(s) Performed: ESOPHAGOGASTRODUODENOSCOPY (EGD) WITH PROPOFOL  Patient location during evaluation: Endoscopy Anesthesia Type: General Level of consciousness: awake and alert and oriented Pain management: pain level controlled Vital Signs Assessment: post-procedure vital signs reviewed and stable Respiratory status: spontaneous breathing, nonlabored ventilation and respiratory function stable Cardiovascular status: blood pressure returned to baseline and stable Postop Assessment: no signs of nausea or vomiting Anesthetic complications: no   No notable events documented.   Last Vitals:  Vitals:   01/26/21 1250 01/26/21 1300  BP: 117/68 (!) 121/57  Pulse:    Resp:    Temp:    SpO2:      Last Pain:  Vitals:   01/26/21 1300  TempSrc:   PainSc: 0-No pain                 Sherral Dirocco

## 2021-01-26 NOTE — TOC Progression Note (Signed)
Transition of Care [facility]) - Progression Note    Patient Details  Name: Fernando [name] MRN: [id number] Date of Birth: 1956/03/30  Transition of Care [facility]) CM/SW Contact  Beverly Sessions, RN Phone Number: 01/26/2021, 9:38 AM  Clinical Narrative:     Per chart review plan for egd/coloscopy today VM left for son to review bed offers   Expected Discharge Plan: [place] (SNF recommended, patient declined) Barriers to Discharge: Continued Medical Work up  Expected Discharge Plan and Services Expected Discharge Plan: [place] (SNF recommended, patient declined)   Discharge Planning Services: CM Consult Post Acute Care Choice: [place] arrangements for the past 2 months: Apartment                                       Social Determinants of Health (SDOH) Interventions    Readmission Risk Interventions Readmission Risk Prevention Plan 01/23/2021 12/19/2020  Transportation Screening Complete Complete  PCP or Specialist Appt within 3-5 Days - Complete  Social Work Consult for [place] Planning/Counseling - Complete  Palliative Care Screening - Not Applicable  Medication Review Press photographer) Complete Complete  PCP or Specialist appointment within 3-5 days of discharge Not Complete -  PCP/Specialist Appt Not Complete comments NMS at this time. -  [place] or Home Care Consult Complete -  SW Recovery Care/Counseling Consult Complete -  Some recent data might be hidden

## 2021-01-26 NOTE — TOC Progression Note (Signed)
Transition of Care [facility]) - Progression Note    Patient Details  Name: ADNAN VANVOORHIS MRN: [id number] Date of Birth: 09-Mar-1956  Transition of Care [facility]) CM/SW Contact  Beverly Sessions, RN Phone Number: 01/26/2021, 1:15 PM  Clinical Narrative:     Met with patient at bedside.  After further discussion patient is agreeable to SNF.  He defers to son to make decision on facility.  Patient states that he has not had any covid vaccines   Expected Discharge Plan: [place] (SNF recommended, patient declined) Barriers to Discharge: Continued Medical Work up  Expected Discharge Plan and Services Expected Discharge Plan: [place] (SNF recommended, patient declined)   Discharge Planning Services: CM Consult Post Acute Care Choice: [place] arrangements for the past 2 months: Apartment                                       Social Determinants of Health (SDOH) Interventions    Readmission Risk Interventions Readmission Risk Prevention Plan 01/23/2021 12/19/2020  Transportation Screening Complete Complete  PCP or Specialist Appt within 3-5 Days - Complete  Social Work Consult for [place] Planning/Counseling - Complete  Palliative Care Screening - Not Applicable  Medication Review Press photographer) Complete Complete  PCP or Specialist appointment within 3-5 days of discharge Not Complete -  PCP/Specialist Appt Not Complete comments NMS at this time. -  [place] or Home Care Consult Complete -  SW Recovery Care/Counseling Consult Complete -  Some recent data might be hidden

## 2021-01-26 NOTE — Care Management Important Message (Signed)
Important Message  Patient Details  Name: Fernando [name] MRN: [id number] Date of Birth: 1955-08-08   Medicare Important Message Given:  Yes     Dannette Barbara 01/26/2021, 2:30 PM

## 2021-01-27 ENCOUNTER — Ambulatory Visit: Payer: Medicare Other | Admitting: Internal Medicine

## 2021-01-27 ENCOUNTER — Encounter: Payer: Self-pay | Admitting: Gastroenterology

## 2021-01-27 DIAGNOSIS — U071 COVID-19: Secondary | ICD-10-CM | POA: Diagnosis not present

## 2021-01-27 DIAGNOSIS — S065XAA Traumatic subdural hemorrhage with loss of consciousness status unknown, initial encounter: Secondary | ICD-10-CM | POA: Diagnosis not present

## 2021-01-27 DIAGNOSIS — I5032 Chronic diastolic (congestive) heart failure: Secondary | ICD-10-CM | POA: Diagnosis not present

## 2021-01-27 LAB — CBC WITH DIFFERENTIAL/PLATELET
Abs Immature Granulocytes: 0.05 10*3/uL (ref 0.00–0.07)
Basophils Absolute: 0 10*3/uL (ref 0.0–0.1)
Basophils Relative: 0 %
Eosinophils Absolute: 0 10*3/uL (ref 0.0–0.5)
Eosinophils Relative: 0 %
HCT: 22.9 % — ABNORMAL LOW (ref 39.0–52.0)
Hemoglobin: 7.9 g/dL — ABNORMAL LOW (ref 13.0–17.0)
Immature Granulocytes: 1 %
Lymphocytes Relative: 17 %
Lymphs Abs: 0.9 10*3/uL (ref 0.7–4.0)
MCH: 39.1 pg — ABNORMAL HIGH (ref 26.0–34.0)
MCHC: 34.5 g/dL (ref 30.0–36.0)
MCV: 113.4 fL — ABNORMAL HIGH (ref 80.0–100.0)
Monocytes Absolute: 0.8 10*3/uL (ref 0.1–1.0)
Monocytes Relative: 14 %
Neutro Abs: 3.7 10*3/uL (ref 1.7–7.7)
Neutrophils Relative %: 68 %
Platelets: 182 10*3/uL (ref 150–400)
RBC: 2.02 MIL/uL — ABNORMAL LOW (ref 4.22–5.81)
RDW: 19.5 % — ABNORMAL HIGH (ref 11.5–15.5)
Smear Review: NORMAL
WBC: 5.4 10*3/uL (ref 4.0–10.5)
nRBC: 0 % (ref 0.0–0.2)

## 2021-01-27 LAB — BASIC METABOLIC PANEL
Anion gap: 7 (ref 5–15)
BUN: 12 mg/dL (ref 8–23)
CO2: 26 mmol/L (ref 22–32)
Calcium: 8.2 mg/dL — ABNORMAL LOW (ref 8.9–10.3)
Chloride: 96 mmol/L — ABNORMAL LOW (ref 98–111)
Creatinine, Ser: 0.74 mg/dL (ref 0.61–1.24)
GFR, Estimated: >60 mL/min (ref >60)
Glucose, Bld: 106 mg/dL — ABNORMAL HIGH (ref 70–99)
Potassium: 3.7 mmol/L (ref 3.5–5.1)
Sodium: 129 mmol/L — ABNORMAL LOW (ref 135–145)

## 2021-01-27 LAB — MAGNESIUM: Magnesium: 1.9 mg/dL (ref 1.7–2.4)

## 2021-01-27 LAB — RESP PANEL BY RT-PCR (FLU A&B, COVID) ARPGX2
Influenza A by PCR: NEGATIVE
Influenza B by PCR: NEGATIVE
SARS Coronavirus 2 by RT PCR: POSITIVE — AB

## 2021-01-27 LAB — PHOSPHORUS: Phosphorus: 2.3 mg/dL — ABNORMAL LOW (ref 2.5–4.6)

## 2021-01-27 MED ORDER — PANTOPRAZOLE SODIUM 40 MG PO TBEC
40.0000 mg | DELAYED_RELEASE_TABLET | Freq: Two times a day (BID) | ORAL | Status: DC
  Administered 2021-01-27 – 2021-02-02 (×10): 40 mg via ORAL
  Filled 2021-01-27 (×13): qty 1

## 2021-01-27 MED ORDER — HALOPERIDOL LACTATE 5 MG/ML IJ SOLN
2.0000 mg | Freq: Once | INTRAMUSCULAR | Status: AC
  Administered 2021-01-27: 2 mg via INTRAMUSCULAR
  Filled 2021-01-27: qty 1

## 2021-01-27 MED ORDER — IPRATROPIUM-ALBUTEROL 0.5-2.5 (3) MG/3ML IN SOLN: 3.0000 mL | Freq: Four times a day (QID) | RESPIRATORY_TRACT | Status: AC | PRN

## 2021-01-27 MED ORDER — SODIUM CHLORIDE 1 G PO TABS: 1.0000 g | ORAL_TABLET | Freq: Every day | ORAL | 0 refills | Status: DC

## 2021-01-27 MED ORDER — MIDODRINE HCL 10 MG PO TABS: 5.0000 mg | ORAL_TABLET | Freq: Three times a day (TID) | ORAL | 0 refills | Status: AC

## 2021-01-27 NOTE — Progress Notes (Signed)
Patient was scheduled to transfer to nursing home today.  COVID test came back positive.  Will need to quarantine for 10 days.  Cancel discharge.

## 2021-01-27 NOTE — Discharge Summary (Addendum)
Physician Discharge Summary  Patient ID: Fernando [name] MRN: [id number] DOB/AGE: [birth year] 65 y.o.  Admit date: 01/19/2021 Discharge date: 02/02/2021  Admission Diagnoses:  Discharge Diagnoses:  Principal Problem:   Acute subdural hematoma Active Problems:   Delirium tremens ([place])   Fall   Essential hypertension   Alcohol use disorder, moderate, dependence ([place])   Alcoholic liver disease (HCC)   Chronic diastolic CHF (congestive heart failure) (HCC)   Hypokalemia   Hypomagnesemia   Laceration of forehead   Hematoma of frontal scalp   History of GI bleed   Subdural hematoma   COVID-19 virus infection   Scalp laceration   Iron deficiency anemia due to chronic blood loss   Acute blood loss anemia   Acute urinary retention  Please repeat CBC and BMP within 1 week.   Discharged Condition: good  Hospital Course:  65 year old man with chronic systolic congestive heart failure, essential hypertension, hyperlipidemia, and alcohol abuse.  He presented after a fall and was able to crawl to his phone to call 911.  He was found to have a large laceration on his forehead which was sutured in the emergency room and a subdural hematoma.  Sutures will have to be removed in about 6 days.  Later developed multiple black bowel movements.  Had  platelet transfusion on 01/20/2021.  Patient also has severe hypomagnesemia and hypokalemia on presentation and electrolytes were replaced. Patient developed severe agitation with alcohol withdrawal early morning of 10/20, was placed on Precedex, he also required pressor support and transferred to ICU.  Condition improved again 10/21.  Pancytopenia Anemia. Thrombocytopenia. Melena secondary to gastritis. EGD showed gastritis.  No active bleeding.  Continue PPI for now. Patient does not have iron and B12 deficiency. Patient will be followed by Dr. Virgina Jock in 2 weeks. Pancytopenia seem to be improving.  Alcohol use disorder. Alcohol withdrawal with  delirium tremens. Hypotension secondary to dehydration. Alcoholic hepatitis  Condition had essentially resolved.  Head concussion with a forehead laceration. Subdural hematoma. Ground-level fall. Conditions are stable.  Suture removed.   Hypokalemia. Hypomagnesemia Hyponatremia secondary to SIADH. Sodium level close to baseline   Chronic diastolic congestive heart failure. COPD  Stable Incidental COVID + on 10/25 - follow isolation protocol per facility  Acute Urinary Retention Gross Hematuria/Non-draining Foley - Seen by Urology and Foley was replaced with 20 FR Coude foley without any complications. Hematuria resolved with manual irrigation -Continue Foley catheter at DC, manually irrigate as needed if it stops draining -Continue Flomax, discontinue if patient develops orthostasis -Outpatient voiding trial in our clinic in 1-2 weeks  Consults: GI, ICU, Urology  Significant Diagnostic Studies:   Treatments: PPI, CIWA  Discharge Exam: Blood pressure 109/64, pulse 96, temperature 99.5 F (37.5 C), temperature source Oral, resp. rate 20, height 5\' 5"  (1.651 m), weight 68 kg, SpO2 91 %. General appearance: alert and cooperative Resp: clear to auscultation bilaterally Cardio: regular rate and rhythm, S1, S2 normal, no murmur, click, rub or gallop GI: soft, non-tender; bowel sounds normal; no masses,  no organomegaly Extremities: extremities normal, atraumatic, no cyanosis or edema GU - indwelling foley  Disposition: Discharge disposition: 03-Skilled Nursing Facility       Discharge Instructions     MyChart COVID-19 home monitoring program   Complete by: Jan 28, 2021    Is the patient willing to use the [employer] for home monitoring?: Yes   Temperature monitoring   Complete by: Jan 28, 2021    After how many days would  you like to receive a notification of this patient's flowsheet entries?: 1   Diet - low sodium heart healthy   Complete by: As directed     Fluids restriction 153ml/day   Discharge wound care:   Complete by: As directed    Follow with RN.   Increase activity slowly   Complete by: As directed    Increase activity slowly   Complete by: As directed    No wound care   Complete by: As directed       Allergies as of 02/02/2021       Reactions   Atorvastatin    Other reaction(s): joints ache   Doxycycline    Other reaction(s): rash/hive        Medication List     STOP taking these medications    carvedilol 3.125 MG tablet Commonly known as: COREG   docusate sodium 100 MG capsule Commonly known as: COLACE   folic acid 1 MG tablet Commonly known as: FOLVITE   furosemide 40 MG tablet Commonly known as: Lasix   losartan 25 MG tablet Commonly known as: Cozaar   multivitamin tablet   omeprazole 40 MG capsule Commonly known as: PRILOSEC   pantoprazole 40 MG tablet Commonly known as: PROTONIX   thiamine 100 MG tablet       TAKE these medications    allopurinol 100 MG tablet Commonly known as: ZYLOPRIM Take 100 mg by mouth daily.   ipratropium-albuterol 0.5-2.5 (3) MG/3ML Soln Commonly known as: DUONEB Take 3 mLs by nebulization every 6 (six) hours as needed.   tamsulosin 0.4 MG Caps capsule Commonly known as: FLOMAX Take 1 capsule (0.4 mg total) by mouth daily.       ASK your doctor about these medications    midodrine 10 MG tablet Commonly known as: PROAMATINE Take 0.5 tablets (5 mg total) by mouth 3 (three) times daily with meals for 3 days. Ask about: Should I take this medication?               Discharge Care Instructions  (From admission, onward)           Start     Ordered   01/27/21 0000  Discharge wound care:       Comments: Follow with RN.   01/27/21 1227            Contact information for follow-up providers     Avva, Ravisankar, MD Follow up in 1 week(s).   Specialty: Internal Medicine Contact information: [place]  [address] [phone]         Buford Dresser, MD .   Specialty: Cardiology Contact information: [address] [place] [address] [phone]         [name], DO Follow up in 2 week(s).   Contact information: [facility] [place] [address] [phone]         Debroah Loop, PA-C. Schedule an appointment as soon as possible for a visit in 1 week(s).   Specialty: Urology Why: [facility] Discharge F/UP for voiding trial and foley removal Contact information: [place] [address] [phone]              Contact information for after-discharge care     [facility] [facility] Preferred SNF .   Service: Skilled Chiropodist information: [address] [place] [phone]  35 minutes Signed: Max Sane 02/02/2021, 7:31 AM

## 2021-01-27 NOTE — Progress Notes (Signed)
VAST consult received to obtain IV access for patient's valium. Assessed patient's arms bilaterally which are extremely bruised and dry. Attempted X1 to start PIV, but was unsuccessful. Informed unit RN and advised if patient NEEDS an IV at any time due to deteriorating status or need for IV medication/fluids, IV team consult can be placed and we will find a way to gain access, even if it means placing an IO. Reached out to physician via [place] and  requested valium route be changed to PO so that patient's arms can heal and we can reduce his risk for infection. Roosevelt Locks, MD verbalized he dc'd all IV meds and changed route to PO. Unit RN notified.

## 2021-01-27 NOTE — TOC Progression Note (Addendum)
Transition of Care [facility]) - Progression Note    Patient Details  Name: WILBORN MEMBRENO MRN: [id number] Date of Birth: 26-Jan-1956  Transition of Care [facility]) CM/SW Contact  Beverly Sessions, RN Phone Number: 01/27/2021, 9:04 AM  Clinical Narrative:     Spoke with son [name] via phone. Presented bed offers.  He is to call TOC back by 12pm with his decision  Update:  Son accepted bed at [facility] [facility]. Patient was ready for discharge however covid results came back positive. Helene Kelp at [place] to let me know what their quarantine period is   Expected Discharge Plan: [place] (SNF recommended, patient declined) Barriers to Discharge: Continued Medical Work up  Expected Discharge Plan and Services Expected Discharge Plan: [place] (SNF recommended, patient declined)   Discharge Planning Services: CM Consult Post Acute Care Choice: [place] arrangements for the past 2 months: Apartment                                       Social Determinants of Health (SDOH) Interventions    Readmission Risk Interventions Readmission Risk Prevention Plan 01/23/2021 12/19/2020  Transportation Screening Complete Complete  PCP or Specialist Appt within 3-5 Days - Complete  Social Work Consult for [place] Planning/Counseling - Complete  Palliative Care Screening - Not Applicable  Medication Review Press photographer) Complete Complete  PCP or Specialist appointment within 3-5 days of discharge Not Complete -  PCP/Specialist Appt Not Complete comments NMS at this time. -  [place] or Home Care Consult Complete -  SW Recovery Care/Counseling Consult Complete -  Some recent data might be hidden

## 2021-01-27 NOTE — Progress Notes (Signed)
Consult for PIV start. No IV meds noted. [place] RN states MD has now ordered a PIV started. PIV started to [place]. Patient with bilateral ecchymotic arms. Poor venous choices for PIV as noted by previous IV RN. [place] RN notified that should IV come out, a different intervention may need to be attempted for med administration until  arms recover.

## 2021-01-28 DIAGNOSIS — K709 Alcoholic liver disease, unspecified: Secondary | ICD-10-CM

## 2021-01-28 DIAGNOSIS — F1020 Alcohol dependence, uncomplicated: Secondary | ICD-10-CM

## 2021-01-28 DIAGNOSIS — S0101XA Laceration without foreign body of scalp, initial encounter: Secondary | ICD-10-CM | POA: Diagnosis present

## 2021-01-28 LAB — CBC WITH DIFFERENTIAL/PLATELET
Abs Immature Granulocytes: 0.08 10*3/uL — ABNORMAL HIGH (ref 0.00–0.07)
Basophils Absolute: 0 10*3/uL (ref 0.0–0.1)
Basophils Relative: 0 %
Eosinophils Absolute: 0 10*3/uL (ref 0.0–0.5)
Eosinophils Relative: 0 %
HCT: 23 % — ABNORMAL LOW (ref 39.0–52.0)
Hemoglobin: 7.9 g/dL — ABNORMAL LOW (ref 13.0–17.0)
Immature Granulocytes: 2 %
Lymphocytes Relative: 27 %
Lymphs Abs: 1.2 10*3/uL (ref 0.7–4.0)
MCH: 38.7 pg — ABNORMAL HIGH (ref 26.0–34.0)
MCHC: 34.3 g/dL (ref 30.0–36.0)
MCV: 112.7 fL — ABNORMAL HIGH (ref 80.0–100.0)
Monocytes Absolute: 0.6 10*3/uL (ref 0.1–1.0)
Monocytes Relative: 15 %
Neutro Abs: 2.4 10*3/uL (ref 1.7–7.7)
Neutrophils Relative %: 56 %
Platelets: 223 10*3/uL (ref 150–400)
RBC: 2.04 MIL/uL — ABNORMAL LOW (ref 4.22–5.81)
RDW: 19.6 % — ABNORMAL HIGH (ref 11.5–15.5)
Smear Review: NORMAL
WBC: 4.3 10*3/uL (ref 4.0–10.5)
nRBC: 0 % (ref 0.0–0.2)

## 2021-01-28 LAB — PHOSPHORUS: Phosphorus: 3.9 mg/dL (ref 2.5–4.6)

## 2021-01-28 LAB — MAGNESIUM: Magnesium: 1.6 mg/dL — ABNORMAL LOW (ref 1.7–2.4)

## 2021-01-28 MED ORDER — MAGNESIUM SULFATE 2 GM/50ML IV SOLN
2.0000 g | Freq: Once | INTRAVENOUS | Status: AC
  Administered 2021-01-28: 2 g via INTRAVENOUS
  Filled 2021-01-28: qty 50

## 2021-01-28 NOTE — Plan of Care (Signed)

## 2021-01-28 NOTE — Hospital Course (Addendum)
65 year old man with chronic systolic congestive heart failure, essential hypertension, hyperlipidemia, and alcohol abuse.  He presented after a fall and was able to crawl to his phone to call 911.  He was found to have a large laceration on his forehead which was sutured in the emergency room and a subdural hematoma.  Sutures will have to be removed in about 6 days.  Later developed multiple black bowel movements.  Had  platelet transfusion on 01/20/2021.  Patient also has severe hypomagnesemia and hypokalemia on presentation and electrolytes were replaced. Patient developed severe agitation with alcohol withdrawal early morning of 10/20, was placed on Precedex, he also required pressor support and transferred to ICU.  Condition improved again 10/21.  10/26: Patient was scheduled to get discharge on 10/25 but COVID returned positive.  Due to SNF isolation policy discharge was canceled and he will need to stay here till 10/30.  Planned discharge on 10/31 10/27: acute urinary retention requiring foley - hematuria s/p foley and Urology c/s requested 10/28: Foley was manually irrigated by urology yesterday and hematuria has stopped. 10/29: Discontinue sitter.  Plan discharge on 10/31 10/30: Sitter discontinued for more than 24 hours now.  Plan discharge on 10/31

## 2021-01-28 NOTE — Assessment & Plan Note (Signed)
Repleted and resolved 

## 2021-01-28 NOTE — Progress Notes (Signed)
Progress Note    Fernando [name]   LKG:[id number]  DOB: 1955/06/28  DOA: 01/19/2021     8 Date of Service: 01/28/2021   Clinical Course  65 year old man with chronic systolic congestive heart failure, essential hypertension, hyperlipidemia, and alcohol abuse.  He presented after a fall and was able to crawl to his phone to call 911.  He was found to have a large laceration on his forehead which was sutured in the emergency room and a subdural hematoma.  Sutures will have to be removed in about 6 days.  Later developed multiple black bowel movements.  Had  platelet transfusion on 01/20/2021.  Patient also has severe hypomagnesemia and hypokalemia on presentation and electrolytes were replaced. Patient developed severe agitation with alcohol withdrawal early morning of 10/20, was placed on Precedex, he also required pressor support and transferred to ICU.  Condition improved again 10/21.  10/26: Patient was scheduled to get discharge on 10/25 but COVID returned positive.  Due to SNF isolation policy discharge was canceled and he will need to stay here till 10/30.  Planned discharge on 10/31       Assessment and Plan * Acute subdural hematoma Status post fall.  He had a head concussion with a forehead laceration which were sutured in the ED and removed this admission.  Scalp laceration Was sutured on admission and sutures were removed during hospitalization  COVID-19 virus infection Incidental finding, turned positive on 10/25 at planned SNF discharge which was later canceled due to SNF policy for isolation.  Patient is asymptomatic and does not need treatment.  History of GI bleed Patient had melena while in the hospital associated with pancytopenia, acute blood loss anemia and thrombocytopenia.  He underwent EGD which showed gastritis but no active bleeding.  His H&H have been stable.  Continue PPI  Hypomagnesemia Repleted and resolved  Hypokalemia Repleted and  resolved  Alcoholic liver disease ([place]) Patient is counseled for alcohol cessation  Alcohol use disorder, moderate, dependence ([place]) Patient is counseled for alcohol cessation.  He did undergo alcohol withdrawal with delirium tremens while in the hospital and was treated appropriately  Delirium tremens ([place]) Due to alcohol withdrawal.  He was appropriately treated with CIWA protocol.  This is now resolved     Subjective:  Feeling fine.  Denies any new issues.  Objective Vitals:   01/27/21 1614 01/27/21 2034 01/28/21 0501 01/28/21 0853  BP: (!) 154/85 115/74 (!) 97/59 (!) 104/56  Pulse: 88 93 77 83  Resp: 16 18 20    Temp: (!) 97.5 F (36.4 C) 98.4 F (36.9 C) 97.8 F (36.6 C) (!) 97.3 F (36.3 C)  TempSrc: Oral Oral Oral Oral  SpO2: 100% 98% 97% 99%  Weight:      Height:       68 kg  Vital signs were reviewed and unremarkable except for: Blood pressure: Low normal    Exam Physical Exam   General appearance: alert and cooperative Head: Sutured forehead laceration Resp: clear to auscultation bilaterally Cardio: regular rate and rhythm, S1, S2 normal, no murmur, click, rub or gallop GI: soft, non-tender; bowel sounds normal; no masses,  no organomegaly Extremities: extremities normal, atraumatic, no cyanosis or edema  Labs / Other Information My review of labs, imaging, notes and other tests is significant for Hemoglobin of 7.9, magnesium 1.6     Disposition Plan: Status is: Inpatient  Remains inpatient appropriate because: Anemia, hypomagnesemia, waiting for SNF isolation policy to be over on 66/44 and can be  discharged on 02/02/2021 per TOC    Time spent: 35 minutes [facility] 01/28/2021, 4:04 PM

## 2021-01-28 NOTE — Assessment & Plan Note (Signed)
Patient had melena while in the hospital associated with pancytopenia, acute blood loss anemia and thrombocytopenia.  He underwent EGD which showed gastritis but no active bleeding.  His H&H have been stable.  Continue PPI

## 2021-01-28 NOTE — Assessment & Plan Note (Signed)
Status post fall.  He had a head concussion with a forehead laceration which were sutured in the ED and removed this admission.

## 2021-01-28 NOTE — Progress Notes (Signed)
Bladder scanned patient, 657 ml, In & out cath done output of 900.

## 2021-01-28 NOTE — Assessment & Plan Note (Signed)
Patient is counseled for alcohol cessation.  He did undergo alcohol withdrawal with delirium tremens while in the hospital and was treated appropriately

## 2021-01-28 NOTE — Assessment & Plan Note (Signed)
Patient is counseled for alcohol cessation

## 2021-01-28 NOTE — Assessment & Plan Note (Signed)
Incidental finding, turned positive on 10/25 at planned SNF discharge which was later canceled due to SNF policy for isolation.  Patient is asymptomatic and does not need treatment.

## 2021-01-28 NOTE — TOC Progression Note (Addendum)
Transition of Care [facility]) - Progression Note    Patient Details  Name: Fernando [name] MRN: [id number] Date of Birth: Jul 14, 1955  Transition of Care [facility]) CM/SW [place], LCSW Phone Number: 01/28/2021, 9:53 AM  Clinical Narrative:   Received call from [place] admissions coordinator, Helene Kelp. She confirmed they are able to accept patient today. MD and son are aware.  10:00 am: Discharge paperwork sent to SNF on hub. Left message for admissions coordinator to see what time to set up transport.  10:20 am: Received call back from admissions coordinator stating she is now being told patient has to quarantine for 5 days prior to admission. Discharge date now planned for Monday 10/31. MD and son aware. Son will call patient to update him.  Expected Discharge Plan: [place] (SNF recommended, patient declined) Barriers to Discharge: Continued Medical Work up  Expected Discharge Plan and Services Expected Discharge Plan: [place] (SNF recommended, patient declined)   Discharge Planning Services: CM Consult Post Acute Care Choice: [place] arrangements for the past 2 months: Apartment Expected Discharge Date: 01/27/21                                     Social Determinants of Health (SDOH) Interventions    Readmission Risk Interventions Readmission Risk Prevention Plan 01/23/2021 12/19/2020  Transportation Screening Complete Complete  PCP or Specialist Appt within 3-5 Days - Complete  Social Work Consult for [place] Planning/Counseling - Complete  Palliative Care Screening - Not Applicable  Medication Review Press photographer) Complete Complete  PCP or Specialist appointment within 3-5 days of discharge Not Complete -  PCP/Specialist Appt Not Complete comments NMS at this time. -  [place] or Home Care Consult Complete -  SW Recovery Care/Counseling Consult Complete -  Some recent data might be hidden

## 2021-01-28 NOTE — Assessment & Plan Note (Signed)
Was sutured on admission and sutures were removed during hospitalization

## 2021-01-28 NOTE — Assessment & Plan Note (Signed)
Due to alcohol withdrawal.  He was appropriately treated with CIWA protocol.  This is now resolved

## 2021-01-29 DIAGNOSIS — R339 Retention of urine, unspecified: Secondary | ICD-10-CM

## 2021-01-29 DIAGNOSIS — D62 Acute posthemorrhagic anemia: Secondary | ICD-10-CM

## 2021-01-29 DIAGNOSIS — R338 Other retention of urine: Secondary | ICD-10-CM

## 2021-01-29 DIAGNOSIS — R310 Gross hematuria: Secondary | ICD-10-CM | POA: Diagnosis not present

## 2021-01-29 DIAGNOSIS — D500 Iron deficiency anemia secondary to blood loss (chronic): Secondary | ICD-10-CM

## 2021-01-29 LAB — CBC
HCT: 27.4 % — ABNORMAL LOW (ref 39.0–52.0)
Hemoglobin: 9.7 g/dL — ABNORMAL LOW (ref 13.0–17.0)
MCH: 37.3 pg — ABNORMAL HIGH (ref 26.0–34.0)
MCHC: 35.4 g/dL (ref 30.0–36.0)
MCV: 105.4 fL — ABNORMAL HIGH (ref 80.0–100.0)
Platelets: 255 10*3/uL (ref 150–400)
RBC: 2.6 MIL/uL — ABNORMAL LOW (ref 4.22–5.81)
RDW: 21 % — ABNORMAL HIGH (ref 11.5–15.5)
WBC: 6.4 10*3/uL (ref 4.0–10.5)
nRBC: 0 % (ref 0.0–0.2)

## 2021-01-29 LAB — CBC WITH DIFFERENTIAL/PLATELET
Abs Immature Granulocytes: 0.11 10*3/uL — ABNORMAL HIGH (ref 0.00–0.07)
Basophils Absolute: 0 10*3/uL (ref 0.0–0.1)
Basophils Relative: 0 %
Eosinophils Absolute: 0 10*3/uL (ref 0.0–0.5)
Eosinophils Relative: 0 %
HCT: 22.4 % — ABNORMAL LOW (ref 39.0–52.0)
Hemoglobin: 7.6 g/dL — ABNORMAL LOW (ref 13.0–17.0)
Immature Granulocytes: 2 %
Lymphocytes Relative: 20 %
Lymphs Abs: 1.1 10*3/uL (ref 0.7–4.0)
MCH: 37.4 pg — ABNORMAL HIGH (ref 26.0–34.0)
MCHC: 33.9 g/dL (ref 30.0–36.0)
MCV: 110.3 fL — ABNORMAL HIGH (ref 80.0–100.0)
Monocytes Absolute: 0.7 10*3/uL (ref 0.1–1.0)
Monocytes Relative: 13 %
Neutro Abs: 3.4 10*3/uL (ref 1.7–7.7)
Neutrophils Relative %: 65 %
Platelets: 246 10*3/uL (ref 150–400)
RBC: 2.03 MIL/uL — ABNORMAL LOW (ref 4.22–5.81)
RDW: 19.2 % — ABNORMAL HIGH (ref 11.5–15.5)
WBC: 5.3 10*3/uL (ref 4.0–10.5)
nRBC: 0 % (ref 0.0–0.2)

## 2021-01-29 LAB — MAGNESIUM: Magnesium: 1.8 mg/dL (ref 1.7–2.4)

## 2021-01-29 LAB — PHOSPHORUS: Phosphorus: 3 mg/dL (ref 2.5–4.6)

## 2021-01-29 LAB — PREPARE RBC (CROSSMATCH)

## 2021-01-29 MED ORDER — CHLORHEXIDINE GLUCONATE CLOTH 2 % EX PADS
6.0000 | MEDICATED_PAD | Freq: Every day | CUTANEOUS | Status: DC
  Administered 2021-01-29 – 2021-02-02 (×5): 6 via TOPICAL

## 2021-01-29 MED ORDER — HYDROCODONE-ACETAMINOPHEN 5-325 MG PO TABS
1.0000 | ORAL_TABLET | Freq: Four times a day (QID) | ORAL | Status: DC | PRN
Start: 2021-01-29 — End: 2021-01-31

## 2021-01-29 MED ORDER — TAMSULOSIN HCL 0.4 MG PO CAPS
0.4000 mg | ORAL_CAPSULE | Freq: Every day | ORAL | Status: DC
  Administered 2021-01-29 – 2021-02-02 (×5): 0.4 mg via ORAL
  Filled 2021-01-29 (×5): qty 1

## 2021-01-29 MED ORDER — SODIUM CHLORIDE 0.9% IV SOLUTION: Freq: Once | INTRAVENOUS | Status: AC

## 2021-01-29 NOTE — Assessment & Plan Note (Signed)
Due to alcohol withdrawal.  He was appropriately treated with CIWA protocol.  This is now resolved.

## 2021-01-29 NOTE — Progress Notes (Signed)
Bladder scanned patient 320 ml. Dr. Eustaquio Boyden notifiied. Ordered in and out cath PRN, and to catherize if x3 caths are needed or residual >300 ml place foley. 400 ml urine output with in and out.

## 2021-01-29 NOTE — Assessment & Plan Note (Signed)
Sutured in ED and removed later during hospital course as indicated. healing

## 2021-01-29 NOTE — Assessment & Plan Note (Signed)
S/p fall and stable

## 2021-01-29 NOTE — Assessment & Plan Note (Signed)
Patient is counseled for alcohol cessation.

## 2021-01-29 NOTE — Consult Note (Signed)
Urology Consult  I have been asked to see the patient by Dr. Manuella Ghazi, for evaluation and management of hematuria and nondraining Foley catheter.  Chief Complaint: Gross hematuria, nondraining Foley catheter, lower abdominal pain and distention  History of Present Illness: Fernando [name] is a 65 y.o. year old male with a history of ETOH abuse admitted after a fall in which he sustained a forehead laceration and subdural hematoma.  He was scheduled for discharge to a SNF yesterday, but tested positive for COVID and remains inpatient on isolation.  This morning, he developed acute urinary retention initially managed with I&O catheterization and subsequently requiring Foley catheter placement.  He has had gross hematuria since Foley catheter placement.  Today he reports no history of enlarged prostate or difficulty voiding.  He has never seen a urologist before.  He reports lower abdominal pain and distention.  No imaging or labs obtained since Foley placement this morning.  Past Medical History:  Diagnosis Date   Alcohol dependence ([place])    Arthritis    Cataract    Chronic systolic (congestive) heart failure (HCC)    EF 25-30% in 10/2019   Dyslipidemia    Essential hypertension    History of kidney stones    Hyperlipidemia    Hypertension    Pneumonia    Prosthetic shoulder infection, initial encounter [facility])     Past Surgical History:  Procedure Laterality Date   BIOPSY  04/21/2020   Procedure: BIOPSY;  Surgeon: Irving Copas., MD;  Location: [place];  Service: Gastroenterology;;   BIOPSY  08/17/2020   Procedure: BIOPSY;  Surgeon: Carol Ada, MD;  Location: [facility] ENDOSCOPY;  Service: Endoscopy;;   COLONOSCOPY     COLONOSCOPY     multiple   ESOPHAGOGASTRODUODENOSCOPY N/A 12/20/2020   Procedure: ESOPHAGOGASTRODUODENOSCOPY (EGD);  Surgeon: Virgel Manifold, MD;  Location[facility] ENDOSCOPY;  Service: Endoscopy;  Laterality: N/A;   ESOPHAGOGASTRODUODENOSCOPY (EGD) WITH  PROPOFOL N/A 01/31/2020   Procedure: ESOPHAGOGASTRODUODENOSCOPY (EGD) WITH PROPOFOL;  Surgeon: Doran Stabler, MD;  Location: [place];  Service: Gastroenterology;  Laterality: N/A;   ESOPHAGOGASTRODUODENOSCOPY (EGD) WITH PROPOFOL N/A 04/21/2020   Procedure: ESOPHAGOGASTRODUODENOSCOPY (EGD) WITH PROPOFOL;  Surgeon: Rush Landmark Telford Nab., MD;  Location: [place];  Service: Gastroenterology;  Laterality: N/A;   ESOPHAGOGASTRODUODENOSCOPY (EGD) WITH PROPOFOL N/A 08/17/2020   Procedure: ESOPHAGOGASTRODUODENOSCOPY (EGD) WITH PROPOFOL;  Surgeon: Carol Ada, MD;  Location: [place];  Service: Endoscopy;  Laterality: N/A;   ESOPHAGOGASTRODUODENOSCOPY (EGD) WITH PROPOFOL N/A 01/26/2021   Procedure: ESOPHAGOGASTRODUODENOSCOPY (EGD) WITH PROPOFOL;  Surgeon: [name], DO;  Location: [facility] ENDOSCOPY;  Service: Gastroenterology;  Laterality: N/A;   EUS N/A 04/21/2020   Procedure: UPPER ENDOSCOPIC ULTRASOUND (EUS) LINEAR;  Surgeon: Irving Copas., MD;  Location: [place];  Service: Gastroenterology;  Laterality: N/A;   [place]   left men. repair   KNEE SURGERY  1975   REVERSE SHOULDER ARTHROPLASTY Right 06/21/2018   Procedure: REVERSE SHOULDER ARTHROPLASTY;  Surgeon: Hiram Gash, MD;  Location: [facility];  Service: Orthopedics;  Laterality: Right;   ROTATOR CUFF REPAIR     right   ROTATOR CUFF REPAIR     TONSILLECTOMY     TOTAL SHOULDER REPLACEMENT  7628   UMBILICAL HERNIA REPAIR      Home Medications:  Current Meds  Medication Sig   omeprazole (PRILOSEC) 40 MG capsule Take 40 mg by mouth daily.    Allergies:  Allergies  Allergen  Reactions   Atorvastatin     Other reaction(s): joints ache   Doxycycline     Other reaction(s): rash/hive    Family History  Problem Relation Age of Onset   Heart failure Mother    Lung cancer Father    Colon cancer Neg Hx    Esophageal cancer Neg Hx    Stomach cancer Neg Hx    Liver  cancer Neg Hx    Pancreatic cancer Neg Hx    Rectal cancer Neg Hx    Inflammatory bowel disease Neg Hx     Social History:  reports that he has been smoking cigarettes. He has a 25.00 pack-year smoking history. He has never used smokeless tobacco. He reports current alcohol use of about 14.0 standard drinks per week. He reports that he does not currently use drugs after having used the following drugs: Marijuana.  ROS: A complete review of systems was performed.  All systems are negative except for pertinent findings as noted.  Physical Exam:  Vital signs in last 24 hours: Temp:  [97.6 F (36.4 C)-99.8 F (37.7 C)] 97.6 F (36.4 C) (10/27 1702) Pulse Rate:  [62-100] 88 (10/27 1702) Resp:  [18] 18 (10/27 0858) BP: (82-119)/(51-70) 108/66 (10/27 1702) SpO2:  [93 %-98 %] 98 % (10/27 1702) Constitutional:  Alert and oriented, no acute distress HEENT: [place], moist mucus membranes, forehead laceration noted Cardiovascular: No clubbing, cyanosis, or edema Respiratory: Normal respiratory effort GU: 16 French silicone catheter in place, nondraining, with coagulated blood in the tubing.  Bright red blood clots protruding from the urethral meatus.  Tender, palpable, distended urinary bladder. Neurologic: Grossly intact, no focal deficits, moving all 4 extremities Psychiatric: Normal mood and affect  Laboratory Data:  Recent Labs    01/27/21 0556 01/28/21 0544 01/29/21 0757  WBC 5.4 4.3 5.3  HGB 7.9* 7.9* 7.6*  HCT 22.9* 23.0* 22.4*   Recent Labs    01/27/21 0556  NA 129*  K 3.7  CL 96*  CO2 26  GLUCOSE 106*  BUN 12  CREATININE 0.74  CALCIUM 8.2*   Urinalysis    Component Value Date/Time   COLORURINE AMBER (A) 12/18/2020 0817   APPEARANCEUR HAZY (A) 12/18/2020 0817   LABSPEC 1.018 12/18/2020 [place] 6.0 12/18/2020 [place] 12/18/2020 0817   HGBUR MODERATE (A) 12/18/2020 0817   BILIRUBINUR NEGATIVE 12/18/2020 0817   KETONESUR 80 (A) 12/18/2020 0817    PROTEINUR 100 (A) 12/18/2020 0817   UROBILINOGEN 1.0 07/21/2008 0647   NITRITE NEGATIVE 12/18/2020 0817   LEUKOCYTESUR NEGATIVE 12/18/2020 0817   Results for orders placed or performed during the hospital encounter of 01/19/21  Resp Panel by RT-PCR (Flu A&B, Covid) Nasopharyngeal Swab     Status: None   Collection Time: 01/19/21  4:39 AM   Specimen: Nasopharyngeal Swab; Nasopharyngeal(NP) swabs in vial transport medium  Result Value Ref Range Status   SARS Coronavirus 2 by RT PCR NEGATIVE NEGATIVE Final    Comment: (NOTE) SARS-CoV-2 target nucleic acids are NOT DETECTED.  The SARS-CoV-2 RNA is generally detectable in upper respiratory specimens during the acute phase of infection. The lowest concentration of SARS-CoV-2 viral copies this assay can detect is 138 copies/mL. A negative result does not preclude SARS-Cov-2 infection and should not be used as the sole basis for treatment or other patient management decisions. A negative result may occur with  improper specimen collection/handling, submission of specimen other than nasopharyngeal swab, presence of viral mutation(s) within the areas  targeted by this assay, and inadequate number of viral copies(<138 copies/mL). A negative result must be combined with clinical observations, patient history, and epidemiological information. The expected result is Negative.  Fact Sheet for Patients:  EntrepreneurPulse.com.au  Fact Sheet for Healthcare Providers:  IncredibleEmployment.be  This test is no t yet approved or cleared by the [place] FDA and  has been authorized for detection and/or diagnosis of SARS-CoV-2 by FDA under an Emergency Use Authorization (EUA). This EUA will remain  in effect (meaning this test can be used) for the duration of the COVID-19 declaration under Section 564(b)(1) of the Act, 21 U.S.C.section 360bbb-3(b)(1), unless the authorization is terminated  or revoked  sooner.       Influenza A by PCR NEGATIVE NEGATIVE Final   Influenza B by PCR NEGATIVE NEGATIVE Final    Comment: (NOTE) The Xpert Xpress SARS-CoV-2/FLU/RSV plus assay is intended as an aid in the diagnosis of influenza from Nasopharyngeal swab specimens and should not be used as a sole basis for treatment. Nasal washings and aspirates are unacceptable for Xpert Xpress SARS-CoV-2/FLU/RSV testing.  Fact Sheet for Patients: EntrepreneurPulse.com.au  Fact Sheet for Healthcare Providers: IncredibleEmployment.be  This test is not yet approved or cleared by the [place] FDA and has been authorized for detection and/or diagnosis of SARS-CoV-2 by FDA under an Emergency Use Authorization (EUA). This EUA will remain in effect (meaning this test can be used) for the duration of the COVID-19 declaration under Section 564(b)(1) of the Act, 21 U.S.C. section 360bbb-3(b)(1), unless the authorization is terminated or revoked.  Performed at [facility], [address] [place], [place] [address]   MRSA Next Gen by PCR, Nasal     Status: None   Collection Time: 01/22/21  4:57 AM   Specimen: Nasal Mucosa; Nasal Swab  Result Value Ref Range Status   MRSA by PCR Next Gen NOT DETECTED NOT DETECTED Final    Comment: (NOTE) The GeneXpert MRSA Assay (FDA approved for NASAL specimens only), is one component of a comprehensive MRSA colonization surveillance program. It is not intended to diagnose MRSA infection nor to guide or monitor treatment for MRSA infections. Test performance is not FDA approved in patients less than 74 years old. Performed at [facility], [address] [place], [place] [address]   Resp Panel by RT-PCR (Flu A&B, Covid) Nasopharyngeal Swab     Status: Abnormal   Collection Time: 01/27/21 11:45 AM   Specimen: Nasopharyngeal Swab; Nasopharyngeal(NP) swabs in vial transport medium  Result Value Ref Range Status    SARS Coronavirus 2 by RT PCR POSITIVE (A) NEGATIVE Final    Comment: RESULT CALLED TO, READ BACK BY AND VERIFIED WITH: PATRICIA BENGTSON 01/27/21 1250 SLM (NOTE) SARS-CoV-2 target nucleic acids are DETECTED.  The SARS-CoV-2 RNA is generally detectable in upper respiratory specimens during the acute phase of infection. Positive results are indicative of the presence of the identified virus, but do not rule out bacterial infection or co-infection with other pathogens not detected by the test. Clinical correlation with patient history and other diagnostic information is necessary to determine patient infection status. The expected result is Negative.  Fact Sheet for Patients: EntrepreneurPulse.com.au  Fact Sheet for Healthcare Providers: IncredibleEmployment.be  This test is not yet approved or cleared by the [place] FDA and  has been authorized for detection and/or diagnosis of SARS-CoV-2 by FDA under an Emergency Use Authorization (EUA).  This EUA will remain in effect (meaning this test can  be used)  for the duration of  the COVID-19 declaration under Section 564(b)(1) of the Act, 21 U.S.C. section 360bbb-3(b)(1), unless the authorization is terminated or revoked sooner.     Influenza A by PCR NEGATIVE NEGATIVE Final   Influenza B by PCR NEGATIVE NEGATIVE Final    Comment: (NOTE) The Xpert Xpress SARS-CoV-2/FLU/RSV plus assay is intended as an aid in the diagnosis of influenza from Nasopharyngeal swab specimens and should not be used as a sole basis for treatment. Nasal washings and aspirates are unacceptable for Xpert Xpress SARS-CoV-2/FLU/RSV testing.  Fact Sheet for Patients: EntrepreneurPulse.com.au  Fact Sheet for Healthcare Providers: IncredibleEmployment.be  This test is not yet approved or cleared by the [place] FDA and has been authorized for detection and/or diagnosis of SARS-CoV-2  by FDA under an Emergency Use Authorization (EUA). This EUA will remain in effect (meaning this test can be used) for the duration of the COVID-19 declaration under Section 564(b)(1) of the Act, 21 U.S.C. section 360bbb-3(b)(1), unless the authorization is terminated or revoked.  Performed at [facility], [address] [place], [place] [address]    Assessment & Plan:  65 year old male admitted after sustaining a subdural hematoma and a fall, now with acute urinary retention after testing positive for COVID.  No notable urologic history.  He has had gross hematuria since Foley catheter was placed this morning.  Etiologies for urinary retention in this patient include BPH, head trauma, COVID infection, polypharmacy, and immobilization due to hospitalization.  Foley catheter exchanged at the bedside this afternoon, see procedure note below for further details.  Foley catheter immediately drained 500 cc of clear, yellow urine with this.   Based on these findings, I suspect his Foley catheter balloon was inflated in his prostate.  Bleeding should resolve on its own; no urgent intervention indicated.  I left irrigation supplies at the bedside and catheter may be irrigated as needed if it stops draining.  Recommend outpatient voiding trial in 1 to 2 weeks.  Okay to continue Flomax, however recommend low threshold to discontinue this if he develops orthostasis given his recent fall.  Cath Change/ Replacement  Patient is present today for a catheter change due to gross hematuria/nondraining Foley.  23ml of water was removed from the balloon, a 42VZ silicone foley cath was removed without difficulty.  Patient was cleaned and prepped in a sterile fashion with betadine and 2% lidocaine jelly was instilled into the urethra. A 20 FR silicone coude foley cath was replaced into the bladder no complications were noted Urine return was noted 596ml and urine was yellow in color. The balloon was  filled with 94ml of sterile water. A night bag was attached for drainage.  Patient tolerated well.    Performed by: Debroah Loop, PA-C and Zara Council, PA-C  Recommendations -Continue Foley catheter, manually irrigate as needed if it stops draining -Continue Flomax, discontinue if patient develops orthostasis -Outpatient voiding trial in our clinic in 1-2 weeks  Thank you for involving me in this patient's care, please page with any further questions or concerns.  Debroah Loop, PA-C 01/29/2021 5:28 PM

## 2021-01-29 NOTE — Assessment & Plan Note (Signed)
Well compensated at this time 

## 2021-01-29 NOTE — Assessment & Plan Note (Signed)
Was sutured on admission and sutures were removed during hospitalization

## 2021-01-29 NOTE — Assessment & Plan Note (Signed)
sutured

## 2021-01-29 NOTE — Assessment & Plan Note (Signed)
Had subdural hematoma on admission s/p fall. Found to have GI bleed with no obvious source on EGD and now has some hematuria likely due to foley instrumentation. Hb 7.6 and hypotensive - 1 PRBC transfusion today

## 2021-01-29 NOTE — Assessment & Plan Note (Signed)
Patient had melena while in the hospital associated with pancytopenia, acute blood loss anemia and thrombocytopenia.  He underwent EGD which showed gastritis but no active bleeding. Continue PPI. Hb 7.6 today, no obvious bleed reported in stool

## 2021-01-29 NOTE — Progress Notes (Signed)
Bladder scanned patient, complaint of bladder being full, bladder pain and cannot start urine. MD made aware, urology consult in place.

## 2021-01-29 NOTE — Assessment & Plan Note (Signed)
Status post fall.  He had a head concussion with a forehead laceration which were sutured in the ED and removed this admission

## 2021-01-29 NOTE — Assessment & Plan Note (Signed)
Repleted and resolved 

## 2021-01-29 NOTE — Progress Notes (Signed)
Progress Note    Fernando [name]   [id number]:[id number]  DOB: 29-May-1955  DOA: 01/19/2021     9 Date of Service: 01/29/2021   Clinical Course  65 year old man with chronic systolic congestive heart failure, essential hypertension, hyperlipidemia, and alcohol abuse.  He presented after a fall and was able to crawl to his phone to call 911.  He was found to have a large laceration on his forehead which was sutured in the emergency room and a subdural hematoma.  Sutures will have to be removed in about 6 days.  Later developed multiple black bowel movements.  Had  platelet transfusion on 01/20/2021.  Patient also has severe hypomagnesemia and hypokalemia on presentation and electrolytes were replaced. Patient developed severe agitation with alcohol withdrawal early morning of 10/20, was placed on Precedex, he also required pressor support and transferred to ICU.  Condition improved again 10/21.  10/26: Patient was scheduled to get discharge on 10/25 but COVID returned positive.  Due to SNF isolation policy discharge was canceled and he will need to stay here till 10/30.  Planned discharge on 10/31 10/27: acute urinary retention requiring foley - hematuria s/p foley and Urology c/s requested    Assessment and Plan * Acute subdural hematoma Status post fall.  He had a head concussion with a forehead laceration which were sutured in the ED and removed this admission  Acute urinary retention Been trying in & out > 24 hrs. Finally foley placed this am but started having hematuria and lower abd pain/distention so urology c/s requested. Started Flomax.  Acute blood loss anemia Had subdural hematoma on admission s/p fall. Found to have GI bleed with no obvious source on EGD and now has some hematuria likely due to foley instrumentation. Hb 7.6 and hypotensive - 1 PRBC transfusion today  Scalp laceration Was sutured on admission and sutures were removed during hospitalization  COVID-19 virus  infection Incidental finding, turned positive on 10/25 at planned SNF discharge which was later canceled due to SNF policy for isolation.  Patient is asymptomatic and does not need treatment  Subdural hematoma S/p fall and stable  History of GI bleed Patient had melena while in the hospital associated with pancytopenia, acute blood loss anemia and thrombocytopenia.  He underwent EGD which showed gastritis but no active bleeding. Continue PPI. Hb 7.6 today, no obvious bleed reported in stool  Hematoma of frontal scalp Sutured in ED and removed later during hospital course as indicated. healing  Laceration of forehead sutured  Hypomagnesemia Repleted and resolved.  Hypokalemia Repleted and resolved.  Chronic diastolic CHF (congestive heart failure) (HCC) Well compensated at this time  Alcoholic liver disease [facility]) Patient is counseled for alcohol cessation.  Alcohol use disorder, moderate, dependence ([place]) Patient is counseled for alcohol cessation.  He did undergo alcohol withdrawal with delirium tremens while in the hospital and was treated appropriately.  Delirium tremens ([place]) Due to alcohol withdrawal.  He was appropriately treated with CIWA protocol.  This is now resolved.     Subjective:  Weak and hypotensive. No other c/o  Objective Vitals:   01/28/21 1951 01/29/21 0434 01/29/21 0858 01/29/21 1227  BP: (!) 106/59 101/60 (!) 82/51 119/70  Pulse: 85 100 62 87  Resp: 18  18   Temp: 98.2 F (36.8 C) 98.2 F (36.8 C) 99.8 F (37.7 C)   TempSrc: Oral Oral Oral   SpO2: 95% 93% 96% 97%  Weight:      Height:  68 kg  Vital signs were reviewed and unremarkable except for: Blood pressure: low    Exam Physical Exam   General appearance:alert and cooperative Head: Sutured forehead laceration Resp:clear to auscultation bilaterally Cardio:regular rate and rhythm, S1, S2 normal, no murmur, click, rub or gallop SY:HNPM, non-tender; bowel sounds normal;  no masses, no organomegaly Extremities:extremities normal, atraumatic, no cyanosis or edema   Labs / Other Information My review of labs, imaging, notes and other tests is significant for anemia     Disposition Plan: Status is: Inpatient  Remains inpatient appropriate because: needing blood transfusion. Also covid isolation till 10/30 for SNF placement. Can be D/C on 10/31   Time spent: 35 minutes [facility] 01/29/2021, 4:31 PM

## 2021-01-29 NOTE — Assessment & Plan Note (Signed)
Patient is counseled for alcohol cessation.  He did undergo alcohol withdrawal with delirium tremens while in the hospital and was treated appropriately.

## 2021-01-29 NOTE — Assessment & Plan Note (Signed)
Been trying in & out > 24 hrs. Finally foley placed this am but started having hematuria and lower abd pain/distention so urology c/s requested. Started Flomax.

## 2021-01-29 NOTE — Assessment & Plan Note (Signed)
Incidental finding, turned positive on 10/25 at planned SNF discharge which was later canceled due to SNF policy for isolation.  Patient is asymptomatic and does not need treatment

## 2021-01-30 LAB — BPAM RBC
Blood Product Expiration Date: 202211272359
ISSUE DATE / TIME: 202210271645
Unit Type and Rh: 5100

## 2021-01-30 LAB — CBC WITH DIFFERENTIAL/PLATELET
Abs Immature Granulocytes: 0.15 10*3/uL — ABNORMAL HIGH (ref 0.00–0.07)
Basophils Absolute: 0 10*3/uL (ref 0.0–0.1)
Basophils Relative: 0 %
Eosinophils Absolute: 0 10*3/uL (ref 0.0–0.5)
Eosinophils Relative: 0 %
HCT: 26.5 % — ABNORMAL LOW (ref 39.0–52.0)
Hemoglobin: 9.3 g/dL — ABNORMAL LOW (ref 13.0–17.0)
Immature Granulocytes: 3 %
Lymphocytes Relative: 18 %
Lymphs Abs: 1 10*3/uL (ref 0.7–4.0)
MCH: 37.3 pg — ABNORMAL HIGH (ref 26.0–34.0)
MCHC: 35.1 g/dL (ref 30.0–36.0)
MCV: 106.4 fL — ABNORMAL HIGH (ref 80.0–100.0)
Monocytes Absolute: 0.7 10*3/uL (ref 0.1–1.0)
Monocytes Relative: 13 %
Neutro Abs: 3.8 10*3/uL (ref 1.7–7.7)
Neutrophils Relative %: 66 %
Platelets: 259 10*3/uL (ref 150–400)
RBC: 2.49 MIL/uL — ABNORMAL LOW (ref 4.22–5.81)
RDW: 21.1 % — ABNORMAL HIGH (ref 11.5–15.5)
Smear Review: NORMAL
WBC: 5.8 10*3/uL (ref 4.0–10.5)
nRBC: 0 % (ref 0.0–0.2)

## 2021-01-30 LAB — BASIC METABOLIC PANEL
Anion gap: 6 (ref 5–15)
BUN: 8 mg/dL (ref 8–23)
CO2: 29 mmol/L (ref 22–32)
Calcium: 8.1 mg/dL — ABNORMAL LOW (ref 8.9–10.3)
Chloride: 96 mmol/L — ABNORMAL LOW (ref 98–111)
Creatinine, Ser: 0.83 mg/dL (ref 0.61–1.24)
GFR, Estimated: >60 mL/min (ref >60)
Glucose, Bld: 118 mg/dL — ABNORMAL HIGH (ref 70–99)
Potassium: 2.9 mmol/L — ABNORMAL LOW (ref 3.5–5.1)
Sodium: 131 mmol/L — ABNORMAL LOW (ref 135–145)

## 2021-01-30 LAB — TYPE AND SCREEN
ABO/RH(D): O POS
Antibody Screen: NEGATIVE
Unit division: 0

## 2021-01-30 LAB — MAGNESIUM: Magnesium: 1.6 mg/dL — ABNORMAL LOW (ref 1.7–2.4)

## 2021-01-30 LAB — PHOSPHORUS: Phosphorus: 3.4 mg/dL (ref 2.5–4.6)

## 2021-01-30 NOTE — Assessment & Plan Note (Signed)
Status post fall.  He had a head concussion with a forehead laceration which were sutured in the ED and removed this admission.

## 2021-01-30 NOTE — Assessment & Plan Note (Addendum)
sutured

## 2021-01-30 NOTE — Assessment & Plan Note (Signed)
Had subdural hematoma on admission s/p fall. Found to have GI bleed with no obvious source on EGD and now has some hematuria likely due to foley instrumentation. Hb 9.3 (7.6) status post 1 PRBC transfusion on 10/27.  Hematuria resolved

## 2021-01-30 NOTE — Progress Notes (Signed)
Progress Note    Fernando [name]   [id number]:[id number]  DOB: 1956/03/07  DOA: 01/19/2021     10 Date of Service: 01/30/2021   Clinical Course  65 year old man with chronic systolic congestive heart failure, essential hypertension, hyperlipidemia, and alcohol abuse.  He presented after a fall and was able to crawl to his phone to call 911.  He was found to have a large laceration on his forehead which was sutured in the emergency room and a subdural hematoma.  Sutures will have to be removed in about 6 days.  Later developed multiple black bowel movements.  Had  platelet transfusion on 01/20/2021.  Patient also has severe hypomagnesemia and hypokalemia on presentation and electrolytes were replaced. Patient developed severe agitation with alcohol withdrawal early morning of 10/20, was placed on Precedex, he also required pressor support and transferred to ICU.  Condition improved again 10/21.  10/26: Patient was scheduled to get discharge on 10/25 but COVID returned positive.  Due to SNF isolation policy discharge was canceled and he will need to stay here till 10/30.  Planned discharge on 10/31 10/27: acute urinary retention requiring foley - hematuria s/p foley and Urology c/s requested 10/28: Foley was manually irrigated by urology yesterday and hematuria has stopped.    Assessment and Plan * Acute subdural hematoma Status post fall.  He had a head concussion with a forehead laceration which were sutured in the ED and removed this admission.  Acute urinary retention Foley catheter was placed on 10/27 and manually irrigated by urologist due to hematuria which has resolved now.  Continue Flomax, will need outpatient urology follow-up for voiding trial in 1 to 2 weeks postdischarge   Acute blood loss anemia Had subdural hematoma on admission s/p fall. Found to have GI bleed with no obvious source on EGD and now has some hematuria likely due to foley instrumentation. Hb 9.3 (7.6) status post 1  PRBC transfusion on 10/27.  Hematuria resolved  Scalp laceration Was sutured on admission and sutures were removed during hospitalization.  COVID-19 virus infection Incidental finding, turned positive on 10/25 at planned SNF discharge which was later canceled due to SNF policy for isolation.  Patient is asymptomatic and does not need treatment.  Subdural hematoma S/p fall and stable.  History of GI bleed Patient had melena while in the hospital associated with pancytopenia, acute blood loss anemia and thrombocytopenia.  He underwent EGD which showed gastritis but no active bleeding. Continue PPI.  Hemoglobin 9.3 status post 1 PRBC transfusion yesterday  Laceration of forehead sutured.  Hypomagnesemia Repleted and resolved  Hypokalemia Repleted and resolved  Chronic diastolic CHF (congestive heart failure) ([place]) Well compensated at this time.  Alcoholic liver disease ([place]) counseled for alcohol cessation.  Alcohol use disorder, moderate, dependence ([place]) Counseled for alcohol cessation.  He did undergo alcohol withdrawal with delirium tremens while in the hospital and was treated appropriately.  Delirium tremens ([place]) Due to alcohol withdrawal.  appropriately treated with CIWA protocol.  This is now resolved     Subjective:  Denies any new issues.  Sitter at bedside  Objective Vitals:   01/29/21 1807 01/29/21 2050 01/30/21 0746 01/30/21 1245  BP: 136/88 124/80 137/80 (!) 101/57  Pulse: 84 93 (!) 104   Resp:  16    Temp:  98.1 F (36.7 C) (!) 97.5 F (36.4 C)   TempSrc:  Oral Oral   SpO2: 99% 93% 93%   Weight:      Height:  68 kg  Vital signs were reviewed and unremarkable.   Exam Physical Exam   General appearance: alert and cooperative Resp: clear to auscultation bilaterally Cardio: regular rate and rhythm, S1, S2 normal, no murmur, click, rub or gallop GI: soft, non-tender; bowel sounds normal; no masses,  no organomegaly Extremities:  extremities normal, atraumatic, no cyanosis or edema  Labs / Other Information My review of labs, imaging, notes and other tests shows no new significant findings.    Disposition Plan: Status is: Inpatient  Remains inpatient appropriate because: Waiting for SNF placement due to COVID isolation.  Can discharge on 10/31        Time spent: 35 minutes [facility] 01/30/2021, 1:05 PM

## 2021-01-30 NOTE — TOC Progression Note (Signed)
Transition of Care [facility]) - Progression Note    Patient Details  Name: Fernando [name] MRN: [id number] Date of Birth: 07/17/1955  Transition of Care [facility]) CM/SW [place], LCSW Phone Number: 01/30/2021, 9:46 AM  Clinical Narrative:  Notified [facility] admissions coordinator that patient will discharge with foley. She confirmed we are still on track for him to discharge on Monday.   Expected Discharge Plan: [place] (SNF recommended, patient declined) Barriers to Discharge: Continued Medical Work up  Expected Discharge Plan and Services Expected Discharge Plan: [place] (SNF recommended, patient declined)   Discharge Planning Services: CM Consult Post Acute Care Choice: [place] arrangements for the past 2 months: Apartment Expected Discharge Date: 01/28/21                                     Social Determinants of Health (SDOH) Interventions    Readmission Risk Interventions Readmission Risk Prevention Plan 01/23/2021 12/19/2020  Transportation Screening Complete Complete  PCP or Specialist Appt within 3-5 Days - Complete  Social Work Consult for [place] Planning/Counseling - Complete  Palliative Care Screening - Not Applicable  Medication Review Press photographer) Complete Complete  PCP or Specialist appointment within 3-5 days of discharge Not Complete -  PCP/Specialist Appt Not Complete comments NMS at this time. -  [place] or Home Care Consult Complete -  SW Recovery Care/Counseling Consult Complete -  Some recent data might be hidden

## 2021-01-30 NOTE — Assessment & Plan Note (Signed)
Well compensated at this time 

## 2021-01-30 NOTE — Assessment & Plan Note (Signed)
Repleted and resolved 

## 2021-01-30 NOTE — Assessment & Plan Note (Signed)
Foley catheter was placed on 10/27 and manually irrigated by urologist due to hematuria which has resolved now.  Continue Flomax, will need outpatient urology follow-up for voiding trial in 1 to 2 weeks postdischarge

## 2021-01-30 NOTE — Assessment & Plan Note (Signed)
counseled for alcohol cessation.

## 2021-01-30 NOTE — Assessment & Plan Note (Signed)
Counseled for alcohol cessation.  He did undergo alcohol withdrawal with delirium tremens while in the hospital and was treated appropriately.

## 2021-01-30 NOTE — Assessment & Plan Note (Signed)
Due to alcohol withdrawal.  appropriately treated with CIWA protocol.  This is now resolved

## 2021-01-30 NOTE — Progress Notes (Signed)
Patient has been trying to get out of bed all night because he wants to use BSC to urinate. Explained to him that he had foley in multiple times but patient is very forgetful. Total urine output for shift is 225 ml. Flushed catheter to make sure is wasn't blocked and flushed well. Bladder scan showed 0 ml.

## 2021-01-30 NOTE — Assessment & Plan Note (Signed)
S/p fall and stable.

## 2021-01-30 NOTE — Assessment & Plan Note (Signed)
Patient had melena while in the hospital associated with pancytopenia, acute blood loss anemia and thrombocytopenia.  He underwent EGD which showed gastritis but no active bleeding. Continue PPI.  Hemoglobin 9.3 status post 1 PRBC transfusion yesterday

## 2021-01-30 NOTE — Assessment & Plan Note (Signed)
Incidental finding, turned positive on 10/25 at planned SNF discharge which was later canceled due to SNF policy for isolation.  Patient is asymptomatic and does not need treatment.

## 2021-01-30 NOTE — Assessment & Plan Note (Signed)
Was sutured on admission and sutures were removed during hospitalization.

## 2021-01-31 LAB — CBC WITH DIFFERENTIAL/PLATELET
Abs Immature Granulocytes: 0.19 10*3/uL — ABNORMAL HIGH (ref 0.00–0.07)
Basophils Absolute: 0 10*3/uL (ref 0.0–0.1)
Basophils Relative: 0 %
Eosinophils Absolute: 0 10*3/uL (ref 0.0–0.5)
Eosinophils Relative: 0 %
HCT: 26.8 % — ABNORMAL LOW (ref 39.0–52.0)
Hemoglobin: 9.3 g/dL — ABNORMAL LOW (ref 13.0–17.0)
Immature Granulocytes: 3 %
Lymphocytes Relative: 21 %
Lymphs Abs: 1.4 10*3/uL (ref 0.7–4.0)
MCH: 36.2 pg — ABNORMAL HIGH (ref 26.0–34.0)
MCHC: 34.7 g/dL (ref 30.0–36.0)
MCV: 104.3 fL — ABNORMAL HIGH (ref 80.0–100.0)
Monocytes Absolute: 0.8 10*3/uL (ref 0.1–1.0)
Monocytes Relative: 11 %
Neutro Abs: 4.6 10*3/uL (ref 1.7–7.7)
Neutrophils Relative %: 65 %
Platelets: 264 10*3/uL (ref 150–400)
RBC: 2.57 MIL/uL — ABNORMAL LOW (ref 4.22–5.81)
RDW: 20.4 % — ABNORMAL HIGH (ref 11.5–15.5)
WBC: 7 10*3/uL (ref 4.0–10.5)
nRBC: 0 % (ref 0.0–0.2)

## 2021-01-31 LAB — BASIC METABOLIC PANEL
Anion gap: 7 (ref 5–15)
BUN: 10 mg/dL (ref 8–23)
CO2: 29 mmol/L (ref 22–32)
Calcium: 8.2 mg/dL — ABNORMAL LOW (ref 8.9–10.3)
Chloride: 95 mmol/L — ABNORMAL LOW (ref 98–111)
Creatinine, Ser: 0.78 mg/dL (ref 0.61–1.24)
GFR, Estimated: >60 mL/min (ref >60)
Glucose, Bld: 92 mg/dL (ref 70–99)
Potassium: 3 mmol/L — ABNORMAL LOW (ref 3.5–5.1)
Sodium: 131 mmol/L — ABNORMAL LOW (ref 135–145)

## 2021-01-31 LAB — PHOSPHORUS: Phosphorus: 3 mg/dL (ref 2.5–4.6)

## 2021-01-31 LAB — MAGNESIUM: Magnesium: 1.3 mg/dL — ABNORMAL LOW (ref 1.7–2.4)

## 2021-01-31 MED ORDER — MAGNESIUM SULFATE 4 GM/100ML IV SOLN
4.0000 g | Freq: Once | INTRAVENOUS | Status: AC
  Administered 2021-01-31: 4 g via INTRAVENOUS
  Filled 2021-01-31: qty 100

## 2021-01-31 NOTE — Assessment & Plan Note (Signed)
Was sutured on admission and sutures were removed during hospitalization

## 2021-01-31 NOTE — Evaluation (Addendum)
Physical Therapy Re-Evaluation Patient Details Name: Fernando [name] MRN: [id number] DOB: [birth year] Today's Date: 01/31/2021  History of Present Illness  65 y.o. male who presents by EMS for evaluation after a fall at home.  The patient says he remembers falling but does not remember exactly the circumstances. Pt had another fall in the emergency department bathroom and sustained another injury to his forehead. Pt found to have acute subdural hematoma. While admitted, pt with multiple bouts of black tarry diarrhea. Past medical history significant for diastolic CHF, alcohol use, alcoholic liver cirrhosis, hypertension, recent hospitalization from 9/15-9/21 with upper GI bleed. Pt was transitioned to the CCU on due to severe agitation with alcohol withdrawal on 10/20 & his condition improved 10/21. Pt was set to d/c on 10/25 but Covid test returned positive. Pt is being seen for PT re-evaluation.  Clinical Impression  Pt seen for PT re-evaluation with pt AxOx4. On this date, pt is able to ambulate in room & bathroom with RUE HHA min assist. Pt with continent BM on toilet. Pt requires seated rest breaks between gait trials 2/2 fatigue. Pt received on 2L/min but placed on room air during session with lowest SPO2 89% after gait but pt able to recover to >90%. Pt placed on 1L/min upon PT exit & nurse made aware. Pt would benefit from STR upon d/c to maximize independence with functional mobility & reduce fall risk prior to return home. Max HR of 130 bpm during session        Recommendations for follow up therapy are one component of a multi-disciplinary discharge planning process, led by the attending physician.  Recommendations may be updated based on patient status, additional functional criteria and insurance authorization.  Follow Up Recommendations Skilled nursing-short term rehab (<3 hours/day)    Assistance Recommended at Discharge Frequent or constant Supervision/Assistance  Functional Status  Assessment Patient has had a recent decline in their functional status and demonstrates the ability to make significant improvements in function in a reasonable and predictable amount of time.  Equipment Recommendations   (TBD in next venue)    Recommendations for Other Services       Precautions / Restrictions Precautions Precautions: Fall Restrictions Weight Bearing Restrictions: No Other Position/Activity Restrictions: Monitor HR      Mobility  Bed Mobility Overal bed mobility: Needs Assistance Bed Mobility: Supine to Sit Rolling: Modified independent (Device/Increase time)   Supine to sit: Modified independent (Device/Increase time);HOB elevated          Transfers Overall transfer level: Needs assistance Equipment used: None Transfers: Sit to/from Stand Sit to Stand: Min assist                Ambulation/Gait Ambulation/Gait assistance: Min assist Gait Distance (Feet): 30 Feet (+ 20 + 25 ft) Assistive device: 1 person hand held assist Gait Pattern/deviations: Decreased step length - right;Decreased step length - left;Decreased stride length     General Gait Details: RUE HHA  Stairs            Wheelchair Mobility    Modified Rankin (Stroke Patients Only)       Balance Overall balance assessment: Needs assistance Sitting-balance support: No upper extremity supported;Feet supported Sitting balance-Leahy Scale: Good Sitting balance - Comments: supervision static sitting EOB   Standing balance support: Single extremity supported;During functional activity Standing balance-Leahy Scale: Fair  Pertinent Vitals/Pain Pain Assessment: No/denies pain    Home Living Family/patient expects to be discharged to:: Private residence Living Arrangements: Alone Available Help at Discharge: Neighbor;Available PRN/intermittently;Friend(s) Type of Home: House Home Access: Stairs to enter Entrance Stairs-Rails:  Right Entrance Stairs-Number of Steps: 1/2 step to enter Alternate Level Stairs-Number of Steps: states he does not go upstairs Home Layout: Two level;Able to live on main level with bedroom/bathroom   Additional Comments: Pt does not use supplemental O2 at baseline    Prior Function Prior Level of Function : Independent/Modified Independent             Mobility Comments: Independent with gait without AD. Drives, prepares simple meals. Reports 3 falls in the past 6 months but previous admissions indicate more than 3 (all falls reported in the bathroom).       Hand Dominance        Extremity/Trunk Assessment   Upper Extremity Assessment Upper Extremity Assessment: Generalized weakness    Lower Extremity Assessment Lower Extremity Assessment: Generalized weakness       Communication   Communication: HOH  Cognition Arousal/Alertness: Awake/alert Behavior During Therapy: WFL for tasks assessed/performed Overall Cognitive Status: Within Functional Limits for tasks assessed                                 General Comments: Pt A&Ox4. Pt is questionable historian.        General Comments General comments (skin integrity, edema, etc.): Pt with continent BM on toilet, performs peri hygiene without assistance    Exercises     Assessment/Plan    PT Assessment Patient needs continued PT services  PT Problem List Decreased strength;Decreased mobility;Decreased safety awareness;Decreased activity tolerance;Cardiopulmonary status limiting activity;Decreased balance;Decreased knowledge of use of DME       PT Treatment Interventions DME instruction;Therapeutic activities;Gait training;Therapeutic exercise;Patient/family education;Balance training;Functional mobility training;Neuromuscular re-education;Stair training    PT Goals (Current goals can be found in the Care Plan section)  Acute Rehab PT Goals Patient Stated Goal: to return home PT Goal Formulation:  With patient Time For Goal Achievement: 02/14/21 Potential to Achieve Goals: Good    Frequency Min 2X/week   Barriers to discharge Decreased caregiver support lives alone    Co-evaluation               AM-PAC PT "6 Clicks" Mobility  Outcome Measure Help needed turning from your back to your side while in a flat bed without using bedrails?: None Help needed moving from lying on your back to sitting on the side of a flat bed without using bedrails?: A Little Help needed moving to and from a bed to a chair (including a wheelchair)?: A Little Help needed standing up from a chair using your arms (e.g., wheelchair or bedside chair)?: A Little Help needed to walk in hospital room?: A Little Help needed climbing 3-5 steps with a railing? : A Lot 6 Click Score: 18    End of Session   Activity Tolerance: Patient tolerated treatment well;Patient limited by fatigue Patient left: in chair;with chair alarm set;with nursing/sitter in room Nurse Communication: Mobility status (O2) PT Visit Diagnosis: Unsteadiness on feet (R26.81);Other abnormalities of gait and mobility (R26.89);Difficulty in walking, not elsewhere classified (R26.2);Repeated falls (R29.6);Muscle weakness (generalized) (M62.81)    Time: 3267-1245 PT Time Calculation (min) (ACUTE ONLY): 26 min   Charges:   PT Evaluation $PT Eval Low Complexity: 1 Low PT Treatments $Therapeutic Activity:  8-22 mins        Lavone Nian, PT, DPT 01/31/21, 1:33 PM   Waunita Schooner 01/31/2021, 1:31 PM

## 2021-01-31 NOTE — Assessment & Plan Note (Signed)
Well compensated at this time 

## 2021-01-31 NOTE — Assessment & Plan Note (Signed)
Counseled for alcohol cessation.  He did undergo alcohol withdrawal with delirium tremens while in the hospital and was treated appropriately

## 2021-01-31 NOTE — Assessment & Plan Note (Signed)
Due to alcohol withdrawal.  appropriately treated with CIWA protocol.  This is now resolved.  Discontinue narcotic and encourage ambulation

## 2021-01-31 NOTE — Assessment & Plan Note (Signed)
Replete and recheck.  Low today

## 2021-01-31 NOTE — Assessment & Plan Note (Signed)
sutured and healing

## 2021-01-31 NOTE — Assessment & Plan Note (Signed)
Incidental finding, turned positive on 10/25 at planned SNF discharge which was later canceled due to SNF policy for isolation.  Patient is asymptomatic and does not need treatment

## 2021-01-31 NOTE — Assessment & Plan Note (Signed)
Patient had melena while in the hospital associated with pancytopenia, acute blood loss anemia and thrombocytopenia.  He underwent EGD which showed gastritis but no active bleeding. Continue PPI.  Hemoglobin 9.3 status post 1 PRBC transfusion on 10/27

## 2021-01-31 NOTE — Assessment & Plan Note (Signed)
counseled for alcohol cessation

## 2021-01-31 NOTE — Assessment & Plan Note (Signed)
S/p fall and stable

## 2021-01-31 NOTE — Assessment & Plan Note (Signed)
Foley catheter was placed on 10/27 and manually irrigated by urologist due to hematuria which has resolved now.  Continue Flomax, will need outpatient urology follow-up for voiding trial in 1 to 2 weeks postdischarge.

## 2021-01-31 NOTE — Progress Notes (Signed)
Progress Note    Fernando [name]   BSW:[id number]  DOB: [birth year]  DOA: 01/19/2021     11 Date of Service: 01/31/2021   Clinical Course  65 year old man with chronic systolic congestive heart failure, essential hypertension, hyperlipidemia, and alcohol abuse.  He presented after a fall and was able to crawl to his phone to call 911.  He was found to have a large laceration on his forehead which was sutured in the emergency room and a subdural hematoma.  Sutures will have to be removed in about 6 days.  Later developed multiple black bowel movements.  Had  platelet transfusion on 01/20/2021.  Patient also has severe hypomagnesemia and hypokalemia on presentation and electrolytes were replaced. Patient developed severe agitation with alcohol withdrawal early morning of 10/20, was placed on Precedex, he also required pressor support and transferred to ICU.  Condition improved again 10/21.  10/26: Patient was scheduled to get discharge on 10/25 but COVID returned positive.  Due to SNF isolation policy discharge was canceled and he will need to stay here till 10/30.  Planned discharge on 10/31 10/27: acute urinary retention requiring foley - hematuria s/p foley and Urology c/s requested 10/28: Foley was manually irrigated by urology yesterday and hematuria has stopped. 10/29: Discontinue sitter.  Plan discharge on 10/31    Assessment and Plan * Acute subdural hematoma Status post fall.  He had a head concussion with a forehead laceration which were sutured in the ED and removed this admission.  Discontinue narcotic and encourage ambulation.  Discontinue sitter  Acute urinary retention Foley catheter was placed on 10/27 and manually irrigated by urologist due to hematuria which has resolved now.  Continue Flomax, will need outpatient urology follow-up for voiding trial in 1 to 2 weeks postdischarge.   Acute blood loss anemia Had subdural hematoma on admission s/p fall. Found to have GI bleed  with no obvious source on EGD and now has some hematuria likely due to foley instrumentation. Hb 9.3 (7.6) status post 1 PRBC transfusion on 10/27.  Hematuria resolved.  Scalp laceration Was sutured on admission and sutures were removed during hospitalization  COVID-19 virus infection Incidental finding, turned positive on 10/25 at planned SNF discharge which was later canceled due to SNF policy for isolation.  Patient is asymptomatic and does not need treatment  Subdural hematoma S/p fall and stable  History of GI bleed Patient had melena while in the hospital associated with pancytopenia, acute blood loss anemia and thrombocytopenia.  He underwent EGD which showed gastritis but no active bleeding. Continue PPI.  Hemoglobin 9.3 status post 1 PRBC transfusion on 10/27  Laceration of forehead sutured and healing  Hypomagnesemia Replete and recheck.  Low today  Hypokalemia Recheck today as potassium was not resulted with other labs.  We will replete if low  Chronic diastolic CHF (congestive heart failure) ([place]) Well compensated at this time  Alcoholic liver disease ([place]) counseled for alcohol cessation  Alcohol use disorder, moderate, dependence ([place]) Counseled for alcohol cessation.  He did undergo alcohol withdrawal with delirium tremens while in the hospital and was treated appropriately  Delirium tremens ([place]) Due to alcohol withdrawal.  appropriately treated with CIWA protocol.  This is now resolved.  Discontinue narcotic and encourage ambulation   Subjective:   Denies any new complaints.  Hoping that he still is on her discharge as scheduled on 10/31  Objective Vitals:   01/30/21 1900 01/31/21 0432 01/31/21 0700 01/31/21 1242  BP: 107/64 127/68 111/69 113/63  Pulse: 98 (!) 103 88   Resp:  18 18   Temp: 97.8 F (36.6 C) 98 F (36.7 C) 97.7 F (36.5 C)   TempSrc: Oral Oral Oral   SpO2: 100% 96% 95%   Weight:      Height:       68 kg  Vital signs were  reviewed and unremarkable.   Exam Physical Exam   General appearance:alert and cooperative Resp:clear to auscultation bilaterally Cardio:regular rate and rhythm, S1, S2 normal, no murmur, click, rub or gallop WP:VXYI, non-tender; bowel sounds normal; no masses, no organomegaly Extremities:extremities normal, atraumatic, no cyanosis or edema  Labs / Other Information My review of labs, imaging, notes and other tests is significant for Low magnesium     Disposition Plan: Status is: Inpatient  Remains inpatient appropriate because: Replacing magnesium today.  Planned discharge to SNF on 10/31     Time spent: 25 minutes [facility] 01/31/2021, 12:49 PM

## 2021-01-31 NOTE — Assessment & Plan Note (Signed)
Recheck today as potassium was not resulted with other labs.  We will replete if low

## 2021-01-31 NOTE — Assessment & Plan Note (Signed)
Had subdural hematoma on admission s/p fall. Found to have GI bleed with no obvious source on EGD and now has some hematuria likely due to foley instrumentation. Hb 9.3 (7.6) status post 1 PRBC transfusion on 10/27.  Hematuria resolved.

## 2021-01-31 NOTE — Evaluation (Addendum)
Occupational Therapy Re-Evaluation Patient Details Name: Fernando [name] MRN: [id number] DOB: 01/29/1956 Today's Date: 01/31/2021   History of Present Illness 65 y.o. male who presents by EMS for evaluation after a fall at home.  The patient says he remembers falling but does not remember exactly the circumstances. Pt had another fall in the emergency department bathroom and sustained another injury to his forehead. Pt found to have acute subdural hematoma. While admitted, pt with multiple bouts of black tarry diarrhea. Past medical history significant for diastolic CHF, alcohol use, alcoholic liver cirrhosis, hypertension, recent hospitalization from 9/15-9/21 with upper GI bleed. Pt was transitioned to the CCU on due to severe agitation with alcohol withdrawal on 10/20 & his condition improved 10/21. Pt was set to d/c on 10/25 but Covid test returned positive. Now being re-evaluated by therapy.   Clinical Impression   Pt seen for OT re-evaluation this date in setting of prolonged hospitalization d/t now being + with COVID. Pt presents with improved mentation this date and is appropriate with all commands. He is somewhat unmotivated/self-limiting requiring gentle encouragement to participate. Pt able to perform bed mobility with MOD I with HOB elevated and requires MIN A for ADL transfers. He is somewhat unsteady and lives alone at baseline. He requires MIN A for seated LB ADLs d/t decreased dynamic sitting balance. While he could potentially return to an appropriate level of perform basic self care before becoming medically stable to discharge, he will likely require increased time to progress with dynamic standing balance and standing tolerance required to be completely INDEP including performing IADLs such as preparing meals and running errands. Will continue to follow acutely.   Recommending STR f/u at this time as pt still requiring assistance in most areas of mobility and self care, but mindful  that he has good potential to progress with therapy intervention now that he is better able to follow commands and participate.     Recommendations for follow up therapy are one component of a multi-disciplinary discharge planning process, led by the attending physician.  Recommendations may be updated based on patient status, additional functional criteria and insurance authorization.   Follow Up Recommendations  Skilled nursing-short term rehab (<3 hours/day)    Assistance Recommended at Discharge Intermittent Supervision/Assistance  Functional Status Assessment  Patient has had a recent decline in their functional status and demonstrates the ability to make significant improvements in function in a reasonable and predictable amount of time.  Equipment Recommendations  BSC;Tub/shower seat;Other (comment) (2ww)    Recommendations for Other Services       Precautions / Restrictions Precautions Precautions: Fall Restrictions Weight Bearing Restrictions: No Other Position/Activity Restrictions: Monitor HR      Mobility Bed Mobility Overal bed mobility: Needs Assistance Bed Mobility: Supine to Sit     Supine to sit: Modified independent (Device/Increase time);HOB elevated Sit to supine: Modified independent (Device/Increase time)        Transfers Overall transfer level: Needs assistance Equipment used: 1 person hand held assist Transfers: Sit to/from Stand Sit to Stand: Min assist           General transfer comment: somewhat impulsive      Balance Overall balance assessment: Needs assistance Sitting-balance support: No upper extremity supported;Feet supported Sitting balance-Leahy Scale: Good Sitting balance - Comments: G static sitting   Standing balance support: Single extremity supported;During functional activity Standing balance-Leahy Scale: Fair Standing balance comment: at least unilateral support  ADL either  performed or assessed with clinical judgement   ADL Overall ADL's : Needs assistance/impaired                                       General ADL Comments: SETUP for seated UB ADLs, MIN A for LB ADLs, MIN A for ADL transfers     Vision Patient Visual Report: No change from baseline       Perception     Praxis      Pertinent Vitals/Pain Pain Assessment: No/denies pain     Hand Dominance Right   Extremity/Trunk Assessment Upper Extremity Assessment Upper Extremity Assessment: Generalized weakness;Overall WFL for tasks assessed (ROM WFL, MMT grossly 4-/5)   Lower Extremity Assessment Lower Extremity Assessment: Generalized weakness;Overall WFL for tasks assessed       Communication Communication Communication: HOH   Cognition Arousal/Alertness: Awake/alert Behavior During Therapy: WFL for tasks assessed/performed Overall Cognitive Status: Within Functional Limits for tasks assessed                                 General Comments: pt is basically oriented, but somewhat forgetful regarding information about his hospital stay/health, ex: keeps forgetting he has foley catheter in.     General Comments       Exercises Other Exercises Other Exercises: OT engages pt in seated bathing/dressing tasks   Shoulder Instructions      Home Living Family/patient expects to be discharged to:: Private residence Living Arrangements: Alone Available Help at Discharge: Neighbor;Available PRN/intermittently;Friend(s) Type of Home: House Home Access: Stairs to enter [employer] of Steps: 1/2 step to enter Entrance Stairs-Rails: Right Home Layout: Two level;Able to live on main level with bedroom/bathroom Alternate Level Stairs-Number of Steps: states he does not go upstairs   Bathroom Shower/Tub: Occupational psychologist: Handicapped height         Additional Comments: Pt does not use supplemental O2 at baseline      Prior  Functioning/Environment               Mobility Comments: Independent with gait without AD. Drives, prepares simple meals. Reports 3 falls in the past 6 months but previous admissions indicate more than 3 (all falls reported in the bathroom). ADLs Comments: prepares simple meals and able to drive and run errands. Sponge bathes.        OT Problem List: Decreased strength;Decreased activity tolerance;Impaired balance (sitting and/or standing);Decreased safety awareness;Decreased knowledge of precautions      OT Treatment/Interventions: Self-care/ADL training;Therapeutic exercise;Energy conservation;DME and/or AE instruction;Therapeutic activities;Patient/family education;Balance training    OT Goals(Current goals can be found in the care plan section) Acute Rehab OT Goals OT Goal Formulation: With patient Time For Goal Achievement: 02/07/21 Potential to Achieve Goals: Fair ADL Goals Pt Will Perform Grooming: with supervision;standing Pt Will Transfer to Toilet: with supervision;ambulating (with LRAD) Pt Will Perform Toileting - Clothing Manipulation and hygiene: with supervision;sit to/from stand  OT Frequency: Min 1X/week   Barriers to D/C: Decreased caregiver support          Co-evaluation              AM-PAC OT "6 Clicks" Daily Activity     Outcome Measure Help from another person eating meals?: None Help from another person taking care of personal grooming?: None Help from another person  toileting, which includes using toliet, bedpan, or urinal?: A Lot Help from another person bathing (including washing, rinsing, drying)?: A Lot Help from another person to put on and taking off regular upper body clothing?: None Help from another person to put on and taking off regular lower body clothing?: A Lot 6 Click Score: 18   End of Session Equipment Utilized During Treatment: Gait belt  Activity Tolerance: Other (comment) (pt somewhat self-limiting) Patient left: in  bed;with call bell/phone within reach;with nursing/sitter in room (sitter present)  OT Visit Diagnosis: Unsteadiness on feet (R26.81);Muscle weakness (generalized) (M62.81);Repeated falls (R29.6)                Time: 9340-6840 OT Time Calculation (min): 16 min Charges:  OT General Charges $OT Visit: 1 Visit OT Evaluation $OT Eval Moderate Complexity: [place], [place], OTR/L ascom [phone] 01/31/21, 6:02 PM

## 2021-01-31 NOTE — Assessment & Plan Note (Signed)
Status post fall.  He had a head concussion with a forehead laceration which were sutured in the ED and removed this admission.  Discontinue narcotic and encourage ambulation.  Discontinue sitter

## 2021-02-01 LAB — CBC WITH DIFFERENTIAL/PLATELET
Abs Immature Granulocytes: 0.19 10*3/uL — ABNORMAL HIGH (ref 0.00–0.07)
Basophils Absolute: 0 10*3/uL (ref 0.0–0.1)
Basophils Relative: 1 %
Eosinophils Absolute: 0 10*3/uL (ref 0.0–0.5)
Eosinophils Relative: 0 %
HCT: 29.5 % — ABNORMAL LOW (ref 39.0–52.0)
Hemoglobin: 10.3 g/dL — ABNORMAL LOW (ref 13.0–17.0)
Immature Granulocytes: 3 %
Lymphocytes Relative: 21 %
Lymphs Abs: 1.3 10*3/uL (ref 0.7–4.0)
MCH: 37.2 pg — ABNORMAL HIGH (ref 26.0–34.0)
MCHC: 34.9 g/dL (ref 30.0–36.0)
MCV: 106.5 fL — ABNORMAL HIGH (ref 80.0–100.0)
Monocytes Absolute: 0.7 10*3/uL (ref 0.1–1.0)
Monocytes Relative: 11 %
Neutro Abs: 4.2 10*3/uL (ref 1.7–7.7)
Neutrophils Relative %: 64 %
Platelets: 304 10*3/uL (ref 150–400)
RBC: 2.77 MIL/uL — ABNORMAL LOW (ref 4.22–5.81)
RDW: 20.5 % — ABNORMAL HIGH (ref 11.5–15.5)
WBC: 6.5 10*3/uL (ref 4.0–10.5)
nRBC: 0 % (ref 0.0–0.2)

## 2021-02-01 LAB — PHOSPHORUS: Phosphorus: 3.6 mg/dL (ref 2.5–4.6)

## 2021-02-01 LAB — MAGNESIUM: Magnesium: 1.9 mg/dL (ref 1.7–2.4)

## 2021-02-01 MED ORDER — POTASSIUM CHLORIDE CRYS ER 20 MEQ PO TBCR
40.0000 meq | EXTENDED_RELEASE_TABLET | Freq: Once | ORAL | Status: AC
  Administered 2021-02-01: 40 meq via ORAL
  Filled 2021-02-01: qty 2

## 2021-02-01 MED ORDER — TRAZODONE HCL 50 MG PO TABS
25.0000 mg | ORAL_TABLET | Freq: Once | ORAL | Status: AC
  Administered 2021-02-01: 25 mg via ORAL
  Filled 2021-02-01: qty 1

## 2021-02-01 MED ORDER — FUROSEMIDE 20 MG PO TABS
20.0000 mg | ORAL_TABLET | Freq: Every day | ORAL | Status: DC
  Administered 2021-02-02: 20 mg via ORAL
  Filled 2021-02-01: qty 1

## 2021-02-01 NOTE — Assessment & Plan Note (Signed)
Counseled for alcohol cessation.  He did undergo alcohol withdrawal with delirium tremens while in the hospital and was treated appropriately.

## 2021-02-01 NOTE — Assessment & Plan Note (Signed)
counseled for alcohol cessation.

## 2021-02-01 NOTE — Assessment & Plan Note (Signed)
Repleted and resolved 

## 2021-02-01 NOTE — Progress Notes (Signed)
Progress Note    Fernando [name]   XVQ:[id number]  DOB: Apr 22, 1955  DOA: 01/19/2021     12 Date of Service: 02/01/2021   Clinical Course  65 year old man with chronic systolic congestive heart failure, essential hypertension, hyperlipidemia, and alcohol abuse.  He presented after a fall and was able to crawl to his phone to call 911.  He was found to have a large laceration on his forehead which was sutured in the emergency room and a subdural hematoma.  Sutures will have to be removed in about 6 days.  Later developed multiple black bowel movements.  Had  platelet transfusion on 01/20/2021.  Patient also has severe hypomagnesemia and hypokalemia on presentation and electrolytes were replaced. Patient developed severe agitation with alcohol withdrawal early morning of 10/20, was placed on Precedex, he also required pressor support and transferred to ICU.  Condition improved again 10/21.  10/26: Patient was scheduled to get discharge on 10/25 but COVID returned positive.  Due to SNF isolation policy discharge was canceled and he will need to stay here till 10/30.  Planned discharge on 10/31 10/27: acute urinary retention requiring foley - hematuria s/p foley and Urology c/s requested 10/28: Foley was manually irrigated by urology yesterday and hematuria has stopped. 10/29: Discontinue sitter.  Plan discharge on 10/31 10/30: Sitter discontinued for more than 24 hours now.  Plan discharge on 10/31    Assessment and Plan * Acute subdural hematoma Status post fall.  He had a head concussion with a forehead laceration which were sutured in the ED and removed this admission.  Discontinue narcotic and encourage ambulation. sitter free more than 24 hours now  COVID-19 virus infection Incidental finding, turned positive on 10/25 at planned SNF discharge which was later canceled due to SNF policy for isolation.  Patient is asymptomatic and does not need treatment.  Subdural hematoma S/p fall and  stable.  History of GI bleed Patient had melena while in the hospital associated with pancytopenia, acute blood loss anemia and thrombocytopenia.  He underwent EGD which showed gastritis but no active bleeding. Continue PPI.  Hemoglobin 9.3 status post 1 PRBC transfusion on 10/27.  Hematoma of frontal scalp Sutured in ED and removed later during hospital course as indicated. healing.  Hypomagnesemia Repleted and resolved  Hypokalemia Replete and recheck  Chronic diastolic CHF (congestive heart failure) ([place]) Well compensated at this time.  We will reduce Lasix from 40 to 20 mg daily  Alcoholic liver disease ([place]) counseled for alcohol cessation.  Alcohol use disorder, moderate, dependence ([place]) Counseled for alcohol cessation.  He did undergo alcohol withdrawal with delirium tremens while in the hospital and was treated appropriately.  Delirium tremens ([place]) Due to alcohol withdrawal.  appropriately treated with CIWA protocol.  This is now resolved.  Discontinue narcotic and encourage ambulation     Subjective:  No new complaints.  Looking forward to get to rehab tomorrow  Objective Vitals:   02/01/21 0303 02/01/21 0843 02/01/21 0930 02/01/21 1100  BP: (!) 102/54 111/72 (!) 93/57   Pulse: (!) 103 (!) 104 (!) 106 98  Resp: 20 18 18    Temp: 98.3 F (36.8 C) 98 F (36.7 C) (!) 97.5 F (36.4 C)   TempSrc: Oral Oral Oral   SpO2: 98% 94% 96%   Weight:      Height:       68 kg  Vital signs were reviewed and unremarkable except for: Blood pressure: Low    Exam Physical Exam  General appearance:  alert and cooperative Resp: clear to auscultation bilaterally Cardio: regular rate and rhythm, S1, S2 normal, no murmur, click, rub or gallop GI: soft, non-tender; bowel sounds normal; no masses,  no organomegaly Extremities: extremities normal, atraumatic, no cyanosis or edema  Labs / Other Information My review of labs, imaging, notes and other tests is significant for  Hypokalemia     Disposition Plan: Status is: Inpatient  Remains inpatient appropriate because: SNF on 10/31        Time spent: 25 minutes [facility] 02/01/2021, 11:47 AM

## 2021-02-01 NOTE — Assessment & Plan Note (Signed)
Status post fall.  He had a head concussion with a forehead laceration which were sutured in the ED and removed this admission.  Discontinue narcotic and encourage ambulation. sitter free more than 24 hours now

## 2021-02-01 NOTE — Assessment & Plan Note (Signed)
S/p fall and stable.

## 2021-02-01 NOTE — Assessment & Plan Note (Signed)
Replete and recheck ?

## 2021-02-01 NOTE — Assessment & Plan Note (Signed)
Incidental finding, turned positive on 10/25 at planned SNF discharge which was later canceled due to SNF policy for isolation.  Patient is asymptomatic and does not need treatment.

## 2021-02-01 NOTE — Assessment & Plan Note (Signed)
Sutured in ED and removed later during hospital course as indicated. healing.

## 2021-02-01 NOTE — Assessment & Plan Note (Signed)
Patient had melena while in the hospital associated with pancytopenia, acute blood loss anemia and thrombocytopenia.  He underwent EGD which showed gastritis but no active bleeding. Continue PPI.  Hemoglobin 9.3 status post 1 PRBC transfusion on 10/27.

## 2021-02-01 NOTE — Assessment & Plan Note (Addendum)
Well compensated at this time.  We will reduce Lasix from 40 to 20 mg daily

## 2021-02-01 NOTE — Assessment & Plan Note (Signed)
Due to alcohol withdrawal.  appropriately treated with CIWA protocol.  This is now resolved.  Discontinue narcotic and encourage ambulation

## 2021-02-02 LAB — MAGNESIUM: Magnesium: 1.8 mg/dL (ref 1.7–2.4)

## 2021-02-02 LAB — CBC WITH DIFFERENTIAL/PLATELET
Abs Immature Granulocytes: 0.14 10*3/uL — ABNORMAL HIGH (ref 0.00–0.07)
Basophils Absolute: 0 10*3/uL (ref 0.0–0.1)
Basophils Relative: 1 %
Eosinophils Absolute: 0 10*3/uL (ref 0.0–0.5)
Eosinophils Relative: 1 %
HCT: 26.6 % — ABNORMAL LOW (ref 39.0–52.0)
Hemoglobin: 9.1 g/dL — ABNORMAL LOW (ref 13.0–17.0)
Immature Granulocytes: 2 %
Lymphocytes Relative: 22 %
Lymphs Abs: 1.4 10*3/uL (ref 0.7–4.0)
MCH: 37.3 pg — ABNORMAL HIGH (ref 26.0–34.0)
MCHC: 34.2 g/dL (ref 30.0–36.0)
MCV: 109 fL — ABNORMAL HIGH (ref 80.0–100.0)
Monocytes Absolute: 0.8 10*3/uL (ref 0.1–1.0)
Monocytes Relative: 13 %
Neutro Abs: 4.1 10*3/uL (ref 1.7–7.7)
Neutrophils Relative %: 61 %
Platelets: 279 10*3/uL (ref 150–400)
RBC: 2.44 MIL/uL — ABNORMAL LOW (ref 4.22–5.81)
RDW: 20.5 % — ABNORMAL HIGH (ref 11.5–15.5)
WBC: 6.5 10*3/uL (ref 4.0–10.5)
nRBC: 0 % (ref 0.0–0.2)

## 2021-02-02 LAB — BASIC METABOLIC PANEL
Anion gap: 7 (ref 5–15)
BUN: 11 mg/dL (ref 8–23)
CO2: 29 mmol/L (ref 22–32)
Calcium: 8.5 mg/dL — ABNORMAL LOW (ref 8.9–10.3)
Chloride: 98 mmol/L (ref 98–111)
Creatinine, Ser: 0.76 mg/dL (ref 0.61–1.24)
GFR, Estimated: >60 mL/min (ref >60)
Glucose, Bld: 93 mg/dL (ref 70–99)
Potassium: 3.5 mmol/L (ref 3.5–5.1)
Sodium: 134 mmol/L — ABNORMAL LOW (ref 135–145)

## 2021-02-02 LAB — PHOSPHORUS: Phosphorus: 3.4 mg/dL (ref 2.5–4.6)

## 2021-02-02 MED ORDER — TAMSULOSIN HCL 0.4 MG PO CAPS: 0.4000 mg | ORAL_CAPSULE | Freq: Every day | ORAL | Status: DC

## 2021-02-02 NOTE — Plan of Care (Signed)

## 2021-02-02 NOTE — TOC Progression Note (Addendum)
Transition of Care [facility]) - Progression Note    Patient Details  Name: Fernando [name] MRN: [id number] Date of Birth: 1956-01-26  Transition of [facility]) CM/SW [place], LCSW Phone Number: 02/02/2021, 8:13 AM  Clinical Narrative:   Discharge summary sent to SNF on hub. Left message for admissions coordinator to notify and see when transport could be arranged.  9:34 am: Admissions coordinator is in morning meeting and will call back.  Expected Discharge Plan: [place] (SNF recommended, patient declined) Barriers to Discharge: Continued Medical Work up  Expected Discharge Plan and Services Expected Discharge Plan: [place] (SNF recommended, patient declined)   Discharge Planning Services: CM Consult Post Acute Care Choice: [place] arrangements for the past 2 months: Apartment Expected Discharge Date: 02/02/21                                     Social Determinants of Health (SDOH) Interventions    Readmission Risk Interventions Readmission Risk Prevention Plan 01/23/2021 12/19/2020  Transportation Screening Complete Complete  PCP or Specialist Appt within 3-5 Days - Complete  Social Work Consult for [place] Planning/Counseling - Complete  Palliative Care Screening - Not Applicable  Medication Review Press photographer) Complete Complete  PCP or Specialist appointment within 3-5 days of discharge Not Complete -  PCP/Specialist Appt Not Complete comments NMS at this time. -  [place] or Home Care Consult Complete -  SW Recovery Care/Counseling Consult Complete -  Some recent data might be hidden

## 2021-02-02 NOTE — TOC Transition Note (Addendum)
Transition of Care [facility]) - CM/SW Discharge Note   Patient Details  Name: Fernando [name] MRN: [id number] Date of Birth: 11-01-1955  Transition of Care [facility]) CM/SW Contact:  Candie Chroman, LCSW Phone Number: 02/02/2021, 11:53 AM   Clinical Narrative:   Patient has orders to discharge to [address] in [place] today. RN will call report to [phone] (Room 142). EMS has been arranged and he is 4th on the list. Tried calling in the room but no answer. RN will notify him next time she goes in the room. No further concerns. CSW signing off.  1:00 pm: Received call back from son. Provided update. RN will call him when EMS arrives.  Final next level of care: [place] Barriers to Discharge: Barriers Resolved   Patient Goals and CMS Choice     Choice offered to / list presented to : NA  Discharge Placement PASRR number recieved: 01/24/21            Patient chooses bed at: Other - please specify in the comment section below: ([place]) Patient to be transferred to facility by: EMS Name of family member notified: Left voicemail for son, Fernando [name]. Made him aware on 10/26 of plan for discharge date of 10/31. Spoke to brother Fernando [name] and made him aware. Patient and family notified of of transfer: 02/02/21  Discharge Plan and Services   Discharge Planning Services: CM Consult Post Acute Care Choice: Home Health                               Social Determinants of Health (SDOH) Interventions     Readmission Risk Interventions Readmission Risk Prevention Plan 01/23/2021 12/19/2020  Transportation Screening Complete Complete  PCP or Specialist Appt within 3-5 Days - Complete  Social Work Consult for [place] Planning/Counseling - Complete  Palliative Care Screening - Not Applicable  Medication Review Press photographer) Complete Complete  PCP or Specialist appointment within 3-5 days of discharge Not Complete -   PCP/Specialist Appt Not Complete comments NMS at this time. -  [place] or Home Care Consult Complete -  SW Recovery Care/Counseling Consult Complete -  Some recent data might be hidden

## 2021-02-02 NOTE — Care Management Important Message (Signed)
Important Message  Patient Details  Name: RENNER SEBALD MRN: [id number] Date of Birth: 1956-01-28   Medicare Important Message Given:  Yes  Reviewed Medicare IM with patient via room phone due to isolation status.  Copy of Medicare IM previously delivered to room prior to isolation.     Dannette Barbara 02/02/2021, 11:09 AM

## 2021-02-02 NOTE — Progress Notes (Signed)
Report given to [place].

## 2021-02-06 ENCOUNTER — Emergency Department
Admission: EM | Admit: 2021-02-06 | Discharge: 2021-02-06 | Disposition: A | Discharge status: Home / Self Care | Payer: Medicare Other | Attending: Emergency Medicine | Admitting: Emergency Medicine

## 2021-02-06 DIAGNOSIS — Z5321 Procedure and treatment not carried out due to patient leaving prior to being seen by health care provider: Secondary | ICD-10-CM | POA: Insufficient documentation

## 2021-02-06 DIAGNOSIS — Z466 Encounter for fitting and adjustment of urinary device: Secondary | ICD-10-CM | POA: Insufficient documentation

## 2021-02-12 ENCOUNTER — Ambulatory Visit: Payer: Medicare Other | Admitting: Physician Assistant

## 2021-02-13 ENCOUNTER — Ambulatory Visit (INDEPENDENT_AMBULATORY_CARE_PROVIDER_SITE_OTHER): Payer: Medicare Other | Admitting: Physician Assistant

## 2021-02-13 ENCOUNTER — Other Ambulatory Visit: Payer: Self-pay

## 2021-02-13 ENCOUNTER — Ambulatory Visit: Payer: Medicare Other | Admitting: Physician Assistant

## 2021-02-13 DIAGNOSIS — R338 Other retention of urine: Secondary | ICD-10-CM

## 2021-02-13 NOTE — Progress Notes (Signed)
Catheter Removal  Patient is present today for a catheter removal.  28ml of water was drained from the balloon. A silicone 46FJ coude foley cath was removed from the bladder no complications were noted . Patient tolerated well.  Performed by: Bradly Bienenstock CMA  Follow up/ Additional notes: RTC this afternoon for PVR.

## 2021-02-13 NOTE — Progress Notes (Signed)
Patient was scheduled for outpatient voiding trial after going into retention during a recent hospitalization for subdural hematoma sustained in a fall.  Foley catheter was removed in the morning, see separate procedure note for details.  He was scheduled and counseled to return to clinic in the afternoon for repeat PVR and counseling, however he no showed this appointment.  Debroah Loop, PA-C 02/13/21 3:22 PM

## 2021-02-16 ENCOUNTER — Emergency Department
Admission: EM | Admit: 2021-02-16 | Discharge: 2021-02-16 | Disposition: A | Discharge status: Home / Self Care | Payer: Medicare Other | Attending: Emergency Medicine | Admitting: Emergency Medicine

## 2021-02-16 ENCOUNTER — Other Ambulatory Visit: Payer: Self-pay

## 2021-02-16 ENCOUNTER — Emergency Department: Payer: Medicare Other

## 2021-02-16 DIAGNOSIS — N3001 Acute cystitis with hematuria: Secondary | ICD-10-CM

## 2021-02-16 DIAGNOSIS — F10239 Alcohol dependence with withdrawal, unspecified: Secondary | ICD-10-CM | POA: Diagnosis not present

## 2021-02-16 DIAGNOSIS — I110 Hypertensive heart disease with heart failure: Secondary | ICD-10-CM | POA: Diagnosis not present

## 2021-02-16 DIAGNOSIS — R000 Tachycardia, unspecified: Secondary | ICD-10-CM | POA: Insufficient documentation

## 2021-02-16 DIAGNOSIS — F10930 Alcohol use, unspecified with withdrawal, uncomplicated: Secondary | ICD-10-CM

## 2021-02-16 DIAGNOSIS — Z8616 Personal history of COVID-19: Secondary | ICD-10-CM | POA: Insufficient documentation

## 2021-02-16 DIAGNOSIS — R109 Unspecified abdominal pain: Secondary | ICD-10-CM | POA: Insufficient documentation

## 2021-02-16 DIAGNOSIS — Z96619 Presence of unspecified artificial shoulder joint: Secondary | ICD-10-CM | POA: Diagnosis not present

## 2021-02-16 DIAGNOSIS — R39198 Other difficulties with micturition: Secondary | ICD-10-CM | POA: Diagnosis present

## 2021-02-16 DIAGNOSIS — F17210 Nicotine dependence, cigarettes, uncomplicated: Secondary | ICD-10-CM | POA: Diagnosis not present

## 2021-02-16 DIAGNOSIS — I5043 Acute on chronic combined systolic (congestive) and diastolic (congestive) heart failure: Secondary | ICD-10-CM | POA: Diagnosis not present

## 2021-02-16 LAB — URINALYSIS, ROUTINE W REFLEX MICROSCOPIC
Bacteria, UA: NONE SEEN
Bilirubin Urine: NEGATIVE
Glucose, UA: NEGATIVE mg/dL
Ketones, ur: 20 mg/dL — AB
Nitrite: NEGATIVE
Protein, ur: NEGATIVE mg/dL
Specific Gravity, Urine: 1.011 (ref 1.005–1.030)
Squamous Epithelial / HPF: NONE SEEN (ref 0–5)
WBC, UA: >50 WBC/hpf — ABNORMAL HIGH (ref 0–5)
pH: 6 (ref 5.0–8.0)

## 2021-02-16 LAB — BASIC METABOLIC PANEL
Anion gap: 15 (ref 5–15)
BUN: 7 mg/dL — ABNORMAL LOW (ref 8–23)
CO2: 21 mmol/L — ABNORMAL LOW (ref 22–32)
Calcium: 8.3 mg/dL — ABNORMAL LOW (ref 8.9–10.3)
Chloride: 99 mmol/L (ref 98–111)
Creatinine, Ser: 0.73 mg/dL (ref 0.61–1.24)
GFR, Estimated: >60 mL/min (ref >60)
Glucose, Bld: 101 mg/dL — ABNORMAL HIGH (ref 70–99)
Potassium: 3.8 mmol/L (ref 3.5–5.1)
Sodium: 135 mmol/L (ref 135–145)

## 2021-02-16 LAB — CBC
HCT: 34.4 % — ABNORMAL LOW (ref 39.0–52.0)
Hemoglobin: 11.6 g/dL — ABNORMAL LOW (ref 13.0–17.0)
MCH: 37.1 pg — ABNORMAL HIGH (ref 26.0–34.0)
MCHC: 33.7 g/dL (ref 30.0–36.0)
MCV: 109.9 fL — ABNORMAL HIGH (ref 80.0–100.0)
Platelets: 172 10*3/uL (ref 150–400)
RBC: 3.13 MIL/uL — ABNORMAL LOW (ref 4.22–5.81)
RDW: 18.5 % — ABNORMAL HIGH (ref 11.5–15.5)
WBC: 9.4 10*3/uL (ref 4.0–10.5)
nRBC: 0 % (ref 0.0–0.2)

## 2021-02-16 MED ORDER — THIAMINE HCL 100 MG PO TABS
100.0000 mg | ORAL_TABLET | Freq: Every day | ORAL | Status: DC
  Administered 2021-02-16: 100 mg via ORAL
  Filled 2021-02-16: qty 1

## 2021-02-16 MED ORDER — CEPHALEXIN 500 MG PO CAPS: 500.0000 mg | ORAL_CAPSULE | Freq: Four times a day (QID) | ORAL | 0 refills | Status: DC

## 2021-02-16 MED ORDER — LORAZEPAM 2 MG PO TABS: 0.0000 mg | ORAL_TABLET | Freq: Four times a day (QID) | ORAL | Status: DC

## 2021-02-16 MED ORDER — LORAZEPAM 2 MG PO TABS: 0.0000 mg | ORAL_TABLET | Freq: Two times a day (BID) | ORAL | Status: DC

## 2021-02-16 MED ORDER — CEPHALEXIN 500 MG PO CAPS: 500.0000 mg | ORAL_CAPSULE | Freq: Once | ORAL | Status: DC

## 2021-02-16 MED ORDER — LORAZEPAM 2 MG/ML IJ SOLN
0.0000 mg | Freq: Four times a day (QID) | INTRAMUSCULAR | Status: DC
  Administered 2021-02-16: 2 mg via INTRAVENOUS
  Filled 2021-02-16: qty 1

## 2021-02-16 MED ORDER — LACTATED RINGERS IV BOLUS
1000.0000 mL | Freq: Once | INTRAVENOUS | Status: AC
  Administered 2021-02-16: 1000 mL via INTRAVENOUS

## 2021-02-16 MED ORDER — LORAZEPAM 2 MG/ML IJ SOLN
0.0000 mg | Freq: Two times a day (BID) | INTRAMUSCULAR | Status: DC
Start: 2021-02-18 — End: 2021-02-16

## 2021-02-16 MED ORDER — THIAMINE HCL 100 MG/ML IJ SOLN: 100.0000 mg | Freq: Every day | INTRAMUSCULAR | Status: DC

## 2021-02-16 MED ORDER — SODIUM CHLORIDE 0.9 % IV SOLN
1.0000 g | Freq: Once | INTRAVENOUS | Status: AC
  Administered 2021-02-16: 1 g via INTRAVENOUS
  Filled 2021-02-16: qty 10

## 2021-02-16 NOTE — ED Provider Notes (Signed)
Emergency Medicine Provider Triage Evaluation Note  Fernando [name] , a 65 y.o. male  was evaluated in triage.  Pt complains of urinary retention, history of kidney stones 10 years ago.  Review of Systems  Positive: Urinary retention Negative: Fever, chills, vomiting diarrhea  Physical Exam  BP (!) 139/100   Pulse (!) 132   Temp 98 F (36.7 C)   Resp 19   SpO2 94%  Gen:   Awake, no distress   Resp:  Normal effort  MSK:   Moves extremities without difficulty  Other:    Medical Decision Making  Medically screening exam initiated at 1:50 PM.  Appropriate orders placed.  Fernando [name] was informed that the remainder of the evaluation will be completed by another provider, this initial triage assessment does not replace that evaluation, and the importance of remaining in the ED until their evaluation is complete.  Bladder scan showed minimal, 66 mL   [name], PA-C 02/16/21 1350    [name], MD 02/16/21 1538

## 2021-02-16 NOTE — ED Notes (Signed)
Bladder scan 66 mL

## 2021-02-16 NOTE — ED Provider Notes (Signed)
[facility] Emergency Department Provider Note  ____________________________________________   Event Date/Time   First MD Initiated Contact with Patient 02/16/21 1440     (approximate)  I have reviewed the triage vital signs and the nursing notes.   HISTORY  Chief Complaint Urinary Retention   HPI Fernando [name] is a 65 y.o. male with chronic systolic congestive heart failure, essential hypertension, hyperlipidemia, and alcohol abuse and recent admission 10/17-10/21 for acute subdural after a fall who presents for assessment of some difficulty urinating that he noticed this morning.  He states he had a little discomfort earlier this morning but since coming to emergency room has not had any.  No recent falls or injuries since being discharged he states he is still drinking about 2 to 3 cups of vodka or bourbon per day.  No alcohol today.  He has not had any new headaches, earache, sore throat, fevers, chest pain, cough, shortness of breath or actual burning with urination.  No back pain, no diarrhea or constipation or focal extremity weakness numbness or tingling.  No other acute concerns at this time         Past Medical History:  Diagnosis Date   Alcohol dependence ([place])    Arthritis    Cataract    Chronic systolic (congestive) heart failure (HCC)    EF 25-30% in 10/2019   Dyslipidemia    Essential hypertension    History of kidney stones    Hyperlipidemia    Hypertension    Pneumonia    Prosthetic shoulder infection, initial encounter [facility])     Patient Active Problem List   Diagnosis Date Noted   Iron deficiency anemia due to chronic blood loss 01/29/2021   Acute blood loss anemia 01/29/2021   Acute urinary retention 01/29/2021   Scalp laceration    COVID-19 virus infection 01/27/2021   Hypokalemia 01/19/2021   Hypomagnesemia 01/19/2021   Laceration of forehead 01/19/2021   Acute subdural hematoma 01/19/2021   Hematoma of frontal scalp  01/19/2021   History of GI bleed 01/19/2021   Subdural hematoma 01/19/2021   Alcohol abuse    Elevated AST (SGOT)    Malnutrition of moderate degree 69/67/8938   Alcoholic ketoacidosis 01/19/5101   Chronic diastolic CHF (congestive heart failure) ([place]) 58/52/7782   Alcoholic cirrhosis of liver without ascites ([place])    Acute GI bleeding 08/16/2020   Abnormal CT of the abdomen 05/16/2020   Pancreatic disease 05/16/2020   Cyst of pancreas 05/16/2020   Acute on chronic combined systolic and diastolic CHF (congestive heart failure) ([place]) 02/05/2020   Anemia of chronic disease    Alcoholic liver disease ([place])    Portal hypertensive gastropathy (HCC)    Pancreatic mass    Sinus tachycardia    AKI (acute kidney injury) ([place]) 01/27/2020   Fall 01/26/2020   Hypotension 01/26/2020   Tobacco dependence 01/26/2020   Dislocation of prosthetic shoulder joint, initial encounter ([place])    Essential hypertension    Dyslipidemia    Alcohol use disorder, moderate, dependence ([place])    Pneumonitis    Generalized weakness 42/35/3614   Alcoholic hepatitis 43/15/4008   Sinus tachycardia 10/25/2019   Hyperammonemia ([place]) 10/25/2019   Delirium tremens ([place]) 10/25/2019   Acute hypoxemic respiratory failure ([place]) 10/25/2019   Macrocytic anemia 10/25/2019   Thrombocytopenia ([place]) 10/25/2019   Gout    Tobacco abuse    Stasis dermatitis of both legs 09/04/2018   Medication monitoring encounter 08/09/2018   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   Rotator cuff tear arthropathy, right 06/21/2018    Past Surgical History:  Procedure Laterality Date   BIOPSY  04/21/2020   Procedure: BIOPSY;  Surgeon: Rush Landmark Telford Nab., MD;  Location: [facility] ENDOSCOPY;  Service: Gastroenterology;;   BIOPSY  08/17/2020   Procedure: BIOPSY;  Surgeon: Carol Ada, MD;  Location: [place];  Service: Endoscopy;;   COLONOSCOPY     COLONOSCOPY     multiple   ESOPHAGOGASTRODUODENOSCOPY N/A 12/20/2020    Procedure: ESOPHAGOGASTRODUODENOSCOPY (EGD);  Surgeon: Virgel Manifold, MD;  Location: [facility] ENDOSCOPY;  Service: Endoscopy;  Laterality: N/A;   ESOPHAGOGASTRODUODENOSCOPY (EGD) WITH PROPOFOL N/A 01/31/2020   Procedure: ESOPHAGOGASTRODUODENOSCOPY (EGD) WITH PROPOFOL;  Surgeon: Doran Stabler, MD;  Location: [place];  Service: Gastroenterology;  Laterality: N/A;   ESOPHAGOGASTRODUODENOSCOPY (EGD) WITH PROPOFOL N/A 04/21/2020   Procedure: ESOPHAGOGASTRODUODENOSCOPY (EGD) WITH PROPOFOL;  Surgeon: Rush Landmark Telford Nab., MD;  Location: [place];  Service: Gastroenterology;  Laterality: N/A;   ESOPHAGOGASTRODUODENOSCOPY (EGD) WITH PROPOFOL N/A 08/17/2020   Procedure: ESOPHAGOGASTRODUODENOSCOPY (EGD) WITH PROPOFOL;  Surgeon: Carol Ada, MD;  Location: [place];  Service: Endoscopy;  Laterality: N/A;   ESOPHAGOGASTRODUODENOSCOPY (EGD) WITH PROPOFOL N/A 01/26/2021   Procedure: ESOPHAGOGASTRODUODENOSCOPY (EGD) WITH PROPOFOL;  Surgeon: [name], DO;  Location[facility] ENDOSCOPY;  Service: Gastroenterology;  Laterality: N/A;   EUS N/A 04/21/2020   Procedure: UPPER ENDOSCOPIC ULTRASOUND (EUS) LINEAR;  Surgeon: Irving Copas., MD;  Location: [place];  Service: Gastroenterology;  Laterality: N/A;   [place]   left men. repair   KNEE SURGERY  1975   REVERSE SHOULDER ARTHROPLASTY Right 06/21/2018   Procedure: REVERSE SHOULDER ARTHROPLASTY;  Surgeon: Hiram Gash, MD;  Location: [facility];  Service: Orthopedics;  Laterality: Right;   ROTATOR CUFF REPAIR     right   ROTATOR CUFF REPAIR     TONSILLECTOMY     TOTAL SHOULDER REPLACEMENT  [id number]   UMBILICAL HERNIA REPAIR      Prior to Admission medications   Medication Sig Start Date End Date Taking? Authorizing Provider  allopurinol (ZYLOPRIM) 100 MG tablet Take 100 mg by mouth daily. 08/09/18   [provider]  ipratropium-albuterol (DUONEB) 0.5-2.5 (3) MG/3ML SOLN Take 3 mLs by  nebulization every 6 (six) hours as needed. 01/27/21   Sharen Hones, MD  pantoprazole (PROTONIX) 40 MG tablet Take 40 mg by mouth daily. 02/05/21   [provider]  tamsulosin (FLOMAX) 0.4 MG CAPS capsule Take 1 capsule (0.4 mg total) by mouth daily. 02/02/21   Max Sane, MD    Allergies Atorvastatin and Doxycycline  Family History  Problem Relation Age of Onset   Heart failure Mother    Lung cancer Father    Colon cancer Neg Hx    Esophageal cancer Neg Hx    Stomach cancer Neg Hx    Liver cancer Neg Hx    Pancreatic cancer Neg Hx    Rectal cancer Neg Hx    Inflammatory bowel disease Neg Hx     Social History Social History   Tobacco Use   Smoking status: Every Day    Packs/day: 0.50    Years: 50.00    Pack years: 25.00    Types: Cigarettes   Smokeless tobacco: Never  Vaping Use   Vaping Use: Never used  Substance Use Topics   Alcohol use: Yes    Alcohol/week: 14.0 standard drinks    Types: 14 Shots of  liquor per week   Drug use: Not Currently    Types: Marijuana    Review of Systems  Review of Systems  Constitutional:  Negative for chills and fever.  HENT:  Negative for sore throat.   Eyes:  Negative for pain.  Respiratory:  Negative for cough and stridor.   Cardiovascular:  Negative for chest pain.  Gastrointestinal:  Positive for abdominal pain (lower earlier this AM, now resolved). Negative for vomiting.  Genitourinary:  Negative for dysuria.  Musculoskeletal:  Negative for myalgias.  Skin:  Negative for rash.  Neurological:  Negative for seizures, loss of consciousness and headaches.  Psychiatric/Behavioral:  Positive for substance abuse. Negative for suicidal ideas.   All other systems reviewed and are negative.    ____________________________________________   PHYSICAL EXAM:  VITAL SIGNS: ED Triage Vitals  Enc Vitals Group     BP 02/16/21 1343 (!) 139/100     Pulse Rate 02/16/21 1343 (!) 132     Resp 02/16/21 1343 19     Temp  02/16/21 1343 98 F (36.7 C)     Temp src --      SpO2 02/16/21 1343 94 %     Weight --      Height --      Head Circumference --      Peak Flow --      Pain Score 02/16/21 1341 4     Pain Loc --      Pain Edu? --      Excl. in [place]? --    Vitals:   02/16/21 1343  BP: (!) 139/100  Pulse: (!) 132  Resp: 19  Temp: 98 F (36.7 C)  SpO2: 94%   Physical Exam Vitals and nursing note reviewed.  Constitutional:      Appearance: He is well-developed.  HENT:     Head: Normocephalic and atraumatic.     Right Ear: External ear normal.     Left Ear: External ear normal.     Nose: Nose normal.     Mouth/Throat:     Mouth: Mucous membranes are moist.  Eyes:     Conjunctiva/sclera: Conjunctivae normal.  Cardiovascular:     Rate and Rhythm: Regular rhythm. Tachycardia present.     Heart sounds: No murmur heard. Pulmonary:     Effort: Pulmonary effort is normal. No respiratory distress.     Breath sounds: Normal breath sounds.  Abdominal:     Palpations: Abdomen is soft.     Tenderness: There is no abdominal tenderness. There is no right CVA tenderness or left CVA tenderness.  Musculoskeletal:     Cervical back: Neck supple.  Skin:    General: Skin is warm and dry.     Capillary Refill: Capillary refill takes less than 2 seconds.  Neurological:     Mental Status: He is alert and oriented to person, place, and time.     Motor: Tremor present.  Psychiatric:        Mood and Affect: Mood normal.     ____________________________________________   LABS (all labs ordered are listed, but only abnormal results are displayed)  Labs Reviewed  CBC - Abnormal; Notable for the following components:      Result Value   RBC 3.13 (*)    Hemoglobin 11.6 (*)    HCT 34.4 (*)    MCV 109.9 (*)    MCH 37.1 (*)    RDW 18.5 (*)    All other components within normal limits  BASIC METABOLIC PANEL - Abnormal; Notable for the following components:   CO2 21 (*)    Glucose, Bld 101 (*)    BUN  7 (*)    Calcium 8.3 (*)    All other components within normal limits  URINALYSIS, ROUTINE W REFLEX MICROSCOPIC   ____________________________________________  EKG  ____________________________________________  RADIOLOGY  ED MD interpretation: CT abdomen pelvis shows no evidence of kidney stone, perinephric stranding or other clear acute process.  There is evidence of aortic atherosclerosis and previously noted pancreatic tail density likely affecting a pseudocyst as well as hepatic steatosis and some mild gallbladder thickening secondary to partial distention.  No kidney stones or hydronephrosis are noted.  Official radiology report(s): CT Renal Stone Study  Result Date: 02/16/2021 CLINICAL DATA:  Flank pain, kidney stone suspected; hematuria, unknown cause EXAM: CT ABDOMEN AND PELVIS WITHOUT CONTRAST TECHNIQUE: Multidetector CT imaging of the abdomen and pelvis was performed following the standard protocol without IV contrast. COMPARISON:  12/18/2020 FINDINGS: Lower chest: No acute abnormality.  Bibasilar scarring. Hepatobiliary: Hepatic steatosis. Increased density within the gallbladder accentuated by low-density of surrounding liver may reflect sludge. No biliary dilatation. Pancreas: Further decrease in size of pancreatic tail low-density lesion now measuring 2.5 cm (previously 3.3 cm). Otherwise unremarkable. Spleen: Unremarkable. Adrenals/Urinary Tract: Adrenals are unremarkable. Renal cysts are again noted. Bladder is partially distended with mild circumferential wall thickening. Stomach/Bowel: Stomach is within normal limits. Bowel is normal in caliber. Normal appendix. Vascular/Lymphatic: Aortic atherosclerosis. No enlarged lymph nodes. Reproductive: Unremarkable prostate. Other: No free fluid.  No abdominal wall hernia. Musculoskeletal: Advanced degenerative changes of the included spine. IMPRESSION: No acute abnormality. Mild bladder wall thickening likely secondary to partial  distension. No urinary tract calculi or hydronephrosis. Hepatic steatosis. 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 Aortic atherosclerosis. Electronically Signed   By: Macy Mis M.D.   On: 02/16/2021 14:18    ____________________________________________   PROCEDURES  Procedure(s) performed (including Critical Care):  .1-3 Lead EKG Interpretation Performed by: [name], MD Authorized by: [name], MD     Interpretation: non-specific     ECG rate assessment: tachycardic     Rhythm: sinus tachycardia     Ectopy: none     Conduction: normal     ____________________________________________   INITIAL IMPRESSION / ASSESSMENT AND PLAN / ED COURSE      Patient presents with above-stated history exam with concerns that he was retaining urine earlier today.  On arrival he is tachycardic at 132 with otherwise stable vital signs on room air.  He states he initially had some abdominal lower abdominal discomfort but this is since resolved and he was able to make a small amount of urine while in the waiting room.  Patient did have a bladder scan done in the ED triage area that showed approximately 65 cc of urine.  There is unclear if patient urinated before or after this.  CT abdomen pelvis shows no evidence of kidney stone, perinephric stranding or other clear acute process.  There is evidence of aortic atherosclerosis and previously noted pancreatic tail density likely affecting a pseudocyst as well as hepatic steatosis and some mild gallbladder thickening secondary to partial distention.  No kidney stones or hydronephrosis are noted.  Will follow this up with a UA to see if there is evidence of cystitis.  CBC shows no leukocytosis or acute anemia.  BMP shows no significant electrolyte or metabolic derangements or acute kidney injury.  Repeat bladder scan somewhat indeterminate.  I will plan to hydrate with some IV fluids  and if he is unable to void at that time placed a Foley catheter to obtain a urine sample.  Suspect his tachycardia is likely secondary to his alcohol withdrawal given he reports he is not anything to drink alcohol last today and is also somewhat tremulous.  I will place him on CIWA pending remainder of work-up.  At this time I do not believe he is significantly dehydrated or septic.   Care patient signed over to assuming provider approximately 1500.  Plan is to follow-up UA and reassess after patient has received some IV fluids.      ____________________________________________   FINAL CLINICAL IMPRESSION(S) / ED DIAGNOSES  Final diagnoses:  Difficulty urinating  Alcohol withdrawal syndrome without complication (HCC)    Medications  LORazepam (ATIVAN) injection 0-4 mg (has no administration in time range)    Or  LORazepam (ATIVAN) tablet 0-4 mg (has no administration in time range)  LORazepam (ATIVAN) injection 0-4 mg (has no administration in time range)    Or  LORazepam (ATIVAN) tablet 0-4 mg (has no administration in time range)  thiamine tablet 100 mg (has no administration in time range)    Or  thiamine (B-1) injection 100 mg (has no administration in time range)  lactated ringers bolus 1,000 mL (1,000 mLs Intravenous New Bag/Given 02/16/21 1505)     ED Discharge Orders     None        Note:  This document was prepared using Dragon voice recognition software and may include unintentional dictation errors.    [name], MD 02/16/21 [phone]

## 2021-02-16 NOTE — ED Notes (Signed)
Says he only voided a little bit.  Bladder [place] an 24 ml/53ml.  He is shakey.  Says he thinks it is from drinking.  Last drink was last night.

## 2021-02-16 NOTE — ED Provider Notes (Signed)
Patient received in signout from Dr. Hulan Saas pending voiding trial and urinalysis.  Patient is able to void a small amount, and postvoid residual is about 150 cc, without evidence of significant retention to necessitate indwelling Foley catheter.  UA shows stigmata of acute cystitis.  No evidence of sepsis or pyelonephritis.  He is eager to go home and I see no barriers to outpatient management at this time.  Provided a single dose of Rocephin due to his inability to go to the pharmacy until tomorrow afternoon, and he will be discharged with a 10-day course of Keflex.  Return precautions discussed.  Clinical Course as of 02/16/21 1813  Mon Feb 16, 2021  1600 Introduced myself to the patient, got signout on him nearly an hour ago. Fluids nearly finished and he does report the urge to void. We discuss voiding trial and bladder scan [DS]  6010 Voided small amount for UA. I bladder scan afterwards, 3 times, get about 150cc each time [DS]  9323 Reassessed.  Discussed with the patient diagnosis of UTI.  We discussed his comfort level going home and he requests discharge.  We discussed antibiotics at home and return precautions for the ED. [DS]    Clinical Course User Index [DS] Vladimir Crofts, MD      Vladimir Crofts, MD 02/16/21 [phone]

## 2021-02-16 NOTE — ED Notes (Signed)
Dr Tamala Julian bladder scanned pt, 150 cc urine residual after pt voided   ua to lab.

## 2021-02-16 NOTE — ED Notes (Signed)
D/c inst to pt  pt alert. Iv dc'ed.

## 2021-02-16 NOTE — ED Triage Notes (Addendum)
Pt come with c/o urinary retention. Pt states he hasn't been able to void today. Pt states little lower abdominal pain. Pt states he just woke up today and this started.  Bladder scan 11mL at this time.

## 2021-02-16 NOTE — ED Notes (Signed)
Up to toilet as he had to have small bm.  He was uinable to void.  He is very shakey when he ambulates

## 2021-02-16 NOTE — Discharge Instructions (Signed)
I suspect your difficulty urinating is related to a bladder infection/UTI.  You are being discharged with 10 days of antibiotics.  Please take this Keflex antibiotic 4 times daily for the next 10 days, and finish all 40 pills even if your symptoms are better.  We gave you a dose of IV antibiotic in the ED. pick up your antibiotics tomorrow and start taking tomorrow afternoon (Tuesday).  I would take 2 doses on Tuesday, then start taking 4 times daily on Wednesday.  Return to the ED with any fevers or worsening symptoms despite these medications.

## 2021-02-16 NOTE — ED Triage Notes (Signed)
First Nurse Note:  Arrives from home via [facility] with c/o urinary retention.  Per EMS, c/o difficulty urinating this morning.  VS wnl.

## 2021-02-17 ENCOUNTER — Ambulatory Visit: Payer: Medicare Other | Admitting: Physician Assistant

## 2021-02-18 ENCOUNTER — Other Ambulatory Visit: Payer: Self-pay

## 2021-02-18 ENCOUNTER — Encounter: Payer: Self-pay | Admitting: Emergency Medicine

## 2021-02-18 ENCOUNTER — Observation Stay
Admission: EM | Admit: 2021-02-18 | Discharge: 2021-02-19 | Disposition: A | Discharge status: Home / Self Care | Payer: Medicare Other | Attending: Internal Medicine | Admitting: Internal Medicine

## 2021-02-18 ENCOUNTER — Emergency Department: Payer: Medicare Other

## 2021-02-18 DIAGNOSIS — E860 Dehydration: Secondary | ICD-10-CM | POA: Diagnosis present

## 2021-02-18 DIAGNOSIS — D696 Thrombocytopenia, unspecified: Secondary | ICD-10-CM | POA: Diagnosis present

## 2021-02-18 DIAGNOSIS — R55 Syncope and collapse: Secondary | ICD-10-CM | POA: Diagnosis not present

## 2021-02-18 DIAGNOSIS — N390 Urinary tract infection, site not specified: Secondary | ICD-10-CM

## 2021-02-18 DIAGNOSIS — E871 Hypo-osmolality and hyponatremia: Secondary | ICD-10-CM

## 2021-02-18 DIAGNOSIS — F17210 Nicotine dependence, cigarettes, uncomplicated: Secondary | ICD-10-CM | POA: Insufficient documentation

## 2021-02-18 DIAGNOSIS — A4150 Gram-negative sepsis, unspecified: Secondary | ICD-10-CM

## 2021-02-18 DIAGNOSIS — R296 Repeated falls: Secondary | ICD-10-CM | POA: Diagnosis not present

## 2021-02-18 DIAGNOSIS — F10930 Alcohol use, unspecified with withdrawal, uncomplicated: Secondary | ICD-10-CM | POA: Diagnosis not present

## 2021-02-18 DIAGNOSIS — F1010 Alcohol abuse, uncomplicated: Secondary | ICD-10-CM | POA: Diagnosis present

## 2021-02-18 DIAGNOSIS — E876 Hypokalemia: Secondary | ICD-10-CM | POA: Diagnosis not present

## 2021-02-18 DIAGNOSIS — Z79899 Other long term (current) drug therapy: Secondary | ICD-10-CM | POA: Diagnosis not present

## 2021-02-18 DIAGNOSIS — K3189 Other diseases of stomach and duodenum: Secondary | ICD-10-CM | POA: Diagnosis present

## 2021-02-18 DIAGNOSIS — R42 Dizziness and giddiness: Secondary | ICD-10-CM

## 2021-02-18 DIAGNOSIS — E440 Moderate protein-calorie malnutrition: Secondary | ICD-10-CM | POA: Diagnosis present

## 2021-02-18 LAB — CBC
HCT: 30.7 % — ABNORMAL LOW (ref 39.0–52.0)
Hemoglobin: 10.9 g/dL — ABNORMAL LOW (ref 13.0–17.0)
MCH: 37.5 pg — ABNORMAL HIGH (ref 26.0–34.0)
MCHC: 35.5 g/dL (ref 30.0–36.0)
MCV: 105.5 fL — ABNORMAL HIGH (ref 80.0–100.0)
Platelets: 120 10*3/uL — ABNORMAL LOW (ref 150–400)
RBC: 2.91 MIL/uL — ABNORMAL LOW (ref 4.22–5.81)
RDW: 17.2 % — ABNORMAL HIGH (ref 11.5–15.5)
WBC: 13.2 10*3/uL — ABNORMAL HIGH (ref 4.0–10.5)
nRBC: 0 % (ref 0.0–0.2)

## 2021-02-18 LAB — URINALYSIS, ROUTINE W REFLEX MICROSCOPIC
Bilirubin Urine: NEGATIVE
Glucose, UA: NEGATIVE mg/dL
Ketones, ur: NEGATIVE mg/dL
Nitrite: NEGATIVE
Protein, ur: NEGATIVE mg/dL
Specific Gravity, Urine: 1.004 — ABNORMAL LOW (ref 1.005–1.030)
Squamous Epithelial / HPF: NONE SEEN (ref 0–5)
pH: 7 (ref 5.0–8.0)

## 2021-02-18 LAB — TROPONIN I (HIGH SENSITIVITY): Troponin I (High Sensitivity): 13 ng/L (ref <18)

## 2021-02-18 LAB — BASIC METABOLIC PANEL
Anion gap: 10 (ref 5–15)
BUN: 7 mg/dL — ABNORMAL LOW (ref 8–23)
CO2: 24 mmol/L (ref 22–32)
Calcium: 7.8 mg/dL — ABNORMAL LOW (ref 8.9–10.3)
Chloride: 92 mmol/L — ABNORMAL LOW (ref 98–111)
Creatinine, Ser: 0.73 mg/dL (ref 0.61–1.24)
GFR, Estimated: >60 mL/min (ref >60)
Glucose, Bld: 116 mg/dL — ABNORMAL HIGH (ref 70–99)
Potassium: 2.4 mmol/L — CL (ref 3.5–5.1)
Sodium: 126 mmol/L — ABNORMAL LOW (ref 135–145)

## 2021-02-18 LAB — CBG MONITORING, ED: Glucose-Capillary: 117 mg/dL — ABNORMAL HIGH (ref 70–99)

## 2021-02-18 MED ORDER — POTASSIUM CHLORIDE CRYS ER 20 MEQ PO TBCR
40.0000 meq | EXTENDED_RELEASE_TABLET | Freq: Once | ORAL | Status: AC
  Administered 2021-02-18: 40 meq via ORAL
  Filled 2021-02-18: qty 2

## 2021-02-18 MED ORDER — LORAZEPAM 2 MG PO TABS: 0.0000 mg | ORAL_TABLET | Freq: Two times a day (BID) | ORAL | Status: DC

## 2021-02-18 MED ORDER — ENOXAPARIN SODIUM 40 MG/0.4ML IJ SOSY
40.0000 mg | PREFILLED_SYRINGE | INTRAMUSCULAR | Status: DC
  Administered 2021-02-19: 09:00:00 40 mg via SUBCUTANEOUS
  Filled 2021-02-18: qty 0.4

## 2021-02-18 MED ORDER — POTASSIUM CHLORIDE IN NACL 40-0.9 MEQ/L-% IV SOLN
INTRAVENOUS | Status: DC
  Filled 2021-02-18: qty 1000

## 2021-02-18 MED ORDER — ACETAMINOPHEN 325 MG RE SUPP: 650.0000 mg | Freq: Four times a day (QID) | RECTAL | Status: DC | PRN

## 2021-02-18 MED ORDER — SODIUM CHLORIDE 0.9 % IV SOLN: Freq: Once | INTRAVENOUS | Status: DC

## 2021-02-18 MED ORDER — ACETAMINOPHEN 325 MG PO TABS: 650.0000 mg | ORAL_TABLET | Freq: Four times a day (QID) | ORAL | Status: DC | PRN

## 2021-02-18 MED ORDER — SODIUM CHLORIDE 0.9 % IV SOLN
1.0000 g | Freq: Once | INTRAVENOUS | Status: AC
  Administered 2021-02-19: 1 g via INTRAVENOUS
  Filled 2021-02-18: qty 10

## 2021-02-18 MED ORDER — LORAZEPAM 2 MG/ML IJ SOLN: 0.0000 mg | Freq: Four times a day (QID) | INTRAMUSCULAR | Status: DC

## 2021-02-18 MED ORDER — LORAZEPAM 2 MG PO TABS
0.0000 mg | ORAL_TABLET | Freq: Four times a day (QID) | ORAL | Status: DC
  Filled 2021-02-18: qty 1

## 2021-02-18 MED ORDER — ONDANSETRON HCL 4 MG PO TABS: 4.0000 mg | ORAL_TABLET | Freq: Four times a day (QID) | ORAL | Status: DC | PRN

## 2021-02-18 MED ORDER — PANTOPRAZOLE SODIUM 40 MG PO TBEC
40.0000 mg | DELAYED_RELEASE_TABLET | Freq: Every day | ORAL | Status: DC
  Administered 2021-02-19: 09:00:00 40 mg via ORAL
  Filled 2021-02-18: qty 1

## 2021-02-18 MED ORDER — MAGNESIUM HYDROXIDE 400 MG/5ML PO SUSP: 30.0000 mL | Freq: Every day | ORAL | Status: DC | PRN

## 2021-02-18 MED ORDER — ALLOPURINOL 100 MG PO TABS
100.0000 mg | ORAL_TABLET | Freq: Every day | ORAL | Status: DC
  Filled 2021-02-18: qty 1

## 2021-02-18 MED ORDER — IPRATROPIUM-ALBUTEROL 0.5-2.5 (3) MG/3ML IN SOLN: 3.0000 mL | Freq: Four times a day (QID) | RESPIRATORY_TRACT | Status: DC | PRN

## 2021-02-18 MED ORDER — MAGNESIUM SULFATE 2 GM/50ML IV SOLN
2.0000 g | Freq: Once | INTRAVENOUS | Status: AC
  Administered 2021-02-18: 2 g via INTRAVENOUS
  Filled 2021-02-18: qty 50

## 2021-02-18 MED ORDER — THIAMINE HCL 100 MG PO TABS
100.0000 mg | ORAL_TABLET | Freq: Every day | ORAL | Status: DC
  Administered 2021-02-18 – 2021-02-19 (×2): 100 mg via ORAL
  Filled 2021-02-18 (×2): qty 1

## 2021-02-18 MED ORDER — ONDANSETRON HCL 4 MG/2ML IJ SOLN: 4.0000 mg | Freq: Four times a day (QID) | INTRAMUSCULAR | Status: DC | PRN

## 2021-02-18 MED ORDER — THIAMINE HCL 100 MG/ML IJ SOLN: 100.0000 mg | Freq: Every day | INTRAMUSCULAR | Status: DC

## 2021-02-18 MED ORDER — SODIUM CHLORIDE 0.9 % IV BOLUS
1000.0000 mL | Freq: Once | INTRAVENOUS | Status: AC
  Administered 2021-02-18: 1000 mL via INTRAVENOUS

## 2021-02-18 MED ORDER — LORAZEPAM 2 MG/ML IJ SOLN: 0.0000 mg | Freq: Two times a day (BID) | INTRAMUSCULAR | Status: DC

## 2021-02-18 MED ORDER — CEPHALEXIN 500 MG PO CAPS: 500.0000 mg | ORAL_CAPSULE | Freq: Four times a day (QID) | ORAL | Status: DC

## 2021-02-18 MED ORDER — TRAZODONE HCL 50 MG PO TABS: 25.0000 mg | ORAL_TABLET | Freq: Every evening | ORAL | Status: DC | PRN

## 2021-02-18 MED ORDER — TAMSULOSIN HCL 0.4 MG PO CAPS
0.4000 mg | ORAL_CAPSULE | Freq: Every day | ORAL | Status: DC
  Administered 2021-02-19: 09:00:00 0.4 mg via ORAL
  Filled 2021-02-18: qty 1

## 2021-02-18 MED ORDER — LORAZEPAM 2 MG PO TABS
2.0000 mg | ORAL_TABLET | Freq: Once | ORAL | Status: AC
  Administered 2021-02-18: 2 mg via ORAL
  Filled 2021-02-18: qty 1

## 2021-02-18 MED ORDER — POTASSIUM CHLORIDE 10 MEQ/100ML IV SOLN
10.0000 meq | INTRAVENOUS | Status: DC
  Administered 2021-02-18: 10 meq via INTRAVENOUS
  Filled 2021-02-18 (×2): qty 100

## 2021-02-18 NOTE — ED Triage Notes (Signed)
Pt comes into the ED via EMS from home , [place] in [place] in [place], pt c/o dizziness for the past 6 days, quit drinking liquor 6 days ago was a daily drinking  104/60 128HR [id number] [id number]

## 2021-02-18 NOTE — H&P (Addendum)
[place]   PATIENT NAME: Fernando [name]    MR#:  [id number]  DATE OF BIRTH:  December 02, 1955  DATE OF ADMISSION:  02/18/2021  PRIMARY CARE PHYSICIAN: [name], MD   Patient is coming from: Home  REQUESTING/REFERRING PHYSICIAN: Merlyn Lot, MD  CHIEF COMPLAINT:   Chief Complaint  Patient presents with  . Dizziness  . Withdrawal    HISTORY OF PRESENT ILLNESS:  Fernando [name] is a 65 y.o. Caucasian male with medical history significant for Diastolic CHF, alcohol use disorder with history of DTs, alcoholic liver cirrhosis, hypertension and hospitalization from 9/15-9/21 with upper GI bleed with EGD showing esophagitis and nonbleeding gastric ulcers, as well as another hospitalization last month 10/17-10/31 for acute subdural hematoma secondary to fall and thrombocytopenia and coagulopathy, hypokalemia and hypomagnesemia with alcohol intoxication, who presents today to the ER with acute onset of recurrent falls and clotting twice with associated near syncope today while he was going to the bathroom using his walker.  His last alcoholic drink was 6 days ago.  No paresthesias or focal muscle weakness.  He denied losing consciousness but felt weak all over.  No chest pain or palpitations.  No nausea or vomiting or abdominal pain but he admitted to diarrhea earlier this week.  No cough or wheezing or hemoptysis.  No bleeding diathesis.  ED Course: Upon presenting to the ER blood pressure was 134/112 with a heart rate of 136 and otherwise normal vital signs.  Labs revealed potassium of 2.4 and hyponatremia 126 with hypochloremia of 92 with leukocytosis of 13.2 and anemia with hemoglobin of 10.9 hematocrit 30.7 with platelets of 120.  UA was positive for UTI.    Imaging: Portable chest ray showed stable changes of chronic bibasal interstitial lung disease with no acute cardiopulmonary disease.  The patient was given 1 L bolus of IV normal saline followed by 75 mill per hour, 2 mg  of IV Ativan, 40 mill equivalent p.o. potassium chloride and was started on CIWA protocol for alcohol withdrawal. PAST MEDICAL HISTORY:   Past Medical History:  Diagnosis Date  . Alcohol dependence ([place])   . Arthritis   . Cataract   . Chronic systolic (congestive) heart failure (HCC)    EF 25-30% in 10/2019  . Dyslipidemia   . Essential hypertension   . History of kidney stones   . Hyperlipidemia   . Hypertension   . Pneumonia   . Prosthetic shoulder infection, initial encounter ([place])     PAST SURGICAL HISTORY:   Past Surgical History:  Procedure Laterality Date  . BIOPSY  04/21/2020   Procedure: BIOPSY;  Surgeon: Rush Landmark Telford Nab., MD;  Location: [facility];  Service: Gastroenterology;;  . BIOPSY  08/17/2020   Procedure: BIOPSY;  Surgeon: Carol Ada, MD;  Location: [facility];  Service: Endoscopy;;  . COLONOSCOPY    . COLONOSCOPY     multiple  . ESOPHAGOGASTRODUODENOSCOPY N/A 12/20/2020   Procedure: ESOPHAGOGASTRODUODENOSCOPY (EGD);  Surgeon: Virgel Manifold, MD;  Location: [facility];  Service: Endoscopy;  Laterality: N/A;  . ESOPHAGOGASTRODUODENOSCOPY (EGD) WITH PROPOFOL N/A 01/31/2020   Procedure: ESOPHAGOGASTRODUODENOSCOPY (EGD) WITH PROPOFOL;  Surgeon: Doran Stabler, MD;  Location: [place];  Service: Gastroenterology;  Laterality: N/A;  . ESOPHAGOGASTRODUODENOSCOPY (EGD) WITH PROPOFOL N/A 04/21/2020   Procedure: ESOPHAGOGASTRODUODENOSCOPY (EGD) WITH PROPOFOL;  Surgeon: Rush Landmark Telford Nab., MD;  Location: [place];  Service: Gastroenterology;  Laterality: N/A;  . ESOPHAGOGASTRODUODENOSCOPY (EGD) WITH PROPOFOL N/A 08/17/2020   Procedure: ESOPHAGOGASTRODUODENOSCOPY (EGD)  WITH PROPOFOL;  Surgeon: Carol Ada, MD;  Location: [place];  Service: Endoscopy;  Laterality: N/A;  . ESOPHAGOGASTRODUODENOSCOPY (EGD) WITH PROPOFOL N/A 01/26/2021   Procedure: ESOPHAGOGASTRODUODENOSCOPY (EGD) WITH PROPOFOL;  Surgeon: [name], DO;   Location: [facility];  Service: Gastroenterology;  Laterality: N/A;  . EUS N/A 04/21/2020   Procedure: UPPER ENDOSCOPIC ULTRASOUND (EUS) LINEAR;  Surgeon: Irving Copas., MD;  Location: [place];  Service: Gastroenterology;  Laterality: N/A;  . HERNIA REPAIR  1990  . KNEE SURGERY  1975   left men. repair  . KNEE SURGERY  1975  . REVERSE SHOULDER ARTHROPLASTY Right 06/21/2018   Procedure: REVERSE SHOULDER ARTHROPLASTY;  Surgeon: Hiram Gash, MD;  Location: [facility];  Service: Orthopedics;  Laterality: Right;  . ROTATOR CUFF REPAIR     right  . ROTATOR CUFF REPAIR    . TONSILLECTOMY    . TOTAL SHOULDER REPLACEMENT  2020  . UMBILICAL HERNIA REPAIR      SOCIAL HISTORY:   Social History   Tobacco Use  . Smoking status: Every Day    Packs/day: 0.50    Years: 50.00    Pack years: 25.00    Types: Cigarettes  . Smokeless tobacco: Never  Substance Use Topics  . Alcohol use: Yes    Alcohol/week: 14.0 standard drinks    Types: 14 Shots of liquor per week    FAMILY HISTORY:   Family History  Problem Relation Age of Onset  . Heart failure Mother   . Lung cancer Father   . Colon cancer Neg Hx   . Esophageal cancer Neg Hx   . Stomach cancer Neg Hx   . Liver cancer Neg Hx   . Pancreatic cancer Neg Hx   . Rectal cancer Neg Hx   . Inflammatory bowel disease Neg Hx     DRUG ALLERGIES:   Allergies  Allergen Reactions  . Atorvastatin     Other reaction(s): joints ache  . Doxycycline     Other reaction(s): rash/hive    REVIEW OF SYSTEMS:   ROS As per history of present illness. All pertinent systems were reviewed above. Constitutional, HEENT, cardiovascular, respiratory, GI, GU, musculoskeletal, neuro, psychiatric, endocrine, integumentary and hematologic systems were reviewed and are otherwise negative/unremarkable except for positive findings mentioned above in the HPI.   MEDICATIONS AT HOME:   Prior to Admission medications   Medication Sig Start Date  End Date Taking? Authorizing Provider  allopurinol (ZYLOPRIM) 100 MG tablet Take 100 mg by mouth daily. 08/09/18   [provider]  cephALEXin (KEFLEX) 500 MG capsule Take 1 capsule (500 mg total) by mouth 4 (four) times daily for 10 days. 02/16/21 02/26/21  Vladimir Crofts, MD  ipratropium-albuterol (DUONEB) 0.5-2.5 (3) MG/3ML SOLN Take 3 mLs by nebulization every 6 (six) hours as needed. 01/27/21   Sharen Hones, MD  pantoprazole (PROTONIX) 40 MG tablet Take 40 mg by mouth daily. 02/05/21   [provider]  tamsulosin (FLOMAX) 0.4 MG CAPS capsule Take 1 capsule (0.4 mg total) by mouth daily. 02/02/21   Max Sane, MD      VITAL SIGNS:  Blood pressure 121/73, pulse (!) 108, temperature 98.6 F (37 C), temperature source Oral, resp. rate 19, height 5\' 5"  (1.651 m), weight 68 kg, SpO2 96 %.  PHYSICAL EXAMINATION:  Physical Exam  GENERAL:  65 y.o.-year-old patient lying in the bed with no acute distress.  He was fairly somnolent but arousable. EYES: Pupils equal, round, reactive to light and accommodation.  No scleral icterus. Extraocular muscles intact.  HEENT: Head atraumatic, normocephalic. Oropharynx and nasopharynx clear.  NECK:  Supple, no jugular venous distention. No thyroid enlargement, no tenderness.  LUNGS: Normal breath sounds bilaterally, no wheezing, rales,rhonchi or crepitation. No use of accessory muscles of respiration.  CARDIOVASCULAR: Regular rate and rhythm, S1, S2 normal. No murmurs, rubs, or gallops.  ABDOMEN: Soft, nondistended, nontender. Bowel sounds present. No organomegaly or mass.  EXTREMITIES: No pedal edema, cyanosis, or clubbing.  NEUROLOGIC: Cranial nerves II through XII are intact. Muscle strength 5/5 in all extremities. Sensation intact. Gait not checked.  PSYCHIATRIC: The patient is alert and oriented x 3.  Normal affect and good eye contact. SKIN: No obvious rash, lesion, or ulcer.   LABORATORY PANEL:   CBC Recent Labs  Lab 02/18/21 1732   WBC 13.2*  HGB 10.9*  HCT 30.7*  PLT 120*   ------------------------------------------------------------------------------------------------------------------  Chemistries  Recent Labs  Lab 02/18/21 1732  NA 126*  K 2.4*  CL 92*  CO2 24  GLUCOSE 116*  BUN 7*  CREATININE 0.73  CALCIUM 7.8*   ------------------------------------------------------------------------------------------------------------------  Cardiac Enzymes No results for input(s): TROPONINI in the last 168 hours. ------------------------------------------------------------------------------------------------------------------  RADIOLOGY:  CT HEAD WO CONTRAST (5MM)  Result Date: 02/18/2021 CLINICAL DATA:  Head trauma, minor (Age >= 65y).  Fall. EXAM: CT HEAD WITHOUT CONTRAST TECHNIQUE: Contiguous axial images were obtained from the base of the skull through the vertex without intravenous contrast. COMPARISON:  01/19/2021 FINDINGS: Brain: There is atrophy and chronic small vessel disease changes. No acute intracranial abnormality. Specifically, no hemorrhage, hydrocephalus, mass lesion, acute infarction, or significant intracranial injury. Vascular: No hyperdense vessel or unexpected calcification. Skull: No acute calvarial abnormality. Sinuses/Orbits: No acute findings. Mucosal thickening in the maxillary sinuses. Other: None IMPRESSION: Atrophy, chronic microvascular disease. No acute intracranial abnormality. Electronically Signed   By: Rolm Baptise M.D.   On: 02/18/2021 21:39   DG Chest Portable 1 View  Result Date: 02/18/2021 CLINICAL DATA:  Syncope, congestive heart failure EXAM: PORTABLE CHEST 1 VIEW COMPARISON:  01/19/2021 FINDINGS: Chronic interstitial opacities are seen in the lung bases bilaterally, stable since prior examination and in keeping with underlying interstitial lung disease. No superimposed focal pulmonary infiltrate. Pulmonary insufflation is normal and symmetric. No pneumothorax or pleural  effusion. Cardiac size within normal limits. Pulmonary vascularity is normal. No acute bone abnormality. Right total shoulder arthroplasty has been performed. IMPRESSION: Stable changes of chronic bibasilar interstitial lung disease. No radiographic evidence of acute cardiopulmonary disease. Electronically Signed   By: Fidela Salisbury M.D.   On: 02/18/2021 20:53      IMPRESSION AND PLAN:  Active Problems:   Hyponatremia  1.  Presyncope with recurrent falls. - The patient will be admitted to the medical monitored bed. - We will check orthostatics every 12 hours. - He will be hydrated with IV normal saline. - PT evaluation will be obtained. - We will manage his hyponatremia.  2.  Hyponatremia. - This is likely secondary to alcohol potomania. - Hyponatremia work-up will be obtained. - We will sodium levels.  3.  Hypokalemia. - Potassium will be replaced and magnesium level will be checked.  4.  UTI with subsequent sepsis as manifested by tachycardia, tachypnea as well as leukocytosis.  This could be contributing to recurrent falls. - The patient will be placed on IV Rocephin and will follow urine and blood cultures.  5.  Alcohol withdrawal. - We will continue him on CIWA protocol.  6.  GERD. - PPI  therapy will be resumed.  7. Gout. - Allopurinol be continued.  8.  BPH. - We will continue Flomax.  9.  Chronic diastolic CHF. - No current exacerbation.   DVT prophylaxis: Lovenox. Code Status: full code. Family Communication:  The plan of care was discussed in details with the patient (and family). I answered all questions. The patient agreed to proceed with the above mentioned plan. Further management will depend upon hospital course. Disposition Plan: Back to previous home environment Consults called: none. All the records are reviewed and case discussed with ED provider.  Status is: Inpatient   Remains inpatient appropriate because:Ongoing diagnostic testing needed not  appropriate for outpatient work up, Unsafe d/c plan, IV treatments appropriate due to intensity of illness or inability to take PO, and Inpatient level of care appropriate due to severity of illness   Dispo: The patient is from: Home              Anticipated d/c is to: Home              Patient currently is not medically stable to d/c.              Difficult to place patient: No  TOTAL TIME TAKING CARE OF THIS PATIENT: 55 minutes.     Christel Mormon M.D on 02/18/2021 at 11:42 PM  [facility]   From 7 PM-7 AM, contact night-coverage [email or web address]  CC: Primary care physician; [name], MD

## 2021-02-18 NOTE — ED Triage Notes (Signed)
Pt comes into the ED via [facility] from home c/o dizziness that has been ongoing x 6 days.  PT also admits he stopped drinking ETOH 6 days ago.  Pt admits he normally drinks half a bottle of bourbon a day at baseline.  Pt admits he fell today while getting off the toilet.  Pt denies any pain from the fall and denies hitting his head.  Pt denies any nausea, paraesthesias, etc.  Pt's only complaints are dizziness and weakness.

## 2021-02-18 NOTE — ED Provider Notes (Signed)
[facility] Emergency Department Provider Note    Event Date/Time   First MD Initiated Contact with Patient 02/18/21 2030     (approximate)  I have reviewed the triage vital signs and the nursing notes.   HISTORY  Chief Complaint Dizziness and Withdrawal    HPI Fernando [name] is a 65 y.o. male with a history of alcohol dependence presents to the ER for evaluation of 2 falls.  States the first 1 happened 6 days ago after he had his last drink of alcohol in 6 days.  States he is actually had a couple episodes but the more severe ones happened today.  Does not feel like he fully lost consciousness just felt like his entire body went weak.  He denies any chest pain or pressure no shortness of breath.  Does feel shaky.  Past Medical History:  Diagnosis Date   Alcohol dependence ([place])    Arthritis    Cataract    Chronic systolic (congestive) heart failure (HCC)    EF 25-30% in 10/2019   Dyslipidemia    Essential hypertension    History of kidney stones    Hyperlipidemia    Hypertension    Pneumonia    Prosthetic shoulder infection, initial encounter ([place])    Family History  Problem Relation Age of Onset   Heart failure Mother    Lung cancer Father    Colon cancer Neg Hx    Esophageal cancer Neg Hx    Stomach cancer Neg Hx    Liver cancer Neg Hx    Pancreatic cancer Neg Hx    Rectal cancer Neg Hx    Inflammatory bowel disease Neg Hx    Past Surgical History:  Procedure Laterality Date   BIOPSY  04/21/2020   Procedure: BIOPSY;  Surgeon: Irving Copas., MD;  Location: [facility] ENDOSCOPY;  Service: Gastroenterology;;   BIOPSY  08/17/2020   Procedure: BIOPSY;  Surgeon: Carol Ada, MD;  Location: [facility] ENDOSCOPY;  Service: Endoscopy;;   COLONOSCOPY     COLONOSCOPY     multiple   ESOPHAGOGASTRODUODENOSCOPY N/A 12/20/2020   Procedure: ESOPHAGOGASTRODUODENOSCOPY (EGD);  Surgeon: Virgel Manifold, MD;  Location: [facility] ENDOSCOPY;  Service:  Endoscopy;  Laterality: N/A;   ESOPHAGOGASTRODUODENOSCOPY (EGD) WITH PROPOFOL N/A 01/31/2020   Procedure: ESOPHAGOGASTRODUODENOSCOPY (EGD) WITH PROPOFOL;  Surgeon: Doran Stabler, MD;  Location: [place];  Service: Gastroenterology;  Laterality: N/A;   ESOPHAGOGASTRODUODENOSCOPY (EGD) WITH PROPOFOL N/A 04/21/2020   Procedure: ESOPHAGOGASTRODUODENOSCOPY (EGD) WITH PROPOFOL;  Surgeon: Rush Landmark Telford Nab., MD;  Location: [place];  Service: Gastroenterology;  Laterality: N/A;   ESOPHAGOGASTRODUODENOSCOPY (EGD) WITH PROPOFOL N/A 08/17/2020   Procedure: ESOPHAGOGASTRODUODENOSCOPY (EGD) WITH PROPOFOL;  Surgeon: Carol Ada, MD;  Location: [place];  Service: Endoscopy;  Laterality: N/A;   ESOPHAGOGASTRODUODENOSCOPY (EGD) WITH PROPOFOL N/A 01/26/2021   Procedure: ESOPHAGOGASTRODUODENOSCOPY (EGD) WITH PROPOFOL;  Surgeon: [name], DO;  Location: [facility] ENDOSCOPY;  Service: Gastroenterology;  Laterality: N/A;   EUS N/A 04/21/2020   Procedure: UPPER ENDOSCOPIC ULTRASOUND (EUS) LINEAR;  Surgeon: Irving Copas., MD;  Location: [place];  Service: Gastroenterology;  Laterality: N/A;   [place]   left men. repair   KNEE SURGERY  1975   REVERSE SHOULDER ARTHROPLASTY Right 06/21/2018   Procedure: REVERSE SHOULDER ARTHROPLASTY;  Surgeon: Hiram Gash, MD;  Location: [facility];  Service: Orthopedics;  Laterality: Right;   ROTATOR CUFF REPAIR     right  ROTATOR CUFF REPAIR     TONSILLECTOMY     TOTAL SHOULDER REPLACEMENT  3500   UMBILICAL HERNIA REPAIR     Patient Active Problem List   Diagnosis Date Noted   Iron deficiency anemia due to chronic blood loss 01/29/2021   Acute blood loss anemia 01/29/2021   Acute urinary retention 01/29/2021   Scalp laceration    COVID-19 virus infection 01/27/2021   Hypokalemia 01/19/2021   Hypomagnesemia 01/19/2021   Laceration of forehead 01/19/2021   Acute subdural hematoma 01/19/2021    Hematoma of frontal scalp 01/19/2021   History of GI bleed 01/19/2021   Subdural hematoma 01/19/2021   Alcohol abuse    Elevated AST (SGOT)    Malnutrition of moderate degree 93/81/8299   Alcoholic ketoacidosis 37/16/9678   Chronic diastolic CHF (congestive heart failure) ([place]) 93/81/0175   Alcoholic cirrhosis of liver without ascites ([place])    Acute GI bleeding 08/16/2020   Abnormal CT of the abdomen 05/16/2020   Pancreatic disease 05/16/2020   Cyst of pancreas 05/16/2020   Acute on chronic combined systolic and diastolic CHF (congestive heart failure) ([place]) 02/05/2020   Anemia of chronic disease    Alcoholic liver disease ([place])    Portal hypertensive gastropathy (HCC)    Pancreatic mass    Sinus tachycardia    AKI (acute kidney injury) ([place]) 01/27/2020   Fall 01/26/2020   Hypotension 01/26/2020   Tobacco dependence 01/26/2020   Dislocation of prosthetic shoulder joint, initial encounter ([place])    Essential hypertension    Dyslipidemia    Alcohol use disorder, moderate, dependence ([place])    Pneumonitis    Generalized weakness 01/27/8526   Alcoholic hepatitis 78/24/2353   Sinus tachycardia 10/25/2019   Hyperammonemia ([place]) 10/25/2019   Delirium tremens ([place]) 10/25/2019   Acute hypoxemic respiratory failure ([place]) 10/25/2019   Macrocytic anemia 10/25/2019   Thrombocytopenia ([place]) 10/25/2019   Gout    Tobacco abuse    Stasis dermatitis of both legs 09/04/2018   Medication monitoring encounter 08/09/2018   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 Authorizing Provider  allopurinol (ZYLOPRIM) 100 MG tablet Take 100 mg by mouth daily. 08/09/18   [provider]  cephALEXin (KEFLEX) 500 MG capsule Take 1 capsule (500 mg total) by mouth 4 (four) times daily for 10 days. 02/16/21 02/26/21  Vladimir Crofts, MD  ipratropium-albuterol  (DUONEB) 0.5-2.5 (3) MG/3ML SOLN Take 3 mLs by nebulization every 6 (six) hours as needed. 01/27/21   Sharen Hones, MD  pantoprazole (PROTONIX) 40 MG tablet Take 40 mg by mouth daily. 02/05/21   [provider]  tamsulosin (FLOMAX) 0.4 MG CAPS capsule Take 1 capsule (0.4 mg total) by mouth daily. 02/02/21   Max Sane, MD    Allergies Atorvastatin and Doxycycline    Social History Social History   Tobacco Use   Smoking status: Every Day    Packs/day: 0.50    Years: 50.00    Pack years: 25.00    Types: Cigarettes   Smokeless tobacco: Never  Vaping Use   Vaping Use: Never used  Substance Use Topics   Alcohol use: Yes    Alcohol/week: 14.0 standard drinks    Types: 14 Shots of liquor per week   Drug use: Not Currently    Types: Marijuana    Review of Systems Patient denies headaches, rhinorrhea, blurry vision,  numbness, shortness of breath, chest pain, edema, cough, abdominal pain, nausea, vomiting, diarrhea, dysuria, fevers, rashes or hallucinations unless otherwise stated above in HPI. ____________________________________________   PHYSICAL EXAM:  VITAL SIGNS: Vitals:   02/18/21 2137 02/18/21 2137  BP: 121/73 121/73  Pulse: (!) 108 (!) 108  Resp: 19   Temp:    SpO2: 96%     Constitutional: Alert and oriented.  Eyes: Conjunctivae are normal.  Head: Atraumatic. Nose: No congestion/rhinnorhea. Mouth/Throat: Mucous membranes are moist.   Neck: No stridor. Painless ROM.  Cardiovascular: tachycardic rate, regular rhythm. Grossly normal heart sounds.  Good peripheral circulation. Respiratory: Normal respiratory effort.  No retractions. Lungs CTAB. Gastrointestinal: Soft and nontender. No distention. No abdominal bruits. No CVA tenderness. Genitourinary:  Musculoskeletal: No lower extremity tenderness nor edema.  No joint effusions. Neurologic:  Normal speech and language. No gross focal neurologic deficits are appreciated. No facial droop + tongue  fasciculations and bilat resting hand tremor Skin:  Skin is warm, dry and intact. No rash noted. Psychiatric: Mood and affect are normal. Speech and behavior are normal.  ____________________________________________   LABS (all labs ordered are listed, but only abnormal results are displayed)  Results for orders placed or performed during the hospital encounter of 02/18/21 (from the past 24 hour(s))  Basic metabolic panel     Status: Abnormal   Collection Time: 02/18/21  5:32 PM  Result Value Ref Range   Sodium 126 (L) 135 - 145 mmol/L   Potassium 2.4 (LL) 3.5 - 5.1 mmol/L   Chloride 92 (L) 98 - 111 mmol/L   CO2 24 22 - 32 mmol/L   Glucose, Bld 116 (H) 70 - 99 mg/dL   BUN 7 (L) 8 - 23 mg/dL   Creatinine, Ser 0.73 0.61 - 1.24 mg/dL   Calcium 7.8 (L) 8.9 - 10.3 mg/dL   GFR, Estimated >60 >60 mL/min   Anion gap 10 5 - 15  CBC     Status: Abnormal   Collection Time: 02/18/21  5:32 PM  Result Value Ref Range   WBC 13.2 (H) 4.0 - 10.5 K/uL   RBC 2.91 (L) 4.22 - 5.81 MIL/uL   Hemoglobin 10.9 (L) 13.0 - 17.0 g/dL   HCT 30.7 (L) 39.0 - 52.0 %   MCV 105.5 (H) 80.0 - 100.0 fL   MCH 37.5 (H) 26.0 - 34.0 pg   MCHC 35.5 30.0 - 36.0 g/dL   RDW 17.2 (H) 11.5 - 15.5 %   Platelets 120 (L) 150 - 400 K/uL   nRBC 0.0 0.0 - 0.2 %  CBG monitoring, ED     Status: Abnormal   Collection Time: 02/18/21  5:33 PM  Result Value Ref Range   Glucose-Capillary 117 (H) 70 - 99 mg/dL  Troponin I (High Sensitivity)     Status: None   Collection Time: 02/18/21  8:54 PM  Result Value Ref Range   Troponin I (High Sensitivity) 13 <18 ng/L   ____________________________________________  EKG My review and personal interpretation at Time: 20:43   Indication: tachycardia  Rate: 115  Rhythm: sinus Axis: normal Other: rbbb, no stemi, nonspecific st and t wave abn ____________________________________________  RADIOLOGY  I personally reviewed all radiographic images ordered to evaluate for the above acute  complaints and reviewed radiology reports and findings.  These findings were personally discussed with the patient.  Please see medical record for radiology report.  ____________________________________________   PROCEDURES  Procedure(s) performed:  Procedures    Critical Care performed: no ____________________________________________  INITIAL IMPRESSION / ASSESSMENT AND PLAN / ED COURSE  Pertinent labs & imaging results that were available during my care of the patient were reviewed by me and considered in my medical decision making (see chart for details).   DDX: dehydration, electrolyte abn, anemia, aki, withdrawal, dysrhythmia  Fernando [name] is a 65 y.o. who presents to the ED with symptoms as described above.  Patient somewhat tremulous but mentating appropriately.  Given these falls seizures on the differential but would expect him to be more tremulous and ill-appearing.  Having recurrent DTs.  Will give Ativan.  Blood work was sent for the above differential which shows evidence of hypokalemia as well as hyponatremia.  Patient does endorse poor p.o. intake but denies any abdominal pain.  Does have slightly rising white count.  Urine culture from 2 days ago grew out E faecalis.  Will give dose of IV Rocephin.  Will replete potassium with oral and IV potassium.  Will discuss with hospitalist for admission for electrolyte replacement.     The patient was evaluated in Emergency Department today for the symptoms described in the history of present illness. He/she was evaluated in the context of the global COVID-19 pandemic, which necessitated consideration that the patient might be at risk for infection with the SARS-CoV-2 virus that causes COVID-19. Institutional protocols and algorithms that pertain to the evaluation of patients at risk for COVID-19 are in a state of rapid change based on information released by regulatory bodies including the CDC and federal and state organizations.  These policies and algorithms were followed during the patient's care in the ED.  As part of my medical decision making, I reviewed the following data within the [place] notes reviewed and incorporated, Labs reviewed, notes from prior ED visits and [place] Controlled Substance Database   ____________________________________________   FINAL CLINICAL IMPRESSION(S) / ED DIAGNOSES  Final diagnoses:  Fainting spell  Alcohol withdrawal syndrome without complication ([place])  Hypokalemia  Hyponatremia      NEW MEDICATIONS STARTED DURING THIS VISIT:  New Prescriptions   No medications on file     Note:  This document was prepared using Dragon voice recognition software and may include unintentional dictation errors.    Merlyn Lot, MD 02/18/21 2251

## 2021-02-19 DIAGNOSIS — E871 Hypo-osmolality and hyponatremia: Secondary | ICD-10-CM | POA: Diagnosis not present

## 2021-02-19 DIAGNOSIS — R55 Syncope and collapse: Secondary | ICD-10-CM | POA: Diagnosis not present

## 2021-02-19 DIAGNOSIS — E860 Dehydration: Secondary | ICD-10-CM | POA: Diagnosis not present

## 2021-02-19 DIAGNOSIS — F1010 Alcohol abuse, uncomplicated: Secondary | ICD-10-CM

## 2021-02-19 LAB — TROPONIN I (HIGH SENSITIVITY): Troponin I (High Sensitivity): 14 ng/L (ref <18)

## 2021-02-19 LAB — CBC WITH DIFFERENTIAL/PLATELET
Abs Immature Granulocytes: 0.02 10*3/uL (ref 0.00–0.07)
Basophils Absolute: 0 10*3/uL (ref 0.0–0.1)
Basophils Relative: 0 %
Eosinophils Absolute: 0 10*3/uL (ref 0.0–0.5)
Eosinophils Relative: 0 %
HCT: 32.7 % — ABNORMAL LOW (ref 39.0–52.0)
Hemoglobin: 11.3 g/dL — ABNORMAL LOW (ref 13.0–17.0)
Immature Granulocytes: 0 %
Lymphocytes Relative: 27 %
Lymphs Abs: 1.6 10*3/uL (ref 0.7–4.0)
MCH: 36.9 pg — ABNORMAL HIGH (ref 26.0–34.0)
MCHC: 34.6 g/dL (ref 30.0–36.0)
MCV: 106.9 fL — ABNORMAL HIGH (ref 80.0–100.0)
Monocytes Absolute: 0.3 10*3/uL (ref 0.1–1.0)
Monocytes Relative: 4 %
Neutro Abs: 4 10*3/uL (ref 1.7–7.7)
Neutrophils Relative %: 69 %
Platelets: 99 10*3/uL — ABNORMAL LOW (ref 150–400)
RBC: 3.06 MIL/uL — ABNORMAL LOW (ref 4.22–5.81)
RDW: 17.3 % — ABNORMAL HIGH (ref 11.5–15.5)
WBC: 5.9 10*3/uL (ref 4.0–10.5)
nRBC: 0 % (ref 0.0–0.2)

## 2021-02-19 LAB — COMPREHENSIVE METABOLIC PANEL
ALT: 30 U/L (ref 0–44)
AST: 38 U/L (ref 15–41)
Albumin: 3.2 g/dL — ABNORMAL LOW (ref 3.5–5.0)
Alkaline Phosphatase: 134 U/L — ABNORMAL HIGH (ref 38–126)
Anion gap: 14 (ref 5–15)
BUN: 7 mg/dL — ABNORMAL LOW (ref 8–23)
CO2: 23 mmol/L (ref 22–32)
Calcium: 8 mg/dL — ABNORMAL LOW (ref 8.9–10.3)
Chloride: 94 mmol/L — ABNORMAL LOW (ref 98–111)
Creatinine, Ser: 0.68 mg/dL (ref 0.61–1.24)
GFR, Estimated: >60 mL/min (ref >60)
Glucose, Bld: 85 mg/dL (ref 70–99)
Potassium: 3.5 mmol/L (ref 3.5–5.1)
Sodium: 131 mmol/L — ABNORMAL LOW (ref 135–145)
Total Bilirubin: 1.8 mg/dL — ABNORMAL HIGH (ref 0.3–1.2)
Total Protein: 7.2 g/dL (ref 6.5–8.1)

## 2021-02-19 LAB — NA AND K (SODIUM & POTASSIUM), RAND UR
Potassium Urine: 11 mmol/L
Sodium, Ur: 27 mmol/L

## 2021-02-19 LAB — LACTIC ACID, PLASMA
Lactic Acid, Venous: 1.2 mmol/L (ref 0.5–1.9)
Lactic Acid, Venous: 0.9 mmol/L (ref 0.5–1.9)

## 2021-02-19 LAB — PROTIME-INR
INR: 1.1 (ref 0.8–1.2)
Prothrombin Time: 14 seconds (ref 11.4–15.2)

## 2021-02-19 LAB — MAGNESIUM
Magnesium: 1.4 mg/dL — ABNORMAL LOW (ref 1.7–2.4)
Magnesium: 1.6 mg/dL — ABNORMAL LOW (ref 1.7–2.4)

## 2021-02-19 LAB — APTT: aPTT: 33 seconds (ref 24–36)

## 2021-02-19 LAB — OSMOLALITY, URINE: Osmolality, Ur: 162 mOsm/kg — ABNORMAL LOW (ref 300–900)

## 2021-02-19 LAB — OSMOLALITY: Osmolality: 274 mOsm/kg — ABNORMAL LOW (ref 275–295)

## 2021-02-19 LAB — TSH: TSH: 4.371 u[IU]/mL (ref 0.350–4.500)

## 2021-02-19 LAB — HIV ANTIBODY (ROUTINE TESTING W REFLEX): HIV Screen 4th Generation wRfx: NONREACTIVE

## 2021-02-19 MED ORDER — SODIUM CHLORIDE 0.9 % IV SOLN: 2.0000 g | INTRAVENOUS | Status: DC

## 2021-02-19 MED ORDER — POTASSIUM CHLORIDE 20 MEQ PO PACK: 40.0000 meq | PACK | Freq: Once | ORAL | Status: DC

## 2021-02-19 MED ORDER — POTASSIUM CHLORIDE CRYS ER 20 MEQ PO TBCR
20.0000 meq | EXTENDED_RELEASE_TABLET | Freq: Two times a day (BID) | ORAL | 0 refills | Status: AC
Start: 2021-02-19 — End: 2021-02-26

## 2021-02-19 MED ORDER — MAGNESIUM SULFATE 4 GM/100ML IV SOLN
4.0000 g | Freq: Once | INTRAVENOUS | Status: AC
  Administered 2021-02-19: 11:00:00 4 g via INTRAVENOUS
  Filled 2021-02-19 (×4): qty 100

## 2021-02-19 MED ORDER — PANTOPRAZOLE SODIUM 40 MG PO TBEC: 40.0000 mg | DELAYED_RELEASE_TABLET | Freq: Every day | ORAL | 0 refills | Status: AC

## 2021-02-19 MED ORDER — TAMSULOSIN HCL 0.4 MG PO CAPS: 0.4000 mg | ORAL_CAPSULE | Freq: Every day | ORAL | Status: AC

## 2021-02-19 MED ORDER — MAGNESIUM 30 MG PO TABS: 30.0000 mg | ORAL_TABLET | Freq: Every day | ORAL | 0 refills | Status: AC

## 2021-02-19 MED ORDER — AMOXICILLIN-POT CLAVULANATE 875-125 MG PO TABS: 1.0000 | ORAL_TABLET | Freq: Two times a day (BID) | ORAL | 0 refills | Status: AC

## 2021-02-19 MED ORDER — POTASSIUM CHLORIDE CRYS ER 20 MEQ PO TBCR
20.0000 meq | EXTENDED_RELEASE_TABLET | Freq: Every day | ORAL | Status: DC
  Administered 2021-02-19: 11:00:00 20 meq via ORAL
  Filled 2021-02-19: qty 1

## 2021-02-19 MED ORDER — THIAMINE HCL 100 MG/ML IJ SOLN
Freq: Once | INTRAVENOUS | Status: AC
  Filled 2021-02-19: qty 1000

## 2021-02-19 MED ORDER — FOSFOMYCIN TROMETHAMINE 3 G PO PACK
3.0000 g | PACK | Freq: Once | ORAL | Status: AC
  Administered 2021-02-19: 11:00:00 3 g via ORAL
  Filled 2021-02-19: qty 3

## 2021-02-19 NOTE — TOC CM/SW Note (Signed)
Patient was active with [facility] prior to admission. They will continue home health services at discharge. Patient confirmed he will have a ride home. No further concerns. CSW signing off.  Dayton Scrape, [place]

## 2021-02-19 NOTE — Sepsis Progress Note (Addendum)
[facility] following Code Sepsis.  Confirmed with bedside RN, antibiotics were given after blood cultures were collected.

## 2021-02-19 NOTE — Discharge Summary (Addendum)
Physician Discharge Summary  Patient ID: Fernando [name] MRN: [id number] DOB/AGE: Dec 19, 1955 65 y.o.  Admit date: 02/18/2021 Discharge date: 02/19/2021  Admission Diagnoses:  Discharge Diagnoses:  Active Problems:   Thrombocytopenia (HCC)   Portal hypertensive gastropathy (HCC)   Malnutrition of moderate degree   Hypokalemia   Hypomagnesemia   Alcohol abuse   Hyponatremia   Dehydration   Discharged Condition: good  Hospital Course:  Fernando [name] is a 65 y.o. Caucasian male with medical history significant for Diastolic CHF, alcohol use disorder with history of DTs, alcoholic liver cirrhosis, hypertension and hospitalization from 9/15-9/21 with upper GI bleed with EGD showing esophagitis and nonbleeding gastric ulcers, as well as another hospitalization last month 10/17-10/31 for acute subdural hematoma secondary to fall and thrombocytopenia and coagulopathy, hypokalemia and hypomagnesemia with alcohol intoxication, who presents today to the ER with acute onset of recurrent falls with associated near syncope. Patient also have been having diarrhea for the past week. Upon arriving the hospital, patient was found to have dehydration with hyponatremia, hypokalemia and hypomagnesemia.  He received the fluids, he was also given magnesium and potassium. He feels much better today, he does not have a nausea vomiting.  He does not have any confusion.  No evidence of alcohol withdrawal.  He wished to go home. At this point, I will set up [facility] with PT/OT, RN and Education officer, museum.  Presyncope with recurrent falls.  Secondary to dehydration and alcohol drinking. Hyponatremia secondary to dehydration. Hypokalemia. Hypomagnesemia. Condition has improved, potassium 3.5 now.  I will continue 1 week of oral potassium supplement.  Also give 4 g of magnesium sulfate today, then 30 mg mag daily orally.  Patient be followed by PCP in the near future.  Enterococcus UTI Sepsis. Most of sepsis criteria  appear to be secondary to dehydration.  Reviewed previous urine culture, positive for Enterococcus. Patient will be given a dose of fosfomycin before discharge.  Patient will also be given Augmentin for 7 days.  Alcohol abuse disorder. Alcohol liver cirrhosis without ascites. Portal hypertension. 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 Currently, patient has no evidence of alcohol withdrawal.  Last drinking alcohol was 7 days ago, unlikely to develop any withdrawal at this time. I had a long discussion with patient and son and the patient and himself, patient long-term prognosis is very poor due to consistent alcohol drinking.  I encouraged him strongly to quit alcohol.  Chronic diastolic congestive heart failure. No exacerbation.  Due to dehydration due to recent diarrhea, will discontinue Lasix for now.  May restart after office visit with PCP.   Consults: None  Significant Diagnostic Studies:   Treatments: IVF, potassium magnesium  Discharge Exam: Blood pressure 113/64, pulse 69, temperature 98.6 F (37 C), temperature source Oral, resp. rate (!) 21, height 5\' 5"  (1.651 m), weight 68 kg, SpO2 100 %. General appearance: alert and cooperative Resp: clear to auscultation bilaterally Cardio: regular rate and rhythm, S1, S2 normal, no murmur, click, rub or gallop GI: soft, non-tender; bowel sounds normal; no masses,  no organomegaly Extremities: extremities normal, atraumatic, no cyanosis or edema  Disposition: Discharge disposition: 01-Home or Self Care      Discharge Instructions     Diet - low sodium heart healthy   Complete by: As directed    Increase activity slowly   Complete by: As directed       Allergies as of 02/19/2021       Reactions   Atorvastatin    Other reaction(s): joints ache  Doxycycline    Other reaction(s): rash/hive        Medication List     STOP taking these medications    cephALEXin 500 MG capsule Commonly known as:  KEFLEX   folic acid 1 MG tablet Commonly known as: FOLVITE   furosemide 40 MG tablet Commonly known as: LASIX       TAKE these medications    allopurinol 100 MG tablet Commonly known as: ZYLOPRIM Take 100 mg by mouth daily.   amoxicillin-clavulanate 875-125 MG tablet Commonly known as: Augmentin Take 1 tablet by mouth 2 (two) times daily for 7 days.   ipratropium-albuterol 0.5-2.5 (3) MG/3ML Soln Commonly known as: DUONEB Take 3 mLs by nebulization every 6 (six) hours as needed.   losartan 25 MG tablet Commonly known as: COZAAR Take 12.5 mg by mouth daily.   magnesium 30 MG tablet Take 1 tablet (30 mg total) by mouth daily.   pantoprazole 40 MG tablet Commonly known as: PROTONIX Take 1 tablet (40 mg total) by mouth daily.   potassium chloride SA 20 MEQ tablet Commonly known as: KLOR-CON Take 1 tablet (20 mEq total) by mouth 2 (two) times daily for 7 days.   tamsulosin 0.4 MG Caps capsule Commonly known as: FLOMAX Take 1 capsule (0.4 mg total) by mouth daily.        Follow-up Information     Avva, Ravisankar, MD Follow up in 1 week(s).   Specialty: Internal Medicine Contact information: [place] [address] [phone]         Buford Dresser, MD .   Specialty: Cardiology Contact information: [address] [place] [address] [phone]                 Signed: Sharen Hones 02/19/2021, 10:20 AM

## 2021-02-19 NOTE — Care Management CC44 (Signed)
Condition Code 44 Documentation Completed  Patient Details  Name: Fernando [name] MRN: [id number] Date of Birth: 1955/12/15   Condition Code 44 given:  Yes Patient signature on Condition Code 44 notice:  Yes Documentation of 2 MD's agreement:  Yes Code 44 added to claim:  Yes    Adelene Amas, [place] 02/19/2021, 10:31 AM

## 2021-02-19 NOTE — Care Management Obs Status (Signed)
[place] NOTIFICATION   Patient Details  Name: Fernando [name] MRN: [id number] Date of Birth: 1955/08/19   Medicare Observation Status Notification Given:  Yes    Adelene Amas, [place] 02/19/2021, 10:31 AM

## 2021-02-19 NOTE — Discharge Instructions (Signed)
Follow-up with your primary care physician in 1 week. Do not drink alcohol. Finish potassium prescription. Finish antibiotics previously prescribed.

## 2021-02-19 NOTE — Progress Notes (Signed)
IVs removed from patient. Discharge instructions given. Verbalized understanding. No acute distress at this time. Patient is awaiting ride from son.

## 2021-02-19 NOTE — Progress Notes (Signed)
CODE SEPSIS - PHARMACY COMMUNICATION  **Broad Spectrum Antibiotics should be administered within 1 hour of Sepsis diagnosis**  Time Code Sepsis Called/Page Received: 5170  Antibiotics Ordered: Ceftriaxone  Time of 1st antibiotic administration: 0056  Renda Rolls, PharmD, [facility] 02/19/2021 2:17 AM

## 2021-02-20 LAB — URINE CULTURE: Culture: >=100000 — AB

## 2021-02-23 ENCOUNTER — Other Ambulatory Visit: Payer: Self-pay | Admitting: Internal Medicine

## 2021-02-24 LAB — CULTURE, BLOOD (ROUTINE X 2)
Culture: NO GROWTH
Culture: NO GROWTH

## 2022-08-02 ENCOUNTER — Encounter: Payer: Self-pay | Admitting: Internal Medicine
